# Patient Record
Sex: Female | Born: 1940
Health system: Southern US, Community
[De-identification: ages and names within clinical notes are randomized; demographics above are authoritative.]

## PROBLEM LIST (undated history)

## (undated) DIAGNOSIS — K219 Gastro-esophageal reflux disease without esophagitis: Secondary | ICD-10-CM

## (undated) DIAGNOSIS — N182 Chronic kidney disease, stage 2 (mild): Secondary | ICD-10-CM

## (undated) DIAGNOSIS — E119 Type 2 diabetes mellitus without complications: Secondary | ICD-10-CM

## (undated) DIAGNOSIS — R42 Dizziness and giddiness: Secondary | ICD-10-CM

## (undated) DIAGNOSIS — T8450XA Infection and inflammatory reaction due to unspecified internal joint prosthesis, initial encounter: Secondary | ICD-10-CM

## (undated) DIAGNOSIS — E669 Obesity, unspecified: Secondary | ICD-10-CM

## (undated) DIAGNOSIS — Z8639 Personal history of other endocrine, nutritional and metabolic disease: Secondary | ICD-10-CM

## (undated) DIAGNOSIS — M1990 Unspecified osteoarthritis, unspecified site: Secondary | ICD-10-CM

## (undated) DIAGNOSIS — I10 Essential (primary) hypertension: Secondary | ICD-10-CM

## (undated) DIAGNOSIS — K210 Gastro-esophageal reflux disease with esophagitis: Secondary | ICD-10-CM

## (undated) DIAGNOSIS — E785 Hyperlipidemia, unspecified: Secondary | ICD-10-CM

## (undated) DIAGNOSIS — M810 Age-related osteoporosis without current pathological fracture: Secondary | ICD-10-CM

## (undated) HISTORY — PX: EYE SURGERY: SHX253

## (undated) HISTORY — PX: COLONOSCOPY: SHX174

## (undated) HISTORY — DX: Obesity, unspecified: E66.9

## (undated) HISTORY — PX: JOINT REPLACEMENT: SHX530

## (undated) HISTORY — DX: Dizziness and giddiness: R42

## (undated) HISTORY — DX: Essential (primary) hypertension: I10

## (undated) HISTORY — DX: Gastro-esophageal reflux disease with esophagitis: K21.0

## (undated) HISTORY — DX: Hyperlipidemia, unspecified: E78.5

---

## 1998-12-31 ENCOUNTER — Encounter: Payer: Self-pay | Admitting: Gastroenterology

## 1998-12-31 ENCOUNTER — Encounter: Admission: RE | Admit: 1998-12-31 | Discharge: 1998-12-31 | Payer: Self-pay | Admitting: Gastroenterology

## 1999-03-18 ENCOUNTER — Encounter: Admission: RE | Admit: 1999-03-18 | Discharge: 1999-03-18 | Payer: Self-pay | Admitting: Obstetrics and Gynecology

## 1999-03-18 ENCOUNTER — Encounter: Payer: Self-pay | Admitting: Obstetrics and Gynecology

## 1999-03-19 ENCOUNTER — Other Ambulatory Visit: Admission: RE | Admit: 1999-03-19 | Discharge: 1999-03-19 | Payer: Self-pay | Admitting: Obstetrics and Gynecology

## 2000-03-19 ENCOUNTER — Encounter: Admission: RE | Admit: 2000-03-19 | Discharge: 2000-03-19 | Payer: Self-pay | Admitting: Obstetrics and Gynecology

## 2000-03-19 ENCOUNTER — Encounter: Payer: Self-pay | Admitting: Obstetrics and Gynecology

## 2000-04-08 ENCOUNTER — Other Ambulatory Visit: Admission: RE | Admit: 2000-04-08 | Discharge: 2000-04-08 | Payer: Self-pay | Admitting: Obstetrics and Gynecology

## 2000-12-07 ENCOUNTER — Ambulatory Visit (HOSPITAL_BASED_OUTPATIENT_CLINIC_OR_DEPARTMENT_OTHER): Admission: RE | Admit: 2000-12-07 | Discharge: 2000-12-07 | Payer: Self-pay | Admitting: General Surgery

## 2001-03-22 ENCOUNTER — Encounter: Admission: RE | Admit: 2001-03-22 | Discharge: 2001-03-22 | Payer: Self-pay | Admitting: Obstetrics and Gynecology

## 2001-03-22 ENCOUNTER — Encounter: Payer: Self-pay | Admitting: Obstetrics and Gynecology

## 2001-04-12 ENCOUNTER — Other Ambulatory Visit: Admission: RE | Admit: 2001-04-12 | Discharge: 2001-04-12 | Payer: Self-pay | Admitting: Obstetrics and Gynecology

## 2001-05-04 ENCOUNTER — Encounter: Admission: RE | Admit: 2001-05-04 | Discharge: 2001-05-04 | Payer: Self-pay | Admitting: Obstetrics and Gynecology

## 2001-05-04 ENCOUNTER — Encounter: Payer: Self-pay | Admitting: Obstetrics and Gynecology

## 2001-07-23 ENCOUNTER — Encounter: Admission: RE | Admit: 2001-07-23 | Discharge: 2001-07-23 | Payer: Self-pay | Admitting: Endocrinology

## 2001-07-23 ENCOUNTER — Encounter: Payer: Self-pay | Admitting: Endocrinology

## 2001-12-15 ENCOUNTER — Encounter: Admission: RE | Admit: 2001-12-15 | Discharge: 2001-12-15 | Payer: Self-pay | Admitting: Endocrinology

## 2001-12-15 ENCOUNTER — Encounter: Payer: Self-pay | Admitting: Endocrinology

## 2002-02-14 ENCOUNTER — Ambulatory Visit (HOSPITAL_COMMUNITY): Admission: RE | Admit: 2002-02-14 | Discharge: 2002-02-14 | Payer: Self-pay | Admitting: Gastroenterology

## 2002-04-13 ENCOUNTER — Encounter: Payer: Self-pay | Admitting: Obstetrics and Gynecology

## 2002-04-13 ENCOUNTER — Encounter: Admission: RE | Admit: 2002-04-13 | Discharge: 2002-04-13 | Payer: Self-pay | Admitting: Obstetrics and Gynecology

## 2002-04-15 ENCOUNTER — Other Ambulatory Visit: Admission: RE | Admit: 2002-04-15 | Discharge: 2002-04-15 | Payer: Self-pay | Admitting: Obstetrics and Gynecology

## 2003-04-28 ENCOUNTER — Encounter: Admission: RE | Admit: 2003-04-28 | Discharge: 2003-04-28 | Payer: Self-pay | Admitting: Obstetrics and Gynecology

## 2003-05-08 ENCOUNTER — Other Ambulatory Visit: Admission: RE | Admit: 2003-05-08 | Discharge: 2003-05-08 | Payer: Self-pay | Admitting: Obstetrics and Gynecology

## 2003-11-09 ENCOUNTER — Encounter: Admission: RE | Admit: 2003-11-09 | Discharge: 2003-11-09 | Payer: Self-pay | Admitting: Gastroenterology

## 2004-02-20 ENCOUNTER — Encounter: Admission: RE | Admit: 2004-02-20 | Discharge: 2004-02-20 | Payer: Self-pay | Admitting: Family Medicine

## 2004-05-28 ENCOUNTER — Encounter: Admission: RE | Admit: 2004-05-28 | Discharge: 2004-05-28 | Payer: Self-pay | Admitting: Obstetrics and Gynecology

## 2004-10-08 ENCOUNTER — Encounter: Admission: RE | Admit: 2004-10-08 | Discharge: 2004-10-08 | Payer: Self-pay | Admitting: Obstetrics and Gynecology

## 2005-05-30 ENCOUNTER — Encounter: Admission: RE | Admit: 2005-05-30 | Discharge: 2005-05-30 | Payer: Self-pay | Admitting: Obstetrics and Gynecology

## 2006-06-17 ENCOUNTER — Encounter: Admission: RE | Admit: 2006-06-17 | Discharge: 2006-06-17 | Payer: Self-pay | Admitting: Obstetrics and Gynecology

## 2007-01-18 ENCOUNTER — Encounter: Admission: RE | Admit: 2007-01-18 | Discharge: 2007-01-18 | Payer: Self-pay | Admitting: Sports Medicine

## 2007-02-16 ENCOUNTER — Encounter: Admission: RE | Admit: 2007-02-16 | Discharge: 2007-02-16 | Payer: Self-pay | Admitting: Sports Medicine

## 2007-06-08 ENCOUNTER — Encounter: Admission: RE | Admit: 2007-06-08 | Discharge: 2007-06-08 | Payer: Self-pay | Admitting: Otolaryngology

## 2007-06-18 ENCOUNTER — Encounter: Admission: RE | Admit: 2007-06-18 | Discharge: 2007-06-18 | Payer: Self-pay | Admitting: Obstetrics and Gynecology

## 2007-07-27 ENCOUNTER — Observation Stay (HOSPITAL_COMMUNITY): Admission: EM | Admit: 2007-07-27 | Discharge: 2007-07-30 | Payer: Self-pay | Admitting: Emergency Medicine

## 2007-08-10 ENCOUNTER — Encounter: Admission: RE | Admit: 2007-08-10 | Discharge: 2007-09-06 | Payer: Self-pay | Admitting: Internal Medicine

## 2007-12-08 ENCOUNTER — Encounter: Admission: RE | Admit: 2007-12-08 | Discharge: 2007-12-29 | Payer: Self-pay | Admitting: Family Medicine

## 2008-01-14 ENCOUNTER — Encounter: Admission: RE | Admit: 2008-01-14 | Discharge: 2008-03-07 | Payer: Self-pay | Admitting: Family Medicine

## 2008-06-28 ENCOUNTER — Encounter: Admission: RE | Admit: 2008-06-28 | Discharge: 2008-06-28 | Payer: Self-pay | Admitting: Obstetrics and Gynecology

## 2009-07-05 ENCOUNTER — Encounter: Admission: RE | Admit: 2009-07-05 | Discharge: 2009-07-05 | Payer: Self-pay | Admitting: Obstetrics and Gynecology

## 2009-12-30 ENCOUNTER — Encounter: Admission: RE | Admit: 2009-12-30 | Discharge: 2009-12-30 | Payer: Self-pay | Admitting: Sports Medicine

## 2010-01-09 ENCOUNTER — Encounter: Admission: RE | Admit: 2010-01-09 | Discharge: 2010-01-09 | Payer: Self-pay | Admitting: Sports Medicine

## 2010-03-30 ENCOUNTER — Encounter: Payer: Self-pay | Admitting: Sports Medicine

## 2010-03-31 ENCOUNTER — Encounter: Payer: Self-pay | Admitting: Obstetrics and Gynecology

## 2010-03-31 ENCOUNTER — Encounter: Payer: Self-pay | Admitting: Family Medicine

## 2010-06-13 ENCOUNTER — Other Ambulatory Visit: Payer: Self-pay | Admitting: Obstetrics and Gynecology

## 2010-06-13 DIAGNOSIS — Z1231 Encounter for screening mammogram for malignant neoplasm of breast: Secondary | ICD-10-CM

## 2010-07-11 ENCOUNTER — Ambulatory Visit
Admission: RE | Admit: 2010-07-11 | Discharge: 2010-07-11 | Disposition: A | Discharge status: Home / Self Care | Payer: Medicare Other | Source: Ambulatory Visit | Attending: Obstetrics and Gynecology | Admitting: Obstetrics and Gynecology

## 2010-07-11 DIAGNOSIS — Z1231 Encounter for screening mammogram for malignant neoplasm of breast: Secondary | ICD-10-CM

## 2010-07-23 NOTE — H&P (Signed)
Tina Frank, Tina Frank                 ACCOUNT NO.:  [id number]   MEDICAL RECORD NO.:  [id number]          PATIENT TYPE:  EMS   LOCATION:  ED                           FACILITY:  [facility]   PHYSICIAN:  Hollice Espy, M.D.DATE OF BIRTH:  November 20, 1940   DATE OF ADMISSION:  07/27/2007  DATE OF DISCHARGE:                              HISTORY & PHYSICAL   PRIMARY CARE PHYSICIAN:  Chales Salmon. Abigail Miyamoto, M.D.   CHIEF COMPLAINT:  Vertigo.   HISTORY OF PRESENT ILLNESS:  The patient is a 69 year old white female  with past medical history of diabetes and hypertension who woke up this  morning with severe vertigo. She felt like every time she opened her  eyes her room was spinning. She could barely stand let alone walk and  her symptoms did not resolve. She called paramedics and was brought into  the emergency room. In the emergency room, the patient had a BMET done  which was found to be unremarkable. She underwent an MRI of the head  which had scattered white matter hyperintensities bilaterally most  likely due to chronic ischemia but no evidence of any acute  abnormalities. She was treated with multiple doses of diazepam, Antivert  and Zofran, but while this did help a little bit with her symptoms, she  still noted that she still had some vertigo especially when she looked  to the left. With continuance of her symptoms, it was felt best that she  come in for further observation. When I saw the patient, she was  complaining of some vertigo especially when she looked to the left. She  denied any headaches or no dysphagia. No chest pain, palpitations,  shortness of breath, wheeze, cough, no abdominal pain. No hematuria,  dysuria, constipation, diarrhea. No focal extremity numbness, weakness  or pain. Review of systems otherwise negative.   PAST MEDICAL HISTORY:  1. Diabetes.  2. Hypertension.  3. Hyperlipidemia.  4. Gastroesophageal reflux disease.  5. She also has a recent urinary tract  infection.   MEDICATIONS:  1. Cipro 500 b.i.d.  2. Nexium 40.  3. Multivitamin daily.  4. Vytorin 10/40.  5. Benicar 40/25.  6. Metformin 500.   SHE HAS ALLERGY TO SULFA.   SOCIAL HISTORY:  No tobacco, alcohol or drug use.   FAMILY HISTORY:  Noncontributory.   PHYSICAL EXAMINATION:  The patient's vitals on admission:  HEENT: Is normocephalic, atraumatic. Her mucous membranes are slightly  dry. Cranial nerves II-XII are intact. She does have a bit of a left-  sided horizontal nystagmus.  HEART: Regular rate and rhythm, S1, S2.  LUNGS:  Clear to auscultation bilaterally.  ABDOMEN: Soft and nontender, nondistended. Positive bowel sounds.  EXTREMITIES: Show no clubbing, cyanosis or edema.  I attempted to do one round of Dix-Hallpike maneuvers in attempts to  stabilize her vertigo, but this was unsuccessful.   LABORATORY DATA:  Sodium 132, potassium 3.8, chloride 100, bicarb not  done. BUN 19, creatinine 0.7, glucose 185.  MR of the brain shows  scattered white matter hyperintensities bilaterally most likely due to  chronic ischemia. No evidence  of any acute abnormalities.   ASSESSMENT/PLAN:  1. Intractable vertigo. Given the fact of onset of symptoms, normal      MRI, stable labs and vitals, it is likely she has benign positional      vertigo. The plan will be to observe the patient overnight, treat.      Given the fact that she cannot walk or stand on her own without      severe dizziness, will create instructions for the Epley and Semont      positions for vertigo and will have nursing staff treat with IV      Antivert and positioning multiple times until her symptoms have      resolved.  2. Diabetes mellitus.  Continue metformin and sliding scale.  3. Hypertension. Continue Benicar.  4. Gastroesophageal reflux disease. Continue Nexium.      Hollice Espy, M.D.  Electronically Signed     SKK/MEDQ  D:  07/27/2007  T:  07/27/2007  Job:  [id number]   cc:   Chales Salmon.  Abigail Miyamoto, M.D.  Fax: [phone]

## 2010-07-26 NOTE — Op Note (Signed)
   Tina Frank, Tina Frank                           ACCOUNT NO.:  [id number]   MEDICAL RECORD NO.:  [id number]                   PATIENT TYPE:  AMB   LOCATION:  ENDO                                 FACILITY:  [facility]   PHYSICIAN:  Petra Kuba, M.D.                 DATE OF BIRTH:  [birth year]   DATE OF PROCEDURE:  DATE OF DISCHARGE:                                 OPERATIVE REPORT   PROCEDURE:  Esophagogastroduodenoscopy.   INDICATIONS FOR PROCEDURE:  Upper tract symptoms.  Consent was signed after  risks, benefits,  methods and options were thoroughly discussed in the  office.   MEDICATIONS:  No additional medicines were used.   PROCEDURE IN DETAIL:  The video endoscope was inserted by direct vision.  The proximal, mid and distal esophagus were normal.  The distal esophagus  had a tiny to small hiatal hernia.  Scope passed easily in the stomach and  advanced to the antrum pertinent for some linear antritis.  Advanced through  a normal pylorus into a normal duodenal bulb and on to the cecum to a normal  second portion of the duodenum.  The scope was withdrawn back to the bulb  and a good look there ruled out ulcer in that location. The scope was  withdrawn back to the stomach and retroflexed.  The angularis, cardia and  fundus were normal except for the hiatal hernia being confirmed in the  cardia.  The lesser and greater curve were evaluated on retroflexion and  straight visualization with evidence of minimal gastritis.  No other  abnormalities were seen.  Air was suctioned.  Scope was slowly withdrawn.  Again, a good look at the esophagus was normal.  Scope was removed.  The  patient tolerated the procedure well.  There was no obvious immediate  complication.   ENDOSCOPIC DIAGNOSES:  1. Small hiatal hernia.  2. Minimal antritis/gastritis.  3. Otherwise normal esophagogastroduodenoscopy.   PLAN:  Continue pump inhibitors.  I will see back p.r.n., otherwise, see  colonoscopic  dictation for further for further workup, plans and future  screening.                                               Petra Kuba, M.D.    MEM/MEDQ  D:  02/11/2002  T:  02/12/2002  Job:  [id number]   cc:   Gregary Signs A. Everardo All, M.D. [facility]

## 2010-07-26 NOTE — Op Note (Signed)
   NAMEIZZABELLA, Tina Frank                           ACCOUNT NO.:  [id number]   MEDICAL RECORD NO.:  [id number]                   PATIENT TYPE:  AMB   LOCATION:  ENDO                                 FACILITY:  [facility]   PHYSICIAN:  Petra Kuba, M.D.                 DATE OF BIRTH:  November 05, 1940   DATE OF PROCEDURE:  02/11/2002  DATE OF DISCHARGE:                                 OPERATIVE REPORT   PROCEDURE:  Colonoscopy.   INDICATIONS:  Screening. Consent was signed after risks, benefits, methods,  and options were thoroughly discussed in the office. Consent was signed  prior to any premedications given after risks, benefits, methods, and  options were thoroughly discussed in the office.   MEDICATIONS GIVEN:  Demerol 70 mg, Versed 7.5 mg.   DESCRIPTION OF PROCEDURE:  Rectal inspection is pertinent for external  hemorrhoids, small. Digital exam was negative. The video pediatric  adjustable colonoscope was inserted and advanced around the colon to the  cecum. That required some left lower quadrant pressure but no position  changes. Other than left sided diverticula, no abnormalities were seen on  insertion. The cecum was identified by the appendiceal orifice and ileocecal  valve, in fact, the scope was inserted a short ways into the terminal ileum  which was normal. The scope was slowly withdrawn. The prep was adequate.  There was some liquid still that required washing and suctioning. On slow  withdrawn through the colon other than the left sided diverticula, no other  abnormalities were seen. Once back in the rectum, the scope was retroflexed,  pertinent for some internal hemorrhoids. The scope was straightened and  readvanced a short ways up the left side of the colon, air was suctioned and  the scope removed. The patient tolerated the procedure well. There was no  evidence of any complication.   ENDOSCOPIC DIAGNOSES:  1. Internal and external hemorrhoids.  2. Left sided  diverticula.  3. Otherwise within normal limits to the cecum and terminal ileum.   PLAN:  Continue workup with an EGD. Repeat colon screening 5 to 10 years.  Otherwise return to care of Dr. Everardo All. Further customary health care  measures to include yearly rectals and guaiacs.                                               Petra Kuba, M.D.    MEM/MEDQ  D:  02/11/2002  T:  02/11/2002  Job:  [id number]   cc:   Gregary Signs A. Everardo All, M.D. [facility]

## 2010-12-04 LAB — DIFFERENTIAL
Basophils Relative: 0
Eosinophils Absolute: 0.1
Lymphs Abs: 1.5
Monocytes Absolute: 0.7
Monocytes Relative: 8
Neutrophils Relative %: 74

## 2010-12-04 LAB — BASIC METABOLIC PANEL
BUN: 13
BUN: 14
CO2: 29
Calcium: 10.3
Chloride: 96
Creatinine, Ser: 0.92
Glucose, Bld: 115 — ABNORMAL HIGH
Glucose, Bld: 116 — ABNORMAL HIGH
Potassium: 4.3
Potassium: 4.5
Sodium: 136

## 2010-12-04 LAB — CBC
Hemoglobin: 14.4
MCHC: 33.5
MCV: 85.4
RBC: 5.05
WBC: 8.4

## 2010-12-04 LAB — POCT I-STAT, CHEM 8
BUN: 19
Chloride: 100
Creatinine, Ser: 0.7
Potassium: 3.8
Sodium: 132 — ABNORMAL LOW
TCO2: 20

## 2011-06-20 ENCOUNTER — Other Ambulatory Visit: Payer: Self-pay | Admitting: Obstetrics and Gynecology

## 2011-06-20 DIAGNOSIS — Z1231 Encounter for screening mammogram for malignant neoplasm of breast: Secondary | ICD-10-CM

## 2011-07-15 ENCOUNTER — Ambulatory Visit
Admission: RE | Admit: 2011-07-15 | Discharge: 2011-07-15 | Disposition: A | Discharge status: Home / Self Care | Payer: Medicare Other | Source: Ambulatory Visit | Attending: Obstetrics and Gynecology | Admitting: Obstetrics and Gynecology

## 2011-07-15 DIAGNOSIS — Z1231 Encounter for screening mammogram for malignant neoplasm of breast: Secondary | ICD-10-CM

## 2011-08-06 ENCOUNTER — Encounter: Payer: Self-pay | Admitting: *Deleted

## 2011-08-06 DIAGNOSIS — B029 Zoster without complications: Secondary | ICD-10-CM | POA: Insufficient documentation

## 2011-08-06 DIAGNOSIS — E669 Obesity, unspecified: Secondary | ICD-10-CM | POA: Insufficient documentation

## 2011-10-28 ENCOUNTER — Encounter (HOSPITAL_COMMUNITY): Payer: Self-pay | Admitting: *Deleted

## 2011-10-28 ENCOUNTER — Observation Stay (HOSPITAL_COMMUNITY)
Admission: EM | Admit: 2011-10-28 | Discharge: 2011-10-30 | Disposition: A | Discharge status: Home / Self Care | Payer: Medicare Other | Attending: Family Medicine | Admitting: Family Medicine

## 2011-10-28 DIAGNOSIS — I959 Hypotension, unspecified: Principal | ICD-10-CM | POA: Insufficient documentation

## 2011-10-28 DIAGNOSIS — E119 Type 2 diabetes mellitus without complications: Secondary | ICD-10-CM | POA: Diagnosis present

## 2011-10-28 DIAGNOSIS — R42 Dizziness and giddiness: Secondary | ICD-10-CM | POA: Insufficient documentation

## 2011-10-28 DIAGNOSIS — E669 Obesity, unspecified: Secondary | ICD-10-CM | POA: Insufficient documentation

## 2011-10-28 DIAGNOSIS — Z7982 Long term (current) use of aspirin: Secondary | ICD-10-CM | POA: Insufficient documentation

## 2011-10-28 DIAGNOSIS — I10 Essential (primary) hypertension: Secondary | ICD-10-CM | POA: Insufficient documentation

## 2011-10-28 DIAGNOSIS — Z79899 Other long term (current) drug therapy: Secondary | ICD-10-CM | POA: Insufficient documentation

## 2011-10-28 DIAGNOSIS — I952 Hypotension due to drugs: Secondary | ICD-10-CM

## 2011-10-28 DIAGNOSIS — E785 Hyperlipidemia, unspecified: Secondary | ICD-10-CM | POA: Diagnosis present

## 2011-10-28 DIAGNOSIS — T50904A Poisoning by unspecified drugs, medicaments and biological substances, undetermined, initial encounter: Secondary | ICD-10-CM

## 2011-10-28 DIAGNOSIS — I9589 Other hypotension: Secondary | ICD-10-CM

## 2011-10-28 DIAGNOSIS — B029 Zoster without complications: Secondary | ICD-10-CM

## 2011-10-28 LAB — CBC WITH DIFFERENTIAL/PLATELET
Eosinophils Absolute: 0.2 10*3/uL (ref 0.0–0.7)
Lymphs Abs: 1.5 10*3/uL (ref 0.7–4.0)
MCHC: 35.2 g/dL (ref 30.0–36.0)
MCV: 82.2 fL (ref 78.0–100.0)
Monocytes Relative: 11 % (ref 3–12)
Neutrophils Relative %: 58 % (ref 43–77)
Platelets: 195 10*3/uL (ref 150–400)
RDW: 12.8 % (ref 11.5–15.5)
WBC: 5.4 10*3/uL (ref 4.0–10.5)

## 2011-10-28 LAB — COMPREHENSIVE METABOLIC PANEL
Alkaline Phosphatase: 84 U/L (ref 39–117)
BUN: 16 mg/dL (ref 6–23)
Chloride: 100 mEq/L (ref 96–112)
GFR calc Af Amer: >90 mL/min (ref >90)
Glucose, Bld: 127 mg/dL — ABNORMAL HIGH (ref 70–99)
Potassium: 3.3 mEq/L — ABNORMAL LOW (ref 3.5–5.1)
Total Bilirubin: 0.6 mg/dL (ref 0.3–1.2)
Total Protein: 6.6 g/dL (ref 6.0–8.3)

## 2011-10-28 MED ORDER — POTASSIUM CHLORIDE CRYS ER 20 MEQ PO TBCR
40.0000 meq | EXTENDED_RELEASE_TABLET | Freq: Once | ORAL | Status: AC
  Administered 2011-10-28: 40 meq via ORAL
  Filled 2011-10-28: qty 2

## 2011-10-28 MED ORDER — AZITHROMYCIN 250 MG PO TABS
250.0000 mg | ORAL_TABLET | Freq: Every day | ORAL | Status: DC
  Administered 2011-10-29 – 2011-10-30 (×2): 250 mg via ORAL
  Filled 2011-10-28 (×2): qty 1

## 2011-10-28 MED ORDER — INSULIN ASPART 100 UNIT/ML ~~LOC~~ SOLN
0.0000 [IU] | Freq: Three times a day (TID) | SUBCUTANEOUS | Status: DC
  Administered 2011-10-29 – 2011-10-30 (×2): 2 [IU] via SUBCUTANEOUS

## 2011-10-28 MED ORDER — METFORMIN HCL 500 MG PO TABS
500.0000 mg | ORAL_TABLET | Freq: Every day | ORAL | Status: DC
  Administered 2011-10-29 – 2011-10-30 (×2): 500 mg via ORAL
  Filled 2011-10-28 (×3): qty 1

## 2011-10-28 MED ORDER — ASPIRIN 325 MG PO TABS
325.0000 mg | ORAL_TABLET | Freq: Every day | ORAL | Status: DC
  Administered 2011-10-29 – 2011-10-30 (×2): 325 mg via ORAL
  Filled 2011-10-28 (×2): qty 1

## 2011-10-28 MED ORDER — ATORVASTATIN CALCIUM 10 MG PO TABS
10.0000 mg | ORAL_TABLET | Freq: Every day | ORAL | Status: DC
  Administered 2011-10-29: 10 mg via ORAL
  Filled 2011-10-28 (×2): qty 1

## 2011-10-28 MED ORDER — CARVEDILOL 12.5 MG PO TABS
12.5000 mg | ORAL_TABLET | Freq: Two times a day (BID) | ORAL | Status: DC
Start: 2011-10-29 — End: 2011-10-29
  Administered 2011-10-29: 12.5 mg via ORAL
  Filled 2011-10-28 (×4): qty 1

## 2011-10-28 MED ORDER — ENOXAPARIN SODIUM 40 MG/0.4ML ~~LOC~~ SOLN
40.0000 mg | SUBCUTANEOUS | Status: DC
  Administered 2011-10-29 – 2011-10-30 (×2): 40 mg via SUBCUTANEOUS
  Filled 2011-10-28 (×2): qty 0.4

## 2011-10-28 MED ORDER — SODIUM CHLORIDE 0.9 % IV SOLN
INTRAVENOUS | Status: DC
  Administered 2011-10-29 – 2011-10-30 (×4): via INTRAVENOUS

## 2011-10-28 MED ORDER — SODIUM CHLORIDE 0.9 % IV BOLUS (SEPSIS)
500.0000 mL | Freq: Once | INTRAVENOUS | Status: AC
  Administered 2011-10-28: 500 mL via INTRAVENOUS

## 2011-10-28 MED ORDER — SODIUM CHLORIDE 0.9 % IJ SOLN: 3.0000 mL | Freq: Two times a day (BID) | INTRAMUSCULAR | Status: DC

## 2011-10-28 NOTE — ED Notes (Signed)
Pt has been having episodes of htn today, MD suggested she come to the ED, at 1715 took bp meds, on arrival bp 112/72

## 2011-10-28 NOTE — ED Notes (Signed)
Attempted to call report, awaiting rn to return call 

## 2011-10-28 NOTE — Progress Notes (Signed)
Attempted to call RN in ED for report, RN unable to answer phone. Will await callback.  Itali Mckendry, Ok Edwards

## 2011-10-28 NOTE — ED Notes (Signed)
MD at bedside.  hospitalist 

## 2011-10-28 NOTE — ED Provider Notes (Addendum)
History     CSN: [id number]  Arrival date & time 10/28/11  1739   First MD Initiated Contact with Patient 10/28/11 1852      Chief Complaint  Patient presents with  . Hypertension    HPI Pt has history of hypertension.  This past weekend she had sore throat, ear drainage and congestion so she was prescribed z pack by an UC doctor.  This am her BP was 185/85.  She took her regular medications as well as a clonidine that she takes prn.  Pt called her doctor because it was elevated at 184 and was told to get her kidney function checked.  Pt has not had to take an extra clonidine for a while.  No chest pain , no fever.  She has had some chills.  No vomiting or diarrhea.  She now feels lightheaded when she stands.  Past Medical History  Diagnosis Date  . Obesity   . Hypertension   . Diabetes mellitus   . Hyperlipidemia   . Reflux esophagitis   . Herpes zoster   . Shingles     History reviewed. No pertinent past surgical history.  Family History  Problem Relation Age of Onset  . Heart failure Mother   . Cancer Mother   . Cancer Father     History  Substance Use Topics  . Smoking status: Former Games developer  . Smokeless tobacco: Not on file  . Alcohol Use: No    OB History    Grav Para Term Preterm Abortions TAB SAB Ect Mult Living                  Review of Systems  Cardiovascular: Negative for chest pain.  All other systems reviewed and are negative.    Allergies  Crestor; Norvasc; Simvastatin; Zetia; and Sulfa antibiotics  Home Medications   Current Outpatient Rx  Name Route Sig Dispense Refill  . ASPIRIN 325 MG PO TABS Oral Take 325 mg by mouth daily.    . ATORVASTATIN CALCIUM 10 MG PO TABS Oral Take 10 mg by mouth daily.    . AZITHROMYCIN 250 MG PO TABS Oral Take 250-500 mg by mouth daily. Started 10/26/11, 5 day course of therapy.    Marland Kitchen CARVEDILOL 12.5 MG PO TABS Oral Take 12.5 mg by mouth 2 (two) times daily with a meal.    . CLONIDINE HCL 0.2 MG PO TABS Oral  Take 0.2 mg by mouth 2 (two) times daily as needed. qhs and can take an extra dose daily if needed for high blood pressure.    . FUROSEMIDE 40 MG PO TABS Oral Take 40 mg by mouth 2 (two) times daily.    Marland Kitchen HYDRALAZINE HCL 50 MG PO TABS Oral Take 50 mg by mouth 2 (two) times daily.    Marland Kitchen METFORMIN HCL 500 MG PO TABS Oral Take 500 mg by mouth daily.    . ADULT MULTIVITAMIN W/MINERALS CH Oral Take 1 tablet by mouth daily.    . TELMISARTAN 40 MG PO TABS Oral Take 40 mg by mouth 2 (two) times daily.       BP 81/47  Pulse 69  Temp 98.7 F (37.1 C) (Oral)  Resp 15  SpO2 95%  Physical Exam  Nursing note and vitals reviewed. Constitutional: She appears well-developed and well-nourished. No distress.  HENT:  Head: Normocephalic and atraumatic.  Right Ear: External ear normal.  Left Ear: External ear normal.  Eyes: Conjunctivae are normal. Right eye exhibits no  discharge. Left eye exhibits no discharge. No scleral icterus.  Neck: Neck supple. No tracheal deviation present.  Cardiovascular: Normal rate, regular rhythm and intact distal pulses.   Pulmonary/Chest: Effort normal and breath sounds normal. No stridor. No respiratory distress. She has no wheezes. She has no rales.  Abdominal: Soft. Bowel sounds are normal. She exhibits no distension. There is no tenderness. There is no rebound and no guarding.  Musculoskeletal: She exhibits no edema and no tenderness.  Neurological: She is alert. She has normal strength. No sensory deficit. Cranial nerve deficit:  no gross defecits noted. She exhibits normal muscle tone. She displays no seizure activity. Coordination normal.  Skin: Skin is warm and dry. No rash noted.  Psychiatric: She has a normal mood and affect.    ED Course  Procedures (including critical care time)  Rate: 53  Rhythm: normal sinus rhythm  QRS Axis: normal  Intervals: normal  ST/T Wave abnormalities: normal  Conduction Disutrbances:none  Narrative Interpretation:   Old EKG  Reviewed: none available  Labs Reviewed  COMPREHENSIVE METABOLIC PANEL - Abnormal; Notable for the following:    Potassium 3.3 (*)     Glucose, Bld 127 (*)     GFR calc non Af Amer 83 (*)     All other components within normal limits  CBC WITH DIFFERENTIAL      1. Hypotension due to drugs       MDM  At bedside, BP is 97/54.  Pt is asymptomatic.  Will monitor.  The patient's laboratory tests are unremarkable. She is asymptomatic and  clearly is currently not hypertensive but has a borderline lower blood pressure with the additional blood pressure doses that she's taken today.  Pt has remained orthostatic.  Likely related to her additional bp meds.  No other symptoms of chest pain, signs of infection or anemia, renal failure.  Will consult medicine for admission for observation.        Celene Kras, MD 10/28/11 2117  Celene Kras, MD 10/28/11 2220

## 2011-10-28 NOTE — ED Notes (Signed)
Family at bedside. 

## 2011-10-29 DIAGNOSIS — I9589 Other hypotension: Secondary | ICD-10-CM

## 2011-10-29 LAB — BASIC METABOLIC PANEL
Calcium: 9.8 mg/dL (ref 8.4–10.5)
Chloride: 104 mEq/L (ref 96–112)
Creatinine, Ser: 0.78 mg/dL (ref 0.50–1.10)
GFR calc Af Amer: >90 mL/min (ref >90)
Sodium: 137 mEq/L (ref 135–145)

## 2011-10-29 LAB — CBC
Platelets: 172 10*3/uL (ref 150–400)
RDW: 12.9 % (ref 11.5–15.5)
WBC: 6.3 10*3/uL (ref 4.0–10.5)

## 2011-10-29 LAB — GLUCOSE, CAPILLARY: Glucose-Capillary: 97 mg/dL (ref 70–99)

## 2011-10-29 MED ORDER — HYDRALAZINE HCL 50 MG PO TABS
50.0000 mg | ORAL_TABLET | Freq: Two times a day (BID) | ORAL | Status: DC
  Administered 2011-10-29 – 2011-10-30 (×2): 50 mg via ORAL
  Filled 2011-10-29 (×3): qty 1

## 2011-10-29 MED ORDER — CARVEDILOL 6.25 MG PO TABS
6.2500 mg | ORAL_TABLET | Freq: Two times a day (BID) | ORAL | Status: DC
  Filled 2011-10-29 (×4): qty 1

## 2011-10-29 MED ORDER — CLONIDINE HCL 0.1 MG PO TABS
0.1000 mg | ORAL_TABLET | Freq: Every day | ORAL | Status: DC
  Administered 2011-10-29: 0.1 mg via ORAL
  Filled 2011-10-29 (×2): qty 1

## 2011-10-29 NOTE — Progress Notes (Signed)
Upon arrival to the floor, pt's HR was sustaining in the 50's. ED RN and pt reported pt's HR had been in the 50's while there, occasionally dropping lower. No mention of this in Hospitalist's H&P. Through the night while pt resting HR sustained in the 40/50's occasionally dropping into the 30's. VSS. Asymptomatic. When pt awakened HR still remains in the 50's. Paged on call physician with above info. No new orders received. Will continue to monitor pt closely.   Arvel Oquinn, Ok Edwards RN

## 2011-10-29 NOTE — Progress Notes (Signed)
3:44 PM I agree with HPI/GPe and A/P per Dr. Conley Rolls       Patient has very difficult to control blood pressure and actually follows with nephrologist Dr. Briant Cedar. She states that for the past 2-3 nights she has been taking by mouth clonidine twice at night and that is what potentially precipitated her lightheadedness and presyncopal episodes. Her home medication regimen includes Coreg 12.5 twice a day with a meal Clonidine 0.2 mg 2 times daily as needed each bedtime and can take an extra dose if needed 5 blood pressure, hydralazine 50 mg twice a day Lasix 40 mg twice daily (this is a lower strength is swelling caused by the hydralazine) and candesartan 40 mg 2 tabs by mouth daily.  HEENT-alert oriented pleasant CHEST-clinically clear no added sound CARDIAC-S1-S2 no murmur rub or gallop-telemetry = sinus bradycardia into the 40s 50s overnight. Orthostatics were not ordered on admission ABDOMEN-soft nontender nondistended no rebound or guarding NEURO- no focal deficits noted, motor grossly intact SKIN/MUSCULAR  Patient Active Problem List  Diagnosis  . Obesity  . Reflux esophagitis  . Herpes zoster  . Shingles  . Hypotension (arterial)  . HTN (hypertension) with goal to be determined  . DM (diabetes mellitus)  . Hyperlipidemia   Severe orthostasis with possible supine hypertension orthostatic hypotension secondary to either #1 inch intake cardiogenic issues versus #2 polypharmacy-I am more inclined to think this is secondary to multiple medications. I have ordered orthostatic vital signs every shift, we will cut back her Coreg to 12.5 twice a day to 6.25 mg twice a day, will reimplement hydralazine 50 mg twice a day (first dose now) and will ? Order HCTZ in addition to a lower dose of her micardis  40 mg which probably change to 20 mg on discharge. I've discussed in detail with the patient that these medications need to be checked fine-tuned and it is not possible to do this as an outpatient  and would prefer not to given the fact that she still feels orthostatic and woozy when standing. Her blood sugars well controlled we will continue her other regular medications I will reimplement clonidine at 0.1 mg each bedtime scheduled She will need close outpatient followup with Dr. Briant Cedar, who I will contact if this recurs over the course of today and tomorrow  Pleas Koch, MD [facility] (P) [phone]

## 2011-10-29 NOTE — Progress Notes (Signed)
   CARE MANAGEMENT NOTE 10/29/2011  Patient:  Tina Frank, Tina Frank   Account Number:  [id number]  Date Initiated:  10/29/2011  Documentation initiated by:  Jiles Crocker  Subjective/Objective Assessment:   ADMITTED WITH DIZZINESS     Action/Plan:   PCP:   Gaye Alken, MD  LIVES ALONE, INDEPENDENT PRIOR TO ADMISSION   Anticipated DC Date:  10/29/2011   Anticipated DC Plan:  HOME/SELF CARE      DC Planning Services  CM consult          Status of service:  In process, will continue to follow Medicare Important Message given?  NA - LOS <3 / Initial given by admissions (If response is "NO", the following Medicare IM given date fields will be blank)  Per UR Regulation:  Reviewed for med. necessity/level of care/duration of stay  Comments:  10/29/2011- B Mamadou Breon RN, BSN, MHA

## 2011-10-29 NOTE — H&P (Signed)
[facility] History and Physical  Tina Frank ZOX:[id number] DOB: 31-May-1940    PCP:   Gaye Alken, MD   Chief Complaint: Dizziness  HPI: Tina Frank is an 71 y.o. female  With hx of severe labile HTN, on Coreg, Micardis, Hydralazine, and Clonodine 0.2 mg, DM, Hyperlipidemia, found her SBP to be 220, so she took an extra Clonidine pill as instructed by her MD as PRN use.  Subsequently, she felt lightheadedness and presents to the ER as recommended by her physician.  In the ER, her labs were rather unremarkable, but she has severe orthostasis with SBP 80 when standing up.  She also has lightheadedness.  EKG was SR with no ischemic changes.  After receiving IVF in the ER, and watch for 4 hours, hospitalist was asked to admit her for obs.  Other than the lightheadedness, she is asymptomatic.  Rewiew of Systems:  Constitutional: Negative for malaise, fever and chills. No significant weight loss or weight gain Eyes: Negative for eye pain, redness and discharge, diplopia, visual changes, or flashes of light. ENMT: Negative for ear pain, hoarseness, nasal congestion, sinus pressure and sore throat. No headaches; tinnitus, drooling, or problem swallowing. Cardiovascular: Negative for chest pain, palpitations, diaphoresis, dyspnea and peripheral edema. ; No orthopnea, PND Respiratory: Negative for cough, hemoptysis, wheezing and stridor. No pleuritic chestpain. Gastrointestinal: Negative for nausea, vomiting, diarrhea, constipation, abdominal pain, melena, blood in stool, hematemesis, jaundice and rectal bleeding.    Genitourinary: Negative for frequency, dysuria, incontinence,flank pain and hematuria; Musculoskeletal: Negative for back pain and neck pain. Negative for swelling and trauma.;  Skin: . Negative for pruritus, rash, abrasions, bruising and skin lesion.; ulcerations Neuro: Negative for headache,  and neck stiffness. Negative for weakness, altered level of consciousness  , altered mental status, extremity weakness, burning feet, involuntary movement, seizure and syncope.  Psych: negative for anxiety, depression, insomnia, tearfulness, panic attacks, hallucinations, paranoia, suicidal or homicidal ideation    Past Medical History  Diagnosis Date  . Obesity   . Hypertension   . Diabetes mellitus   . Hyperlipidemia   . Reflux esophagitis   . Herpes zoster   . Shingles     pt states she didn't have this on 10/28/11    Past Surgical History  Procedure Date  . No past surgeries     Medications:  HOME MEDS: Prior to Admission medications   Medication Sig Start Date End Date Taking? Authorizing Provider  aspirin 325 MG tablet Take 325 mg by mouth daily.   Yes Historical Provider, MD  atorvastatin (LIPITOR) 10 MG tablet Take 10 mg by mouth daily.   Yes Historical Provider, MD  azithromycin (ZITHROMAX) 250 MG tablet Take 250-500 mg by mouth daily. Started 10/26/11, 5 day course of therapy.   Yes Historical Provider, MD  carvedilol (COREG) 12.5 MG tablet Take 12.5 mg by mouth 2 (two) times daily with a meal.   Yes Historical Provider, MD  cloNIDine (CATAPRES) 0.2 MG tablet Take 0.2 mg by mouth 2 (two) times daily as needed. qhs and can take an extra dose daily if needed for high blood pressure.   Yes Historical Provider, MD  furosemide (LASIX) 40 MG tablet Take 40 mg by mouth 2 (two) times daily.   Yes Historical Provider, MD  hydrALAZINE (APRESOLINE) 50 MG tablet Take 50 mg by mouth 2 (two) times daily.   Yes Historical Provider, MD  metFORMIN (GLUCOPHAGE) 500 MG tablet Take 500 mg by mouth daily.   Yes Historical Provider, MD  Multiple Vitamin (MULTIVITAMIN WITH MINERALS) TABS Take 1 tablet by mouth daily.   Yes Historical Provider, MD  telmisartan (MICARDIS) 40 MG tablet Take 40 mg by mouth 2 (two) times daily.    Yes Historical Provider, MD     Allergies:  Allergies  Allergen Reactions  . Crestor (Rosuvastatin)     Feels "achey".  . Norvasc  (Amlodipine Besylate) Swelling  . Simvastatin   . Zetia (Ezetimibe)   . Sulfa Antibiotics Rash    Social History:   reports that she quit smoking about 24 years ago. She has never used smokeless tobacco. She reports that she does not drink alcohol or use illicit drugs.  Family History: Family History  Problem Relation Age of Onset  . Heart failure Mother   . Cancer Mother   . Cancer Father      Physical Exam: Filed Vitals:   10/28/11 2012 10/28/11 2015 10/28/11 2206 10/28/11 2348  BP: 97/52 85/50 141/71 176/78  Pulse: 62 58 51 54  Temp:    97.9 F (36.6 C)  TempSrc:    Oral  Resp:   16 18  Height:    5\' 5"  (1.651 m)  Weight:    69.083 kg (152 lb 4.8 oz)  SpO2:   100% 97%   Blood pressure 176/78, pulse 54, temperature 97.9 F (36.6 C), temperature source Oral, resp. rate 18, height 5\' 5"  (1.651 m), weight 69.083 kg (152 lb 4.8 oz), SpO2 97.00%.  GEN:  Pleasant patient lying in the stretcher in no acute distress; cooperative with exam. PSYCH:  alert and oriented x4; does not appear anxious or depressed; affect is appropriate. HEENT: Mucous membranes pink and anicteric; PERRLA; EOM intact; no cervical lymphadenopathy nor thyromegaly or carotid bruit; no JVD; There were no stridor. Neck is very supple. Breasts:: Not examined CHEST WALL: No tenderness CHEST: Normal respiration, clear to auscultation bilaterally.  HEART: Regular rate and rhythm.  There are no murmur, rub, or gallops.   BACK: No kyphosis or scoliosis; no CVA tenderness ABDOMEN: soft and non-tender; no masses, no organomegaly, normal abdominal bowel sounds; no pannus; no intertriginous candida. There is no rebound and no distention. Rectal Exam: Not done EXTREMITIES: No bone or joint deformity; age-appropriate arthropathy of the hands and knees; no edema; no ulcerations.  There is no calf tenderness. Genitalia: not examined PULSES: 2+ and symmetric SKIN: Normal hydration no rash or ulceration CNS: Cranial  nerves 2-12 grossly intact no focal lateralizing neurologic deficit.  Speech is fluent; uvula elevated with phonation, facial symmetry and tongue midline. DTR are normal bilaterally, cerebella exam is intact, barbinski is negative and strengths are equaled bilaterally.  No sensory loss.   Labs on Admission:  Basic Metabolic Panel:  Lab 10/28/11 8295  NA 135  K 3.3*  CL 100  CO2 25  GLUCOSE 127*  BUN 16  CREATININE 0.74  CALCIUM 10.1  MG --  PHOS --   Liver Function Tests:  Lab 10/28/11 1845  AST 15  ALT 11  ALKPHOS 84  BILITOT 0.6  PROT 6.6  ALBUMIN 3.6   No results found for this basename: LIPASE:5,AMYLASE:5 in the last 168 hours No results found for this basename: AMMONIA:5 in the last 168 hours CBC:  Lab 10/28/11 1845  WBC 5.4  NEUTROABS 3.2  HGB 14.1  HCT 40.1  MCV 82.2  PLT 195   Cardiac Enzymes: No results found for this basename: CKTOTAL:5,CKMB:5,CKMBINDEX:5,TROPONINI:5 in the last 168 hours  CBG:  Lab 10/29/11 0004  GLUCAP  112*     Radiological Exams on Admission: No results found.  EKG: Independently reviewed. SR with no acute ST-T changes.   Assessment/Plan Present on Admission:  .Hypotension (arterial) .HTN (hypertension) with goal to be determined .DM (diabetes mellitus) .Hyperlipidemia   PLAN:  Will admit for obs.  I have held most of her HTN except for the Coreg.  Expect severe rebound HTN in the early morning.  If her SBP is greater than 180, we should resume her HTN in a progressive manner.  Her HTN is labile, and has been difficult to control.  She also has allery to Norvasc and has skin reaction to transdermal clonidine.  We will just have to see how her BP is doing.  For her DM, will continue her Metformin and use SSI as necessary.  She is stable, full code, and will be admitted OBS under [facility] service. Plan discussed with her and her son at her bedside.  They both agreed.  Other plans as per orders.  Code Status: FULL  Unk Lightning, MD. [facility] Pager [phone] 7pm to 7am.  10/29/2011, 12:17 AM

## 2011-10-29 NOTE — Progress Notes (Signed)
Patients heart rate is 58 (radial pulse). Coreg scheduled at 1700. MD notified and plans are to hold this dose. Will continue to monitor patient. Setzer, Don Broach

## 2011-10-30 DIAGNOSIS — E119 Type 2 diabetes mellitus without complications: Secondary | ICD-10-CM

## 2011-10-30 DIAGNOSIS — I959 Hypotension, unspecified: Secondary | ICD-10-CM

## 2011-10-30 LAB — GLUCOSE, CAPILLARY: Glucose-Capillary: 153 mg/dL — ABNORMAL HIGH (ref 70–99)

## 2011-10-30 MED ORDER — HYDROCHLOROTHIAZIDE 12.5 MG PO CAPS: 12.5000 mg | ORAL_CAPSULE | Freq: Every day | ORAL | Status: DC

## 2011-10-30 MED ORDER — HYDROCHLOROTHIAZIDE 12.5 MG PO CAPS
12.5000 mg | ORAL_CAPSULE | Freq: Every day | ORAL | Status: DC
  Administered 2011-10-30: 12.5 mg via ORAL
  Filled 2011-10-30: qty 1

## 2011-10-30 MED ORDER — CLONIDINE HCL 0.1 MG PO TABS: 0.1000 mg | ORAL_TABLET | Freq: Every day | ORAL | Status: AC

## 2011-10-30 MED ORDER — HYDRALAZINE HCL 50 MG PO TABS
50.0000 mg | ORAL_TABLET | Freq: Once | ORAL | Status: AC
  Administered 2011-10-30: 50 mg via ORAL
  Filled 2011-10-30: qty 1

## 2011-10-30 NOTE — Progress Notes (Signed)
Patient discharge, alert and oriented, no complaints of any pain or discomfort, son at bedside. PIV removed no s/s of infiltration or swelling. D/C instructions and follow up appointment  done and given to the patient , verbalized understanding.

## 2011-10-30 NOTE — Discharge Summary (Signed)
Physician Discharge Summary  Tina Frank JXB:[id number] DOB: [birth year] DOA: 10/28/2011  PCP: Gaye Alken, MD  Admit date: 10/28/2011 Discharge date: 10/30/2011  Recommendations for Outpatient Follow-up:  1. Needs follow-up c Dr. Briant Cedar for HTN issues-would prefer blood pressure to be slightly higher than 150/90 given her advanced age 71. Outpatient reassessment of medications as well as functional status  Discharge Diagnoses:  Principal Problem:  *Hypotension (arterial) Active Problems:  HTN (hypertension) with goal to be determined  DM (diabetes mellitus)  Hyperlipidemia   Discharge Condition: Good  Diet recommendation: Heart healthy low card diabetic  Filed Weights   10/28/11 2348 10/30/11 0532  Weight: 69.083 kg (152 lb 4.8 oz) 72.4 kg (159 lb 9.8 oz)    History of present illness:  This pleasant 71 year old female with history of severe labile hypertension on multiple medications presented to the emergency room 10/29/2011 with lightheadedness and severe orthostasis.  She had been on Coreg my card is hydralazine and clonidine 0.2 mg and had been instructed by her nephrologist take an extra clonidine at night if her blood pressure was high.  She was ruled out for acute coronary syndrome and was kept on observation status initially.  Her blood pressure after fine-tuning and adjustment of her blood pressure medications when slightly higher.  I did counsel her that given her increased age her actual JNC goal is not below 140/90 as would be for regular adults rather it would be slightly higher and I would even venture above 160/104 increased cerebral/cardiac perfusion  she did not experience any further orthostatic symptoms or findings.  She ambulated about 400 feet prior to discharge and did not have any oxygen requirements.  Patient will need close followup and outpatient setting to prevent polypharmacy and 4 further fine-tuning of her medications.   Discharge  Exam: Filed Vitals:   10/30/11 1357  BP: 162/77  Pulse: 60  Temp:   Resp:    Filed Vitals:   10/30/11 1215 10/30/11 1355 10/30/11 1356 10/30/11 1357  BP: 158/71 154/75 182/71 162/77  Pulse: 61 68 55 60  Temp: 97.9 F (36.6 C)     TempSrc: Oral     Resp: 16     Height:      Weight:      SpO2: 97%       HEENT-alert oriented pleasant  CHEST-clinically clear no added sound  CARDIAC-S1-S2 no murmur rub or gallop-telemetry = sinus bradycardia into the 40s 50s overnight.  ABDOMEN-soft nontender nondistended no rebound or guarding  NEURO- no focal deficits noted, motor grossly intact  SKIN/MUSCULAR   Discharge Instructions  Discharge Orders    Future Orders Please Complete By Expires   Diet - low sodium heart healthy      Increase activity slowly      Call MD for:      Call MD for:  persistant nausea and vomiting      Call MD for:  redness, tenderness, or signs of infection (pain, swelling, redness, odor or green/yellow discharge around incision site)      Call MD for:  hives      Call MD for:  extreme fatigue        Medication List  As of 10/30/2011  2:16 PM   STOP taking these medications         furosemide 40 MG tablet      telmisartan 40 MG tablet         TAKE these medications  aspirin 325 MG tablet   Take 325 mg by mouth daily.      atorvastatin 10 MG tablet   Commonly known as: LIPITOR   Take 10 mg by mouth daily.      azithromycin 250 MG tablet   Commonly known as: ZITHROMAX   Take 250-500 mg by mouth daily. Started 10/26/11, 5 day course of therapy.      carvedilol 12.5 MG tablet   Commonly known as: COREG   Take 12.5 mg by mouth 2 (two) times daily with a meal.      cloNIDine 0.1 MG tablet   Commonly known as: CATAPRES   Take 1 tablet (0.1 mg total) by mouth at bedtime.      hydrALAZINE 50 MG tablet   Commonly known as: APRESOLINE   Take 50 mg by mouth 2 (two) times daily.      hydrochlorothiazide 12.5 MG capsule   Commonly known as:  MICROZIDE   Take 1 capsule (12.5 mg total) by mouth daily.      metFORMIN 500 MG tablet   Commonly known as: GLUCOPHAGE   Take 500 mg by mouth daily.      multivitamin with minerals Tabs   Take 1 tablet by mouth daily.           Follow-up Information    Call Gaye Alken, MD.   Contact information:   [address] [place] [address] [phone]       Follow up with Dyke Maes, MD. Schedule an appointment as soon as possible for a visit in 2 weeks.   Contact information:   [address] [place] [address] [phone]           The results of significant diagnostics from this hospitalization (including imaging, microbiology, ancillary and laboratory) are listed below for reference.    Significant Diagnostic Studies: No results found.  Microbiology: No results found for this or any previous visit (from the past 240 hour(s)).   Labs: Basic Metabolic Panel:  Lab 10/29/11 5284 10/28/11 1845  NA 137 135  K 3.8 3.3*  CL 104 100  CO2 24 25  GLUCOSE 107* 127*  BUN 16 16  CREATININE 0.78 0.74  CALCIUM 9.8 10.1  MG -- --  PHOS -- --   Liver Function Tests:  Lab 10/28/11 1845  AST 15  ALT 11  ALKPHOS 84  BILITOT 0.6  PROT 6.6  ALBUMIN 3.6   No results found for this basename: LIPASE:5,AMYLASE:5 in the last 168 hours No results found for this basename: AMMONIA:5 in the last 168 hours CBC:  Lab 10/29/11 0453 10/28/11 1845  WBC 6.3 5.4  NEUTROABS -- 3.2  HGB 12.9 14.1  HCT 38.1 40.1  MCV 83.6 82.2  PLT 172 195   Cardiac Enzymes: No results found for this basename: CKTOTAL:5,CKMB:5,CKMBINDEX:5,TROPONINI:5 in the last 168 hours BNP: BNP (last 3 results) No results found for this basename: PROBNP:3 in the last 8760 hours CBG:  Lab 10/30/11 1147 10/30/11 0803 10/29/11 2141 10/29/11 1654 10/29/11 1156  GLUCAP 75 153* 97 102* 117*    Time coordinating discharge: 23 minutes  Signed:  Rhetta Mura  [facility] 10/30/2011, 2:16 PM

## 2012-05-21 ENCOUNTER — Other Ambulatory Visit: Payer: Self-pay

## 2012-05-21 DIAGNOSIS — Z1231 Encounter for screening mammogram for malignant neoplasm of breast: Secondary | ICD-10-CM

## 2012-06-07 ENCOUNTER — Other Ambulatory Visit: Payer: Self-pay | Admitting: Gastroenterology

## 2012-06-07 DIAGNOSIS — R109 Unspecified abdominal pain: Secondary | ICD-10-CM

## 2012-06-10 ENCOUNTER — Ambulatory Visit
Admission: RE | Admit: 2012-06-10 | Discharge: 2012-06-10 | Disposition: A | Discharge status: Home / Self Care | Payer: Medicare Other | Source: Ambulatory Visit | Attending: Gastroenterology | Admitting: Gastroenterology

## 2012-06-10 DIAGNOSIS — R109 Unspecified abdominal pain: Secondary | ICD-10-CM

## 2012-06-10 MED ORDER — IOHEXOL 300 MG/ML  SOLN
100.0000 mL | Freq: Once | INTRAMUSCULAR | Status: AC | PRN
  Administered 2012-06-10: 100 mL via INTRAVENOUS

## 2012-07-06 ENCOUNTER — Other Ambulatory Visit (HOSPITAL_COMMUNITY): Payer: Self-pay | Admitting: Urology

## 2012-07-06 DIAGNOSIS — N289 Disorder of kidney and ureter, unspecified: Secondary | ICD-10-CM

## 2012-07-14 ENCOUNTER — Ambulatory Visit (HOSPITAL_COMMUNITY)
Admission: RE | Admit: 2012-07-14 | Discharge: 2012-07-14 | Disposition: A | Discharge status: Home / Self Care | Payer: Medicare Other | Source: Ambulatory Visit | Attending: Urology | Admitting: Urology

## 2012-07-14 DIAGNOSIS — D3500 Benign neoplasm of unspecified adrenal gland: Secondary | ICD-10-CM | POA: Insufficient documentation

## 2012-07-14 DIAGNOSIS — N281 Cyst of kidney, acquired: Secondary | ICD-10-CM | POA: Insufficient documentation

## 2012-07-14 DIAGNOSIS — N289 Disorder of kidney and ureter, unspecified: Secondary | ICD-10-CM

## 2012-07-14 MED ORDER — GADOBENATE DIMEGLUMINE 529 MG/ML IV SOLN
15.0000 mL | Freq: Once | INTRAVENOUS | Status: AC | PRN
  Administered 2012-07-14: 14 mL via INTRAVENOUS

## 2012-07-15 ENCOUNTER — Ambulatory Visit: Payer: Medicare Other

## 2012-08-16 ENCOUNTER — Ambulatory Visit
Admission: RE | Admit: 2012-08-16 | Discharge: 2012-08-16 | Disposition: A | Discharge status: Home / Self Care | Payer: Medicare Other | Source: Ambulatory Visit

## 2012-08-16 DIAGNOSIS — Z1231 Encounter for screening mammogram for malignant neoplasm of breast: Secondary | ICD-10-CM

## 2012-10-06 ENCOUNTER — Other Ambulatory Visit: Payer: Self-pay | Admitting: Internal Medicine

## 2012-10-06 DIAGNOSIS — E210 Primary hyperparathyroidism: Secondary | ICD-10-CM

## 2012-10-12 ENCOUNTER — Encounter (HOSPITAL_COMMUNITY)
Admission: RE | Admit: 2012-10-12 | Discharge: 2012-10-12 | Disposition: A | Discharge status: Home / Self Care | Payer: Medicare Other | Source: Ambulatory Visit | Attending: Internal Medicine | Admitting: Internal Medicine

## 2012-10-12 DIAGNOSIS — E210 Primary hyperparathyroidism: Secondary | ICD-10-CM | POA: Insufficient documentation

## 2012-10-12 MED ORDER — TECHNETIUM TC 99M SESTAMIBI GENERIC - CARDIOLITE
25.6000 | Freq: Once | INTRAVENOUS | Status: AC | PRN
  Administered 2012-10-12: 26 via INTRAVENOUS

## 2012-10-29 ENCOUNTER — Encounter (INDEPENDENT_AMBULATORY_CARE_PROVIDER_SITE_OTHER): Payer: Self-pay | Admitting: Surgery

## 2012-10-29 ENCOUNTER — Ambulatory Visit (INDEPENDENT_AMBULATORY_CARE_PROVIDER_SITE_OTHER): Payer: Medicare Other | Admitting: Surgery

## 2012-10-29 VITALS — BP 164/84 | HR 78 | Temp 98.6°F | Resp 18 | Ht 64.0 in | Wt 153.0 lb

## 2012-10-29 DIAGNOSIS — E210 Primary hyperparathyroidism: Secondary | ICD-10-CM | POA: Insufficient documentation

## 2012-10-29 NOTE — Progress Notes (Signed)
General Surgery [facility]  Chief Complaint  Patient presents with  . New Evaluation    primary hyperparathyroidism - referral from Dr. Debara Pickett    HISTORY: The patient is a 72 year old female referred by her endocrinologist for management of primary hyperparathyroidism. Patient has apparently had borderline elevated serum calcium levels for a number of years. She notes chronic fatigue, hypertension which has sometimes been difficult to control, large joint discomfort, and a recent diagnosis of osteopenia by bone density scanning. She denies nephrolithiasis. Evaluation shows serum calcium levels of 10.5 and 10.6. Intact PTH level is 35. Nuclear medicine parathyroid scan was performed on 10/12/2012. This localizes a parathyroid adenoma to the right inferior position.  Patient has no prior history of endocrine disease. She has had no prior head or neck surgery. There is no family history of endocrine neoplasm. Patient did undergo workup for benign adrenal adenoma. Testing for pheochromocytoma was reportedly negative.  Past Medical History  Diagnosis Date  . Obesity   . Hypertension   . Diabetes mellitus   . Hyperlipidemia   . Reflux esophagitis   . Herpes zoster   . Shingles     pt states she didn't have this on 10/28/11    Current Outpatient Prescriptions  Medication Sig Dispense Refill  . aspirin 325 MG tablet Take 325 mg by mouth daily.      Marland Kitchen atorvastatin (LIPITOR) 10 MG tablet Take 10 mg by mouth daily.      . carvedilol (COREG) 12.5 MG tablet Take 12.5 mg by mouth 2 (two) times daily with a meal.      . cloNIDine (CATAPRES) 0.1 MG tablet Take 1 tablet (0.1 mg total) by mouth at bedtime.  60 tablet  0  . furosemide (LASIX) 20 MG tablet Take 20 mg by mouth 2 (two) times daily.      . hydrALAZINE (APRESOLINE) 50 MG tablet Take 50 mg by mouth 2 (two) times daily.      . metFORMIN (GLUCOPHAGE) 500 MG tablet Take 500 mg by mouth daily.      . Multiple Vitamin  (MULTIVITAMIN WITH MINERALS) TABS Take 1 tablet by mouth daily.      . hydrochlorothiazide (MICROZIDE) 12.5 MG capsule Take 1 capsule (12.5 mg total) by mouth daily.  30 capsule  0   No current facility-administered medications for this visit.    Allergies  Allergen Reactions  . Crestor [Rosuvastatin]     Feels "achey".  . Norvasc [Amlodipine Besylate] Swelling  . Simvastatin   . Zetia [Ezetimibe]   . Sulfa Antibiotics Rash    Family History  Problem Relation Age of Onset  . Heart failure Mother   . Cancer Mother   . Cancer Father     History   Social History  . Marital Status: Widowed    Spouse Name: N/A    Number of Children: N/A  . Years of Education: N/A   Social History Main Topics  . Smoking status: Former Smoker -- 20 years    Quit date: 10/28/1987  . Smokeless tobacco: Never Used  . Alcohol Use: No  . Drug Use: No  . Sexual Activity: None   Other Topics Concern  . None   Social History Narrative  . None    REVIEW OF SYSTEMS - PERTINENT POSITIVES ONLY: Denies tremor. Denies palpitations. Denies compressive symptoms. Denies nephrolithiasis. Positive for chronic fatigue, osteopenia, bone and joint discomfort, and fluctuating hypertension.  EXAMCeasar Mons Vitals:   10/29/12 1330  BP: 164/84  Pulse: 78  Temp: 98.6 F (37 C)  Resp: 18    HEENT: normocephalic; pupils equal and reactive; sclerae clear; dentition good; mucous membranes moist NECK:  No palpable nodules in the thyroid bed; symmetric on extension; no palpable anterior or posterior cervical lymphadenopathy; no supraclavicular masses; no tenderness CHEST: clear to auscultation bilaterally without rales, rhonchi, or wheezes CARDIAC: regular rate and rhythm without significant murmur; peripheral pulses are full EXT:  non-tender without edema; no deformity NEURO: no gross focal deficits; no sign of tremor   LABORATORY RESULTS: See [facility] ([facility]L-Epic) for most recent results  RADIOLOGY  RESULTS: See [facility] (CHL-Epic) for most recent results  IMPRESSION: Primary hyperparathyroidism, likely right inferior parathyroid adenoma  PLAN: I discussed the above findings at length with the patient and her son. While her intact PTH level is not elevated, it is inappropriately normal given the fact that her calcium levels are mildly elevated. The nuclear medicine scan is positive for right inferior adenoma. I believe she is a good candidate for minimally invasive parathyroidectomy.  We discussed the procedure. We discussed the hospital stay. We discussed her postoperative recovery and returned activity. We discussed the possibility of a second gland adenoma and potential for second surgery. They understand and wish to proceed. We will make arrangements for surgery in the near future at a time convenient for the patient and her family.  The risks and benefits of the procedure have been discussed at length with the patient.  The patient understands the proposed procedure, potential alternative treatments, and the course of recovery to be expected.  All of the patient's questions have been answered at this time.  The patient wishes to proceed with surgery.  Velora Heckler, MD, FACS General & Endocrine Surgery [facility]  Primary Care Physician: Neldon Labella, MD

## 2012-10-29 NOTE — Patient Instructions (Signed)

## 2012-11-15 ENCOUNTER — Encounter (INDEPENDENT_AMBULATORY_CARE_PROVIDER_SITE_OTHER): Payer: Self-pay

## 2012-11-22 ENCOUNTER — Encounter (HOSPITAL_COMMUNITY): Payer: Self-pay | Admitting: Pharmacy Technician

## 2012-11-25 ENCOUNTER — Encounter (HOSPITAL_COMMUNITY): Payer: Self-pay

## 2012-11-25 ENCOUNTER — Encounter (HOSPITAL_COMMUNITY)
Admission: RE | Admit: 2012-11-25 | Discharge: 2012-11-25 | Disposition: A | Discharge status: Home / Self Care | Payer: Medicare Other | Source: Ambulatory Visit | Attending: Surgery | Admitting: Surgery

## 2012-11-25 DIAGNOSIS — Z01818 Encounter for other preprocedural examination: Secondary | ICD-10-CM | POA: Insufficient documentation

## 2012-11-25 DIAGNOSIS — Z01812 Encounter for preprocedural laboratory examination: Secondary | ICD-10-CM | POA: Insufficient documentation

## 2012-11-25 LAB — BASIC METABOLIC PANEL
BUN: 12 mg/dL (ref 6–23)
Calcium: 10.7 mg/dL — ABNORMAL HIGH (ref 8.4–10.5)
GFR calc Af Amer: >90 mL/min (ref >90)
GFR calc non Af Amer: 84 mL/min — ABNORMAL LOW (ref >90)
Glucose, Bld: 120 mg/dL — ABNORMAL HIGH (ref 70–99)
Sodium: 138 mEq/L (ref 135–145)

## 2012-11-25 LAB — CBC
MCH: 28.5 pg (ref 26.0–34.0)
MCHC: 33.8 g/dL (ref 30.0–36.0)
Platelets: 223 10*3/uL (ref 150–400)
RBC: 5.05 MIL/uL (ref 3.87–5.11)

## 2012-11-25 NOTE — Patient Instructions (Addendum)
20 Tina Frank  11/25/2012   Your procedure is scheduled on: 9-25   -2014  Report to [facility] at    0530    AM   Call this number if you have problems the morning of surgery: [phone]  Or Presurgical Testing [phone](Mackinze Criado)      Do not eat food:After Midnight.    Take these medicines the morning of surgery with A SIP OF WATER: Tylenol. Carvedilol.   Do not wear jewelry, make-up or nail polish.  Do not wear lotions, powders, or perfumes. You may wear deodorant.  Do not shave 12 hours prior to first CHG shower(legs and under arms).(face and neck okay.)  Do not bring valuables to the hospital.  Contacts, dentures or bridgework,body piercing,  may not be worn into surgery.  Leave suitcase in the car. After surgery it may be brought to your room.  For patients admitted to the hospital, checkout time is 11:00 AM the day of discharge.   Patients discharged the day of surgery will not be allowed to drive home. Must have responsible person with you x 24 hours once discharged.  Name and phone number of your driver:Stephen Ikeda-son [phone] cell   Special Instructions: CHG(Chlorhedine 4%-"Hibiclens","Betasept","Aplicare") Shower Use Special Wash: see special instructions.(avoid face and genitals)       Failure to follow these instructions may result in Cancellation of your surgery.   Patient signature_______________________________________________________

## 2012-11-25 NOTE — Pre-Procedure Instructions (Signed)
11-25-12 EKG/ CXR 2'14 -Epic

## 2012-11-30 NOTE — Progress Notes (Signed)
Quick Note:  These results are acceptable for scheduled surgery.  Lavera Vandermeer M. Briea Mcenery, MD, FACS [facility] Office: [phone]   ______ 

## 2012-12-01 NOTE — Anesthesia Preprocedure Evaluation (Addendum)
Anesthesia Evaluation  Patient identified by MRN, date of birth, ID band Patient awake    Reviewed: Allergy & Precautions, H&P , NPO status , Patient's Chart, lab work & pertinent test results, reviewed documented beta blocker date and time   Airway Mallampati: II TM Distance: >3 FB Neck ROM: full    Dental  (+) Caps and Dental Advisory Given All upper front are capped (bridge):   Pulmonary neg pulmonary ROS,  breath sounds clear to auscultation  Pulmonary exam normal       Cardiovascular Exercise Tolerance: Good hypertension, Pt. on medications and Pt. on home beta blockers Rhythm:regular Rate:Normal     Neuro/Psych negative neurological ROS  negative psych ROS   GI/Hepatic negative GI ROS, Neg liver ROS,   Endo/Other  diabetes, Well Controlled, Type 2, Oral Hypoglycemic AgentsHyperparathyroidism (primary)  Renal/GU negative Renal ROS  negative genitourinary   Musculoskeletal   Abdominal   Peds  Hematology negative hematology ROS (+)   Anesthesia Other Findings   Reproductive/Obstetrics negative OB ROS                          Anesthesia Physical Anesthesia Plan  ASA: III  Anesthesia Plan: General   Post-op Pain Management:    Induction: Intravenous  Airway Management Planned: Oral ETT  Additional Equipment:   Intra-op Plan:   Post-operative Plan: Extubation in OR  Informed Consent: I have reviewed the patients History and Physical, chart, labs and discussed the procedure including the risks, benefits and alternatives for the proposed anesthesia with the patient or authorized representative who has indicated his/her understanding and acceptance.   Dental Advisory Given  Plan Discussed with: CRNA and Surgeon  Anesthesia Plan Comments:         Anesthesia Quick Evaluation

## 2012-12-02 ENCOUNTER — Encounter (HOSPITAL_COMMUNITY)
Admission: RE | Disposition: A | Discharge status: Home / Self Care | Payer: Self-pay | Source: Ambulatory Visit | Attending: Surgery

## 2012-12-02 ENCOUNTER — Encounter (HOSPITAL_COMMUNITY): Payer: Self-pay | Admitting: Anesthesiology

## 2012-12-02 ENCOUNTER — Ambulatory Visit (HOSPITAL_COMMUNITY)
Admission: RE | Admit: 2012-12-02 | Discharge: 2012-12-02 | Disposition: A | Discharge status: Home / Self Care | Payer: Medicare Other | Source: Ambulatory Visit | Attending: Surgery | Admitting: Surgery

## 2012-12-02 ENCOUNTER — Encounter (HOSPITAL_COMMUNITY): Payer: Self-pay | Admitting: *Deleted

## 2012-12-02 ENCOUNTER — Other Ambulatory Visit (INDEPENDENT_AMBULATORY_CARE_PROVIDER_SITE_OTHER): Payer: Self-pay

## 2012-12-02 ENCOUNTER — Ambulatory Visit (HOSPITAL_COMMUNITY): Payer: Medicare Other | Admitting: Anesthesiology

## 2012-12-02 DIAGNOSIS — E213 Hyperparathyroidism, unspecified: Secondary | ICD-10-CM

## 2012-12-02 DIAGNOSIS — D351 Benign neoplasm of parathyroid gland: Secondary | ICD-10-CM | POA: Insufficient documentation

## 2012-12-02 DIAGNOSIS — E210 Primary hyperparathyroidism: Secondary | ICD-10-CM | POA: Insufficient documentation

## 2012-12-02 DIAGNOSIS — E119 Type 2 diabetes mellitus without complications: Secondary | ICD-10-CM | POA: Insufficient documentation

## 2012-12-02 DIAGNOSIS — I10 Essential (primary) hypertension: Secondary | ICD-10-CM | POA: Insufficient documentation

## 2012-12-02 DIAGNOSIS — Z79899 Other long term (current) drug therapy: Secondary | ICD-10-CM | POA: Insufficient documentation

## 2012-12-02 DIAGNOSIS — E785 Hyperlipidemia, unspecified: Secondary | ICD-10-CM | POA: Insufficient documentation

## 2012-12-02 HISTORY — PX: PARATHYROIDECTOMY: SHX19

## 2012-12-02 LAB — GLUCOSE, CAPILLARY: Glucose-Capillary: 115 mg/dL — ABNORMAL HIGH (ref 70–99)

## 2012-12-02 SURGERY — PARATHYROIDECTOMY
Anesthesia: General | Site: Neck | Wound class: Clean

## 2012-12-02 MED ORDER — CEFAZOLIN SODIUM-DEXTROSE 2-3 GM-% IV SOLR
INTRAVENOUS | Status: AC
  Filled 2012-12-02: qty 50

## 2012-12-02 MED ORDER — LACTATED RINGERS IV SOLN
INTRAVENOUS | Status: DC | PRN
  Administered 2012-12-02: 07:00:00 via INTRAVENOUS

## 2012-12-02 MED ORDER — ROCURONIUM BROMIDE 100 MG/10ML IV SOLN
INTRAVENOUS | Status: DC | PRN
  Administered 2012-12-02: 30 mg via INTRAVENOUS

## 2012-12-02 MED ORDER — GLYCOPYRROLATE 0.2 MG/ML IJ SOLN
INTRAMUSCULAR | Status: DC | PRN
  Administered 2012-12-02: 0.6 mg via INTRAVENOUS

## 2012-12-02 MED ORDER — NEOSTIGMINE METHYLSULFATE 1 MG/ML IJ SOLN
INTRAMUSCULAR | Status: DC | PRN
  Administered 2012-12-02: 4 mg via INTRAVENOUS

## 2012-12-02 MED ORDER — HYDROMORPHONE HCL PF 1 MG/ML IJ SOLN
0.2500 mg | INTRAMUSCULAR | Status: DC | PRN
  Administered 2012-12-02: 0.25 mg via INTRAVENOUS

## 2012-12-02 MED ORDER — FENTANYL CITRATE 0.05 MG/ML IJ SOLN
INTRAMUSCULAR | Status: DC | PRN
  Administered 2012-12-02: 50 ug via INTRAVENOUS

## 2012-12-02 MED ORDER — BUPIVACAINE HCL (PF) 0.25 % IJ SOLN
INTRAMUSCULAR | Status: AC
  Filled 2012-12-02: qty 30

## 2012-12-02 MED ORDER — HYDROMORPHONE HCL PF 1 MG/ML IJ SOLN
INTRAMUSCULAR | Status: AC
  Filled 2012-12-02: qty 1

## 2012-12-02 MED ORDER — SUCCINYLCHOLINE CHLORIDE 20 MG/ML IJ SOLN
INTRAMUSCULAR | Status: DC | PRN
  Administered 2012-12-02: 100 mg via INTRAVENOUS

## 2012-12-02 MED ORDER — CEFAZOLIN SODIUM-DEXTROSE 2-3 GM-% IV SOLR
2.0000 g | INTRAVENOUS | Status: AC
  Administered 2012-12-02: 2 g via INTRAVENOUS

## 2012-12-02 MED ORDER — MIDAZOLAM HCL 5 MG/5ML IJ SOLN
INTRAMUSCULAR | Status: DC | PRN
  Administered 2012-12-02: 2 mg via INTRAVENOUS

## 2012-12-02 MED ORDER — EPHEDRINE SULFATE 50 MG/ML IJ SOLN
INTRAMUSCULAR | Status: DC | PRN
  Administered 2012-12-02: 5 mg via INTRAVENOUS
  Administered 2012-12-02: 10 mg via INTRAVENOUS

## 2012-12-02 MED ORDER — ONDANSETRON HCL 4 MG/2ML IJ SOLN
INTRAMUSCULAR | Status: DC | PRN
  Administered 2012-12-02: 4 mg via INTRAVENOUS

## 2012-12-02 MED ORDER — BUPIVACAINE HCL 0.25 % IJ SOLN
INTRAMUSCULAR | Status: DC | PRN
  Administered 2012-12-02: 10 mL

## 2012-12-02 MED ORDER — LACTATED RINGERS IV SOLN: INTRAVENOUS | Status: DC

## 2012-12-02 MED ORDER — DEXAMETHASONE SODIUM PHOSPHATE 10 MG/ML IJ SOLN
INTRAMUSCULAR | Status: DC | PRN
  Administered 2012-12-02: 10 mg via INTRAVENOUS

## 2012-12-02 MED ORDER — 0.9 % SODIUM CHLORIDE (POUR BTL) OPTIME
TOPICAL | Status: DC | PRN
  Administered 2012-12-02: 1000 mL

## 2012-12-02 MED ORDER — LIDOCAINE HCL (CARDIAC) 20 MG/ML IV SOLN
INTRAVENOUS | Status: DC | PRN
  Administered 2012-12-02: 100 mg via INTRAVENOUS

## 2012-12-02 MED ORDER — PROPOFOL 10 MG/ML IV BOLUS
INTRAVENOUS | Status: DC | PRN
  Administered 2012-12-02: 160 mg via INTRAVENOUS

## 2012-12-02 MED ORDER — HYDROCODONE-ACETAMINOPHEN 10-325 MG PO TABS: 1.0000 | ORAL_TABLET | ORAL | Status: DC | PRN

## 2012-12-02 SURGICAL SUPPLY — 41 items
APL SKNCLS STERI-STRIP NONHPOA (GAUZE/BANDAGES/DRESSINGS) ×1
ATTRACTOMAT 16X20 MAGNETIC DRP (DRAPES) ×2 IMPLANT
BENZOIN TINCTURE PRP APPL 2/3 (GAUZE/BANDAGES/DRESSINGS) ×2 IMPLANT
BLADE HEX COATED 2.75 (ELECTRODE) ×2 IMPLANT
BLADE SURG 15 STRL LF DISP TIS (BLADE) ×1 IMPLANT
BLADE SURG 15 STRL SS (BLADE) ×2
CANISTER SUCTION 2500CC (MISCELLANEOUS) ×2 IMPLANT
CHLORAPREP W/TINT 10.5 ML (MISCELLANEOUS) ×2 IMPLANT
CLIP TI MEDIUM 6 (CLIP) ×4 IMPLANT
CLIP TI WIDE RED SMALL 6 (CLIP) ×4 IMPLANT
CLOTH BEACON ORANGE TIMEOUT ST (SAFETY) ×2 IMPLANT
DISSECTOR ROUND CHERRY 3/8 STR (MISCELLANEOUS) ×1 IMPLANT
DRAPE PED LAPAROTOMY (DRAPES) ×2 IMPLANT
DRESSING SURGICEL FIBRLLR 1X2 (HEMOSTASIS) ×1 IMPLANT
DRSG SURGICEL FIBRILLAR 1X2 (HEMOSTASIS) ×2
ELECT REM PT RETURN 9FT ADLT (ELECTROSURGICAL) ×2
ELECTRODE REM PT RTRN 9FT ADLT (ELECTROSURGICAL) ×1 IMPLANT
GAUZE SPONGE 4X4 16PLY XRAY LF (GAUZE/BANDAGES/DRESSINGS) ×2 IMPLANT
GLOVE SURG ORTHO 8.0 STRL STRW (GLOVE) ×2 IMPLANT
GOWN STRL REIN XL XLG (GOWN DISPOSABLE) ×6 IMPLANT
KIT BASIN OR (CUSTOM PROCEDURE TRAY) ×2 IMPLANT
NDL HYPO 25X1 1.5 SAFETY (NEEDLE) ×1 IMPLANT
NEEDLE HYPO 25X1 1.5 SAFETY (NEEDLE) ×2 IMPLANT
NS IRRIG 1000ML POUR BTL (IV SOLUTION) ×2 IMPLANT
PACK BASIC VI WITH GOWN DISP (CUSTOM PROCEDURE TRAY) ×2 IMPLANT
PENCIL BUTTON HOLSTER BLD 10FT (ELECTRODE) ×2 IMPLANT
SPONGE GAUZE 4X4 12PLY (GAUZE/BANDAGES/DRESSINGS) ×1 IMPLANT
STAPLER VISISTAT 35W (STAPLE) ×2 IMPLANT
STRIP CLOSURE SKIN 1/2X4 (GAUZE/BANDAGES/DRESSINGS) ×2 IMPLANT
SUT MNCRL AB 4-0 PS2 18 (SUTURE) ×2 IMPLANT
SUT SILK 2 0 (SUTURE)
SUT SILK 2-0 18XBRD TIE 12 (SUTURE) IMPLANT
SUT SILK 3 0 (SUTURE)
SUT SILK 3-0 18XBRD TIE 12 (SUTURE) IMPLANT
SUT VIC AB 3-0 SH 18 (SUTURE) ×2 IMPLANT
SYR BULB IRRIGATION 50ML (SYRINGE) ×2 IMPLANT
SYR CONTROL 10ML LL (SYRINGE) ×2 IMPLANT
TAPE CLOTH SURG 4X10 WHT LF (GAUZE/BANDAGES/DRESSINGS) ×1 IMPLANT
TOWEL OR 17X26 10 PK STRL BLUE (TOWEL DISPOSABLE) ×2 IMPLANT
TOWEL OR NON WOVEN STRL DISP B (DISPOSABLE) ×2 IMPLANT
YANKAUER SUCT BULB TIP 10FT TU (MISCELLANEOUS) ×2 IMPLANT

## 2012-12-02 NOTE — Anesthesia Postprocedure Evaluation (Signed)
  Anesthesia Post-op Note  Patient: Tina Frank  Procedure(s) Performed: Procedure(s) (LRB): PARATHYROIDECTOMY with frozen section  (N/A)  Patient Location: PACU  Anesthesia Type: General  Level of Consciousness: awake and alert   Airway and Oxygen Therapy: Patient Spontanous Breathing  Post-op Pain: mild  Post-op Assessment: Post-op Vital signs reviewed, Patient's Cardiovascular Status Stable, Respiratory Function Stable, Patent Airway and No signs of Nausea or vomiting  Last Vitals:  Filed Vitals:   12/02/12 0900  BP: 118/50  Pulse: 53  Temp:   Resp: 15    Post-op Vital Signs: stable   Complications: No apparent anesthesia complications

## 2012-12-02 NOTE — Transfer of Care (Signed)
Immediate Anesthesia Transfer of Care Note  Patient: Tina Frank  Procedure(s) Performed: Procedure(s): PARATHYROIDECTOMY with frozen section  (N/A)  Patient Location: PACU  Anesthesia Type:General  Level of Consciousness: sedated  Airway & Oxygen Therapy: Patient Spontanous Breathing and Patient connected to face mask oxygen  Post-op Assessment: Report given to PACU RN and Post -op Vital signs reviewed and stable  Post vital signs: Reviewed and stable  Complications: No apparent anesthesia complications

## 2012-12-02 NOTE — H&P (Signed)
Tina Frank is an 72 y.o. female.    Chief Complaint: primary hyperparathyroidism, hypercalcemia  HPI: The patient is a 72 year old female referred by her endocrinologist for management of primary hyperparathyroidism. Patient has apparently had borderline elevated serum calcium levels for a number of years. She notes chronic fatigue, hypertension which has sometimes been difficult to control, large joint discomfort, and a recent diagnosis of osteopenia by bone density scanning. She denies nephrolithiasis. Evaluation shows serum calcium levels of 10.5 and 10.6. Intact PTH level is 35. Nuclear medicine parathyroid scan was performed on 10/12/2012. This localizes a parathyroid adenoma to the right inferior position.   Patient has no prior history of endocrine disease. She has had no prior head or neck surgery. There is no family history of endocrine neoplasm. Patient did undergo workup for benign adrenal adenoma. Testing for pheochromocytoma was reportedly negative.  Past Medical History  Diagnosis Date  . Obesity     is resolve- pt. highest weight was 180 10 yrs ago.  Marland Kitchen Hypertension   . Hyperlipidemia   . Reflux esophagitis   . Herpes zoster   . Shingles     pt states she didn't have this on 10/28/11, given"shingle vaccine" only  . Diabetes mellitus      5 yrs or more-oral meds control    Past Surgical History  Procedure Laterality Date  . No past surgeries    . Colonoscopy    . Childbirth      x2  . Eye surgery      laser surgery for retinal tear.    Family History  Problem Relation Age of Onset  . Heart failure Mother   . Cancer Mother   . Cancer Father    Social History:  reports that she quit smoking about 25 years ago. She has never used smokeless tobacco. She reports that she does not drink alcohol or use illicit drugs.  Allergies:  Allergies  Allergen Reactions  . Crestor [Rosuvastatin]     Feels "achey".  . Norvasc [Amlodipine Besylate] Swelling  . Simvastatin     Feels achey  . Zetia [Ezetimibe]     Feels achey  . Sulfa Antibiotics Rash    Medications Prior to Admission  Medication Sig Dispense Refill  . acetaminophen (TYLENOL) 500 MG tablet Take 500 mg by mouth every 6 (six) hours as needed for pain.      Marland Kitchen aspirin 325 MG tablet Take 325 mg by mouth daily.      Marland Kitchen atorvastatin (LIPITOR) 10 MG tablet Take 10 mg by mouth at bedtime.       . carvedilol (COREG) 12.5 MG tablet Take 12.5 mg by mouth 2 (two) times daily with a meal.      . cloNIDine (CATAPRES) 0.1 MG tablet Take 0.1 mg by mouth daily as needed (for blood over 160).      . furosemide (LASIX) 20 MG tablet Take 20 mg by mouth 2 (two) times daily.      . hydrALAZINE (APRESOLINE) 50 MG tablet Take 50 mg by mouth 2 (two) times daily.      . metFORMIN (GLUCOPHAGE-XR) 500 MG 24 hr tablet Take 500 mg by mouth daily with breakfast.      . Multiple Vitamin (MULTIVITAMIN WITH MINERALS) TABS Take 1 tablet by mouth daily.      Marland Kitchen telmisartan (MICARDIS) 40 MG tablet Take 40 mg by mouth 2 (two) times daily.        Results for orders placed during the hospital  encounter of 12/02/12 (from the past 48 hour(s))  GLUCOSE, CAPILLARY     Status: Abnormal   Collection Time    12/02/12  5:56 AM      Result Value Range   Glucose-Capillary 115 (*) 70 - 99 mg/dL   Comment 1 Documented in Chart     No results found.  Review of Systems  Constitutional: Positive for malaise/fatigue.  HENT: Negative.   Eyes: Negative.   Respiratory: Negative.   Cardiovascular: Negative.   Gastrointestinal: Negative.   Genitourinary: Negative.   Musculoskeletal: Positive for joint pain.  Skin: Negative.   Neurological: Negative.   Endo/Heme/Allergies: Negative.   Psychiatric/Behavioral: Negative.     Blood pressure 158/73, pulse 65, temperature 98 F (36.7 C), temperature source Oral, resp. rate 18, SpO2 97.00%. Physical Exam  Constitutional: She is oriented to person, place, and time. She appears well-developed and  well-nourished. No distress.  HENT:  Head: Normocephalic and atraumatic.  Right Ear: External ear normal.  Left Ear: External ear normal.  Mouth/Throat: Oropharynx is clear and moist.  Eyes: Conjunctivae are normal. Pupils are equal, round, and reactive to light. No scleral icterus.  Neck: Normal range of motion. Neck supple. No tracheal deviation present. No thyromegaly present.  Cardiovascular: Normal rate, regular rhythm and normal heart sounds.   No murmur heard. Respiratory: Effort normal and breath sounds normal. She exhibits no tenderness.  GI: Soft. Bowel sounds are normal. She exhibits no distension.  Musculoskeletal: Normal range of motion. She exhibits no edema.  Neurological: She is alert and oriented to person, place, and time.  Skin: Skin is warm and dry.  Psychiatric: She has a normal mood and affect. Her behavior is normal.     Assessment/Plan Primary hyperparathyroidism  Plan right inferior minimally invasive parathyroidectomy  The risks and benefits of the procedure have been discussed at length with the patient.  The patient understands the proposed procedure, potential alternative treatments, and the course of recovery to be expected.  All of the patient's questions have been answered at this time.  The patient wishes to proceed with surgery.  Velora Heckler, MD, FACS General & Endocrine Surgery [facility] Office: [phone]   Johnnye Sandford Judie Petit 12/02/2012, 7:12 AM

## 2012-12-02 NOTE — Op Note (Signed)
OPERATIVE REPORT - PARATHYROIDECTOMY  Preoperative diagnosis: Primary hyperparathyroidism  Postop diagnosis: Same  Procedure: Right inferior minimally invasive parathyroidectomy  Surgeon:  Velora Heckler, MD, FACS  Anesthesia: Gen. endotracheal  Estimated blood loss: Minimal  Preparation: ChloraPrep  Indications: The patient is a 72 year old female referred by her endocrinologist for management of primary hyperparathyroidism. Patient has apparently had borderline elevated serum calcium levels for a number of years. She notes chronic fatigue, hypertension which has sometimes been difficult to control, large joint discomfort, and a recent diagnosis of osteopenia by bone density scanning. She denies nephrolithiasis. Evaluation shows serum calcium levels of 10.5 and 10.6. Intact PTH level is 35. Nuclear medicine parathyroid scan was performed on 10/12/2012. This localizes a parathyroid adenoma to the right inferior position.  Procedure: Patient was prepared in the holding area. He was brought to operating room and placed in a supine position on the operating room table. Following administration of general anesthesia, the patient was positioned and then prepped and draped in the usual strict aseptic fashion. After ascertaining that an adequate level of anesthesia been achieved, a neck incision was made with a #15 blade. Dissection was carried through subcutaneous tissues and platysma. Hemostasis was obtained with the electrocautery. Skin flaps were developed circumferentially and a Weitlander retractor was placed for exposure.  Strap muscles were incised in the midline. Strap muscles were reflected exposing the thyroid lobe. With gentle blunt dissection the thyroid lobe was mobilized.  Dissection was carried through adipose tissue and an enlarged parathyroid gland was identified. It was gently mobilized. Vascular structures were divided between small and medium ligaclips. Care was taken to avoid the  recurrent laryngeal nerve and the esophagus. The parathyroid gland was completely excised. It was submitted to pathology where frozen section confirmed parathyroid tissue consistent with adenoma.  Neck was irrigated with warm saline and good hemostasis was noted. Fibrillar was placed in the operative field. Strap muscles were reapproximated in the midline with interrupted 3-0 Vicryl sutures. Platysma was closed with interrupted 3-0 Vicryl sutures. Skin was closed with a running 4-0 Monocryl subcuticular suture. Marcaine was infiltrated circumferentially. Wound was washed and dried and benzoin and Steri-Strips were applied. Sterile gauze dressings were applied. Patient was awakened from anesthesia and brought to the recovery room. The patient tolerated the procedure well.   Velora Heckler, MD, FACS General & Endocrine Surgery [facility]

## 2012-12-03 ENCOUNTER — Encounter (HOSPITAL_COMMUNITY): Payer: Self-pay | Admitting: Surgery

## 2012-12-17 ENCOUNTER — Telehealth (INDEPENDENT_AMBULATORY_CARE_PROVIDER_SITE_OTHER): Payer: Self-pay | Admitting: *Deleted

## 2012-12-17 NOTE — Telephone Encounter (Signed)
Patient called this morning to report she is having a sore throat and her voice is becoming more hoarse.  Patient also reports that she has been trying to get out and walk some everyday since surgery however yesterday she became "winded" after walking.  Patient had a parathyroidectomy on 12/02/12.  Explained that I would send a message to Dr. Gerrit Friends to update him on these symptoms and as soon as we have a plan I will give her a call back.  Patient states understanding at this time.

## 2012-12-17 NOTE — Telephone Encounter (Signed)
Reviewed below:  Consuelo Pandy, RN at 12/17/2012 8:41 AM   Status: Signed            Patient called this morning to report she is having a sore throat and her voice is becoming more hoarse. Patient also reports that she has been trying to get out and walk some everyday since surgery however yesterday she became "winded" after walking. Patient had a parathyroidectomy on 12/02/12. Explained that I would send a message to Dr. Gerrit Friends to update him on these symptoms and as soon as we have a plan I will give her a call back. Patient states understanding at this time.    Symptoms noted.  Will see patient in follow up at [facility] office on 10/15.  Velora Heckler, MD, [facility] Office: [phone]

## 2012-12-17 NOTE — Telephone Encounter (Signed)
Called patient to update her on Dr. Ardine Eng response.  Patient states understanding and agreeable at this time.

## 2012-12-22 ENCOUNTER — Ambulatory Visit (INDEPENDENT_AMBULATORY_CARE_PROVIDER_SITE_OTHER): Payer: Medicare Other | Admitting: Surgery

## 2012-12-22 ENCOUNTER — Encounter (INDEPENDENT_AMBULATORY_CARE_PROVIDER_SITE_OTHER): Payer: Self-pay | Admitting: Surgery

## 2012-12-22 VITALS — BP 138/68 | HR 76 | Temp 96.7°F | Resp 18

## 2012-12-22 DIAGNOSIS — E210 Primary hyperparathyroidism: Secondary | ICD-10-CM

## 2012-12-22 NOTE — Progress Notes (Signed)
General Surgery [facility]  Chief Complaint  Patient presents with  . Routine Post Op    parathyroidectomy 12/02/2012    HISTORY: Patient is a 72 year old female who underwent minimally invasive parathyroidectomy on 12/02/2012. Final pathology shows a parathyroid adenoma weighing 500 mg. Postop serum calcium level is normal at 8.9. Patient has experienced some mild hoarseness since surgery. This seems to be improving.  EXAM: Surgical incision is well-healed. Minimal soft tissue swelling. No sign of seroma. No sign of infection. Voice is slightly hoarse at conversational level.  IMPRESSION: Status post minimally invasive parathyroidectomy  PLAN: Patient will begin applying topical creams to her incision. She will return in 6 weeks for wound check and for voice quality assessment. We will repeat a calcium level and obtained an intact PTH level prior to that office visit.  Velora Heckler, MD, FACS General & Endocrine Surgery [facility]   Visit Diagnoses: 1. Hyperparathyroidism, primary

## 2012-12-22 NOTE — Patient Instructions (Signed)
  COCOA BUTTER & VITAMIN E CREAM  ([employer] or other brand)  Apply cocoa butter/vitamin E cream to your incision 2 - 3 times daily.  Massage cream into incision for one minute with each application.  Use sunscreen (50 SPF or higher) for first 6 months after surgery if area is exposed to sun.  You may substitute Mederma or other scar reducing creams as desired.   

## 2013-01-20 ENCOUNTER — Telehealth (INDEPENDENT_AMBULATORY_CARE_PROVIDER_SITE_OTHER): Payer: Self-pay | Admitting: General Surgery

## 2013-01-20 NOTE — Telephone Encounter (Signed)
Patient calling in status post parathyroidectomy on 11/30/2012. She wants to know if it is okay for her to see her dentist about her bridge in her mouth and if it would be okay for her to see an orthopaedic MD about her pain in her hip. I advised both would be okay. She will call with any additional questions.

## 2013-02-04 LAB — PTH, INTACT AND CALCIUM: Calcium: 8.7 mg/dL (ref 8.4–10.5)

## 2013-02-08 ENCOUNTER — Ambulatory Visit (INDEPENDENT_AMBULATORY_CARE_PROVIDER_SITE_OTHER): Payer: Medicare Other | Admitting: Surgery

## 2013-02-08 ENCOUNTER — Telehealth (INDEPENDENT_AMBULATORY_CARE_PROVIDER_SITE_OTHER): Payer: Self-pay

## 2013-02-08 ENCOUNTER — Encounter (INDEPENDENT_AMBULATORY_CARE_PROVIDER_SITE_OTHER): Payer: Self-pay | Admitting: Surgery

## 2013-02-08 ENCOUNTER — Encounter (INDEPENDENT_AMBULATORY_CARE_PROVIDER_SITE_OTHER): Payer: Self-pay

## 2013-02-08 VITALS — BP 132/69 | HR 66 | Temp 97.1°F | Resp 14 | Ht 65.0 in | Wt 156.4 lb

## 2013-02-08 DIAGNOSIS — E210 Primary hyperparathyroidism: Secondary | ICD-10-CM

## 2013-02-08 NOTE — Patient Instructions (Signed)
Begin taking Vitamin D supplement 1000 - 2000 IU daily.  Begin taking calcium supplement 600 - 1200 mg daily.  Velora Heckler, MD, [facility] Office: [phone]

## 2013-02-08 NOTE — Progress Notes (Signed)
General Surgery [facility]  Chief Complaint  Patient presents with  . Routine Post Op    parathyroidectomy 12/02/2012    HISTORY: Patient is a 72 year old female who underwent minimally invasive parathyroidectomy in September 2014. Postoperatively her calcium levels have fallen nicely with her most recent level being normal at 8.7. However her postoperative intact PTH level has risen from 35 to an inexplicable level of 116.9.  Patient feels well. Her energy level has improved. Her appetite is good. She feels that her voice quality has also improved.  EXAM: Voice quality is normal at conversation level. Surgical incision is well-healed with a good cosmetic result. No palpable abnormality.  IMPRESSION: Status post minimally invasive parathyroidectomy  PLAN: Calcium levels are normal. I cannot explain the elevation in her intact PTH level postoperatively. This may be a laboratory error. I have seen this occur in the face of vitamin D deficiency. Therefore we will repeat her laboratory studies in 3 months. I will ask for a calcium level, and intact PTH level, and a 25 hydroxy vitamin D level. Patient will return for evaluation after those laboratory studies are completed.  Velora Heckler, MD, FACS General & Endocrine Surgery [facility]   Visit Diagnoses: 1. Hyperparathyroidism, primary

## 2013-02-08 NOTE — Telephone Encounter (Signed)
Lab slip for march 2015 given to pt.

## 2013-03-17 ENCOUNTER — Other Ambulatory Visit: Payer: Self-pay | Admitting: Physician Assistant

## 2013-05-10 ENCOUNTER — Telehealth (INDEPENDENT_AMBULATORY_CARE_PROVIDER_SITE_OTHER): Payer: Self-pay | Admitting: General Surgery

## 2013-05-10 LAB — PTH, INTACT AND CALCIUM
Calcium: 9.3 mg/dL (ref 8.4–10.5)
PTH: 48.4 pg/mL (ref 14.0–72.0)

## 2013-05-10 LAB — VITAMIN D 25 HYDROXY (VIT D DEFICIENCY, FRACTURES): Vit D, 25-Hydroxy: 53 ng/mL (ref 30–89)

## 2013-05-10 NOTE — Telephone Encounter (Signed)
Called patient to let her know that her Calcium, intact-PTH level and Vit D level all normal at this time.

## 2013-05-10 NOTE — Progress Notes (Signed)
Quick Note:  Calcium, intact-PTH level, and Vit D level all normal at this time.  Earnstine Regal, MD, [facility] Office: [phone]   ______

## 2013-06-14 ENCOUNTER — Encounter (INDEPENDENT_AMBULATORY_CARE_PROVIDER_SITE_OTHER): Payer: Self-pay | Admitting: Surgery

## 2013-06-20 ENCOUNTER — Encounter (INDEPENDENT_AMBULATORY_CARE_PROVIDER_SITE_OTHER): Payer: Self-pay | Admitting: Surgery

## 2013-06-20 ENCOUNTER — Ambulatory Visit (INDEPENDENT_AMBULATORY_CARE_PROVIDER_SITE_OTHER): Payer: Medicare Other | Admitting: Surgery

## 2013-06-20 VITALS — BP 118/70 | HR 66 | Temp 97.0°F | Resp 16 | Ht 66.0 in | Wt 152.4 lb

## 2013-06-20 DIAGNOSIS — E210 Primary hyperparathyroidism: Secondary | ICD-10-CM

## 2013-06-20 NOTE — Patient Instructions (Signed)
Vitamin D Test This is a test used to investigate a problem related to bone metabolism or parathyroid function. It is often used if you have an abnormal calcium, phosphorus, and/or parathyroid hormone level or if you have evidence of bone disease or bone weakness These tests measure the concentrations of various forms of vitamin D in your blood. The term vitamin D actually refers to a number of different but related chemical compounds (termed sterols), some of which are inactive and some of which are active forms. The two most common vitamin D tests measure calcidiol (an inactive form) and calcitriol (the active form). The test for calcidiol (sometimes called 25-hydroxy-vitamin D) is used to assure that the body has adequate vitamin D supply. The test for calcitriol (sometimes called 1,25 dihydroxy-vitamin D) is used to assure that the kidney is converting an appropriate amount of calcidiol to the active hormone calcitriol.  The main role of the active hormone calcitriol is to help regulate the absorption of calcium, phosphorus, and (to a lesser extent) magnesium. Vitamin D is vital for the growth and health of bone; without it, bones will be soft, malformed, and unable to repair themselves normally, resulting in diseases called rickets in children and osteomalacia in adults.  Vitamin D comes from two sources. The body is able to form vitamin D by exposure to sunlight. This is the basis of referring to vitamin D as the sunshine vitamin -- it is formed from 7-dehydrocholesterol in the skin when the skin is exposed to light. Vitamin D also can be ingested -- either in foods or in vitamin supplements. The different compounds of vitamin D are distinguished by the use of subscript numbers. Vitamin D2 comes from diet and vitamin preparations. Vitamin D3 is endogenous (produced in the body). Vitamins D2 and D3 are slightly different chemical structures, but both lead to production of the active hormone calcitriol.   PREPARATION FOR TEST A blood sample is obtained by inserting a needle into a vein in the arm. NORMAL FINDINGS Ranges for normal findings may vary among different laboratories and hospitals. You should always check with your doctor after having lab work or other tests done to discuss the meaning of your test results and whether your values are considered within normal limits. MEANING OF TEST  Your caregiver will go over the test results with you and discuss the importance and meaning of your results, as well as treatment options and the need for additional tests, if necessary. Reference values are dependent on many factors, including patient age, gender, sample population, and testing method. Numeric test results have different meanings in different labs. Your lab report should include the specific reference range for your test.   Low blood levels of calcidiol may mean that you are not getting enough exposure to sunlight or enough dietary vitamin D to meet your body's demand; that there is a problem with its absorption from the intestines; or that enough is not being converted to calcidiol in the liver (which means that it is not making it into the bloodstream). Occasionally, drugs used to treat seizures, particularly phenytoin (Dilantin), can interfere with the liver's production of calcidiol.  High levels of calcidiol usually reflect excess supplementation from vitamin pills or other nutritional supplements.  Low levels of calcitriol are often seen in kidney disease and are one of the earliest changes to occur in persons with early kidney failure.  High levels of calcitriol may occur when there is excess parathryoid hormone or when there are diseases,  such as sarcoidosis or some lymphomas that can make calcitriol outside of the kidneys. OBTAINING THE TEST RESULTS It is your responsibility to obtain your test results. Ask the lab or department performing the test when and how you will get your  results. Document Released: 03/21/2004 Document Revised: 05/19/2011 Document Reviewed: 02/24/2005 [facility] Patient Information 2014 [place].

## 2013-06-20 NOTE — Progress Notes (Signed)
General Surgery [facility]  Chief Complaint  Tina Frank presents with  . Follow-up    parathyroidectomy in Sept 2014 - repeat lab studies for review    HISTORY: Tina Frank is a 73 year old female who underwent parathyroidectomy in September 2014. Postoperatively her calcium levels normalized. However she had a persistently elevated PTH level.  We repeated the Tina Frank's laboratories in March 2015. She now has a normal vitamin D level of 53 and continues to take a daily vitamin D supplement. Her calcium remains normal at 9.3. Her intact PTH level is now normal at 48.4.  Tina Frank is anticipating knee replacement surgery in May 2015.  PERTINENT REVIEW OF SYSTEMS: Tina Frank notes that her voice is stronger and essentially normal. She denies dysphagia.  EXAM: HEENT: normocephalic; pupils equal and reactive; sclerae clear; dentition good; mucous membranes moist NECK:  Well-healed surgical incision with good cosmetic result; symmetric on extension; no palpable anterior or posterior cervical lymphadenopathy; no supraclavicular masses; no tenderness CHEST: clear to auscultation bilaterally without rales, rhonchi, or wheezes CARDIAC: regular rate and rhythm without significant murmur; peripheral pulses are full EXT:  non-tender without edema; no deformity NEURO: no gross focal deficits; no sign of tremor   IMPRESSION: Personal history of primary hyperparathyroidism, now with normal laboratory studies following parathyroidectomy  PLAN: Tina Frank is released from further surgical follow-up at this point. She plans to proceed with knee replacement surgery next month.  Tina Frank will return to my practice for surgical care as needed.  Earnstine Regal, MD, [facility] Office: [phone]  Visit Diagnoses: 1. Hyperparathyroidism, primary

## 2013-07-11 ENCOUNTER — Other Ambulatory Visit: Payer: Self-pay | Admitting: Physician Assistant

## 2013-07-11 NOTE — H&P (Signed)
TOTAL HIP ADMISSION H&P  Patient is admitted for left total hip arthroplasty.  Subjective:  Chief Complaint: left hip pain  HPI: Tina Frank, 73 y.o. female, has a history of pain and functional disability in the left hip(s) due to arthritis and patient has failed non-surgical conservative treatments for greater than 12 weeks to include NSAID's and/or analgesics, corticosteriod injections and activity modification.  Onset of symptoms was gradual starting 1 years ago with gradually worsening course since that time.The patient noted no past surgery on the left hip(s).  Patient currently rates pain in the left hip at 7 out of 10 with activity. Patient has night pain, worsening of pain with activity and weight bearing, trendelenberg gait, pain that interfers with activities of daily living, pain with passive range of motion and joint swelling. Patient has evidence of subchondral sclerosis and joint space narrowing by imaging studies. This condition presents safety issues increasing the risk of falls.  There is no current active infection.  Patient Active Problem List   Diagnosis Date Noted  . Hypotension (arterial) 10/28/2011  . HTN (hypertension) with goal to be determined 10/28/2011  . DM (diabetes mellitus) 10/28/2011  . Hyperlipidemia 10/28/2011  . Obesity   . Reflux esophagitis   . Herpes zoster   . Shingles    Past Medical History  Diagnosis Date  . Obesity     is resolve- pt. highest weight was 180 10 yrs ago.  Marland Kitchen Hypertension   . Hyperlipidemia   . Reflux esophagitis   . Herpes zoster   . Shingles     pt states she didn't have this on 10/28/11, given"shingle vaccine" only  . Diabetes mellitus      5 yrs or more-oral meds control    Past Surgical History  Procedure Laterality Date  . No past surgeries    . Colonoscopy    . Childbirth      x2  . Eye surgery      laser surgery for retinal tear.  . Parathyroidectomy N/A 12/02/2012    Procedure: PARATHYROIDECTOMY with frozen  section ;  Surgeon: Earnstine Regal, MD;  Location: [facility];  Service: General;  Laterality: N/A;     (Not in a hospital admission) Allergies  Allergen Reactions  . Crestor [Rosuvastatin]     Feels "achey".  . Norvasc [Amlodipine Besylate] Swelling  . Simvastatin     Feels achey  . Zetia [Ezetimibe]     Feels achey  . Sulfa Antibiotics Rash    History  Substance Use Topics  . Smoking status: Former Smoker -- 20 years    Quit date: 10/28/1987  . Smokeless tobacco: Never Used  . Alcohol Use: No    Family History  Problem Relation Age of Onset  . Heart failure Mother   . Cancer Mother   . Cancer Father      Review of Systems  Constitutional: Positive for chills. Negative for fever, weight loss and malaise/fatigue.  HENT: Positive for tinnitus. Negative for ear discharge, ear pain and hearing loss.   Eyes: Negative.   Respiratory: Negative.   Cardiovascular: Negative.   Gastrointestinal: Negative.   Genitourinary: Negative.   Musculoskeletal: Positive for joint pain.  Skin: Negative.   Neurological: Negative.  Negative for headaches.  Endo/Heme/Allergies: Negative.   Psychiatric/Behavioral: Negative.     Objective:  Physical Exam  Constitutional: She is oriented to person, place, and time. She appears well-developed and well-nourished.  HENT:  Head: Normocephalic and atraumatic.  Eyes: EOM are  normal. Pupils are equal, round, and reactive to light.  Neck: Normal range of motion. Neck supple.  Cardiovascular: Normal rate and regular rhythm.  Exam reveals no gallop and no friction rub.   No murmur heard. Respiratory: Effort normal and breath sounds normal. No respiratory distress. She has no wheezes. She has no rales.  GI: Soft. Bowel sounds are normal. She exhibits no distension.  Musculoskeletal:  Antalgic gait with Trendelenburg component on the left. Some central obesity, not marked.  A little short on the left hip.  Markedly positive log roll with loss of rotation  significantly both internal and external rotation and is painful.  Not really much pain with straight leg raising.  A little soreness laterally, but not true trochanteric bursitis.  Neurological: She is alert and oriented to person, place, and time.  Skin: Skin is warm and dry.  Psychiatric: She has a normal mood and affect. Her behavior is normal. Judgment and thought content normal.    Vital signs in last 24 hours: @VSRANGES @  Labs:   Estimated body mass index is 24.61 kg/(m^2) as calculated from the following:   Height as of 06/20/13: 5\' 6"  (1.676 m).   Weight as of 06/20/13: 69.128 kg (152 lb 6.4 oz).   Imaging Review Plain radiographs demonstrate severe degenerative joint disease of the left hip(s). The bone quality appears to be fair for age and reported activity level.  Assessment/Plan:  End stage arthritis, left hip(s)  The patient history, physical examination, clinical judgement of the provider and imaging studies are consistent with end stage degenerative joint disease of the left hip(s) and total hip arthroplasty is deemed medically necessary. The treatment options including medical management, injection therapy, arthroscopy and arthroplasty were discussed at length. The risks and benefits of total hip arthroplasty were presented and reviewed. The risks due to aseptic loosening, infection, stiffness, dislocation/subluxation,  thromboembolic complications and other imponderables were discussed.  The patient acknowledged the explanation, agreed to proceed with the plan and consent was signed. Patient is being admitted for inpatient treatment for surgery, pain control, PT, OT, prophylactic antibiotics, VTE prophylaxis, progressive ambulation and ADL's and discharge planning.The patient is planning to be discharged home with home health services

## 2013-07-14 ENCOUNTER — Encounter (HOSPITAL_COMMUNITY): Payer: Self-pay | Admitting: Pharmacy Technician

## 2013-07-19 ENCOUNTER — Encounter (HOSPITAL_COMMUNITY): Payer: Self-pay

## 2013-07-19 ENCOUNTER — Encounter (HOSPITAL_COMMUNITY)
Admission: RE | Admit: 2013-07-19 | Discharge: 2013-07-19 | Disposition: A | Discharge status: Home / Self Care | Payer: Medicare Other | Source: Ambulatory Visit | Attending: Orthopedic Surgery | Admitting: Orthopedic Surgery

## 2013-07-19 DIAGNOSIS — Z01812 Encounter for preprocedural laboratory examination: Secondary | ICD-10-CM | POA: Insufficient documentation

## 2013-07-19 HISTORY — DX: Unspecified osteoarthritis, unspecified site: M19.90

## 2013-07-19 HISTORY — DX: Gastro-esophageal reflux disease without esophagitis: K21.9

## 2013-07-19 LAB — URINE MICROSCOPIC-ADD ON

## 2013-07-19 LAB — COMPREHENSIVE METABOLIC PANEL
ALBUMIN: 3.4 g/dL — AB (ref 3.5–5.2)
ALT: 12 U/L (ref 0–35)
AST: 17 U/L (ref 0–37)
Alkaline Phosphatase: 91 U/L (ref 39–117)
BUN: 18 mg/dL (ref 6–23)
CALCIUM: 9.8 mg/dL (ref 8.4–10.5)
CHLORIDE: 98 meq/L (ref 96–112)
CO2: 28 mEq/L (ref 19–32)
Creatinine, Ser: 0.72 mg/dL (ref 0.50–1.10)
GFR calc Af Amer: >90 mL/min (ref >90)
GFR calc non Af Amer: 83 mL/min — ABNORMAL LOW (ref >90)
Glucose, Bld: 109 mg/dL — ABNORMAL HIGH (ref 70–99)
Potassium: 3.4 mEq/L — ABNORMAL LOW (ref 3.7–5.3)
Sodium: 138 mEq/L (ref 137–147)
Total Bilirubin: 0.5 mg/dL (ref 0.3–1.2)
Total Protein: 7 g/dL (ref 6.0–8.3)

## 2013-07-19 LAB — CBC WITH DIFFERENTIAL/PLATELET
BASOS ABS: 0 10*3/uL (ref 0.0–0.1)
BASOS PCT: 0 % (ref 0–1)
Eosinophils Absolute: 0.2 10*3/uL (ref 0.0–0.7)
Eosinophils Relative: 3 % (ref 0–5)
HCT: 39.9 % (ref 36.0–46.0)
Hemoglobin: 13.6 g/dL (ref 12.0–15.0)
LYMPHS PCT: 19 % (ref 12–46)
Lymphs Abs: 1.3 10*3/uL (ref 0.7–4.0)
MCH: 28.7 pg (ref 26.0–34.0)
MCHC: 34.1 g/dL (ref 30.0–36.0)
MCV: 84.2 fL (ref 78.0–100.0)
Monocytes Absolute: 0.8 10*3/uL (ref 0.1–1.0)
Monocytes Relative: 11 % (ref 3–12)
Neutro Abs: 4.7 10*3/uL (ref 1.7–7.7)
Neutrophils Relative %: 67 % (ref 43–77)
PLATELETS: 239 10*3/uL (ref 150–400)
RBC: 4.74 MIL/uL (ref 3.87–5.11)
RDW: 12.8 % (ref 11.5–15.5)
WBC: 7 10*3/uL (ref 4.0–10.5)

## 2013-07-19 LAB — SURGICAL PCR SCREEN
MRSA, PCR: NEGATIVE
STAPHYLOCOCCUS AUREUS: NEGATIVE

## 2013-07-19 LAB — URINALYSIS, ROUTINE W REFLEX MICROSCOPIC
BILIRUBIN URINE: NEGATIVE
GLUCOSE, UA: NEGATIVE mg/dL
Hgb urine dipstick: NEGATIVE
KETONES UR: NEGATIVE mg/dL
Nitrite: NEGATIVE
Protein, ur: NEGATIVE mg/dL
Specific Gravity, Urine: 1.012 (ref 1.005–1.030)
Urobilinogen, UA: 0.2 mg/dL (ref 0.0–1.0)
pH: 7 (ref 5.0–8.0)

## 2013-07-19 LAB — TYPE AND SCREEN
ABO/RH(D): O POS
Antibody Screen: NEGATIVE

## 2013-07-19 LAB — ABO/RH: ABO/RH(D): O POS

## 2013-07-19 LAB — APTT: aPTT: 32 seconds (ref 24–37)

## 2013-07-19 LAB — PROTIME-INR
INR: 0.95 (ref 0.00–1.49)
Prothrombin Time: 12.5 seconds (ref 11.6–15.2)

## 2013-07-19 NOTE — Progress Notes (Signed)
BP is 89/56, rechecked 80/53.  Mr Sopher reported that she took Clonidine this am, because SBP was greater than 135.  Patient denies lightheadedness or dizziness.  Patient is going to call Dr Mercy Moore and tell him how much her BP is dropping with Clonidine.  Patient rechecked BP with her BP machine and it was 110/ 54.

## 2013-07-19 NOTE — Pre-Procedure Instructions (Signed)
Tina Frank  07/19/2013   Your procedure is scheduled on:  Wednesday, May 20.  Report to [facility], Main Entrance/Entrance "A" at 6:30AM.  Call this number if you have problems the morning of surgery: [phone]   Remember:   Do not eat food or drink liquids after midnight.   Take these medicines the morning of surgery with A SIP OF WATER: carvedilol (COREG), cloNIDine (CATAPRES), hydrALAZINE (APRESOLINE).              Take if needed:esomeprazole ([place]).              Stop taking Aspirin and Multi- Vitamin today.               Do not take medications for Diabetes the morning of surgery.   Do not wear jewelry, make-up or nail polish.  Do not wear lotions, powders, or perfumes.  Do not shave 48 hours prior to surgery.   Do not bring valuables to the hospital.             [facility] is not responsible for any belongings or valuables.               Contacts, dentures or bridgework may not be worn into surgery.  Leave suitcase in the car. After surgery it may be brought to your room.  For patients admitted to the hospital, discharge time is determined by yourtreatment team.                Special Instructions: Review  [facility] - Preparing For Surgery.   Please read over the following fact sheets that you were given: Pain Booklet, Coughing and Deep Breathing, Blood Transfusion Information and Surgical Site Infection Prevention and Incentive Spirometry.

## 2013-07-20 LAB — URINE CULTURE: Colony Count: >=100000

## 2013-07-26 MED ORDER — CEFAZOLIN SODIUM-DEXTROSE 2-3 GM-% IV SOLR
2.0000 g | INTRAVENOUS | Status: AC
  Administered 2013-07-27: 2 g via INTRAVENOUS
  Filled 2013-07-26: qty 50

## 2013-07-26 MED ORDER — CHLORHEXIDINE GLUCONATE 4 % EX LIQD
60.0000 mL | Freq: Once | CUTANEOUS | Status: DC
  Filled 2013-07-26: qty 60

## 2013-07-26 MED ORDER — LACTATED RINGERS IV SOLN: INTRAVENOUS | Status: DC

## 2013-07-26 NOTE — Interval H&P Note (Signed)
History and Physical Interval Note:  07/26/2013 12:28 PM  Tina Frank  has presented today for surgery, with the diagnosis of djd left hip  The various methods of treatment have been discussed with the patient and family. After consideration of risks, benefits and other options for treatment, the patient has consented to  Procedure(s): TOTAL HIP ARTHROPLASTY ANTERIOR APPROACH (Left) as a surgical intervention .  The patient's history has been reviewed, patient examined, no change in status, stable for surgery.  I have reviewed the patient's chart and labs.  Questions were answered to the patient's satisfaction.     [name]

## 2013-07-26 NOTE — H&P (View-Only) (Signed)
TOTAL HIP ADMISSION H&P  Patient is admitted for left total hip arthroplasty.  Subjective:  Chief Complaint: left hip pain  HPI: Tina Frank, 73 y.o. female, has a history of pain and functional disability in the left hip(s) due to arthritis and patient has failed non-surgical conservative treatments for greater than 12 weeks to include NSAID's and/or analgesics, corticosteriod injections and activity modification.  Onset of symptoms was gradual starting 1 years ago with gradually worsening course since that time.The patient noted no past surgery on the left hip(s).  Patient currently rates pain in the left hip at 7 out of 10 with activity. Patient has night pain, worsening of pain with activity and weight bearing, trendelenberg gait, pain that interfers with activities of daily living, pain with passive range of motion and joint swelling. Patient has evidence of subchondral sclerosis and joint space narrowing by imaging studies. This condition presents safety issues increasing the risk of falls.  There is no current active infection.  Patient Active Problem List   Diagnosis Date Noted  . Hypotension (arterial) 10/28/2011  . HTN (hypertension) with goal to be determined 10/28/2011  . DM (diabetes mellitus) 10/28/2011  . Hyperlipidemia 10/28/2011  . Obesity   . Reflux esophagitis   . Herpes zoster   . Shingles    Past Medical History  Diagnosis Date  . Obesity     is resolve- pt. highest weight was 180 10 yrs ago.  Marland Kitchen Hypertension   . Hyperlipidemia   . Reflux esophagitis   . Herpes zoster   . Shingles     pt states she didn't have this on 10/28/11, given"shingle vaccine" only  . Diabetes mellitus      5 yrs or more-oral meds control    Past Surgical History  Procedure Laterality Date  . No past surgeries    . Colonoscopy    . Childbirth      x2  . Eye surgery      laser surgery for retinal tear.  . Parathyroidectomy N/A 12/02/2012    Procedure: PARATHYROIDECTOMY with frozen  section ;  Surgeon: Earnstine Regal, MD;  Location: [facility];  Service: General;  Laterality: N/A;     (Not in a hospital admission) Allergies  Allergen Reactions  . Crestor [Rosuvastatin]     Feels "achey".  . Norvasc [Amlodipine Besylate] Swelling  . Simvastatin     Feels achey  . Zetia [Ezetimibe]     Feels achey  . Sulfa Antibiotics Rash    History  Substance Use Topics  . Smoking status: Former Smoker -- 20 years    Quit date: 10/28/1987  . Smokeless tobacco: Never Used  . Alcohol Use: No    Family History  Problem Relation Age of Onset  . Heart failure Mother   . Cancer Mother   . Cancer Father      Review of Systems  Constitutional: Positive for chills. Negative for fever, weight loss and malaise/fatigue.  HENT: Positive for tinnitus. Negative for ear discharge, ear pain and hearing loss.   Eyes: Negative.   Respiratory: Negative.   Cardiovascular: Negative.   Gastrointestinal: Negative.   Genitourinary: Negative.   Musculoskeletal: Positive for joint pain.  Skin: Negative.   Neurological: Negative.  Negative for headaches.  Endo/Heme/Allergies: Negative.   Psychiatric/Behavioral: Negative.     Objective:  Physical Exam  Constitutional: She is oriented to person, place, and time. She appears well-developed and well-nourished.  HENT:  Head: Normocephalic and atraumatic.  Eyes: EOM are  normal. Pupils are equal, round, and reactive to light.  Neck: Normal range of motion. Neck supple.  Cardiovascular: Normal rate and regular rhythm.  Exam reveals no gallop and no friction rub.   No murmur heard. Respiratory: Effort normal and breath sounds normal. No respiratory distress. She has no wheezes. She has no rales.  GI: Soft. Bowel sounds are normal. She exhibits no distension.  Musculoskeletal:  Antalgic gait with Trendelenburg component on the left. Some central obesity, not marked.  A little short on the left hip.  Markedly positive log roll with loss of rotation  significantly both internal and external rotation and is painful.  Not really much pain with straight leg raising.  A little soreness laterally, but not true trochanteric bursitis.  Neurological: She is alert and oriented to person, place, and time.  Skin: Skin is warm and dry.  Psychiatric: She has a normal mood and affect. Her behavior is normal. Judgment and thought content normal.    Vital signs in last 24 hours: @VSRANGES @  Labs:   Estimated body mass index is 24.61 kg/(m^2) as calculated from the following:   Height as of 06/20/13: 5\' 6"  (1.676 m).   Weight as of 06/20/13: 69.128 kg (152 lb 6.4 oz).   Imaging Review Plain radiographs demonstrate severe degenerative joint disease of the left hip(s). The bone quality appears to be fair for age and reported activity level.  Assessment/Plan:  End stage arthritis, left hip(s)  The patient history, physical examination, clinical judgement of the provider and imaging studies are consistent with end stage degenerative joint disease of the left hip(s) and total hip arthroplasty is deemed medically necessary. The treatment options including medical management, injection therapy, arthroscopy and arthroplasty were discussed at length. The risks and benefits of total hip arthroplasty were presented and reviewed. The risks due to aseptic loosening, infection, stiffness, dislocation/subluxation,  thromboembolic complications and other imponderables were discussed.  The patient acknowledged the explanation, agreed to proceed with the plan and consent was signed. Patient is being admitted for inpatient treatment for surgery, pain control, PT, OT, prophylactic antibiotics, VTE prophylaxis, progressive ambulation and ADL's and discharge planning.The patient is planning to be discharged home with home health services

## 2013-07-27 ENCOUNTER — Encounter (HOSPITAL_COMMUNITY): Payer: Self-pay | Admitting: *Deleted

## 2013-07-27 ENCOUNTER — Inpatient Hospital Stay (HOSPITAL_COMMUNITY)
Admission: RE | Admit: 2013-07-27 | Discharge: 2013-07-29 | DRG: 470 | Disposition: A | Discharge status: Home Health | Payer: Medicare Other | Source: Ambulatory Visit | Attending: Orthopedic Surgery | Admitting: Orthopedic Surgery

## 2013-07-27 ENCOUNTER — Inpatient Hospital Stay (HOSPITAL_COMMUNITY): Payer: Medicare Other

## 2013-07-27 ENCOUNTER — Encounter (HOSPITAL_COMMUNITY): Payer: Medicare Other | Admitting: Anesthesiology

## 2013-07-27 ENCOUNTER — Encounter (HOSPITAL_COMMUNITY)
Admission: RE | Disposition: A | Discharge status: Home Health | Payer: Self-pay | Source: Ambulatory Visit | Attending: Orthopedic Surgery

## 2013-07-27 ENCOUNTER — Inpatient Hospital Stay (HOSPITAL_COMMUNITY): Payer: Medicare Other | Admitting: Anesthesiology

## 2013-07-27 DIAGNOSIS — E669 Obesity, unspecified: Secondary | ICD-10-CM | POA: Diagnosis present

## 2013-07-27 DIAGNOSIS — K219 Gastro-esophageal reflux disease without esophagitis: Secondary | ICD-10-CM | POA: Diagnosis present

## 2013-07-27 DIAGNOSIS — M1610 Unilateral primary osteoarthritis, unspecified hip: Principal | ICD-10-CM | POA: Diagnosis present

## 2013-07-27 DIAGNOSIS — Z79899 Other long term (current) drug therapy: Secondary | ICD-10-CM

## 2013-07-27 DIAGNOSIS — Z888 Allergy status to other drugs, medicaments and biological substances status: Secondary | ICD-10-CM

## 2013-07-27 DIAGNOSIS — Z8249 Family history of ischemic heart disease and other diseases of the circulatory system: Secondary | ICD-10-CM

## 2013-07-27 DIAGNOSIS — IMO0002 Reserved for concepts with insufficient information to code with codable children: Secondary | ICD-10-CM

## 2013-07-27 DIAGNOSIS — M1990 Unspecified osteoarthritis, unspecified site: Secondary | ICD-10-CM | POA: Diagnosis present

## 2013-07-27 DIAGNOSIS — I10 Essential (primary) hypertension: Secondary | ICD-10-CM | POA: Diagnosis present

## 2013-07-27 DIAGNOSIS — M25819 Other specified joint disorders, unspecified shoulder: Secondary | ICD-10-CM | POA: Diagnosis present

## 2013-07-27 DIAGNOSIS — Z87891 Personal history of nicotine dependence: Secondary | ICD-10-CM

## 2013-07-27 DIAGNOSIS — M7580 Other shoulder lesions, unspecified shoulder: Secondary | ICD-10-CM

## 2013-07-27 DIAGNOSIS — E119 Type 2 diabetes mellitus without complications: Secondary | ICD-10-CM | POA: Diagnosis present

## 2013-07-27 DIAGNOSIS — E785 Hyperlipidemia, unspecified: Secondary | ICD-10-CM | POA: Diagnosis present

## 2013-07-27 DIAGNOSIS — Z882 Allergy status to sulfonamides status: Secondary | ICD-10-CM

## 2013-07-27 DIAGNOSIS — Z7982 Long term (current) use of aspirin: Secondary | ICD-10-CM

## 2013-07-27 HISTORY — PX: SHOULDER INJECTION: SHX5048

## 2013-07-27 HISTORY — PX: TOTAL HIP ARTHROPLASTY: SHX124

## 2013-07-27 LAB — GLUCOSE, CAPILLARY
Glucose-Capillary: 142 mg/dL — ABNORMAL HIGH (ref 70–99)
Glucose-Capillary: 148 mg/dL — ABNORMAL HIGH (ref 70–99)
Glucose-Capillary: 156 mg/dL — ABNORMAL HIGH (ref 70–99)
Glucose-Capillary: 128 mg/dL — ABNORMAL HIGH (ref 70–99)

## 2013-07-27 SURGERY — ARTHROPLASTY, HIP, TOTAL, ANTERIOR APPROACH
Anesthesia: General | Site: Shoulder | Laterality: Left

## 2013-07-27 MED ORDER — ATORVASTATIN CALCIUM 10 MG PO TABS
10.0000 mg | ORAL_TABLET | Freq: Every day | ORAL | Status: DC
  Administered 2013-07-27 – 2013-07-28 (×2): 10 mg via ORAL
  Filled 2013-07-27 (×3): qty 1

## 2013-07-27 MED ORDER — NEOSTIGMINE METHYLSULFATE 10 MG/10ML IV SOLN
INTRAVENOUS | Status: AC
  Filled 2013-07-27: qty 1

## 2013-07-27 MED ORDER — CLONIDINE HCL 0.1 MG PO TABS
0.1000 mg | ORAL_TABLET | Freq: Every day | ORAL | Status: DC | PRN
  Filled 2013-07-27: qty 1

## 2013-07-27 MED ORDER — ARTIFICIAL TEARS OP OINT
TOPICAL_OINTMENT | OPHTHALMIC | Status: DC | PRN
  Administered 2013-07-27: 1 via OPHTHALMIC

## 2013-07-27 MED ORDER — PANTOPRAZOLE SODIUM 40 MG PO TBEC
80.0000 mg | DELAYED_RELEASE_TABLET | Freq: Every day | ORAL | Status: DC
  Administered 2013-07-28 – 2013-07-29 (×2): 80 mg via ORAL
  Filled 2013-07-27: qty 2

## 2013-07-27 MED ORDER — GLYCOPYRROLATE 0.2 MG/ML IJ SOLN
INTRAMUSCULAR | Status: DC | PRN
  Administered 2013-07-27: .4 mg via INTRAVENOUS
  Administered 2013-07-27: 0.2 mg via INTRAVENOUS

## 2013-07-27 MED ORDER — SODIUM CHLORIDE 0.9 % IJ SOLN
INTRAMUSCULAR | Status: DC | PRN
  Administered 2013-07-27: 40 mL

## 2013-07-27 MED ORDER — METOCLOPRAMIDE HCL 5 MG/ML IJ SOLN: 5.0000 mg | Freq: Three times a day (TID) | INTRAMUSCULAR | Status: DC | PRN

## 2013-07-27 MED ORDER — METHOCARBAMOL 500 MG PO TABS: 500.0000 mg | ORAL_TABLET | Freq: Four times a day (QID) | ORAL | Status: DC

## 2013-07-27 MED ORDER — HYDRALAZINE HCL 50 MG PO TABS
50.0000 mg | ORAL_TABLET | Freq: Two times a day (BID) | ORAL | Status: DC
  Administered 2013-07-28: 50 mg via ORAL
  Filled 2013-07-27 (×5): qty 1

## 2013-07-27 MED ORDER — METHOCARBAMOL 500 MG PO TABS
500.0000 mg | ORAL_TABLET | Freq: Four times a day (QID) | ORAL | Status: DC | PRN
  Administered 2013-07-27 – 2013-07-29 (×3): 500 mg via ORAL
  Filled 2013-07-27 (×2): qty 1

## 2013-07-27 MED ORDER — SUCCINYLCHOLINE CHLORIDE 20 MG/ML IJ SOLN
INTRAMUSCULAR | Status: AC
  Filled 2013-07-27: qty 1

## 2013-07-27 MED ORDER — ONDANSETRON HCL 4 MG/2ML IJ SOLN
4.0000 mg | Freq: Four times a day (QID) | INTRAMUSCULAR | Status: DC | PRN
  Administered 2013-07-28: 4 mg via INTRAVENOUS
  Filled 2013-07-27: qty 2

## 2013-07-27 MED ORDER — OXYCODONE HCL 5 MG PO TABS
ORAL_TABLET | ORAL | Status: AC
  Filled 2013-07-27: qty 1

## 2013-07-27 MED ORDER — METHYLPREDNISOLONE ACETATE 80 MG/ML IJ SUSP
INTRAMUSCULAR | Status: AC
  Filled 2013-07-27: qty 1

## 2013-07-27 MED ORDER — LACTATED RINGERS IV SOLN
INTRAVENOUS | Status: DC | PRN
  Administered 2013-07-27 (×2): via INTRAVENOUS

## 2013-07-27 MED ORDER — 0.9 % SODIUM CHLORIDE (POUR BTL) OPTIME
TOPICAL | Status: DC | PRN
  Administered 2013-07-27: 1000 mL

## 2013-07-27 MED ORDER — ONDANSETRON HCL 4 MG PO TABS
4.0000 mg | ORAL_TABLET | Freq: Four times a day (QID) | ORAL | Status: DC | PRN
  Administered 2013-07-28 – 2013-07-29 (×2): 4 mg via ORAL
  Filled 2013-07-27 (×2): qty 1

## 2013-07-27 MED ORDER — BISACODYL 5 MG PO TBEC: 5.0000 mg | DELAYED_RELEASE_TABLET | Freq: Every day | ORAL | Status: DC | PRN

## 2013-07-27 MED ORDER — LIDOCAINE HCL (CARDIAC) 20 MG/ML IV SOLN
INTRAVENOUS | Status: DC | PRN
Start: 2013-07-27 — End: 2013-07-27
  Administered 2013-07-27: 60 mg via INTRAVENOUS

## 2013-07-27 MED ORDER — IRBESARTAN 150 MG PO TABS
150.0000 mg | ORAL_TABLET | Freq: Every day | ORAL | Status: DC
  Filled 2013-07-27 (×3): qty 1

## 2013-07-27 MED ORDER — EPHEDRINE SULFATE 50 MG/ML IJ SOLN
INTRAMUSCULAR | Status: DC | PRN
  Administered 2013-07-27 (×2): 10 mg via INTRAVENOUS
  Administered 2013-07-27: 5 mg via INTRAVENOUS
  Administered 2013-07-27: 10 mg via INTRAVENOUS

## 2013-07-27 MED ORDER — ARTIFICIAL TEARS OP OINT
TOPICAL_OINTMENT | OPHTHALMIC | Status: AC
  Filled 2013-07-27: qty 3.5

## 2013-07-27 MED ORDER — ROCURONIUM BROMIDE 100 MG/10ML IV SOLN
INTRAVENOUS | Status: DC | PRN
  Administered 2013-07-27: 40 mg via INTRAVENOUS

## 2013-07-27 MED ORDER — ONDANSETRON HCL 4 MG/2ML IJ SOLN: 4.0000 mg | Freq: Once | INTRAMUSCULAR | Status: DC | PRN

## 2013-07-27 MED ORDER — PROPOFOL 10 MG/ML IV BOLUS
INTRAVENOUS | Status: DC | PRN
  Administered 2013-07-27: 170 mg via INTRAVENOUS

## 2013-07-27 MED ORDER — PROPOFOL 10 MG/ML IV BOLUS
INTRAVENOUS | Status: AC
  Filled 2013-07-27: qty 20

## 2013-07-27 MED ORDER — HYDROMORPHONE HCL PF 1 MG/ML IJ SOLN: 0.5000 mg | INTRAMUSCULAR | Status: DC | PRN

## 2013-07-27 MED ORDER — ACETAMINOPHEN 325 MG PO TABS: 650.0000 mg | ORAL_TABLET | Freq: Four times a day (QID) | ORAL | Status: DC | PRN

## 2013-07-27 MED ORDER — ASPIRIN EC 325 MG PO TBEC: 325.0000 mg | DELAYED_RELEASE_TABLET | Freq: Every day | ORAL | Status: DC

## 2013-07-27 MED ORDER — HYDROMORPHONE HCL PF 1 MG/ML IJ SOLN
INTRAMUSCULAR | Status: AC
  Filled 2013-07-27: qty 1

## 2013-07-27 MED ORDER — BUPIVACAINE LIPOSOME 1.3 % IJ SUSP
20.0000 mL | INTRAMUSCULAR | Status: AC
  Administered 2013-07-27: 20 mL
  Filled 2013-07-27: qty 20

## 2013-07-27 MED ORDER — ROCURONIUM BROMIDE 50 MG/5ML IV SOLN
INTRAVENOUS | Status: AC
  Filled 2013-07-27: qty 1

## 2013-07-27 MED ORDER — DIPHENHYDRAMINE HCL 12.5 MG/5ML PO ELIX
12.5000 mg | ORAL_SOLUTION | ORAL | Status: DC | PRN
Start: 2013-07-27 — End: 2013-07-29

## 2013-07-27 MED ORDER — METFORMIN HCL ER 500 MG PO TB24
500.0000 mg | ORAL_TABLET | Freq: Every day | ORAL | Status: DC
  Administered 2013-07-28 – 2013-07-29 (×2): 500 mg via ORAL
  Filled 2013-07-27 (×3): qty 1

## 2013-07-27 MED ORDER — ACETAMINOPHEN 650 MG RE SUPP: 650.0000 mg | Freq: Four times a day (QID) | RECTAL | Status: DC | PRN

## 2013-07-27 MED ORDER — CARVEDILOL 12.5 MG PO TABS
12.5000 mg | ORAL_TABLET | Freq: Two times a day (BID) | ORAL | Status: DC
  Administered 2013-07-28: 12.5 mg via ORAL
  Filled 2013-07-27 (×7): qty 1

## 2013-07-27 MED ORDER — HYDROMORPHONE HCL PF 1 MG/ML IJ SOLN
0.2500 mg | INTRAMUSCULAR | Status: DC | PRN
  Administered 2013-07-27: 0.25 mg via INTRAVENOUS
  Administered 2013-07-27: 0.5 mg via INTRAVENOUS

## 2013-07-27 MED ORDER — ONDANSETRON HCL 4 MG/2ML IJ SOLN
INTRAMUSCULAR | Status: AC
  Filled 2013-07-27: qty 2

## 2013-07-27 MED ORDER — EPHEDRINE SULFATE 50 MG/ML IJ SOLN
INTRAMUSCULAR | Status: AC
  Filled 2013-07-27: qty 1

## 2013-07-27 MED ORDER — SODIUM CHLORIDE 0.9 % IR SOLN
Status: DC | PRN
  Administered 2013-07-27: 1000 mL

## 2013-07-27 MED ORDER — ONDANSETRON HCL 4 MG/2ML IJ SOLN
INTRAMUSCULAR | Status: DC | PRN
Start: 2013-07-27 — End: 2013-07-27
  Administered 2013-07-27: 4 mg via INTRAVENOUS

## 2013-07-27 MED ORDER — MENTHOL 3 MG MT LOZG
1.0000 | LOZENGE | OROMUCOSAL | Status: DC | PRN
  Administered 2013-07-29: 3 mg via ORAL
  Filled 2013-07-27: qty 9

## 2013-07-27 MED ORDER — ASPIRIN EC 325 MG PO TBEC
325.0000 mg | DELAYED_RELEASE_TABLET | Freq: Every day | ORAL | Status: DC
  Administered 2013-07-28 – 2013-07-29 (×2): 325 mg via ORAL
  Filled 2013-07-27 (×3): qty 1

## 2013-07-27 MED ORDER — OXYCODONE HCL 5 MG PO TABS
5.0000 mg | ORAL_TABLET | ORAL | Status: DC | PRN
  Administered 2013-07-27: 10 mg via ORAL
  Administered 2013-07-27 – 2013-07-28 (×2): 5 mg via ORAL
  Administered 2013-07-28 – 2013-07-29 (×3): 10 mg via ORAL
  Filled 2013-07-27: qty 2
  Filled 2013-07-27: qty 1
  Filled 2013-07-27 (×4): qty 2

## 2013-07-27 MED ORDER — FENTANYL CITRATE 0.05 MG/ML IJ SOLN
INTRAMUSCULAR | Status: AC
  Filled 2013-07-27: qty 5

## 2013-07-27 MED ORDER — METHYLPREDNISOLONE ACETATE 80 MG/ML IJ SUSP
INTRAMUSCULAR | Status: DC | PRN
  Administered 2013-07-27: 80 mg via INTRAMUSCULAR

## 2013-07-27 MED ORDER — METHOCARBAMOL 500 MG PO TABS
ORAL_TABLET | ORAL | Status: AC
  Filled 2013-07-27: qty 1

## 2013-07-27 MED ORDER — INSULIN ASPART 100 UNIT/ML ~~LOC~~ SOLN
0.0000 [IU] | Freq: Three times a day (TID) | SUBCUTANEOUS | Status: DC
  Administered 2013-07-27 – 2013-07-28 (×2): 2 [IU] via SUBCUTANEOUS
  Administered 2013-07-28: 1 [IU] via SUBCUTANEOUS

## 2013-07-27 MED ORDER — BUPIVACAINE HCL (PF) 0.5 % IJ SOLN
INTRAMUSCULAR | Status: AC
  Filled 2013-07-27: qty 10

## 2013-07-27 MED ORDER — ONDANSETRON HCL 4 MG PO TABS: 4.0000 mg | ORAL_TABLET | Freq: Three times a day (TID) | ORAL | Status: DC | PRN

## 2013-07-27 MED ORDER — CEFAZOLIN SODIUM 1-5 GM-% IV SOLN
1.0000 g | Freq: Four times a day (QID) | INTRAVENOUS | Status: AC
  Administered 2013-07-27 (×2): 1 g via INTRAVENOUS
  Filled 2013-07-27 (×2): qty 50

## 2013-07-27 MED ORDER — METHOCARBAMOL 1000 MG/10ML IJ SOLN
500.0000 mg | Freq: Four times a day (QID) | INTRAVENOUS | Status: DC | PRN
  Filled 2013-07-27: qty 5

## 2013-07-27 MED ORDER — FUROSEMIDE 20 MG PO TABS
20.0000 mg | ORAL_TABLET | Freq: Two times a day (BID) | ORAL | Status: DC
  Administered 2013-07-27 – 2013-07-29 (×3): 20 mg via ORAL
  Filled 2013-07-27 (×6): qty 1

## 2013-07-27 MED ORDER — FENTANYL CITRATE 0.05 MG/ML IJ SOLN
INTRAMUSCULAR | Status: DC | PRN
  Administered 2013-07-27: 150 ug via INTRAVENOUS
  Administered 2013-07-27 (×2): 50 ug via INTRAVENOUS

## 2013-07-27 MED ORDER — METOCLOPRAMIDE HCL 10 MG PO TABS: 5.0000 mg | ORAL_TABLET | Freq: Three times a day (TID) | ORAL | Status: DC | PRN

## 2013-07-27 MED ORDER — GLYCOPYRROLATE 0.2 MG/ML IJ SOLN
INTRAMUSCULAR | Status: AC
  Filled 2013-07-27: qty 2

## 2013-07-27 MED ORDER — OXYCODONE-ACETAMINOPHEN 5-325 MG PO TABS: 1.0000 | ORAL_TABLET | ORAL | Status: DC | PRN

## 2013-07-27 MED ORDER — NEOSTIGMINE METHYLSULFATE 10 MG/10ML IV SOLN
INTRAVENOUS | Status: DC | PRN
  Administered 2013-07-27: 3 mg via INTRAVENOUS

## 2013-07-27 MED ORDER — PHENOL 1.4 % MT LIQD: 1.0000 | OROMUCOSAL | Status: DC | PRN

## 2013-07-27 MED ORDER — PHENYLEPHRINE 40 MCG/ML (10ML) SYRINGE FOR IV PUSH (FOR BLOOD PRESSURE SUPPORT)
PREFILLED_SYRINGE | INTRAVENOUS | Status: AC
  Filled 2013-07-27: qty 10

## 2013-07-27 MED ORDER — DOCUSATE SODIUM 100 MG PO CAPS
100.0000 mg | ORAL_CAPSULE | Freq: Two times a day (BID) | ORAL | Status: DC
  Administered 2013-07-27 – 2013-07-29 (×4): 100 mg via ORAL
  Filled 2013-07-27 (×4): qty 1

## 2013-07-27 MED ORDER — BUPIVACAINE HCL (PF) 0.5 % IJ SOLN
INTRAMUSCULAR | Status: DC | PRN
  Administered 2013-07-27: 4 mL

## 2013-07-27 MED ORDER — POTASSIUM CHLORIDE IN NACL 20-0.9 MEQ/L-% IV SOLN
INTRAVENOUS | Status: DC
  Administered 2013-07-27: 16:00:00 via INTRAVENOUS
  Filled 2013-07-27 (×4): qty 1000

## 2013-07-27 SURGICAL SUPPLY — 55 items
APL SKNCLS STERI-STRIP NONHPOA (GAUZE/BANDAGES/DRESSINGS) ×2
BENZOIN TINCTURE PRP APPL 2/3 (GAUZE/BANDAGES/DRESSINGS) ×2 IMPLANT
BLADE SAW SGTL 18X1.27X75 (BLADE) ×3 IMPLANT
BLADE SAW SGTL 18X1.27X75MM (BLADE) ×1
BLADE SURG ROTATE 9660 (MISCELLANEOUS) IMPLANT
CLOSURE STERI-STRIP 1/2X4 (GAUZE/BANDAGES/DRESSINGS) ×1
CLSR STERI-STRIP ANTIMIC 1/2X4 (GAUZE/BANDAGES/DRESSINGS) ×1 IMPLANT
COVER SURGICAL LIGHT HANDLE (MISCELLANEOUS) ×4 IMPLANT
DRAPE IMP U-DRAPE 54X76 (DRAPES) ×8 IMPLANT
DRAPE INCISE IOBAN 66X45 STRL (DRAPES) ×4 IMPLANT
DRAPE ORTHO SPLIT 77X108 STRL (DRAPES) ×8
DRAPE PROXIMA HALF (DRAPES) ×4 IMPLANT
DRAPE SURG 17X23 STRL (DRAPES) ×4 IMPLANT
DRAPE SURG ORHT 6 SPLT 77X108 (DRAPES) ×4 IMPLANT
DRAPE U-SHAPE 47X51 STRL (DRAPES) ×4 IMPLANT
DRSG AQUACEL AG ADV 3.5X10 (GAUZE/BANDAGES/DRESSINGS) ×4 IMPLANT
DURAPREP 26ML APPLICATOR (WOUND CARE) ×4 IMPLANT
ELECT BLADE 4.0 EZ CLEAN MEGAD (MISCELLANEOUS)
ELECT CAUTERY BLADE 6.4 (BLADE) ×4 IMPLANT
ELECT REM PT RETURN 9FT ADLT (ELECTROSURGICAL) ×4
ELECTRODE BLDE 4.0 EZ CLN MEGD (MISCELLANEOUS) IMPLANT
ELECTRODE REM PT RTRN 9FT ADLT (ELECTROSURGICAL) ×2 IMPLANT
FACESHIELD WRAPAROUND (MASK) ×8 IMPLANT
FACESHIELD WRAPAROUND OR TEAM (MASK) ×4 IMPLANT
GLOVE BIOGEL PI IND STRL 7.0 (GLOVE) IMPLANT
GLOVE BIOGEL PI INDICATOR 7.0 (GLOVE)
GLOVE ECLIPSE 6.5 STRL STRAW (GLOVE) IMPLANT
GLOVE ORTHO TXT STRL SZ7.5 (GLOVE) ×4 IMPLANT
GOWN STRL REUS W/ TWL LRG LVL3 (GOWN DISPOSABLE) ×6 IMPLANT
GOWN STRL REUS W/ TWL XL LVL3 (GOWN DISPOSABLE) ×2 IMPLANT
GOWN STRL REUS W/TWL LRG LVL3 (GOWN DISPOSABLE) ×12
GOWN STRL REUS W/TWL XL LVL3 (GOWN DISPOSABLE) ×4
HANDPIECE INTERPULSE COAX TIP (DISPOSABLE)
HIP/CERM HD VIT E LINR LEV 1C ×2 IMPLANT
KIT BASIN OR (CUSTOM PROCEDURE TRAY) ×4 IMPLANT
KIT ROOM TURNOVER OR (KITS) ×4 IMPLANT
MANIFOLD NEPTUNE II (INSTRUMENTS) ×4 IMPLANT
NDL SAFETY ECLIPSE 18X1.5 (NEEDLE) ×2 IMPLANT
NEEDLE HYPO 18GX1.5 SHARP (NEEDLE) ×4
NS IRRIG 1000ML POUR BTL (IV SOLUTION) ×4 IMPLANT
PACK TOTAL JOINT (CUSTOM PROCEDURE TRAY) ×4 IMPLANT
PAD ARMBOARD 7.5X6 YLW CONV (MISCELLANEOUS) ×8 IMPLANT
SET HNDPC FAN SPRY TIP SCT (DISPOSABLE) IMPLANT
SUT ETHIBOND NAB CT1 #1 30IN (SUTURE) IMPLANT
SUT MNCRL AB 4-0 PS2 18 (SUTURE) ×4 IMPLANT
SUT MON AB 2-0 CT1 27 (SUTURE) ×4 IMPLANT
SUT VIC AB 0 CT1 27 (SUTURE) ×4
SUT VIC AB 0 CT1 27XBRD ANBCTR (SUTURE) ×2 IMPLANT
SUT VIC AB 1 CT1 27 (SUTURE) ×4
SUT VIC AB 1 CT1 27XBRD ANBCTR (SUTURE) ×2 IMPLANT
SYR 50ML LL SCALE MARK (SYRINGE) ×4 IMPLANT
TOWEL OR 17X24 6PK STRL BLUE (TOWEL DISPOSABLE) ×4 IMPLANT
TOWEL OR 17X26 10 PK STRL BLUE (TOWEL DISPOSABLE) ×4 IMPLANT
TRAY FOLEY CATH 14FR (SET/KITS/TRAYS/PACK) IMPLANT
WATER STERILE IRR 1000ML POUR (IV SOLUTION) ×8 IMPLANT

## 2013-07-27 NOTE — Discharge Summary (Addendum)
Patient ID: Tina Frank MRN: [id number] DOB/AGE: [birth year] 73 y.o.  Admit date: 07/27/2013 Discharge date: 07/28/2013  Admission Diagnoses:  Active Problems:   Primary localized osteoarthrosis, pelvic region and thigh   Discharge Diagnoses:  Same  Past Medical History  Diagnosis Date  . Obesity     is resolve- pt. highest weight was 180 10 yrs ago.  Marland Kitchen Hypertension   . Hyperlipidemia   . Reflux esophagitis   . Diabetes mellitus      5 yrs or more-oral meds control  . GERD (gastroesophageal reflux disease)   . Arthritis     Surgeries: Procedure(s): TOTAL HIP ARTHROPLASTY ANTERIOR APPROACH SHOULDER INJECTION on 07/27/2013   Consultants:    Discharged Condition: Improved  Hospital Course: Tina Frank is an 73 y.o. female who was admitted 07/27/2013 for operative treatment of<principal problem not specified>. Patient has severe unremitting pain that affects sleep, daily activities, and work/hobbies. After pre-op clearance the patient was taken to the operating room on 07/27/2013 and underwent  Procedure(s): TOTAL HIP ARTHROPLASTY ANTERIOR APPROACH SHOULDER INJECTION.    Patient was given perioperative antibiotics:     Anti-infectives   Start     Dose/Rate Route Frequency Ordered Stop   07/27/13 1600  ceFAZolin (ANCEF) IVPB 1 g/50 mL premix     1 g 100 mL/hr over 30 Minutes Intravenous Every 6 hours 07/27/13 1447 07/28/13 0027   07/27/13 0600  ceFAZolin (ANCEF) IVPB 2 g/50 mL premix     2 g 100 mL/hr over 30 Minutes Intravenous On call to O.R. 07/26/13 1414 07/27/13 0830       Patient was given sequential compression devices, early ambulation, and chemoprophylaxis to prevent DVT.  Patient benefited maximally from hospital stay and there were no complications.    Recent vital signs:  Patient Vitals for the past 24 hrs:  BP Temp Temp src Pulse Resp SpO2 Height Weight  07/28/13 0307 - - - - 16 96 % - -  07/28/13 0000 - - - - 14 98 % - -  07/27/13 2019 133/58 mmHg  97.2 F (36.2 C) Axillary 58 16 97 % - -  07/27/13 2000 - - - - 15 91 % - -  07/27/13 1500 - - - - - - 5\' 6"  (1.676 m) 69.4 kg (153 lb)  07/27/13 1449 143/55 mmHg 97.2 F (36.2 C) Oral 47 - 98 % - -  07/27/13 1431 107/41 mmHg - - 48 16 97 % - -  07/27/13 1400 112/50 mmHg - - 49 12 99 % - -  07/27/13 1330 113/49 mmHg - - 47 12 97 % - -  07/27/13 1300 107/49 mmHg - - 47 13 95 % - -  07/27/13 1230 107/41 mmHg - - 45 12 96 % - -  07/27/13 1215 121/46 mmHg - - 47 14 96 % - -  07/27/13 1200 106/50 mmHg - - 49 16 94 % - -  07/27/13 1145 112/40 mmHg - - 50 16 94 % - -  07/27/13 1130 109/46 mmHg - - 49 11 95 % - -  07/27/13 1100 104/73 mmHg - - 54 16 95 % - -  07/27/13 1045 106/92 mmHg - - 58 17 98 % - -  07/27/13 1043 - 97.8 F (36.6 C) - - - - - -  07/27/13 0644 133/47 mmHg 97.8 F (36.6 C) Oral 66 18 95 % - 69.4 kg (153 lb)     Recent laboratory studies: No results found for this  basename: WBC, HGB, HCT, PLT, NA, K, CL, CO2, BUN, CREATININE, GLUCOSE, PT, INR, CALCIUM, 2,  in the last 72 hours   Discharge Medications:     Medication List    STOP taking these medications       acetaminophen 500 MG tablet  Commonly known as:  TYLENOL      TAKE these medications       aspirin EC 325 MG tablet  Take 1 tablet (325 mg total) by mouth daily.     atorvastatin 10 MG tablet  Commonly known as:  LIPITOR  Take 10 mg by mouth at bedtime.     bisacodyl 5 MG EC tablet  Commonly known as:  DULCOLAX  Take 1 tablet (5 mg total) by mouth daily as needed for moderate constipation.     carvedilol 12.5 MG tablet  Commonly known as:  COREG  Take 12.5 mg by mouth 2 (two) times daily with a meal.     cholecalciferol 1000 UNITS tablet  Commonly known as:  VITAMIN D  Take 2,000 Units by mouth daily.     cloNIDine 0.1 MG tablet  Commonly known as:  CATAPRES  Take 0.1 mg by mouth daily as needed (for blood over 135).     esomeprazole 40 MG capsule  Commonly known as:  NEXIUM  Take 40 mg by  mouth daily as needed (acid reflux).     furosemide 20 MG tablet  Commonly known as:  LASIX  Take 20 mg by mouth 2 (two) times daily.     hydrALAZINE 50 MG tablet  Commonly known as:  APRESOLINE  Take 50 mg by mouth 2 (two) times daily.     ketoconazole 2 % cream  Commonly known as:  NIZORAL  Apply 1 application topically daily as needed (athlete's foot).     metFORMIN 500 MG 24 hr tablet  Commonly known as:  GLUCOPHAGE-XR  Take 500 mg by mouth daily with breakfast.     methocarbamol 500 MG tablet  Commonly known as:  ROBAXIN  Take 1 tablet (500 mg total) by mouth 4 (four) times daily.     multivitamin with minerals Tabs tablet  Take 1 tablet by mouth daily. Centrum Silver     ondansetron 4 MG tablet  Commonly known as:  ZOFRAN  Take 1 tablet (4 mg total) by mouth every 8 (eight) hours as needed for nausea or vomiting.     oxyCODONE-acetaminophen 5-325 MG per tablet  Commonly known as:  ROXICET  Take 1-2 tablets by mouth every 4 (four) hours as needed.     telmisartan 40 MG tablet  Commonly known as:  MICARDIS  Take 40 mg by mouth 2 (two) times daily.        Diagnostic Studies: Dg Hip Portable 1 View Left  07/27/2013   CLINICAL DATA:  Postop left hip replacement  EXAM: PORTABLE LEFT HIP - 1 VIEW  COMPARISON:  None.  FINDINGS: Single portable cross-table view of the left hip submitted. There is left hip prosthesis in anatomic alignment.  IMPRESSION: Left hip prosthesis in anatomic alignment.   Electronically Signed   By: Lahoma Crocker M.D.   On: 07/27/2013 11:30    Disposition: 01-Home or Self Care  Discharge Instructions   Call MD / Call 911    Complete by:  As directed   If you experience chest pain or shortness of breath, CALL 911 and be transported to the hospital emergency room.  If you develope a fever above 101  F, pus (white drainage) or increased drainage or redness at the wound, or calf pain, call your surgeon's office.     Change dressing    Complete by:  As  directed   Change the dressing daily with sterile 4 x 4 inch gauze dressing and paper tape.  You may clean the incision with alcohol prior to redressing     Constipation Prevention    Complete by:  As directed   Drink plenty of fluids.  Prune juice may be helpful.  You may use a stool softener, such as Colace (over the counter) 100 mg twice a day.  Use MiraLax (over the counter) for constipation as needed.     Diet - low sodium heart healthy    Complete by:  As directed      Discharge instructions    Complete by:  As directed   Weight bearing as tolerated.  May change dressing daily starting on Saturday.  May shower on Monday, but do not soak incision.  Take Aspirin 1 tab a day for the next 30 days to prevent blood clots.  May apply ice for up to 20 minutes at a time for pain and swelling.  Follow up appointment in our office in two weeks.     Follow the hip precautions as taught in Physical Therapy    Complete by:  As directed   Posterior total hip precautions.  Weight bearing as tolerated     Increase activity slowly as tolerated    Complete by:  As directed      TED hose    Complete by:  As directed   Use stockings (TED hose) for 2-3 weeks on both leg(s).  You may remove them at night for sleeping.           Follow-up Information   Follow up with [facility], MD. Schedule an appointment as soon as possible for a visit in 2 weeks.   Specialty:  Orthopedic Surgery   Contact information:   [place] [id number] [phone]        Signed: Jerilynn Mages. Doran Stabler 07/28/2013, 5:48 AM

## 2013-07-27 NOTE — Progress Notes (Signed)
Utilization review completed.  

## 2013-07-27 NOTE — Discharge Instructions (Signed)
Total Hip Replacement Care After Refer to this sheet in the next few weeks. These instructions provide you with information on caring for yourself after your procedure. Your caregiver also may give you specific instructions. Your treatment has been planned according to the most current medical practices, but problems sometimes occur. Call your caregiver if you have any problems or questions after your procedure. HOME CARE INSTRUCTIONS   Weight bearing as tolerated.  May change dressing daily starting on Saturday.  May shower on Monday, but do not soak incision.  Take Aspirin 1 tab a day for the next 30 days to prevent blood clots.  May apply ice for up to 20 minutes at a time for pain and swelling.  Follow up appointment in our office in two weeks.   Your caregiver will give you specific precautions for certain types of movement. Additional instructions include:  Take over-the-counter or prescription medicines for pain, discomfort, or fever only as directed by your caregiver.  Take quick showers (3 5 min) rather than bathe until your caregiver tells you that you can take baths again.  Avoid lifting until your caregiver instructs you otherwise.  Use a raised toilet seat and avoid sitting in low chairs as instructed by your caregiver.  Use crutches or a walker as instructed by your caregiver. SEEK MEDICAL CARE IF:  You have difficulty breathing.  Your wound is red, swollen, or has become increasingly painful.  You have pus draining from your wound.  You have a bad smell coming from your wound.  You have persistent bleeding from your wound.  Your wound breaks open after sutures (stitches) or staples have been removed. SEEK IMMEDIATE MEDICAL CARE IF:   You have a fever.  You have a rash.  You have pain or swelling in your calf or thigh.  You have shortness of breath or chest pain. MAKE SURE YOU:  Understand these instructions.  Will watch your condition.  Will get help right  away if you are not doing well or get worse. Document Released: 09/13/2004 Document Revised: 08/26/2011 Document Reviewed: 04/13/2011 [facility] Patient Information 2014 [place].

## 2013-07-27 NOTE — Anesthesia Postprocedure Evaluation (Signed)
  Anesthesia Post-op Note  Patient: Tina Frank  Procedure(s) Performed: Procedure(s): TOTAL HIP ARTHROPLASTY ANTERIOR APPROACH (Left) SHOULDER INJECTION (Left)  Patient Location: PACU  Anesthesia Type:General  Level of Consciousness: awake  Airway and Oxygen Therapy: Patient Spontanous Breathing  Post-op Pain: mild  Post-op Assessment: Post-op Vital signs reviewed  Post-op Vital Signs: Reviewed  Last Vitals:  Filed Vitals:   07/27/13 1045  BP: 106/92  Pulse: 58  Temp:   Resp: 17    Complications: No apparent anesthesia complications

## 2013-07-27 NOTE — Interval H&P Note (Signed)
History and Physical Interval Note:  07/27/2013 8:02 AM  Tina Frank  has presented today for surgery, with the diagnosis of djd left hip  The various methods of treatment have been discussed with the patient and family. After consideration of risks, benefits and other options for treatment, the patient has consented to  Procedure(s): TOTAL HIP ARTHROPLASTY ANTERIOR APPROACH (Left) as a surgical intervention .  The patient's history has been reviewed, patient examined, no change in status, stable for surgery.  I have reviewed the patient's chart and labs.  Questions were answered to the patient's satisfaction.     [name]

## 2013-07-27 NOTE — Transfer of Care (Signed)
Immediate Anesthesia Transfer of Care Note  Patient: Tina Frank  Procedure(s) Performed: Procedure(s): TOTAL HIP ARTHROPLASTY ANTERIOR APPROACH (Left) SHOULDER INJECTION (Left)  Patient Location: PACU  Anesthesia Type:General  Level of Consciousness: awake, alert  and oriented  Airway & Oxygen Therapy: Patient Spontanous Breathing and Patient connected to face mask oxygen  Post-op Assessment: Report given to PACU RN  Post vital signs: Reviewed and stable  Complications: No apparent anesthesia complications

## 2013-07-27 NOTE — Evaluation (Signed)
Physical Therapy Evaluation Patient Details Name: Tina Frank MRN: [id number] DOB: [birth year] Today's Date: 07/27/2013   History of Present Illness  s/p Lt anterior THA  Clinical Impression  Pt is s/p Lt THA POD#0  resulting in the deficits listed below (see PT Problem List). Pt will benefit from skilled PT to increase their independence and safety with mobility to allow discharge to the venue listed below. Evaluation limited to sitting EOB ~10 min due to dizziness and nausea. anticipate good progress once nausea subsides. Pt very independent PTA and motivated.      Follow Up Recommendations Home health PT;Supervision/Assistance - 24 hour    Equipment Recommendations  Rolling walker with 5" wheels;3in1 (PT)    Recommendations for Other Services OT consult     Precautions / Restrictions Precautions Precautions: Anterior Hip;Fall Restrictions Weight Bearing Restrictions: Yes LLE Weight Bearing: Weight bearing as tolerated      Mobility  Bed Mobility Overal bed mobility: Needs Assistance Bed Mobility: Supine to Sit;Sit to Supine     Supine to sit: Min assist;HOB elevated Sit to supine: Min assist   General bed mobility comments: (A) to advance Lt LE off/onto EOB; cues for sequencing; requried incr time due to nausea and lethargy  Transfers                 General transfer comment: unable to assess due to nausea and dizziness   Ambulation/Gait                Stairs            Wheelchair Mobility    Modified Rankin (Stroke Patients Only)       Balance Overall balance assessment: Needs assistance Sitting-balance support: Feet supported;No upper extremity supported Sitting balance-Leahy Scale: Good Sitting balance - Comments: tolerated sitting ~10 min; unable to address standing due to dizziness and nausea                                      Pertinent Vitals/Pain C/o 6/10 pain in Lt hip; patient repositioned for comfort; pt  premedicated     Home Living Family/patient expects to be discharged to:: Private residence Living Arrangements: Alone Available Help at Discharge: Family;Available 24 hours/day Type of Home: House Home Access: Stairs to enter Entrance Stairs-Rails: Left;Right;Can reach both Entrance Stairs-Number of Steps: 3-4 Home Layout: One level Home Equipment: Walker - 2 wheels Additional Comments: pt has tub shower     Prior Function Level of Independence: Independent         Comments: pt independent and drives      Hand Dominance   Dominant Hand: Right    Extremity/Trunk Assessment   Upper Extremity Assessment: Defer to OT evaluation           Lower Extremity Assessment: LLE deficits/detail   LLE Deficits / Details: quad 3+/5; hip limited by pain   Cervical / Trunk Assessment: Normal  Communication   Communication: No difficulties  Cognition Arousal/Alertness: Lethargic;Suspect due to medications Behavior During Therapy: [facility] for tasks assessed/performed Overall Cognitive Status: Within Functional Limits for tasks assessed                      General Comments      Exercises Total Joint Exercises Ankle Circles/Pumps: AROM;Both;10 reps;Supine;Strengthening Quad Sets: AROM;Left;5 reps;Supine Long Arc Quad: AROM;Strengthening;Both;5 reps;Seated      Assessment/Plan    PT  Assessment Patient needs continued PT services  PT Diagnosis Generalized weakness;Difficulty walking;Acute pain   PT Problem List Decreased strength;Decreased range of motion;Decreased activity tolerance;Decreased balance;Decreased mobility;Decreased knowledge of use of DME;Decreased knowledge of precautions;Pain  PT Treatment Interventions DME instruction;Gait training;Stair training;Functional mobility training;Therapeutic activities;Therapeutic exercise;Balance training;Neuromuscular re-education;Patient/family education   PT Goals (Current goals can be found in the Care Plan section)  Acute Rehab PT Goals Patient Stated Goal: to go home with sons PT Goal Formulation: With patient Time For Goal Achievement: 08/03/13 Potential to Achieve Goals: Good    Frequency 7X/week   Barriers to discharge        Co-evaluation               End of Session Equipment Utilized During Treatment: Oxygen (2L) Activity Tolerance: Patient limited by lethargy;Other (comment) (nausea ) Patient left: in bed;with call bell/phone within reach;with family/visitor present Nurse Communication: Mobility status;Other (comment);Weight bearing status;Precautions (nausea )         Time: 8333-8329 PT Time Calculation (min): 22 min   Charges:   PT Evaluation $Initial PT Evaluation Tier I: 1 Procedure PT Treatments $Therapeutic Activity: 8-22 mins   PT G CodesKennis Carina [place], [place]  [phone] 07/27/2013, 5:17 PM

## 2013-07-27 NOTE — Op Note (Signed)
NAMECHALLIS, CRILL NO.:  [id number]  MEDICAL RECORD NO.:  [id number]  LOCATION:  5N28C                        FACILITY:  [facility]  PHYSICIAN:  [name], M.D. DATE OF BIRTH:  [birth year]  DATE OF PROCEDURE:  07/27/2013 DATE OF DISCHARGE:                              OPERATIVE REPORT   PREOPERATIVE DIAGNOSIS:  Left hip end-stage degenerative arthritis.  POSTOPERATIVE DIAGNOSES:  Left hip end-stage degenerative arthritis. Also subacromial impingement, left shoulder.  PROCEDURE:  Left hip direct anterior total hip replacement utilizing Stryker prosthesis.  A 52-mm acetabular component screw fixation x2.  A 36-mm internal diameter liner.  A press-fit #6 Accolade stem.  A 36 -2.5 ceramic head.  Also subacromial injection, left shoulder with Depo- Medrol and Marcaine.  SURGEON:  [name], MD  ASSISTANT:  Doran Stabler PA, present throughout the entire case and necessary for timely completion of procedure.  ANESTHESIA:  General.  BLOOD LOSS:  Minimal.  SPECIMENS:  None.  CULTURES:  None.  COMPLICATIONS:  None.  DRESSING:  Soft compressive.  DESCRIPTION OF PROCEDURE:  The patient was brought to the operating room, placed on the operating table in supine position.  After adequate anesthesia had been obtained, first we injected the left shoulder subacromially under sterile technique with Depo-Medrol and Marcaine. Attention then turned to her hip.  Appropriate positioning for an anterior approach.  Prepped and draped in usual sterile fashion. Longitudinal incision from the ASIS distally.  Skin and subcutaneous tissue divided.  Fascia over the tensor identified.  This was incised. The tensor brought laterally.  Soft tissue resected.  Retractors put in place.  Anterior capsule completely excised.  Femoral head was then cut just below the head and then a napkin ring 1 fingerbreadth above the lesser trochanter.  Napkin ring and then the head  removed.  Acetabulum exposed.  This then brought up to good bleeding bone and positioning for a 52-mm cup was hammered in place at 40 degrees of abduction and 10 degrees of anteversion.  Good capturing and fixation augmented with 2 screws placed through this cup.  A 36-mm internal diameter liner inserted.  The femur was then freed up of soft tissue posteriorly to allow access.  Utilizing appropriate broaches brought up to good fitting for a #6 component.  After trials with #6 component was seated, I utilized the 36 -2.5 Bi-lock hip.  Hip was reduced.  This gave me great stability of the leg lengths.  Soft tissues injected with Exparel. Wound thoroughly irrigated.  The fascia over the tensor closed with Vicryl.  Subcutaneous and subcuticular closure of wound, margins were injected with Marcaine. Sterile compressive dressing applied.  Anesthesia reversed.  Brought to the recovery room.  Tolerated the surgery well.  No complications.     [name], M.D.     DFM/MEDQ  D:  07/27/2013  T:  07/27/2013  Job:  [id number]

## 2013-07-27 NOTE — Anesthesia Preprocedure Evaluation (Addendum)
Anesthesia Evaluation  Patient identified by MRN, date of birth, ID band Patient awake  General Assessment Comment:Reviewed chart and patient preop at 0747 this am.. CE  Reviewed: Allergy & Precautions, H&P , NPO status , Patient's Chart, lab work & pertinent test results  Airway Mallampati: II      Dental   Pulmonary former smoker,          Cardiovascular hypertension, Rhythm:Regular Rate:Normal     Neuro/Psych    GI/Hepatic GERD-  ,  Endo/Other  diabetes  Renal/GU      Musculoskeletal   Abdominal   Peds  Hematology   Anesthesia Other Findings   Reproductive/Obstetrics                      Anesthesia Physical Anesthesia Plan  ASA: III  Anesthesia Plan: General   Post-op Pain Management:    Induction: Intravenous  Airway Management Planned: Oral ETT  Additional Equipment:   Intra-op Plan:   Post-operative Plan: Extubation in OR  Informed Consent: I have reviewed the patients History and Physical, chart, labs and discussed the procedure including the risks, benefits and alternatives for the proposed anesthesia with the patient or authorized representative who has indicated his/her understanding and acceptance.   Dental advisory given  Plan Discussed with: CRNA and Anesthesiologist  Anesthesia Plan Comments:        Anesthesia Quick Evaluation

## 2013-07-28 ENCOUNTER — Encounter (HOSPITAL_COMMUNITY): Payer: Self-pay | Admitting: Orthopedic Surgery

## 2013-07-28 LAB — CBC
HEMATOCRIT: 34.1 % — AB (ref 36.0–46.0)
HEMOGLOBIN: 11.5 g/dL — AB (ref 12.0–15.0)
MCH: 29 pg (ref 26.0–34.0)
MCHC: 33.7 g/dL (ref 30.0–36.0)
MCV: 85.9 fL (ref 78.0–100.0)
Platelets: 182 10*3/uL (ref 150–400)
RBC: 3.97 MIL/uL (ref 3.87–5.11)
RDW: 13 % (ref 11.5–15.5)
WBC: 10.7 10*3/uL — AB (ref 4.0–10.5)

## 2013-07-28 LAB — BASIC METABOLIC PANEL
BUN: 11 mg/dL (ref 6–23)
CHLORIDE: 102 meq/L (ref 96–112)
CO2: 20 meq/L (ref 19–32)
Calcium: 8.5 mg/dL (ref 8.4–10.5)
Creatinine, Ser: 0.62 mg/dL (ref 0.50–1.10)
GFR calc non Af Amer: 87 mL/min — ABNORMAL LOW (ref >90)
GLUCOSE: 125 mg/dL — AB (ref 70–99)
Potassium: 3.4 mEq/L — ABNORMAL LOW (ref 3.7–5.3)
Sodium: 139 mEq/L (ref 137–147)

## 2013-07-28 LAB — GLUCOSE, CAPILLARY
GLUCOSE-CAPILLARY: 162 mg/dL — AB (ref 70–99)
GLUCOSE-CAPILLARY: 142 mg/dL — AB (ref 70–99)
Glucose-Capillary: 118 mg/dL — ABNORMAL HIGH (ref 70–99)
Glucose-Capillary: 122 mg/dL — ABNORMAL HIGH (ref 70–99)

## 2013-07-28 NOTE — Evaluation (Signed)
Occupational Therapy Evaluation Patient Details Name: Tina Frank MRN: [id number] DOB: 1940-06-07 Today's Date: 07/28/2013    History of Present Illness s/p Lt anterior THA   Clinical Impression   This 73 yo female admitted and underwent above presents to acute OT with decreased balance, decreased cognition (problem solving), decreased memory for hip precautions, increased pain all affecting pt's ability to care for herself. She will benefit from acute OT with follow up [place].   Follow Up Recommendations  Home health OT    Equipment Recommendations  Tub/shower bench       Precautions / Restrictions Precautions Precautions: Anterior Hip;Fall Precaution Comments: Patient continues to need cues for precautions.  Restrictions Weight Bearing Restrictions: No LLE Weight Bearing: Weight bearing as tolerated      Mobility Bed Mobility Overal bed mobility: Needs Assistance Bed Mobility: Supine to Sit;Sit to Supine     Supine to sit: Mod assist;HOB elevated (Pt could not problem solve how to get up without the rails) Sit to supine: Min assist   General bed mobility comments: A for BLE back into bed and cues for elbow/trunk positioning.   Transfers Overall transfer level: Needs assistance Equipment used: Rolling walker (2 wheeled)   Sit to Stand: Min guard         General transfer comment:  Cues for hand placement and technique    Balance Overall balance assessment: Needs assistance Sitting-balance support: No upper extremity supported;Feet supported Sitting balance-Leahy Scale: Good                                      ADL Overall ADL's : Needs assistance/impaired Eating/Feeding: Independent;Sitting   Grooming: Set up;Sitting   Upper Body Bathing: Set up;Sitting   Lower Body Bathing: Moderate assistance;Sit to/from stand   Upper Body Dressing : Set up;Sitting   Lower Body Dressing: Minimal assistance;Sit to/from stand;Adhering to hip  precautions;With adaptive equipment                                 Pertinent Vitals/Pain No c/o pain     Hand Dominance Right   Extremity/Trunk Assessment Upper Extremity Assessment Upper Extremity Assessment: Overall WFL for tasks assessed           Communication Communication Communication: No difficulties   Cognition Arousal/Alertness: Awake/alert Behavior During Therapy: Anxious;WFL for tasks assessed/performed Overall Cognitive Status: Within Functional Limits for tasks assessed       Memory: Decreased recall of precautions                        Home Living Family/patient expects to be discharged to:: Private residence Living Arrangements: Alone Available Help at Discharge: Family;Available 24 hours/day (her two sons) Type of Home: House Home Access: Stairs to enter [employer] of Steps: 3-4 Entrance Stairs-Rails: Left;Right;Can reach both Home Layout: One level     Bathroom Shower/Tub: Tub/shower unit;Curtain Shower/tub characteristics: Architectural technologist: Standard     Home Equipment: Environmental consultant - 2 wheels;Bedside commode   Additional Comments: pt has tub shower       Prior Functioning/Environment Level of Independence: Independent        Comments: pt independent and drives     OT Diagnosis: Generalized weakness;Cognitive deficits;Acute pain   OT Problem List: Decreased strength;Decreased range of motion;Impaired balance (sitting and/or standing);Decreased knowledge of use  of DME or AE;Decreased knowledge of precautions;Pain;Decreased cognition   OT Treatment/Interventions: Self-care/ADL training;DME and/or AE instruction;Cognitive remediation/compensation;Patient/family education;Balance training    OT Goals(Current goals can be found in the care plan section) Acute Rehab OT Goals Patient Stated Goal: to go home with sons OT Goal Formulation: With patient Time For Goal Achievement: 08/04/13 Potential to  Achieve Goals: Good  OT Frequency: Min 2X/week              End of Session    Activity Tolerance: Patient tolerated treatment well Patient left: in bed;with call bell/phone within reach;with family/visitor present   Time: 3785-8850 OT Time Calculation (min): 29 min Charges:  OT General Charges $OT Visit: 1 Procedure OT Evaluation $Initial OT Evaluation Tier I: 1 Procedure OT Treatments $Self Care/Home Management : 23-37 mins  Almon Register [phone] 07/28/2013, 4:01 PM

## 2013-07-28 NOTE — Progress Notes (Signed)
Physical Therapy Treatment Patient Details Name: CAMYLLE WHICKER MRN: [id number] DOB: 1940-03-22 Today's Date: 07/28/2013    History of Present Illness s/p Lt anterior THA    PT Comments    Patient continues to have complaints of nausea and dizziness. RN aware. Patient limited with ambulation and returned to bed. Will need to be able to complete hall ambulation and stairs prior to DC home. Patient tends to over analyze and be anxious with mobility.   Follow Up Recommendations  Home health PT;Supervision/Assistance - 24 hour     Equipment Recommendations  Rolling walker with 5" wheels;3in1 (PT)    Recommendations for Other Services       Precautions / Restrictions Precautions Precautions: Anterior Hip;Fall Precaution Comments: Patient continues to need cues for precautions.  Restrictions LLE Weight Bearing: Weight bearing as tolerated    Mobility  Bed Mobility Overal bed mobility: Needs Assistance       Supine to sit: Min assist;HOB elevated Sit to supine: Min assist   General bed mobility comments: A for BLE back into bed and cues for elbow/trunk positioning.   Transfers Overall transfer level: Needs assistance Equipment used: Rolling walker (2 wheeled) Transfers: Sit to/from Stand Sit to Stand: Min guard         General transfer comment:  Cues for hand placement and technique  Ambulation/Gait Ambulation/Gait assistance: Min assist Ambulation Distance (Feet): 10 Feet Assistive device: Rolling walker (2 wheeled) Gait Pattern/deviations: Step-to pattern;Decreased step length - right;Decreased step length - left Gait velocity: decreased   General Gait Details: Cues for gait sequence and RW positioning   Stairs            Wheelchair Mobility    Modified Rankin (Stroke Patients Only)       Balance                                    Cognition Arousal/Alertness: Awake/alert Behavior During Therapy: WFL for tasks  assessed/performed;Anxious Overall Cognitive Status: Within Functional Limits for tasks assessed                      Exercises Total Joint Exercises Quad Sets: AROM;Left;10 reps Heel Slides: AAROM;Left;10 reps Straight Leg Raises: AAROM;Left;10 reps Long Arc Quad: AROM;Left;10 reps    General Comments        Pertinent Vitals/Pain no apparent distress     Home Living                      Prior Function            PT Goals (current goals can now be found in the care plan section) Progress towards PT goals: Progressing toward goals    Frequency  7X/week    PT Plan Current plan remains appropriate    Co-evaluation             End of Session Equipment Utilized During Treatment: Gait belt Activity Tolerance: Patient tolerated treatment well Patient left: in bed;with call bell/phone within reach     Time: 1330-1356 PT Time Calculation (min): 26 min  Charges:  $Gait Training: 8-22 mins $Therapeutic Exercise: 8-22 mins                    G Codes:      Tonia Brooms Robinette 07/28/2013, 2:04 PM 07/28/2013 [place] PTA [phone] pager [phone] office

## 2013-07-28 NOTE — Progress Notes (Signed)
Rept to Eula Listen PA. Pt BP this AM 108/93 P 76. Pt is able to get up to [facility] with minimal dizziness. Pt schedule to get Lasix, Coreg, Apresoline, and Avapro. Per Lindsay's order- will hold these meds this AM. Will continue to monitor.

## 2013-07-28 NOTE — Progress Notes (Signed)
Physical Therapy Treatment Patient Details Name: Tina Frank MRN: [id number] DOB: 1940/05/15 Today's Date: 07/28/2013    History of Present Illness s/p Lt anterior THA    PT Comments    Patient continues to have some nausea and some dizziness. RN aware and patient is moving cautiously. Patient able to walk a little in the room and once nausea is under control, I believe that patient will progress very well. Will attempt to see later this afternoon as time allows.   Follow Up Recommendations        Equipment Recommendations  Rolling walker with 5" wheels;3in1 (PT)    Recommendations for Other Services       Precautions / Restrictions Precautions Precautions: Anterior Hip;Fall Precaution Comments: Reeducated patient on all precautions this session as she was unable to recall  Restrictions Weight Bearing Restrictions: Yes LLE Weight Bearing: Weight bearing as tolerated    Mobility  Bed Mobility Overal bed mobility: Needs Assistance       Supine to sit: Min assist;HOB elevated     General bed mobility comments: A for LE off the bed. Patient utilizing rails this session. Cues for positioning.   Transfers Overall transfer level: Needs assistance Equipment used: Rolling walker (2 wheeled) Transfers: Sit to/from Stand Sit to Stand: Min assist         General transfer comment: A for balance. Cues for hand placement and technique  Ambulation/Gait Ambulation/Gait assistance: Min assist Ambulation Distance (Feet): 25 Feet Assistive device: Rolling walker (2 wheeled) Gait Pattern/deviations: Step-to pattern;Decreased step length - left;Decreased step length - right Gait velocity: decreased   General Gait Details: Cues for gait sequence and RW positioning   Stairs            Wheelchair Mobility    Modified Rankin (Stroke Patients Only)       Balance                                    Cognition Arousal/Alertness: Awake/alert Behavior  During Therapy: WFL for tasks assessed/performed;Anxious Overall Cognitive Status: Within Functional Limits for tasks assessed                      Exercises Total Joint Exercises Quad Sets: AROM;Left;10 reps Heel Slides: AAROM;Left;10 reps Long Arc Quad: AROM;Left;10 reps    General Comments        Pertinent Vitals/Pain no apparent distress Just complaints of nausea and dizziness. HR 54-67 throughout. RN aware    Home Living                      Prior Function            PT Goals (current goals can now be found in the care plan section) Progress towards PT goals: Progressing toward goals    Frequency  7X/week    PT Plan Current plan remains appropriate    Co-evaluation             End of Session Equipment Utilized During Treatment: Gait belt Activity Tolerance: Patient tolerated treatment well Patient left: in chair;with call bell/phone within reach     Time: 0941-1008 PT Time Calculation (min): 27 min  Charges:  $Gait Training: 8-22 mins $Therapeutic Exercise: 8-22 mins                    G Codes:  [name] 07/28/2013, 10:19 AM  07/28/2013 [place] PTA [phone] pager [phone] office

## 2013-07-28 NOTE — Progress Notes (Signed)
Subjective: 1 Day Post-Op Procedure(s) (LRB): TOTAL HIP ARTHROPLASTY ANTERIOR APPROACH (Left) SHOULDER INJECTION (Left) Patient reports pain as mild.  Still reports pain in left shoulder, however.  Admits to some lightheadedness/dizziness last night, but none this am.  No nausea/vomiting.  No flatus and no bm as of yet.  Tolerating diet.  Objective: Vital signs in last 24 hours: Temp:  [97.2 F (36.2 C)-97.8 F (36.6 C)] 97.2 F (36.2 C) (05/20 2019) Pulse Rate:  [45-66] 58 (05/20 2019) Resp:  [11-18] 16 (05/21 0307) BP: (104-143)/(40-92) 133/58 mmHg (05/20 2019) SpO2:  [91 %-99 %] 96 % (05/21 0307) FiO2 (%):  [3 %] 3 % (05/20 2019) Weight:  [69.4 kg (153 lb)] 69.4 kg (153 lb) (05/20 1500)  Intake/Output from previous day: 05/20 0701 - 05/21 0700 In: 2190 [P.O.:340; I.V.:1850] Out: 500 [Urine:200; Blood:300] Intake/Output this shift: Total I/O In: -  Out: 200 [Urine:200]  No results found for this basename: HGB,  in the last 72 hours No results found for this basename: WBC, RBC, HCT, PLT,  in the last 72 hours No results found for this basename: NA, K, CL, CO2, BUN, CREATININE, GLUCOSE, CALCIUM,  in the last 72 hours No results found for this basename: LABPT, INR,  in the last 72 hours  Neurologically intact Neurovascular intact Sensation intact distally Intact pulses distally Dorsiflexion/Plantar flexion intact Compartment soft No calf tenderness bilaterally No drainage noted through dressing  Assessment/Plan: 1 Day Post-Op Procedure(s) (LRB): TOTAL HIP ARTHROPLASTY ANTERIOR APPROACH (Left) SHOULDER INJECTION (Left) Advance diet Up with therapy D/C IV fluids Plan for discharge tomorrow to home with home health  M. Doran Stabler 07/28/2013, 6:04 AM

## 2013-07-29 LAB — BASIC METABOLIC PANEL
BUN: 9 mg/dL (ref 6–23)
CHLORIDE: 99 meq/L (ref 96–112)
CO2: 24 mEq/L (ref 19–32)
Calcium: 8.9 mg/dL (ref 8.4–10.5)
Creatinine, Ser: 0.65 mg/dL (ref 0.50–1.10)
GFR calc non Af Amer: 85 mL/min — ABNORMAL LOW (ref >90)
Glucose, Bld: 125 mg/dL — ABNORMAL HIGH (ref 70–99)
POTASSIUM: 3.1 meq/L — AB (ref 3.7–5.3)
Sodium: 137 mEq/L (ref 137–147)

## 2013-07-29 LAB — CBC
HEMATOCRIT: 31.4 % — AB (ref 36.0–46.0)
Hemoglobin: 10.5 g/dL — ABNORMAL LOW (ref 12.0–15.0)
MCH: 28.8 pg (ref 26.0–34.0)
MCHC: 33.4 g/dL (ref 30.0–36.0)
MCV: 86.3 fL (ref 78.0–100.0)
Platelets: 180 10*3/uL (ref 150–400)
RBC: 3.64 MIL/uL — AB (ref 3.87–5.11)
RDW: 13.2 % (ref 11.5–15.5)
WBC: 9.9 10*3/uL (ref 4.0–10.5)

## 2013-07-29 LAB — GLUCOSE, CAPILLARY
Glucose-Capillary: 128 mg/dL — ABNORMAL HIGH (ref 70–99)
Glucose-Capillary: 133 mg/dL — ABNORMAL HIGH (ref 70–99)

## 2013-07-29 NOTE — Progress Notes (Signed)
Physical Therapy Treatment Patient Details Name: Tina Frank MRN: [id number] DOB: 24-May-1940 Today's Date: 07/29/2013    History of Present Illness s/p Lt anterior THA    PT Comments    Saw patient this morning in plans for DC home today. Able to complete long ambulation and stair training. Son present for education. Progressing much better than yesterday.   Follow Up Recommendations  Home health PT;Supervision/Assistance - 24 hour     Equipment Recommendations  Rolling walker with 5" wheels;3in1 (PT)    Recommendations for Other Services       Precautions / Restrictions Precautions Precautions: Anterior Hip;Fall Restrictions LLE Weight Bearing: Weight bearing as tolerated    Mobility  Bed Mobility                  Transfers Overall transfer level: Needs assistance Equipment used: Rolling walker (2 wheeled)   Sit to Stand: Supervision         General transfer comment:  Cues for hand placement and technique  Ambulation/Gait Ambulation/Gait assistance: Supervision Ambulation Distance (Feet): 200 Feet Assistive device: Rolling walker (2 wheeled) Gait Pattern/deviations: Step-to pattern     General Gait Details: Cues for upright posture and positioning inside of RW   Stairs Stairs: Yes Stairs assistance: Min guard Stair Management: Step to pattern;Forwards;Two rails Number of Stairs: 5 General stair comments: Cues for sequency and technique  Wheelchair Mobility    Modified Rankin (Stroke Patients Only)       Balance                                    Cognition Arousal/Alertness: Awake/alert Behavior During Therapy: WFL for tasks assessed/performed Overall Cognitive Status: Within Functional Limits for tasks assessed                      Exercises Total Joint Exercises Quad Sets: AROM;Left;15 reps Heel Slides: AAROM;Left;15 reps Straight Leg Raises: AAROM;Left;15 reps Long Arc Quad: AROM;Left;15 reps     General Comments        Pertinent Vitals/Pain no apparent distress     Home Living                      Prior Function            PT Goals (current goals can now be found in the care plan section) Progress towards PT goals: Progressing toward goals    Frequency  7X/week    PT Plan Current plan remains appropriate    Co-evaluation             End of Session Equipment Utilized During Treatment: Gait belt Activity Tolerance: Patient tolerated treatment well Patient left: with call bell/phone within reach;in chair;with family/visitor present     Time: 1034-1100 PT Time Calculation (min): 26 min  Charges:  $Gait Training: 8-22 mins $Therapeutic Exercise: 8-22 mins                    G Codes:      Tonia Brooms Shannie Kontos 07/29/2013, 11:06 AM  07/29/2013 [place] PTA [phone] pager [phone] office

## 2013-07-29 NOTE — Progress Notes (Signed)
Occupational Therapy Treatment Patient Details Name: ADISA VIGEANT MRN: [id number] DOB: [birth year] Today's Date: 07/29/2013    History of present illness s/p Lt anterior THA   OT comments  This 73 yo female admitted and underwent above has completed all acute OT education. She will benefit from continued OT at home.   Follow Up Recommendations  Home health OT    Equipment Recommendations  Tub/shower bench       Precautions / Restrictions Precautions Precautions: Anterior Hip;Fall Restrictions LLE Weight Bearing: Weight bearing as tolerated       Mobility Bed Mobility               General bed mobility comments: Pt up in recliner upon arrival to her room  Transfers Overall transfer level: Needs assistance Equipment used: Standard walker Transfers: Sit to/from Stand Sit to Stand: Supervision         General transfer comment:  Cues for hand placement and technique        ADL                       Lower Body Dressing: Set up;Supervision/safety;Adhering to hip precautions;Sit to/from stand;With adaptive equipment   Toilet Transfer: Supervision/safety (Recliner>tub bench>recliner)       Tub/ Banker: Min guard;Tub transfer;Ambulation;Tub bench   Functional mobility during ADLs: Min guard                  Cognition   Behavior During Therapy: WFL for tasks assessed/performed Overall Cognitive Status: Within Functional Limits for tasks assessed       Memory: Decreased recall of precautions                            Pertinent Vitals/ Pain       No c/o pain         Frequency Min 2X/week     Progress Toward Goals  OT Goals(current goals can now be found in the care plan section)  Progress towards OT goals: Progressing toward goals     Plan Discharge plan remains appropriate       End of Session Equipment Utilized During Treatment: Rolling walker   Activity Tolerance Patient tolerated treatment well    Patient Left in chair;with call bell/phone within reach;with family/visitor present           Time: 3646-8032 OT Time Calculation (min): 18 min  Charges: OT General Charges $OT Visit: 1 Procedure OT Treatments $Self Care/Home Management : 8-22 mins  Almon Register [phone] 07/29/2013, 5:05 PM

## 2013-07-29 NOTE — Care Management Note (Signed)
CARE MANAGEMENT NOTE 07/29/2013  Patient:  Tina Frank, Tina Frank   Account Number:  [id number]  Date Initiated:  07/29/2013  Documentation initiated by:  Ricki Miller  Subjective/Objective Assessment:   73 yr old female s/p left total hip arthroplasty.     Action/Plan:   patient preoperatively setup with [facility], no changes. patient has family support at discharge.Patient needs tub bench- will get from [facility] for $60.00.   Anticipated DC Date:  07/29/2013   Anticipated DC Plan:  [place] [facility]  CM consult      [place]   Choice offered to / List presented to:  C-1 Patient   DME arranged  3-N-1  [place]      DME agency  [employer] arranged  [place].   Status of service:  Completed, signed off Medicare Important Message given?   (If response is "NO", the following Medicare IM given date fields will be blank) Date Medicare IM given:   Date Additional Medicare IM given:    Discharge Disposition:  [place]

## 2013-09-02 NOTE — OR Nursing (Signed)
Addendum to scope page 

## 2013-09-22 ENCOUNTER — Other Ambulatory Visit: Payer: Self-pay

## 2013-09-22 DIAGNOSIS — Z1231 Encounter for screening mammogram for malignant neoplasm of breast: Secondary | ICD-10-CM

## 2013-09-30 ENCOUNTER — Ambulatory Visit: Payer: Medicare Other

## 2013-10-03 ENCOUNTER — Ambulatory Visit
Admission: RE | Admit: 2013-10-03 | Discharge: 2013-10-03 | Disposition: A | Discharge status: Home / Self Care | Payer: Medicare Other | Source: Ambulatory Visit

## 2013-10-03 DIAGNOSIS — Z1231 Encounter for screening mammogram for malignant neoplasm of breast: Secondary | ICD-10-CM

## 2013-10-05 ENCOUNTER — Emergency Department (HOSPITAL_COMMUNITY): Payer: Medicare Other

## 2013-10-05 ENCOUNTER — Encounter (HOSPITAL_COMMUNITY): Payer: Self-pay | Admitting: Emergency Medicine

## 2013-10-05 ENCOUNTER — Inpatient Hospital Stay (HOSPITAL_COMMUNITY)
Admission: EM | Admit: 2013-10-05 | Discharge: 2013-10-07 | DRG: 316 | Disposition: A | Discharge status: Home / Self Care | Payer: Medicare Other | Attending: Family Medicine | Admitting: Family Medicine

## 2013-10-05 DIAGNOSIS — I959 Hypotension, unspecified: Secondary | ICD-10-CM | POA: Diagnosis present

## 2013-10-05 DIAGNOSIS — E119 Type 2 diabetes mellitus without complications: Secondary | ICD-10-CM | POA: Diagnosis present

## 2013-10-05 DIAGNOSIS — I951 Orthostatic hypotension: Secondary | ICD-10-CM | POA: Diagnosis present

## 2013-10-05 DIAGNOSIS — I10 Essential (primary) hypertension: Secondary | ICD-10-CM | POA: Diagnosis present

## 2013-10-05 DIAGNOSIS — M6281 Muscle weakness (generalized): Secondary | ICD-10-CM | POA: Diagnosis present

## 2013-10-05 DIAGNOSIS — R55 Syncope and collapse: Secondary | ICD-10-CM | POA: Diagnosis present

## 2013-10-05 DIAGNOSIS — D72829 Elevated white blood cell count, unspecified: Secondary | ICD-10-CM

## 2013-10-05 DIAGNOSIS — Z96649 Presence of unspecified artificial hip joint: Secondary | ICD-10-CM

## 2013-10-05 DIAGNOSIS — E785 Hyperlipidemia, unspecified: Secondary | ICD-10-CM | POA: Diagnosis present

## 2013-10-05 DIAGNOSIS — Z7982 Long term (current) use of aspirin: Secondary | ICD-10-CM

## 2013-10-05 DIAGNOSIS — Z87891 Personal history of nicotine dependence: Secondary | ICD-10-CM

## 2013-10-05 DIAGNOSIS — K2100 Gastro-esophageal reflux disease with esophagitis, without bleeding: Secondary | ICD-10-CM | POA: Diagnosis present

## 2013-10-05 DIAGNOSIS — R61 Generalized hyperhidrosis: Secondary | ICD-10-CM | POA: Diagnosis present

## 2013-10-05 LAB — URINALYSIS, ROUTINE W REFLEX MICROSCOPIC
BILIRUBIN URINE: NEGATIVE
GLUCOSE, UA: NEGATIVE mg/dL
HGB URINE DIPSTICK: NEGATIVE
KETONES UR: NEGATIVE mg/dL
Leukocytes, UA: NEGATIVE
Nitrite: NEGATIVE
PH: 7 (ref 5.0–8.0)
PROTEIN: NEGATIVE mg/dL
Specific Gravity, Urine: 1.007 (ref 1.005–1.030)
Urobilinogen, UA: 0.2 mg/dL (ref 0.0–1.0)

## 2013-10-05 LAB — CBC
HEMATOCRIT: 38.7 % (ref 36.0–46.0)
HEMOGLOBIN: 13 g/dL (ref 12.0–15.0)
MCH: 28.5 pg (ref 26.0–34.0)
MCHC: 33.6 g/dL (ref 30.0–36.0)
MCV: 84.9 fL (ref 78.0–100.0)
Platelets: 158 10*3/uL (ref 150–400)
RBC: 4.56 MIL/uL (ref 3.87–5.11)
RDW: 14.5 % (ref 11.5–15.5)
WBC: 18.7 10*3/uL — ABNORMAL HIGH (ref 4.0–10.5)

## 2013-10-05 LAB — I-STAT TROPONIN, ED: Troponin i, poc: 0 ng/mL (ref 0.00–0.08)

## 2013-10-05 LAB — BASIC METABOLIC PANEL
Anion gap: 13 (ref 5–15)
BUN: 16 mg/dL (ref 6–23)
CO2: 24 mEq/L (ref 19–32)
CREATININE: 0.85 mg/dL (ref 0.50–1.10)
Calcium: 8.6 mg/dL (ref 8.4–10.5)
Chloride: 99 mEq/L (ref 96–112)
GFR, EST AFRICAN AMERICAN: 76 mL/min — AB (ref >90)
GFR, EST NON AFRICAN AMERICAN: 66 mL/min — AB (ref >90)
GLUCOSE: 125 mg/dL — AB (ref 70–99)
Potassium: 3.6 mEq/L — ABNORMAL LOW (ref 3.7–5.3)
Sodium: 136 mEq/L — ABNORMAL LOW (ref 137–147)

## 2013-10-05 MED ORDER — SODIUM CHLORIDE 0.9 % IV BOLUS (SEPSIS)
1000.0000 mL | Freq: Once | INTRAVENOUS | Status: AC
  Administered 2013-10-05: 1000 mL via INTRAVENOUS

## 2013-10-05 NOTE — ED Notes (Addendum)
Pt reports nausea, lightheaded and dizziness all day today that got worse at physical therapy. Pt with positive orthostatic VS. Last bp 100/60 hr 74 (pt is on beta blockers). Also pt o2 sats low and decreased more with exertion. ems placed pt on 2L [place] and administered 4mg  zofran IV and 500cc NS. Pt denies pain. cbg 140.

## 2013-10-05 NOTE — ED Notes (Signed)
Pt transported to radiology.

## 2013-10-06 ENCOUNTER — Encounter (HOSPITAL_COMMUNITY): Payer: Self-pay | Admitting: General Practice

## 2013-10-06 ENCOUNTER — Observation Stay (HOSPITAL_COMMUNITY): Payer: Medicare Other

## 2013-10-06 DIAGNOSIS — Z87891 Personal history of nicotine dependence: Secondary | ICD-10-CM | POA: Diagnosis not present

## 2013-10-06 DIAGNOSIS — I959 Hypotension, unspecified: Principal | ICD-10-CM

## 2013-10-06 DIAGNOSIS — M6281 Muscle weakness (generalized): Secondary | ICD-10-CM | POA: Diagnosis present

## 2013-10-06 DIAGNOSIS — R55 Syncope and collapse: Secondary | ICD-10-CM

## 2013-10-06 DIAGNOSIS — I369 Nonrheumatic tricuspid valve disorder, unspecified: Secondary | ICD-10-CM

## 2013-10-06 DIAGNOSIS — R61 Generalized hyperhidrosis: Secondary | ICD-10-CM | POA: Diagnosis present

## 2013-10-06 DIAGNOSIS — D72829 Elevated white blood cell count, unspecified: Secondary | ICD-10-CM

## 2013-10-06 DIAGNOSIS — I951 Orthostatic hypotension: Secondary | ICD-10-CM | POA: Diagnosis present

## 2013-10-06 DIAGNOSIS — Z7982 Long term (current) use of aspirin: Secondary | ICD-10-CM | POA: Diagnosis not present

## 2013-10-06 DIAGNOSIS — E785 Hyperlipidemia, unspecified: Secondary | ICD-10-CM

## 2013-10-06 DIAGNOSIS — E119 Type 2 diabetes mellitus without complications: Secondary | ICD-10-CM | POA: Diagnosis present

## 2013-10-06 DIAGNOSIS — I10 Essential (primary) hypertension: Secondary | ICD-10-CM | POA: Diagnosis present

## 2013-10-06 DIAGNOSIS — K2100 Gastro-esophageal reflux disease with esophagitis, without bleeding: Secondary | ICD-10-CM | POA: Diagnosis present

## 2013-10-06 DIAGNOSIS — Z96649 Presence of unspecified artificial hip joint: Secondary | ICD-10-CM | POA: Diagnosis not present

## 2013-10-06 LAB — CBC WITH DIFFERENTIAL/PLATELET
BASOS ABS: 0 10*3/uL (ref 0.0–0.1)
BASOS PCT: 0 % (ref 0–1)
EOS PCT: 0 % (ref 0–5)
Eosinophils Absolute: 0 10*3/uL (ref 0.0–0.7)
HCT: 36.3 % (ref 36.0–46.0)
Hemoglobin: 11.8 g/dL — ABNORMAL LOW (ref 12.0–15.0)
Lymphocytes Relative: 6 % — ABNORMAL LOW (ref 12–46)
Lymphs Abs: 0.7 10*3/uL (ref 0.7–4.0)
MCH: 28 pg (ref 26.0–34.0)
MCHC: 32.5 g/dL (ref 30.0–36.0)
MCV: 86.2 fL (ref 78.0–100.0)
MONO ABS: 0.8 10*3/uL (ref 0.1–1.0)
MONOS PCT: 6 % (ref 3–12)
NEUTROS ABS: 11.3 10*3/uL — AB (ref 1.7–7.7)
Neutrophils Relative %: 88 % — ABNORMAL HIGH (ref 43–77)
Platelets: 138 10*3/uL — ABNORMAL LOW (ref 150–400)
RBC: 4.21 MIL/uL (ref 3.87–5.11)
RDW: 14.7 % (ref 11.5–15.5)
WBC: 12.8 10*3/uL — ABNORMAL HIGH (ref 4.0–10.5)

## 2013-10-06 LAB — CREATININE, SERUM
CREATININE: 0.79 mg/dL (ref 0.50–1.10)
GFR, EST NON AFRICAN AMERICAN: 80 mL/min — AB (ref >90)

## 2013-10-06 LAB — COMPREHENSIVE METABOLIC PANEL
ALBUMIN: 2.6 g/dL — AB (ref 3.5–5.2)
ALK PHOS: 77 U/L (ref 39–117)
ALT: 12 U/L (ref 0–35)
ANION GAP: 14 (ref 5–15)
AST: 19 U/L (ref 0–37)
BILIRUBIN TOTAL: 0.6 mg/dL (ref 0.3–1.2)
BUN: 15 mg/dL (ref 6–23)
CHLORIDE: 100 meq/L (ref 96–112)
CO2: 21 mEq/L (ref 19–32)
Calcium: 8.1 mg/dL — ABNORMAL LOW (ref 8.4–10.5)
Creatinine, Ser: 0.87 mg/dL (ref 0.50–1.10)
GFR calc Af Amer: 74 mL/min — ABNORMAL LOW (ref >90)
GFR calc non Af Amer: 64 mL/min — ABNORMAL LOW (ref >90)
Glucose, Bld: 99 mg/dL (ref 70–99)
POTASSIUM: 3.6 meq/L — AB (ref 3.7–5.3)
SODIUM: 135 meq/L — AB (ref 137–147)
TOTAL PROTEIN: 5.4 g/dL — AB (ref 6.0–8.3)

## 2013-10-06 LAB — GLUCOSE, CAPILLARY
GLUCOSE-CAPILLARY: 115 mg/dL — AB (ref 70–99)
GLUCOSE-CAPILLARY: 111 mg/dL — AB (ref 70–99)
GLUCOSE-CAPILLARY: 114 mg/dL — AB (ref 70–99)
Glucose-Capillary: 106 mg/dL — ABNORMAL HIGH (ref 70–99)
Glucose-Capillary: 118 mg/dL — ABNORMAL HIGH (ref 70–99)
Glucose-Capillary: 177 mg/dL — ABNORMAL HIGH (ref 70–99)
Glucose-Capillary: 98 mg/dL (ref 70–99)

## 2013-10-06 LAB — CBC
HCT: 39.6 % (ref 36.0–46.0)
HEMOGLOBIN: 13.1 g/dL (ref 12.0–15.0)
MCH: 28.2 pg (ref 26.0–34.0)
MCHC: 33.1 g/dL (ref 30.0–36.0)
MCV: 85.3 fL (ref 78.0–100.0)
Platelets: 140 10*3/uL — ABNORMAL LOW (ref 150–400)
RBC: 4.64 MIL/uL (ref 3.87–5.11)
RDW: 14.5 % (ref 11.5–15.5)
WBC: 16.3 10*3/uL — ABNORMAL HIGH (ref 4.0–10.5)

## 2013-10-06 LAB — PRO B NATRIURETIC PEPTIDE: PRO B NATRI PEPTIDE: 658.4 pg/mL — AB (ref 0–125)

## 2013-10-06 LAB — D-DIMER, QUANTITATIVE (NOT AT ARMC): D DIMER QUANT: 4.94 ug{FEU}/mL — AB (ref 0.00–0.48)

## 2013-10-06 LAB — TROPONIN I
Troponin I: <0.3 ng/mL (ref <0.30)
Troponin I: <0.3 ng/mL (ref <0.30)

## 2013-10-06 LAB — MAGNESIUM: Magnesium: 1.6 mg/dL (ref 1.5–2.5)

## 2013-10-06 LAB — HEMOGLOBIN A1C
Hgb A1c MFr Bld: 6 % — ABNORMAL HIGH (ref <5.7)
Mean Plasma Glucose: 126 mg/dL — ABNORMAL HIGH (ref <117)

## 2013-10-06 LAB — TSH: TSH: 1.1 u[IU]/mL (ref 0.350–4.500)

## 2013-10-06 MED ORDER — ADULT MULTIVITAMIN W/MINERALS CH
1.0000 | ORAL_TABLET | Freq: Every day | ORAL | Status: DC
  Administered 2013-10-06 – 2013-10-07 (×2): 1 via ORAL
  Filled 2013-10-06 (×2): qty 1

## 2013-10-06 MED ORDER — POLYETHYLENE GLYCOL 3350 17 G PO PACK
17.0000 g | PACK | Freq: Every evening | ORAL | Status: DC | PRN
Start: 2013-10-06 — End: 2013-10-07
  Filled 2013-10-06: qty 1

## 2013-10-06 MED ORDER — ATORVASTATIN CALCIUM 10 MG PO TABS
10.0000 mg | ORAL_TABLET | Freq: Every day | ORAL | Status: DC
Start: 2013-10-06 — End: 2013-10-07
  Administered 2013-10-06 (×2): 10 mg via ORAL
  Filled 2013-10-06 (×3): qty 1

## 2013-10-06 MED ORDER — INSULIN ASPART 100 UNIT/ML ~~LOC~~ SOLN
0.0000 [IU] | SUBCUTANEOUS | Status: DC
  Administered 2013-10-06: 2 [IU] via SUBCUTANEOUS

## 2013-10-06 MED ORDER — ACETAMINOPHEN 325 MG PO TABS: 650.0000 mg | ORAL_TABLET | Freq: Three times a day (TID) | ORAL | Status: DC | PRN

## 2013-10-06 MED ORDER — POTASSIUM CHLORIDE IN NACL 20-0.9 MEQ/L-% IV SOLN
INTRAVENOUS | Status: DC
  Administered 2013-10-06: 02:00:00 via INTRAVENOUS
  Filled 2013-10-06 (×3): qty 1000

## 2013-10-06 MED ORDER — HEPARIN SODIUM (PORCINE) 5000 UNIT/ML IJ SOLN
5000.0000 [IU] | Freq: Three times a day (TID) | INTRAMUSCULAR | Status: DC
  Administered 2013-10-06 – 2013-10-07 (×4): 5000 [IU] via SUBCUTANEOUS
  Filled 2013-10-06 (×6): qty 1

## 2013-10-06 MED ORDER — IOHEXOL 350 MG/ML SOLN
100.0000 mL | Freq: Once | INTRAVENOUS | Status: AC | PRN
  Administered 2013-10-06: 89 mL via INTRAVENOUS

## 2013-10-06 MED ORDER — CARVEDILOL 12.5 MG PO TABS
12.5000 mg | ORAL_TABLET | Freq: Two times a day (BID) | ORAL | Status: DC
  Administered 2013-10-07: 12.5 mg via ORAL
  Filled 2013-10-06 (×5): qty 1

## 2013-10-06 MED ORDER — ASPIRIN EC 81 MG PO TBEC
81.0000 mg | DELAYED_RELEASE_TABLET | Freq: Every day | ORAL | Status: DC
  Administered 2013-10-06 – 2013-10-07 (×2): 81 mg via ORAL
  Filled 2013-10-06 (×2): qty 1

## 2013-10-06 MED ORDER — VITAMIN D3 25 MCG (1000 UNIT) PO TABS
2000.0000 [IU] | ORAL_TABLET | Freq: Every day | ORAL | Status: DC
  Administered 2013-10-06 – 2013-10-07 (×2): 2000 [IU] via ORAL
  Filled 2013-10-06 (×2): qty 2

## 2013-10-06 MED ORDER — SODIUM CHLORIDE 0.9 % IJ SOLN
3.0000 mL | Freq: Two times a day (BID) | INTRAMUSCULAR | Status: DC
  Administered 2013-10-06 – 2013-10-07 (×3): 3 mL via INTRAVENOUS

## 2013-10-06 NOTE — Progress Notes (Signed)
SLP Cancellation Note  Patient Details Name: Tina Frank MRN: [id number] DOB: 14-Mar-1940   Cancelled treatment:        Unable to complete BSE at this time, as pt is off unit for testing.  Will continue efforts.  Kaidan Harpster B. Quentin Ore [facility], CCC-SLP [phone]  [name] 10/06/2013, 10:02 AM

## 2013-10-06 NOTE — Progress Notes (Signed)
Utilization Review Completed.Donne Anon T7/30/2015

## 2013-10-06 NOTE — Progress Notes (Signed)
  Echocardiogram 2D Echocardiogram has been performed.  Landry Mellow, RDMS, RVT  10/06/2013, 2:30 PM

## 2013-10-06 NOTE — ED Provider Notes (Signed)
CSN: [id number]     Arrival date & time 10/05/13  2100 History   First MD Initiated Contact with Patient 10/05/13 2107     Chief Complaint  Patient presents with  . Dizziness     (Consider location/radiation/quality/duration/timing/severity/associated sxs/prior Treatment) Patient is a 73 y.o. female presenting with dizziness.  Dizziness Quality:  Lightheadedness Severity:  Moderate Onset quality:  Sudden Timing:  Constant Chronicity:  New Associated symptoms: no blood in stool, no chest pain, no diarrhea, no headaches, no nausea, no shortness of breath and no vomiting     Past Medical History  Diagnosis Date  . Obesity     is resolve- pt. highest weight was 180 10 yrs ago.  Marland Kitchen Hypertension   . Hyperlipidemia   . Reflux esophagitis   . Diabetes mellitus      5 yrs or more-oral meds control  . GERD (gastroesophageal reflux disease)   . Arthritis    Past Surgical History  Procedure Laterality Date  . Colonoscopy    . Childbirth      x2  . Eye surgery      laser surgery for retinal tear.  . Parathyroidectomy N/A 12/02/2012    Procedure: PARATHYROIDECTOMY with frozen section ;  Surgeon: Earnstine Regal, MD;  Location: [facility];  Service: General;  Laterality: N/A;  . Total hip arthroplasty Left 07/27/2013    Procedure: TOTAL HIP ARTHROPLASTY ANTERIOR APPROACH;  Surgeon: [name], MD;  Location: [facility];  Service: Orthopedics;  Laterality: Left;  . Shoulder injection Left 07/27/2013    Procedure: SHOULDER INJECTION;  Surgeon: [name], MD;  Location: [facility];  Service: Orthopedics;  Laterality: Left;   Family History  Problem Relation Age of Onset  . Heart failure Mother   . Cancer Mother   . Cancer Father    History  Substance Use Topics  . Smoking status: Former Smoker -- 20 years    Quit date: 10/28/1987  . Smokeless tobacco: Never Used  . Alcohol Use: No   OB History   Grav Para Term Preterm Abortions TAB SAB Ect Mult Living                 Review of  Systems  Constitutional: Negative for activity change.  HENT: Negative for congestion.   Respiratory: Negative for cough and shortness of breath.   Cardiovascular: Negative for chest pain and leg swelling.  Gastrointestinal: Negative for nausea, vomiting, abdominal pain, diarrhea, constipation, blood in stool and abdominal distention.  Genitourinary: Negative for dysuria, flank pain and vaginal discharge.  Musculoskeletal: Negative for back pain.  Skin: Negative for color change.  Neurological: Positive for dizziness. Negative for syncope and headaches.  Psychiatric/Behavioral: Negative for agitation.      Allergies  Crestor; Norvasc; Simvastatin; Zetia; and Sulfa antibiotics  Home Medications   Prior to Admission medications   Medication Sig Start Date End Date Taking? Authorizing Provider  acetaminophen (TYLENOL) 325 MG tablet Take 650 mg by mouth every 8 (eight) hours as needed.   Yes Historical Provider, MD  aspirin EC 81 MG tablet Take 81 mg by mouth daily.   Yes Historical Provider, MD  atorvastatin (LIPITOR) 10 MG tablet Take 10 mg by mouth at bedtime.    Yes Historical Provider, MD  carvedilol (COREG) 12.5 MG tablet Take 12.5 mg by mouth 2 (two) times daily with a meal.   Yes Historical Provider, MD  cholecalciferol (VITAMIN D) 1000 UNITS tablet Take 2,000 Units by mouth daily.  Yes Historical Provider, MD  cloNIDine (CATAPRES) 0.1 MG tablet Take 0.1 mg by mouth daily as needed (for blood over 135).    Yes Historical Provider, MD  furosemide (LASIX) 20 MG tablet Take 20 mg by mouth 2 (two) times daily.   Yes Historical Provider, MD  hydrALAZINE (APRESOLINE) 50 MG tablet Take 50 mg by mouth 2 (two) times daily.   Yes Historical Provider, MD  metFORMIN (GLUCOPHAGE-XR) 500 MG 24 hr tablet Take 500 mg by mouth daily with breakfast.   Yes Historical Provider, MD  Multiple Vitamin (MULTIVITAMIN WITH MINERALS) TABS Take 1 tablet by mouth daily. Centrum Silver   Yes Historical  Provider, MD  polyethylene glycol (MIRALAX / GLYCOLAX) packet Take 17 g by mouth at bedtime as needed.   Yes Historical Provider, MD  telmisartan (MICARDIS) 80 MG tablet Take 40 mg by mouth 2 (two) times daily.   Yes Historical Provider, MD   BP 97/37  Pulse 73  Temp(Src) 98.4 F (36.9 C) (Oral)  Resp 18  Ht 5\' 5"  (1.651 m)  Wt 151 lb 4.8 oz (68.629 kg)  BMI 25.18 kg/m2  SpO2 95% Physical Exam  Constitutional: She is oriented to person, place, and time. She appears well-developed.  HENT:  Head: Normocephalic.  Eyes: Pupils are equal, round, and reactive to light.  Neck: Neck supple.  Cardiovascular: Normal rate.  Exam reveals no gallop and no friction rub.   No murmur heard. Pulmonary/Chest: Effort normal and breath sounds normal. No respiratory distress.  Abdominal: Soft. She exhibits no distension. There is no tenderness. There is no rebound.  Musculoskeletal: She exhibits no edema.  Neurological: She is alert and oriented to person, place, and time.  Dixon hal pike negative  Normal gait   Skin: Skin is warm.  Psychiatric: She has a normal mood and affect.   Cranial nerves III-XII grossly intact Strength 5+/5+ to upper and lower extremities bilaterally with resistance applied, equal distribution noted Strength intact to MCP, PIP, DIP joints of  hand Negative arm drift Fine motor skills intact Heel to knee down shin normal bilaterally Gait proper, proper balance - negative sway, negative drift, negative step-offs    ED Course  Procedures (including critical care time) Labs Review Labs Reviewed  CBC - Abnormal; Notable for the following:    WBC 18.7 (*)    All other components within normal limits  BASIC METABOLIC PANEL - Abnormal; Notable for the following:    Sodium 136 (*)    Potassium 3.6 (*)    Glucose, Bld 125 (*)    GFR calc non Af Amer 66 (*)    GFR calc Af Amer 76 (*)    All other components within normal limits  CBC - Abnormal; Notable for the  following:    WBC 16.3 (*)    Platelets 140 (*)    All other components within normal limits  CREATININE, SERUM - Abnormal; Notable for the following:    GFR calc non Af Amer 80 (*)    All other components within normal limits  COMPREHENSIVE METABOLIC PANEL - Abnormal; Notable for the following:    Sodium 135 (*)    Potassium 3.6 (*)    Calcium 8.1 (*)    Total Protein 5.4 (*)    Albumin 2.6 (*)    GFR calc non Af Amer 64 (*)    GFR calc Af Amer 74 (*)    All other components within normal limits  CBC WITH DIFFERENTIAL - Abnormal; Notable for  the following:    WBC 12.8 (*)    Hemoglobin 11.8 (*)    Platelets 138 (*)    Neutrophils Relative % 88 (*)    Neutro Abs 11.3 (*)    Lymphocytes Relative 6 (*)    All other components within normal limits  PRO B NATRIURETIC PEPTIDE - Abnormal; Notable for the following:    Pro B Natriuretic peptide (BNP) 658.4 (*)    All other components within normal limits  D-DIMER, QUANTITATIVE - Abnormal; Notable for the following:    D-Dimer, Quant 4.94 (*)    All other components within normal limits  GLUCOSE, CAPILLARY - Abnormal; Notable for the following:    Glucose-Capillary 115 (*)    All other components within normal limits  GLUCOSE, CAPILLARY - Abnormal; Notable for the following:    Glucose-Capillary 106 (*)    All other components within normal limits  GLUCOSE, CAPILLARY - Abnormal; Notable for the following:    Glucose-Capillary 118 (*)    All other components within normal limits  URINE CULTURE  URINALYSIS, ROUTINE W REFLEX MICROSCOPIC  TSH  TROPONIN I  TROPONIN I  TROPONIN I  MAGNESIUM  GLUCOSE, CAPILLARY  HEMOGLOBIN A1C  I-STAT TROPOININ, ED    Imaging Review Dg Chest 2 View  10/05/2013   CLINICAL DATA:  Lightheadedness and dizziness. No chest pain or shortness of breath.  EXAM: CHEST  2 VIEW  COMPARISON:  06/28/2013  FINDINGS: The heart size and mediastinal contours are within normal limits. Both lungs are clear. The  visualized skeletal structures are unremarkable.  IMPRESSION: No active cardiopulmonary disease.   Electronically Signed   By: Lucienne Capers M.D.   On: 10/05/2013 22:27   Ct Angio Chest Pe W/cm &/or Wo Cm  10/06/2013   CLINICAL DATA:  Near syncope.  Lightheadedness.  EXAM: CT ANGIOGRAPHY CHEST WITH CONTRAST  TECHNIQUE: Multidetector CT imaging of the chest was performed using the standard protocol during bolus administration of intravenous contrast. Multiplanar CT image reconstructions and MIPs were obtained to evaluate the vascular anatomy.  CONTRAST:  14mL OMNIPAQUE IOHEXOL 350 MG/ML SOLN  COMPARISON:  No priors.  FINDINGS: Mediastinum: There are no filling defects within the pulmonary arterial tree to suggest underlying pulmonary embolism. Heart size is mildly enlarged. There is no significant pericardial fluid, thickening or pericardial calcification. There is atherosclerosis of the thoracic aorta, the great vessels of the mediastinum and the coronary arteries, including calcified atherosclerotic plaque in the left anterior descending, left circumflex and right coronary arteries. A few scattered calcified mediastinal lymph nodes are noted, largest of which is a subcarinal lymph node. No pathologically enlarged mediastinal or hilar lymph nodes. Esophagus is unremarkable in appearance.  Lungs/Pleura: Patchy ground-glass attenuation with some mild interlobular septal thickening, presumably mild interstitial pulmonary edema. No confluent consolidative airspace disease. No pleural effusions. No definite suspicious appearing pulmonary nodules or masses are noted.  Upper Abdomen: 2 low-attenuation lesions in the right kidney, previously characterized as simple cysts, largest of which measures 2.1 cm. Multiple low-attenuation left renal lesions are also noted, similar to the prior study, as well as a exophytic 1.6 cm high attenuation lesion (80 HU), which was previously characterized as a hemorrhagic cyst on prior  MRI 07/14/2012. Extensive atherosclerosis.  Musculoskeletal: Hemangioma in T9 vertebral body. There are no aggressive appearing lytic or blastic lesions noted in the visualized portions of the skeleton.  Review of the MIP images confirms the above findings.  IMPRESSION: 1. No evidence of pulmonary embolism. 2. Mild  cardiomegaly with evidence of mild interstitial pulmonary edema suggestive of mild congestive heart failure. 3. Atherosclerosis, including 3 vessel coronary artery disease. Assessment for potential risk factor modification, dietary therapy or pharmacologic therapy may be warranted, if clinically indicated. 4. Additional incidental findings, as above.   Electronically Signed   By: Vinnie Langton M.D.   On: 10/06/2013 08:30   Mr Brain Wo Contrast  10/06/2013   CLINICAL DATA:  Presyncope  EXAM: MRI HEAD WITHOUT CONTRAST  TECHNIQUE: Multiplanar, multiecho pulse sequences of the brain and surrounding structures were obtained without intravenous contrast.  COMPARISON:  MRI 07/27/2007  FINDINGS: Ventricle size is normal. Mild atrophy. Cerebral volume is normal for age.  Scattered white matter hyperintensities show mild progression since 2009 consistent with chronic microvascular ischemia. Brainstem intact.  Negative for acute infarct.  Negative for hemorrhage or mass.  Paranasal sinuses are clear. Major arteries at the base of the brain are patent.  IMPRESSION: Chronic microvascular ischemia with mild progression since 2009  No acute abnormality.   Electronically Signed   By: Franchot Gallo M.D.   On: 10/06/2013 11:17     EKG Interpretation   Date/Time:  Wednesday October 05 2013 21:02:47 EDT Ventricular Rate:  72 PR Interval:  174 QRS Duration: 84 QT Interval:  384 QTC Calculation: 420 R Axis:   27 Text Interpretation:  Normal sinus rhythm Nonspecific ST abnormality  Abnormal ECG Sinus rhythm ST-t wave abnormality Abnormal ekg Confirmed by  Carmin Muskrat  MD ([id number]) on 10/05/2013 9:09:34 PM       MDM   Final diagnoses:  Pre-syncope   73 year old female that presents with pre-syncope that is not resolved with fluids. No neuro deficit found. Patient is found to have some T wave depressions on EKG that were not present on past EKGs. Trop-neg and no chest pain. Patient admitted to hospitalist for ACS rule out and cardiology notified of the patient.     Claudean Severance, MD 10/06/13 1534

## 2013-10-06 NOTE — Progress Notes (Signed)
SLP Cancellation Note  Patient Details Name: Tina Frank MRN: [id number] DOB: 20-Jun-1940   Cancelled treatment:         SLP spoke with Dr. Coralyn Pear for clarification. Orders for BSE dc'd. No need for swallow eval. Please reconsult if needs arise.  [name] B. [name], [name]  [name] 10/06/2013, 10:27 AM

## 2013-10-06 NOTE — H&P (Addendum)
Hospitalist Admission History and Physical  Patient name: Tina Frank record number: [id number] Date of birth: 1941-01-23 Age: 73 y.o. Gender: female  Primary Care Provider: [name], MD  Chief Complaint: presyncope, leukocytosis History of Present Illness:This is a 73 y.o. year old female with significant past medical history of HTN, DM, HLD, recent L THR 07/2013   presenting with presyncope, leukocytosis. Pt states that she was at physical therapy earlier today when she developed recurrent episodes of generalized dizziness and weakness. Patient had the sensation of her passing out but never truly syncopized. Denies any chest pain or shortness of breath. Did have some mild questionable diaphoresis with this. Patient states that she went home and symptoms recurred. Did note that her blood pressure was lower than normal. Presented to the ER because of worsening symptoms. MAXIMUM TEMPERATURE 99.5, heart rate is 60 to 70s, blood pressure in the 100s to 110s. Satting in the upper 80s to mid 90s on room air. White blood cell count 18.7, hemoglobin 13, potassium 3.6, creatinine 0.5. Chest x-ray negative for any acute cardiopulmonary disease. Urinalysis negative for infection. Troponins within normal limits x1. EKG with mild T wave inversions in the lateral leads.  Assessment and Plan: Tina Frank is a 73 y.o. year old female presenting with presyncope Active Problems:   Pre-syncope   Leukocytosis   1-Presyncope -Broad differential though some concern for neurocardiogenic sources given age and risk factors. -Orthostatics -MRI, 2-D echo -T-wave inversions in lateral leads which are new. Symptoms fairly atypical. Cycle CEs. Cards c/s as indicated.  -Continue baby aspirin in the interim. No active chest pain. -Risk stratification labs -Noted recent L THR w/in past 2 months. D-dimer x1.-CTA, LE dopplers if indicative   2-Leukocytosis  -unclear etiology -no overt signs of  infection -panculture -hold abx in the interim  3-HTN -hold oral meds overnight given presentation  4-DM -SSI, A1C   FEN/GI: heart healthy carb modified diet  Prophylaxis: sub q heparin  Disposition: pending further evaluation  Code Status:Full Code    Patient Active Problem List   Diagnosis Date Noted  . Primary localized osteoarthrosis, pelvic region and thigh 07/27/2013  . Hypotension (arterial) 10/28/2011  . HTN (hypertension) with goal to be determined 10/28/2011  . DM (diabetes mellitus) 10/28/2011  . Hyperlipidemia 10/28/2011  . Obesity   . Reflux esophagitis   . Herpes zoster   . Shingles    Past Medical History: Past Medical History  Diagnosis Date  . Obesity     is resolve- pt. highest weight was 180 10 yrs ago.  Marland Kitchen Hypertension   . Hyperlipidemia   . Reflux esophagitis   . Diabetes mellitus      5 yrs or more-oral meds control  . GERD (gastroesophageal reflux disease)   . Arthritis     Past Surgical History: Past Surgical History  Procedure Laterality Date  . Colonoscopy    . Childbirth      x2  . Eye surgery      laser surgery for retinal tear.  . Parathyroidectomy N/A 12/02/2012    Procedure: PARATHYROIDECTOMY with frozen section ;  Surgeon: Earnstine Regal, MD;  Location: [facility];  Service: General;  Laterality: N/A;  . Total hip arthroplasty Left 07/27/2013    Procedure: TOTAL HIP ARTHROPLASTY ANTERIOR APPROACH;  Surgeon: [name], MD;  Location: [place] [facility];  Service: Orthopedics;  Laterality: Left;  . Shoulder injection Left 07/27/2013    Procedure: SHOULDER INJECTION;  Surgeon: [name],  MD;  Location: [place];  Service: Orthopedics;  Laterality: Left;    Social History: History   Social History  . Marital Status: Widowed    Spouse Name: N/A    Number of Children: N/A  . Years of Education: N/A   Social History Main Topics  . Smoking status: Former Smoker -- 20 years    Quit date: 10/28/1987  . Smokeless tobacco: Never Used  .  Alcohol Use: No  . Drug Use: No  . Sexual Activity: Not Currently   Other Topics Concern  . None   Social History Narrative  . None    Family History: Family History  Problem Relation Age of Onset  . Heart failure Mother   . Cancer Mother   . Cancer Father     Allergies: Allergies  Allergen Reactions  . Crestor [Rosuvastatin] Other (See Comments)    Muscle aches  . Norvasc [Amlodipine Besylate] Swelling    Ankle swelling  . Simvastatin Other (See Comments)    Muscle aches  . Zetia [Ezetimibe] Other (See Comments)    Muscle aches  . Sulfa Antibiotics Rash    Current Facility-Administered Medications  Medication Dose Route Frequency Provider Last Rate Last Dose  . 0.9 % NaCl with KCl 20 mEq/ L  infusion   Intravenous Continuous Shanda Howells, MD      . heparin injection 5,000 Units  5,000 Units Subcutaneous 3 times per day Shanda Howells, MD      . sodium chloride 0.9 % injection 3 mL  3 mL Intravenous Q12H Shanda Howells, MD       Current Outpatient Prescriptions  Medication Sig Dispense Refill  . acetaminophen (TYLENOL) 325 MG tablet Take 650 mg by mouth every 8 (eight) hours as needed.      Marland Kitchen aspirin EC 81 MG tablet Take 81 mg by mouth daily.      Marland Kitchen atorvastatin (LIPITOR) 10 MG tablet Take 10 mg by mouth at bedtime.       . carvedilol (COREG) 12.5 MG tablet Take 12.5 mg by mouth 2 (two) times daily with a meal.      . cholecalciferol (VITAMIN D) 1000 UNITS tablet Take 2,000 Units by mouth daily.      . cloNIDine (CATAPRES) 0.1 MG tablet Take 0.1 mg by mouth daily as needed (for blood over 135).       . furosemide (LASIX) 20 MG tablet Take 20 mg by mouth 2 (two) times daily.      . hydrALAZINE (APRESOLINE) 50 MG tablet Take 50 mg by mouth 2 (two) times daily.      . metFORMIN (GLUCOPHAGE-XR) 500 MG 24 hr tablet Take 500 mg by mouth daily with breakfast.      . Multiple Vitamin (MULTIVITAMIN WITH MINERALS) TABS Take 1 tablet by mouth daily. Centrum Silver      .  polyethylene glycol (MIRALAX / GLYCOLAX) packet Take 17 g by mouth at bedtime as needed.      Marland Kitchen telmisartan (MICARDIS) 80 MG tablet Take 40 mg by mouth 2 (two) times daily.       Review Of Systems: 12 point ROS negative except as noted above in HPI.  Physical Exam: Filed Vitals:   10/06/13 0015  BP: 114/31  Pulse: 68  Temp:   Resp: 17    General: alert and cooperative HEENT: PERRLA and extra ocular movement intact Heart: S1, S2 normal, no murmur, rub or gallop, regular rate and rhythm Lungs: clear to auscultation, no wheezes or  rales and unlabored breathing Abdomen: abdomen is soft without significant tenderness, masses, organomegaly or guarding Extremities: extremities normal, atraumatic, no cyanosis or edema Skin:no rashes, no ecchymoses Neurology: normal without focal findings  Labs and Imaging: Lab Results  Component Value Date/Time   NA 136* 10/05/2013  9:33 PM   K 3.6* 10/05/2013  9:33 PM   CL 99 10/05/2013  9:33 PM   CO2 24 10/05/2013  9:33 PM   BUN 16 10/05/2013  9:33 PM   CREATININE 0.85 10/05/2013  9:33 PM   GLUCOSE 125* 10/05/2013  9:33 PM   Lab Results  Component Value Date   WBC 18.7* 10/05/2013   HGB 13.0 10/05/2013   HCT 38.7 10/05/2013   MCV 84.9 10/05/2013   PLT 158 10/05/2013   Urinalysis    Component Value Date/Time   COLORURINE YELLOW 10/05/2013 2122   APPEARANCEUR CLEAR 10/05/2013 2122   LABSPEC 1.007 10/05/2013 2122   PHURINE 7.0 10/05/2013 2122   GLUCOSEU NEGATIVE 10/05/2013 2122   HGBUR NEGATIVE 10/05/2013 2122   BILIRUBINUR NEGATIVE 10/05/2013 2122   KETONESUR NEGATIVE 10/05/2013 2122   PROTEINUR NEGATIVE 10/05/2013 2122   UROBILINOGEN 0.2 10/05/2013 2122   NITRITE NEGATIVE 10/05/2013 2122   LEUKOCYTESUR NEGATIVE 10/05/2013 2122       Dg Chest 2 View  10/05/2013   CLINICAL DATA:  Lightheadedness and dizziness. No chest pain or shortness of breath.  EXAM: CHEST  2 VIEW  COMPARISON:  06/28/2013  FINDINGS: The heart size and mediastinal contours are within  normal limits. Both lungs are clear. The visualized skeletal structures are unremarkable.  IMPRESSION: No active cardiopulmonary disease.   Electronically Signed   By: Lucienne Capers M.D.   On: 10/05/2013 22:27           Shanda Howells MD  Pager: [phone]

## 2013-10-06 NOTE — Progress Notes (Signed)
*  PRELIMINARY RESULTS* Vascular Ultrasound Lower extremity venous duplex has been completed.  Preliminary findings: No evidence of DVT bilaterally. Baker's cyst noted on the left.  Landry Mellow, RDMS, RVT  10/06/2013, 1:54 PM

## 2013-10-06 NOTE — Progress Notes (Signed)
[facility] PROGRESS NOTE  Tina Frank FUX:[id number] DOB: [birth year] DOA: 10/05/2013 PCP: Tina Solo, MD  Assessment/Plan: 1. Presyncope -I suspect secondary to low blood pressures. Over the course of the issues had systolic blood pressures in the 90s. -MRI of the brain did not show evidence of acute infarct. Transthoracic echocardiogram showing ejection fraction of 65-70% without wall motion abnormalities. Troponin remaining negative x3 sets.  -Continue supportive care, monitor telemetry, anticipate discharge in the next 24 hours and she remained stable  2. Leukocytosis -Initial labs showing an elevated white count of 18,700 gradually trending down to 12,800 on a.m. lab work -Could be secondary to viral syndrome, lab workup did not reveal an obvious source of infection. CT scan of lungs do not show obvious infiltrate. Urinalysis was negative. -We'll continue to monitor off on antimicrobial therapy  3. Hypotension -As mentioned above patient have an obvious source of infection, she is nontoxic, having nonfocal exam, remained afebrile, with white count trended down while being off of antimicrobial therapy -Could be related to antihypertensive agents as she has been on Coreg twice a day. -Will monitor overnight as she remains hypotensive with systolic blood pressures in the 90's   Code Status: Full code Family Communication:  Disposition Plan: Anticipate discharge in the next 24 hours     HPI/Subjective: Patient is a pleasant 73 year old female with a past medical history of hypertension, type 2 diabetes mellitus, admitted to the medicine service on 10/06/2013 presenting with complaints of presyncope. She complained of associated generalized weakness and dizziness. She was admitted to telemetry where she underwent an extensive workup that included on MRI of the brain that did not show evidence of acute infarct. Transthoracic echocardiogram showed a normal LVEF, troponins  remaining negative. Patient was found to be hypotensive having systolic blood pressures in the 90s for which her Coreg was held.  Objective: Filed Vitals:   10/06/13 0831  BP: 97/37  Pulse: 73  Temp:   Resp:     Intake/Output Summary (Last 24 hours) at 10/06/13 1736 Last data filed at 10/06/13 1701  Gross per 24 hour  Intake    480 ml  Output   1450 ml  Net   -970 ml   Filed Weights   10/06/13 0122  Weight: 68.629 kg (151 lb 4.8 oz)    Exam:   General:  Patient is in no acute distress, nontoxic appearing, alert oriented x3  Cardiovascular: Regular rate rhythm  Respiratory: Clear to auscultation bilaterally  Abdomen: Soft nontender nondistended  Musculoskeletal: No edema  Data Reviewed: Basic Metabolic Panel:  Recent Labs Lab 10/05/13 2133 10/06/13 0041 10/06/13 0602  NA 136*  --  135*  K 3.6*  --  3.6*  CL 99  --  100  CO2 24  --  21  GLUCOSE 125*  --  99  BUN 16  --  15  CREATININE 0.85 0.79 0.87  CALCIUM 8.6  --  8.1*  MG  --  1.6  --    Liver Function Tests:  Recent Labs Lab 10/06/13 0602  AST 19  ALT 12  ALKPHOS 77  BILITOT 0.6  PROT 5.4*  ALBUMIN 2.6*   No results found for this basename: LIPASE, AMYLASE,  in the last 168 hours No results found for this basename: AMMONIA,  in the last 168 hours CBC:  Recent Labs Lab 10/05/13 2133 10/06/13 0041 10/06/13 0602  WBC 18.7* 16.3* 12.8*  NEUTROABS  --   --  11.3*  HGB 13.0 13.1  11.8*  HCT 38.7 39.6 36.3  MCV 84.9 85.3 86.2  PLT 158 140* 138*   Cardiac Enzymes:  Recent Labs Lab 10/06/13 0041 10/06/13 0602 10/06/13 1148  TROPONINI <0.30 <0.30 <0.30   BNP (last 3 results)  Recent Labs  10/06/13 0041  PROBNP 658.4*   CBG:  Recent Labs Lab 10/06/13 0222 10/06/13 0552 10/06/13 0828 10/06/13 1240 10/06/13 1657  GLUCAP 98 115* 106* 118* 111*    No results found for this or any previous visit (from the past 240 hour(s)).   Studies: Dg Chest 2 View  10/05/2013    CLINICAL DATA:  Lightheadedness and dizziness. No chest pain or shortness of breath.  EXAM: CHEST  2 VIEW  COMPARISON:  06/28/2013  FINDINGS: The heart size and mediastinal contours are within normal limits. Both lungs are clear. The visualized skeletal structures are unremarkable.  IMPRESSION: No active cardiopulmonary disease.   Electronically Signed   By: Lucienne Capers M.D.   On: 10/05/2013 22:27   Ct Angio Chest Pe W/cm &/or Wo Cm  10/06/2013   CLINICAL DATA:  Near syncope.  Lightheadedness.  EXAM: CT ANGIOGRAPHY CHEST WITH CONTRAST  TECHNIQUE: Multidetector CT imaging of the chest was performed using the standard protocol during bolus administration of intravenous contrast. Multiplanar CT image reconstructions and MIPs were obtained to evaluate the vascular anatomy.  CONTRAST:  20mL OMNIPAQUE IOHEXOL 350 MG/ML SOLN  COMPARISON:  No priors.  FINDINGS: Mediastinum: There are no filling defects within the pulmonary arterial tree to suggest underlying pulmonary embolism. Heart size is mildly enlarged. There is no significant pericardial fluid, thickening or pericardial calcification. There is atherosclerosis of the thoracic aorta, the great vessels of the mediastinum and the coronary arteries, including calcified atherosclerotic plaque in the left anterior descending, left circumflex and right coronary arteries. A few scattered calcified mediastinal lymph nodes are noted, largest of which is a subcarinal lymph node. No pathologically enlarged mediastinal or hilar lymph nodes. Esophagus is unremarkable in appearance.  Lungs/Pleura: Patchy ground-glass attenuation with some mild interlobular septal thickening, presumably mild interstitial pulmonary edema. No confluent consolidative airspace disease. No pleural effusions. No definite suspicious appearing pulmonary nodules or masses are noted.  Upper Abdomen: 2 low-attenuation lesions in the right kidney, previously characterized as simple cysts, largest of which  measures 2.1 cm. Multiple low-attenuation left renal lesions are also noted, similar to the prior study, as well as a exophytic 1.6 cm high attenuation lesion (80 HU), which was previously characterized as a hemorrhagic cyst on prior MRI 07/14/2012. Extensive atherosclerosis.  Musculoskeletal: Hemangioma in T9 vertebral body. There are no aggressive appearing lytic or blastic lesions noted in the visualized portions of the skeleton.  Review of the MIP images confirms the above findings.  IMPRESSION: 1. No evidence of pulmonary embolism. 2. Mild cardiomegaly with evidence of mild interstitial pulmonary edema suggestive of mild congestive heart failure. 3. Atherosclerosis, including 3 vessel coronary artery disease. Assessment for potential risk factor modification, dietary therapy or pharmacologic therapy may be warranted, if clinically indicated. 4. Additional incidental findings, as above.   Electronically Signed   By: Vinnie Langton M.D.   On: 10/06/2013 08:30   Mr Brain Wo Contrast  10/06/2013   CLINICAL DATA:  Presyncope  EXAM: MRI HEAD WITHOUT CONTRAST  TECHNIQUE: Multiplanar, multiecho pulse sequences of the brain and surrounding structures were obtained without intravenous contrast.  COMPARISON:  MRI 07/27/2007  FINDINGS: Ventricle size is normal. Mild atrophy. Cerebral volume is normal for age.  Scattered white matter  hyperintensities show mild progression since 2009 consistent with chronic microvascular ischemia. Brainstem intact.  Negative for acute infarct.  Negative for hemorrhage or mass.  Paranasal sinuses are clear. Major arteries at the base of the brain are patent.  IMPRESSION: Chronic microvascular ischemia with mild progression since 2009  No acute abnormality.   Electronically Signed   By: Franchot Gallo M.D.   On: 10/06/2013 11:17    Scheduled Meds: . aspirin EC  81 mg Oral Daily  . atorvastatin  10 mg Oral QHS  . carvedilol  12.5 mg Oral BID WC  . cholecalciferol  2,000 Units Oral  Daily  . heparin  5,000 Units Subcutaneous 3 times per day  . insulin aspart  0-9 Units Subcutaneous 6 times per day  . multivitamin with minerals  1 tablet Oral Daily  . sodium chloride  3 mL Intravenous Q12H   Continuous Infusions:   Active Problems:   Pre-syncope   Leukocytosis   Hypotension    Time spent:     Kelvin Cellar  [facility] Pager [phone] 7PM-7AM, please contact night-coverage at [email or web address], password [facility] 10/06/2013, 5:36 PM  LOS: 1 day

## 2013-10-07 DIAGNOSIS — E785 Hyperlipidemia, unspecified: Secondary | ICD-10-CM

## 2013-10-07 LAB — COMPREHENSIVE METABOLIC PANEL
ALT: 16 U/L (ref 0–35)
ANION GAP: 13 (ref 5–15)
AST: 25 U/L (ref 0–37)
Albumin: 2.3 g/dL — ABNORMAL LOW (ref 3.5–5.2)
Alkaline Phosphatase: 72 U/L (ref 39–117)
BILIRUBIN TOTAL: 0.3 mg/dL (ref 0.3–1.2)
BUN: 10 mg/dL (ref 6–23)
CO2: 21 mEq/L (ref 19–32)
CREATININE: 0.68 mg/dL (ref 0.50–1.10)
Calcium: 8.2 mg/dL — ABNORMAL LOW (ref 8.4–10.5)
Chloride: 100 mEq/L (ref 96–112)
GFR calc non Af Amer: 84 mL/min — ABNORMAL LOW (ref >90)
GLUCOSE: 107 mg/dL — AB (ref 70–99)
POTASSIUM: 3.6 meq/L — AB (ref 3.7–5.3)
Sodium: 134 mEq/L — ABNORMAL LOW (ref 137–147)
TOTAL PROTEIN: 5.3 g/dL — AB (ref 6.0–8.3)

## 2013-10-07 LAB — CBC WITH DIFFERENTIAL/PLATELET
BASOS ABS: 0 10*3/uL (ref 0.0–0.1)
BASOS PCT: 0 % (ref 0–1)
Eosinophils Absolute: 0.1 10*3/uL (ref 0.0–0.7)
Eosinophils Relative: 2 % (ref 0–5)
HCT: 34.3 % — ABNORMAL LOW (ref 36.0–46.0)
Hemoglobin: 11.3 g/dL — ABNORMAL LOW (ref 12.0–15.0)
Lymphocytes Relative: 10 % — ABNORMAL LOW (ref 12–46)
Lymphs Abs: 0.7 10*3/uL (ref 0.7–4.0)
MCH: 28.2 pg (ref 26.0–34.0)
MCHC: 32.9 g/dL (ref 30.0–36.0)
MCV: 85.5 fL (ref 78.0–100.0)
Monocytes Absolute: 0.8 10*3/uL (ref 0.1–1.0)
Monocytes Relative: 11 % (ref 3–12)
NEUTROS ABS: 5.7 10*3/uL (ref 1.7–7.7)
NEUTROS PCT: 77 % (ref 43–77)
PLATELETS: 120 10*3/uL — AB (ref 150–400)
RBC: 4.01 MIL/uL (ref 3.87–5.11)
RDW: 14.5 % (ref 11.5–15.5)
WBC: 7.3 10*3/uL (ref 4.0–10.5)

## 2013-10-07 LAB — URINE CULTURE
Colony Count: NO GROWTH
Culture: NO GROWTH

## 2013-10-07 LAB — GLUCOSE, CAPILLARY
GLUCOSE-CAPILLARY: 99 mg/dL (ref 70–99)
Glucose-Capillary: 110 mg/dL — ABNORMAL HIGH (ref 70–99)
Glucose-Capillary: 97 mg/dL (ref 70–99)

## 2013-10-07 MED ORDER — FUROSEMIDE 20 MG PO TABS: 20.0000 mg | ORAL_TABLET | Freq: Every day | ORAL | Status: DC

## 2013-10-07 MED ORDER — TELMISARTAN 80 MG PO TABS: 40.0000 mg | ORAL_TABLET | Freq: Every day | ORAL | Status: DC

## 2013-10-07 NOTE — Discharge Summary (Signed)
Physician Discharge Summary  Tina Frank XLK:[id number] DOB: 10/31/1940 DOA: 10/05/2013  PCP: [name], MD  Admit date: 10/05/2013 Discharge date: 10/07/2013  Time spent: 35 minutes  Recommendations for Outpatient Follow-up:  1. Please follow up on patient's blood pressures, she was on multiple anti-hypertensive agents, presented with presyncope, found to by hypotensive with SBP's in the 80's -90's. Her Hydralazine and Coreg were discontinued on discharge and Lasix decreased to 20 mg daily and Micardis decreased to 40 mg PO daily.    Discharge Diagnoses:  Active Problems:   Pre-syncope   Leukocytosis   Hypotension   Discharge Condition: Stable  Diet recommendation: Heart Healthy  Filed Weights   10/06/13 0122 10/07/13 0532  Weight: 68.629 kg (151 lb 4.8 oz) 69.718 kg (153 lb 11.2 oz)    History of present illness:  This is a 73 y.o. year old female with significant past medical history of HTN, DM, HLD, recent L THR 07/2013 presenting with presyncope, leukocytosis. Pt states that she was at physical therapy earlier today when she developed recurrent episodes of generalized dizziness and weakness. Patient had the sensation of her passing out but never truly syncopized. Denies any chest pain or shortness of breath. Did have some mild questionable diaphoresis with this. Patient states that she went home and symptoms recurred. Did note that her blood pressure was lower than normal.  Presented to the ER because of worsening symptoms. MAXIMUM TEMPERATURE 99.5, heart rate is 60 to 70s, blood pressure in the 100s to 110s. Satting in the upper 80s to mid 90s on room air. White blood cell count 18.7, hemoglobin 13, potassium 3.6, creatinine 0.5. Chest x-ray negative for any acute cardiopulmonary disease. Urinalysis negative for infection. Troponins within normal limits x1. EKG with mild T wave inversions in the lateral leads.   Hospital Course:  Patient is a pleasant 73 year old female  with a past medical history of hypertension, type 2 diabetes mellitus, admitted to the medicine service on 10/06/2013 presenting with complaints of presyncope. She complained of associated generalized weakness and dizziness. She was admitted to telemetry where she underwent an extensive workup that included on MRI of the brain that did not show evidence of acute infarct. Transthoracic echocardiogram showed a normal LVEF, troponins remaining negative. Patient was found to be hypotensive having systolic blood pressures in the 90s. She had been on multiple antihypertensive agents which were held during this hospitalization.  1. Presyncope -I suspect secondary to low blood pressures. Over the course of the issues had systolic blood pressures in the 90s.  -MRI of the brain did not show evidence of acute infarct. Transthoracic echocardiogram showing ejection fraction of 65-70% without wall motion abnormalities. Troponin remaining negative x3 sets.  -Blood pressures improving with holding anti-hypertensive agents.  -Coreg and Hydralazine discontinued on discharge, Micardis decreased to 40 mg PO q daily and Lasix decreased to 20 mg PO q daily, will need close outpatient follow up of blood pressures.   2. Leukocytosis  -Initial labs showing an elevated white count of 18,700 gradually trending down to 12,800 on a.m. lab work  -Could be secondary to viral syndrome, lab workup did not reveal an obvious source of infection. CT scan of lungs do not show obvious infiltrate. Urinalysis was negative.  -White count normalizing  3. Hypotension  -As mentioned above patient have an obvious source of infection, she is nontoxic, having nonfocal exam, remained afebrile, with white count trended down while being off of antimicrobial therapy    Procedures:  2D-Echo  Impression:  Left ventricle: The cavity size was normal. Systolic function was vigorous. The estimated ejection fraction was in the range of 65% to 70%. Wall  motion was normal; there were no regional wall motion abnormalities. Left ventricular diastolic function parameters were normal.   Discharge Exam: Filed Vitals:   10/07/13 0827  BP: 116/68  Pulse: 60  Temp: 98 F (36.7 C)  Resp: 18    General: Patient is in no acute distress, nontoxic appearing, alert oriented x3  Cardiovascular: Regular rate rhythm  Respiratory: Clear to auscultation bilaterally  Abdomen: Soft nontender nondistended  Musculoskeletal: No edema   Discharge Instructions You were cared for by a hospitalist during your hospital stay. If you have any questions about your discharge medications or the care you received while you were in the hospital after you are discharged, you can call the unit and asked to speak with the hospitalist on call if the hospitalist that took care of you is not available. Once you are discharged, your primary care physician will handle any further medical issues. Please note that NO REFILLS for any discharge medications will be authorized once you are discharged, as it is imperative that you return to your primary care physician (or establish a relationship with a primary care physician if you do not have one) for your aftercare needs so that they can reassess your need for medications and monitor your lab values.  Discharge Instructions   Call MD for:  difficulty breathing, headache or visual disturbances    Complete by:  As directed      Call MD for:  extreme fatigue    Complete by:  As directed      Call MD for:  persistant dizziness or light-headedness    Complete by:  As directed      Call MD for:  persistant nausea and vomiting    Complete by:  As directed      Call MD for:  temperature >100.4    Complete by:  As directed      Diet - low sodium heart healthy    Complete by:  As directed      Discharge instructions    Complete by:  As directed   Please follow up with your Family Physician in 1 week to have you blood pressures checked      Increase activity slowly    Complete by:  As directed             Medication List    STOP taking these medications       carvedilol 12.5 MG tablet  Commonly known as:  COREG     cloNIDine 0.1 MG tablet  Commonly known as:  CATAPRES     hydrALAZINE 50 MG tablet  Commonly known as:  APRESOLINE      TAKE these medications       acetaminophen 325 MG tablet  Commonly known as:  TYLENOL  Take 650 mg by mouth every 8 (eight) hours as needed.     aspirin EC 81 MG tablet  Take 81 mg by mouth daily.     atorvastatin 10 MG tablet  Commonly known as:  LIPITOR  Take 10 mg by mouth at bedtime.     cholecalciferol 1000 UNITS tablet  Commonly known as:  VITAMIN D  Take 2,000 Units by mouth daily.     furosemide 20 MG tablet  Commonly known as:  LASIX  Take 1 tablet (20 mg total) by mouth daily.  metFORMIN 500 MG 24 hr tablet  Commonly known as:  GLUCOPHAGE-XR  Take 500 mg by mouth daily with breakfast.     multivitamin with minerals Tabs tablet  Take 1 tablet by mouth daily. Centrum Silver     polyethylene glycol packet  Commonly known as:  MIRALAX / GLYCOLAX  Take 17 g by mouth at bedtime as needed.     telmisartan 80 MG tablet  Commonly known as:  MICARDIS  Take 0.5 tablets (40 mg total) by mouth daily.       Allergies  Allergen Reactions  . Crestor [Rosuvastatin] Other (See Comments)    Muscle aches  . Norvasc [Amlodipine Besylate] Swelling    Ankle swelling  . Simvastatin Other (See Comments)    Muscle aches  . Zetia [Ezetimibe] Other (See Comments)    Muscle aches  . Sulfa Antibiotics Rash       Follow-up Information   Follow up with [name], MD In 2 weeks.   Specialty:  Family Medicine   Contact information:   [place] [address] [phone]        The results of significant diagnostics from this hospitalization (including imaging, microbiology, ancillary and laboratory) are listed below for reference.     Significant Diagnostic Studies: Dg Chest 2 View  10/05/2013   CLINICAL DATA:  Lightheadedness and dizziness. No chest pain or shortness of breath.  EXAM: CHEST  2 VIEW  COMPARISON:  06/28/2013  FINDINGS: The heart size and mediastinal contours are within normal limits. Both lungs are clear. The visualized skeletal structures are unremarkable.  IMPRESSION: No active cardiopulmonary disease.   Electronically Signed   By: Lucienne Capers M.D.   On: 10/05/2013 22:27   Ct Angio Chest Pe W/cm &/or Wo Cm  10/06/2013   CLINICAL DATA:  Near syncope.  Lightheadedness.  EXAM: CT ANGIOGRAPHY CHEST WITH CONTRAST  TECHNIQUE: Multidetector CT imaging of the chest was performed using the standard protocol during bolus administration of intravenous contrast. Multiplanar CT image reconstructions and MIPs were obtained to evaluate the vascular anatomy.  CONTRAST:  69mL OMNIPAQUE IOHEXOL 350 MG/ML SOLN  COMPARISON:  No priors.  FINDINGS: Mediastinum: There are no filling defects within the pulmonary arterial tree to suggest underlying pulmonary embolism. Heart size is mildly enlarged. There is no significant pericardial fluid, thickening or pericardial calcification. There is atherosclerosis of the thoracic aorta, the great vessels of the mediastinum and the coronary arteries, including calcified atherosclerotic plaque in the left anterior descending, left circumflex and right coronary arteries. A few scattered calcified mediastinal lymph nodes are noted, largest of which is a subcarinal lymph node. No pathologically enlarged mediastinal or hilar lymph nodes. Esophagus is unremarkable in appearance.  Lungs/Pleura: Patchy ground-glass attenuation with some mild interlobular septal thickening, presumably mild interstitial pulmonary edema. No confluent consolidative airspace disease. No pleural effusions. No definite suspicious appearing pulmonary nodules or masses are noted.  Upper Abdomen: 2 low-attenuation lesions in the  right kidney, previously characterized as simple cysts, largest of which measures 2.1 cm. Multiple low-attenuation left renal lesions are also noted, similar to the prior study, as well as a exophytic 1.6 cm high attenuation lesion (80 HU), which was previously characterized as a hemorrhagic cyst on prior MRI 07/14/2012. Extensive atherosclerosis.  Musculoskeletal: Hemangioma in T9 vertebral body. There are no aggressive appearing lytic or blastic lesions noted in the visualized portions of the skeleton.  Review of the MIP images confirms the above findings.  IMPRESSION: 1. No evidence  of pulmonary embolism. 2. Mild cardiomegaly with evidence of mild interstitial pulmonary edema suggestive of mild congestive heart failure. 3. Atherosclerosis, including 3 vessel coronary artery disease. Assessment for potential risk factor modification, dietary therapy or pharmacologic therapy may be warranted, if clinically indicated. 4. Additional incidental findings, as above.   Electronically Signed   By: Vinnie Langton M.D.   On: 10/06/2013 08:30   Mr Brain Wo Contrast  10/06/2013   CLINICAL DATA:  Presyncope  EXAM: MRI HEAD WITHOUT CONTRAST  TECHNIQUE: Multiplanar, multiecho pulse sequences of the brain and surrounding structures were obtained without intravenous contrast.  COMPARISON:  MRI 07/27/2007  FINDINGS: Ventricle size is normal. Mild atrophy. Cerebral volume is normal for age.  Scattered white matter hyperintensities show mild progression since 2009 consistent with chronic microvascular ischemia. Brainstem intact.  Negative for acute infarct.  Negative for hemorrhage or mass.  Paranasal sinuses are clear. Major arteries at the base of the brain are patent.  IMPRESSION: Chronic microvascular ischemia with mild progression since 2009  No acute abnormality.   Electronically Signed   By: Franchot Gallo M.D.   On: 10/06/2013 11:17   Mm Screening Breast Tomo Bilateral  10/04/2013   CLINICAL DATA:  Screening.  EXAM:  DIGITAL SCREENING BILATERAL MAMMOGRAM WITH 3D TOMO WITH CAD  COMPARISON:  Previous exam(s).  ACR Breast Density Category b: There are scattered areas of fibroglandular density.  FINDINGS: There are no findings suspicious for malignancy. Images were processed with CAD.  IMPRESSION: No mammographic evidence of malignancy. A result letter of this screening mammogram will be mailed directly to the patient.  RECOMMENDATION: Screening mammogram in one year. (Code:SM-B-01Y)  BI-RADS CATEGORY  1: Negative.   Electronically Signed   By: Shon Hale M.D.   On: 10/04/2013 15:05    Microbiology: Recent Results (from the past 240 hour(s))  URINE CULTURE     Status: None   Collection Time    10/05/13  9:22 PM      Result Value Ref Range Status   Specimen Description URINE, RANDOM   Final   Special Requests NONE   Final   Culture  Setup Time     Final   Value: 10/06/2013 01:25     Performed at [facility]     Final   Value: NO GROWTH     Performed at [facility]   Culture     Final   Value: NO GROWTH     Performed at [facility]   Report Status 10/07/2013 FINAL   Final     Labs: Basic Metabolic Panel:  Recent Labs Lab 10/05/13 2133 10/06/13 0041 10/06/13 0602 10/07/13 0349  NA 136*  --  135* 134*  K 3.6*  --  3.6* 3.6*  CL 99  --  100 100  CO2 24  --  21 21  GLUCOSE 125*  --  99 107*  BUN 16  --  15 10  CREATININE 0.85 0.79 0.87 0.68  CALCIUM 8.6  --  8.1* 8.2*  MG  --  1.6  --   --    Liver Function Tests:  Recent Labs Lab 10/06/13 0602 10/07/13 0349  AST 19 25  ALT 12 16  ALKPHOS 77 72  BILITOT 0.6 0.3  PROT 5.4* 5.3*  ALBUMIN 2.6* 2.3*   No results found for this basename: LIPASE, AMYLASE,  in the last 168 hours No results found for this basename: AMMONIA,  in the last  168 hours CBC:  Recent Labs Lab 10/05/13 2133 10/06/13 0041 10/06/13 0602 10/07/13 0349  WBC 18.7* 16.3* 12.8* 7.3  NEUTROABS  --   --  11.3* 5.7  HGB  13.0 13.1 11.8* 11.3*  HCT 38.7 39.6 36.3 34.3*  MCV 84.9 85.3 86.2 85.5  PLT 158 140* 138* 120*   Cardiac Enzymes:  Recent Labs Lab 10/06/13 0041 10/06/13 0602 10/06/13 1148  TROPONINI <0.30 <0.30 <0.30   BNP: BNP (last 3 results)  Recent Labs  10/06/13 0041  PROBNP 658.4*   CBG:  Recent Labs Lab 10/06/13 2148 10/06/13 2355 10/07/13 0131 10/07/13 0523 10/07/13 0757  GLUCAP 114* 177* 99 110* 97       Signed:  Graham Doukas  [facility] 10/07/2013, 10:46 AM

## 2013-10-07 NOTE — Progress Notes (Signed)
Discussed discharge instructions with pt including how and when to call the dr, follow up appt, medications to take at home, activity, and diet. Pt verbalized understanding and denied any questions.  Eulis Canner, RN

## 2013-10-08 NOTE — ED Provider Notes (Signed)
This patient was seen in conjunction with the resident physician. The documentation accurately reflects the patient's encounter in the emergency department. On my exam, this patient was in no distress, though she continued to complain of light headedness with exertion, which is new for her. You missed an exertional lightheadedness, ST segment changes on EKG, patient required admission for further evaluation and management.  EKG showed sinus rhythm, rate 72 with changes in the ST segments that was new since 2013, abnormal   Carmin Muskrat, MD 10/08/13 [phone]

## 2014-05-16 ENCOUNTER — Other Ambulatory Visit: Payer: Self-pay | Admitting: Orthopedic Surgery

## 2014-05-16 DIAGNOSIS — M25512 Pain in left shoulder: Secondary | ICD-10-CM

## 2014-06-03 ENCOUNTER — Ambulatory Visit
Admission: RE | Admit: 2014-06-03 | Discharge: 2014-06-03 | Disposition: A | Discharge status: Home / Self Care | Payer: Medicare Other | Source: Ambulatory Visit | Attending: Orthopedic Surgery | Admitting: Orthopedic Surgery

## 2014-06-03 DIAGNOSIS — M25512 Pain in left shoulder: Secondary | ICD-10-CM

## 2014-10-05 ENCOUNTER — Other Ambulatory Visit: Payer: Self-pay

## 2014-10-05 DIAGNOSIS — Z1231 Encounter for screening mammogram for malignant neoplasm of breast: Secondary | ICD-10-CM

## 2014-10-19 ENCOUNTER — Ambulatory Visit (INDEPENDENT_AMBULATORY_CARE_PROVIDER_SITE_OTHER): Payer: Medicare Other | Admitting: Podiatry

## 2014-10-19 ENCOUNTER — Encounter: Payer: Self-pay | Admitting: Podiatry

## 2014-10-19 VITALS — BP 165/85 | HR 67 | Temp 98.5°F | Resp 12

## 2014-10-19 DIAGNOSIS — B351 Tinea unguium: Secondary | ICD-10-CM

## 2014-10-19 DIAGNOSIS — M79676 Pain in unspecified toe(s): Secondary | ICD-10-CM

## 2014-10-19 NOTE — Progress Notes (Signed)
   Subjective:    Patient ID: Tina Frank, female    DOB: 1940-04-07, 74 y.o.   MRN: [id number]  HPI  Patient presents here today for B/L toenail trim. Patient has thick disfigured discolored nails both feet.  Nails are painful walking and wearing her shoes.  She presents for preventive foot care services. Review of Systems  All other systems reviewed and are negative.      Objective:   Physical Exam GENERAL APPEARANCE: Alert, conversant. Appropriately groomed. No acute distress.  VASCULAR: Pedal pulses palpable at  [facility] and PT bilateral.  Capillary refill time is immediate to all digits,  Normal temperature gradient.  Digital hair growth is present bilateral  NEUROLOGIC: sensation is normal to 5.07 monofilament at 5/5 sites bilateral.  Light touch is intact bilateral, Muscle strength normal.  MUSCULOSKELETAL: acceptable muscle strength, tone and stability bilateral.  Intrinsic muscluature intact bilateral.  Rectus appearance of foot and digits noted bilateral.   DERMATOLOGIC: skin color, texture, and turgor are within normal limits.  No preulcerative lesions or ulcers  are seen, no interdigital maceration noted.  No open lesions present.  . No drainage noted.  NAILS  Thick disfigured discolored nails both feet.       Assessment & Plan:  Onychomycosis  Debridement of onychomycosis  RTC 9 weeks

## 2014-11-10 ENCOUNTER — Ambulatory Visit
Admission: RE | Admit: 2014-11-10 | Discharge: 2014-11-10 | Disposition: A | Discharge status: Home / Self Care | Payer: Medicare Other | Source: Ambulatory Visit

## 2014-11-10 DIAGNOSIS — Z1231 Encounter for screening mammogram for malignant neoplasm of breast: Secondary | ICD-10-CM

## 2014-11-15 ENCOUNTER — Other Ambulatory Visit: Payer: Self-pay | Admitting: Obstetrics and Gynecology

## 2014-11-15 DIAGNOSIS — R928 Other abnormal and inconclusive findings on diagnostic imaging of breast: Secondary | ICD-10-CM

## 2014-11-21 ENCOUNTER — Ambulatory Visit
Admission: RE | Admit: 2014-11-21 | Discharge: 2014-11-21 | Disposition: A | Discharge status: Home / Self Care | Payer: Medicare Other | Source: Ambulatory Visit | Attending: Obstetrics and Gynecology | Admitting: Obstetrics and Gynecology

## 2014-11-21 ENCOUNTER — Other Ambulatory Visit: Payer: Self-pay | Admitting: Obstetrics and Gynecology

## 2014-11-21 DIAGNOSIS — R928 Other abnormal and inconclusive findings on diagnostic imaging of breast: Secondary | ICD-10-CM

## 2014-12-22 ENCOUNTER — Ambulatory Visit (INDEPENDENT_AMBULATORY_CARE_PROVIDER_SITE_OTHER): Payer: Medicare Other | Admitting: Podiatry

## 2014-12-22 DIAGNOSIS — B351 Tinea unguium: Secondary | ICD-10-CM

## 2014-12-22 DIAGNOSIS — M79676 Pain in unspecified toe(s): Secondary | ICD-10-CM | POA: Diagnosis not present

## 2014-12-22 NOTE — Progress Notes (Signed)
   Subjective:    Patient ID: Tina Frank, female    DOB: 04/29/1940, 74 y.o.   MRN: [id number]  HPI  Patient presents here today for B/L toenail trim. Patient has thick disfigured discolored nails both feet.  Nails are painful walking and wearing her shoes.  She presents for preventive foot care services. Review of Systems  All other systems reviewed and are negative.      Objective:   Physical Exam GENERAL APPEARANCE: Alert, conversant. Appropriately groomed. No acute distress.  VASCULAR: Pedal pulses palpable at  [facility] and PT bilateral.  Capillary refill time is immediate to all digits,  Normal temperature gradient.  Digital hair growth is present bilateral  NEUROLOGIC: sensation is normal to 5.07 monofilament at 5/5 sites bilateral.  Light touch is intact bilateral, Muscle strength normal.  MUSCULOSKELETAL: acceptable muscle strength, tone and stability bilateral.  Intrinsic muscluature intact bilateral.  Rectus appearance of foot and digits noted bilateral.   DERMATOLOGIC: skin color, texture, and turgor are within normal limits.  No preulcerative lesions or ulcers  are seen, no interdigital maceration noted.  No open lesions present.  . No drainage noted.  NAILS  Thick disfigured discolored nails both feet.       Assessment & Plan:  Onychomycosis  Debridement of onychomycosis  RTC 9 weeks

## 2015-01-29 ENCOUNTER — Other Ambulatory Visit: Payer: Self-pay | Admitting: Physician Assistant

## 2015-01-29 NOTE — H&P (Signed)
Tina Frank is a 74 year old female who presents today for ongoing bilateral shoulder pain left greater than right.  She had an arthroscopic exam done on 10/23/2014 of her left and right shoulders showing significant primary localized, generalized osteoarthritis of the glenohumeral joint left and right but left greater than right.  She had some adhesions removed of the capsule of the left and has done physical therapy for this with minimal relief. She has tried previous glenohumeral injections which have decreased in effectiveness as well as physical therapy and medications.    Past Medical, Family and, exam findings noted and signed in chart.  Social history: Retired, nonsmoker.  Minimal alcohol use.  Review of Systems: 12 point as per HPI.    ALLERGIES: SULFA.  Medications: Voltaren p.r.n. otherwise in chart.    EXAMINATION: Well-developed, well-nourished  Caucasian female in no apparent distress.  Alert and oriented x 3.  Examination of the left shoulder reveals decreased active range of motion, 90 degrees abduction, forward flexion along with about 45 degrees of external and internal rotation.  Passive range of motion full in all directions.  She has a positive empty can on the left, 4/5 strength,  positive Neer's and Hawkins testing.    X-RAYS: Axillary and AP show good glenoid arrangement as well as cup space.    IMPRESSION: 1. Primary localized glenohumeral arthritis, end-stage on the left as well as the right.    DISPOSITION: At this point, she has failed conservative management and she is ready for a total shoulder replacement.  We will proceed with this and schedule for a total shoulder replacement as she does have good overall functioning of the left shoulder.  Risks and benefits were discussed as well as prognosis and expectant course.  We will go ahead and schedule this and she will call with any questions.

## 2015-02-05 ENCOUNTER — Encounter (HOSPITAL_COMMUNITY)
Admission: RE | Admit: 2015-02-05 | Discharge: 2015-02-05 | Disposition: A | Discharge status: Home / Self Care | Payer: Medicare Other | Source: Ambulatory Visit | Attending: Orthopedic Surgery | Admitting: Orthopedic Surgery

## 2015-02-05 ENCOUNTER — Encounter (HOSPITAL_COMMUNITY): Payer: Self-pay

## 2015-02-05 DIAGNOSIS — Z7982 Long term (current) use of aspirin: Secondary | ICD-10-CM | POA: Diagnosis not present

## 2015-02-05 DIAGNOSIS — Z0183 Encounter for blood typing: Secondary | ICD-10-CM | POA: Diagnosis not present

## 2015-02-05 DIAGNOSIS — E119 Type 2 diabetes mellitus without complications: Secondary | ICD-10-CM | POA: Insufficient documentation

## 2015-02-05 DIAGNOSIS — Z79899 Other long term (current) drug therapy: Secondary | ICD-10-CM | POA: Diagnosis not present

## 2015-02-05 DIAGNOSIS — E785 Hyperlipidemia, unspecified: Secondary | ICD-10-CM | POA: Diagnosis not present

## 2015-02-05 DIAGNOSIS — M19012 Primary osteoarthritis, left shoulder: Secondary | ICD-10-CM | POA: Insufficient documentation

## 2015-02-05 DIAGNOSIS — Z01812 Encounter for preprocedural laboratory examination: Secondary | ICD-10-CM | POA: Insufficient documentation

## 2015-02-05 DIAGNOSIS — Z01818 Encounter for other preprocedural examination: Secondary | ICD-10-CM | POA: Insufficient documentation

## 2015-02-05 DIAGNOSIS — Z7984 Long term (current) use of oral hypoglycemic drugs: Secondary | ICD-10-CM | POA: Diagnosis not present

## 2015-02-05 DIAGNOSIS — I10 Essential (primary) hypertension: Secondary | ICD-10-CM | POA: Diagnosis not present

## 2015-02-05 DIAGNOSIS — Z87891 Personal history of nicotine dependence: Secondary | ICD-10-CM | POA: Insufficient documentation

## 2015-02-05 DIAGNOSIS — K219 Gastro-esophageal reflux disease without esophagitis: Secondary | ICD-10-CM | POA: Insufficient documentation

## 2015-02-05 LAB — CBC WITH DIFFERENTIAL/PLATELET
BASOS ABS: 0 10*3/uL (ref 0.0–0.1)
BASOS PCT: 0 %
EOS ABS: 0.2 10*3/uL (ref 0.0–0.7)
EOS PCT: 2 %
HCT: 42.9 % (ref 36.0–46.0)
Hemoglobin: 14.7 g/dL (ref 12.0–15.0)
Lymphocytes Relative: 24 %
Lymphs Abs: 2 10*3/uL (ref 0.7–4.0)
MCH: 30.1 pg (ref 26.0–34.0)
MCHC: 34.3 g/dL (ref 30.0–36.0)
MCV: 87.7 fL (ref 78.0–100.0)
MONO ABS: 0.7 10*3/uL (ref 0.1–1.0)
Monocytes Relative: 8 %
NEUTROS ABS: 5.4 10*3/uL (ref 1.7–7.7)
Neutrophils Relative %: 66 %
PLATELETS: 233 10*3/uL (ref 150–400)
RBC: 4.89 MIL/uL (ref 3.87–5.11)
RDW: 12.8 % (ref 11.5–15.5)
WBC: 8.4 10*3/uL (ref 4.0–10.5)

## 2015-02-05 LAB — COMPREHENSIVE METABOLIC PANEL
ALBUMIN: 3.4 g/dL — AB (ref 3.5–5.0)
ALT: 14 U/L (ref 14–54)
ANION GAP: 10 (ref 5–15)
AST: 18 U/L (ref 15–41)
Alkaline Phosphatase: 91 U/L (ref 38–126)
BUN: 16 mg/dL (ref 6–20)
CHLORIDE: 103 mmol/L (ref 101–111)
CO2: 25 mmol/L (ref 22–32)
Calcium: 9.8 mg/dL (ref 8.9–10.3)
Creatinine, Ser: 0.84 mg/dL (ref 0.44–1.00)
GFR calc Af Amer: >60 mL/min (ref >60)
GFR calc non Af Amer: >60 mL/min (ref >60)
GLUCOSE: 132 mg/dL — AB (ref 65–99)
POTASSIUM: 3.4 mmol/L — AB (ref 3.5–5.1)
SODIUM: 138 mmol/L (ref 135–145)
Total Bilirubin: 0.8 mg/dL (ref 0.3–1.2)
Total Protein: 6.7 g/dL (ref 6.5–8.1)

## 2015-02-05 LAB — APTT: APTT: 33 s (ref 24–37)

## 2015-02-05 LAB — TYPE AND SCREEN
ABO/RH(D): O POS
ANTIBODY SCREEN: NEGATIVE

## 2015-02-05 LAB — SURGICAL PCR SCREEN
MRSA, PCR: NEGATIVE
STAPHYLOCOCCUS AUREUS: NEGATIVE

## 2015-02-05 LAB — PROTIME-INR
INR: 1.04 (ref 0.00–1.49)
Prothrombin Time: 13.8 seconds (ref 11.6–15.2)

## 2015-02-05 MED ORDER — LACTATED RINGERS IV SOLN: INTRAVENOUS | Status: DC

## 2015-02-05 NOTE — Pre-Procedure Instructions (Signed)
    DON RUSCHAK  02/05/2015      [facility] - [place], [place] [address] Phone: [phone] Fax: [phone]    Your procedure is scheduled on 02/14/15.  Report to [facility] Admitting at 630 A.M.  Call this number if you have problems the morning of surgery:  [phone]   Remember:  Do not eat food or drink liquids after midnight.  Take these medicines the morning of surgery with A SIP OF WATER----tylenol,carvedilol,hydralazine   Do not wear jewelry, make-up or nail polish.  Do not wear lotions, powders, or perfumes.  You may wear deodorant.  Do not shave 48 hours prior to surgery.  Men may shave face and neck.  Do not bring valuables to the hospital.  [facility] is not responsible for any belongings or valuables.  Contacts, dentures or bridgework may not be worn into surgery.  Leave your suitcase in the car.  After surgery it may be brought to your room.  For patients admitted to the hospital, discharge time will be determined by your treatment team.  Patients discharged the day of surgery will not be allowed to drive home.   Name and phone number of your driver:    Special instructions:   Please read over the following fact sheets that you were given. Pain Booklet, Coughing and Deep Breathing, Blood Transfusion Information, MRSA Information and Surgical Site Infection Prevention

## 2015-02-06 LAB — URINE CULTURE

## 2015-02-06 LAB — HEMOGLOBIN A1C
HEMOGLOBIN A1C: 6.5 % — AB (ref 4.8–5.6)
MEAN PLASMA GLUCOSE: 140 mg/dL

## 2015-02-07 NOTE — Progress Notes (Signed)
Anesthesia Chart Review:  Pt is 74 year old female scheduled for L total shoulder arthroplasty, R shoulder cortisone injection on 02/14/2015 with Dr. Maryla Morrow.   PMH includes:  HTN, DM, hyperlipidemia, GERD. Former smoker. BMI 27. S/p L THA 07/27/13. S/p parathyroidectomy 12/02/12.   Medications include: ASA, lipitor, carvedilol, clonidine, lasix, hydralazine, metformin, telmisartan.   Preoperative labs reviewed.  HgbA1c 6.5, glucose 132.   EKG 02/05/15: NSR.   Echo 10/06/13:  - Left ventricle: The cavity size was normal. Systolic function was vigorous. The estimated ejection fraction was in the range of 65% to 70%. Wall motion was normal; there were no regional wall motion abnormalities. Left ventricular diastolic function parameters were normal. - Tricuspid valve: There was mild regurgitation. Jet is eccentric and may be underestimated.  If no changes, I anticipate pt can proceed with surgery as scheduled.   Willeen Cass, FNP-BC [facility]/Anesthesiology Phone: [phone] 02/07/2015 1:36 PM

## 2015-02-07 NOTE — Progress Notes (Signed)
error 

## 2015-02-12 ENCOUNTER — Other Ambulatory Visit: Payer: Self-pay | Admitting: Obstetrics and Gynecology

## 2015-02-12 DIAGNOSIS — N6310 Unspecified lump in the right breast, unspecified quadrant: Secondary | ICD-10-CM

## 2015-02-13 MED ORDER — CHLORHEXIDINE GLUCONATE 4 % EX LIQD: 60.0000 mL | Freq: Once | CUTANEOUS | Status: DC

## 2015-02-13 MED ORDER — CEFAZOLIN SODIUM-DEXTROSE 2-3 GM-% IV SOLR
2.0000 g | INTRAVENOUS | Status: AC
  Administered 2015-02-14: 2 g via INTRAVENOUS
  Filled 2015-02-13: qty 50

## 2015-02-14 ENCOUNTER — Inpatient Hospital Stay (HOSPITAL_COMMUNITY): Payer: Medicare Other | Admitting: Anesthesiology

## 2015-02-14 ENCOUNTER — Inpatient Hospital Stay (HOSPITAL_BASED_OUTPATIENT_CLINIC_OR_DEPARTMENT_OTHER)
Admission: RE | Admit: 2015-02-14 | Discharge: 2015-02-15 | DRG: 483 | Disposition: A | Discharge status: Home / Self Care | Payer: Medicare Other | Source: Ambulatory Visit | Attending: Orthopedic Surgery | Admitting: Orthopedic Surgery

## 2015-02-14 ENCOUNTER — Inpatient Hospital Stay (HOSPITAL_COMMUNITY): Payer: Medicare Other

## 2015-02-14 ENCOUNTER — Encounter (HOSPITAL_COMMUNITY)
Admission: RE | Disposition: A | Discharge status: Home / Self Care | Payer: Self-pay | Source: Ambulatory Visit | Attending: Orthopedic Surgery

## 2015-02-14 ENCOUNTER — Encounter (HOSPITAL_COMMUNITY): Payer: Self-pay | Admitting: Certified Registered Nurse Anesthetist

## 2015-02-14 ENCOUNTER — Inpatient Hospital Stay (HOSPITAL_COMMUNITY): Payer: Medicare Other | Admitting: Emergency Medicine

## 2015-02-14 DIAGNOSIS — E119 Type 2 diabetes mellitus without complications: Secondary | ICD-10-CM | POA: Diagnosis present

## 2015-02-14 DIAGNOSIS — Z7984 Long term (current) use of oral hypoglycemic drugs: Secondary | ICD-10-CM

## 2015-02-14 DIAGNOSIS — M19012 Primary osteoarthritis, left shoulder: Secondary | ICD-10-CM | POA: Diagnosis present

## 2015-02-14 DIAGNOSIS — Z7982 Long term (current) use of aspirin: Secondary | ICD-10-CM

## 2015-02-14 DIAGNOSIS — Z87891 Personal history of nicotine dependence: Secondary | ICD-10-CM

## 2015-02-14 DIAGNOSIS — K219 Gastro-esophageal reflux disease without esophagitis: Secondary | ICD-10-CM | POA: Diagnosis present

## 2015-02-14 DIAGNOSIS — I10 Essential (primary) hypertension: Secondary | ICD-10-CM | POA: Diagnosis present

## 2015-02-14 DIAGNOSIS — M25512 Pain in left shoulder: Secondary | ICD-10-CM | POA: Diagnosis present

## 2015-02-14 DIAGNOSIS — Z79899 Other long term (current) drug therapy: Secondary | ICD-10-CM

## 2015-02-14 DIAGNOSIS — E785 Hyperlipidemia, unspecified: Secondary | ICD-10-CM | POA: Diagnosis present

## 2015-02-14 DIAGNOSIS — Z96612 Presence of left artificial shoulder joint: Secondary | ICD-10-CM

## 2015-02-14 HISTORY — DX: Type 2 diabetes mellitus without complications: E11.9

## 2015-02-14 HISTORY — PX: TOTAL SHOULDER ARTHROPLASTY: SHX126

## 2015-02-14 HISTORY — PX: STERIOD INJECTION: SHX5046

## 2015-02-14 LAB — GLUCOSE, CAPILLARY
GLUCOSE-CAPILLARY: 146 mg/dL — AB (ref 65–99)
GLUCOSE-CAPILLARY: 145 mg/dL — AB (ref 65–99)
Glucose-Capillary: 136 mg/dL — ABNORMAL HIGH (ref 65–99)
Glucose-Capillary: 171 mg/dL — ABNORMAL HIGH (ref 65–99)
Glucose-Capillary: 178 mg/dL — ABNORMAL HIGH (ref 65–99)

## 2015-02-14 SURGERY — ARTHROPLASTY, SHOULDER, TOTAL
Anesthesia: General | Site: Shoulder | Laterality: Right

## 2015-02-14 MED ORDER — PROPOFOL 10 MG/ML IV BOLUS
INTRAVENOUS | Status: AC
  Filled 2015-02-14: qty 20

## 2015-02-14 MED ORDER — BUPIVACAINE HCL (PF) 0.5 % IJ SOLN
INTRAMUSCULAR | Status: DC | PRN
  Administered 2015-02-14: 20 mL via PERINEURAL

## 2015-02-14 MED ORDER — MAGNESIUM CITRATE PO SOLN: 1.0000 | Freq: Once | ORAL | Status: DC | PRN

## 2015-02-14 MED ORDER — ROCURONIUM BROMIDE 50 MG/5ML IV SOLN
INTRAVENOUS | Status: AC
  Filled 2015-02-14: qty 1

## 2015-02-14 MED ORDER — GLYCOPYRROLATE 0.2 MG/ML IJ SOLN
INTRAMUSCULAR | Status: DC | PRN
  Administered 2015-02-14: 0.2 mg via INTRAVENOUS
  Administered 2015-02-14: .6 mg via INTRAVENOUS
  Administered 2015-02-14: 0.2 mg via INTRAVENOUS

## 2015-02-14 MED ORDER — ONDANSETRON HCL 4 MG/2ML IJ SOLN
INTRAMUSCULAR | Status: AC
  Filled 2015-02-14: qty 2

## 2015-02-14 MED ORDER — SUCCINYLCHOLINE CHLORIDE 20 MG/ML IJ SOLN
INTRAMUSCULAR | Status: AC
  Filled 2015-02-14: qty 1

## 2015-02-14 MED ORDER — SODIUM CHLORIDE 0.9 % IR SOLN: Status: DC | PRN

## 2015-02-14 MED ORDER — GLYCOPYRROLATE 0.2 MG/ML IJ SOLN
INTRAMUSCULAR | Status: AC
  Filled 2015-02-14: qty 1

## 2015-02-14 MED ORDER — HYDRALAZINE HCL 50 MG PO TABS
50.0000 mg | ORAL_TABLET | Freq: Two times a day (BID) | ORAL | Status: DC
  Filled 2015-02-14 (×2): qty 1

## 2015-02-14 MED ORDER — ROCURONIUM BROMIDE 100 MG/10ML IV SOLN
INTRAVENOUS | Status: DC | PRN
  Administered 2015-02-14: 20 mg via INTRAVENOUS

## 2015-02-14 MED ORDER — SUCCINYLCHOLINE CHLORIDE 20 MG/ML IJ SOLN
INTRAMUSCULAR | Status: DC | PRN
  Administered 2015-02-14: 100 mg via INTRAVENOUS

## 2015-02-14 MED ORDER — KETOROLAC TROMETHAMINE 30 MG/ML IJ SOLN
INTRAMUSCULAR | Status: AC
  Filled 2015-02-14: qty 1

## 2015-02-14 MED ORDER — BISACODYL 5 MG PO TBEC: 5.0000 mg | DELAYED_RELEASE_TABLET | Freq: Every day | ORAL | Status: DC | PRN

## 2015-02-14 MED ORDER — HYDROMORPHONE HCL 1 MG/ML IJ SOLN: 0.5000 mg | INTRAMUSCULAR | Status: DC | PRN

## 2015-02-14 MED ORDER — FUROSEMIDE 20 MG PO TABS
20.0000 mg | ORAL_TABLET | Freq: Two times a day (BID) | ORAL | Status: DC
Start: 2015-02-14 — End: 2015-02-15
  Administered 2015-02-14 – 2015-02-15 (×2): 20 mg via ORAL
  Filled 2015-02-14 (×2): qty 1

## 2015-02-14 MED ORDER — ONDANSETRON HCL 4 MG/2ML IJ SOLN
INTRAMUSCULAR | Status: DC | PRN
  Administered 2015-02-14: 4 mg via INTRAVENOUS

## 2015-02-14 MED ORDER — BUPIVACAINE HCL 0.5 % IJ SOLN
INTRAMUSCULAR | Status: DC | PRN
  Administered 2015-02-14: 4 mL via INTRA_ARTICULAR

## 2015-02-14 MED ORDER — ATORVASTATIN CALCIUM 10 MG PO TABS
10.0000 mg | ORAL_TABLET | Freq: Every day | ORAL | Status: DC
  Administered 2015-02-14: 10 mg via ORAL
  Filled 2015-02-14: qty 1

## 2015-02-14 MED ORDER — PROPOFOL 10 MG/ML IV BOLUS
INTRAVENOUS | Status: DC | PRN
  Administered 2015-02-14: 70 mg via INTRAVENOUS

## 2015-02-14 MED ORDER — POTASSIUM CHLORIDE IN NACL 20-0.9 MEQ/L-% IV SOLN
INTRAVENOUS | Status: DC
  Administered 2015-02-14 – 2015-02-15 (×2): via INTRAVENOUS
  Filled 2015-02-14 (×3): qty 1000

## 2015-02-14 MED ORDER — NEOSTIGMINE METHYLSULFATE 10 MG/10ML IV SOLN
INTRAVENOUS | Status: DC | PRN
  Administered 2015-02-14: 3 mg via INTRAVENOUS

## 2015-02-14 MED ORDER — FENTANYL CITRATE (PF) 250 MCG/5ML IJ SOLN
INTRAMUSCULAR | Status: AC
  Filled 2015-02-14: qty 5

## 2015-02-14 MED ORDER — ZOLPIDEM TARTRATE 5 MG PO TABS: 5.0000 mg | ORAL_TABLET | Freq: Every evening | ORAL | Status: DC | PRN

## 2015-02-14 MED ORDER — CARVEDILOL 12.5 MG PO TABS
12.5000 mg | ORAL_TABLET | Freq: Two times a day (BID) | ORAL | Status: DC
  Administered 2015-02-14 – 2015-02-15 (×2): 12.5 mg via ORAL
  Filled 2015-02-14 (×2): qty 1

## 2015-02-14 MED ORDER — HYDROCODONE-ACETAMINOPHEN 5-325 MG PO TABS: 1.0000 | ORAL_TABLET | ORAL | Status: DC | PRN

## 2015-02-14 MED ORDER — LACTATED RINGERS IV SOLN
INTRAVENOUS | Status: DC | PRN
  Administered 2015-02-14 (×2): via INTRAVENOUS

## 2015-02-14 MED ORDER — LIDOCAINE-EPINEPHRINE (PF) 1.5 %-1:200000 IJ SOLN
INTRAMUSCULAR | Status: DC | PRN
  Administered 2015-02-14: 10 mL via PERINEURAL

## 2015-02-14 MED ORDER — ONDANSETRON HCL 4 MG PO TABS: 4.0000 mg | ORAL_TABLET | Freq: Three times a day (TID) | ORAL | Status: DC | PRN

## 2015-02-14 MED ORDER — 0.9 % SODIUM CHLORIDE (POUR BTL) OPTIME
TOPICAL | Status: DC | PRN
  Administered 2015-02-14: 1000 mL

## 2015-02-14 MED ORDER — DOCUSATE SODIUM 100 MG PO CAPS
100.0000 mg | ORAL_CAPSULE | Freq: Two times a day (BID) | ORAL | Status: DC
  Administered 2015-02-14 – 2015-02-15 (×3): 100 mg via ORAL
  Filled 2015-02-14 (×3): qty 1

## 2015-02-14 MED ORDER — MIDODRINE HCL 5 MG PO TABS
10.0000 mg | ORAL_TABLET | Freq: Once | ORAL | Status: AC
  Administered 2015-02-14: 10 mg via ORAL
  Filled 2015-02-14 (×2): qty 2

## 2015-02-14 MED ORDER — EPHEDRINE SULFATE 50 MG/ML IJ SOLN
INTRAMUSCULAR | Status: AC
  Filled 2015-02-14: qty 1

## 2015-02-14 MED ORDER — ACETAMINOPHEN 325 MG PO TABS: 650.0000 mg | ORAL_TABLET | Freq: Four times a day (QID) | ORAL | Status: DC | PRN

## 2015-02-14 MED ORDER — BUPIVACAINE HCL (PF) 0.5 % IJ SOLN
INTRAMUSCULAR | Status: AC
  Filled 2015-02-14: qty 10

## 2015-02-14 MED ORDER — PROMETHAZINE HCL 25 MG/ML IJ SOLN: 6.2500 mg | INTRAMUSCULAR | Status: DC | PRN

## 2015-02-14 MED ORDER — INSULIN ASPART 100 UNIT/ML ~~LOC~~ SOLN
0.0000 [IU] | Freq: Three times a day (TID) | SUBCUTANEOUS | Status: DC
  Administered 2015-02-14 (×2): 1 [IU] via SUBCUTANEOUS

## 2015-02-14 MED ORDER — BISACODYL 10 MG RE SUPP: 10.0000 mg | Freq: Every day | RECTAL | Status: DC | PRN

## 2015-02-14 MED ORDER — MENTHOL 3 MG MT LOZG: 1.0000 | LOZENGE | OROMUCOSAL | Status: DC | PRN

## 2015-02-14 MED ORDER — METOCLOPRAMIDE HCL 5 MG/ML IJ SOLN: 5.0000 mg | Freq: Three times a day (TID) | INTRAMUSCULAR | Status: DC | PRN

## 2015-02-14 MED ORDER — DEXAMETHASONE SODIUM PHOSPHATE 10 MG/ML IJ SOLN
INTRAMUSCULAR | Status: DC | PRN
  Administered 2015-02-14: 10 mg via INTRAVENOUS

## 2015-02-14 MED ORDER — ACETAMINOPHEN 650 MG RE SUPP: 650.0000 mg | Freq: Four times a day (QID) | RECTAL | Status: DC | PRN

## 2015-02-14 MED ORDER — HYDROMORPHONE HCL 1 MG/ML IJ SOLN: 0.2500 mg | INTRAMUSCULAR | Status: DC | PRN

## 2015-02-14 MED ORDER — METHYLPREDNISOLONE ACETATE 80 MG/ML IJ SUSP
INTRAMUSCULAR | Status: DC | PRN
  Administered 2015-02-14: 80 mg via INTRA_ARTICULAR

## 2015-02-14 MED ORDER — MIDAZOLAM HCL 2 MG/2ML IJ SOLN
INTRAMUSCULAR | Status: AC
  Filled 2015-02-14: qty 2

## 2015-02-14 MED ORDER — KETOROLAC TROMETHAMINE 30 MG/ML IJ SOLN
30.0000 mg | Freq: Once | INTRAMUSCULAR | Status: AC
  Administered 2015-02-14: 30 mg via INTRAVENOUS

## 2015-02-14 MED ORDER — FENTANYL CITRATE (PF) 100 MCG/2ML IJ SOLN
INTRAMUSCULAR | Status: DC | PRN
  Administered 2015-02-14 (×2): 50 ug via INTRAVENOUS

## 2015-02-14 MED ORDER — ONDANSETRON HCL 4 MG PO TABS: 4.0000 mg | ORAL_TABLET | Freq: Four times a day (QID) | ORAL | Status: DC | PRN

## 2015-02-14 MED ORDER — IRBESARTAN 150 MG PO TABS
150.0000 mg | ORAL_TABLET | Freq: Every day | ORAL | Status: DC
  Administered 2015-02-14: 150 mg via ORAL
  Filled 2015-02-14 (×2): qty 1

## 2015-02-14 MED ORDER — METFORMIN HCL ER 500 MG PO TB24
500.0000 mg | ORAL_TABLET | Freq: Every day | ORAL | Status: DC
  Administered 2015-02-15: 500 mg via ORAL
  Filled 2015-02-14: qty 1

## 2015-02-14 MED ORDER — EPHEDRINE SULFATE 50 MG/ML IJ SOLN
INTRAMUSCULAR | Status: DC | PRN
  Administered 2015-02-14 (×7): 10 mg via INTRAVENOUS

## 2015-02-14 MED ORDER — METHYLPREDNISOLONE ACETATE 80 MG/ML IJ SUSP
INTRAMUSCULAR | Status: AC
  Filled 2015-02-14: qty 1

## 2015-02-14 MED ORDER — ARTIFICIAL TEARS OP OINT
TOPICAL_OINTMENT | OPHTHALMIC | Status: AC
  Filled 2015-02-14: qty 3.5

## 2015-02-14 MED ORDER — LIDOCAINE HCL (CARDIAC) 20 MG/ML IV SOLN
INTRAVENOUS | Status: DC | PRN
  Administered 2015-02-14: 60 mg via INTRAVENOUS

## 2015-02-14 MED ORDER — GLYCOPYRROLATE 0.2 MG/ML IJ SOLN
INTRAMUSCULAR | Status: AC
  Filled 2015-02-14: qty 2

## 2015-02-14 MED ORDER — LIDOCAINE HCL (CARDIAC) 20 MG/ML IV SOLN
INTRAVENOUS | Status: AC
  Filled 2015-02-14: qty 5

## 2015-02-14 MED ORDER — CLONIDINE HCL 0.1 MG PO TABS
0.1000 mg | ORAL_TABLET | Freq: Every day | ORAL | Status: DC | PRN
Start: 2015-02-14 — End: 2015-02-15

## 2015-02-14 MED ORDER — PHENYLEPHRINE HCL 10 MG/ML IJ SOLN
10.0000 mg | INTRAVENOUS | Status: DC | PRN
  Administered 2015-02-14: 20 ug/min via INTRAVENOUS

## 2015-02-14 MED ORDER — ONDANSETRON HCL 4 MG/2ML IJ SOLN: 4.0000 mg | Freq: Four times a day (QID) | INTRAMUSCULAR | Status: DC | PRN

## 2015-02-14 MED ORDER — DEXAMETHASONE SODIUM PHOSPHATE 10 MG/ML IJ SOLN
INTRAMUSCULAR | Status: AC
  Filled 2015-02-14: qty 1

## 2015-02-14 MED ORDER — METOCLOPRAMIDE HCL 5 MG PO TABS
5.0000 mg | ORAL_TABLET | Freq: Three times a day (TID) | ORAL | Status: DC | PRN
Start: 2015-02-14 — End: 2015-02-15

## 2015-02-14 MED ORDER — PHENYLEPHRINE 40 MCG/ML (10ML) SYRINGE FOR IV PUSH (FOR BLOOD PRESSURE SUPPORT)
PREFILLED_SYRINGE | INTRAVENOUS | Status: AC
  Filled 2015-02-14: qty 10

## 2015-02-14 MED ORDER — MIDAZOLAM HCL 5 MG/5ML IJ SOLN
INTRAMUSCULAR | Status: DC | PRN
  Administered 2015-02-14: 1 mg via INTRAVENOUS

## 2015-02-14 MED ORDER — NEOSTIGMINE METHYLSULFATE 10 MG/10ML IV SOLN
INTRAVENOUS | Status: AC
  Filled 2015-02-14: qty 1

## 2015-02-14 MED ORDER — OXYCODONE HCL 5 MG PO TABS
5.0000 mg | ORAL_TABLET | ORAL | Status: DC | PRN
  Administered 2015-02-15: 10 mg via ORAL
  Filled 2015-02-14: qty 2

## 2015-02-14 MED ORDER — PHENOL 1.4 % MT LIQD: 1.0000 | OROMUCOSAL | Status: DC | PRN

## 2015-02-14 MED ORDER — POLYETHYLENE GLYCOL 3350 17 G PO PACK
17.0000 g | PACK | Freq: Every day | ORAL | Status: DC | PRN
Start: 2015-02-14 — End: 2015-02-15

## 2015-02-14 MED ORDER — CEFAZOLIN SODIUM 1-5 GM-% IV SOLN
1.0000 g | Freq: Four times a day (QID) | INTRAVENOUS | Status: AC
  Administered 2015-02-14 – 2015-02-15 (×3): 1 g via INTRAVENOUS
  Filled 2015-02-14 (×3): qty 50

## 2015-02-14 SURGICAL SUPPLY — 61 items
AID PSTN UNV HD RSTRNT DISP (MISCELLANEOUS) ×2
APL SKNCLS STERI-STRIP NONHPOA (GAUZE/BANDAGES/DRESSINGS) ×2
BENZOIN TINCTURE PRP APPL 2/3 (GAUZE/BANDAGES/DRESSINGS) ×4 IMPLANT
BLADE SAW SGTL 83.5X18.5 (BLADE) ×4 IMPLANT
BOWL SMART MIX CTS (DISPOSABLE) ×2 IMPLANT
BRUSH FEMORAL CANAL (MISCELLANEOUS) IMPLANT
BUR SURG 4X8 MED (BURR) IMPLANT
BURR SURG 4MMX8MM MEDIUM (BURR) ×1
BURR SURG 4X8 MED (BURR) ×3
CAPT SHLDR TOTAL 2 ×2 IMPLANT
CEMENT BONE SIMPLEX SPEEDSET (Cement) ×4 IMPLANT
CLOSURE WOUND 1/2 X4 (GAUZE/BANDAGES/DRESSINGS) ×1
COVER SURGICAL LIGHT HANDLE (MISCELLANEOUS) ×4 IMPLANT
DRAPE IMP U-DRAPE 54X76 (DRAPES) ×4 IMPLANT
DRAPE ORTHO SPLIT 77X108 STRL (DRAPES) ×8
DRAPE SURG ORHT 6 SPLT 77X108 (DRAPES) ×4 IMPLANT
DRAPE U-SHAPE 47X51 STRL (DRAPES) ×4 IMPLANT
DRSG AQUACEL AG ADV 3.5X10 (GAUZE/BANDAGES/DRESSINGS) ×4 IMPLANT
DURAPREP 26ML APPLICATOR (WOUND CARE) ×4 IMPLANT
ELECT CAUTERY BLADE 6.4 (BLADE) ×4 IMPLANT
ELECT REM PT RETURN 9FT ADLT (ELECTROSURGICAL) ×4
ELECTRODE REM PT RTRN 9FT ADLT (ELECTROSURGICAL) ×2 IMPLANT
EVACUATOR 1/8 PVC DRAIN (DRAIN) IMPLANT
FACESHIELD WRAPAROUND (MASK) ×8 IMPLANT
FACESHIELD WRAPAROUND OR TEAM (MASK) ×4 IMPLANT
GLOVE BIOGEL PI IND STRL 7.0 (GLOVE) ×2 IMPLANT
GLOVE BIOGEL PI INDICATOR 7.0 (GLOVE) ×2
GLOVE ECLIPSE 7.0 STRL STRAW (GLOVE) ×4 IMPLANT
GLOVE ORTHO TXT STRL SZ7.5 (GLOVE) ×4 IMPLANT
GLOVE SURG ORTHO 8.0 STRL STRW (GLOVE) ×2 IMPLANT
GOWN STRL REUS W/ TWL LRG LVL3 (GOWN DISPOSABLE) ×4 IMPLANT
GOWN STRL REUS W/TWL LRG LVL3 (GOWN DISPOSABLE) ×8
HANDPIECE INTERPULSE COAX TIP (DISPOSABLE) ×4
KIT BASIN OR (CUSTOM PROCEDURE TRAY) ×4 IMPLANT
KIT ROOM TURNOVER OR (KITS) ×4 IMPLANT
MANIFOLD NEPTUNE II (INSTRUMENTS) ×4 IMPLANT
NDL HYPO 25GX1X1/2 BEV (NEEDLE) ×2 IMPLANT
NEEDLE HYPO 25GX1X1/2 BEV (NEEDLE) ×4 IMPLANT
NS IRRIG 1000ML POUR BTL (IV SOLUTION) ×4 IMPLANT
PACK SHOULDER (CUSTOM PROCEDURE TRAY) ×4 IMPLANT
PAD ARMBOARD 7.5X6 YLW CONV (MISCELLANEOUS) ×8 IMPLANT
RESTRAINT HEAD UNIVERSAL NS (MISCELLANEOUS) ×2 IMPLANT
SET HNDPC FAN SPRY TIP SCT (DISPOSABLE) IMPLANT
SLING ARM IMMOBILIZER LRG (SOFTGOODS) IMPLANT
SLING ARM IMMOBILIZER MED (SOFTGOODS) ×2 IMPLANT
SLING ARM IMMOBILIZER XL (CAST SUPPLIES) IMPLANT
SPONGE LAP 18X18 X RAY DECT (DISPOSABLE) ×4 IMPLANT
STRIP CLOSURE SKIN 1/2X4 (GAUZE/BANDAGES/DRESSINGS) ×3 IMPLANT
SUCTION FRAZIER TIP 10 FR DISP (SUCTIONS) ×4 IMPLANT
SUT FIBERWIRE #2 38 REV NDL BL (SUTURE) ×8
SUT MNCRL AB 4-0 PS2 18 (SUTURE) ×4 IMPLANT
SUT MON AB 2-0 CT1 36 (SUTURE) ×4 IMPLANT
SUT VIC AB 0 CT1 27 (SUTURE) ×4
SUT VIC AB 0 CT1 27XBRD ANBCTR (SUTURE) ×2 IMPLANT
SUT VIC AB 2-0 CT1 27 (SUTURE) ×4
SUT VIC AB 2-0 CT1 TAPERPNT 27 (SUTURE) ×2 IMPLANT
SUTURE FIBERWR#2 38 REV NDL BL (SUTURE) ×4 IMPLANT
SYR CONTROL 10ML LL (SYRINGE) ×4 IMPLANT
TOWEL OR 17X24 6PK STRL BLUE (TOWEL DISPOSABLE) ×4 IMPLANT
TOWEL OR 17X26 10 PK STRL BLUE (TOWEL DISPOSABLE) ×4 IMPLANT
WATER STERILE IRR 1000ML POUR (IV SOLUTION) ×4 IMPLANT

## 2015-02-14 NOTE — Transfer of Care (Signed)
Immediate Anesthesia Transfer of Care Note  Patient: Tina Frank  Procedure(s) Performed: Procedure(s): LEFT TOTAL SHOULDER ARTHROPLASTY (Left) RIGHT SHOULDER CORTISONE INJECTION (Right)  Patient Location: PACU  Anesthesia Type:General  Level of Consciousness: awake  Airway & Oxygen Therapy: Patient Spontanous Breathing and Patient connected to nasal cannula oxygen  Post-op Assessment: Report given to RN and Post -op Vital signs reviewed and stable  Post vital signs: stable  Last Vitals:  Filed Vitals:   02/14/15 1045 02/14/15 1049  BP: 83/53 92/55  Pulse: 63 65  Temp:    Resp: 18 17    Complications: No apparent anesthesia complications

## 2015-02-14 NOTE — Discharge Instructions (Signed)
Total shoulder replacement Care After Instructions Refer to this sheet in the next few weeks. These discharge instructions provide you with general information on caring for yourself after you leave the hospital. Your caregiver may also give you specific instructions. Your treatment has been planned according to the most current medical practices available, but unavoidable complications sometimes occur. If you have any problems or questions after discharge, please call your caregiver. HOME INSTRUCTIONS  May shower in 5 days but do not get incision wet Change bandages (dressings) in 3 days Only take over-the-counter or prescription medicines for pain, discomfort, or fever as directed by your caregiver.  Wear your sling for the next 6 weeks unless otherwise instructed. Eat a well-balanced diet.  Avoid lifting or driving until you are instructed otherwise.  Make an appointment to see your caregiver for stitches (suture) or staple removal one week after surgery.   SEEK MEDICAL CARE IF: You have swelling of your calf or leg.  You develop shortness of breath or chest pain.  You have redness, swelling, or increasing pain in the wound.  There is pus or any unusual drainage coming from the surgical site.  You notice a bad smell coming from the surgical site or dressing.  The surgical site breaks open after sutures or staples have been removed.  There is persistent bleeding from the suture or staple line.  You are getting worse or are not improving.  You have any other questions or concerns.  SEEK IMMEDIATE MEDICAL CARE IF:  You have a fever greater than 101 You develop a rash.  You have difficulty breathing.  You develop any reaction or side effects to medicines given.  Your knee motion is decreasing rather than improving.  MAKE SURE YOU:  Understand these instructions.  Will watch your condition.  Will get help right away if you are not doing well or get worse.

## 2015-02-14 NOTE — Progress Notes (Signed)
Utilization review completed.  

## 2015-02-14 NOTE — Anesthesia Preprocedure Evaluation (Signed)
Anesthesia Evaluation  Patient identified by MRN, date of birth, ID band Patient awake    Reviewed: Allergy & Precautions, NPO status , Patient's Chart, lab work & pertinent test results  Airway Mallampati: II  TM Distance: >3 FB Neck ROM: Full    Dental no notable dental hx.    Pulmonary neg pulmonary ROS, former smoker,    Pulmonary exam normal breath sounds clear to auscultation       Cardiovascular hypertension, Pt. on medications Normal cardiovascular exam Rhythm:Regular Rate:Normal     Neuro/Psych negative neurological ROS  negative psych ROS   GI/Hepatic Neg liver ROS, GERD  ,  Endo/Other  diabetes  Renal/GU negative Renal ROS  negative genitourinary   Musculoskeletal negative musculoskeletal ROS (+)   Abdominal   Peds negative pediatric ROS (+)  Hematology negative hematology ROS (+)   Anesthesia Other Findings   Reproductive/Obstetrics negative OB ROS                             Anesthesia Physical Anesthesia Plan  ASA: II  Anesthesia Plan: General   Post-op Pain Management: GA combined w/ Regional for post-op pain   Induction: Intravenous  Airway Management Planned: Oral ETT  Additional Equipment:   Intra-op Plan:   Post-operative Plan: Extubation in OR  Informed Consent: I have reviewed the patients History and Physical, chart, labs and discussed the procedure including the risks, benefits and alternatives for the proposed anesthesia with the patient or authorized representative who has indicated his/her understanding and acceptance.   Dental advisory given  Plan Discussed with: CRNA and Surgeon  Anesthesia Plan Comments:         Anesthesia Quick Evaluation

## 2015-02-14 NOTE — Progress Notes (Signed)
Dr Kalman Shan made aware of low BP despite administration of pressors by CRNA at bedside upon arrival to PACU. Pt asymptomatic. Per dr Kalman Shan orders will give 10mg  PO midodrine once. Will continue to monitor.

## 2015-02-14 NOTE — H&P (View-Only) (Signed)
Tina Frank is a 74 year old female who presents today for ongoing bilateral shoulder pain left greater than right.  She had an arthroscopic exam done on 10/23/2014 of her left and right shoulders showing significant primary localized, generalized osteoarthritis of the glenohumeral joint left and right but left greater than right.  She had some adhesions removed of the capsule of the left and has done physical therapy for this with minimal relief. She has tried previous glenohumeral injections which have decreased in effectiveness as well as physical therapy and medications.    Past Medical, Family and, exam findings noted and signed in chart.  Social history: Retired, nonsmoker.  Minimal alcohol use.  Review of Systems: 12 point as per HPI.    ALLERGIES: SULFA.  Medications: Voltaren p.r.n. otherwise in chart.    EXAMINATION: Well-developed, well-nourished  Caucasian female in no apparent distress.  Alert and oriented x 3.  Examination of the left shoulder reveals decreased active range of motion, 90 degrees abduction, forward flexion along with about 45 degrees of external and internal rotation.  Passive range of motion full in all directions.  She has a positive empty can on the left, 4/5 strength,  positive Neer's and Hawkins testing.    X-RAYS: Axillary and AP show good glenoid arrangement as well as cup space.    IMPRESSION: 1. Primary localized glenohumeral arthritis, end-stage on the left as well as the right.    DISPOSITION: At this point, she has failed conservative management and she is ready for a total shoulder replacement.  We will proceed with this and schedule for a total shoulder replacement as she does have good overall functioning of the left shoulder.  Risks and benefits were discussed as well as prognosis and expectant course.  We will go ahead and schedule this and she will call with any questions.

## 2015-02-14 NOTE — Interval H&P Note (Signed)
History and Physical Interval Note:  02/14/2015 8:26 AM  Tina Frank  has presented today for surgery, with the diagnosis of PRIMARY OSTEOARTHRITIS,LEFT SHOULDER  The various methods of treatment have been discussed with the patient and family. After consideration of risks, benefits and other options for treatment, the patient has consented to  Procedure(s): LEFT TOTAL SHOULDER ARTHROPLASTY (Left) RIGHT SHOULDER CORTISONE INJECTION (Right) as a surgical intervention .  The patient's history has been reviewed, patient examined, no change in status, stable for surgery.  I have reviewed the patient's chart and labs.  Questions were answered to the patient's satisfaction.     [name]

## 2015-02-14 NOTE — Discharge Summary (Addendum)
Patient ID: Tina Frank MRN: [id number] DOB/AGE: 15-Aug-1940 74 y.o.  Admit date: 02/14/2015 Discharge date: 02/15/2015  Admission Diagnoses:  Active Problems:   Degenerative joint disease of left shoulder   Discharge Diagnoses:  Same  Past Medical History  Diagnosis Date  . Obesity     is resolve- pt. highest weight was 180 10 yrs ago.  Marland Kitchen Hypertension   . Hyperlipidemia   . Reflux esophagitis   . Type II diabetes mellitus ([place])   . GERD (gastroesophageal reflux disease)     hx  . Arthritis     "probably in my shoulders and left hip" (02/14/2015)    Surgeries: Procedure(s): LEFT TOTAL SHOULDER ARTHROPLASTY RIGHT SHOULDER CORTISONE INJECTION on 02/14/2015   Consultants:    Discharged Condition: Improved  Hospital Course: Tina Frank is an 74 y.o. female who was admitted 02/14/2015 for operative treatment of primary localized osteoarthritis left shoulder. Patient has severe unremitting pain that affects sleep, daily activities, and work/hobbies. After pre-op clearance the patient was taken to the operating room on 02/14/2015 and underwent  Procedure(s): LEFT TOTAL SHOULDER ARTHROPLASTY RIGHT SHOULDER CORTISONE INJECTION.    Patient was given perioperative antibiotics:      Anti-infectives    Start     Dose/Rate Route Frequency Ordered Stop   02/14/15 1430  ceFAZolin (ANCEF) IVPB 1 g/50 mL premix     1 g 100 mL/hr over 30 Minutes Intravenous Every 6 hours 02/14/15 1310 02/15/15 0253   02/14/15 0600  ceFAZolin (ANCEF) IVPB 2 g/50 mL premix     2 g 100 mL/hr over 30 Minutes Intravenous To [facility]Stay [facility] 02/13/15 0854 02/14/15 0844       Patient was given sequential compression devices, early ambulation, and chemoprophylaxis to prevent DVT.  Patient benefited maximally from hospital stay and there were no complications.    Recent vital signs:  Patient Vitals for the past 24 hrs:  BP Temp Temp src Pulse Resp SpO2 Height Weight  02/15/15 0420 (!) 123/56 mmHg 98.1  F (36.7 C) Oral 84 18 94 % - -  02/14/15 2105 (!) 107/52 mmHg 97.9 F (36.6 C) Oral 71 16 95 % - -  02/14/15 1758 (!) 171/93 mmHg 98.9 F (37.2 C) Oral 77 16 93 % - -  02/14/15 1517 - - - - - 96 % - -  02/14/15 1310 (!) 153/73 mmHg 97.4 F (36.3 C) Oral (!) 55 16 95 % 5\' 4"  (1.626 m) 72 kg (158 lb 11.7 oz)  02/14/15 1245 126/68 mmHg 97.3 F (36.3 C) - (!) 55 14 95 % - -  02/14/15 1230 118/67 mmHg - - (!) 54 14 95 % - -  02/14/15 1215 121/68 mmHg - - (!) 58 14 95 % - -  02/14/15 1200 (!) 84/50 mmHg - - (!) 55 14 95 % - -  02/14/15 1145 (!) 91/52 mmHg - - (!) 56 14 96 % - -  02/14/15 1128 (!) 88/59 mmHg - - 63 15 96 % - -  02/14/15 1115 (!) 90/38 mmHg - - 64 15 96 % - -  02/14/15 1100 (!) 89/53 mmHg - - 66 17 97 % - -  02/14/15 1049 (!) 92/55 mmHg - - 65 17 97 % - -  02/14/15 1045 (!) 83/53 mmHg - - 63 18 97 % - -  02/14/15 1040 (!) 76/48 mmHg 97.4 F (36.3 C) - 63 17 96 % - -     Recent laboratory studies:   Recent  Labs  02/15/15 0430  WBC 12.2*  HGB 11.4*  HCT 34.8*  PLT 229  NA 135  K 3.6  CL 104  CO2 22  BUN 11  CREATININE 0.77  GLUCOSE 132*  CALCIUM 8.8*     Discharge Medications:     Medication List    STOP taking these medications        acetaminophen 325 MG tablet  Commonly known as:  TYLENOL     aspirin EC 81 MG tablet      TAKE these medications        atorvastatin 10 MG tablet  Commonly known as:  LIPITOR  Take 10 mg by mouth at bedtime.     bisacodyl 5 MG EC tablet  Commonly known as:  DULCOLAX  Take 1 tablet (5 mg total) by mouth daily as needed for moderate constipation.     carvedilol 12.5 MG tablet  Commonly known as:  COREG  Take 12.5 mg by mouth 2 (two) times daily with a meal.     cholecalciferol 1000 UNITS tablet  Commonly known as:  VITAMIN D  Take 2,000 Units by mouth daily.     cloNIDine 0.1 MG tablet  Commonly known as:  CATAPRES  Take 0.1 mg by mouth daily as needed (HIGH Blood Pressure).     furosemide 20 MG tablet   Commonly known as:  LASIX  Take 20 mg by mouth 2 (two) times daily.     hydrALAZINE 50 MG tablet  Commonly known as:  APRESOLINE  Take 50 mg by mouth 2 (two) times daily.     HYDROcodone-acetaminophen 5-325 MG tablet  Commonly known as:  NORCO  Take 1-2 tablets by mouth every 4 (four) hours as needed for moderate pain.     metFORMIN 500 MG 24 hr tablet  Commonly known as:  GLUCOPHAGE-XR  Take 500 mg by mouth daily with breakfast.     multivitamin with minerals Tabs tablet  Take 1 tablet by mouth daily. Centrum Silver     ondansetron 4 MG tablet  Commonly known as:  ZOFRAN  Take 1 tablet (4 mg total) by mouth every 8 (eight) hours as needed for nausea or vomiting.     telmisartan 80 MG tablet  Commonly known as:  MICARDIS  Take 0.5 tablets (40 mg total) by mouth daily.        Diagnostic Studies: Dg Shoulder Left Port  02/14/2015  CLINICAL DATA:  Left shoulder replacement EXAM: LEFT SHOULDER - 1 VIEW COMPARISON:  MRI left shoulder dated 06/03/2014 FINDINGS: Left shoulder arthroplasty in satisfactory position. Distal left clavicle resection. No fracture or dislocation is seen. Visualized soft tissues are within normal limits. Increased interstitial markings in the visualized left lung. IMPRESSION: Left shoulder arthroplasty in satisfactory position. Electronically Signed   By: Julian Hy M.D.   On: 02/14/2015 12:23    Disposition: 01-Home or Self Care    Follow-up Information    Follow up with [name], MD. Schedule an appointment as soon as possible for a visit in 1 week.   Specialty:  Orthopedic Surgery   Contact information:   [place] [id number] [phone]        Signed: Fannie Knee 02/15/2015, 7:22 AM

## 2015-02-14 NOTE — Anesthesia Procedure Notes (Addendum)
Anesthesia Regional Block:  Interscalene brachial plexus block  Pre-Anesthetic Checklist: ,, timeout performed, Correct Patient, Correct Site, Correct Laterality, Correct Procedure, Correct Position, site marked, Risks and benefits discussed,  Surgical consent,  Pre-op evaluation,  At surgeon's request and post-op pain management  Laterality: Left  Prep: chloraprep       Needles:  Injection technique: Single-shot  Needle Type: Echogenic Needle     Needle Length: 9cm 9 cm Needle Gauge: 21 and 21 G    Additional Needles:  Procedures: ultrasound guided (picture in chart) Interscalene brachial plexus block Narrative:  Injection made incrementally with aspirations every 5 mL.  Performed by: Personally  Anesthesiologist: ROSE, Iona Beard  Additional Notes: Patient tolerated the procedure well without complications   Procedure Name: Intubation Date/Time: 02/14/2015 8:42 AM Performed by: Rejeana Brock L Pre-anesthesia Checklist: Patient identified, Emergency Drugs available, Suction available, Patient being monitored and Timeout performed Patient Re-evaluated:Patient Re-evaluated prior to inductionOxygen Delivery Method: Circle system utilized Preoxygenation: Pre-oxygenation with 100% oxygen Intubation Type: IV induction Ventilation: Mask ventilation without difficulty Laryngoscope Size: Mac and 3 Grade View: Grade II Tube type: Oral Tube size: 7.0 mm Number of attempts: 1 Airway Equipment and Method: Stylet Placement Confirmation: ETT inserted through vocal cords under direct vision,  positive ETCO2 and breath sounds checked- equal and bilateral Secured at: 21 cm Tube secured with: Tape Dental Injury: Teeth and Oropharynx as per pre-operative assessment

## 2015-02-15 ENCOUNTER — Encounter (HOSPITAL_COMMUNITY): Payer: Self-pay | Admitting: Orthopedic Surgery

## 2015-02-15 LAB — BASIC METABOLIC PANEL
ANION GAP: 9 (ref 5–15)
BUN: 11 mg/dL (ref 6–20)
CHLORIDE: 104 mmol/L (ref 101–111)
CO2: 22 mmol/L (ref 22–32)
Calcium: 8.8 mg/dL — ABNORMAL LOW (ref 8.9–10.3)
Creatinine, Ser: 0.77 mg/dL (ref 0.44–1.00)
GFR calc non Af Amer: >60 mL/min (ref >60)
Glucose, Bld: 132 mg/dL — ABNORMAL HIGH (ref 65–99)
POTASSIUM: 3.6 mmol/L (ref 3.5–5.1)
Sodium: 135 mmol/L (ref 135–145)

## 2015-02-15 LAB — CBC
HEMATOCRIT: 34.8 % — AB (ref 36.0–46.0)
HEMOGLOBIN: 11.4 g/dL — AB (ref 12.0–15.0)
MCH: 29.2 pg (ref 26.0–34.0)
MCHC: 32.8 g/dL (ref 30.0–36.0)
MCV: 89 fL (ref 78.0–100.0)
Platelets: 229 10*3/uL (ref 150–400)
RBC: 3.91 MIL/uL (ref 3.87–5.11)
RDW: 12.8 % (ref 11.5–15.5)
WBC: 12.2 10*3/uL — ABNORMAL HIGH (ref 4.0–10.5)

## 2015-02-15 LAB — GLUCOSE, CAPILLARY
GLUCOSE-CAPILLARY: 133 mg/dL — AB (ref 65–99)
Glucose-Capillary: 118 mg/dL — ABNORMAL HIGH (ref 65–99)

## 2015-02-15 NOTE — Progress Notes (Signed)
Discussed discharge summary with patient. Reviewed all medications with patient. Patient received Rx. Patient ready for discharge. 

## 2015-02-15 NOTE — Progress Notes (Signed)
Subjective: 1 Day Post-Op Procedure(s) (LRB): LEFT TOTAL SHOULDER ARTHROPLASTY (Left) RIGHT SHOULDER CORTISONE INJECTION (Right) Patient reports pain as mild.  No nausea/vomiting, lightheadedness/dizziness, chest pain/sob.  Tolerating diet.  Objective: Vital signs in last 24 hours: Temp:  [97.3 F (36.3 C)-98.9 F (37.2 C)] 98.1 F (36.7 C) (12/08 0420) Pulse Rate:  [54-84] 84 (12/08 0420) Resp:  [14-18] 18 (12/08 0420) BP: (76-171)/(38-93) 123/56 mmHg (12/08 0420) SpO2:  [93 %-97 %] 94 % (12/08 0420) Weight:  [72 kg (158 lb 11.7 oz)] 72 kg (158 lb 11.7 oz) (12/07 1310)  Intake/Output from previous day: 12/07 0701 - 12/08 0700 In: 2575 [P.O.:500; I.V.:2075] Out: 700 [Urine:600; Blood:100] Intake/Output this shift:     Recent Labs  02/15/15 0430  HGB 11.4*    Recent Labs  02/15/15 0430  WBC 12.2*  RBC 3.91  HCT 34.8*  PLT 229    Recent Labs  02/15/15 0430  NA 135  K 3.6  CL 104  CO2 22  BUN 11  CREATININE 0.77  GLUCOSE 132*  CALCIUM 8.8*   No results for input(s): LABPT, INR in the last 72 hours.  Neurologically intact Neurovascular intact Sensation intact distally Intact pulses distally Dorsiflexion/Plantar flexion intact Compartment soft  Assessment/Plan: 1 Day Post-Op Procedure(s) (LRB): LEFT TOTAL SHOULDER ARTHROPLASTY (Left) RIGHT SHOULDER CORTISONE INJECTION (Right) Advance diet  D/C home today Dry dressing change prn Sling at all times LUE  Fannie Knee 02/15/2015, 7:21 AM

## 2015-02-15 NOTE — Op Note (Signed)
NAMEAURELIANA, Tina Frank NO.:  [id number]  MEDICAL RECORD NO.:  [id number]  LOCATION:  [id number]                        FACILITY:  [facility]  PHYSICIAN:  [name], M.D. DATE OF BIRTH:  1940-07-10  DATE OF PROCEDURE:  02/14/2015 DATE OF DISCHARGE:                              OPERATIVE REPORT   PREOPERATIVE DIAGNOSES:  Left shoulder end-stage arthritis, primary generalized.  Also approaching end-stage arthritis, primary generalized, right shoulder.  PREOPERATIVE DIAGNOSES:  Left shoulder end-stage arthritis, primary generalized.  Also approaching end-stage arthritis, primary generalized, right shoulder.  PROCEDURE:  Left shoulder total shoulder replacement utilizing Stryker prosthesis.  A 44-mm cemented pegged glenoid component.  Press-fit 12-mm stem with a 48 x 18 metallic head, eccentric.  Injection intra-articularly, right shoulder with Depo-Medrol and Marcaine.  SURGEON:  [name], M.D.  ASSISTANT:  Elmyra Ricks, PA, present throughout the entire case and necessary for timely completion of procedure.  ANESTHESIA:  General.  BLOOD LOSS:  Minimal.  SPECIMENS:  None.  CULTURES:  None.  COMPLICATIONS:  None.  DRESSINGS:  Soft compressive shoulder immobilizer on the left.  DESCRIPTION OF PROCEDURE:  The patient was brought to the operating room and after adequate anesthesia had been obtained, right shoulder was injected intra-articularly with Depo-Medrol and Marcaine under sterile technique.  Attention turned to the left and placed in the beach-chair position.  Shoulder positioner, prepped and draped in usual sterile fashion.  An incision along the deltopectoral interval.  Skin and subcutaneous tissue divided.  Hemostasis with cautery.  Deltopectoral interval opened and retractor put in place.  Rotator cuff intact.  The subscap taken down, retracted medially along the conjoined tendon. Humeral head cut at the surgical neck, 30 degrees  of retroversion.  All spurs removed.  Glenoid exposed.  Brought up to good bleeding bone. Drilled, sized, and fitted for a 44-mm component, which was firmly cemented in place.  The humerus was then reamed.  After appropriate trials, a #12 stem was placed at 30 degrees of retroversion.  I utilized the 48 x 18 head, eccentric, so I covered the humerus appropriately. Once that was in place, full motion with stable shoulder.  Wound irrigated.  Subscap repaired with FiberWire, incorporating into the biceps tendon in the bicipital groove to help the repair.  Wound irrigated. Retractors removed.  Subcutaneous and subcuticular closure.  Margins were injected with Marcaine.  Sterile compressive dressing applied. Shoulder immobilizer applied.  Anesthesia reversed.  Brought to the recovery room.  Tolerated the surgery well.  No complications.     [name], M.D.     DFM/MEDQ  D:  02/14/2015  T:  02/15/2015  Job:  [id number]

## 2015-02-15 NOTE — Anesthesia Postprocedure Evaluation (Signed)
Anesthesia Post Note  Patient: Tina Frank  Procedure(s) Performed: Procedure(s) (LRB): LEFT TOTAL SHOULDER ARTHROPLASTY (Left) RIGHT SHOULDER CORTISONE INJECTION (Right)  Patient location during evaluation: PACU Anesthesia Type: General and Regional Level of consciousness: awake and alert Pain management: pain level controlled Vital Signs Assessment: post-procedure vital signs reviewed and stable Respiratory status: spontaneous breathing and respiratory function stable Cardiovascular status: blood pressure returned to baseline and stable Postop Assessment: no signs of nausea or vomiting Anesthetic complications: no    Last Vitals:  Filed Vitals:   02/15/15 0420 02/15/15 0955  BP: 123/56 102/51  Pulse: 84   Temp: 36.7 C   Resp: 18     Last Pain:  Filed Vitals:   02/15/15 1033  PainSc: 5                  Easton Sivertson S

## 2015-02-15 NOTE — Evaluation (Signed)
Occupational Therapy Evaluation Patient Details Name: Tina Frank MRN: [id number] DOB: [birth year] Today's Date: 02/15/2015    History of Present Illness Pt is a 74 y.o. female s/p LEFT TOTAL SHOULDER ARTHROPLASTY (Left), RIGHT SHOULDER CORTISONE INJECTION (Right).      Clinical Impression   Pt reports she was independent with ADLs and mobility PTA. Currently pt is overall supervision for ADLs and functional mobility with the exception of min assist for UB ADLs. Began ADL, shoulder, and safety education with pt and son; they verbalize understanding. Pt would benefit from continued skilled OT in order to maximize independence and safety with UB/LB ADLs.     Follow Up Recommendations  Other (comment) (follow up per MD)    Equipment Recommendations  None recommended by OT    Recommendations for Other Services       Precautions / Restrictions Precautions Precautions: Shoulder;Fall Type of Shoulder Precautions: Conservative protocol: NO shoulder ROM. NO AROM elbow, wrist, hand.  Shoulder Interventions: Shoulder sling/immobilizer;At all times Precaution Booklet Issued: Yes (comment) Precaution Comments: Reviewed precautions Required Braces or Orthoses: Sling Restrictions Weight Bearing Restrictions: Yes LUE Weight Bearing: Non weight bearing      Mobility Bed Mobility Overal bed mobility: Needs Assistance Bed Mobility: Supine to Sit     Supine to sit: Supervision;HOB elevated     General bed mobility comments: Supervision for safety. Good technique. Pt able to maintain NWB on LUE throughout.  Transfers Overall transfer level: Needs assistance Equipment used: None Transfers: Sit to/from Stand Sit to Stand: Supervision         General transfer comment: Supervision for safety. Good hand placement and technique. Sit to stand from EOB x 1, BSC x 1    Balance Overall balance assessment: No apparent balance deficits (not formally assessed)                                           ADL Overall ADL's : Needs assistance/impaired Eating/Feeding: Set up;Sitting   Grooming: Supervision/safety;Standing;Wash/dry hands   Upper Body Bathing: Minimal assitance;Sitting;Adhering to UE precautions Upper Body Bathing Details (indicate cue type and reason): Educated on UB bathing technique and use of sling in shower to support LE; pt verbalized understanding. Lower Body Bathing: Supervison/ safety;Sit to/from stand   Upper Body Dressing : Minimal assistance;Sitting;Adhering to UE precautions Upper Body Dressing Details (indicate cue type and reason): Educated on UB dressing technique. Pt reports that she may just wear a shirt on the outside of her sling for awhile so she does not have to don/doff over LUE. Lower Body Dressing: Supervision/safety;Sit to/from stand   Toilet Transfer: Supervision/safety;Ambulation;BSC Toilet Transfer Details (indicate cue type and reason): Pt reports she has BSC set up over commode at home already. Toileting- Clothing Manipulation and Hygiene: Supervision/safety;Sit to/from stand (for toilet hygiene)   Tub/ Shower Transfer: Supervision/safety;Ambulation;Tub bench Tub/Shower Transfer Details (indicate cue type and reason): Educated on need for supervision during shower transfers initially upon return home. Pt reports she has a tub bench that is already set up that she plans to use when she is cleared to shower. Functional mobility during ADLs: Supervision/safety General ADL Comments: Pts son present for OT eval. Educated pt and son on sling management and wear schedule, positioning of LUE in bed, precautions, and edema management techniques; pt and son verbalize understanding. Pt reports she is planning on sleeping in a recliner chair at home  until she feels comfortable getting in and out of bed. Pt with minor dizziness with ambulation to bathroom; pt reports that she has been on a liquid diet and has not eaten much. RN  notified of dizziness.     Vision     Perception     Praxis      Pertinent Vitals/Pain Pain Assessment: 0-10 Pain Score: 2  Pain Location: L shoulder  Pain Descriptors / Indicators: Sore Pain Intervention(s): Limited activity within patient's tolerance;Monitored during session;Repositioned;Ice applied     Hand Dominance Right   Extremity/Trunk Assessment Upper Extremity Assessment Upper Extremity Assessment: LUE deficits/detail LUE Deficits / Details: Finger ROM WFL. Unable to assess LUE further due to immobilization. LUE: Unable to fully assess due to immobilization   Lower Extremity Assessment Lower Extremity Assessment: Overall WFL for tasks assessed   Cervical / Trunk Assessment Cervical / Trunk Assessment: Normal   Communication Communication Communication: No difficulties   Cognition Arousal/Alertness: Awake/alert Behavior During Therapy: WFL for tasks assessed/performed Overall Cognitive Status: Within Functional Limits for tasks assessed                     General Comments       Exercises Exercises: Shoulder     Shoulder Instructions Shoulder Instructions Donning/doffing shirt without moving shoulder: Minimal assistance;Caregiver independent with task;Patient able to independently direct caregiver (education completed) Method for sponge bathing under operated UE: Minimal assistance;Caregiver independent with task;Patient able to independently direct caregiver (education completed) Donning/doffing sling/immobilizer: Minimal assistance;Caregiver independent with task;Patient able to independently direct caregiver (education completed) Correct positioning of sling/immobilizer: Supervision/safety;Caregiver independent with task;Patient able to independently direct caregiver (education completed) Sling wearing schedule (on at all times/off for ADL's): Modified independent (education completed) Proper positioning of operated UE when showering: Minimal  assistance;Caregiver independent with task;Patient able to independently direct caregiver (education completed) Positioning of UE while sleeping: Minimal assistance;Caregiver independent with task;Patient able to independently direct caregiver (education completed)    Home Living Family/patient expects to be discharged to:: Private residence Living Arrangements: Alone Available Help at Discharge: Family (Available 24/7 for first 2 days then intermittent) Type of Home: House Home Access: Stairs to enter [employer] of Steps: 3 Entrance Stairs-Rails: Right;Left Home Layout: One level     Bathroom Shower/Tub: Teacher, early years/pre: Standard Bathroom Accessibility: Yes   Home Equipment: Environmental consultant - 2 wheels;Cane - single point;Tub bench;Grab bars - tub/shower;Hand held shower head;Bedside commode          Prior Functioning/Environment Level of Independence: Independent             OT Diagnosis: Acute pain   OT Problem List: Decreased knowledge of precautions;Impaired UE functional use;Pain   OT Treatment/Interventions:      OT Goals(Current goals can be found in the care plan section) Acute Rehab OT Goals Patient Stated Goal: to go home later today  OT Goal Formulation: With patient Time For Goal Achievement: 03/01/15 Potential to Achieve Goals: Good ADL Goals Pt Will Perform Upper Body Bathing: with modified independence;sitting Pt Will Perform Lower Body Bathing: with modified independence;sit to/from stand Pt Will Perform Upper Body Dressing: with modified independence;sitting Pt Will Perform Lower Body Dressing: with modified independence;sit to/from stand  OT Frequency:     Barriers to D/C:            Co-evaluation              End of Session Equipment Utilized During Treatment: Gait belt;Other (comment) (sling) Nurse  Communication: Mobility status;Other (comment) (pt with dizziness during functional mobility )  Activity  Tolerance: Patient tolerated treatment well Patient left: in chair;with call bell/phone within reach;with nursing/sitter in room;with family/visitor present   Time: 0822-0846 OT Time Calculation (min): 24 min Charges:  OT General Charges $OT Visit: 1 Procedure OT Evaluation $Initial OT Evaluation Tier I: 1 Procedure OT Treatments $Self Care/Home Management : 8-22 mins G-Codes:     Binnie Kand M.S., OTR/L Pager: [phone]  02/15/2015, 9:06 AM

## 2015-03-01 ENCOUNTER — Ambulatory Visit: Payer: Medicare Other | Admitting: Podiatry

## 2015-03-26 ENCOUNTER — Other Ambulatory Visit: Payer: Medicare Other

## 2015-03-29 ENCOUNTER — Ambulatory Visit
Admission: RE | Admit: 2015-03-29 | Discharge: 2015-03-29 | Disposition: A | Discharge status: Home / Self Care | Payer: Medicare Other | Source: Ambulatory Visit | Attending: Obstetrics and Gynecology | Admitting: Obstetrics and Gynecology

## 2015-03-29 DIAGNOSIS — N6310 Unspecified lump in the right breast, unspecified quadrant: Secondary | ICD-10-CM

## 2015-04-11 ENCOUNTER — Ambulatory Visit (INDEPENDENT_AMBULATORY_CARE_PROVIDER_SITE_OTHER): Payer: Medicare Other | Admitting: Podiatry

## 2015-04-11 ENCOUNTER — Encounter: Payer: Self-pay | Admitting: Podiatry

## 2015-04-11 DIAGNOSIS — M79676 Pain in unspecified toe(s): Secondary | ICD-10-CM

## 2015-04-11 DIAGNOSIS — B351 Tinea unguium: Secondary | ICD-10-CM | POA: Diagnosis not present

## 2015-04-11 NOTE — Progress Notes (Signed)
   Subjective:    Patient ID: Tina Frank, female    DOB: 23-Feb-1941, 75 y.o.   MRN: [id number]  HPI  Patient presents here today for B/L toenail trim. Patient has thick disfigured discolored nails both feet.  Nails are painful walking and wearing her shoes.  She presents for preventive foot care services. Review of Systems  All other systems reviewed and are negative.      Objective:   Physical Exam GENERAL APPEARANCE: Alert, conversant. Appropriately groomed. No acute distress.  VASCULAR: Pedal pulses palpable at  [facility] and PT bilateral.  Capillary refill time is immediate to all digits,  Normal temperature gradient.  Digital hair growth is present bilateral  NEUROLOGIC: sensation is normal to 5.07 monofilament at 5/5 sites bilateral.  Light touch is intact bilateral, Muscle strength normal.  MUSCULOSKELETAL: acceptable muscle strength, tone and stability bilateral.  Intrinsic muscluature intact bilateral.  Rectus appearance of foot and digits noted bilateral.   DERMATOLOGIC: skin color, texture, and turgor are within normal limits.  No preulcerative lesions or ulcers  are seen, no interdigital maceration noted.  No open lesions present.  . No drainage noted.  NAILS  Thick disfigured discolored nails both feet.       Assessment & Plan:  Onychomycosis  Debridement of onychomycosis  RTC 9 weeks   Gardiner Barefoot DPM

## 2015-06-21 ENCOUNTER — Ambulatory Visit (INDEPENDENT_AMBULATORY_CARE_PROVIDER_SITE_OTHER): Payer: Medicare Other | Admitting: Podiatry

## 2015-06-21 ENCOUNTER — Encounter: Payer: Self-pay | Admitting: Podiatry

## 2015-06-21 DIAGNOSIS — B351 Tinea unguium: Secondary | ICD-10-CM

## 2015-06-21 DIAGNOSIS — M79676 Pain in unspecified toe(s): Secondary | ICD-10-CM | POA: Diagnosis not present

## 2015-06-21 NOTE — Progress Notes (Signed)
Patient ID: NINI ROUDEBUSH, female   DOB: 07-07-1940, 75 y.o.   MRN: [id number] Complaint:  Visit Type: Patient returns to my office for continued preventative foot care services. Complaint: Patient states" my nails have grown long and thick and become painful to walk and wear shoes" Patient has been diagnosed with DM with no foot complications. The patient presents for preventative foot care services. No changes to ROS  Podiatric Exam: Vascular: dorsalis pedis and posterior tibial pulses are palpable bilateral. Capillary return is immediate. Temperature gradient is WNL. Skin turgor WNL  Sensorium: Normal Semmes Weinstein monofilament test. Normal tactile sensation bilaterally. Nail Exam: Pt has thick disfigured discolored nails with subungual debris noted bilateral entire nail hallux through fifth toenails Ulcer Exam: There is no evidence of ulcer or pre-ulcerative changes or infection. Orthopedic Exam: Muscle tone and strength are WNL. No limitations in general ROM. No crepitus or effusions noted. Foot type and digits show no abnormalities. Bony prominences are unremarkable. Skin: No Porokeratosis. No infection or ulcers  Diagnosis:  Onychomycosis, , Pain in right toe, pain in left toes  Treatment & Plan Procedures and Treatment: Consent by patient was obtained for treatment procedures. The patient understood the discussion of treatment and procedures well. All questions were answered thoroughly reviewed. Debridement of mycotic and hypertrophic toenails, 1 through 5 bilateral and clearing of subungual debris. No ulceration, no infection noted.  Return Visit-Office Procedure: Patient instructed to return to the office for a follow up visit 9 weeks  for continued evaluation and treatment.    Gardiner Barefoot DPM

## 2015-08-23 ENCOUNTER — Encounter: Payer: Self-pay | Admitting: Podiatry

## 2015-08-23 ENCOUNTER — Ambulatory Visit (INDEPENDENT_AMBULATORY_CARE_PROVIDER_SITE_OTHER): Payer: Medicare Other | Admitting: Podiatry

## 2015-08-23 ENCOUNTER — Ambulatory Visit: Payer: Medicare Other | Admitting: Podiatry

## 2015-08-23 DIAGNOSIS — B351 Tinea unguium: Secondary | ICD-10-CM | POA: Diagnosis not present

## 2015-08-23 DIAGNOSIS — M79676 Pain in unspecified toe(s): Secondary | ICD-10-CM | POA: Diagnosis not present

## 2015-08-23 NOTE — Progress Notes (Signed)
Patient ID: Tina Frank, female   DOB: 07-07-1940, 75 y.o.   MRN: [id number] Complaint:  Visit Type: Patient returns to my office for continued preventative foot care services. Complaint: Patient states" my nails have grown long and thick and become painful to walk and wear shoes" Patient has been diagnosed with DM with no foot complications. The patient presents for preventative foot care services. No changes to ROS  Podiatric Exam: Vascular: dorsalis pedis and posterior tibial pulses are palpable bilateral. Capillary return is immediate. Temperature gradient is WNL. Skin turgor WNL  Sensorium: Normal Semmes Weinstein monofilament test. Normal tactile sensation bilaterally. Nail Exam: Pt has thick disfigured discolored nails with subungual debris noted bilateral entire nail hallux through fifth toenails Ulcer Exam: There is no evidence of ulcer or pre-ulcerative changes or infection. Orthopedic Exam: Muscle tone and strength are WNL. No limitations in general ROM. No crepitus or effusions noted. Foot type and digits show no abnormalities. Bony prominences are unremarkable. Skin: No Porokeratosis. No infection or ulcers  Diagnosis:  Onychomycosis, , Pain in right toe, pain in left toes  Treatment & Plan Procedures and Treatment: Consent by patient was obtained for treatment procedures. The patient understood the discussion of treatment and procedures well. All questions were answered thoroughly reviewed. Debridement of mycotic and hypertrophic toenails, 1 through 5 bilateral and clearing of subungual debris. No ulceration, no infection noted.  Return Visit-Office Procedure: Patient instructed to return to the office for a follow up visit 9 weeks  for continued evaluation and treatment.    Gardiner Barefoot DPM

## 2015-10-23 ENCOUNTER — Other Ambulatory Visit: Payer: Self-pay | Admitting: Obstetrics and Gynecology

## 2015-10-23 DIAGNOSIS — Z1231 Encounter for screening mammogram for malignant neoplasm of breast: Secondary | ICD-10-CM

## 2015-10-25 ENCOUNTER — Encounter: Payer: Self-pay | Admitting: Podiatry

## 2015-10-25 ENCOUNTER — Ambulatory Visit (INDEPENDENT_AMBULATORY_CARE_PROVIDER_SITE_OTHER): Payer: Medicare Other | Admitting: Podiatry

## 2015-10-25 DIAGNOSIS — M79676 Pain in unspecified toe(s): Secondary | ICD-10-CM | POA: Diagnosis not present

## 2015-10-25 DIAGNOSIS — B351 Tinea unguium: Secondary | ICD-10-CM

## 2015-10-25 NOTE — Progress Notes (Signed)
Patient ID: [name], female   DOB: 1940-05-10, 75 y.o.   MRN: [id number] Complaint:  Visit Type: Patient returns to my office for continued preventative foot care services. Complaint: Patient states" my nails have grown long and thick and become painful to walk and wear shoes" Patient has been diagnosed with DM with no foot complications. The patient presents for preventative foot care services. No changes to ROS  Podiatric Exam: Vascular: dorsalis pedis and posterior tibial pulses are palpable bilateral. Capillary return is immediate. Temperature gradient is WNL. Skin turgor WNL  Sensorium: Normal Semmes Weinstein monofilament test. Normal tactile sensation bilaterally. Nail Exam: Pt has thick disfigured discolored nails with subungual debris noted bilateral entire nail hallux through fifth toenails Ulcer Exam: There is no evidence of ulcer or pre-ulcerative changes or infection. Orthopedic Exam: Muscle tone and strength are WNL. No limitations in general ROM. No crepitus or effusions noted. Foot type and digits show no abnormalities. Bony prominences are unremarkable. Skin: No Porokeratosis. No infection or ulcers  Diagnosis:  Onychomycosis, , Pain in right toe, pain in left toes  Treatment & Plan Procedures and Treatment: Consent by patient was obtained for treatment procedures. The patient understood the discussion of treatment and procedures well. All questions were answered thoroughly reviewed. Debridement of mycotic and hypertrophic toenails, 1 through 5 bilateral and clearing of subungual debris. No ulceration, no infection noted.  Return Visit-Office Procedure: Patient instructed to return to the office for a follow up visit 9 weeks  for continued evaluation and treatment.    Gardiner Barefoot DPM

## 2015-11-14 ENCOUNTER — Ambulatory Visit
Admission: RE | Admit: 2015-11-14 | Discharge: 2015-11-14 | Disposition: A | Discharge status: Home / Self Care | Payer: Medicare Other | Source: Ambulatory Visit | Attending: Obstetrics and Gynecology | Admitting: Obstetrics and Gynecology

## 2015-11-14 DIAGNOSIS — Z1231 Encounter for screening mammogram for malignant neoplasm of breast: Secondary | ICD-10-CM

## 2015-12-27 ENCOUNTER — Encounter: Payer: Self-pay | Admitting: Podiatry

## 2015-12-27 ENCOUNTER — Ambulatory Visit (INDEPENDENT_AMBULATORY_CARE_PROVIDER_SITE_OTHER): Payer: Medicare Other | Admitting: Podiatry

## 2015-12-27 VITALS — BP 120/67 | HR 69 | Resp 14

## 2015-12-27 DIAGNOSIS — B351 Tinea unguium: Secondary | ICD-10-CM | POA: Diagnosis not present

## 2015-12-27 DIAGNOSIS — M79676 Pain in unspecified toe(s): Secondary | ICD-10-CM

## 2015-12-27 NOTE — Progress Notes (Signed)
Patient ID: Tina Frank, female   DOB: 07/16/1940, 75 y.o.   MRN: [id number] Complaint:  Visit Type: Patient returns to my office for continued preventative foot care services. Complaint: Patient states" my nails have grown long and thick and become painful to walk and wear shoes" Patient has been diagnosed with DM with no foot complications. The patient presents for preventative foot care services. No changes to ROS  Podiatric Exam: Vascular: dorsalis pedis and posterior tibial pulses are palpable bilateral. Capillary return is immediate. Temperature gradient is WNL. Skin turgor WNL  Sensorium: Normal Semmes Weinstein monofilament test. Normal tactile sensation bilaterally. Nail Exam: Pt has thick disfigured discolored nails with subungual debris noted bilateral entire nail hallux through fifth toenails Ulcer Exam: There is no evidence of ulcer or pre-ulcerative changes or infection. Orthopedic Exam: Muscle tone and strength are WNL. No limitations in general ROM. No crepitus or effusions noted. Foot type and digits show no abnormalities. Bony prominences are unremarkable. Skin: No Porokeratosis. No infection or ulcers  Diagnosis:  Onychomycosis, , Pain in right toe, pain in left toes  Treatment & Plan Procedures and Treatment: Consent by patient was obtained for treatment procedures. The patient understood the discussion of treatment and procedures well. All questions were answered thoroughly reviewed. Debridement of mycotic and hypertrophic toenails, 1 through 5 bilateral and clearing of subungual debris. No ulceration, no infection noted.  Return Visit-Office Procedure: Patient instructed to return to the office for a follow up visit 9 weeks  for continued evaluation and treatment.    Aliza Moret DPM 

## 2016-03-05 ENCOUNTER — Encounter: Payer: Self-pay | Admitting: Podiatry

## 2016-03-05 ENCOUNTER — Ambulatory Visit (INDEPENDENT_AMBULATORY_CARE_PROVIDER_SITE_OTHER): Payer: Medicare Other | Admitting: Podiatry

## 2016-03-05 DIAGNOSIS — B351 Tinea unguium: Secondary | ICD-10-CM

## 2016-03-05 DIAGNOSIS — M79676 Pain in unspecified toe(s): Secondary | ICD-10-CM | POA: Diagnosis not present

## 2016-03-05 DIAGNOSIS — E119 Type 2 diabetes mellitus without complications: Secondary | ICD-10-CM

## 2016-03-05 NOTE — Progress Notes (Signed)
Patient ID: Tina Frank, female   DOB: 07/07/1940, 75 y.o.   MRN: [id number] Complaint:  Visit Type: Patient returns to my office for continued preventative foot care services. Complaint: Patient states" my nails have grown long and thick and become painful to walk and wear shoes" Patient has been diagnosed with DM with no foot complications. The patient presents for preventative foot care services. No changes to ROS  Podiatric Exam: Vascular: dorsalis pedis and posterior tibial pulses are palpable bilateral. Capillary return is immediate. Temperature gradient is WNL. Skin turgor WNL  Sensorium: Normal Semmes Weinstein monofilament test. Normal tactile sensation bilaterally. Nail Exam: Pt has thick disfigured discolored nails with subungual debris noted bilateral entire nail hallux through fifth toenails Ulcer Exam: There is no evidence of ulcer or pre-ulcerative changes or infection. Orthopedic Exam: Muscle tone and strength are WNL. No limitations in general ROM. No crepitus or effusions noted. Foot type and digits show no abnormalities. Bony prominences are unremarkable. Skin: No Porokeratosis. No infection or ulcers  Diagnosis:  Onychomycosis, , Pain in right toe, pain in left toes  Treatment & Plan Procedures and Treatment: Consent by patient was obtained for treatment procedures. The patient understood the discussion of treatment and procedures well. All questions were answered thoroughly reviewed. Debridement of mycotic and hypertrophic toenails, 1 through 5 bilateral and clearing of subungual debris. No ulceration, no infection noted.  Return Visit-Office Procedure: Patient instructed to return to the office for a follow up visit 9 weeks  for continued evaluation and treatment.    Elanda Garmany DPM 

## 2016-05-07 ENCOUNTER — Ambulatory Visit: Payer: Medicare Other | Admitting: Podiatry

## 2016-05-14 ENCOUNTER — Ambulatory Visit (INDEPENDENT_AMBULATORY_CARE_PROVIDER_SITE_OTHER): Payer: Medicare Other | Admitting: Podiatry

## 2016-05-14 ENCOUNTER — Encounter: Payer: Self-pay | Admitting: Podiatry

## 2016-05-14 DIAGNOSIS — E119 Type 2 diabetes mellitus without complications: Secondary | ICD-10-CM

## 2016-05-14 DIAGNOSIS — B351 Tinea unguium: Secondary | ICD-10-CM | POA: Diagnosis not present

## 2016-05-14 DIAGNOSIS — M79676 Pain in unspecified toe(s): Secondary | ICD-10-CM | POA: Diagnosis not present

## 2016-05-14 NOTE — Progress Notes (Signed)
Patient ID: Tina Frank, female   DOB: [birth year], 76 y.o.   MRN: [id number] Complaint:  Visit Type: Patient returns to my office for continued preventative foot care services. Complaint: Patient states" my nails have grown long and thick and become painful to walk and wear shoes" Patient has been diagnosed with DM with no foot complications. The patient presents for preventative foot care services. No changes to ROS  Podiatric Exam: Vascular: dorsalis pedis and posterior tibial pulses are palpable bilateral. Capillary return is immediate. Temperature gradient is WNL. Skin turgor WNL  Sensorium: Normal Semmes Weinstein monofilament test. Normal tactile sensation bilaterally. Nail Exam: Pt has thick disfigured discolored nails with subungual debris noted bilateral entire nail hallux through fifth toenails Ulcer Exam: There is no evidence of ulcer or pre-ulcerative changes or infection. Orthopedic Exam: Muscle tone and strength are WNL. No limitations in general ROM. No crepitus or effusions noted. Foot type and digits show no abnormalities. Bony prominences are unremarkable. Skin: No Porokeratosis. No infection or ulcers  Diagnosis:  Onychomycosis, , Pain in right toe, pain in left toes  Treatment & Plan Procedures and Treatment: Consent by patient was obtained for treatment procedures. The patient understood the discussion of treatment and procedures well. All questions were answered thoroughly reviewed. Debridement of mycotic and hypertrophic toenails, 1 through 5 bilateral and clearing of subungual debris. No ulceration, no infection noted.  Return Visit-Office Procedure: Patient instructed to return to the office for a follow up visit 9 weeks  for continued evaluation and treatment.    Gardiner Barefoot DPM

## 2016-07-02 ENCOUNTER — Encounter: Payer: Self-pay | Admitting: Physical Therapy

## 2016-07-02 ENCOUNTER — Ambulatory Visit: Payer: Medicare Other | Attending: Internal Medicine | Admitting: Physical Therapy

## 2016-07-02 DIAGNOSIS — R293 Abnormal posture: Secondary | ICD-10-CM | POA: Insufficient documentation

## 2016-07-02 DIAGNOSIS — M6281 Muscle weakness (generalized): Secondary | ICD-10-CM | POA: Insufficient documentation

## 2016-07-03 ENCOUNTER — Encounter: Payer: Self-pay | Admitting: Physical Therapy

## 2016-07-03 NOTE — Therapy (Signed)
[place], [place], [address] Phone: [phone]   Fax:  [phone]  Physical Therapy Evaluation  Patient Details  Name: Tina Frank MRN: [id number] Date of Birth: April 30, 1940 Referring Provider: Dr Delrae Rend  Encounter Date: 07/02/2016      PT End of Session - 07/03/16 1540    Visit Number 1   Number of Visits 16   Date for PT Re-Evaluation 08/28/16   Authorization Type UHC MCR    PT Start Time 5956   PT Stop Time 1228   PT Time Calculation (min) 43 min   Activity Tolerance Patient tolerated treatment well   Behavior During Therapy [facility] for tasks assessed/performed      Past Medical History:  Diagnosis Date  . Arthritis    "probably in my shoulders and left hip" (02/14/2015)  . GERD (gastroesophageal reflux disease)    hx  . Hyperlipidemia   . Hypertension   . Obesity    is resolve- pt. highest weight was 180 10 yrs ago.  Marland Kitchen Reflux esophagitis   . Type II diabetes mellitus ([place])     Past Surgical History:  Procedure Laterality Date  . COLONOSCOPY    . EYE SURGERY     laser surgery for retinal tear.  Marland Kitchen JOINT REPLACEMENT    . PARATHYROIDECTOMY N/A 12/02/2012   Procedure: PARATHYROIDECTOMY with frozen section ;  Surgeon: Earnstine Regal, MD;  Location: [facility];  Service: General;  Laterality: N/A;  . SHOULDER INJECTION Left 07/27/2013   Procedure: SHOULDER INJECTION;  Surgeon: [name], MD;  Location: [place];  Service: Orthopedics;  Laterality: Left;  . STERIOD INJECTION Right 02/14/2015   Procedure: RIGHT SHOULDER CORTISONE INJECTION;  Surgeon: [name], MD;  Location: [place];  Service: Orthopedics;  Laterality: Right;  . TOTAL HIP ARTHROPLASTY Left 07/27/2013   Procedure: TOTAL HIP ARTHROPLASTY ANTERIOR APPROACH;  Surgeon: [name], MD;  Location: [place];  Service: Orthopedics;  Laterality: Left;  . TOTAL SHOULDER ARTHROPLASTY Left 02/14/2015  . TOTAL SHOULDER ARTHROPLASTY  Left 02/14/2015   Procedure: LEFT TOTAL SHOULDER ARTHROPLASTY;  Surgeon: [name], MD;  Location: [place];  Service: Orthopedics;  Laterality: Left;    There were no vitals filed for this visit.       Subjective Assessment - 07/03/16 1535    Subjective Patient comes to therapy for a program for osteoperosis. She was recently diagnosed with osteoperosis after several years of having osteopenia. She has severe arthritis in her right shoulder after having a left shoulder replacement. She has also had a left hip replacement. She is acitve and likes to walk for exercise.    Pertinent History osteoperosis, left shoulder replacement , right shoulder arthritis    Patient Stated Goals to have an exercise program to kep her strong    Currently in Pain? Yes   Pain Score 3    Pain Location Shoulder   Pain Orientation Right   Pain Descriptors / Indicators Aching   Pain Type Chronic pain   Pain Onset More than a month ago   Pain Frequency Constant            [facility] PT Assessment - 07/03/16 0001      Assessment   Medical Diagnosis Osteoperosis    Referring Provider Dr Delrae Rend   Onset Date/Surgical Date --  went from osteopenia to osteoperosis    Hand Dominance Right   Next MD Visit 1 year  Prior Therapy for her left shoulder      Precautions   Precaution Comments osteoperosis      Restrictions   Weight Bearing Restrictions No     Balance Screen   Has the patient fallen in the past 6 months No   Has the patient had a decrease in activity level because of a fear of falling?  No   Is the patient reluctant to leave their home because of a fear of falling?  No     Home Ecologist residence   Additional Comments 3 steps getting into the house      Prior Function   Level of Independence Independent   Vocation Full time employment   Leisure walking outside      Cognition   Overall Cognitive Status Within Functional Limits for tasks assessed    Attention Focused   Focused Attention Appears intact   Memory Appears intact   Awareness Appears intact   Problem Solving Appears intact     Observation/Other Assessments   Observations rounded shoulder < on the right      Sensation   Additional Comments Denies parathesias      Coordination   Gross Motor Movements are Fluid and Coordinated Yes   Fine Motor Movements are Fluid and Coordinated Yes     AROM   Overall AROM Comments normal hip movement; Active right shoulder flexion to 85 degrees; right flexion120 degrees      PROM   Overall PROM Comments PROM to 130 on the right with pain at end range. Sever arthritis noted in the right shoulder      Strength   Strength Assessment Site Shoulder;Hip;Knee   Right/Left Shoulder Right;Left   Right Shoulder Extension 4/5   Right Shoulder External Rotation 4+/5   Right Shoulder Horizontal ABduction 4+/5   Left Shoulder Flexion 3/5   Left Shoulder Internal Rotation 3+/5   Left Shoulder External Rotation 3+/5   Right/Left Hip Right;Left   Right Hip Flexion 4+/5   Right Hip ABduction 4+/5   Left Hip Flexion 5/5   Left Hip ABduction 5/5     Palpation   Palpation comment tenderness to palpation in the right shoulder      Ambulation/Gait   Gait Comments decreased hip flexion bilateral                    [facility] Adult PT Treatment/Exercise - 07/03/16 0001      Lumbar Exercises: Supine   AB Set Limitations reviewed abdominal breathing    Clam Limitations 2x10    Heel Slides Limitations x10 bilateral    Bent Knee Raise Limitations x10 bilateral    Other Supine Lumbar Exercises ball squeeze 2x10     Knee/Hip Exercises: Seated   Long Arc Quad Limitations 2x10                PT Education - 07/03/16 1540    Education provided Yes   Education Details HEP, improtance of not increaseing her shoulder pain    Person(s) Educated Patient   Methods Explanation   Comprehension Verbalized understanding;Returned  demonstration          PT Short Term Goals - 07/03/16 1550      PT SHORT TERM GOAL #1   Title Patient will increase bilateral LE strength to 5/5    Time 4   Period Weeks   Status New     PT SHORT TERM GOAL #2  Title Patient will increase gorss bilateral upper extremity strength to 4/5   Time 4   Period Weeks   Status New     PT SHORT TERM GOAL #3   Title Patient will demsotrate a good core contraction    Time 4   Period Weeks   Status New           PT Long Term Goals - 07/03/16 1551      PT LONG TERM GOAL #1   Title Patient will verabilize an understanding of proper posture to protect her back with ADL's    Time 8   Period Weeks   Status New     PT LONG TERM GOAL #2   Title Patient will be independent with a complete workout program to protect her back, prevent falls in the fututre, and help improve bone density.    Time 8   Period Weeks   Status New               Plan - 07/03/16 1541    Clinical Impression Statement Patient is a 76 year old female with osteoperosis. She has arthritis of the right shoulder and a replacement of the left. she would benefit from a program that promotes strength and core stability to prevent falls and compression fractures. The patient is active. She will be given a toltal body exercise program with consideration given to her arthritic shoulder. She was seen for a low complexity evaluation.    Rehab Potential Good   PT Frequency 2x / week   PT Duration 8 weeks   PT Treatment/Interventions ADLs/Self Care Home Management;Cryotherapy;Electrical Stimulation;Iontophoresis 4mg /ml Dexamethasone;Therapeutic activities;Therapeutic exercise;Patient/family education;Passive range of motion;Manual techniques;Taping;Splinting   PT Next Visit Plan review core strengthening exercises; consider UE exercises but be carefull of the patients shoulders; add PPT, add ball press, consdier scap retraction; at some point add light standing exercises;  add SLR and bridging if able.    PT Home Exercise Plan supine march; supine ball squeeze; supine hip abduction, LAQ,    Consulted and Agree with Plan of Care Patient      Patient will benefit from skilled therapeutic intervention in order to improve the following deficits and impairments:  Pain, Decreased range of motion, Decreased activity tolerance, Decreased mobility, Decreased strength  Visit Diagnosis: Muscle weakness (generalized) - Plan: PT plan of care cert/re-cert  Abnormal posture - Plan: PT plan of care cert/re-cert      G-Codes - 73/41/93 1553    Functional Assessment Tool Used (Outpatient Only) clinical decision making    Functional Limitation Changing and maintaining body position   Changing and Maintaining Body Position Current Status (X9024) At least 1 percent but less than 20 percent impaired, limited or restricted   Changing and Maintaining Body Position Goal Status (O9735) At least 1 percent but less than 20 percent impaired, limited or restricted       Problem List Patient Active Problem List   Diagnosis Date Noted  . Degenerative joint disease of left shoulder 02/14/2015  . Pre-syncope 10/06/2013  . Leukocytosis 10/06/2013  . Hypotension 10/06/2013  . Primary localized osteoarthrosis, pelvic region and thigh 07/27/2013  . Hypotension (arterial) 10/28/2011  . HTN (hypertension) with goal to be determined 10/28/2011  . DM (diabetes mellitus) ([place]) 10/28/2011  . Hyperlipidemia 10/28/2011  . Obesity   . Reflux esophagitis   . Herpes zoster   . Shingles     Carney Living PT DPT  07/03/2016, 5:34 PM  [facility] [address] [place], [place], [address] Phone: [phone]   Fax:  [phone]  Name: MIRIA CAPPELLI MRN: [id number] Date of Birth: 07-Jun-1940

## 2016-07-10 ENCOUNTER — Encounter: Payer: Self-pay | Admitting: Physical Therapy

## 2016-07-10 ENCOUNTER — Ambulatory Visit: Payer: Medicare Other | Attending: Internal Medicine | Admitting: Physical Therapy

## 2016-07-10 DIAGNOSIS — M6281 Muscle weakness (generalized): Secondary | ICD-10-CM | POA: Insufficient documentation

## 2016-07-10 DIAGNOSIS — R293 Abnormal posture: Secondary | ICD-10-CM | POA: Diagnosis present

## 2016-07-10 NOTE — Therapy (Signed)
[place], [place], [address] Phone: [phone]   Fax:  [phone]  Physical Therapy Treatment  Patient Details  Name: Tina Frank MRN: [id number] Date of Birth: Jul 09, 1940 Referring Provider: Dr Delrae Rend  Encounter Date: 07/10/2016      PT End of Session - 07/10/16 1612    Visit Number 2   Number of Visits 16   Date for PT Re-Evaluation 08/28/16   Authorization Type UHC MCR    PT Start Time 1100   PT Stop Time 1143   PT Time Calculation (min) 43 min   Activity Tolerance Patient tolerated treatment well   Behavior During Therapy [facility] for tasks assessed/performed      Past Medical History:  Diagnosis Date  . Arthritis    "probably in my shoulders and left hip" (02/14/2015)  . GERD (gastroesophageal reflux disease)    hx  . Hyperlipidemia   . Hypertension   . Obesity    is resolve- pt. highest weight was 180 10 yrs ago.  Marland Kitchen Reflux esophagitis   . Type II diabetes mellitus ([place])     Past Surgical History:  Procedure Laterality Date  . COLONOSCOPY    . EYE SURGERY     laser surgery for retinal tear.  Marland Kitchen JOINT REPLACEMENT    . PARATHYROIDECTOMY N/A 12/02/2012   Procedure: PARATHYROIDECTOMY with frozen section ;  Surgeon: Earnstine Regal, MD;  [facility];  Service: General;  Laterality: N/A;  . SHOULDER INJECTION Left 07/27/2013   Procedure: SHOULDER INJECTION;  Surgeon: [name], MD;  Location: [place];  Service: Orthopedics;  Laterality: Left;  . STERIOD INJECTION Right 02/14/2015   Procedure: RIGHT SHOULDER CORTISONE INJECTION;  Surgeon: [name], MD;  Location: [place];  Service: Orthopedics;  Laterality: Right;  . TOTAL HIP ARTHROPLASTY Left 07/27/2013   Procedure: TOTAL HIP ARTHROPLASTY ANTERIOR APPROACH;  Surgeon: [name], MD;  Location: [place];  Service: Orthopedics;  Laterality: Left;  . TOTAL SHOULDER ARTHROPLASTY Left 02/14/2015  . TOTAL SHOULDER ARTHROPLASTY Left  02/14/2015   Procedure: LEFT TOTAL SHOULDER ARTHROPLASTY;  Surgeon: [name], MD;  Location: [place];  Service: Orthopedics;  Laterality: Left;    There were no vitals filed for this visit.                       [place] Adult PT Treatment/Exercise - 07/10/16 0001      Lumbar Exercises: Supine   Clam Limitations 2x10   Heel Slides Limitations x10 with abdominal breathing    Bent Knee Raise Limitations 2x10 with abdominal breathing    Other Supine Lumbar Exercises ball squeeze 2x10     Knee/Hip Exercises: Supine   Other Supine Knee/Hip Exercises supine lateral trunk rotation 2x10      Shoulder Exercises: Seated   Other Seated Exercises seated shoulder abduction in low range 2x10 yellow; seated ER yellow 2x10      Shoulder Exercises: Standing   Other Standing Exercises shoulder extnesion yellow 2x10; scpa retraction yellow 2x10                PT Education - 07/10/16 1603    Education provided Yes   Education Details updated HEP, extensive education on the improtance of symptom mangement in her shoulders    Person(s) Educated Patient   Methods Explanation   Comprehension Verbalized understanding;Returned demonstration          PT Short Term  Goals - 07/03/16 1550      PT SHORT TERM GOAL #1   Title Patient will increase bilateral LE strength to 5/5    Time 4   Period Weeks   Status New     PT SHORT TERM GOAL #2   Title Patient will increase gorss bilateral upper extremity strength to 4/5   Time 4   Period Weeks   Status New     PT SHORT TERM GOAL #3   Title Patient will demsotrate a good core contraction    Time 4   Period Weeks   Status New           PT Long Term Goals - 07/03/16 1551      PT LONG TERM GOAL #1   Title Patient will verabilize an understanding of proper posture to protect her back with ADL's    Time 8   Period Weeks   Status New     PT LONG TERM GOAL #2   Title Patient will be independent with a complete  workout program to protect her back, prevent falls in the fututre, and help improve bone density.    Time 8   Period Weeks   Status New               Plan - 07/10/16 1612    Clinical Impression Statement Patient tolerated treatment well. she had no increase in shoulder pain. She was advised if she doses later to hault exercises. Therapy iwll add standing exercises in next visit. Standing exercises given to help reduce chances of increase kyphosis.    Rehab Potential Good   PT Frequency 2x / week   PT Duration 8 weeks   PT Treatment/Interventions ADLs/Self Care Home Management;Cryotherapy;Electrical Stimulation;Iontophoresis 4mg /ml Dexamethasone;Therapeutic activities;Therapeutic exercise;Patient/family education;Passive range of motion;Manual techniques;Taping;Splinting   PT Next Visit Plan add standing exercises.    PT Home Exercise Plan supine march; supine ball squeeze; supine hip abduction, LAQ,    Consulted and Agree with Plan of Care Patient      Patient will benefit from skilled therapeutic intervention in order to improve the following deficits and impairments:  Pain, Decreased range of motion, Decreased activity tolerance, Decreased mobility, Decreased strength  Visit Diagnosis: Muscle weakness (generalized)  Abnormal posture     Problem List Patient Active Problem List   Diagnosis Date Noted  . Degenerative joint disease of left shoulder 02/14/2015  . Pre-syncope 10/06/2013  . Leukocytosis 10/06/2013  . Hypotension 10/06/2013  . Primary localized osteoarthrosis, pelvic region and thigh 07/27/2013  . Hypotension (arterial) 10/28/2011  . HTN (hypertension) with goal to be determined 10/28/2011  . DM (diabetes mellitus) ([place]) 10/28/2011  . Hyperlipidemia 10/28/2011  . Obesity   . Reflux esophagitis   . Herpes zoster   . Shingles     Carney Living PT DPT  07/10/2016, 4:16 PM  [facility] [address] [place], [place], [address] Phone: [phone]   Fax:  [phone]  Name: Tina Frank MRN: [id number] Date of Birth: 1940-05-14

## 2016-07-17 ENCOUNTER — Ambulatory Visit: Payer: Medicare Other | Admitting: Physical Therapy

## 2016-07-17 DIAGNOSIS — M6281 Muscle weakness (generalized): Secondary | ICD-10-CM | POA: Diagnosis not present

## 2016-07-17 DIAGNOSIS — R293 Abnormal posture: Secondary | ICD-10-CM

## 2016-07-17 NOTE — Therapy (Signed)
[place], [place], [address] Phone: [phone]   Fax:  [phone]  Physical Therapy Treatment  Patient Details  Name: Tina Frank MRN: [id number] Date of Birth: November 09, 1940 Referring Provider: Dr Delrae Rend  Encounter Date: 07/17/2016      PT End of Session - 07/17/16 1055    Visit Number 3   Number of Visits 16   Date for PT Re-Evaluation 08/28/16   Authorization Type UHC MCR    PT Start Time 1016   PT Stop Time 1054   PT Time Calculation (min) 38 min   Activity Tolerance Patient tolerated treatment well   Behavior During Therapy [facility] for tasks assessed/performed      Past Medical History:  Diagnosis Date  . Arthritis    "probably in my shoulders and left hip" (02/14/2015)  . GERD (gastroesophageal reflux disease)    hx  . Hyperlipidemia   . Hypertension   . Obesity    is resolve- pt. highest weight was 180 10 yrs ago.  Marland Kitchen Reflux esophagitis   . Type II diabetes mellitus ([place])     Past Surgical History:  Procedure Laterality Date  . COLONOSCOPY    . EYE SURGERY     laser surgery for retinal tear.  Marland Kitchen JOINT REPLACEMENT    . PARATHYROIDECTOMY N/A 12/02/2012   Procedure: PARATHYROIDECTOMY with frozen section ;  Surgeon: Earnstine Regal, MD;  [facility];  Service: General;  Laterality: N/A;  . SHOULDER INJECTION Left 07/27/2013   Procedure: SHOULDER INJECTION;  Surgeon: [name], MD;  Location: [place];  Service: Orthopedics;  Laterality: Left;  . STERIOD INJECTION Right 02/14/2015   Procedure: RIGHT SHOULDER CORTISONE INJECTION;  Surgeon: [name], MD;  Location: [place];  Service: Orthopedics;  Laterality: Right;  . TOTAL HIP ARTHROPLASTY Left 07/27/2013   Procedure: TOTAL HIP ARTHROPLASTY ANTERIOR APPROACH;  Surgeon: [name], MD;  Location: [place];  Service: Orthopedics;  Laterality: Left;  . TOTAL SHOULDER ARTHROPLASTY Left 02/14/2015  . TOTAL SHOULDER ARTHROPLASTY  Left 02/14/2015   Procedure: LEFT TOTAL SHOULDER ARTHROPLASTY;  Surgeon: [name], MD;  Location: [place];  Service: Orthopedics;  Laterality: Left;    There were no vitals filed for this visit.      Subjective Assessment - 07/17/16 1052    Subjective Patient reports her shoulder was a little sore after the ast visit. She has been trying her exrecises. She has no other complaints. She continues to walk outside when she can.    Pertinent History osteoperosis, left shoulder replacement , right shoulder arthritis    Patient Stated Goals to have an exercise program to kep her strong    Currently in Pain? Yes   Pain Score 3    Pain Location Shoulder   Pain Orientation Right   Pain Descriptors / Indicators Aching   Pain Type Chronic pain   Pain Onset More than a month ago   Pain Frequency Constant   Aggravating Factors  use og the shoulders    Pain Relieving Factors rest    Effect of Pain on Daily Activities difficult yperfroming ADL's                          [facility] Adult PT Treatment/Exercise - 07/17/16 0001      Lumbar Exercises: Supine   Clam Limitations 2x10   Heel Slides Limitations x10 with abdominal breathing  Bent Knee Raise Limitations 2x10 with abdominal breathing    Other Supine Lumbar Exercises ball squeeze 2x10     Knee/Hip Exercises: Standing   Heel Raises Limitations 2x10    Knee Flexion Limitations standing marching 2x10    Hip Flexion Limitations 2x10    Abduction Limitations 2x10    Extension Limitations 2x10      Knee/Hip Exercises: Seated   Long Arc Quad Limitations 2x10     Knee/Hip Exercises: Supine   Other Supine Knee/Hip Exercises supine lateral trunk rotation 2x10      Shoulder Exercises: Standing   Other Standing Exercises shoulder extnesion yellow 2x10; scpa retraction yellow 2x10                PT Education - 07/17/16 1054    Education provided Yes   Education Details continue with HEP, reviewed symptom  mangement; technique with shoulder exercises    Person(s) Educated Patient   Methods Explanation;Demonstration   Comprehension Returned demonstration;Verbalized understanding;Verbal cues required          PT Short Term Goals - 07/17/16 1543      PT SHORT TERM GOAL #1   Title Patient will increase bilateral LE strength to 5/5    Time 4   Period Weeks   Status On-going     PT SHORT TERM GOAL #2   Title Patient will increase gorss bilateral upper extremity strength to 4/5   Time 4   Period Weeks   Status On-going     PT SHORT TERM GOAL #3   Title Patient will demsotrate a good core contraction    Time 4   Period Weeks   Status On-going           PT Long Term Goals - 07/03/16 1551      PT LONG TERM GOAL #1   Title Patient will verabilize an understanding of proper posture to protect her back with ADL's    Time 8   Period Weeks   Status New     PT LONG TERM GOAL #2   Title Patient will be independent with a complete workout program to protect her back, prevent falls in the fututre, and help improve bone density.    Time 8   Period Weeks   Status New               Plan - 07/17/16 1055    Clinical Impression Statement Therapy corrected the patients technique with shoulder exrecises. She was pulling her shoulders too far back and likely putting stress on her shoulders. Bilateral ER was also removed so she is just working on postural exercises. She tolerated standing exercises well.    PT Frequency 2x / week   PT Duration 8 weeks   PT Treatment/Interventions ADLs/Self Care Home Management;Cryotherapy;Electrical Stimulation;Iontophoresis 4mg /ml Dexamethasone;Therapeutic activities;Therapeutic exercise;Patient/family education;Passive range of motion;Manual techniques;Taping;Splinting   PT Next Visit Plan add standing exercises.    PT Home Exercise Plan supine march; supine ball squeeze; supine hip abduction, LAQ,    Consulted and Agree with Plan of Care Patient       Patient will benefit from skilled therapeutic intervention in order to improve the following deficits and impairments:  Pain, Decreased range of motion, Decreased activity tolerance, Decreased mobility, Decreased strength  Visit Diagnosis: Muscle weakness (generalized)  Abnormal posture     Problem List Patient Active Problem List   Diagnosis Date Noted  . Degenerative joint disease of left shoulder 02/14/2015  . Pre-syncope 10/06/2013  .  Leukocytosis 10/06/2013  . Hypotension 10/06/2013  . Primary localized osteoarthrosis, pelvic region and thigh 07/27/2013  . Hypotension (arterial) 10/28/2011  . HTN (hypertension) with goal to be determined 10/28/2011  . DM (diabetes mellitus) ([place]) 10/28/2011  . Hyperlipidemia 10/28/2011  . Obesity   . Reflux esophagitis   . Herpes zoster   . Shingles     Carney Living PT DPT  07/17/2016, 3:44 PM  [facility] [address] [place], [place], [address] Phone: [phone]   Fax:  [phone]  Name: Tina Frank MRN: [id number] Date of Birth: [birth year]

## 2016-07-18 ENCOUNTER — Encounter: Payer: Self-pay | Admitting: Podiatry

## 2016-07-18 ENCOUNTER — Ambulatory Visit (INDEPENDENT_AMBULATORY_CARE_PROVIDER_SITE_OTHER): Payer: Medicare Other | Admitting: Podiatry

## 2016-07-18 DIAGNOSIS — M79676 Pain in unspecified toe(s): Secondary | ICD-10-CM

## 2016-07-18 DIAGNOSIS — B351 Tinea unguium: Secondary | ICD-10-CM

## 2016-07-18 DIAGNOSIS — E119 Type 2 diabetes mellitus without complications: Secondary | ICD-10-CM

## 2016-07-18 NOTE — Progress Notes (Signed)
Patient ID: Tina Frank, female   DOB: 09-23-1940, 76 y.o.   MRN: [id number] Complaint:  Visit Type: Patient returns to my office for continued preventative foot care services. Complaint: Patient states" my nails have grown long and thick and become painful to walk and wear shoes" Patient has been diagnosed with DM with no foot complications. The patient presents for preventative foot care services. No changes to ROS  Podiatric Exam: Vascular: dorsalis pedis and posterior tibial pulses are palpable bilateral. Capillary return is immediate. Temperature gradient is WNL. Skin turgor WNL  Sensorium: Normal Semmes Weinstein monofilament test. Normal tactile sensation bilaterally. Nail Exam: Pt has thick disfigured discolored nails with subungual debris noted bilateral entire nail hallux through fifth toenails Ulcer Exam: There is no evidence of ulcer or pre-ulcerative changes or infection. Orthopedic Exam: Muscle tone and strength are WNL. No limitations in general ROM. No crepitus or effusions noted. Foot type and digits show no abnormalities. Bony prominences are unremarkable. Skin: No Porokeratosis. No infection or ulcers  Diagnosis:  Onychomycosis, , Pain in right toe, pain in left toes  Treatment & Plan Procedures and Treatment: Consent by patient was obtained for treatment procedures. The patient understood the discussion of treatment and procedures well. All questions were answered thoroughly reviewed. Debridement of mycotic and hypertrophic toenails, 1 through 5 bilateral and clearing of subungual debris. No ulceration, no infection noted.  Return Visit-Office Procedure: Patient instructed to return to the office for a follow up visit 9 weeks  for continued evaluation and treatment.    Gardiner Barefoot DPM

## 2016-07-24 ENCOUNTER — Encounter: Payer: Self-pay | Admitting: Physical Therapy

## 2016-07-24 ENCOUNTER — Ambulatory Visit: Payer: Medicare Other | Admitting: Physical Therapy

## 2016-07-24 DIAGNOSIS — M6281 Muscle weakness (generalized): Secondary | ICD-10-CM

## 2016-07-24 DIAGNOSIS — R293 Abnormal posture: Secondary | ICD-10-CM

## 2016-07-24 NOTE — Therapy (Signed)
[place], [place], [address] Phone: [phone]   Fax:  [phone]  Physical Therapy Treatment/ Discharge   Patient Details  Name: Tina Frank MRN: [id number] Date of Birth: September 24, 1940 Referring Provider: Dr Delrae Rend  Encounter Date: 07/24/2016      PT End of Session - 07/24/16 1116    Visit Number 4   Number of Visits 16   PT Start Time 1104   PT Stop Time 1144   PT Time Calculation (min) 40 min   Activity Tolerance Patient tolerated treatment well   Behavior During Therapy [facility] for tasks assessed/performed      Past Medical History:  Diagnosis Date  . Arthritis    "probably in my shoulders and left hip" (02/14/2015)  . GERD (gastroesophageal reflux disease)    hx  . Hyperlipidemia   . Hypertension   . Obesity    is resolve- pt. highest weight was 180 10 yrs ago.  Marland Kitchen Reflux esophagitis   . Type II diabetes mellitus ([place])     Past Surgical History:  Procedure Laterality Date  . COLONOSCOPY    . EYE SURGERY     laser surgery for retinal tear.  Marland Kitchen JOINT REPLACEMENT    . PARATHYROIDECTOMY N/A 12/02/2012   Procedure: PARATHYROIDECTOMY with frozen section ;  Surgeon: Earnstine Regal, MD;  Location: [facility];  Service: General;  Laterality: N/A;  . SHOULDER INJECTION Left 07/27/2013   Procedure: SHOULDER INJECTION;  Surgeon: [name], MD;  Location: [place];  Service: Orthopedics;  Laterality: Left;  . STERIOD INJECTION Right 02/14/2015   Procedure: RIGHT SHOULDER CORTISONE INJECTION;  Surgeon: [name], MD;  Location: [place];  Service: Orthopedics;  Laterality: Right;  . TOTAL HIP ARTHROPLASTY Left 07/27/2013   Procedure: TOTAL HIP ARTHROPLASTY ANTERIOR APPROACH;  Surgeon: [name], MD;  Location: [place];  Service: Orthopedics;  Laterality: Left;  . TOTAL SHOULDER ARTHROPLASTY Left 02/14/2015  . TOTAL SHOULDER ARTHROPLASTY Left 02/14/2015   Procedure: LEFT TOTAL SHOULDER  ARTHROPLASTY;  Surgeon: [name], MD;  Location: [place];  Service: Orthopedics;  Laterality: Left;    There were no vitals filed for this visit.      Subjective Assessment - 07/24/16 1109    Subjective Patient reports that the modifications to her shoulder proeam have helped and she has been able to do some of the exercises without causing shoulder pain. She has had shoulder pain the last few days because of the weather. Her left knee is sore as well because of the rain.    Pertinent History osteoperosis, left shoulder replacement , right shoulder arthritis    Limitations Standing;Walking   Patient Stated Goals to have an exercise program to kep her strong    Currently in Pain? Yes   Pain Score 3    Pain Location Shoulder   Pain Orientation Right   Pain Descriptors / Indicators Aching   Pain Type Chronic pain   Pain Onset More than a month ago   Pain Frequency Constant   Aggravating Factors  use of the shoulders   Pain Relieving Factors rest    Effect of Pain on Daily Activities difficulty perfroming ADL's             [facility] PT Assessment - 07/24/16 0001      Strength   Right Hip Flexion 5/5   Right Hip ABduction 5/5   Left Hip Flexion 5/5   Left Hip  ABduction 5/5                     [facility] Adult PT Treatment/Exercise - 07/24/16 0001      Lumbar Exercises: Stretches   Passive Hamstring Stretch Limitations seated 90/90 stretch    Single Knee to Chest Stretch Limitations 2x15sec hold with towel    Lower Trunk Rotation Limitations x10   Piriformis Stretch Limitations 2x15 sec hold      Lumbar Exercises: Supine   Clam Limitations 2x10   Heel Slides Limitations x10 with abdominal breathing    Bent Knee Raise Limitations 2x10 with abdominal breathing    Other Supine Lumbar Exercises ball squeeze 2x10     Knee/Hip Exercises: Standing   Heel Raises Limitations 2x10    Knee Flexion Limitations standing marching 2x10    Hip Flexion Limitations 2x10     Abduction Limitations 2x10    Extension Limitations 2x10      Knee/Hip Exercises: Seated   Long Arc Quad Limitations 2x10     Knee/Hip Exercises: Supine   Other Supine Knee/Hip Exercises supine lateral trunk rotation 2x10      Shoulder Exercises: Standing   Other Standing Exercises shoulder extnesion yellow 2x10; scpa retraction yellow 2x10                PT Education - 07/24/16 1112    Education provided Yes   Education Details reviewed total HEP; reviewed symptom mangement    Person(s) Educated Patient   Methods Explanation;Demonstration   Comprehension Verbalized understanding;Returned demonstration;Verbal cues required          PT Short Term Goals - 07/24/16 1414      PT SHORT TERM GOAL #1   Title Patient will increase bilateral LE strength to 5/5    Baseline 5/5   Time 4   Period Weeks   Status Achieved     PT SHORT TERM GOAL #2   Title Patient will increase gorss bilateral upper extremity strength to 4/5   Time 4   Period Weeks   Status Achieved     PT SHORT TERM GOAL #3   Title Patient will demsotrate a good core contraction    Time 4   Period Weeks   Status Achieved           PT Long Term Goals - 07/24/16 1417      PT LONG TERM GOAL #1   Title Patient will verabilize an understanding of proper posture to protect her back with ADL's    Time 8   Period Weeks   Status Achieved     PT LONG TERM GOAL #2   Title Patient will be independent with a complete workout program to protect her back, prevent falls in the fututre, and help improve bone density.    Baseline given total program today    Time 8   Period Weeks   Status Achieved               Plan - 07/24/16 1123    Clinical Impression Statement Patient has reached all goals for therapy. She has a comprehensive strengthening and stretching program. Therapy reviewed symptom mangement nd activity progression. D/C to HEP.    Rehab Potential Good   PT Frequency 2x / week   PT  Duration 8 weeks      Patient will benefit from skilled therapeutic intervention in order to improve the following deficits and impairments:  Pain, Decreased range of motion, Decreased activity tolerance,  Decreased mobility, Decreased strength  Visit Diagnosis: Muscle weakness (generalized)  Abnormal posture       G-Codes - 07/24/16 1420    Functional Assessment Tool Used (Outpatient Only) clinical decision making    Functional Limitation Changing and maintaining body position   Changing and Maintaining Body Position Current Status (G8981) At least 1 percent but less than 20 percent impaired, limited or restricted   Changing and Maintaining Body Position Goal Status (G8982) At least 1 percent but less than 20 percent impaired, limited or restricted   Changing and Maintaining Body Position Discharge Status (G8983) At least 1 percent but less than 20 percent impaired, limited or restricted      Problem List Patient Active Problem List   Diagnosis Date Noted  . Degenerative joint disease of left shoulder 02/14/2015  . Pre-syncope 10/06/2013  . Leukocytosis 10/06/2013  . Hypotension 10/06/2013  . Primary localized osteoarthrosis, pelvic region and thigh 07/27/2013  . Hypotension (arterial) 10/28/2011  . HTN (hypertension) with goal to be determined 10/28/2011  . DM (diabetes mellitus) ([facility]) 10/28/2011  . Hyperlipidemia 10/28/2011  . Obesity   . Reflux esophagitis   . Herpes zoster   . Shingles    PHYSICAL THERAPY DISCHARGE SUMMARY  Visits from Start of Care:4  Current functional level related to goals / functional outcomes: Good tolerance to there-ex.    Remaining deficits: Pain in shoulders but this is baseline   Education / Equipment Has complete HEP    Plan: Patient agrees to discharge.  Patient goals were not met. Patient is being discharged due to meeting the stated rehab goals.  ?????       J  PT DPT  07/24/2016, 2:28 PM  [facility] [address] [place], [place], [address] Phone: [phone]   Fax:  [phone]  Name: Natoshia L Commisso MRN: [id number] Date of Birth: 11/20/1940   

## 2016-09-24 ENCOUNTER — Ambulatory Visit: Payer: Medicare Other | Admitting: Podiatry

## 2016-10-07 ENCOUNTER — Ambulatory Visit (INDEPENDENT_AMBULATORY_CARE_PROVIDER_SITE_OTHER): Payer: Medicare Other | Admitting: Podiatry

## 2016-10-07 ENCOUNTER — Encounter: Payer: Self-pay | Admitting: Podiatry

## 2016-10-07 DIAGNOSIS — E119 Type 2 diabetes mellitus without complications: Secondary | ICD-10-CM | POA: Diagnosis not present

## 2016-10-07 DIAGNOSIS — B351 Tinea unguium: Secondary | ICD-10-CM | POA: Diagnosis not present

## 2016-10-07 DIAGNOSIS — M79676 Pain in unspecified toe(s): Secondary | ICD-10-CM

## 2016-10-07 NOTE — Progress Notes (Signed)
Patient ID: Tina Frank, female   DOB: 07-24-1940, 76 y.o.   MRN: [id number] Complaint:  Visit Type: Patient returns to my office for continued preventative foot care services. Complaint: Patient states" my nails have grown long and thick and become painful to walk and wear shoes" Patient has been diagnosed with DM with no foot complications. The patient presents for preventative foot care services. No changes to ROS  Podiatric Exam: Vascular: dorsalis pedis and posterior tibial pulses are palpable bilateral. Capillary return is immediate. Temperature gradient is WNL. Skin turgor WNL  Sensorium: Normal Semmes Weinstein monofilament test. Normal tactile sensation bilaterally. Nail Exam: Pt has thick disfigured discolored nails with subungual debris noted bilateral entire nail hallux through fifth toenails Ulcer Exam: There is no evidence of ulcer or pre-ulcerative changes or infection. Orthopedic Exam: Muscle tone and strength are WNL. No limitations in general ROM. No crepitus or effusions noted. Foot type and digits show no abnormalities. Bony prominences are unremarkable. Skin: No Porokeratosis. No infection or ulcers  Diagnosis:  Onychomycosis, , Pain in right toe, pain in left toes  Treatment & Plan Procedures and Treatment: Consent by patient was obtained for treatment procedures. The patient understood the discussion of treatment and procedures well. All questions were answered thoroughly reviewed. Debridement of mycotic and hypertrophic toenails, 1 through 5 bilateral and clearing of subungual debris. No ulceration, no infection noted.  Return Visit-Office Procedure: Patient instructed to return to the office for a follow up visit 9 weeks  for continued evaluation and treatment.    Gardiner Barefoot DPM

## 2016-10-22 ENCOUNTER — Other Ambulatory Visit: Payer: Self-pay | Admitting: Obstetrics and Gynecology

## 2016-10-22 DIAGNOSIS — Z1231 Encounter for screening mammogram for malignant neoplasm of breast: Secondary | ICD-10-CM

## 2016-11-17 ENCOUNTER — Ambulatory Visit
Admission: RE | Admit: 2016-11-17 | Discharge: 2016-11-17 | Disposition: A | Discharge status: Home / Self Care | Payer: Medicare Other | Source: Ambulatory Visit | Attending: Obstetrics and Gynecology | Admitting: Obstetrics and Gynecology

## 2016-11-17 DIAGNOSIS — Z1231 Encounter for screening mammogram for malignant neoplasm of breast: Secondary | ICD-10-CM

## 2016-12-10 ENCOUNTER — Ambulatory Visit: Payer: Medicare Other | Admitting: Podiatry

## 2016-12-10 ENCOUNTER — Encounter: Payer: Self-pay | Admitting: Podiatry

## 2016-12-10 ENCOUNTER — Ambulatory Visit (INDEPENDENT_AMBULATORY_CARE_PROVIDER_SITE_OTHER): Payer: Medicare Other | Admitting: Podiatry

## 2016-12-10 DIAGNOSIS — E119 Type 2 diabetes mellitus without complications: Secondary | ICD-10-CM | POA: Diagnosis not present

## 2016-12-10 DIAGNOSIS — M79676 Pain in unspecified toe(s): Secondary | ICD-10-CM

## 2016-12-10 DIAGNOSIS — B351 Tinea unguium: Secondary | ICD-10-CM | POA: Diagnosis not present

## 2016-12-10 NOTE — Progress Notes (Signed)
Patient ID: Tina Frank, female   DOB: 07/06/1940, 76 y.o.   MRN: [id number] Complaint:  Visit Type: Patient returns to my office for continued preventative foot care services. Complaint: Patient states" my nails have grown long and thick and become painful to walk and wear shoes" Patient has been diagnosed with DM with no foot complications. The patient presents for preventative foot care services. No changes to ROS  Podiatric Exam: Vascular: dorsalis pedis and posterior tibial pulses are palpable bilateral. Capillary return is immediate. Temperature gradient is WNL. Skin turgor WNL  Sensorium: Normal Semmes Weinstein monofilament test. Normal tactile sensation bilaterally. Nail Exam: Pt has thick disfigured discolored nails with subungual debris noted bilateral entire nail hallux through fifth toenails Ulcer Exam: There is no evidence of ulcer or pre-ulcerative changes or infection. Orthopedic Exam: Muscle tone and strength are WNL. No limitations in general ROM. No crepitus or effusions noted. Foot type and digits show no abnormalities. Bony prominences are unremarkable. Skin: No Porokeratosis. No infection or ulcers  Diagnosis:  Onychomycosis, , Pain in right toe, pain in left toes  Treatment & Plan Procedures and Treatment: Consent by patient was obtained for treatment procedures. The patient understood the discussion of treatment and procedures well. All questions were answered thoroughly reviewed. Debridement of mycotic and hypertrophic toenails, 1 through 5 bilateral and clearing of subungual debris. No ulceration, no infection noted.  Return Visit-Office Procedure: Patient instructed to return to the office for a follow up visit 9 weeks  for continued evaluation and treatment.    Alf Doyle DPM 

## 2017-02-13 ENCOUNTER — Ambulatory Visit: Payer: Medicare Other | Admitting: Podiatry

## 2017-02-16 ENCOUNTER — Ambulatory Visit: Payer: Medicare Other | Admitting: Podiatry

## 2017-02-20 ENCOUNTER — Encounter: Payer: Self-pay | Admitting: Podiatry

## 2017-02-20 ENCOUNTER — Ambulatory Visit: Payer: Medicare Other | Admitting: Podiatry

## 2017-02-20 DIAGNOSIS — B351 Tinea unguium: Secondary | ICD-10-CM | POA: Diagnosis not present

## 2017-02-20 DIAGNOSIS — M79676 Pain in unspecified toe(s): Secondary | ICD-10-CM

## 2017-02-20 DIAGNOSIS — E119 Type 2 diabetes mellitus without complications: Secondary | ICD-10-CM

## 2017-02-20 NOTE — Progress Notes (Signed)
Patient ID: Tina Frank, female   DOB: 09/09/1940, 76 y.o.   MRN: [id number] Complaint:  Visit Type: Patient returns to my office for continued preventative foot care services. Complaint: Patient states" my nails have grown long and thick and become painful to walk and wear shoes" Patient has been diagnosed with DM with no foot complications. The patient presents for preventative foot care services. No changes to ROS  Podiatric Exam: Vascular: dorsalis pedis and posterior tibial pulses are palpable bilateral. Capillary return is immediate. Temperature gradient is WNL. Skin turgor WNL  Sensorium: Normal Semmes Weinstein monofilament test. Normal tactile sensation bilaterally. Nail Exam: Pt has thick disfigured discolored nails with subungual debris noted bilateral entire nail hallux through fifth toenails Ulcer Exam: There is no evidence of ulcer or pre-ulcerative changes or infection. Orthopedic Exam: Muscle tone and strength are WNL. No limitations in general ROM. No crepitus or effusions noted. Foot type and digits show no abnormalities. Bony prominences are unremarkable. Skin: No Porokeratosis. No infection or ulcers  Diagnosis:  Onychomycosis, , Pain in right toe, pain in left toes  Treatment & Plan Procedures and Treatment: Consent by patient was obtained for treatment procedures. The patient understood the discussion of treatment and procedures well. All questions were answered thoroughly reviewed. Debridement of mycotic and hypertrophic toenails, 1 through 5 bilateral and clearing of subungual debris. No ulceration, no infection noted.  Return Visit-Office Procedure: Patient instructed to return to the office for a follow up visit 9 weeks  for continued evaluation and treatment.    Anmol Fleck DPM 

## 2017-04-24 ENCOUNTER — Encounter: Payer: Self-pay | Admitting: Podiatry

## 2017-04-24 ENCOUNTER — Ambulatory Visit: Payer: Medicare Other | Admitting: Podiatry

## 2017-04-24 DIAGNOSIS — B351 Tinea unguium: Secondary | ICD-10-CM

## 2017-04-24 DIAGNOSIS — M79676 Pain in unspecified toe(s): Secondary | ICD-10-CM | POA: Diagnosis not present

## 2017-04-24 DIAGNOSIS — E119 Type 2 diabetes mellitus without complications: Secondary | ICD-10-CM

## 2017-04-24 NOTE — Progress Notes (Signed)
Patient ID: Tina Frank, female   DOB: 11/30/1940, 76 y.o.   MRN: [id number] Complaint:  Visit Type: Patient returns to my office for continued preventative foot care services. Complaint: Patient states" my nails have grown long and thick and become painful to walk and wear shoes" Patient has been diagnosed with DM with no foot complications. The patient presents for preventative foot care services. No changes to ROS  Podiatric Exam: Vascular: dorsalis pedis and posterior tibial pulses are palpable bilateral. Capillary return is immediate. Temperature gradient is WNL. Skin turgor WNL  Sensorium: Normal Semmes Weinstein monofilament test. Normal tactile sensation bilaterally. Nail Exam: Pt has thick disfigured discolored nails with subungual debris noted bilateral entire nail hallux through fifth toenails Ulcer Exam: There is no evidence of ulcer or pre-ulcerative changes or infection. Orthopedic Exam: Muscle tone and strength are WNL. No limitations in general ROM. No crepitus or effusions noted. Foot type and digits show no abnormalities. Bony prominences are unremarkable. Skin: No Porokeratosis. No infection or ulcers  Diagnosis:  Onychomycosis, , Pain in right toe, pain in left toes  Treatment & Plan Procedures and Treatment: Consent by patient was obtained for treatment procedures. The patient understood the discussion of treatment and procedures well. All questions were answered thoroughly reviewed. Debridement of mycotic and hypertrophic toenails, 1 through 5 bilateral and clearing of subungual debris. No ulceration, no infection noted.  Return Visit-Office Procedure: Patient instructed to return to the office for a follow up visit 9 weeks  for continued evaluation and treatment.    Britni Driscoll DPM 

## 2017-07-01 ENCOUNTER — Encounter: Payer: Self-pay | Admitting: Podiatry

## 2017-07-01 ENCOUNTER — Ambulatory Visit: Payer: Medicare Other | Admitting: Podiatry

## 2017-07-01 DIAGNOSIS — M79676 Pain in unspecified toe(s): Secondary | ICD-10-CM | POA: Diagnosis not present

## 2017-07-01 DIAGNOSIS — E119 Type 2 diabetes mellitus without complications: Secondary | ICD-10-CM | POA: Diagnosis not present

## 2017-07-01 DIAGNOSIS — B351 Tinea unguium: Secondary | ICD-10-CM | POA: Diagnosis not present

## 2017-07-01 NOTE — Progress Notes (Signed)
Patient ID: Tina Frank, female   DOB: 12/11/1940, 76 y.o.   MRN: [id number] Complaint:  Visit Type: Patient returns to my office for continued preventative foot care services. Complaint: Patient states" my nails have grown long and thick and become painful to walk and wear shoes" Patient has been diagnosed with DM with no foot complications. The patient presents for preventative foot care services. No changes to ROS  Podiatric Exam: Vascular: dorsalis pedis and posterior tibial pulses are palpable bilateral. Capillary return is immediate. Temperature gradient is WNL. Skin turgor WNL  Sensorium: Normal Semmes Weinstein monofilament test. Normal tactile sensation bilaterally. Nail Exam: Pt has thick disfigured discolored nails with subungual debris noted bilateral entire nail hallux through fifth toenails Ulcer Exam: There is no evidence of ulcer or pre-ulcerative changes or infection. Orthopedic Exam: Muscle tone and strength are WNL. No limitations in general ROM. No crepitus or effusions noted. Foot type and digits show no abnormalities. Bony prominences are unremarkable. Skin: No Porokeratosis. No infection or ulcers  Diagnosis:  Onychomycosis, , Pain in right toe, pain in left toes  Treatment & Plan Procedures and Treatment: Consent by patient was obtained for treatment procedures. The patient understood the discussion of treatment and procedures well. All questions were answered thoroughly reviewed. Debridement of mycotic and hypertrophic toenails, 1 through 5 bilateral and clearing of subungual debris. No ulceration, no infection noted.  Return Visit-Office Procedure: Patient instructed to return to the office for a follow up visit 9 weeks  for continued evaluation and treatment.    Jerilynn Feldmeier DPM 

## 2017-09-02 ENCOUNTER — Encounter: Payer: Self-pay | Admitting: Podiatry

## 2017-09-02 ENCOUNTER — Ambulatory Visit: Payer: Medicare Other | Admitting: Podiatry

## 2017-09-02 DIAGNOSIS — E119 Type 2 diabetes mellitus without complications: Secondary | ICD-10-CM | POA: Diagnosis not present

## 2017-09-02 DIAGNOSIS — B351 Tinea unguium: Secondary | ICD-10-CM

## 2017-09-02 DIAGNOSIS — M79676 Pain in unspecified toe(s): Secondary | ICD-10-CM | POA: Diagnosis not present

## 2017-09-02 NOTE — Progress Notes (Signed)
Patient ID: TANAIA HAWKEY, female   DOB: [birth year], 77 y.o.   MRN: [id number] Complaint:  Visit Type: Patient returns to my office for continued preventative foot care services. Complaint: Patient states" my nails have grown long and thick and become painful to walk and wear shoes" Patient has been diagnosed with DM with no foot complications. The patient presents for preventative foot care services. No changes to ROS  Podiatric Exam: Vascular: dorsalis pedis and posterior tibial pulses are palpable bilateral. Capillary return is immediate. Temperature gradient is WNL. Skin turgor WNL  Sensorium: Normal Semmes Weinstein monofilament test. Normal tactile sensation bilaterally. Nail Exam: Pt has thick disfigured discolored nails with subungual debris noted bilateral entire nail hallux through fifth toenails Ulcer Exam: There is no evidence of ulcer or pre-ulcerative changes or infection. Orthopedic Exam: Muscle tone and strength are WNL. No limitations in general ROM. No crepitus or effusions noted. Foot type and digits show no abnormalities. Bony prominences are unremarkable. Skin: No Porokeratosis. No infection or ulcers  Diagnosis:  Onychomycosis, , Pain in right toe, pain in left toes  Treatment & Plan Procedures and Treatment: Consent by patient was obtained for treatment procedures. The patient understood the discussion of treatment and procedures well. All questions were answered thoroughly reviewed. Debridement of mycotic and hypertrophic toenails, 1 through 5 bilateral and clearing of subungual debris. No ulceration, no infection noted.  Return Visit-Office Procedure: Patient instructed to return to the office for a follow up visit 9 weeks  for continued evaluation and treatment.    Gardiner Barefoot DPM

## 2017-09-21 ENCOUNTER — Other Ambulatory Visit: Payer: Self-pay | Admitting: Physician Assistant

## 2017-09-21 DIAGNOSIS — K219 Gastro-esophageal reflux disease without esophagitis: Secondary | ICD-10-CM

## 2017-09-23 ENCOUNTER — Ambulatory Visit
Admission: RE | Admit: 2017-09-23 | Discharge: 2017-09-23 | Disposition: A | Discharge status: Home / Self Care | Payer: Medicare Other | Source: Ambulatory Visit | Attending: Physician Assistant | Admitting: Physician Assistant

## 2017-09-23 DIAGNOSIS — K219 Gastro-esophageal reflux disease without esophagitis: Secondary | ICD-10-CM

## 2017-10-14 ENCOUNTER — Other Ambulatory Visit: Payer: Self-pay | Admitting: Obstetrics and Gynecology

## 2017-10-14 DIAGNOSIS — Z1231 Encounter for screening mammogram for malignant neoplasm of breast: Secondary | ICD-10-CM

## 2017-11-11 ENCOUNTER — Encounter: Payer: Self-pay | Admitting: Podiatry

## 2017-11-11 ENCOUNTER — Ambulatory Visit: Payer: Medicare Other | Admitting: Podiatry

## 2017-11-11 DIAGNOSIS — B351 Tinea unguium: Secondary | ICD-10-CM

## 2017-11-11 DIAGNOSIS — M79676 Pain in unspecified toe(s): Secondary | ICD-10-CM

## 2017-11-11 DIAGNOSIS — E119 Type 2 diabetes mellitus without complications: Secondary | ICD-10-CM | POA: Diagnosis not present

## 2017-11-11 NOTE — Progress Notes (Signed)
Patient ID: MALKIE WILLE, female   DOB: Aug 10, 1940, 77 y.o.   MRN: [id number] Complaint:  Visit Type: Patient returns to my office for continued preventative foot care services. Complaint: Patient states" my nails have grown long and thick and become painful to walk and wear shoes" Patient has been diagnosed with DM with no foot complications. The patient presents for preventative foot care services. No changes to ROS  Podiatric Exam: Vascular: dorsalis pedis and posterior tibial pulses are palpable bilateral. Capillary return is immediate. Temperature gradient is WNL. Skin turgor WNL  Sensorium: Normal Semmes Weinstein monofilament test. Normal tactile sensation bilaterally. Nail Exam: Pt has thick disfigured discolored nails with subungual debris noted bilateral entire nail hallux through fifth toenails Ulcer Exam: There is no evidence of ulcer or pre-ulcerative changes or infection. Orthopedic Exam: Muscle tone and strength are WNL. No limitations in general ROM. No crepitus or effusions noted. Foot type and digits show no abnormalities. Bony prominences are unremarkable. Skin: No Porokeratosis. No infection or ulcers  Diagnosis:  Onychomycosis, , Pain in right toe, pain in left toes  Treatment & Plan Procedures and Treatment: Consent by patient was obtained for treatment procedures. The patient understood the discussion of treatment and procedures well. All questions were answered thoroughly reviewed. Debridement of mycotic and hypertrophic toenails, 1 through 5 bilateral and clearing of subungual debris. No ulceration, no infection noted.  Return Visit-Office Procedure: Patient instructed to return to the office for a follow up visit 9 weeks  for continued evaluation and treatment.    Gardiner Barefoot DPM

## 2017-11-19 ENCOUNTER — Ambulatory Visit
Admission: RE | Admit: 2017-11-19 | Discharge: 2017-11-19 | Disposition: A | Discharge status: Home / Self Care | Payer: Medicare Other | Source: Ambulatory Visit | Attending: Obstetrics and Gynecology | Admitting: Obstetrics and Gynecology

## 2017-11-19 DIAGNOSIS — Z1231 Encounter for screening mammogram for malignant neoplasm of breast: Secondary | ICD-10-CM

## 2017-12-11 ENCOUNTER — Other Ambulatory Visit: Payer: Self-pay | Admitting: Orthopaedic Surgery

## 2017-12-11 DIAGNOSIS — M25512 Pain in left shoulder: Secondary | ICD-10-CM

## 2017-12-25 ENCOUNTER — Ambulatory Visit
Admission: RE | Admit: 2017-12-25 | Discharge: 2017-12-25 | Disposition: A | Discharge status: Home / Self Care | Payer: Medicare Other | Source: Ambulatory Visit | Attending: Orthopaedic Surgery | Admitting: Orthopaedic Surgery

## 2017-12-25 DIAGNOSIS — M25512 Pain in left shoulder: Secondary | ICD-10-CM

## 2017-12-25 MED ORDER — IOPAMIDOL (ISOVUE-M 200) INJECTION 41%
15.0000 mL | Freq: Once | INTRAMUSCULAR | Status: AC
  Administered 2017-12-25: 15 mL via INTRA_ARTICULAR

## 2017-12-29 ENCOUNTER — Other Ambulatory Visit (HOSPITAL_COMMUNITY): Payer: Self-pay | Admitting: Orthopaedic Surgery

## 2017-12-29 DIAGNOSIS — M25512 Pain in left shoulder: Principal | ICD-10-CM

## 2017-12-29 DIAGNOSIS — G8929 Other chronic pain: Secondary | ICD-10-CM

## 2018-01-01 ENCOUNTER — Encounter (HOSPITAL_COMMUNITY): Payer: Self-pay

## 2018-01-01 ENCOUNTER — Ambulatory Visit (HOSPITAL_COMMUNITY)
Admission: RE | Admit: 2018-01-01 | Discharge: 2018-01-01 | Disposition: A | Discharge status: Home / Self Care | Payer: Medicare Other | Source: Ambulatory Visit | Attending: Orthopaedic Surgery | Admitting: Orthopaedic Surgery

## 2018-01-07 ENCOUNTER — Ambulatory Visit (HOSPITAL_COMMUNITY)
Admission: RE | Admit: 2018-01-07 | Discharge: 2018-01-07 | Disposition: A | Discharge status: Home / Self Care | Payer: Medicare Other | Source: Ambulatory Visit | Attending: Orthopaedic Surgery | Admitting: Orthopaedic Surgery

## 2018-01-07 DIAGNOSIS — M25512 Pain in left shoulder: Secondary | ICD-10-CM | POA: Insufficient documentation

## 2018-01-07 DIAGNOSIS — G8929 Other chronic pain: Secondary | ICD-10-CM | POA: Diagnosis present

## 2018-01-07 MED ORDER — LIDOCAINE HCL (PF) 1 % IJ SOLN
5.0000 mL | Freq: Once | INTRAMUSCULAR | Status: AC
  Administered 2018-01-07: 5 mL via INTRADERMAL

## 2018-01-07 MED ORDER — SODIUM CHLORIDE (PF) 0.9 % IJ SOLN
10.0000 mL | Freq: Once | INTRAMUSCULAR | Status: AC
  Administered 2018-01-07: 5 mL via INTRAVENOUS

## 2018-01-07 MED ORDER — IOPAMIDOL (ISOVUE-M 200) INJECTION 41%
10.0000 mL | Freq: Once | INTRAMUSCULAR | Status: AC
  Administered 2018-01-07: 5 mL via INTRA_ARTICULAR

## 2018-01-07 MED ORDER — LIDOCAINE HCL (PF) 1 % IJ SOLN
INTRAMUSCULAR | Status: AC
  Administered 2018-01-07: 5 mL via INTRADERMAL
  Filled 2018-01-07: qty 5

## 2018-01-07 MED ORDER — IOPAMIDOL (ISOVUE-M 200) INJECTION 41%
INTRAMUSCULAR | Status: AC
  Filled 2018-01-07: qty 10

## 2018-01-08 MED FILL — Sodium Chloride Inj 0.9%: INTRAMUSCULAR | Qty: 10 | Status: AC

## 2018-01-13 ENCOUNTER — Encounter: Payer: Self-pay | Admitting: Podiatry

## 2018-01-13 ENCOUNTER — Ambulatory Visit: Payer: Medicare Other | Admitting: Podiatry

## 2018-01-13 DIAGNOSIS — E119 Type 2 diabetes mellitus without complications: Secondary | ICD-10-CM

## 2018-01-13 DIAGNOSIS — B351 Tinea unguium: Secondary | ICD-10-CM | POA: Diagnosis not present

## 2018-01-13 DIAGNOSIS — M79676 Pain in unspecified toe(s): Secondary | ICD-10-CM

## 2018-01-13 LAB — ANAEROBIC CULTURE

## 2018-01-13 NOTE — Progress Notes (Signed)
Patient ID: Tina Frank, female   DOB: 04/08/1940, 77 y.o.   MRN: [id number] Complaint:  Visit Type: Patient returns to my office for continued preventative foot care services. Complaint: Patient states" my nails have grown long and thick and become painful to walk and wear shoes" Patient has been diagnosed with DM with no foot complications. The patient presents for preventative foot care services. No changes to ROS  Podiatric Exam: Vascular: dorsalis pedis and posterior tibial pulses are palpable bilateral. Capillary return is immediate. Temperature gradient is WNL. Skin turgor WNL  Sensorium: Normal Semmes Weinstein monofilament test. Normal tactile sensation bilaterally. Nail Exam: Pt has thick disfigured discolored nails with subungual debris noted bilateral entire nail hallux through fifth toenails Ulcer Exam: There is no evidence of ulcer or pre-ulcerative changes or infection. Orthopedic Exam: Muscle tone and strength are WNL. No limitations in general ROM. No crepitus or effusions noted. Foot type and digits show no abnormalities. Bony prominences are unremarkable. Skin: No Porokeratosis. No infection or ulcers  Diagnosis:  Onychomycosis, , Pain in right toe, pain in left toes  Treatment & Plan Procedures and Treatment: Consent by patient was obtained for treatment procedures. The patient understood the discussion of treatment and procedures well. All questions were answered thoroughly reviewed. Debridement of mycotic and hypertrophic toenails, 1 through 5 bilateral and clearing of subungual debris. No ulceration, no infection noted.  Return Visit-Office Procedure: Patient instructed to return to the office for a follow up visit 9 weeks  for continued evaluation and treatment.    Shonica Weier DPM 

## 2018-01-21 LAB — BODY FLUID CULTURE: Culture: NO GROWTH

## 2018-01-29 ENCOUNTER — Ambulatory Visit (INDEPENDENT_AMBULATORY_CARE_PROVIDER_SITE_OTHER): Payer: Medicare Other | Admitting: Infectious Diseases

## 2018-01-29 ENCOUNTER — Encounter: Payer: Self-pay | Admitting: Infectious Diseases

## 2018-01-29 VITALS — BP 119/73 | HR 64 | Temp 97.9°F | Wt 135.0 lb

## 2018-01-29 DIAGNOSIS — K2100 Gastro-esophageal reflux disease with esophagitis, without bleeding: Secondary | ICD-10-CM

## 2018-01-29 DIAGNOSIS — M19212 Secondary osteoarthritis, left shoulder: Secondary | ICD-10-CM | POA: Diagnosis not present

## 2018-01-29 MED ORDER — CEPHALEXIN 500 MG PO CAPS: 500.0000 mg | ORAL_CAPSULE | Freq: Four times a day (QID) | ORAL | 3 refills | Status: DC

## 2018-01-29 NOTE — Progress Notes (Signed)
   Subjective:    Patient ID: Tina Frank, female    DOB: [birth year], 77 y.o.   MRN: [id number]  HPI 77 yo F with hx of DM2 (~ 10 yrs), GERD, L shoulder replacement 2016. 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  Her shoulder has been aspirated (01-07-18) and Cx (-). She states that the pain in her L shoulder is no worse that the pain in her R shoulder. She is worried about having further surgery (2 step) to replace her joint.   Takes 6 tylenol/day. NSAIDS raise her BP.  Please see HPI. All other systems reviewed and negative.  Review of Systems  Constitutional: Positive for chills and unexpected weight change. Negative for appetite change and fever.  Respiratory: Positive for cough.   Gastrointestinal: Positive for abdominal pain. Negative for constipation, diarrhea and nausea.  Genitourinary: Negative for difficulty urinating.  Musculoskeletal: Positive for arthralgias.  Psychiatric/Behavioral: Negative for sleep disturbance.  Please see HPI. All other systems reviewed and negative. Walks 35-40 min daily     Objective:   Physical Exam  Constitutional: She appears well-developed and well-nourished.  HENT:  Mouth/Throat: No oropharyngeal exudate.  Eyes: Pupils are equal, round, and reactive to light. EOM are normal.  Neck: Normal range of motion. Neck supple.  Cardiovascular: Normal rate, regular rhythm and normal heart sounds.  Pulmonary/Chest: Effort normal and breath sounds normal.  Abdominal: Soft. Bowel sounds are normal. She exhibits no distension. There is no tenderness.  Musculoskeletal: She exhibits no edema.       Arms: Lymphadenopathy:    She has no cervical adenopathy.  Skin: Skin is warm and dry.  Psychiatric: She has a  normal mood and affect.          Assessment & Plan:

## 2018-01-29 NOTE — Assessment & Plan Note (Signed)
She has good control with PPI.  She is aware of diet triggers and is mindful of this.

## 2018-01-29 NOTE — Assessment & Plan Note (Signed)
She would like to save surgery as her last option.  I explained several options to her- po anbx, IV anbx (Ceftriaxone) via pic, watchful waiting.  She would like to try po anbx for now. Will give her keflex (per up to date) and see her back in 6 weeks.  Will check her ESR and CRP today as well as CBC, CMP She asks about outcomes of no treatment- sepsis, worsening pain, frozen shoulder.

## 2018-01-29 NOTE — Assessment & Plan Note (Signed)
Her A1C has usually been good, has f/u with her family doctor next month.

## 2018-01-30 LAB — COMPREHENSIVE METABOLIC PANEL
AG Ratio: 1.1 (calc) (ref 1.0–2.5)
ALBUMIN MSPROF: 3.6 g/dL (ref 3.6–5.1)
ALKALINE PHOSPHATASE (APISO): 69 U/L (ref 33–130)
ALT: 6 U/L (ref 6–29)
AST: 11 U/L (ref 10–35)
BUN: 19 mg/dL (ref 7–25)
CO2: 26 mmol/L (ref 20–32)
CREATININE: 0.71 mg/dL (ref 0.60–0.93)
Calcium: 9.1 mg/dL (ref 8.6–10.4)
Chloride: 101 mmol/L (ref 98–110)
Globulin: 3.4 g/dL (calc) (ref 1.9–3.7)
Glucose, Bld: 119 mg/dL — ABNORMAL HIGH (ref 65–99)
Potassium: 3.8 mmol/L (ref 3.5–5.3)
Sodium: 137 mmol/L (ref 135–146)
Total Bilirubin: 0.4 mg/dL (ref 0.2–1.2)
Total Protein: 7 g/dL (ref 6.1–8.1)

## 2018-01-30 LAB — CBC
HEMATOCRIT: 36.4 % (ref 35.0–45.0)
Hemoglobin: 11.7 g/dL (ref 11.7–15.5)
MCH: 25.2 pg — ABNORMAL LOW (ref 27.0–33.0)
MCHC: 32.1 g/dL (ref 32.0–36.0)
MCV: 78.4 fL — ABNORMAL LOW (ref 80.0–100.0)
MPV: 10.3 fL (ref 7.5–12.5)
Platelets: 285 10*3/uL (ref 140–400)
RBC: 4.64 10*6/uL (ref 3.80–5.10)
RDW: 14.2 % (ref 11.0–15.0)
WBC: 7.7 10*3/uL (ref 3.8–10.8)

## 2018-01-30 LAB — SEDIMENTATION RATE: Sed Rate: 60 mm/h — ABNORMAL HIGH (ref 0–30)

## 2018-01-30 LAB — C-REACTIVE PROTEIN: CRP: 37 mg/L — AB (ref <8.0)

## 2018-03-16 ENCOUNTER — Ambulatory Visit: Payer: Medicare Other | Admitting: Infectious Diseases

## 2018-03-16 ENCOUNTER — Encounter: Payer: Self-pay | Admitting: Infectious Diseases

## 2018-03-16 ENCOUNTER — Other Ambulatory Visit: Payer: Self-pay | Admitting: Physician Assistant

## 2018-03-16 VITALS — Wt 135.0 lb

## 2018-03-16 DIAGNOSIS — I10 Essential (primary) hypertension: Secondary | ICD-10-CM

## 2018-03-16 DIAGNOSIS — M19212 Secondary osteoarthritis, left shoulder: Secondary | ICD-10-CM | POA: Diagnosis not present

## 2018-03-16 DIAGNOSIS — E6609 Other obesity due to excess calories: Secondary | ICD-10-CM | POA: Diagnosis not present

## 2018-03-16 MED ORDER — DENOSUMAB 60 MG/ML ~~LOC~~ SOSY: 60.0000 mg | PREFILLED_SYRINGE | SUBCUTANEOUS | 0 refills | Status: DC

## 2018-03-16 NOTE — Assessment & Plan Note (Addendum)
She appears to be doing well.  Has completed anbx Will recheck her ESR and CRP.  I suspect that she may have had degenerative changes from her prosthesis.  She does not want further surgery Will have her f/u prn.

## 2018-03-16 NOTE — Assessment & Plan Note (Addendum)
asx but elevated today.  Has taken prn Will f/u with PCP and renal.

## 2018-03-16 NOTE — Assessment & Plan Note (Signed)
Her BMIs have been normal since 2013.  Will remove/resolve

## 2018-03-16 NOTE — Progress Notes (Signed)
   Subjective:    Patient ID: Tina Frank, female    DOB: [birth year], 78 y.o.   MRN: [id number]  HPI 78 yo F with hx of DM2 (~ 10 yrs), GERD, L shoulder replacement 2016. 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  Her shoulder has been aspirated (01-07-18) and Cx (-). She was seen in ID 11-22 and was given several options. She chose to be start po keflex.  She noted no problems with this.  CRP 37 and ESR 60.  She continues to have pain in her shoulder. No swelling or heat.  Avoids heavy lifting.  Walks daily (weather permitting).   States he BP is up today as she had 2 lesions removed from her back this AM. 167/79.  Has prn that she takes when BP is up.  Had A1C done by PCP 02-19-18.    Has had flu shot.   Review of Systems  Respiratory: Negative for shortness of breath.   Cardiovascular: Negative for chest pain.  Musculoskeletal: Positive for arthralgias.  Neurological: Negative for headaches.  Please see HPI. All other systems reviewed and negative.      Objective:   Physical Exam Constitutional:      Appearance: Normal appearance. She is not ill-appearing.  Cardiovascular:     Rate and Rhythm: Normal rate and regular rhythm.  Pulmonary:     Effort: Pulmonary effort is normal.     Breath sounds: Normal breath sounds.  Abdominal:     General: Bowel sounds are normal. There is no distension.     Palpations: Abdomen is soft.     Tenderness: There is no abdominal tenderness.  Musculoskeletal:       Arms:     Right lower leg: No edema.     Left lower leg: No edema.  Neurological:     Mental Status: She is alert.       Assessment & Plan:

## 2018-03-17 LAB — C-REACTIVE PROTEIN: CRP: 20.9 mg/L — AB (ref <8.0)

## 2018-03-17 LAB — SEDIMENTATION RATE: Sed Rate: 53 mm/h — ABNORMAL HIGH (ref 0–30)

## 2018-03-19 ENCOUNTER — Telehealth: Payer: Self-pay | Admitting: Infectious Diseases

## 2018-03-19 NOTE — Telephone Encounter (Signed)
Called pt and asked her to continue her po anbx for another 6 weeks, due to increased CRP ESR.  Will have her come back in 6 weeks.

## 2018-03-24 ENCOUNTER — Encounter

## 2018-03-24 ENCOUNTER — Ambulatory Visit: Payer: Medicare Other | Admitting: Podiatry

## 2018-03-24 ENCOUNTER — Encounter: Payer: Self-pay | Admitting: Podiatry

## 2018-03-24 DIAGNOSIS — E119 Type 2 diabetes mellitus without complications: Secondary | ICD-10-CM | POA: Diagnosis not present

## 2018-03-24 DIAGNOSIS — B351 Tinea unguium: Secondary | ICD-10-CM | POA: Diagnosis not present

## 2018-03-24 DIAGNOSIS — M79676 Pain in unspecified toe(s): Secondary | ICD-10-CM | POA: Diagnosis not present

## 2018-03-24 NOTE — Progress Notes (Signed)
Patient ID: Tina Frank, female   DOB: 01/22/1941, 77 y.o.   MRN: [id number] Complaint:  Visit Type: Patient returns to my office for continued preventative foot care services. Complaint: Patient states" my nails have grown long and thick and become painful to walk and wear shoes" Patient has been diagnosed with DM with no foot complications. The patient presents for preventative foot care services. No changes to ROS  Podiatric Exam: Vascular: dorsalis pedis and posterior tibial pulses are palpable bilateral. Capillary return is immediate. Temperature gradient is WNL. Skin turgor WNL  Sensorium: Normal Semmes Weinstein monofilament test. Normal tactile sensation bilaterally. Nail Exam: Pt has thick disfigured discolored nails with subungual debris noted bilateral entire nail hallux through fifth toenails Ulcer Exam: There is no evidence of ulcer or pre-ulcerative changes or infection. Orthopedic Exam: Muscle tone and strength are WNL. No limitations in general ROM. No crepitus or effusions noted. Foot type and digits show no abnormalities. Bony prominences are unremarkable. Skin: No Porokeratosis. No infection or ulcers  Diagnosis:  Onychomycosis, , Pain in right toe, pain in left toes  Treatment & Plan Procedures and Treatment: Consent by patient was obtained for treatment procedures. The patient understood the discussion of treatment and procedures well. All questions were answered thoroughly reviewed. Debridement of mycotic and hypertrophic toenails, 1 through 5 bilateral and clearing of subungual debris. No ulceration, no infection noted.  Return Visit-Office Procedure: Patient instructed to return to the office for a follow up visit 9 weeks  for continued evaluation and treatment.    [place] DPM 

## 2018-05-07 ENCOUNTER — Encounter: Payer: Self-pay | Admitting: Infectious Diseases

## 2018-05-07 ENCOUNTER — Ambulatory Visit: Payer: Medicare Other | Admitting: Infectious Diseases

## 2018-05-07 DIAGNOSIS — N182 Chronic kidney disease, stage 2 (mild): Secondary | ICD-10-CM

## 2018-05-07 DIAGNOSIS — E1122 Type 2 diabetes mellitus with diabetic chronic kidney disease: Secondary | ICD-10-CM | POA: Diagnosis not present

## 2018-05-07 DIAGNOSIS — M19212 Secondary osteoarthritis, left shoulder: Secondary | ICD-10-CM | POA: Diagnosis not present

## 2018-05-07 MED ORDER — CEPHALEXIN 500 MG PO CAPS: 500.0000 mg | ORAL_CAPSULE | Freq: Four times a day (QID) | ORAL | 3 refills | Status: DC

## 2018-05-07 NOTE — Assessment & Plan Note (Addendum)
Appears to be well controled Appreciate renal and PCP f/u.

## 2018-05-07 NOTE — Assessment & Plan Note (Signed)
She is doing well We discussed that her inflammatory markers are better but still elevated.  We agreed to 6 total months of anbx, then repeat labs off anbx Check her ESR and CRp today rtc in 4 months.

## 2018-05-07 NOTE — Progress Notes (Signed)
   Subjective:    Patient ID: Tina Frank, female    DOB: 1940/03/18, 78 y.o.   MRN: [id number]  HPI 78 yo F with hx of DM2 (~ 10 yrs), GERD, L shoulder replacement 2016. 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  Her shoulder has been aspirated (01-07-18) and Cx (-). She was seen in ID 01-29-18 and was given several options. She chose to be start po keflex.   CRP 20.9 (03-16-2018) 37 (01-29-18) ESR 53 (03-16-2018) 60 (01-29-18)  Her last A1C was 6.1% (02-19-18), she continues on metformin alone.  She continues on keflex.  She had mild dysphagia recently was had dilation by GI. Her swallowing has improved.  She is currently at 12 weeks.   Review of Systems  Constitutional: Positive for chills. Negative for appetite change, fever and unexpected weight change.  HENT: Positive for congestion.   Gastrointestinal: Negative for constipation and diarrhea.  Genitourinary: Negative for difficulty urinating.  Musculoskeletal: Positive for arthralgias.  Please see HPI. All other systems reviewed and negative.      Objective:   Physical Exam Constitutional:      Appearance: Normal appearance.  HENT:     Mouth/Throat:     Mouth: Mucous membranes are moist.     Pharynx: No oropharyngeal exudate.  Eyes:     Extraocular Movements: Extraocular movements intact.     Pupils: Pupils are equal, round, and reactive to light.  Cardiovascular:     Rate and Rhythm: Normal rate and regular rhythm.  Pulmonary:     Effort: Pulmonary effort is normal.     Breath sounds: Normal breath sounds.  Abdominal:     General: Bowel sounds are normal.     Palpations: Abdomen is soft.  Musculoskeletal:        General: No swelling or deformity.       Arms:     Right lower  leg: No edema.     Left lower leg: No edema.  Neurological:     Mental Status: She is alert.           Assessment & Plan:

## 2018-05-08 LAB — C-REACTIVE PROTEIN: CRP: 25.7 mg/L — ABNORMAL HIGH (ref <8.0)

## 2018-05-08 LAB — SEDIMENTATION RATE: SED RATE: 55 mm/h — AB (ref 0–30)

## 2018-05-26 ENCOUNTER — Encounter: Payer: Self-pay | Admitting: Podiatry

## 2018-05-26 ENCOUNTER — Other Ambulatory Visit: Payer: Self-pay

## 2018-05-26 ENCOUNTER — Ambulatory Visit: Payer: Medicare Other | Admitting: Podiatry

## 2018-05-26 DIAGNOSIS — M79676 Pain in unspecified toe(s): Secondary | ICD-10-CM

## 2018-05-26 DIAGNOSIS — B351 Tinea unguium: Secondary | ICD-10-CM | POA: Diagnosis not present

## 2018-05-26 DIAGNOSIS — E119 Type 2 diabetes mellitus without complications: Secondary | ICD-10-CM

## 2018-05-26 NOTE — Progress Notes (Signed)
Patient ID: Tina Frank, female   DOB: 12/14/1940, 77 y.o.   MRN: [id number] Complaint:  Visit Type: Patient returns to my office for continued preventative foot care services. Complaint: Patient states" my nails have grown long and thick and become painful to walk and wear shoes" Patient has been diagnosed with DM with no foot complications. The patient presents for preventative foot care services. No changes to ROS  Podiatric Exam: Vascular: dorsalis pedis and posterior tibial pulses are palpable bilateral. Capillary return is immediate. Temperature gradient is WNL. Skin turgor WNL  Sensorium: Normal Semmes Weinstein monofilament test. Normal tactile sensation bilaterally. Nail Exam: Pt has thick disfigured discolored nails with subungual debris noted bilateral entire nail hallux through fifth toenails Ulcer Exam: There is no evidence of ulcer or pre-ulcerative changes or infection. Orthopedic Exam: Muscle tone and strength are WNL. No limitations in general ROM. No crepitus or effusions noted. Foot type and digits show no abnormalities. Bony prominences are unremarkable. Skin: No Porokeratosis. No infection or ulcers  Diagnosis:  Onychomycosis, , Pain in right toe, pain in left toes  Treatment & Plan Procedures and Treatment: Consent by patient was obtained for treatment procedures. The patient understood the discussion of treatment and procedures well. All questions were answered thoroughly reviewed. Debridement of mycotic and hypertrophic toenails, 1 through 5 bilateral and clearing of subungual debris. No ulceration, no infection noted.  Return Visit-Office Procedure: Patient instructed to return to the office for a follow up visit 9 weeks  for continued evaluation and treatment.    Bertran Zeimet DPM 

## 2018-06-05 ENCOUNTER — Other Ambulatory Visit: Payer: Self-pay | Admitting: Infectious Diseases

## 2018-06-05 DIAGNOSIS — M19212 Secondary osteoarthritis, left shoulder: Secondary | ICD-10-CM

## 2018-08-06 ENCOUNTER — Other Ambulatory Visit: Payer: Self-pay

## 2018-08-06 ENCOUNTER — Ambulatory Visit: Payer: Medicare Other | Admitting: Podiatry

## 2018-08-06 ENCOUNTER — Encounter: Payer: Self-pay | Admitting: Podiatry

## 2018-08-06 VITALS — Temp 98.1°F

## 2018-08-06 DIAGNOSIS — M79676 Pain in unspecified toe(s): Secondary | ICD-10-CM | POA: Diagnosis not present

## 2018-08-06 DIAGNOSIS — B351 Tinea unguium: Secondary | ICD-10-CM | POA: Diagnosis not present

## 2018-08-06 DIAGNOSIS — E119 Type 2 diabetes mellitus without complications: Secondary | ICD-10-CM

## 2018-08-06 NOTE — Progress Notes (Signed)
Patient ID: Tina Frank, female   DOB: [birth year], 78 y.o.   MRN: [id number] Complaint:  Visit Type: Patient returns to my office for continued preventative foot care services. Complaint: Patient states" my nails have grown long and thick and become painful to walk and wear shoes" Patient has been diagnosed with DM with no foot complications. The patient presents for preventative foot care services. No changes to ROS  Podiatric Exam: Vascular: dorsalis pedis and posterior tibial pulses are palpable bilateral. Capillary return is immediate. Temperature gradient is WNL. Skin turgor WNL  Sensorium: Normal Semmes Weinstein monofilament test. Normal tactile sensation bilaterally. Nail Exam: Pt has thick disfigured discolored nails with subungual debris noted bilateral entire nail hallux through fifth toenails Ulcer Exam: There is no evidence of ulcer or pre-ulcerative changes or infection. Orthopedic Exam: Muscle tone and strength are WNL. No limitations in general ROM. No crepitus or effusions noted. Foot type and digits show no abnormalities. Bony prominences are unremarkable. Skin: No Porokeratosis. No infection or ulcers  Diagnosis:  Onychomycosis, , Pain in right toe, pain in left toes  Treatment & Plan Procedures and Treatment: Consent by patient was obtained for treatment procedures. The patient understood the discussion of treatment and procedures well. All questions were answered thoroughly reviewed. Debridement of mycotic and hypertrophic toenails, 1 through 5 bilateral and clearing of subungual debris. No ulceration, no infection noted.  Return Visit-Office Procedure: Patient instructed to return to the office for a follow up visit 9 weeks  for continued evaluation and treatment.    Gardiner Barefoot DPM

## 2018-09-06 ENCOUNTER — Telehealth: Payer: Self-pay | Admitting: Infectious Diseases

## 2018-09-06 NOTE — Telephone Encounter (Signed)
COVID-19 Pre-Screening Questions: ° °Do you currently have a fever (>100 °F), chills or unexplained body aches? No  ° °Are you currently experiencing new cough, shortness of breath, sore throat, runny nose? No  °•  °Have you recently travelled outside the state of [place] in the last 14 days?no  °•  °Have you been in contact with someone that is currently pending confirmation of Covid19 testing or has been confirmed to have the Covid19 virus?  No  °

## 2018-09-07 ENCOUNTER — Encounter: Payer: Self-pay | Admitting: Infectious Diseases

## 2018-09-07 ENCOUNTER — Ambulatory Visit (INDEPENDENT_AMBULATORY_CARE_PROVIDER_SITE_OTHER): Payer: Medicare Other | Admitting: Infectious Diseases

## 2018-09-07 ENCOUNTER — Other Ambulatory Visit: Payer: Self-pay

## 2018-09-07 DIAGNOSIS — I10 Essential (primary) hypertension: Secondary | ICD-10-CM

## 2018-09-07 DIAGNOSIS — M19212 Secondary osteoarthritis, left shoulder: Secondary | ICD-10-CM | POA: Diagnosis not present

## 2018-09-07 DIAGNOSIS — N182 Chronic kidney disease, stage 2 (mild): Secondary | ICD-10-CM

## 2018-09-07 DIAGNOSIS — E1122 Type 2 diabetes mellitus with diabetic chronic kidney disease: Secondary | ICD-10-CM | POA: Diagnosis not present

## 2018-09-07 DIAGNOSIS — K2100 Gastro-esophageal reflux disease with esophagitis, without bleeding: Secondary | ICD-10-CM

## 2018-09-07 NOTE — Assessment & Plan Note (Signed)
She has persistent complaints about this. I will allow her PCP to adjust her rx (PPI).

## 2018-09-07 NOTE — Assessment & Plan Note (Signed)
Will repeat her labs off anbx Suspect she will need to go back on keflex.  Will call her with results.  rtc in 3-4 months

## 2018-09-07 NOTE — Assessment & Plan Note (Signed)
Up today.  Will defer to her PCP for med adjustment.

## 2018-09-07 NOTE — Progress Notes (Signed)
   Subjective:    Patient ID: Tina Frank, female    DOB: February 23, 1941, 78 y.o.   MRN: [id number]  HPI 78 yo F with hx of DM2 (~ 10 yrs), GERD/Hiatal Hernia, L THR,  L shoulder replacement 2016. 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  Her shoulder has been aspirated (01-07-18) and Cx (-). She was seen in ID 01-29-18 and was given several options. She chose to be start po keflex.   CRP 25.7 (05-07-18)   20.9 (03-16-2018)   37 (01-29-18) ESR    55 (05-07-18)  53 (03-16-2018)   60 (01-29-18)  Her last A1C was 6.1% (02-19-18)   Today she is worried that her acid reflux was worse since she has been off her keflex.  Also, her sinus drainage was better while on anbx.   She has completed 6 months of po anbx. Has been off 1 month as of today. He L shoulder pain has moved down her arm, but otherwise unchanged.     Review of Systems  Constitutional: Positive for chills. Negative for fever and unexpected weight change.  Respiratory: Negative for cough and shortness of breath.   Gastrointestinal: Positive for abdominal pain. Negative for constipation and diarrhea.  Genitourinary: Negative for difficulty urinating.  temps 97 at home.  Wt steady Please see HPI. All other systems reviewed and negative.     Objective:   Physical Exam Constitutional:      Appearance: Normal appearance.  HENT:     Mouth/Throat:     Mouth: Mucous membranes are moist.     Pharynx: No oropharyngeal exudate.  Eyes:     Extraocular Movements: Extraocular movements intact.     Pupils: Pupils are equal, round, and reactive to light.  Cardiovascular:     Rate and Rhythm: Normal rate and regular rhythm.  Pulmonary:     Effort: Pulmonary effort is normal.     Breath sounds:  Normal breath sounds.  Abdominal:     General: Bowel sounds are normal. There is no distension.     Palpations: Abdomen is soft.     Tenderness: There is no abdominal tenderness.  Musculoskeletal:       Arms:     Right lower leg: No edema.     Left lower leg: No edema.  Neurological:     Mental Status: She is alert.           Assessment & Plan:

## 2018-09-07 NOTE — Assessment & Plan Note (Signed)
Had repeat HgB A1C last week. Does not have result yet. Prev was 6.1%

## 2018-09-08 ENCOUNTER — Telehealth: Payer: Self-pay | Admitting: Infectious Diseases

## 2018-09-08 DIAGNOSIS — M19212 Secondary osteoarthritis, left shoulder: Secondary | ICD-10-CM

## 2018-09-08 LAB — HEMOGLOBIN A1C
Hgb A1c MFr Bld: 6 % of total Hgb — ABNORMAL HIGH (ref <5.7)
Mean Plasma Glucose: 126 (calc)
eAG (mmol/L): 7 (calc)

## 2018-09-08 LAB — C-REACTIVE PROTEIN: CRP: 11.9 mg/L — ABNORMAL HIGH (ref <8.0)

## 2018-09-08 LAB — SEDIMENTATION RATE: Sed Rate: 31 mm/h — ABNORMAL HIGH (ref 0–30)

## 2018-09-08 MED ORDER — CEPHALEXIN 500 MG PO CAPS: 500.0000 mg | ORAL_CAPSULE | Freq: Four times a day (QID) | ORAL | 3 refills | Status: DC

## 2018-09-08 NOTE — Telephone Encounter (Signed)
Called pt to let her know lab results Asked her to resume keflex

## 2018-10-08 ENCOUNTER — Encounter: Payer: Self-pay | Admitting: Podiatry

## 2018-10-08 ENCOUNTER — Ambulatory Visit (INDEPENDENT_AMBULATORY_CARE_PROVIDER_SITE_OTHER): Payer: Medicare Other | Admitting: Podiatry

## 2018-10-08 ENCOUNTER — Other Ambulatory Visit: Payer: Self-pay

## 2018-10-08 VITALS — Temp 97.2°F

## 2018-10-08 DIAGNOSIS — M79676 Pain in unspecified toe(s): Secondary | ICD-10-CM | POA: Diagnosis not present

## 2018-10-08 DIAGNOSIS — B351 Tinea unguium: Secondary | ICD-10-CM

## 2018-10-08 DIAGNOSIS — E119 Type 2 diabetes mellitus without complications: Secondary | ICD-10-CM | POA: Diagnosis not present

## 2018-10-08 NOTE — Progress Notes (Signed)
Patient ID: MELYNA HURON, female   DOB: [birth year], 78 y.o.   MRN: [id number] Complaint:  Visit Type: Patient returns to my office for continued preventative foot care services. Complaint: Patient states" my nails have grown long and thick and become painful to walk and wear shoes" Patient has been diagnosed with DM with no foot complications. The patient presents for preventative foot care services. No changes to ROS  Podiatric Exam: Vascular: dorsalis pedis and posterior tibial pulses are palpable bilateral. Capillary return is immediate. Temperature gradient is WNL. Skin turgor WNL  Sensorium: Normal Semmes Weinstein monofilament test. Normal tactile sensation bilaterally. Nail Exam: Pt has thick disfigured discolored nails with subungual debris noted bilateral entire nail hallux through fifth toenails Ulcer Exam: There is no evidence of ulcer or pre-ulcerative changes or infection. Orthopedic Exam: Muscle tone and strength are WNL. No limitations in general ROM. No crepitus or effusions noted. Foot type and digits show no abnormalities. Bony prominences are unremarkable. Skin: No Porokeratosis. No infection or ulcers  Diagnosis:  Onychomycosis, , Pain in right toe, pain in left toes  Treatment & Plan Procedures and Treatment: Consent by patient was obtained for treatment procedures. The patient understood the discussion of treatment and procedures well. All questions were answered thoroughly reviewed. Debridement of mycotic and hypertrophic toenails, 1 through 5 bilateral and clearing of subungual debris. No ulceration, no infection noted.  Return Visit-Office Procedure: Patient instructed to return to the office for a follow up visit 9 weeks  for continued evaluation and treatment.    Gardiner Barefoot DPM

## 2018-10-18 ENCOUNTER — Other Ambulatory Visit: Payer: Self-pay | Admitting: Obstetrics and Gynecology

## 2018-10-18 DIAGNOSIS — Z1231 Encounter for screening mammogram for malignant neoplasm of breast: Secondary | ICD-10-CM

## 2018-12-03 ENCOUNTER — Ambulatory Visit: Payer: Medicare Other

## 2018-12-07 ENCOUNTER — Other Ambulatory Visit: Payer: Self-pay | Admitting: Orthopaedic Surgery

## 2018-12-07 DIAGNOSIS — G8929 Other chronic pain: Secondary | ICD-10-CM

## 2018-12-17 ENCOUNTER — Other Ambulatory Visit: Payer: Self-pay

## 2018-12-17 ENCOUNTER — Encounter: Payer: Self-pay | Admitting: Infectious Diseases

## 2018-12-17 ENCOUNTER — Ambulatory Visit (INDEPENDENT_AMBULATORY_CARE_PROVIDER_SITE_OTHER): Payer: Medicare Other | Admitting: Infectious Diseases

## 2018-12-17 ENCOUNTER — Ambulatory Visit: Payer: Medicare Other | Admitting: Infectious Diseases

## 2018-12-17 DIAGNOSIS — E1122 Type 2 diabetes mellitus with diabetic chronic kidney disease: Secondary | ICD-10-CM

## 2018-12-17 DIAGNOSIS — N182 Chronic kidney disease, stage 2 (mild): Secondary | ICD-10-CM | POA: Diagnosis not present

## 2018-12-17 DIAGNOSIS — K2100 Gastro-esophageal reflux disease with esophagitis, without bleeding: Secondary | ICD-10-CM

## 2018-12-17 DIAGNOSIS — M19212 Secondary osteoarthritis, left shoulder: Secondary | ICD-10-CM | POA: Diagnosis not present

## 2018-12-17 NOTE — Assessment & Plan Note (Signed)
Improved off metformin.

## 2018-12-17 NOTE — Progress Notes (Signed)
   Subjective:    Patient ID: Raul Del, female    DOB: 10-19-1940, 78 y.o.   MRN: [id number]  HPI 78 yo F with hx of DM2 (~ 10 yrs), GERD/Hiatal Hernia, L THR,  L shoulder replacement 2016. 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  Her shoulder has been aspirated (01-07-18) and Cx (-). She was seen in ID 11-22-19and was given several options. She chose to be start po keflex.   CRP  11.9 (09-07-18)  25.7 (05-07-18)              20.9 (03-16-2018)              37 (01-29-18) ESR    31 (08-2018)  55 (05-07-18)             53 (03-16-2018)              60 (01-29-18)  Her last A1C was 6.0% (08-2018)  At her f/u 08-2018, she was off her anbx. We repeated her las and her ESR and CRP remained elevated. I called her and we resumed these.  Has noted swelling in her L shoulder recently. She has worsened pain as well. She saw her surgeon and is now scheduled for MRI on 12-29-18. She will have f/u visit with him on the 23rd.  Has been doing well on keflex qid.  Has had f/c, "I take tylenol constantly". She attributes this to her allergies.  Off metformin now, GERD now better.   Review of Systems  Constitutional: Negative for chills and unexpected weight change.  Respiratory: Negative for cough and shortness of breath.   Gastrointestinal: Negative for constipation and diarrhea.  Genitourinary: Negative for difficulty urinating.  Musculoskeletal: Positive for arthralgias and joint swelling.  Please see HPI. All other systems reviewed and negative.     Objective:   Physical Exam Constitutional:      Appearance: Normal appearance.  HENT:     Mouth/Throat:     Mouth: Mucous membranes are moist.     Pharynx: No oropharyngeal exudate.  Eyes:     Extraocular  Movements: Extraocular movements intact.  Cardiovascular:     Rate and Rhythm: Normal rate and regular rhythm.     Heart sounds: Murmur present.  Pulmonary:     Effort: Pulmonary effort is normal.     Breath sounds: Normal breath sounds.  Abdominal:     General: Bowel sounds are normal. There is no distension.     Palpations: Abdomen is soft.     Tenderness: There is no abdominal tenderness.  Musculoskeletal:       Arms:     Right lower leg: No edema.     Left lower leg: No edema.  Neurological:     Mental Status: She is alert.  Psychiatric:        Mood and Affect: Mood normal.       Assessment & Plan:

## 2018-12-17 NOTE — Assessment & Plan Note (Addendum)
She has significant swelling of her arm She has multiple questions about possible IV anbx, PIC line, restrictions ect.   I attempted to answer these.   Will await her MRI (10-21) and possible aspiration. Please send for routine, fungal, AFB.   Will ask her to stop keflex prior to aspiration if she has aspiration.  Will ask her to call after she has her MRI.  Will see her back in 1 month  She defers flu shot today.

## 2018-12-17 NOTE — Assessment & Plan Note (Signed)
She appears to be well controlled off metformin.  She has f/u with her FPTS beginning of next month.

## 2018-12-21 ENCOUNTER — Ambulatory Visit: Payer: Medicare Other | Admitting: Infectious Diseases

## 2018-12-28 ENCOUNTER — Other Ambulatory Visit: Payer: Self-pay | Admitting: Infectious Diseases

## 2018-12-28 DIAGNOSIS — M19212 Secondary osteoarthritis, left shoulder: Secondary | ICD-10-CM

## 2018-12-29 ENCOUNTER — Other Ambulatory Visit: Payer: Self-pay

## 2018-12-29 ENCOUNTER — Ambulatory Visit
Admission: RE | Admit: 2018-12-29 | Discharge: 2018-12-29 | Disposition: A | Discharge status: Home / Self Care | Payer: Medicare Other | Source: Ambulatory Visit | Attending: Orthopaedic Surgery | Admitting: Orthopaedic Surgery

## 2018-12-29 DIAGNOSIS — G8929 Other chronic pain: Secondary | ICD-10-CM

## 2018-12-29 DIAGNOSIS — M25512 Pain in left shoulder: Secondary | ICD-10-CM

## 2019-01-03 ENCOUNTER — Ambulatory Visit (INDEPENDENT_AMBULATORY_CARE_PROVIDER_SITE_OTHER): Payer: Medicare Other | Admitting: Sports Medicine

## 2019-01-03 ENCOUNTER — Encounter: Payer: Self-pay | Admitting: Sports Medicine

## 2019-01-03 ENCOUNTER — Other Ambulatory Visit: Payer: Self-pay

## 2019-01-03 DIAGNOSIS — M19212 Secondary osteoarthritis, left shoulder: Secondary | ICD-10-CM

## 2019-01-03 NOTE — Progress Notes (Addendum)
Subjective:    CC: Swelling in left arm and shoulder  HPI:  This is a very pleasant 78 year old female, she is post left total shoulder arthroplasty by Dr. Amada Jupiter back in 2016.  She has developed significant swelling, pain.  She was seen by Dr. Griffin Basil, infection is suspected.  She has been on oral Keflex, MRI was obtained and she is referred to me for ultrasound-guided arthrocentesis.  Pain is moderate, persistent, localized in the shoulder without radiation, no constitutional symptoms.  She has also been followed by infectious diseases.  I reviewed the past medical history, family history, social history, surgical history, and allergies today and no changes were needed.  Please see the problem list section below in epic for further details.  Past Medical History: Past Medical History:  Diagnosis Date  . Arthritis    "probably in my shoulders and left hip" (02/14/2015)  . GERD (gastroesophageal reflux disease)    hx  . Hyperlipidemia   . Hypertension   . Obesity    is resolve- pt. highest weight was 180 10 yrs ago.  Marland Kitchen Reflux esophagitis   . Type II diabetes mellitus ([place])    Past Surgical History: Past Surgical History:  Procedure Laterality Date  . COLONOSCOPY    . EYE SURGERY     laser surgery for retinal tear.  Marland Kitchen JOINT REPLACEMENT    . PARATHYROIDECTOMY N/A 12/02/2012   Procedure: PARATHYROIDECTOMY with frozen section ;  Surgeon: Earnstine Regal, MD;  Location: [facility];  Service: General;  Laterality: N/A;  . SHOULDER INJECTION Left 07/27/2013   Procedure: SHOULDER INJECTION;  Surgeon: [name], MD;  Location: [place];  Service: Orthopedics;  Laterality: Left;  . STERIOD INJECTION Right 02/14/2015   Procedure: RIGHT SHOULDER CORTISONE INJECTION;  Surgeon: [name], MD;  Location: [place];  Service: Orthopedics;  Laterality: Right;  . TOTAL HIP ARTHROPLASTY Left 07/27/2013   Procedure: TOTAL HIP ARTHROPLASTY ANTERIOR APPROACH;  Surgeon: [name], MD;  Location:  [place];  Service: Orthopedics;  Laterality: Left;  . TOTAL SHOULDER ARTHROPLASTY Left 02/14/2015  . TOTAL SHOULDER ARTHROPLASTY Left 02/14/2015   Procedure: LEFT TOTAL SHOULDER ARTHROPLASTY;  Surgeon: [name], MD;  Location: [place];  Service: Orthopedics;  Laterality: Left;   Social History: Social History   Socioeconomic History  . Marital status: Widowed    Spouse name: Not on file  . Number of children: Not on file  . Years of education: Not on file  . Highest education level: Not on file  Occupational History  . Not on file  Social Needs  . Financial resource strain: Not on file  . Food insecurity    Worry: Not on file    Inability: Not on file  . Transportation needs    Medical: Not on file    Non-medical: Not on file  Tobacco Use  . Smoking status: Former Smoker    Packs/day: 1.00    Years: 20.00    Pack years: 20.00    Types: Cigarettes    Quit date: 10/28/1987    Years since quitting: 31.2  . Smokeless tobacco: Never Used  . Tobacco comment: quit smoking > 25 years ago.  Substance and Sexual Activity  . Alcohol use: No  . Drug use: No  . Sexual activity: Yes  Lifestyle  . Physical activity    Days per week: Not on file    Minutes per session: Not on file  . Stress: Not on file  Relationships  . Social Herbalist on phone: Not on file    Gets together: Not on file    Attends religious service: Not on file    Active member of club or organization: Not on file    Attends meetings of clubs or organizations: Not on file    Relationship status: Not on file  Other Topics Concern  . Not on file  Social History Narrative  . Not on file   Family History: Family History  Problem Relation Age of Onset  . Heart failure Mother   . Cancer Mother   . Cancer Father   . Breast cancer Neg Hx    Allergies: Allergies  Allergen Reactions  . Crestor [Rosuvastatin] Other (See Comments)    Muscle aches  . Norvasc [Amlodipine Besylate] Swelling     Ankle swelling  . Simvastatin Other (See Comments)    Muscle aches  . Sulfa Antibiotics Rash  . Zetia [Ezetimibe] Other (See Comments)    Muscle aches   Medications: See med rec.  Review of Systems: No headache, visual changes, nausea, vomiting, diarrhea, constipation, dizziness, abdominal pain, skin rash, fevers, chills, night sweats, swollen lymph nodes, weight loss, chest pain, body aches, joint swelling, muscle aches, shortness of breath, mood changes, visual or auditory hallucinations.  Objective:    General: Well Developed, well nourished, and in no acute distress.  Neuro: Alert and oriented x3, extra-ocular muscles intact, sensation grossly intact.  HEENT: Normocephalic, atraumatic, pupils equal round reactive to light, neck supple, no masses, no lymphadenopathy, thyroid nonpalpable.  Skin: Warm and dry, no rashes noted.  Cardiac: Regular rate and rhythm, no murmurs rubs or gallops.  Respiratory: Clear to auscultation bilaterally. Not using accessory muscles, speaking in full sentences.  Abdominal: Soft, nontender, nondistended, positive bowel sounds, no masses, no organomegaly.  Musculoskeletal: Significant swelling behind the left upper arm.  Minimally warm, tender.  She has limited range of motion of her left shoulder with significant pain.  MRI personally reviewed and shows a large cystic structure posterior to the left humerus, there is some debris on the anterior that appears to be hematoma.  Arthroplasty is obscured by metal artifact.  Procedure: Real-time Ultrasound Guided  cystic lesion in the left posterior upper arm Device: GE Logiq E  Verbal informed consent obtained.  Time-out conducted.  Noted no overlying erythema, induration, or other signs of local infection.  Skin prepped in a sterile fashion with chlorhexidine.  Local anesthesia: Topical Ethyl chloride.  With sterile technique and under real time ultrasound guidance:  18-gauge spinal needle advanced into the  lesion, I aspirated approximately 480 mL of slightly cloudy straw-colored fluid. Completed without difficulty  Advised to call if fevers/chills, erythema, induration, drainage, or persistent bleeding.  Images permanently stored and available for review in the ultrasound unit.  Impression: Technically successful ultrasound guided aspiration.  The upper arm was strapped tightly with a compressive dressing.  Impression and Recommendations:    The patient was counselled, risk factors were discussed, anticipatory guidance given.  Degenerative joint disease of left shoulder Aspiration of 480 mL of straw-colored cloudy fluid. We will be sending these for routine culture, cell counts, fungal cultures, AFB. I am also going to send some for [facility] testing. Keep follow-up with Dr. Johnnye Sima and Dr. Griffin Basil. I will forward Synovasure results to Dr. Johnnye Sima and Dr. Griffin Basil.  Synovasure testing is come back positive/abnormal.  Positive alpha defensin, positive neutrophil elastase.  Results will be scanned into lab tab.  ___________________________________________ Gwen Her. Dianah Field, M.D., ABFM., CAQSM. Primary Care and Sports Medicine [facility]  Adjunct Professor of [place] [employer]

## 2019-01-03 NOTE — Assessment & Plan Note (Addendum)
Aspiration of 480 mL of straw-colored cloudy fluid. We will be sending these for routine culture, cell counts, fungal cultures, AFB. I am also going to send some for [facility] testing. Keep follow-up with Dr. Johnnye Sima and Dr. Griffin Basil. I will forward Synovasure results to Dr. Johnnye Sima and Dr. Griffin Basil.  Synovasure testing is come back positive/abnormal.  Positive alpha defensin, positive neutrophil elastase.  Results will be scanned into lab tab.

## 2019-01-05 ENCOUNTER — Ambulatory Visit: Payer: Medicare Other | Admitting: Podiatry

## 2019-01-05 ENCOUNTER — Telehealth: Payer: Self-pay | Admitting: *Deleted

## 2019-01-05 ENCOUNTER — Other Ambulatory Visit: Payer: Self-pay

## 2019-01-05 ENCOUNTER — Encounter: Payer: Self-pay | Admitting: Podiatry

## 2019-01-05 DIAGNOSIS — B351 Tinea unguium: Secondary | ICD-10-CM | POA: Diagnosis not present

## 2019-01-05 DIAGNOSIS — M79676 Pain in unspecified toe(s): Secondary | ICD-10-CM | POA: Diagnosis not present

## 2019-01-05 DIAGNOSIS — E119 Type 2 diabetes mellitus without complications: Secondary | ICD-10-CM

## 2019-01-05 NOTE — Progress Notes (Signed)
Patient ID: Tina Frank, female   DOB: 01/01/1941, 78 y.o.   MRN: [id number] Complaint:  Visit Type: Patient returns to my office for continued preventative foot care services. Complaint: Patient states" my nails have grown long and thick and become painful to walk and wear shoes" Patient has been diagnosed with DM with no foot complications. The patient presents for preventative foot care services. No changes to ROS  Podiatric Exam: Vascular: dorsalis pedis and posterior tibial pulses are palpable bilateral. Capillary return is immediate. Temperature gradient is WNL. Skin turgor WNL  Sensorium: Normal Semmes Weinstein monofilament test. Normal tactile sensation bilaterally. Nail Exam: Pt has thick disfigured discolored nails with subungual debris noted bilateral entire nail hallux through fifth toenails Ulcer Exam: There is no evidence of ulcer or pre-ulcerative changes or infection. Orthopedic Exam: Muscle tone and strength are WNL. No limitations in general ROM. No crepitus or effusions noted. Foot type and digits show no abnormalities. Bony prominences are unremarkable. Skin: No Porokeratosis. No infection or ulcers  Diagnosis:  Onychomycosis, , Pain in right toe, pain in left toes  Treatment & Plan Procedures and Treatment: Consent by patient was obtained for treatment procedures. The patient understood the discussion of treatment and procedures well. All questions were answered thoroughly reviewed. Debridement of mycotic and hypertrophic toenails, 1 through 5 bilateral and clearing of subungual debris. No ulceration, no infection noted.  Return Visit-Office Procedure: Patient instructed to return to the office for a follow up visit 9 weeks  for continued evaluation and treatment.    Gardiner Barefoot DPM

## 2019-01-05 NOTE — Telephone Encounter (Signed)
Her MRI showed 12 x 8.5 x 7.3 cm largely cystic lesion in the posterior aspect of the upper arm.  The fluid showed many WBC which could be consistent with infection.  Her Cx is still pending. She should continue her anbx.

## 2019-01-05 NOTE — Telephone Encounter (Signed)
Patient called to report that she has had her MRI done 12/28/18. She had fluid removed 01/03/19 and culture was done. It is time for her refill of antibiotic and she wants to know if she should continue to take the antibiotic or should she wait until the culture is done. Advised her will ask provider and give her a call back and that he is out of the office so may be tomorrow before I call her back.  If she is to continue the oral antibiotic she will need refills.

## 2019-01-06 ENCOUNTER — Telehealth: Payer: Self-pay | Admitting: Infectious Diseases

## 2019-01-06 NOTE — Telephone Encounter (Signed)
Per Dr Johnnye Sima called patient to advise her she does need to continue her oral antibiotic at lease until she has her culture results.

## 2019-01-06 NOTE — Telephone Encounter (Signed)
Called pt to discuss her mri, aspiration.  She will resume her po keflex until we have her Cx back. She has f/u appt next month.

## 2019-01-14 ENCOUNTER — Ambulatory Visit
Admission: RE | Admit: 2019-01-14 | Discharge: 2019-01-14 | Disposition: A | Discharge status: Home / Self Care | Payer: Medicare Other | Source: Ambulatory Visit | Attending: Obstetrics and Gynecology | Admitting: Obstetrics and Gynecology

## 2019-01-14 ENCOUNTER — Other Ambulatory Visit: Payer: Self-pay

## 2019-01-14 DIAGNOSIS — Z1231 Encounter for screening mammogram for malignant neoplasm of breast: Secondary | ICD-10-CM

## 2019-01-18 ENCOUNTER — Ambulatory Visit: Payer: Medicare Other | Admitting: Infectious Diseases

## 2019-01-18 ENCOUNTER — Other Ambulatory Visit: Payer: Self-pay

## 2019-01-18 ENCOUNTER — Encounter: Payer: Self-pay | Admitting: Infectious Diseases

## 2019-01-18 DIAGNOSIS — M19212 Secondary osteoarthritis, left shoulder: Secondary | ICD-10-CM | POA: Diagnosis not present

## 2019-01-18 DIAGNOSIS — N182 Chronic kidney disease, stage 2 (mild): Secondary | ICD-10-CM

## 2019-01-18 DIAGNOSIS — E1122 Type 2 diabetes mellitus with diabetic chronic kidney disease: Secondary | ICD-10-CM | POA: Diagnosis not present

## 2019-01-18 NOTE — Progress Notes (Signed)
Subjective:    Patient ID: Tina Frank, female    DOB: 1940-12-03, 78 y.o.   MRN: [id number]  HPI 78 yo F with hx of DM2 (~ 10 yrs), GERD/Hiatal Hernia,L THR,L shoulder replacement 2016. 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  Her shoulder has been aspirated (01-07-18) and Cx (-). She was seen in ID 11-22-19and was given several options. She chose to be start po keflex.   CRP     11.9 (09-07-18)             25.7 (05-07-18) 20.9 (03-16-2018)  37 (01-29-18) ESR31 (08-2018)             55 (05-07-18) 53 (03-16-2018) 60 (01-29-18)  Her last A1C is pending (12-2018)  At her f/u 08-2018, she was off her anbx. We repeated her las and her ESR and CRP remained elevated. I called her and we resumed these.  Has noted swelling in her L shoulder recently. She has worsened pain as well. She saw her surgeon and is now scheduled for MRI on 12-29-18. She will have f/u visit with him on the 23rd.  Has been doing well on keflex qid. This was not held prior to her aspirate.  She was seen in ID in October had worsening shoulder swelling. She had MRI showing: 1. 12 x 8.5 x 7.3 cm largely cystic lesion in the posterior aspect of the upper arm. This contains heterogeneous material which could be hematoma with hemosiderin or synovial debris from the joint. There is evidence of communication with the joint as demonstrated on the prior CT arthrogram. 2. Significant artifact from a shoulder prosthesis. Prior CT such showed significant particle disease. She underwent aspiration which showed 4460 WBC (80% N). Her Cx was (-), AFB smear (-), fungal smear (-).   She feels like her shoulder is swelling again. She is  having more pain.  No fever, has chills sometimes which she attributes to her allergies.   Review of Systems  Constitutional: Positive for chills. Negative for appetite change, fever and unexpected weight change.  Respiratory: Negative for cough and shortness of breath.   Gastrointestinal: Negative for constipation and diarrhea.  Genitourinary: Negative for difficulty urinating.  Musculoskeletal: Positive for arthralgias and joint swelling.       Objective:   Physical Exam Constitutional:      Appearance: Normal appearance. She is normal weight.  HENT:     Mouth/Throat:     Mouth: Mucous membranes are moist.     Pharynx: No oropharyngeal exudate.  Eyes:     Extraocular Movements: Extraocular movements intact.     Pupils: Pupils are equal, round, and reactive to light.  Neck:     Musculoskeletal: Normal range of motion and neck supple.  Cardiovascular:     Rate and Rhythm: Normal rate and regular rhythm.  Pulmonary:     Effort: Pulmonary effort is normal.     Breath sounds: Normal breath sounds.  Abdominal:     General: Bowel sounds are normal. There is no distension.     Palpations: Abdomen is soft.     Tenderness: There is no abdominal tenderness.  Musculoskeletal:        General: Swelling and tenderness present.  Arms:  Neurological:     Mental Status: She is alert.           Assessment & Plan:   

## 2019-01-18 NOTE — Assessment & Plan Note (Addendum)
Will stop her po anbx in anticipation of surgery and resection of her L shoulder prosthesis.  Anticipate repeat Cx.  Will likely need IV anbx via pic.  Please have ID see in hospital during surgery adm. I've asked her to call if she has worsening while off anbx. We will see her urgently.

## 2019-01-18 NOTE — Assessment & Plan Note (Signed)
Her FSG have been up, she has been off metformin recently due to GERD.  She has A1C pending.

## 2019-01-31 ENCOUNTER — Other Ambulatory Visit (HOSPITAL_COMMUNITY): Payer: Self-pay | Admitting: Orthopaedic Surgery

## 2019-01-31 DIAGNOSIS — Z789 Other specified health status: Secondary | ICD-10-CM

## 2019-02-01 LAB — CELL COUNT AND DIFF, FLUID, OTHER
Basophils, %: 0 %
Eosinophils, %: 0 %
Lymphocytes, %: 14 %
Mesothelial, %: 0 %
Monocyte/Macrophage %: 6 %
Neutrophils, %: 80 %
Total Nucleated Cell Ct: 4460 cells/uL

## 2019-02-01 LAB — FUNGUS CULTURE W SMEAR
MICRO NUMBER:: 1030585
SMEAR:: NONE SEEN
SPECIMEN QUALITY:: ADEQUATE

## 2019-02-01 LAB — AFB STAIN
MICRO NUMBER:: 1030582
SMEAR RESULT:: NONE SEEN
SPECIMEN QUALITY:: ADEQUATE

## 2019-02-01 LAB — ANAEROBIC AND AEROBIC CULTURE
AER RESULT:: NO GROWTH
MICRO NUMBER:: 1030583
MICRO NUMBER:: 1030584
SPECIMEN QUALITY:: ADEQUATE
SPECIMEN QUALITY:: ADEQUATE

## 2019-02-01 LAB — SYNOVIAL FLUID, CRYSTAL

## 2019-02-01 NOTE — Patient Instructions (Addendum)
DUE TO COVID-19 ONLY ONE VISITOR IS ALLOWED TO COME WITH YOU AND STAY IN THE WAITING ROOM ONLY DURING PRE OP AND PROCEDURE. THE ONE VISITOR MAY VISIT WITH YOU IN YOUR PRIVATE ROOM DURING VISITING HOURS ONLY!!   COVID SWAB TESTING MUST BE COMPLETED ON:   02/05/2019 @ 9:50  [place], [place] [facility] enter pre surgical testing line (Must self quarantine after testing. Follow instructions on handout.)         Your procedure is scheduled on: 02/09/2019   Report to [facility] Main  Entrance    Report to admitting at 6:30 AM   Call this number if you have problems the morning of surgery [phone]  NO SOLID FOOD AFTER MIDNIGHT THE NIGHT PRIOR TO SURGERY. NOTHING BY MOUTH EXCEPT CLEAR LIQUIDS UNTIL 5:30 AM . PLEASE FINISH G2 DRINK PER SURGEON ORDER  WHICH NEEDS TO BE COMPLETED AT 5:30 AM .  CLEAR LIQUID DIET  Foods Allowed                                                                     Foods Excluded  Water, Black Coffee and tea, regular and decaf                             liquids that you cannot  Plain Jell-O in any flavor  (No red and purple)                                           see through such as: Fruit ices (not with fruit pulp)                                     milk, soups, orange juice  Iced Popsicles (No red and purple)                                    All solid food Carbonated beverages, regular and diet                                    Apple juices Sports drinks like Gatorade (No red and purple) Lightly seasoned clear broth or consume(fat free) Sugar, honey syrup       Brush your teeth the morning of surgery. After that no water, candy, gum or mints   Do NOT smoke after Midnight   Take these medicines the morning of surgery with A SIP OF WATER: Carvedilol, Clonidine, prn, Hydralazine, Protonix  DO NOT TAKE ANY DIABETIC MEDICATIONS DAY OF YOUR SURGERY                               You may not have any metal on  your body including hair pins, jewelry, and body piercings  Do not wear make-up, lotions, powders, perfumes/cologne, or deodorant              Do not wear nail polish.  Do not shave  48 hours prior to surgery.              Do not bring valuables to the hospital. [place].   Contacts, dentures or bridgework may not be worn into surgery.   Bring small overnight bag day of surgery.    Special Instructions: Bring a copy of your healthcare power of attorney and living will documents the day of surgery if you haven't scanned them in before.             Please read over the following fact sheets you were given:   [facility] - Preparing for Surgery Before surgery, you can play an important role.  Because skin is not sterile, your skin needs to be as free of germs as possible.  You can reduce the number of germs on your skin by washing with CHG (chlorahexidine gluconate) soap before surgery.  CHG is an antiseptic cleaner which kills germs and bonds with the skin to continue killing germs even after washing. Please DO NOT use if you have an allergy to CHG or antibacterial soaps.  If your skin becomes reddened/irritated stop using the CHG and inform your nurse when you arrive at Short Stay. Do not shave (including legs and underarms) for at least 48 hours prior to the first CHG shower.  You may shave your face/neck. Please follow these instructions carefully:  1.  Shower with CHG Soap the night before surgery and the  morning of Surgery.  2.  If you choose to wash your hair, wash your hair first as usual with your  normal  shampoo.  3.  After you shampoo, rinse your hair and body thoroughly to remove the  shampoo.                           4.  Use CHG as you would any other liquid soap.  You can apply chg directly  to the skin and wash                       Gently with a scrungie or clean washcloth.  5.  Apply the CHG Soap to your body  ONLY FROM THE NECK DOWN.   Do not use on face/ open                           Wound or open sores. Avoid contact with eyes, ears mouth and genitals (private parts).                       Wash face,  Genitals (private parts) with your normal soap.             6.  Wash thoroughly, paying special attention to the area where your surgery  will be performed.  7.  Thoroughly rinse your body with warm water from the neck down.  8.  DO NOT shower/wash with your normal soap after using and rinsing off  the CHG Soap.                9.  Pat yourself dry with  a clean towel.            10.  Wear clean pajamas.            11.  Place clean sheets on your bed the night of your first shower and do not  sleep with pets. Day of Surgery : Do not apply any lotions/deodorants the morning of surgery.  Please wear clean clothes to the hospital/surgery center.  FAILURE TO FOLLOW THESE INSTRUCTIONS MAY RESULT IN THE CANCELLATION OF YOUR SURGERY PATIENT SIGNATURE_________________________________  NURSE SIGNATURE__________________________________  ________________________________________________________________________

## 2019-02-02 ENCOUNTER — Encounter (HOSPITAL_COMMUNITY)
Admission: RE | Admit: 2019-02-02 | Discharge: 2019-02-02 | Disposition: A | Discharge status: Home / Self Care | Payer: Medicare Other | Source: Ambulatory Visit | Attending: Orthopaedic Surgery | Admitting: Orthopaedic Surgery

## 2019-02-02 ENCOUNTER — Other Ambulatory Visit: Payer: Self-pay

## 2019-02-02 ENCOUNTER — Encounter (HOSPITAL_COMMUNITY): Payer: Self-pay

## 2019-02-02 DIAGNOSIS — I10 Essential (primary) hypertension: Secondary | ICD-10-CM | POA: Insufficient documentation

## 2019-02-02 DIAGNOSIS — Z01818 Encounter for other preprocedural examination: Secondary | ICD-10-CM | POA: Diagnosis not present

## 2019-02-02 DIAGNOSIS — E119 Type 2 diabetes mellitus without complications: Secondary | ICD-10-CM | POA: Insufficient documentation

## 2019-02-02 DIAGNOSIS — M25512 Pain in left shoulder: Secondary | ICD-10-CM | POA: Diagnosis not present

## 2019-02-02 DIAGNOSIS — R001 Bradycardia, unspecified: Secondary | ICD-10-CM | POA: Diagnosis not present

## 2019-02-02 LAB — BASIC METABOLIC PANEL
Anion gap: 8 (ref 5–15)
BUN: 29 mg/dL — ABNORMAL HIGH (ref 8–23)
CO2: 25 mmol/L (ref 22–32)
Calcium: 9.2 mg/dL (ref 8.9–10.3)
Chloride: 105 mmol/L (ref 98–111)
Creatinine, Ser: 0.8 mg/dL (ref 0.44–1.00)
GFR calc Af Amer: >60 mL/min (ref >60)
GFR calc non Af Amer: >60 mL/min (ref >60)
Glucose, Bld: 130 mg/dL — ABNORMAL HIGH (ref 70–99)
Potassium: 4.2 mmol/L (ref 3.5–5.1)
Sodium: 138 mmol/L (ref 135–145)

## 2019-02-02 LAB — HEMOGLOBIN A1C
Hgb A1c MFr Bld: 6.2 % — ABNORMAL HIGH (ref 4.8–5.6)
Mean Plasma Glucose: 131.24 mg/dL

## 2019-02-02 LAB — CBC
HCT: 40.7 % (ref 36.0–46.0)
Hemoglobin: 12.6 g/dL (ref 12.0–15.0)
MCH: 26.8 pg (ref 26.0–34.0)
MCHC: 31 g/dL (ref 30.0–36.0)
MCV: 86.6 fL (ref 80.0–100.0)
Platelets: 231 10*3/uL (ref 150–400)
RBC: 4.7 MIL/uL (ref 3.87–5.11)
RDW: 14.6 % (ref 11.5–15.5)
WBC: 8.2 10*3/uL (ref 4.0–10.5)
nRBC: 0 % (ref 0.0–0.2)

## 2019-02-02 LAB — GLUCOSE, CAPILLARY: Glucose-Capillary: 116 mg/dL — ABNORMAL HIGH (ref 70–99)

## 2019-02-02 LAB — SURGICAL PCR SCREEN
MRSA, PCR: NEGATIVE
Staphylococcus aureus: NEGATIVE

## 2019-02-05 ENCOUNTER — Other Ambulatory Visit (HOSPITAL_COMMUNITY)
Admission: RE | Admit: 2019-02-05 | Discharge: 2019-02-05 | Disposition: A | Discharge status: Home / Self Care | Payer: Medicare Other | Source: Ambulatory Visit | Attending: Orthopaedic Surgery | Admitting: Orthopaedic Surgery

## 2019-02-05 DIAGNOSIS — Z01812 Encounter for preprocedural laboratory examination: Secondary | ICD-10-CM | POA: Insufficient documentation

## 2019-02-05 DIAGNOSIS — Z20828 Contact with and (suspected) exposure to other viral communicable diseases: Secondary | ICD-10-CM | POA: Diagnosis not present

## 2019-02-06 LAB — NOVEL CORONAVIRUS, NAA (HOSP ORDER, SEND-OUT TO REF LAB; TAT 18-24 HRS): SARS-CoV-2, NAA: NOT DETECTED

## 2019-02-07 NOTE — H&P (Signed)
PREOPERATIVE H&P  Chief Complaint: LEFT SHOULDER INFECTION  HPI: Tina Frank is a 78 y.o. female who presents for preoperative history and physical prior to scheduled surgery,  TOTAL SHOULDER REVISION.   Patient has a past medical history significant for Type 2 DM, HTN, hyperlipidemia, and GERD.   Patient is an 78 year-old who had a [employer] total shoulder arthroplasty done by Dr. Amada Jupiter in 2016 who we have been treating for chronic infection for one year with suppressing antibiotics.  She had recently started developing posterior shoulder swelling that had been drained by Dr. Aundria Mems on 10/26 and had 500 cc of fluid that was removed.  Her Synovasure testing was positive, but there was no specific growth in her cultures, though she is on suppressing antibiotics.  She has now seen Dr. Johnnye Sima from infectious disease and is continuing to have pain and worsening swelling and is unhappy with her shoulder function.  Patient reports that pain is worsening and she is unhappy.    Her symptoms are rated as moderate to severe, and have been worsening.  This is significantly impairing activities of daily living.    Please see clinic note for further details on this patient's care.    She has elected for surgical management.   Past Medical History:  Diagnosis Date  . Arthritis    "probably in my shoulders and left hip" (02/14/2015)  . GERD (gastroesophageal reflux disease)    hx  . Hyperlipidemia   . Hypertension   . Obesity    is resolve- pt. highest weight was 180 10 yrs ago.  Marland Kitchen Reflux esophagitis   . Type II diabetes mellitus ([place])    Past Surgical History:  Procedure Laterality Date  . COLONOSCOPY    . EYE SURGERY     laser surgery for retinal tear.  Marland Kitchen JOINT REPLACEMENT    . PARATHYROIDECTOMY N/A 12/02/2012   Procedure: PARATHYROIDECTOMY with frozen section ;  Surgeon: Earnstine Regal, MD;  Location: [facility];  Service: General;  Laterality: N/A;  . SHOULDER INJECTION Left  07/27/2013   Procedure: SHOULDER INJECTION;  Surgeon: [name], MD;  Location: [place];  Service: Orthopedics;  Laterality: Left;  . STERIOD INJECTION Right 02/14/2015   Procedure: RIGHT SHOULDER CORTISONE INJECTION;  Surgeon: [name], MD;  Location: [place];  Service: Orthopedics;  Laterality: Right;  . TOTAL HIP ARTHROPLASTY Left 07/27/2013   Procedure: TOTAL HIP ARTHROPLASTY ANTERIOR APPROACH;  Surgeon: [name], MD;  Location: [place];  Service: Orthopedics;  Laterality: Left;  . TOTAL SHOULDER ARTHROPLASTY Left 02/14/2015  . TOTAL SHOULDER ARTHROPLASTY Left 02/14/2015   Procedure: LEFT TOTAL SHOULDER ARTHROPLASTY;  Surgeon: [name], MD;  Location: [place];  Service: Orthopedics;  Laterality: Left;   Social History   Socioeconomic History  . Marital status: Widowed    Spouse name: Not on file  . Number of children: Not on file  . Years of education: Not on file  . Highest education level: Not on file  Occupational History  . Not on file  Social Needs  . Financial resource strain: Not on file  . Food insecurity    Worry: Not on file    Inability: Not on file  . Transportation needs    Medical: Not on file    Non-medical: Not on file  Tobacco Use  . Smoking status: Former Smoker    Packs/day: 1.00    Years: 20.00    Pack years: 20.00  Types: Cigarettes    Quit date: 10/28/1987    Years since quitting: 31.3  . Smokeless tobacco: Never Used  . Tobacco comment: quit smoking > 25 years ago.  Substance and Sexual Activity  . Alcohol use: No  . Drug use: No  . Sexual activity: Yes  Lifestyle  . Physical activity    Days per week: Not on file    Minutes per session: Not on file  . Stress: Not on file  Relationships  . Social Herbalist on phone: Not on file    Gets together: Not on file    Attends religious service: Not on file    Active member of club or organization: Not on file    Attends meetings of clubs or organizations: Not on file     Relationship status: Not on file  Other Topics Concern  . Not on file  Social History Narrative  . Not on file   Family History  Problem Relation Age of Onset  . Heart failure Mother   . Cancer Mother   . Cancer Father   . Breast cancer Neg Hx    Allergies  Allergen Reactions  . Crestor [Rosuvastatin] Other (See Comments)    Muscle aches  . Norvasc [Amlodipine Besylate] Swelling    Ankle swelling  . Simvastatin Other (See Comments)    Muscle aches  . Sulfa Antibiotics Rash  . Zetia [Ezetimibe] Other (See Comments)    Muscle aches   Prior to Admission medications   Medication Sig Start Date End Date Taking? Authorizing Provider  acetaminophen (TYLENOL) 500 MG tablet Take 1,000 mg by mouth every 8 (eight) hours as needed for moderate pain or headache.   Yes [provider]  atorvastatin (LIPITOR) 10 MG tablet Take 10 mg by mouth at bedtime.    Yes [provider]  calcium carbonate (TUMS - DOSED IN MG ELEMENTAL CALCIUM) 500 MG chewable tablet Chew 1 tablet by mouth 3 (three) times daily as needed for indigestion or heartburn.   Yes [provider]  carvedilol (COREG) 12.5 MG tablet Take 12.5 mg by mouth 2 (two) times daily with a meal.  12/04/14  Yes [provider]  cloNIDine (CATAPRES) 0.1 MG tablet Take 0.1 mg by mouth daily as needed (if systolic bp is over 0000000).    Yes [provider]  denosumab (PROLIA) 60 MG/ML SOSY injection Inject 60 mg into the skin every 6 (six) months. 03/16/18  Yes Campbell Riches, MD  diclofenac Sodium (VOLTAREN) 1 % GEL Apply 1 application topically 4 (four) times daily as needed (pain).   Yes [provider]  Ensure (ENSURE) Take 237 mLs by mouth daily.   Yes [provider]  furosemide (LASIX) 40 MG tablet Take 40 mg by mouth 2 (two) times daily.  08/15/17  Yes [provider]  hydrALAZINE (APRESOLINE) 50 MG tablet Take 50 mg by mouth 2 (two) times daily. 12/04/14  Yes [provider]  pantoprazole (PROTONIX) 40 MG tablet Take 40 mg by mouth See admin instructions. Take 40 mg once daily, may take a second 40 mg dose as needed for acid reflux 08/23/18  Yes [provider]  telmisartan (MICARDIS) 80 MG tablet Take 0.5 tablets (40 mg total) by mouth daily. Patient taking differently: Take 40 mg by mouth 2 (two) times daily.  10/07/13  Yes Kelvin Cellar, MD  aspirin EC 81 MG tablet Take 81 mg by mouth daily.    [provider]    ROS: All other systems have been reviewed and were otherwise negative with the exception of those mentioned in the HPI and as above.  Physical Exam: General: Alert, no acute distress Cardiovascular: No pedal edema Respiratory: No cyanosis, no use of accessory musculature GI: No organomegaly, abdomen is soft and non-tender Skin: No lesions in the area of chief complaint Neurologic: Sensation intact distally Psychiatric: Patient is competent for consent with normal mood and affect Lymphatic: No axillary or cervical lymphadenopathy  MUSCULOSKELETAL:  Left shoulder: Obvious swelling in the posterior aspect of the humerus.  Incision is benign.  Able to actively forward elevate to about 80, passive to 100 with pain.  Cuff strength is 4/5 throughout.    Imaging: MRI left shoulder: (prior to aspiration): reviewed with metal subtraction.  There is still significant artifact; however, there is a large fluid collection in the posterior aspect of the humerus with communication to the joint and hemosiderin type deposits, which could be a pseudotumor type lesion.    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  We did report that she has a high likelihood of the fluid collection  coming back, as the fluid has clearly dissected a path.  We will try and excise some of the cyst, however, our goal of surgery is not to necessarily address the fluid pocket itself, however, it is to treat the infection.  We talked about the risks, benefits and alternatives and the fact that she will need ID consultation and a PICC line at the time of her surgery.    The risks benefits and alternatives were discussed with the patient including but not limited to the risks of nonoperative treatment, versus surgical intervention including infection, bleeding, nerve injury,  blood clots, cardiopulmonary complications, morbidity, mortality, among others, and they were willing to proceed.   We additionally specifically discussed risks of axillary nerve injury, infection, periprosthetic fracture, continued pain and longevity of implants prior to beginning procedure.    Patient will be admitted for inpatient treatment for surgery, pain control, OT, prophylactic antibiotics, VTE prophylaxis, ID consultation, PICC line care, and discharge planning. The patient is planning to be discharged home with outpatient PT.   The patient acknowledged the explanation, agreed to proceed with the plan and consent was signed.   Operative Plan: Explantation of her implants and antibiotic spacer placemen Discharge Medications: Tylenol, Mobic, Oxycodone, Zofran DVT Prophylaxis: None Physical Therapy: Outpatient (start date TBD) Special Discharge needs: [place], PA-C  02/07/2019 2:50 PM

## 2019-02-08 ENCOUNTER — Other Ambulatory Visit: Payer: Self-pay

## 2019-02-08 ENCOUNTER — Ambulatory Visit (HOSPITAL_COMMUNITY)
Admission: RE | Admit: 2019-02-08 | Discharge: 2019-02-08 | Disposition: A | Discharge status: Home / Self Care | Payer: Medicare Other | Source: Ambulatory Visit | Attending: Orthopaedic Surgery | Admitting: Orthopaedic Surgery

## 2019-02-08 DIAGNOSIS — Z789 Other specified health status: Secondary | ICD-10-CM

## 2019-02-08 DIAGNOSIS — I998 Other disorder of circulatory system: Secondary | ICD-10-CM | POA: Insufficient documentation

## 2019-02-08 MED ORDER — HEPARIN SOD (PORK) LOCK FLUSH 100 UNIT/ML IV SOLN
INTRAVENOUS | Status: DC | PRN
  Administered 2019-02-08: 500 [IU]

## 2019-02-08 MED ORDER — HEPARIN SOD (PORK) LOCK FLUSH 100 UNIT/ML IV SOLN
INTRAVENOUS | Status: AC
  Filled 2019-02-08: qty 5

## 2019-02-08 MED ORDER — LIDOCAINE HCL 1 % IJ SOLN
INTRAMUSCULAR | Status: DC | PRN
  Administered 2019-02-08: 5 mL

## 2019-02-08 MED ORDER — LIDOCAINE HCL 1 % IJ SOLN
INTRAMUSCULAR | Status: AC
  Filled 2019-02-08: qty 20

## 2019-02-08 NOTE — Procedures (Signed)
Right double lumen basilic vein PICC placed.  Length 40 cm.  Tip SVC /right atrial junction.  Medication used-1% lidocaine  to skin and subcutaneous tissue.  No immediate complications EBL none.  Okay to use.

## 2019-02-08 NOTE — Anesthesia Preprocedure Evaluation (Addendum)
Anesthesia Evaluation  Patient identified by MRN, date of birth, ID band Patient awake    Reviewed: Allergy & Precautions, NPO status , Patient's Chart, lab work & pertinent test results  Airway Mallampati: II  TM Distance: >3 FB Neck ROM: Full    Dental  (+) Dental Advisory Given   Pulmonary former smoker,    breath sounds clear to auscultation       Cardiovascular hypertension, Pt. on medications and Pt. on home beta blockers  Rhythm:Regular Rate:Normal     Neuro/Psych negative neurological ROS     GI/Hepatic Neg liver ROS, GERD  ,  Endo/Other  diabetes, Type 2  Renal/GU negative Renal ROS     Musculoskeletal  (+) Arthritis ,   Abdominal   Peds  Hematology negative hematology ROS (+)   Anesthesia Other Findings   Reproductive/Obstetrics                            Lab Results  Component Value Date   WBC 8.2 02/02/2019   HGB 12.6 02/02/2019   HCT 40.7 02/02/2019   MCV 86.6 02/02/2019   PLT 231 02/02/2019   Lab Results  Component Value Date   CREATININE 0.80 02/02/2019   BUN 29 (H) 02/02/2019   NA 138 02/02/2019   K 4.2 02/02/2019   CL 105 02/02/2019   CO2 25 02/02/2019    Anesthesia Physical Anesthesia Plan  ASA: II  Anesthesia Plan: General   Post-op Pain Management:  Regional for Post-op pain   Induction: Intravenous  PONV Risk Score and Plan: 3 and Treatment may vary due to age or medical condition, Dexamethasone and Ondansetron  Airway Management Planned: Oral ETT  Additional Equipment:   Intra-op Plan:   Post-operative Plan: Extubation in OR  Informed Consent: I have reviewed the patients History and Physical, chart, labs and discussed the procedure including the risks, benefits and alternatives for the proposed anesthesia with the patient or authorized representative who has indicated his/her understanding and acceptance.     Dental advisory  given  Plan Discussed with: CRNA  Anesthesia Plan Comments:        Anesthesia Quick Evaluation

## 2019-02-09 ENCOUNTER — Inpatient Hospital Stay (HOSPITAL_COMMUNITY)
Admission: RE | Admit: 2019-02-09 | Discharge: 2019-02-11 | DRG: 483 | Disposition: A | Discharge status: Home Health | Payer: Medicare Other | Attending: Orthopaedic Surgery | Admitting: Orthopaedic Surgery

## 2019-02-09 ENCOUNTER — Inpatient Hospital Stay (HOSPITAL_COMMUNITY): Payer: Medicare Other | Admitting: Certified Registered"

## 2019-02-09 ENCOUNTER — Encounter (HOSPITAL_COMMUNITY)
Admission: RE | Disposition: A | Discharge status: Home / Self Care | Payer: Self-pay | Source: Home / Self Care | Attending: Orthopaedic Surgery

## 2019-02-09 ENCOUNTER — Inpatient Hospital Stay (HOSPITAL_COMMUNITY): Payer: Medicare Other | Admitting: Physician Assistant

## 2019-02-09 ENCOUNTER — Encounter (HOSPITAL_COMMUNITY): Payer: Self-pay | Admitting: *Deleted

## 2019-02-09 ENCOUNTER — Inpatient Hospital Stay (HOSPITAL_COMMUNITY): Payer: Medicare Other

## 2019-02-09 DIAGNOSIS — T84038A Mechanical loosening of other internal prosthetic joint, initial encounter: Secondary | ICD-10-CM | POA: Diagnosis not present

## 2019-02-09 DIAGNOSIS — Z8249 Family history of ischemic heart disease and other diseases of the circulatory system: Secondary | ICD-10-CM | POA: Diagnosis not present

## 2019-02-09 DIAGNOSIS — K2100 Gastro-esophageal reflux disease with esophagitis, without bleeding: Secondary | ICD-10-CM | POA: Diagnosis present

## 2019-02-09 DIAGNOSIS — Z7982 Long term (current) use of aspirin: Secondary | ICD-10-CM | POA: Diagnosis not present

## 2019-02-09 DIAGNOSIS — K219 Gastro-esophageal reflux disease without esophagitis: Secondary | ICD-10-CM

## 2019-02-09 DIAGNOSIS — E785 Hyperlipidemia, unspecified: Secondary | ICD-10-CM | POA: Diagnosis present

## 2019-02-09 DIAGNOSIS — E119 Type 2 diabetes mellitus without complications: Secondary | ICD-10-CM | POA: Diagnosis present

## 2019-02-09 DIAGNOSIS — T8459XA Infection and inflammatory reaction due to other internal joint prosthesis, initial encounter: Principal | ICD-10-CM | POA: Diagnosis present

## 2019-02-09 DIAGNOSIS — Z09 Encounter for follow-up examination after completed treatment for conditions other than malignant neoplasm: Secondary | ICD-10-CM

## 2019-02-09 DIAGNOSIS — Z20828 Contact with and (suspected) exposure to other viral communicable diseases: Secondary | ICD-10-CM | POA: Diagnosis present

## 2019-02-09 DIAGNOSIS — Z809 Family history of malignant neoplasm, unspecified: Secondary | ICD-10-CM

## 2019-02-09 DIAGNOSIS — Z978 Presence of other specified devices: Secondary | ICD-10-CM

## 2019-02-09 DIAGNOSIS — Z96612 Presence of left artificial shoulder joint: Secondary | ICD-10-CM | POA: Diagnosis present

## 2019-02-09 DIAGNOSIS — Z79899 Other long term (current) drug therapy: Secondary | ICD-10-CM

## 2019-02-09 DIAGNOSIS — M659 Synovitis and tenosynovitis, unspecified: Secondary | ICD-10-CM | POA: Diagnosis present

## 2019-02-09 DIAGNOSIS — Y831 Surgical operation with implant of artificial internal device as the cause of abnormal reaction of the patient, or of later complication, without mention of misadventure at the time of the procedure: Secondary | ICD-10-CM | POA: Diagnosis present

## 2019-02-09 DIAGNOSIS — Z95828 Presence of other vascular implants and grafts: Secondary | ICD-10-CM | POA: Diagnosis not present

## 2019-02-09 DIAGNOSIS — M1990 Unspecified osteoarthritis, unspecified site: Secondary | ICD-10-CM

## 2019-02-09 DIAGNOSIS — Z87891 Personal history of nicotine dependence: Secondary | ICD-10-CM | POA: Diagnosis not present

## 2019-02-09 DIAGNOSIS — Z89232 Acquired absence of left shoulder: Secondary | ICD-10-CM | POA: Diagnosis not present

## 2019-02-09 DIAGNOSIS — Z888 Allergy status to other drugs, medicaments and biological substances status: Secondary | ICD-10-CM | POA: Diagnosis not present

## 2019-02-09 DIAGNOSIS — I10 Essential (primary) hypertension: Secondary | ICD-10-CM | POA: Diagnosis present

## 2019-02-09 DIAGNOSIS — Z881 Allergy status to other antibiotic agents status: Secondary | ICD-10-CM

## 2019-02-09 DIAGNOSIS — T8450XA Infection and inflammatory reaction due to unspecified internal joint prosthesis, initial encounter: Secondary | ICD-10-CM | POA: Diagnosis present

## 2019-02-09 HISTORY — PX: TOTAL SHOULDER REVISION: SHX6130

## 2019-02-09 LAB — SEDIMENTATION RATE: Sed Rate: 53 mm/hr — ABNORMAL HIGH (ref 0–22)

## 2019-02-09 LAB — GLUCOSE, CAPILLARY
Glucose-Capillary: 119 mg/dL — ABNORMAL HIGH (ref 70–99)
Glucose-Capillary: 169 mg/dL — ABNORMAL HIGH (ref 70–99)

## 2019-02-09 LAB — C-REACTIVE PROTEIN: CRP: 2.3 mg/dL — ABNORMAL HIGH (ref <1.0)

## 2019-02-09 SURGERY — REVISION, TOTAL ARTHROPLASTY, SHOULDER
Anesthesia: General | Site: Shoulder | Laterality: Left

## 2019-02-09 MED ORDER — FENTANYL CITRATE (PF) 100 MCG/2ML IJ SOLN
INTRAMUSCULAR | Status: AC
  Filled 2019-02-09: qty 2

## 2019-02-09 MED ORDER — SODIUM CHLORIDE 0.9 % IR SOLN
Status: DC | PRN
  Administered 2019-02-09 (×2): 1000 mL

## 2019-02-09 MED ORDER — BUPIVACAINE-EPINEPHRINE (PF) 0.5% -1:200000 IJ SOLN
INTRAMUSCULAR | Status: DC | PRN
  Administered 2019-02-09: 15 mL via PERINEURAL

## 2019-02-09 MED ORDER — EPHEDRINE 5 MG/ML INJ
INTRAVENOUS | Status: AC
  Filled 2019-02-09: qty 10

## 2019-02-09 MED ORDER — MIDAZOLAM HCL 5 MG/5ML IJ SOLN
INTRAMUSCULAR | Status: DC | PRN
  Administered 2019-02-09: 1 mg via INTRAVENOUS

## 2019-02-09 MED ORDER — DOCUSATE SODIUM 100 MG PO CAPS
100.0000 mg | ORAL_CAPSULE | Freq: Two times a day (BID) | ORAL | Status: DC
  Administered 2019-02-09 – 2019-02-11 (×4): 100 mg via ORAL
  Filled 2019-02-09 (×4): qty 1

## 2019-02-09 MED ORDER — CARVEDILOL 12.5 MG PO TABS
12.5000 mg | ORAL_TABLET | Freq: Two times a day (BID) | ORAL | Status: DC
  Administered 2019-02-09 – 2019-02-11 (×4): 12.5 mg via ORAL
  Filled 2019-02-09 (×4): qty 1

## 2019-02-09 MED ORDER — SODIUM CHLORIDE 0.9% FLUSH
10.0000 mL | Freq: Two times a day (BID) | INTRAVENOUS | Status: DC
  Administered 2019-02-09: 10 mL

## 2019-02-09 MED ORDER — PHENYLEPHRINE 40 MCG/ML (10ML) SYRINGE FOR IV PUSH (FOR BLOOD PRESSURE SUPPORT)
PREFILLED_SYRINGE | INTRAVENOUS | Status: AC
  Filled 2019-02-09: qty 10

## 2019-02-09 MED ORDER — IRBESARTAN 150 MG PO TABS
150.0000 mg | ORAL_TABLET | Freq: Every day | ORAL | Status: DC
  Administered 2019-02-09 – 2019-02-10 (×2): 150 mg via ORAL
  Filled 2019-02-09 (×3): qty 1

## 2019-02-09 MED ORDER — PHENYLEPHRINE 40 MCG/ML (10ML) SYRINGE FOR IV PUSH (FOR BLOOD PRESSURE SUPPORT)
PREFILLED_SYRINGE | INTRAVENOUS | Status: DC | PRN
  Administered 2019-02-09: 80 ug via INTRAVENOUS
  Administered 2019-02-09 (×4): 160 ug via INTRAVENOUS

## 2019-02-09 MED ORDER — PROMETHAZINE HCL 25 MG/ML IJ SOLN: 6.2500 mg | INTRAMUSCULAR | Status: DC | PRN

## 2019-02-09 MED ORDER — ONDANSETRON HCL 4 MG/2ML IJ SOLN
INTRAMUSCULAR | Status: DC | PRN
  Administered 2019-02-09: 4 mg via INTRAVENOUS

## 2019-02-09 MED ORDER — TRANEXAMIC ACID-NACL 1000-0.7 MG/100ML-% IV SOLN
1000.0000 mg | INTRAVENOUS | Status: AC
  Administered 2019-02-09: 1000 mg via INTRAVENOUS
  Filled 2019-02-09: qty 100

## 2019-02-09 MED ORDER — LACTATED RINGERS IV SOLN
INTRAVENOUS | Status: DC | PRN
  Administered 2019-02-09 (×2): via INTRAVENOUS

## 2019-02-09 MED ORDER — DIPHENHYDRAMINE HCL 12.5 MG/5ML PO ELIX: 12.5000 mg | ORAL_SOLUTION | ORAL | Status: DC | PRN

## 2019-02-09 MED ORDER — LIDOCAINE 2% (20 MG/ML) 5 ML SYRINGE
INTRAMUSCULAR | Status: DC | PRN
  Administered 2019-02-09: 20 mg via INTRAVENOUS

## 2019-02-09 MED ORDER — SUCCINYLCHOLINE CHLORIDE 200 MG/10ML IV SOSY
PREFILLED_SYRINGE | INTRAVENOUS | Status: AC
  Filled 2019-02-09: qty 10

## 2019-02-09 MED ORDER — VANCOMYCIN HCL 1 G IV SOLR
INTRAVENOUS | Status: DC | PRN
  Administered 2019-02-09: 1000 mg via TOPICAL

## 2019-02-09 MED ORDER — MIDAZOLAM HCL 2 MG/2ML IJ SOLN
INTRAMUSCULAR | Status: AC
  Filled 2019-02-09: qty 2

## 2019-02-09 MED ORDER — SUGAMMADEX SODIUM 200 MG/2ML IV SOLN
INTRAVENOUS | Status: DC | PRN
  Administered 2019-02-09: 150 mg via INTRAVENOUS

## 2019-02-09 MED ORDER — HYDROMORPHONE HCL 1 MG/ML IJ SOLN: 0.5000 mg | INTRAMUSCULAR | Status: DC | PRN

## 2019-02-09 MED ORDER — METOCLOPRAMIDE HCL 5 MG PO TABS: 5.0000 mg | ORAL_TABLET | Freq: Three times a day (TID) | ORAL | Status: DC | PRN

## 2019-02-09 MED ORDER — PROPOFOL 10 MG/ML IV BOLUS
INTRAVENOUS | Status: AC
  Filled 2019-02-09: qty 40

## 2019-02-09 MED ORDER — MINERAL OIL LIGHT OIL
TOPICAL_OIL | Status: AC
  Filled 2019-02-09: qty 10

## 2019-02-09 MED ORDER — 0.9 % SODIUM CHLORIDE (POUR BTL) OPTIME
TOPICAL | Status: DC | PRN
  Administered 2019-02-09: 1000 mL

## 2019-02-09 MED ORDER — BISACODYL 10 MG RE SUPP: 10.0000 mg | Freq: Every day | RECTAL | Status: DC | PRN

## 2019-02-09 MED ORDER — ONDANSETRON HCL 4 MG/2ML IJ SOLN
INTRAMUSCULAR | Status: AC
  Filled 2019-02-09: qty 2

## 2019-02-09 MED ORDER — POLYETHYLENE GLYCOL 3350 17 G PO PACK: 17.0000 g | PACK | Freq: Every day | ORAL | Status: DC | PRN

## 2019-02-09 MED ORDER — MENTHOL 3 MG MT LOZG: 1.0000 | LOZENGE | OROMUCOSAL | Status: DC | PRN

## 2019-02-09 MED ORDER — EPHEDRINE SULFATE-NACL 50-0.9 MG/10ML-% IV SOSY
PREFILLED_SYRINGE | INTRAVENOUS | Status: DC | PRN
  Administered 2019-02-09: 10 mg via INTRAVENOUS
  Administered 2019-02-09: 5 mg via INTRAVENOUS
  Administered 2019-02-09 (×2): 10 mg via INTRAVENOUS

## 2019-02-09 MED ORDER — ATORVASTATIN CALCIUM 10 MG PO TABS
10.0000 mg | ORAL_TABLET | Freq: Every day | ORAL | Status: DC
  Administered 2019-02-09 – 2019-02-10 (×2): 10 mg via ORAL
  Filled 2019-02-09 (×2): qty 1

## 2019-02-09 MED ORDER — ESMOLOL HCL 100 MG/10ML IV SOLN
INTRAVENOUS | Status: AC
  Filled 2019-02-09: qty 20

## 2019-02-09 MED ORDER — OXYCODONE HCL 5 MG PO TABS: 5.0000 mg | ORAL_TABLET | ORAL | Status: DC | PRN

## 2019-02-09 MED ORDER — MINERAL OIL LIGHT OIL
TOPICAL_OIL | Status: DC | PRN
  Administered 2019-02-09: 1 via TOPICAL

## 2019-02-09 MED ORDER — STERILE WATER FOR IRRIGATION IR SOLN
Status: DC | PRN
  Administered 2019-02-09: 2000 mL
  Administered 2019-02-09: 3000 mL

## 2019-02-09 MED ORDER — ESMOLOL HCL 100 MG/10ML IV SOLN
INTRAVENOUS | Status: DC | PRN
  Administered 2019-02-09: 15 mg via INTRAVENOUS

## 2019-02-09 MED ORDER — CHLORHEXIDINE GLUCONATE 4 % EX LIQD: 60.0000 mL | Freq: Once | CUTANEOUS | Status: DC

## 2019-02-09 MED ORDER — TOBRAMYCIN SULFATE 1.2 G IJ SOLR
INTRAMUSCULAR | Status: DC | PRN
  Administered 2019-02-09: 1.2 g via TOPICAL

## 2019-02-09 MED ORDER — PIPERACILLIN-TAZOBACTAM 3.375 G IVPB
3.3750 g | Freq: Three times a day (TID) | INTRAVENOUS | Status: DC
  Administered 2019-02-09: 3.375 g via INTRAVENOUS
  Filled 2019-02-09 (×2): qty 50

## 2019-02-09 MED ORDER — ROCURONIUM BROMIDE 10 MG/ML (PF) SYRINGE
PREFILLED_SYRINGE | INTRAVENOUS | Status: DC | PRN
  Administered 2019-02-09 (×3): 20 mg via INTRAVENOUS
  Administered 2019-02-09: 30 mg via INTRAVENOUS

## 2019-02-09 MED ORDER — TOBRAMYCIN SULFATE 1.2 G IJ SOLR
INTRAMUSCULAR | Status: AC
  Filled 2019-02-09: qty 1.2

## 2019-02-09 MED ORDER — PANTOPRAZOLE SODIUM 40 MG PO TBEC
40.0000 mg | DELAYED_RELEASE_TABLET | Freq: Every day | ORAL | Status: DC
  Administered 2019-02-10 – 2019-02-11 (×2): 40 mg via ORAL
  Filled 2019-02-09 (×2): qty 1

## 2019-02-09 MED ORDER — PHENYLEPHRINE HCL (PRESSORS) 10 MG/ML IV SOLN
INTRAVENOUS | Status: AC
  Filled 2019-02-09: qty 1

## 2019-02-09 MED ORDER — MAGNESIUM CITRATE PO SOLN: 1.0000 | Freq: Once | ORAL | Status: DC | PRN

## 2019-02-09 MED ORDER — CHLORHEXIDINE GLUCONATE CLOTH 2 % EX PADS
6.0000 | MEDICATED_PAD | Freq: Every day | CUTANEOUS | Status: DC
  Administered 2019-02-10 – 2019-02-11 (×3): 6 via TOPICAL

## 2019-02-09 MED ORDER — CEFAZOLIN SODIUM-DEXTROSE 2-4 GM/100ML-% IV SOLN
2.0000 g | INTRAVENOUS | Status: AC
  Administered 2019-02-09: 2 g via INTRAVENOUS
  Filled 2019-02-09: qty 100

## 2019-02-09 MED ORDER — ROCURONIUM BROMIDE 10 MG/ML (PF) SYRINGE
PREFILLED_SYRINGE | INTRAVENOUS | Status: AC
  Filled 2019-02-09: qty 10

## 2019-02-09 MED ORDER — ZOLPIDEM TARTRATE 5 MG PO TABS: 5.0000 mg | ORAL_TABLET | Freq: Every evening | ORAL | Status: DC | PRN

## 2019-02-09 MED ORDER — LACTATED RINGERS IV SOLN
INTRAVENOUS | Status: DC
  Administered 2019-02-09 (×2): via INTRAVENOUS

## 2019-02-09 MED ORDER — PROPOFOL 10 MG/ML IV BOLUS
INTRAVENOUS | Status: DC | PRN
  Administered 2019-02-09: 100 mg via INTRAVENOUS

## 2019-02-09 MED ORDER — FUROSEMIDE 40 MG PO TABS
40.0000 mg | ORAL_TABLET | Freq: Two times a day (BID) | ORAL | Status: DC
  Administered 2019-02-09 – 2019-02-11 (×4): 40 mg via ORAL
  Filled 2019-02-09 (×4): qty 1

## 2019-02-09 MED ORDER — METOCLOPRAMIDE HCL 5 MG/ML IJ SOLN: 5.0000 mg | Freq: Three times a day (TID) | INTRAMUSCULAR | Status: DC | PRN

## 2019-02-09 MED ORDER — DEXAMETHASONE SODIUM PHOSPHATE 10 MG/ML IJ SOLN
INTRAMUSCULAR | Status: AC
  Filled 2019-02-09: qty 1

## 2019-02-09 MED ORDER — HYDRALAZINE HCL 50 MG PO TABS
50.0000 mg | ORAL_TABLET | Freq: Two times a day (BID) | ORAL | Status: DC
  Administered 2019-02-09 – 2019-02-11 (×4): 50 mg via ORAL
  Filled 2019-02-09 (×4): qty 1

## 2019-02-09 MED ORDER — FENTANYL CITRATE (PF) 100 MCG/2ML IJ SOLN
INTRAMUSCULAR | Status: DC | PRN
  Administered 2019-02-09 (×2): 50 ug via INTRAVENOUS

## 2019-02-09 MED ORDER — PHENYLEPHRINE HCL-NACL 10-0.9 MG/250ML-% IV SOLN
INTRAVENOUS | Status: DC | PRN
  Administered 2019-02-09: 40 ug/min via INTRAVENOUS

## 2019-02-09 MED ORDER — FENTANYL CITRATE (PF) 100 MCG/2ML IJ SOLN: 25.0000 ug | INTRAMUSCULAR | Status: DC | PRN

## 2019-02-09 MED ORDER — PHENOL 1.4 % MT LIQD: 1.0000 | OROMUCOSAL | Status: DC | PRN

## 2019-02-09 MED ORDER — ONDANSETRON HCL 4 MG PO TABS
4.0000 mg | ORAL_TABLET | Freq: Four times a day (QID) | ORAL | Status: DC | PRN
  Administered 2019-02-09: 4 mg via ORAL
  Filled 2019-02-09: qty 1

## 2019-02-09 MED ORDER — ONDANSETRON HCL 4 MG/2ML IJ SOLN: 4.0000 mg | Freq: Four times a day (QID) | INTRAMUSCULAR | Status: DC | PRN

## 2019-02-09 MED ORDER — SODIUM CHLORIDE 0.9% FLUSH
10.0000 mL | INTRAVENOUS | Status: DC | PRN
  Administered 2019-02-11: 10 mL
  Filled 2019-02-09: qty 40

## 2019-02-09 MED ORDER — BUPIVACAINE LIPOSOME 1.3 % IJ SUSP
INTRAMUSCULAR | Status: DC | PRN
  Administered 2019-02-09: 10 mL via PERINEURAL

## 2019-02-09 MED ORDER — VANCOMYCIN HCL 1000 MG IV SOLR
INTRAVENOUS | Status: AC
  Filled 2019-02-09: qty 1000

## 2019-02-09 MED ORDER — ACETAMINOPHEN 500 MG PO TABS
1000.0000 mg | ORAL_TABLET | Freq: Three times a day (TID) | ORAL | Status: AC
  Administered 2019-02-09 – 2019-02-10 (×4): 1000 mg via ORAL
  Filled 2019-02-09 (×4): qty 2

## 2019-02-09 MED ORDER — LIDOCAINE 2% (20 MG/ML) 5 ML SYRINGE
INTRAMUSCULAR | Status: AC
  Filled 2019-02-09: qty 5

## 2019-02-09 MED ORDER — DEXAMETHASONE SODIUM PHOSPHATE 10 MG/ML IJ SOLN: INTRAMUSCULAR | Status: DC | PRN

## 2019-02-09 MED ORDER — VANCOMYCIN HCL IN DEXTROSE 1-5 GM/200ML-% IV SOLN
1000.0000 mg | Freq: Two times a day (BID) | INTRAVENOUS | Status: AC
  Administered 2019-02-09: 1000 mg via INTRAVENOUS
  Filled 2019-02-09: qty 200

## 2019-02-09 SURGICAL SUPPLY — 71 items
AID PSTN UNV HD RSTRNT DISP (MISCELLANEOUS) ×1
APL PRP STRL LF DISP 70% ISPRP (MISCELLANEOUS) ×2
BLADE EXTENDED COATED 6.5IN (ELECTRODE) IMPLANT
BLADE SAW SAG 73X25 THK (BLADE) ×2
BLADE SAW SGTL 73X25 THK (BLADE) ×1 IMPLANT
BONE CEMENT GENTAMICIN (Cement) ×6 IMPLANT
CEMENT BONE GENTAMICIN 40 (Cement) IMPLANT
CHLORAPREP W/TINT 26 (MISCELLANEOUS) ×6 IMPLANT
CLOSURE STERI-STRIP 1/2X4 (GAUZE/BANDAGES/DRESSINGS) ×1
CLOSURE WOUND 1/2 X4 (GAUZE/BANDAGES/DRESSINGS) ×1
CLSR STERI-STRIP ANTIMIC 1/2X4 (GAUZE/BANDAGES/DRESSINGS) ×2 IMPLANT
COOLER ICEMAN CLASSIC (MISCELLANEOUS) IMPLANT
COVER BACK TABLE 60X90IN (DRAPES) IMPLANT
COVER SURGICAL LIGHT HANDLE (MISCELLANEOUS) ×3 IMPLANT
COVER WAND RF STERILE (DRAPES) ×3 IMPLANT
DRAPE INCISE IOBAN 66X45 STRL (DRAPES) ×3 IMPLANT
DRAPE ORTHO SPLIT 77X108 STRL (DRAPES) ×6
DRAPE SHEET LG 3/4 BI-LAMINATE (DRAPES) ×3 IMPLANT
DRAPE SURG ORHT 6 SPLT 77X108 (DRAPES) ×2 IMPLANT
DRESSING AQUACEL AG SP 3.5X10 (GAUZE/BANDAGES/DRESSINGS) IMPLANT
DRSG AQUACEL AG ADV 3.5X 6 (GAUZE/BANDAGES/DRESSINGS) ×1 IMPLANT
DRSG AQUACEL AG SP 3.5X10 (GAUZE/BANDAGES/DRESSINGS) ×3
DRSG TEGADERM 4X4.75 (GAUZE/BANDAGES/DRESSINGS) ×2 IMPLANT
ELECT BLADE TIP CTD 4 INCH (ELECTRODE) ×3 IMPLANT
ELECT REM PT RETURN 15FT ADLT (MISCELLANEOUS) ×3 IMPLANT
EVACUATOR 1/8 PVC DRAIN (DRAIN) ×2 IMPLANT
GAUZE SPONGE 4X4 12PLY STRL (GAUZE/BANDAGES/DRESSINGS) ×2 IMPLANT
GLOVE BIO SURGEON STRL SZ 6.5 (GLOVE) ×4 IMPLANT
GLOVE BIO SURGEONS STRL SZ 6.5 (GLOVE) ×2
GLOVE BIOGEL PI IND STRL 6.5 (GLOVE) ×1 IMPLANT
GLOVE BIOGEL PI IND STRL 8 (GLOVE) ×2 IMPLANT
GLOVE BIOGEL PI INDICATOR 6.5 (GLOVE) ×2
GLOVE BIOGEL PI INDICATOR 8 (GLOVE) ×4
GLOVE ECLIPSE 8.0 STRL XLNG CF (GLOVE) ×6 IMPLANT
GOWN STRL REUS W/ TWL XL LVL3 (GOWN DISPOSABLE) ×1 IMPLANT
GOWN STRL REUS W/TWL LRG LVL3 (GOWN DISPOSABLE) ×3 IMPLANT
GOWN STRL REUS W/TWL XL LVL3 (GOWN DISPOSABLE) ×3
HANDPIECE INTERPULSE COAX TIP (DISPOSABLE) ×3
HEMOSTAT SURGICEL 2X14 (HEMOSTASIS) IMPLANT
JET LAVAGE IRRISEPT WOUND (IRRIGATION / IRRIGATOR) ×3
KIT BASIN OR (CUSTOM PROCEDURE TRAY) ×3 IMPLANT
KIT STABILIZATION SHOULDER (MISCELLANEOUS) ×3 IMPLANT
KIT TURNOVER KIT A (KITS) IMPLANT
LAVAGE JET IRRISEPT WOUND (IRRIGATION / IRRIGATOR) IMPLANT
MANIFOLD NEPTUNE II (INSTRUMENTS) ×3 IMPLANT
NDL HYPO 25X1 1.5 SAFETY (NEEDLE) IMPLANT
NDL MAYO CATGUT SZ4 TPR NDL (NEEDLE) IMPLANT
NEEDLE HYPO 25X1 1.5 SAFETY (NEEDLE) IMPLANT
NEEDLE MAYO CATGUT SZ4 (NEEDLE) IMPLANT
NS IRRIG 1000ML POUR BTL (IV SOLUTION) ×3 IMPLANT
PACK SHOULDER (CUSTOM PROCEDURE TRAY) ×3 IMPLANT
PAD COLD SHLDR WRAP-ON (PAD) IMPLANT
PENCIL SMOKE EVACUATOR (MISCELLANEOUS) IMPLANT
RESTRAINT HEAD UNIVERSAL NS (MISCELLANEOUS) ×3 IMPLANT
SET HNDPC FAN SPRY TIP SCT (DISPOSABLE) ×1 IMPLANT
SLING ULTRA III MED (ORTHOPEDIC SUPPLIES) ×3 IMPLANT
SPONGE LAP 18X18 X RAY DECT (DISPOSABLE) IMPLANT
STRIP CLOSURE SKIN 1/2X4 (GAUZE/BANDAGES/DRESSINGS) ×1 IMPLANT
SUCTION FRAZIER HANDLE 12FR (TUBING) ×2
SUCTION TUBE FRAZIER 12FR DISP (TUBING) ×1 IMPLANT
SUT ETHIBOND 2 V 37 (SUTURE) ×3 IMPLANT
SUT ETHIBOND NAB CT1 #1 30IN (SUTURE) ×3 IMPLANT
SUT ETHILON 2 0 PS N (SUTURE) ×8 IMPLANT
SUT FIBERWIRE #5 38 CONV NDL (SUTURE) ×9
SUT MNCRL AB 4-0 PS2 18 (SUTURE) ×1 IMPLANT
SUT PDS AB 2-0 CT2 27 (SUTURE) ×2 IMPLANT
SUT VIC AB 0 CT1 36 (SUTURE) ×1 IMPLANT
SUTURE FIBERWR #5 38 CONV NDL (SUTURE) ×4 IMPLANT
TOWEL OR 17X26 10 PK STRL BLUE (TOWEL DISPOSABLE) ×3 IMPLANT
WATER STERILE IRR 1000ML POUR (IV SOLUTION) ×6 IMPLANT
YANKAUER SUCT BULB TIP NO VENT (SUCTIONS) ×2 IMPLANT

## 2019-02-09 NOTE — Interval H&P Note (Signed)
All questions were answered. Patient is scheduled for an explantation and anabiotic spacer placement. We will have the taxes you see the patient afterwards.

## 2019-02-09 NOTE — Anesthesia Procedure Notes (Signed)
Anesthesia Regional Block: Interscalene brachial plexus block   Pre-Anesthetic Checklist: ,, timeout performed, Correct Patient, Correct Site, Correct Laterality, Correct Procedure, Correct Position, site marked, Risks and benefits discussed,  Surgical consent,  Pre-op evaluation,  At surgeon's request and post-op pain management  Laterality: Left  Prep: chloraprep       Needles:  Injection technique: Single-shot  Needle Type: Echogenic Stimulator Needle     Needle Length: 9cm  Needle Gauge: 21     Additional Needles:   Procedures:, nerve stimulator,,, ultrasound used (permanent image in chart),,,,   Nerve Stimulator or Paresthesia:  Response: deltoid, 0.5 mA,   Additional Responses:   Narrative:  Start time: 02/09/2019 7:57 AM End time: 02/09/2019 8:04 AM Injection made incrementally with aspirations every 5 mL.  Performed by: Personally  Anesthesiologist: Suzette Battiest, MD

## 2019-02-09 NOTE — Transfer of Care (Signed)
Immediate Anesthesia Transfer of Care Note  Patient: Tina Frank  Procedure(s) Performed: TOTAL SHOULDER REVISION (Left Shoulder)  Patient Location: PACU  Anesthesia Type:GA combined with regional for post-op pain  Level of Consciousness: drowsy, patient cooperative and responds to stimulation  Airway & Oxygen Therapy: Patient Spontanous Breathing and Patient connected to face mask oxygen  Post-op Assessment: Report given to RN and Post -op Vital signs reviewed and stable  Post vital signs: Reviewed and stable  Last Vitals:  Vitals Value Taken Time  BP 108/90 02/09/19 1110  Temp    Pulse 69 02/09/19 1111  Resp 28 02/09/19 1111  SpO2 100 % 02/09/19 1111  Vitals shown include unvalidated device data.  Last Pain:  Vitals:   02/09/19 0722  TempSrc:   PainSc: 0-No pain      Patients Stated Pain Goal: 3 ([phone] [id number])  Complications: No apparent anesthesia complications

## 2019-02-09 NOTE — Op Note (Signed)
Orthopaedic Surgery Operative Note (CSN: [id number])  Tina Frank  April 22, 1940 Date of Surgery: 02/09/2019   Diagnoses:  LEFT SHOULDER INFECTION  Procedure: Left shoulder explant of infected total shoulder Neurolysis of axillary nerve Decompression of deep abscess I&D Left hemiarthroplasty with antibiotic spacer   Operative Finding Successful completion of planned procedure.  Patient had gross pus that appeared to be chronic both around the implants with a completely loose glenoid component that was moving around the remainder of the joint.  The proximal aspect of the cancellous bone in the humerus was all eroded away and the patient had just a shallow tuberosity left.  There is a pseudocapsule around this area.  There is significant purulence and synovitis throughout the remainder of the shoulder joint.  Unfortunately there was a deep track past the axillary nerve to a separate deep abscess and we isolated the axillary nerve and were able to open this abscess and developed a plane which had significant purulence.  About 500 to 700 cc of fluid with purulent material came out of this area.  This was clearly there for an extended period of time.  The overall appearance of the shoulder was that of a chronic infection and the purulent material was rather old appearing and a bit congealed.  We performed a extensive synovectomy and removal of all hardware with incision debridement of this abscess.  Hemiarthroplasty with a custom-made spacer of antibiotic cement was performed and there was repair of the subscapularis and the remainder of the cuff appeared relatively intact.  The patient has further infection she may need further washout of the left the deep drain will stay for 24 to 48 hours.  Infectious disease was consulted.  The glenoid was essentially completely eroded and there was only a small amount of scapular body remaining was really not involved that was amenable to reimplantation of a reverse  total shoulder.  If she wanted a reimplantation of a hemiarthroplasty that would be sufficient however I do not think there is a sufficient bone for a glenoid component.  Post-operative plan: The patient will be NWB in sling for 4 weeks with gentle therapy afterwards but really very little therapy necessary.  The patient will be admitted overnight with infectious disease consult.  DVT prophylaxis not indicated in isolated upper extremity surgery patient with no specific risks factors.  Pain control with PRN pain medication preferring oral medicines.  Follow up plan will be scheduled in approximately 7 days for incision check and XR.  Physical therapy will likely start at 4 weeks and be minimal for this patient  Implants: Explanted Stryker total shoulder implant stem, head and polyethylene.  Implanted a Tornier custom antibiotic spacer hemiarthroplasty component  Post-Op Diagnosis: Same Surgeons:Primary: Hiram Gash, MD Assistants:Caroline McBane PA-C [facility] ROOM 06 Anesthesia: General with Exparel Interscalene Antibiotics: Ancef 2g preop, Vancomycin 1000mg , tobramycin 1.2 g locally, antibiotics and spacer include gentamicin Tourniquet time: None Estimated Blood Loss: [id number] Complications: None Specimens: 3 for culture all being held for 3 weeks to rule out C.acnes Implants: Implant Name Type Inv. Item Serial No. Manufacturer Lot No. LRB No. Used Action  BONE CEMENT GENTAMICIN - [id number] Cement BONE CEMENT GENTAMICIN  DEPUY SYNTHES [id number] Left 2 Implanted    Indications for Surgery:   Tina Frank is a 78 y.o. female with infected left total shoulder arthroplasty performed years ago by one of my partners who is since retired.  Due to the patient's preferences given with a high suspicion of  infection she wanted to try antibiotic suppression for about a year.  She had worsening symptoms and felt that she no longer go without surgery as her pain was increasing.  She had a large fluid  collection that is been drained in the posterior aspect of the shoulder which confirmed that there was an infection via Synovasure testing but no organism was obtained.  Benefits and risks of operative and nonoperative management were discussed prior to surgery with patient/guardian(s) and informed consent form was completed.  Infection and need for further surgery were discussed as was prosthetic stability and cuff issues.  We additionally specifically discussed risks of axillary nerve injury, infection, periprosthetic fracture, continued pain and longevity of implants prior to beginning procedure.      Procedure:   The patient was identified in the preoperative holding area where the surgical site was marked. Block placed by anesthesia with exparel.  The patient was taken to the OR where a procedural timeout was called and the above noted anesthesia was induced.  The patient was positioned beachchair on allen table with spider arm positioner.  Preoperative antibiotics were dosed.  The patient's left shoulder was prepped and draped in the usual sterile fashion.  A second preoperative timeout was called.       We began with the incision using lysing her old deltopectoral incision.  We went through skin sharply achieving hemostasis we progressed.  We identified the deltopectoral interval which was significantly scarred down to the cephalic vein to been damaged likely at her first surgery.  We open this interval and noted that the conjoined tendon the subscapularis remnant were scarred together.  We carefully open this interval with blunt dissection using a Metzenbaum scissor.  At this point we are able to place our Covell retractor.  We noted that her rotator cuff appeared to be mostly intact and the subscapularis was taken down in a peel.  The leading edge of the supraspinatus was relatively thin however it was intact.  We performed a subscapularis peel and use type sutures to hold it in place to manipulate  it.  Actually nerve was palpated and found and visualized and noted to be intact.  We actually neurolysed the axillary nerve to allow for exposure throughout the case in the subdeltoid region.    This point we noted immediate purulent congealed material consistent with a chronic infection which was caked underneath the implants humeral head.  A tuning fork device was used to remove the humeral head and more purulent material in this area and there was essentially a shell of cortical bone that was remaining that the rotator cuff was attached to and all cancellous bone had been lost.  We carefully used an osteotome to free the proximal ingrowth portion of the stem and were able to use a Stryker broach handle to extract the stem without any significant bone loss.  This point we used a curette, rondure to excisionally debride purulent material, unhealthy appearing tissue, bone and biofilm type material from the humeral canal.  Were happy with our surgical debridement and irrigated this area with 3 L of normal saline.  This point we used broaches from the trial set to broach to a size 4 using a Tornier long flex stem and placed a cut protector to protect our proximal bone and avoid fracture as we continued our approach to the glenoid.  Subscapularis was mobilized again we performed a release of the inferior border while taking care to protect the axillary nerve  inferiorly.  We then were able to retract this and placed an anterior glenoid retractor.  There was significant synovitis and purulent material all around the glenoid.  The glenoid component was completely loose and was 90 degrees to the face of the remnant of the actual glenoid.  There was significant bony loss and there was very little glenoid involvement remaining.  We used a rondure as well as a pituitary and a curette to excisionally debride unhealthy appearing tissue and purulent material as well as bone from the glenoid side of the replacement.  Obviously  the total shoulder polyethylene was removed.  Significant synovitis was noted and we took care to protect the axillary nerve and remove all of this material back to a healthy appearing wound bed.  At that point we took care to note that the glenoid was really not amenable to reimplantation as there is very little bone left and we would need coracoid fixation likely to successfully place a reverse social arthroplasty.  We irrigated the glenoid side with 3 L of normal saline and then we identified that the patient had had this large pocket of fluid that was posterior around the triceps and we took care to identify this.  Initially with squeezing this area we are not able to get any fluid to express however we then went back towards the axillary nerve and between the interval between the axillary nerve and the radial nerve were able to carefully dissect bluntly and used blunt retractors to protect her nerve and vascular structures.  We then found a pseudocapsule and were able to open this by excisionally opening a large area of the capsule itself.  Immediately we were encountered by lots of unhealthy appearing fluid with purulent material interspersed.  We are able to express at least 500 to 700 mL of fluid which was all suctioned out.  We used a combination of a rondure as well as a curette to try and clear the pseudocapsule as best we could.  Deltopectoral approach and remove any unhealthy tissue.  This was a deep abscess separate from our normal surgery.  This was extremely difficult to do through a deltopectoral approach we felt that opening posteriorly would have caused significant morbidity to the patient.  We went back and removed our humeral implant trial afterwards selected a size 43 head.  This point we irrigated copiously in both the abscess as well as the remainder of the shoulder.  Irrisept solution was placed for 60 seconds before being suctioned and irrigated again with NS throughout.  We then  carefully placed a Hemovac drain with 20 holes in the tube into the abscess and followed this up into the area of the shoulder taking care to note that it was beneath the axillary nerve and would not cause any issues with this.  We felt that appropriate subscapularis closure would be reasonable for this patient as she has expressed a desire not to have another surgery and that we cannot recommend this given her reasonable function of the ideal.  We prepped the canal by placing three #5 FiberWire sutures for subscapularis closure within the case.  We then used 2 batches of gentamicin cement to create our hemiarthroplasty component using a Tornier molds.  We used a size 4 stem and a 43 head and placed the cement within the molds and monitor carefully before prepping this implant with a rasp as well as rondure to ensure a smooth surface for articulation with the glenoid remnant.  This  point the implant was placed without issue and we were able to repair the subscapularis with our FiberWire's.  We used PDS suture to reapproximate the deep layers of skin just to prevent wound dehiscence using only 3 of the sutures.  We did place 3-4 1 Ethibond sutures in the deltopectoral interval for landmarks in case we need to come back into this case.  Skin was closed with nylon sutures and a bulky dressing was placed.  Patient was awoken taken back in stable condition.  Noemi Chapel, PA-C, present and scrubbed throughout the case, critical for completion in a timely fashion, and for retraction, instrumentation, closure.

## 2019-02-09 NOTE — Progress Notes (Signed)
Pharmacy Antibiotic Note  Tina Frank is a 78 y.o. 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  Pharmacy is consulted to start zosyn post-op for infection.   Plan: - zosyn 3.375 gm IV q8h (infuse over 4 hrs)  _____________________________________  Height: 5\' 4"  (162.6 cm) Weight: 132 lb (59.9 kg) IBW/kg (Calculated) : 54.7  Temp (24hrs), Avg:98 F (36.7 C), Min:97.3 F (36.3 C), Max:98.7 F (37.1 C)  No results for input(s): WBC, CREATININE, LATICACIDVEN, VANCOTROUGH, VANCOPEAK, VANCORANDOM, GENTTROUGH, GENTPEAK, GENTRANDOM, TOBRATROUGH, TOBRAPEAK, TOBRARND, AMIKACINPEAK, AMIKACINTROU, AMIKACIN in the last 168 hours.  Estimated Creatinine Clearance: 48.4 mL/min (by C-G formula based on SCr of 0.8 mg/dL).    Allergies  Allergen Reactions  . Crestor [Rosuvastatin] Other (See Comments)    Muscle aches  . Norvasc [Amlodipine Besylate] Swelling    Ankle swelling  . Simvastatin Other (See Comments)    Muscle aches  . Sulfa Antibiotics Rash  . Zetia [Ezetimibe] Other (See Comments)    Muscle aches    Thank you for allowing pharmacy to be a part of this patient's care.  Lynelle Doctor 02/09/2019 1:36 PM

## 2019-02-09 NOTE — Anesthesia Procedure Notes (Signed)
Procedure Name: Intubation Date/Time: 02/09/2019 8:37 AM Performed by: Silas Sacramento, CRNA Pre-anesthesia Checklist: Patient identified, Emergency Drugs available, Suction available and Patient being monitored Patient Re-evaluated:Patient Re-evaluated prior to induction Oxygen Delivery Method: Circle system utilized Preoxygenation: Pre-oxygenation with 100% oxygen Induction Type: IV induction Ventilation: Mask ventilation without difficulty Laryngoscope Size: Mac and 4 Grade View: Grade I Tube type: Oral Tube size: 7.0 mm Number of attempts: 1 Airway Equipment and Method: Stylet Placement Confirmation: ETT inserted through vocal cords under direct vision,  positive ETCO2 and breath sounds checked- equal and bilateral Secured at: 22 cm Tube secured with: Tape Dental Injury: Teeth and Oropharynx as per pre-operative assessment

## 2019-02-09 NOTE — Consult Note (Signed)
[facility]  Total days of antibiotics 1       Reason for Consult:prosthetic shoulder infection   Referring Physician: varkey  Active Problems:   Prosthetic joint infection ([place])    HPI: Tina Frank is a 78 y.o. female with hx of HTN, GERD, HLD, OA, hx of left shoulder replacement in 2016. She has had pain with since 2019 in setting of elevated inflammatory markers, and CT imaging suggesting loosening of hardware. She had culture negative aspirate cx in Oct 2019. She chose to do chronic oral suppression with cephalexin instead of surgery. MRI this past fall shows 12 x 8.5 x 7.3cm of largely cystic lesion communicating to joint. She underwent repeat aspiration was again NGTD- she did decide to undergo implant resection. Dr Griffin Basil resected the prosthesis, debrided unhealth tissue and placed abtx spacer. She has cultures pending.   Past Medical History:  Diagnosis Date   Arthritis    "probably in my shoulders and left hip" (02/14/2015)   GERD (gastroesophageal reflux disease)    hx   Hyperlipidemia    Hypertension    Obesity    is resolve- pt. highest weight was 180 10 yrs ago.   Reflux esophagitis    Type II diabetes mellitus (HCC)     Allergies:  Allergies  Allergen Reactions   Crestor [Rosuvastatin] Other (See Comments)    Muscle aches   Norvasc [Amlodipine Besylate] Swelling    Ankle swelling   Simvastatin Other (See Comments)    Muscle aches   Sulfa Antibiotics Rash   Zetia [Ezetimibe] Other (See Comments)    Muscle aches     MEDICATIONS:  acetaminophen  1,000 mg Oral Q8H   atorvastatin  10 mg Oral QHS   carvedilol  12.5 mg Oral BID WC   docusate sodium  100 mg Oral BID   furosemide  40 mg Oral BID   hydrALAZINE  50 mg Oral BID   irbesartan  150 mg Oral Daily   [START ON 02/10/2019] pantoprazole  40 mg Oral Daily    Social History   Tobacco Use   Smoking status: Former Smoker    Packs/day: 1.00    Years:  20.00    Pack years: 20.00    Types: Cigarettes    Quit date: 10/28/1987    Years since quitting: 31.3   Smokeless tobacco: Never Used   Tobacco comment: quit smoking > 25 years ago.  Substance Use Topics   Alcohol use: No   Drug use: No    Family History  Problem Relation Age of Onset   Heart failure Mother    Cancer Mother    Cancer Father    Breast cancer Neg Hx      Review of Systems  Constitutional: Negative for fever, chills, diaphoresis, activity change, appetite change, fatigue and unexpected weight change.  HENT: Negative for congestion, sore throat, rhinorrhea, sneezing, trouble swallowing and sinus pressure.  Eyes: Negative for photophobia and visual disturbance.  Respiratory: Negative for cough, chest tightness, shortness of breath, wheezing and stridor.  Cardiovascular: Negative for chest pain, palpitations and leg swelling.  Gastrointestinal: Negative for nausea, vomiting, abdominal pain, diarrhea, constipation, blood in stool, abdominal distention and anal bleeding.  Genitourinary: no dysuria Msk: left shoulder pain Skin: Negative for color change, pallor, rash and wound.  Neurological: Negative for dizziness, tremors, weakness and light-headedness.  Hematological: Negative for adenopathy. Does not bruise/bleed easily.  Psychiatric/Behavioral: Negative for behavioral problems, confusion, sleep disturbance, dysphoric mood,  decreased concentration and agitation.    OBJECTIVE: Temp:  [97.3 F (36.3 C)-98.7 F (37.1 C)] 97.3 F (36.3 C) (12/02 1110) Pulse Rate:  [56-69] 56 (12/02 1548) Resp:  [12-18] 16 (12/02 1548) BP: (96-140)/(47-90) 104/57 (12/02 1548) SpO2:  [90 %-100 %] 99 % (12/02 1548) Weight:  [59.9 kg] 59.9 kg (12/02 0722) Physical Exam  Constitutional:  oriented to person, place, and time. appears well-developed and well-nourished. No distress.  HENT: [place]/AT, PERRLA, no scleral icterus, wearing supplemental oxygen Mouth/Throat: Oropharynx  is clear and moist. No oropharyngeal exudate.  Cardiovascular: Normal rate, regular rhythm and normal heart sounds. Exam reveals no gallop and no friction rub.  No murmur heard.  Pulmonary/Chest: Effort normal and breath sounds normal. No respiratory distress.  has no wheezes.  MPN:TIRW shoulder with cooling contraption in place/wrapped/sling in place with accordion drain Abdominal: Soft. Bowel sounds are normal.  exhibits no distension. There is no tenderness.  Lymphadenopathy: no cervical adenopathy. No axillary adenopathy Neurological: alert and oriented to person, place, and time.  Skin: Skin is warm and dry. No rash noted. No erythema.  Psychiatric: a normal mood and affect.  behavior is normal.    LABS: Results for orders placed or performed during the hospital encounter of 02/09/19 (from the past 48 hour(s))  Glucose, capillary     Status: Abnormal   Collection Time: 02/09/19  7:31 AM  Result Value Ref Range   Glucose-Capillary 119 (H) 70 - 99 mg/dL  Aerobic/Anaerobic Culture (surgical/deep wound)     Status: None (Preliminary result)   Collection Time: 02/09/19  9:11 AM   Specimen: Soft Tissue, Other  Result Value Ref Range   Specimen Description      TISSUE LT SHOULDER A Performed at [facility] [address], [place], [place] [address]    Special Requests      PATIENT ON FOLLOWING ANCEF Performed at [facility], [place] [address], [place], [place] [address]    Gram Stain      FEW WBC PRESENT, PREDOMINANTLY PMN NO ORGANISMS SEEN Performed at [facility], [place] [address] [place], [place] [address]    Culture PENDING    Report Status PENDING   Aerobic/Anaerobic Culture (surgical/deep wound)     Status: None (Preliminary result)   Collection Time: 02/09/19  9:15 AM   Specimen: Soft Tissue, Other  Result Value Ref Range   Specimen Description      TISSUE LT SHOULDER B Performed at [facility]  [address], [place], [place] [address]    Special Requests      PATIENT ON FOLLOWING ANCEF Performed at [facility], [place] [address], [place], [place] [address]    Gram Stain      RARE WBC PRESENT, PREDOMINANTLY MONONUCLEAR NO ORGANISMS SEEN Performed at [facility], [place] [address], [place], [place] [address]    Culture PENDING    Report Status PENDING   Aerobic/Anaerobic Culture (surgical/deep wound)     Status: None (Preliminary result)   Collection Time: 02/09/19  9:17 AM   Specimen: Soft Tissue, Other  Result Value Ref Range   Specimen Description      TISSUE LT SHOULDER C Performed at [facility] [address] [place], [place] [address]    Special Requests      NONE Performed at [facility], [place] [address] [place],  [address]    Gram Stain  FEW WBC PRESENT, PREDOMINANTLY PMN NO ORGANISMS SEEN Performed at [facility] [address] [place], [place] [address]    Culture PENDING    Report Status PENDING   Aerobic/Anaerobic Culture (surgical/deep wound)     Status: None (Preliminary result)   Collection Time: 02/09/19  9:33 AM   Specimen: Soft Tissue, Other  Result Value Ref Range   Specimen Description      TISSUE LT SHOULDER D Performed at [facility] [address], [place], [place] [address]    Special Requests      PATIENT ON FOLLOWING ANCEF Performed at [facility], [place] [address], [place], [place] [address]    Gram Stain      FEW WBC PRESENT, PREDOMINANTLY PMN NO ORGANISMS SEEN Performed at [facility], [place] [address], [place],  [address]    Culture PENDING    Report Status PENDING   Glucose, capillary     Status: Abnormal   Collection Time: 02/09/19 11:11 AM  Result Value Ref Range   Glucose-Capillary 169 (H) 70 - 99 mg/dL   Lab Results  Component Value Date   ESRSEDRATE 31 (H) 09/07/2018     MICRO: reviewed IMAGING: Dg Shoulder Left Port  Result Date: 02/09/2019 CLINICAL DATA:  Left shoulder revision arthroplasty. EXAM: LEFT SHOULDER COMPARISON:  02/14/2015 FINDINGS: Postoperative change from left shoulder revision arthroplasty identified. The hardware components are in anatomic alignment. There is surrounding soft tissue swelling, subcutaneous emphysema and surgical drain identified IMPRESSION: Status post left shoulder revision arthroplasty. Electronically Signed   By: Kerby Moors M.D.   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 EXAM: ULTRASOUND AND FLUOROSCOPIC GUIDED PICC LINE INSERTION MEDICATIONS: 1% lidocaine to skin and subcutaneous tissue ANESTHESIA/SEDATION: None FLUOROSCOPY TIME:  Fluoroscopy Time:  48 seconds (6 mGy). COMPLICATIONS: None immediate. TECHNIQUE: The procedure, risks, benefits, and alternatives were explained to the patient and informed written consent was obtained. A timeout was performed prior to the initiation of the procedure. The right upper extremity was prepped with chlorhexidine in a sterile fashion, and a sterile drape was applied covering the operative field. Maximum barrier sterile technique with sterile gowns and gloves were used for the procedure. A timeout was performed prior to the initiation of the procedure. Local anesthesia was provided with 1% lidocaine. Under direct ultrasound guidance, the right basilic vein was accessed with a micropuncture kit after the overlying soft tissues were anesthetized with 1% lidocaine. An ultrasound image was saved for documentation purposes. A guidewire was advanced to the level of the superior caval-atrial junction for measurement purposes and the PICC line  was cut to length. A peel-away sheath was placed and a 40 cm, 5 [place], dual lumen was inserted to level of the superior caval-atrial junction. A post procedure spot fluoroscopic was obtained. The catheter easily aspirated and flushed and was sutured in place. A dressing was placed. The patient tolerated the procedure well without immediate post procedural complication. FINDINGS: After catheter placement, the tip lies within the superior cavoatrial junction. the catheter aspirates and flushes normally and is ready for immediate use. IMPRESSION: Successful ultrasound and fluoroscopic guided placement of a right basilic vein approach, 40 cm, 5 French, dual lumen PICC with tip at the superior  caval-atrial junction. The PICC line is ready for immediate use. Read by: Rowe Robert, PA-C Electronically Signed   By: Sandi Mariscal M.D.   On: 02/08/2019 16:36     Assessment/Plan:  78yo F with chronic left shoulder pji s/p explantation with abtx spacer placement. - will check sed rate and crp - will also change abtx to vancomycin plus ceftriaxone and await culture results to narrow for her final abtx course to do when she is discharged - anticipate  6 wk treatment course to include coverage for c.acnes which takes longer to isolate from culture  [facility] [phone]

## 2019-02-10 ENCOUNTER — Encounter (HOSPITAL_COMMUNITY): Payer: Self-pay | Admitting: Orthopaedic Surgery

## 2019-02-10 MED ORDER — SODIUM CHLORIDE 0.9 % IV SOLN
2.0000 g | INTRAVENOUS | Status: DC
  Administered 2019-02-10 – 2019-02-11 (×2): 2 g via INTRAVENOUS
  Filled 2019-02-10 (×2): qty 2

## 2019-02-10 MED ORDER — CALCIUM CARBONATE ANTACID 500 MG PO CHEW
1.0000 | CHEWABLE_TABLET | Freq: Two times a day (BID) | ORAL | Status: DC
  Administered 2019-02-10 (×2): 400 mg via ORAL
  Filled 2019-02-10: qty 1
  Filled 2019-02-10: qty 2
  Filled 2019-02-10: qty 1

## 2019-02-10 MED ORDER — ACETAMINOPHEN 500 MG PO TABS
1000.0000 mg | ORAL_TABLET | Freq: Three times a day (TID) | ORAL | Status: DC
  Administered 2019-02-10 – 2019-02-11 (×2): 1000 mg via ORAL
  Filled 2019-02-10 (×2): qty 2

## 2019-02-10 MED ORDER — VANCOMYCIN HCL IN DEXTROSE 1-5 GM/200ML-% IV SOLN
1000.0000 mg | INTRAVENOUS | Status: DC
  Administered 2019-02-10 – 2019-02-11 (×2): 1000 mg via INTRAVENOUS
  Filled 2019-02-10 (×2): qty 200

## 2019-02-10 NOTE — Progress Notes (Signed)
ID PROGRESS NOTE   Cultures remain negative. Patient is afebrile.   Await another day to see if anything isolated on culture  Would recommend 6 wk of vancomycin plus ceftriaxone 2gm IV daily per protocol if no further cultures are positive To treat shoulder joint infection  Will see in the morning on Friday  Tina [facility] for Infectious Diseases [phone]

## 2019-02-10 NOTE — Progress Notes (Signed)
ORTHOPAEDIC PROGRESS NOTE  s/p Procedure(s): TOTAL SHOULDER REVISION  SUBJECTIVE: Patient is sitting up comfortably in her hospital bed watching tv. She reports minimal pain about operative site. No chest pain. No SOB. No nausea/vomiting. No other complaints.  OBJECTIVE: PE: Dressing CDI and sling well fitting.  hemovac drain in place. full and painless ROM throughout hand with [facility] of 1. + Motor in  AIN, PIN, Ulnar distributions. Axillary nerve sensation preserved.  Sensation intact in medial, radial, and ulnar distributions. Well perfused digits.    Vitals:   02/10/19 0042 02/10/19 0537  BP: 104/76 (!) 134/58  Pulse: (!) 53 61  Resp: 16 16  Temp: 97.9 F (36.6 C) 97.7 F (36.5 C)  SpO2: 100% 100%     ASSESSMENT: Tina Frank is a 78 y.o. female doing well postoperatively.  PLAN: Weightbearing: NWB LUE Insicional and dressing care: Reinforce dressings as needed Orthopedic device(s): Sling. Hemovac drain Showering: Post op day #2 with assistance VTE prophylaxis: SCDs.  Pain control: PRN pain medications. Preferring oral medications. Minimize narcotics as able.  Follow - up plan: 1 week in office for incision check and x-ray Dispo: Plan for Friday 12/4 discharge home. Drain to be removed tomorrow.   Noemi Chapel, PA-C 02/10/19

## 2019-02-10 NOTE — Plan of Care (Signed)
  Problem: Education: Goal: Knowledge of the prescribed therapeutic regimen will improve Outcome: Progressing   Problem: Activity: Goal: Ability to tolerate increased activity will improve Outcome: Progressing   Problem: Pain Management: Goal: Pain level will decrease with appropriate interventions Outcome: Progressing   Problem: Clinical Measurements: Goal: Respiratory complications will improve Outcome: Progressing   Problem: Elimination: Goal: Will not experience complications related to urinary retention Outcome: Progressing   Problem: Pain Managment: Goal: General experience of comfort will improve Outcome: Progressing   Problem: Safety: Goal: Ability to remain free from injury will improve Outcome: Progressing

## 2019-02-10 NOTE — TOC Initial Note (Signed)
Transition of Care [facility]) - Initial/Assessment Note    Patient Details  Name: Tina Frank MRN: [id number] Date of Birth: 11-14-1940  [facility] CM/SW Contact:    Lia Hopping, [place] Phone Number: 02/10/2019, 4:19 PM  Clinical Narrative:                 CSW met with the patient at bedside to discuss options for home health RN and aide for continued IV infusion and care. CSW explain the Home Health process. Patient reports understanding. Patient prefers to use a Home Health agency in network with the patient insurance. Patient agreeable to [facility] for RN and aide services.  Patient express she may need additional support at home since sons will not be able to help get dress in the morning or assist with other house chores. Patient tearful about having to ask her sons for support. Patient reports she would like for someone to come in for an hour to assist with her care. CSW informed the patient about personal care services in the community. She will  discuss personal care offers with her sons.   CSW notified Pam with [facility], IV infusion. She will continue to coordinate the patient care.  TOC staff will continue to follow for discharge needs.   Expected Discharge Plan: [place] Barriers to Discharge: No Barriers Identified   Patient Goals and CMS Choice Patient states their goals for this hospitalization and ongoing recovery are:: For my arm to heal CMS Medicare.gov Compare Post Acute Care list provided to:: Patient Choice offered to / list presented to : Patient  Expected Discharge Plan and Services Expected Discharge Plan: [place] In-house Referral: Clinical Social Work   Post Acute Care Choice: [place] arrangements for the past 2 months: [place]: Tour manager, Other - See comment([facility]) Date [place]: 02/10/19      Prior Living  Arrangements/Services Living arrangements for the past 2 months: [place] Lives with:: Self Patient language and need for interpreter reviewed:: Yes Do you feel safe going back to the place where you live?: Yes      Need for Family Participation in Patient Care: Yes (Comment) Care giver support system in place?: Yes (comment) Current home services: Homehealth aide, Home RN Criminal Activity/Legal Involvement Pertinent to Current Situation/Hospitalization: No - Comment as needed  Activities of Daily Living Home Assistive Devices/Equipment: [place] (specify type), Cane (specify quad or straight), Bedside commode/3-in-1, Eyeglasses, Grab bars in shower, Blood pressure cuff, Hand-held shower hose ADL Screening (condition at time of admission) Patient's cognitive ability adequate to safely complete daily activities?: Yes Is the patient deaf or have difficulty hearing?: No Does the patient have difficulty seeing, even when wearing glasses/contacts?: No Does the patient have difficulty concentrating, remembering, or making decisions?: No Patient able to express need for assistance with ADLs?: Yes Does the patient have difficulty dressing or bathing?: No Independently performs ADLs?: Yes (appropriate for developmental age) Does the patient have difficulty walking or climbing stairs?: No Weakness of Legs: None Weakness of Arms/Hands: None  Permission Sought/Granted Permission sought to share information with : Facility Art therapist granted to share information with : Yes, Verbal Permission Granted     Permission granted to share info w AGENCY:  Home Health Agency        Emotional Assessment Appearance:: Appears stated age   Affect (typically observed): Accepting, Pleasant Orientation: : Oriented to Self, Oriented to Place, Oriented to  Time, Oriented to Situation Alcohol / Substance Use: Not Applicable Psych Involvement: No (comment)  Admission diagnosis:   LEFT SHOULDER INFECTION Patient Active Problem List   Diagnosis Date Noted  . Prosthetic joint infection ([place]) 02/09/2019  . Degenerative joint disease of left shoulder 02/14/2015  . Pre-syncope 10/06/2013  . Leukocytosis 10/06/2013  . Hypotension 10/06/2013  . Primary localized osteoarthrosis, pelvic region and thigh 07/27/2013  . HTN (hypertension) with goal to be determined 10/28/2011  . DM (diabetes mellitus) ([place]) 10/28/2011  . Hyperlipidemia 10/28/2011  . Reflux esophagitis   . Herpes zoster   . Shingles    PCP:  Kathyrn Lass, MD Pharmacy:   [facility], [place] [address] [place] [address] Phone: [phone] Fax: [phone]     Social Determinants of Health (SDOH) Interventions    Readmission Risk Interventions No flowsheet data found.

## 2019-02-10 NOTE — Progress Notes (Signed)
Pharmacy Antibiotic Note  Tina Frank is a 78 y.o. female admitted on 02/09/2019 with PJI.  Pharmacy has been consulted for vancomycin and ceftriaxone dosing.  Plan: Ceftriaxone 2g IV Q24h Vancomycin 1g IV Q 24 hrs.  Monitor clinical picture, renal function, vanc levels prn F/U C&S, abx deescalation / LOT  Height: 5\' 4"  (162.6 cm) Weight: 132 lb (59.9 kg) IBW/kg (Calculated) : 54.7  Temp (24hrs), Avg:97.6 F (36.4 C), Min:97.3 F (36.3 C), Max:97.9 F (36.6 C)  No results for input(s): WBC, CREATININE, LATICACIDVEN, VANCOTROUGH, VANCOPEAK, VANCORANDOM, GENTTROUGH, GENTPEAK, GENTRANDOM, TOBRATROUGH, TOBRAPEAK, TOBRARND, AMIKACINPEAK, AMIKACINTROU, AMIKACIN in the last 168 hours.  Estimated Creatinine Clearance: 48.4 mL/min (by C-G formula based on SCr of 0.8 mg/dL).    Allergies  Allergen Reactions  . Crestor [Rosuvastatin] Other (See Comments)    Muscle aches  . Norvasc [Amlodipine Besylate] Swelling    Ankle swelling  . Simvastatin Other (See Comments)    Muscle aches  . Sulfa Antibiotics Rash  . Zetia [Ezetimibe] Other (See Comments)    Muscle aches    Thank you for allowing pharmacy to be a part of this patient's care.  Tina Frank 02/10/2019 8:38 AM

## 2019-02-10 NOTE — Evaluation (Signed)
Physical Therapy Evaluation Patient Details Name: Tina Frank MRN: [id number] DOB: [birth year] Today's Date: 02/10/2019   History of Present Illness  78 year old female s/p L total shoulder revision:  explant, neurolysis of axilla nerve; decompression of deep abscess I&D  and hemiarthoplasty with antibiotic spacer    Clinical Impression  Tina PAMPLONA is a 78 y.o. female POD 0 s/p Lt total shoulder revision. Patient reports independence with mobility at baseline and states she has used a RW and SPC previously. Patient is now limited by functional impairments (see PT problem list below) and requires min guard/assist for transfers and gait with SPC. Patient was able to ambulate ~100 feet with RW and min guard assist with cues for sequencing SPC. Patient performed stair mobility with Rt hand rail to ascend/descend as she is NWB on Lt UE, min guard for safety provided. Patient will benefit from continued skilled PT interventions to address impairments and progress towards PLOF. Acute PT will follow to progress mobility with [facility] and stair training in preparation for safe discharge home.     Follow Up Recommendations No PT follow up    Equipment Recommendations  Cane(pt requesting SPC (check if her son has purchased on for her ))    Recommendations for Other Services       Precautions / Restrictions Precautions Precautions: Shoulder Type of Shoulder Precautions: sling on except for bathing, dressing and exercise.  May move elbow wrist and fingers Shoulder Interventions: Don joy ultra sling Restrictions Weight Bearing Restrictions: Yes LUE Weight Bearing: Non weight bearing      Mobility  Bed Mobility Overal bed mobility: Needs Assistance Bed Mobility: Sit to Supine       Sit to supine: Min assist;HOB elevated   General bed mobility comments: min assist to raise LE and control trunk lowering to return to supine  Transfers Overall transfer level: Needs assistance Equipment used:  None Transfers: Sit to/from Stand Sit to Stand: Min guard         General transfer comment: cues for hand placement to initiate power up, no assist required min guard for safety  Ambulation/Gait Ambulation/Gait assistance: Min assist;Min guard Gait Distance (Feet): 100 Feet Assistive device: Straight cane Gait Pattern/deviations: Step-through pattern;Decreased stride length;Drifts right/left Gait velocity: decreased   General Gait Details: cues for sequencing SPC with Lt LE during gait and light min assist to steady throughout, pt with slight sway during gait but able to correct and steady self with cane  Stairs Stairs: Yes Stairs assistance: Min guard Stair Management: One rail Right;Forwards;Step to pattern Number of Stairs: 3 General stair comments: cues for hand placement and to perform step to pattern for safety, min guard for safety, pt slightly unstedy to descend stairs  Wheelchair Mobility    Modified Rankin (Stroke Patients Only)       Balance Overall balance assessment: Needs assistance Sitting-balance support: Feet supported Sitting balance-Leahy Scale: Good     Standing balance support: Single extremity supported;During functional activity Standing balance-Leahy Scale: Good            Pertinent Vitals/Pain Pain Assessment: No/denies pain    Home Living Family/patient expects to be discharged to:: Private residence Living Arrangements: Alone Available Help at Discharge: Family Type of Home: House Home Access: Stairs to enter Entrance Stairs-Rails: Psychiatric nurse of Steps: 3 Home Layout: One level Home Equipment: Bedside commode;Walker - 2 wheels;Cane - single point(over toilet; also has [place] and RW which she doesn't use) Additional Comments: pt's son is  staying x 3 nights then intermittent assist, she is planning to get a personal aid to assist her with ADL's (bathing and dressing). pt reports her cane is not very sturdy and she  would like to get one more stable. Pt has a recliner, lift chair and grab bar under mattress.    Prior Function Level of Independence: Independent               Hand Dominance   Dominant Hand: Right    Extremity/Trunk Assessment   Upper Extremity Assessment Upper Extremity Assessment: Defer to OT evaluation    Lower Extremity Assessment Lower Extremity Assessment: Generalized weakness    Cervical / Trunk Assessment Cervical / Trunk Assessment: Normal  Communication   Communication: No difficulties  Cognition Arousal/Alertness: Awake/alert Behavior During Therapy: WFL for tasks assessed/performed Overall Cognitive Status: Within Functional Limits for tasks assessed         General Comments      Exercises     Assessment/Plan    PT Assessment Patient needs continued PT services  PT Problem List Decreased strength;Decreased mobility;Decreased balance;Decreased knowledge of use of DME       PT Treatment Interventions DME instruction;Functional mobility training;Balance training;Patient/family education;Gait training;Therapeutic activities;Stair training;Therapeutic exercise    PT Goals (Current goals can be found in the Care Plan section)  Acute Rehab PT Goals Patient Stated Goal: return to independence PT Goal Formulation: With patient Time For Goal Achievement: 02/17/19 Potential to Achieve Goals: Good    Frequency Min 3X/week    AM-PAC PT "6 Clicks" Mobility  Outcome Measure Help needed turning from your back to your side while in a flat bed without using bedrails?: A Little Help needed moving from lying on your back to sitting on the side of a flat bed without using bedrails?: A Little Help needed moving to and from a bed to a chair (including a wheelchair)?: A Little Help needed standing up from a chair using your arms (e.g., wheelchair or bedside chair)?: A Little Help needed to walk in hospital room?: A Little Help needed climbing 3-5 steps with a  railing? : A Little 6 Click Score: 18    End of Session Equipment Utilized During Treatment: Gait belt Activity Tolerance: Patient tolerated treatment well Patient left: in bed;with call bell/phone within reach;with bed alarm set Nurse Communication: Mobility status PT Visit Diagnosis: Difficulty in walking, not elsewhere classified (R26.2);Unsteadiness on feet (R26.81)    Time: [id number] PT Time Calculation (min) (ACUTE ONLY): 25 min   Charges:   PT Evaluation $PT Eval Low Complexity: 1 Low PT Treatments $Gait Training: 8-22 mins        Kipp Brood, PT, DPT Physical Therapist with [facility]  02/10/2019 7:23 PM

## 2019-02-10 NOTE — Plan of Care (Signed)
  Problem: Education: Goal: Knowledge of the prescribed therapeutic regimen will improve Outcome: Progressing Goal: Understanding of activity limitations/precautions following surgery will improve Outcome: Progressing Goal: Individualized Educational Video(s) Outcome: Progressing   Problem: Activity: Goal: Ability to tolerate increased activity will improve Outcome: Progressing

## 2019-02-10 NOTE — Progress Notes (Signed)
Occupational Therapy Evaluation Patient Details Name: Tina Frank MRN: [id number] DOB: 29-May-1940 Today's Date: 02/10/2019    History of Present Illness 78 year old female s/p L total shoulder revision:  explant, neurolysis of axilla nerve; decompression of deep abscess i &D  and hemiarthoplasty with antibiotic spacer   Clinical Impression   Pt seen for initial evaluation:  Performed UB adl, hand and wrist exercises, and educated on sling and ice machine.  Pt unsteady walking in hall--recommend PT also.  Pt will have 3 days of 24/7 then intermittent assistance. She will need assistance for UB adls and sling as RUE also has limited ROM. Will follow in acute setting with the goals listed below    Follow Up Recommendations  Supervision/Assistance - 24 hour(HH aide)    Equipment Recommendations  None recommended by OT    Recommendations for Other Services       Precautions / Restrictions Precautions Precautions: Shoulder Type of Shoulder Precautions: sling on except for bathing, dressing and exercise.  May move elbow wrist and fingers Shoulder Interventions: Don joy ultra sling Precaution Booklet Issued: Yes (comment) Restrictions LUE Weight Bearing: Non weight bearing      Mobility Bed Mobility               General bed mobility comments: min A for OOB with HOB raised. Pt has a recliner, lift chair and grab bar under mattress  Transfers Overall transfer level: Needs assistance Equipment used: 1 person hand held assist Transfers: Sit to/from Stand Sit to Stand: Min guard         General transfer comment: for safety    Balance                                           ADL either performed or assessed with clinical judgement   ADL Overall ADL's : Needs assistance/impaired Eating/Feeding: Set up   Grooming: Set up   Upper Body Bathing: Maximal assistance   Lower Body Bathing: Moderate assistance   Upper Body Dressing : Maximal  assistance   Lower Body Dressing: Maximal assistance   Toilet Transfer: Minimal assistance;Ambulation   Toileting- Clothing Manipulation and Hygiene: Minimal assistance         General ADL Comments: pt unsteady walking:  may benefit from cane.  Educated on shoulder protocol, elbow to finger ROM, ice machine, and precautions. Walked in hall; recommend PT work with pt also--spoke to [employer]     Praxis      Pertinent Vitals/Pain Pain Assessment: No/denies pain     Hand Dominance Right   Extremity/Trunk Assessment Upper Extremity Assessment Upper Extremity Assessment: RUE deficits/detail;LUE deficits/detail RUE Deficits / Details: dominant hand; limited shoulder ROM in all directions LUE Deficits / Details: immobilized and block in effect: able to move fingers and wrist (with decreased control)           Communication Communication Communication: No difficulties   Cognition Arousal/Alertness: Awake/alert Behavior During Therapy: WFL for tasks assessed/performed Overall Cognitive Status: Within Functional Limits for tasks assessed                                     General Comments       Exercises  Shoulder Instructions      Home Living Family/patient expects to be discharged to:: Private residence Living Arrangements: Alone Available Help at Discharge: Family               Bathroom Shower/Tub: Teacher, early years/pre: Standard     Home Equipment: Bedside commode(over toilet; also has [place] and RW which she doesn't use)   Additional Comments: sons staying x 3 nights then intermittent assist      Prior Functioning/Environment Level of Independence: Independent                 OT Problem List: Impaired balance (sitting and/or standing);Decreased knowledge of precautions;Impaired UE functional use;Decreased range of motion      OT Treatment/Interventions: Self-care/ADL training;DME  and/or AE instruction;Patient/family education;Balance training;Therapeutic activities    OT Goals(Current goals can be found in the care plan section) Acute Rehab OT Goals Patient Stated Goal: return to independence OT Goal Formulation: With patient Time For Goal Achievement: 02/24/19 Potential to Achieve Goals: Good ADL Goals Additional ADL Goal #1: pt will direct caregiver in assisting with UB adls, sling donning/doffing and ice machine Additional ADL Goal #2: pt will be independent following shoulder precautions and performing elbow, wrist and hand exercises Additional ADL Goal #3: pt will perform toileting at mod I level  OT Frequency: Min 2X/week   Barriers to D/C:            Co-evaluation              AM-PAC OT "6 Clicks" Daily Activity     Outcome Measure Help from another person eating meals?: A Little Help from another person taking care of personal grooming?: A Little Help from another person toileting, which includes using toliet, bedpan, or urinal?: A Little Help from another person bathing (including washing, rinsing, drying)?: A Lot Help from another person to put on and taking off regular upper body clothing?: A Lot Help from another person to put on and taking off regular lower body clothing?: A Lot 6 Click Score: 15   End of Session    Activity Tolerance: Patient tolerated treatment well Patient left: in chair;with call bell/phone within reach;with chair alarm set  OT Visit Diagnosis: Unsteadiness on feet (R26.81)                Time: [id number] OT Time Calculation (min): 55 min Charges:  OT General Charges $OT Visit: 1 Visit OT Evaluation $OT Eval Low Complexity: 1 Low OT Treatments $Self Care/Home Management : 23-37 mins $Therapeutic Activity: 8-22 mins  Lesle Chris, OTR/L Acute Rehabilitation Services [phone] [facility] pager [phone] office 02/10/2019  [place] 02/10/2019, 10:54 AM

## 2019-02-11 DIAGNOSIS — Z95828 Presence of other vascular implants and grafts: Secondary | ICD-10-CM

## 2019-02-11 DIAGNOSIS — Z89232 Acquired absence of left shoulder: Secondary | ICD-10-CM

## 2019-02-11 LAB — BASIC METABOLIC PANEL
Anion gap: 11 (ref 5–15)
BUN: 22 mg/dL (ref 8–23)
CO2: 26 mmol/L (ref 22–32)
Calcium: 8.7 mg/dL — ABNORMAL LOW (ref 8.9–10.3)
Chloride: 101 mmol/L (ref 98–111)
Creatinine, Ser: 0.67 mg/dL (ref 0.44–1.00)
GFR calc Af Amer: >60 mL/min (ref >60)
GFR calc non Af Amer: >60 mL/min (ref >60)
Glucose, Bld: 104 mg/dL — ABNORMAL HIGH (ref 70–99)
Potassium: 3.5 mmol/L (ref 3.5–5.1)
Sodium: 138 mmol/L (ref 135–145)

## 2019-02-11 LAB — CBC
HCT: 29 % — ABNORMAL LOW (ref 36.0–46.0)
Hemoglobin: 8.8 g/dL — ABNORMAL LOW (ref 12.0–15.0)
MCH: 26.5 pg (ref 26.0–34.0)
MCHC: 30.3 g/dL (ref 30.0–36.0)
MCV: 87.3 fL (ref 80.0–100.0)
Platelets: 187 10*3/uL (ref 150–400)
RBC: 3.32 MIL/uL — ABNORMAL LOW (ref 3.87–5.11)
RDW: 14.7 % (ref 11.5–15.5)
WBC: 8.8 10*3/uL (ref 4.0–10.5)
nRBC: 0 % (ref 0.0–0.2)

## 2019-02-11 MED ORDER — HEPARIN SOD (PORK) LOCK FLUSH 100 UNIT/ML IV SOLN
250.0000 [IU] | INTRAVENOUS | Status: AC | PRN
  Administered 2019-02-11: 250 [IU]
  Filled 2019-02-11: qty 2.5

## 2019-02-11 MED ORDER — OXYCODONE HCL 5 MG PO TABS: ORAL_TABLET | ORAL | 0 refills | Status: AC

## 2019-02-11 MED ORDER — CEFTRIAXONE IV (FOR PTA / DISCHARGE USE ONLY): 2.0000 g | INTRAVENOUS | 0 refills | Status: AC

## 2019-02-11 MED ORDER — ACETAMINOPHEN 500 MG PO TABS: 1000.0000 mg | ORAL_TABLET | Freq: Three times a day (TID) | ORAL | 0 refills | Status: AC

## 2019-02-11 MED ORDER — MELOXICAM 7.5 MG PO TABS: 7.5000 mg | ORAL_TABLET | Freq: Every day | ORAL | 2 refills | Status: DC

## 2019-02-11 MED ORDER — ONDANSETRON HCL 4 MG PO TABS: 4.0000 mg | ORAL_TABLET | Freq: Three times a day (TID) | ORAL | 1 refills | Status: AC | PRN

## 2019-02-11 MED ORDER — VANCOMYCIN IV (FOR PTA / DISCHARGE USE ONLY): 1000.0000 mg | INTRAVENOUS | 0 refills | Status: AC

## 2019-02-11 NOTE — Progress Notes (Signed)
Occupational Therapy Treatment Patient Details Name: Tina Frank MRN: [id number] DOB: April 25, 1940 Today's Date: 02/11/2019    History of present illness 78 year old female s/p L total shoulder revision:  explant, neurolysis of axilla nerve; decompression of deep abscess I&D  and hemiarthoplasty with antibiotic spacer   OT comments  Family education completed  Follow Up Recommendations  Supervision/Assistance - 24 hour    Equipment Recommendations  None recommended by OT    Recommendations for Other Services      Precautions / Restrictions Precautions Precautions: Shoulder Type of Shoulder Precautions: sling on except for bathing, dressing and exercise.  May move elbow wrist and fingers Shoulder Interventions: Don joy ultra sling Precaution Booklet Issued: Yes (comment) Required Braces or Orthoses: Sling Restrictions Weight Bearing Restrictions: Yes LUE Weight Bearing: Non weight bearing       Mobility Bed Mobility Overal bed mobility: Modified Independent Bed Mobility: Supine to Sit     Supine to sit: Supervision;HOB elevated;Min guard     General bed mobility comments: oob  Transfers Overall transfer level: Needs assistance Equipment used: None Transfers: Sit to/from Stand Sit to Stand: Min guard         General transfer comment: for safety    Balance                                           ADL either performed or assessed with clinical judgement   ADL                   Upper Body Dressing : Minimal assistance   Lower Body Dressing: Maximal assistance   Toilet Transfer: Min guard;Stand-pivot;BSC   Toileting- Clothing Manipulation and Hygiene: Minimal assistance         General ADL Comments: assisted pt with dressing. She needs min A to don shirt (button down mostly because of picc line catching. She was able to get RUE in with extra effort. Recommended that son tie a button down shirt backwards to give her coverage  or drape a sheet over her for increased privacy.  He practiced donning sling.  Pt did have some difficulty pulling pants and underwerar up completely on L side.       Vision       Perception     Praxis      Cognition Arousal/Alertness: Awake/alert Behavior During Therapy: WFL for tasks assessed/performed Overall Cognitive Status: Within Functional Limits for tasks assessed                                          Exercises     Shoulder Instructions       General Comments      Pertinent Vitals/ Pain       Pain Assessment: No/denies pain  Home Living Family/patient expects to be discharged to:: Private residence Living Arrangements: Alone Available Help at Discharge: Family               Bathroom Shower/Tub: Teacher, early years/pre: Standard     Home Equipment: Bedside commode;Walker - 2 wheels;Cane - single point   Additional Comments: pt's son is staying x 3 nights then intermittent assist, she is planning to get a personal aid to assist her with ADL's (bathing and dressing). pt  reports her cane is not very sturdy and she would like to get one more stable. Pt has a recliner, lift chair and grab bar under mattress..  Pt will sponge bathe      Prior Functioning/Environment Level of Independence: Independent            Frequency  Min 2X/week        Progress Toward Goals  OT Goals(current goals can now be found in the care plan section)  Progress towards OT goals: Progressing toward goals     Plan      Co-evaluation                 AM-PAC OT "6 Clicks" Daily Activity     Outcome Measure   Help from another person eating meals?: A Little Help from another person taking care of personal grooming?: A Little Help from another person toileting, which includes using toliet, bedpan, or urinal?: A Little Help from another person bathing (including washing, rinsing, drying)?: A Lot Help from another person to put on  and taking off regular upper body clothing?: A Little Help from another person to put on and taking off regular lower body clothing?: A Lot 6 Click Score: 16    End of Session    OT Visit Diagnosis: Unsteadiness on feet (R26.81)   Activity Tolerance Patient tolerated treatment well   Patient Left in chair;with call bell/phone within reach;with chair alarm set   Nurse Communication          Time: [id number] OT Time Calculation (min): 31 min  Charges: OT General Charges $OT Visit: 1 Visit OT Treatments $Self Care/Home Management : 23-37 mins  Lesle Chris, OTR/L Acute Rehabilitation Services [phone] [facility] pager [phone] office 02/11/2019   Tina Frank 02/11/2019, 2:59 PM

## 2019-02-11 NOTE — Care Management Important Message (Signed)
Important Message  Patient Details IM Letter given to Kathrin Greathouse SW to present to the Patient Name: Tina Frank MRN: [id number] Date of Birth: 08/30/1940   Medicare Important Message Given:  Yes     Kerin Salen 02/11/2019, 1:10 PM

## 2019-02-11 NOTE — TOC Transition Note (Signed)
Transition of Care [facility]) - CM/SW Discharge Note   Patient Details  Name: Tina Frank MRN: [id number] Date of Birth: 05-Aug-1940  [facility] CM/SW Contact:  Lia Hopping, [place] Phone Number: 02/11/2019, 10:51 AM   Clinical Narrative:   Kasandra Knudsen ordered through [place] and will be delivered to the patient room before discharge.    Final next level of care: [place] Barriers to Discharge: No Barriers Identified   Patient Goals and CMS Choice Patient states their goals for this hospitalization and ongoing recovery are:: For my arm to heal CMS Medicare.gov Compare Post Acute Care list provided to:: Patient Choice offered to / list presented to : Patient  Discharge Placement                       Discharge Plan and Services In-house Referral: Clinical Social Work   Post Acute Care Choice: Home Health          DME Arranged: Kasandra Knudsen DME Agency: [employer] Date DME Agency Contacted: 02/11/19 Time DME Agency Contacted: 40 Representative spoke with at DME Agency: [place]: Roel Cluck, Other - See comment([place]) Date [place]: 02/10/19      Social Determinants of Health ([place]) Interventions     Readmission Risk Interventions No flowsheet data found.

## 2019-02-11 NOTE — Discharge Summary (Signed)
Patient ID: Tina Frank MRN: [id number] DOB/AGE: [birth year] 78 y.o.  Admit date: 02/09/2019 Discharge date: 02/11/2019  Admission Diagnoses: Left shoulder infection  Discharge Diagnoses:  Active Problems:   Prosthetic joint infection [facility])   Past Medical History:  Diagnosis Date  . Arthritis    "probably in my shoulders and left hip" (02/14/2015)  . GERD (gastroesophageal reflux disease)    hx  . Hyperlipidemia   . Hypertension   . Obesity    is resolve- pt. highest weight was 180 10 yrs ago.  Marland Kitchen Reflux esophagitis   . Type II diabetes mellitus ([place])      Procedures Performed: Left shoulder explant of infected total shoulder Neurolysis of axillary nerve Decompression of deep abscess I&D Left hemiarthroplasty with antibiotic spacer  Discharged Condition: good/stable  Hospital Course: Patient was brought in to [facility] on 02/09/2019 for scheduled surgical procedure of explantation of infected total shoulder implants, debridement, and placement of antibiotic spacer. She had PICC line placed on 02/08/2019. She tolerated procedure well. She was kept in the hospital for pain management, medical monitoring, OT consultation, and Infectious Disease consult. She was found to be stable for discharge with appropriate plans in place for continuing care.   Consults:  - Infectious Disease: IV antibiotics: vancomycin plus ceftriaxone. Will follow cultures. Anticipate 6 wk treatment course to include coverage for c.acnes which takes longer to isolate from culture. - Occupational Therapy  Significant Diagnostic Studies: Aerobic/anaerobic cultures - pending, Sed Rate: 53, CRP - 2.3  Treatments: PICC line placement - 12/1 - IV antibiotics Left shoulder explant of infected total shoulder Neurolysis of axillary nerve Decompression of deep abscess I&D Left hemiarthroplasty with antibiotic spacer  Discharge Exam: Left shoulder: Dressing CDI and sling well fitting,  full and  painless ROM throughout hand with DPC of 0. + Motor in  AIN, PIN, Ulnar distributions. Axillary nerve sensation preserved and symmetric.  Sensation intact in medial, radial, and ulnar distributions. Well perfused digits.   Dispo: Home with home health Medications: Tylenol, Mobic, Oxycodone, Zofran - sent electronically to patient's local pharmacy Follow-up: 1 week in office with Dr. Griffin Basil for incision check and x-ray. Restrictions: NWB LUE. Sling at all times except for hygiene.    Disposition: Discharge disposition: 01-Home or Self Care       Discharge Instructions    Call MD for:  redness, tenderness, or signs of infection (pain, swelling, redness, odor or green/yellow discharge around incision site)   Complete by: As directed    Call MD for:  severe uncontrolled pain   Complete by: As directed    Call MD for:  temperature >100.4   Complete by: As directed    Diet - low sodium heart healthy   Complete by: As directed    Discharge instructions   Complete by: As directed    Ophelia Charter MD, MPH Noemi Chapel, PA-C [place]. [address], [address] [phone] (tel)   [phone] (fax)   [place] may leave the operative dressing in place until your follow-up appointment. KEEP THE INCISIONS CLEAN AND DRY.  There may be a small amount of fluid/bleeding leaking at the surgical site. This is normal after surgery.  If it fills with liquid or blood please call us immediately to change it for you.  Use the provided ice machine or Ice packs as often as possible for the first 3-4 days, then as needed for  pain relief.  Keep a layer of cloth or a shirt between your skin and the cooling unit to prevent frost bite as it can get very cold.  SHOWERING: - You may shower on Post-Op Day #2.  - The dressing is water resistant but do not scrub it as it may start to peel up.   - You may remove the  sling for showering, but keep a water resistant pillow under the arm to keep both the  elbow and shoulder away from the body (mimicking the abduction sling).  - Gently pat the area dry.  - Do not soak the shoulder in water. Do not go swimming in the pool or ocean until your sutures are removed. - KEEP THE INCISIONS CLEAN AND DRY.  EXERCISES Wear the sling at all times except when doing your exercises. You may remove the sling for showering, but keep the arm across the chest or in a secondary sling.     Accidental/Purposeful External Rotation and shoulder flexion (reaching behind you) is to be avoided at all costs for the first month. It is ok to come out of your sling if your are sitting and have assistance for eating.  Do not lift anything heavier than 1 pound until we discuss it further in clinic.  Please perform the exercises:   Elbow / Hand / Wrist  Range of Motion Exercises Grip strengthening   REGIONAL ANESTHESIA (NERVE BLOCKS) The anesthesia team may have performed a nerve block for you if safe in the setting of your care.  This is a great tool used to minimize pain.  Typically the block may start wearing off overnight but the long acting medicine may last for 3-4 days.  The nerve block wearing off can be a challenging period but please utilize your as needed pain medications to try and manage this period.    POST-OP MEDICATIONS- Multimodal approach to pain control In general your pain will be controlled with a combination of substances.  Prescriptions unless otherwise discussed are electronically sent to your pharmacy.  This is a carefully made plan we use to minimize narcotic use.    Meloxicam OR Celebrex - Anti-inflammatory medication taken on a scheduled basis Acetaminophen - Non-narcotic pain medicine taken on a scheduled basis  Oxycodone - This is a strong narcotic, to be used only on an "as needed" basis for pain. Zofran - take as needed for nausea  Meloxicam/Celebrex - these  are anti-inflammatory and pain relievers.  Do not take additional ibuprofen, naproxen or other NSAID while taking this medicine.   FOLLOW-UP If you develop a Fever (>101.5), Redness or Drainage from the surgical incision site, please call our office to arrange for an evaluation. Please call the office to schedule a follow-up appointment for a wound check, 7-10 days post-operatively.  IF YOU HAVE ANY QUESTIONS, PLEASE FEEL FREE TO CALL OUR OFFICE.   HELPFUL INFORMATION  If you had a block, it will wear off between 8-24 hrs postop typically.  This is period when your pain may go from nearly zero to the pain you would have had post-op without the block.  This is an abrupt transition but nothing dangerous is happening.  You may take an extra dose of narcotic when this happens.  Your arm will be in a sling following surgery. You will be in this sling for the next 3-4 weeks.  I will let you know the exact duration at your follow-up visit.  You may be more comfortable sleeping in  a semi-seated position the first few nights following surgery.  Keep a pillow propped under the elbow and forearm for comfort.  If you have a recliner type of chair it might be beneficial.  If not that is fine too, but it would be helpful to sleep propped up with pillows behind your operated shoulder as well under your elbow and forearm.  This will reduce pulling on the suture lines.  When dressing, put your operative arm in the sleeve first.  When getting undressed, take your operative arm out last.  Loose fitting, button-down shirts are recommended.  In most states it is against the law to drive while your arm is in a sling. And certainly against the law to drive while taking narcotics.  You may return to work/school in the next couple of days when you feel up to it. Desk work and typing in the sling is fine.  We suggest you use the pain medication the first night prior to going to bed, in order to ease any pain when the  anesthesia wears off. You should avoid taking pain medications on an empty stomach as it will make you nauseous.  Do not drink alcoholic beverages or take illicit drugs when taking pain medications.  Pain medication may make you constipated.  Below are a few solutions to try in this order: Decrease the amount of pain medication if you aren't having pain. Drink lots of decaffeinated fluids. Drink prune juice and/or each dried prunes  If the first 3 don't work start with additional solutions Take Colace - an over-the-counter stool softener Take Senokot - an over-the-counter laxative Take Miralax - a stronger over-the-counter laxative     Allergies as of 02/11/2019      Reactions   Crestor [rosuvastatin] Other (See Comments)   Muscle aches   Norvasc [amlodipine Besylate] Swelling   Ankle swelling   Simvastatin Other (See Comments)   Muscle aches   Sulfa Antibiotics Rash   Zetia [ezetimibe] Other (See Comments)   Muscle aches      Medication List    TAKE these medications   acetaminophen 500 MG tablet Commonly known as: TYLENOL Take 2 tablets (1,000 mg total) by mouth every 8 (eight) hours for 14 days. What changed:   when to take this  reasons to take this   aspirin EC 81 MG tablet Take 81 mg by mouth daily.   atorvastatin 10 MG tablet Commonly known as: LIPITOR Take 10 mg by mouth at bedtime.   calcium carbonate 500 MG chewable tablet Commonly known as: TUMS - dosed in mg elemental calcium Chew 1 tablet by mouth 3 (three) times daily as needed for indigestion or heartburn.   carvedilol 12.5 MG tablet Commonly known as: COREG Take 12.5 mg by mouth 2 (two) times daily with a meal.   cloNIDine 0.1 MG tablet Commonly known as: CATAPRES Take 0.1 mg by mouth daily as needed (if systolic bp is over 0000000).   denosumab 60 MG/ML Sosy injection Commonly known as: PROLIA Inject 60 mg into the skin every 6 (six) months.   diclofenac Sodium 1 % Gel Commonly known as:  VOLTAREN Apply 1 application topically 4 (four) times daily as needed (pain).   Ensure Take 237 mLs by mouth daily.   furosemide 40 MG tablet Commonly known as: LASIX Take 40 mg by mouth 2 (two) times daily.   hydrALAZINE 50 MG tablet Commonly known as: APRESOLINE Take 50 mg by mouth 2 (two) times daily.   meloxicam  7.5 MG tablet Commonly known as: Mobic Take 1 tablet (7.5 mg total) by mouth daily.   ondansetron 4 MG tablet Commonly known as: Zofran Take 1 tablet (4 mg total) by mouth every 8 (eight) hours as needed for up to 7 days for nausea or vomiting.   oxyCODONE 5 MG immediate release tablet Commonly known as: Oxy IR/ROXICODONE Take 1-2 pills every 6 hrs as needed for pain, no more than 6 per day   pantoprazole 40 MG tablet Commonly known as: PROTONIX Take 40 mg by mouth See admin instructions. Take 40 mg once daily, may take a second 40 mg dose as needed for acid reflux   telmisartan 80 MG tablet Commonly known as: MICARDIS Take 0.5 tablets (40 mg total) by mouth daily. What changed: when to take this

## 2019-02-11 NOTE — Progress Notes (Signed)
PHARMACY CONSULT NOTE FOR:  OUTPATIENT  PARENTERAL ANTIBIOTIC THERAPY (OPAT)  Indication: Shoulder PJI Regimen: vancomycin 1gm IV q24h and ceftriaxone 2gm IV q24h End date: 03/24/2019  IV antibiotic discharge orders are pended. To discharging provider:  please sign these orders via discharge navigator,  Select New Orders & click on the button choice - Manage This Unsigned Work.     Thank you for allowing pharmacy to be a part of this patient's care.  Doreene Eland, PharmD, BCPS.   Work Cell: [phone] 02/11/2019 11:42 AM

## 2019-02-11 NOTE — Progress Notes (Signed)
Physical Therapy Treatment Patient Details Name: Tina Frank MRN: [id number] DOB: 06/21/1940 Today's Date: 02/11/2019    History of Present Illness 78 year old female s/p L total shoulder revision:  explant, neurolysis of axilla nerve; decompression of deep abscess I&D  and hemiarthoplasty with antibiotic spacer    PT Comments    POD #2 am session. Pt demonstrated great motivation today to walk and practice the stairs. General bed mobility comments: Pt required CGA for safety while moving out of the bed. General transfer comment: Pt required VC's to push up from teh bed with her R UE, and CGA for steadying and safety once standing General Gait Details: VCs required for sequencing of gait with SPC. Pt required CGA for steadying while ambulating 150 ft. Pt would seem to get off balance but was able to self correct and steady with the [facility] General stair comments: Pt required CGA for safety when ambulating stairs, slightly unsteady. Pt stated that it was easier going up then coming down the stairs. Pt was able to transfer from the recliner to the [facility] and back with CGA for safety. Pt shoulder precautions were reviewed and maintained during treatment.    Follow Up Recommendations  No PT follow up     Equipment Recommendations  Cane    Recommendations for Other Services       Precautions / Restrictions Precautions Precautions: Shoulder Type of Shoulder Precautions: sling on except for bathing, dressing and exercise.  May move elbow wrist and fingers Shoulder Interventions: Don joy ultra sling Required Braces or Orthoses: Sling Restrictions Weight Bearing Restrictions: Yes LUE Weight Bearing: Non weight bearing    Mobility  Bed Mobility Overal bed mobility: Modified Independent Bed Mobility: Supine to Sit     Supine to sit: Supervision;HOB elevated;Min guard     General bed mobility comments: Pt required CGA for safety while moving out of the bed.  Transfers Overall transfer  level: Needs assistance Equipment used: None Transfers: Sit to/from Stand Sit to Stand: Min guard         General transfer comment: Pt required VC's to push up from teh bed with her R UE, and CGA for steadying and safety once standing  Ambulation/Gait Ambulation/Gait assistance: Min guard Gait Distance (Feet): 150 Feet Assistive device: Straight cane Gait Pattern/deviations: Step-through pattern;Decreased stride length;Drifts right/left     General Gait Details: VCs required for sequencing of gait with SPC. Pt required CGA for steadying while ambulating 150 ft. Pt would seem to get off balance but was able to self correct and steady with the [facility]   Stairs Stairs: Yes Stairs assistance: Min guard Stair Management: One rail Right;Forwards;Step to pattern Number of Stairs: 2 General stair comments: Pt required CGA for safety when ambulating stairs, slightly unsteady. Pt stated that it was easier going up then coming down the stairs.   Wheelchair Mobility    Modified Rankin (Stroke Patients Only)       Balance                                            Cognition Arousal/Alertness: Awake/alert Behavior During Therapy: WFL for tasks assessed/performed Overall Cognitive Status: Within Functional Limits for tasks assessed  Exercises      General Comments        Pertinent Vitals/Pain Pain Assessment: No/denies pain    Home Living                      Prior Function            PT Goals (current goals can now be found in the care plan section) Progress towards PT goals: Progressing toward goals    Frequency    Min 3X/week      PT Plan Current plan remains appropriate    Co-evaluation              AM-PAC PT "6 Clicks" Mobility   Outcome Measure  Help needed turning from your back to your side while in a flat bed without using bedrails?: A Little Help needed moving  from lying on your back to sitting on the side of a flat bed without using bedrails?: A Little Help needed moving to and from a bed to a chair (including a wheelchair)?: A Little Help needed standing up from a chair using your arms (e.g., wheelchair or bedside chair)?: A Little Help needed to walk in hospital room?: A Little Help needed climbing 3-5 steps with a railing? : A Little 6 Click Score: 18    End of Session Equipment Utilized During Treatment: Gait belt Activity Tolerance: Patient tolerated treatment well Patient left: in chair;with chair alarm set;with call bell/phone within reach Nurse Communication: Mobility status PT Visit Diagnosis: Difficulty in walking, not elsewhere classified (R26.2);Unsteadiness on feet (R26.81)     Time: [id number] PT Time Calculation (min) (ACUTE ONLY): 26 min  Charges:  $Gait Training: 8-22 mins $Therapeutic Activity: 8-22 mins                     Excell Seltzer, [facility]

## 2019-02-11 NOTE — Anesthesia Postprocedure Evaluation (Signed)
Anesthesia Post Note  Patient: Raul Del  Procedure(s) Performed: TOTAL SHOULDER REVISION (Left Shoulder)     Patient location during evaluation: PACU Anesthesia Type: General Level of consciousness: awake and alert Pain management: pain level controlled Vital Signs Assessment: post-procedure vital signs reviewed and stable Respiratory status: spontaneous breathing, nonlabored ventilation, respiratory function stable and patient connected to nasal cannula oxygen Cardiovascular status: blood pressure returned to baseline and stable Postop Assessment: no apparent nausea or vomiting Anesthetic complications: no    Last Vitals:  Vitals:   02/10/19 2131 02/11/19 0118  BP: (!) 125/55 (!) 109/51  Pulse: 66 63  Resp: 18 17  Temp: 36.6 C 36.7 C  SpO2: 99% 94%    Last Pain:  Vitals:   02/11/19 0118  TempSrc: Oral  PainSc:    Pain Goal: Patients Stated Pain Goal: 2 (02/10/19 2258)                 Tiajuana Amass

## 2019-02-11 NOTE — Progress Notes (Signed)
[facility] for Infectious Disease    Date of Admission:  02/09/2019   Total days of antibiotics 3  ID: Tina Frank is a 78 y.o. female with  Chronic left shoulder pji s/p HW removal and abtx spacer placement Active Problems:   Prosthetic joint infection ([place])    Subjective: Afebrile, looking forward to discharge home. picc line placed and tolerating abtx  Medications:  . acetaminophen  1,000 mg Oral Q8H  . atorvastatin  10 mg Oral QHS  . calcium carbonate  1-2 tablet Oral BID WC  . carvedilol  12.5 mg Oral BID WC  . Chlorhexidine Gluconate Cloth  6 each Topical Daily  . docusate sodium  100 mg Oral BID  . furosemide  40 mg Oral BID  . hydrALAZINE  50 mg Oral BID  . irbesartan  150 mg Oral Daily  . pantoprazole  40 mg Oral Daily  . sodium chloride flush  10-40 mL Intracatheter Q12H    Objective: Vital signs in last 24 hours: Temp:  [97.5 F (36.4 C)-98.9 F (37.2 C)] 98.9 F (37.2 C) (12/04 0739) Pulse Rate:  [63-74] 64 (12/04 0739) Resp:  [16-18] 16 (12/04 0739) BP: (93-135)/(51-83) 120/72 (12/04 0953) SpO2:  [91 %-99 %] 93 % (12/04 0739) Physical Exam  Constitutional:  oriented to person, place, and time. appears well-developed and well-nourished. No distress.  HENT: [place]/AT, PERRLA, no scleral icterus Mouth/Throat: Oropharynx is clear and moist. No oropharyngeal exudate.  Cardiovascular: Normal rate, regular rhythm and normal heart sounds. Exam reveals no gallop and no friction rub.  No murmur heard.  Pulmonary/Chest: Effort normal and breath sounds normal. No respiratory distress.  has no wheezes.  Ext: left arm in sling/wrapped Abdominal: Soft. Bowel sounds are normal.  exhibits no distension. There is no tenderness.  Skin: Skin is warm and dry. No rash noted. No erythema.  Psychiatric: a normal mood and affect.  behavior is normal.    Lab Results Recent Labs    02/11/19 0343  WBC 8.8  HGB 8.8*  HCT 29.0*  NA 138  K 3.5  CL 101  CO2 26  BUN 22   CREATININE 0.67   Liver Panel No results for input(s): PROT, ALBUMIN, AST, ALT, ALKPHOS, BILITOT, BILIDIR, IBILI in the last 72 hours. Sedimentation Rate Recent Labs    02/09/19 2006  ESRSEDRATE 53*   C-Reactive Protein Recent Labs    02/09/19 2006  CRP 2.3*    Microbiology: reviewed Studies/Results: No results found.   Assessment/Plan: Chronic left shoulder PJI = continue with plan of 6 wk of ceftriaxone 2gm iv plus vancomycin renally dosed  Diagnosis: Shoulder pji  Culture Result: pending  Allergies  Allergen Reactions  . Crestor [Rosuvastatin] Other (See Comments)    Muscle aches  . Norvasc [Amlodipine Besylate] Swelling    Ankle swelling  . Simvastatin Other (See Comments)    Muscle aches  . Sulfa Antibiotics Rash  . Zetia [Ezetimibe] Other (See Comments)    Muscle aches    OPAT Orders Discharge antibiotics: Per pharmacy protocol ceftriaxone 2gm iv daily plus vancomycin Aim for Vancomycin trough 15-20 or AUC 400-550 (unless otherwise indicated) Duration: 6 weeks End Date: 03/24/19  [facility] Care Per Protocol:  Labs weekly while on IV antibiotics: _x_ CBC with differential x__ BMP _x_ CRP _x_ ESR _x_ Vancomycin trough   _x_ Please pull PIC at completion of IV antibiotics   Fax weekly labs to [phone]  Clinic Follow Up Appt: In 4-6 wk  @  Dr Baxter Flattery or [facility] for Infectious Diseases Cell: [phone] Pager: [phone]  02/11/2019, 2:08 PM

## 2019-02-14 ENCOUNTER — Other Ambulatory Visit (HOSPITAL_COMMUNITY)
Admission: RE | Admit: 2019-02-14 | Discharge: 2019-02-14 | Disposition: A | Discharge status: Home / Self Care | Payer: Medicare Other | Source: Other Acute Inpatient Hospital | Attending: Physician Assistant | Admitting: Physician Assistant

## 2019-02-14 ENCOUNTER — Telehealth: Payer: Self-pay

## 2019-02-14 DIAGNOSIS — M01X12 Direct infection of left shoulder in infectious and parasitic diseases classified elsewhere: Secondary | ICD-10-CM | POA: Diagnosis present

## 2019-02-14 LAB — BASIC METABOLIC PANEL
Anion gap: 13 (ref 5–15)
BUN: 18 mg/dL (ref 8–23)
CO2: 28 mmol/L (ref 22–32)
Calcium: 8.8 mg/dL — ABNORMAL LOW (ref 8.9–10.3)
Chloride: 97 mmol/L — ABNORMAL LOW (ref 98–111)
Creatinine, Ser: 0.66 mg/dL (ref 0.44–1.00)
GFR calc Af Amer: >60 mL/min (ref >60)
GFR calc non Af Amer: >60 mL/min (ref >60)
Glucose, Bld: 120 mg/dL — ABNORMAL HIGH (ref 70–99)
Potassium: 3.2 mmol/L — ABNORMAL LOW (ref 3.5–5.1)
Sodium: 138 mmol/L (ref 135–145)

## 2019-02-14 LAB — CBC WITH DIFFERENTIAL/PLATELET
Abs Immature Granulocytes: 0.05 10*3/uL (ref 0.00–0.07)
Basophils Absolute: 0 10*3/uL (ref 0.0–0.1)
Basophils Relative: 0 %
Eosinophils Absolute: 0.5 10*3/uL (ref 0.0–0.5)
Eosinophils Relative: 5 %
HCT: 31.8 % — ABNORMAL LOW (ref 36.0–46.0)
Hemoglobin: 9.9 g/dL — ABNORMAL LOW (ref 12.0–15.0)
Immature Granulocytes: 1 %
Lymphocytes Relative: 16 %
Lymphs Abs: 1.5 10*3/uL (ref 0.7–4.0)
MCH: 27.1 pg (ref 26.0–34.0)
MCHC: 31.1 g/dL (ref 30.0–36.0)
MCV: 87.1 fL (ref 80.0–100.0)
Monocytes Absolute: 1 10*3/uL (ref 0.1–1.0)
Monocytes Relative: 11 %
Neutro Abs: 6.2 10*3/uL (ref 1.7–7.7)
Neutrophils Relative %: 67 %
Platelets: 254 10*3/uL (ref 150–400)
RBC: 3.65 MIL/uL — ABNORMAL LOW (ref 3.87–5.11)
RDW: 14.6 % (ref 11.5–15.5)
WBC: 9.2 10*3/uL (ref 4.0–10.5)
nRBC: 0 % (ref 0.0–0.2)

## 2019-02-14 LAB — VANCOMYCIN, TROUGH: Vancomycin Tr: 11 ug/mL — ABNORMAL LOW (ref 15–20)

## 2019-02-14 NOTE — Telephone Encounter (Signed)
Voicemail: patient calling to reschedule appointment with Dr Johnnye Sima tomorrow since surgery was performed last week and she just started IV antibiotics for 6 weeks.  I checked hospital discharge orders and she was told to follow up in 4-6 weeks.  Appointment rescheduled for 03-15-2019 with Dr Johnnye Sima.   Laverle Patter, RN

## 2019-02-15 ENCOUNTER — Ambulatory Visit: Payer: Medicare Other | Admitting: Infectious Diseases

## 2019-02-17 ENCOUNTER — Other Ambulatory Visit (HOSPITAL_COMMUNITY)
Admission: RE | Admit: 2019-02-17 | Discharge: 2019-02-17 | Disposition: A | Discharge status: Home / Self Care | Payer: Medicare Other | Source: Other Acute Inpatient Hospital | Attending: Physician Assistant | Admitting: Physician Assistant

## 2019-02-17 DIAGNOSIS — M01X12 Direct infection of left shoulder in infectious and parasitic diseases classified elsewhere: Secondary | ICD-10-CM | POA: Diagnosis present

## 2019-02-17 LAB — CBC WITH DIFFERENTIAL/PLATELET
Abs Immature Granulocytes: 0.03 10*3/uL (ref 0.00–0.07)
Basophils Absolute: 0.1 10*3/uL (ref 0.0–0.1)
Basophils Relative: 1 %
Eosinophils Absolute: 0.5 10*3/uL (ref 0.0–0.5)
Eosinophils Relative: 7 %
HCT: 30.2 % — ABNORMAL LOW (ref 36.0–46.0)
Hemoglobin: 9.4 g/dL — ABNORMAL LOW (ref 12.0–15.0)
Immature Granulocytes: 0 %
Lymphocytes Relative: 21 %
Lymphs Abs: 1.6 10*3/uL (ref 0.7–4.0)
MCH: 26.8 pg (ref 26.0–34.0)
MCHC: 31.1 g/dL (ref 30.0–36.0)
MCV: 86 fL (ref 80.0–100.0)
Monocytes Absolute: 1 10*3/uL (ref 0.1–1.0)
Monocytes Relative: 13 %
Neutro Abs: 4.2 10*3/uL (ref 1.7–7.7)
Neutrophils Relative %: 58 %
Platelets: 230 10*3/uL (ref 150–400)
RBC: 3.51 MIL/uL — ABNORMAL LOW (ref 3.87–5.11)
RDW: 14.4 % (ref 11.5–15.5)
WBC: 7.3 10*3/uL (ref 4.0–10.5)
nRBC: 0 % (ref 0.0–0.2)

## 2019-02-17 LAB — BASIC METABOLIC PANEL
Anion gap: 11 (ref 5–15)
BUN: 19 mg/dL (ref 8–23)
CO2: 29 mmol/L (ref 22–32)
Calcium: 8.5 mg/dL — ABNORMAL LOW (ref 8.9–10.3)
Chloride: 98 mmol/L (ref 98–111)
Creatinine, Ser: 0.72 mg/dL (ref 0.44–1.00)
GFR calc Af Amer: >60 mL/min (ref >60)
GFR calc non Af Amer: >60 mL/min (ref >60)
Glucose, Bld: 117 mg/dL — ABNORMAL HIGH (ref 70–99)
Potassium: 3.1 mmol/L — ABNORMAL LOW (ref 3.5–5.1)
Sodium: 138 mmol/L (ref 135–145)

## 2019-02-17 LAB — VANCOMYCIN, TROUGH: Vancomycin Tr: 11 ug/mL — ABNORMAL LOW (ref 15–20)

## 2019-02-18 ENCOUNTER — Telehealth: Payer: Self-pay | Admitting: Infectious Diseases

## 2019-02-18 MED ORDER — POTASSIUM CHLORIDE ER 10 MEQ PO TBCR: 20.0000 meq | EXTENDED_RELEASE_TABLET | Freq: Two times a day (BID) | ORAL | 0 refills | Status: DC

## 2019-02-18 NOTE — Telephone Encounter (Signed)
Note and labs faxed as requested.  F: [place], [place]

## 2019-02-18 NOTE — Telephone Encounter (Signed)
Ms. Thibeault lab work now has revealed hypokalemia for the last 2 draws 3.2 and 3.1 respectively. She is taking Lasix 40 mg twice a day without potassium replacement.  I will send in instructions for her to take 20 mEq twice a day for 3 days.  Her hypertension and Lasix are managed by Dr. Moshe Cipro with [facility].  I asked her to please call the office on Monday for further instruction regarding ongoing supplementation need.  Will forward lab results on behalf of the patient.  Reviewed upcoming appointment information with her and she is aware of her appointment with Dr. Johnnye Sima in January 5.  Her PICC line is doing well and is without complaints otherwise.  Pharmacy information verified and prescription sent accordingly.

## 2019-02-21 ENCOUNTER — Other Ambulatory Visit (HOSPITAL_COMMUNITY)
Admission: RE | Admit: 2019-02-21 | Discharge: 2019-02-21 | Disposition: A | Discharge status: Home / Self Care | Payer: Medicare Other | Source: Other Acute Inpatient Hospital | Attending: Physician Assistant | Admitting: Physician Assistant

## 2019-02-21 DIAGNOSIS — X58XXXA Exposure to other specified factors, initial encounter: Secondary | ICD-10-CM | POA: Diagnosis not present

## 2019-02-21 DIAGNOSIS — M01X12 Direct infection of left shoulder in infectious and parasitic diseases classified elsewhere: Secondary | ICD-10-CM | POA: Diagnosis present

## 2019-02-21 DIAGNOSIS — Z96619 Presence of unspecified artificial shoulder joint: Secondary | ICD-10-CM | POA: Diagnosis not present

## 2019-02-21 DIAGNOSIS — T8459XA Infection and inflammatory reaction due to other internal joint prosthesis, initial encounter: Secondary | ICD-10-CM | POA: Insufficient documentation

## 2019-02-21 LAB — CBC WITH DIFFERENTIAL/PLATELET
Abs Immature Granulocytes: 0.04 10*3/uL (ref 0.00–0.07)
Basophils Absolute: 0 10*3/uL (ref 0.0–0.1)
Basophils Relative: 0 %
Eosinophils Absolute: 0.5 10*3/uL (ref 0.0–0.5)
Eosinophils Relative: 7 %
HCT: 27.8 % — ABNORMAL LOW (ref 36.0–46.0)
Hemoglobin: 8.4 g/dL — ABNORMAL LOW (ref 12.0–15.0)
Immature Granulocytes: 1 %
Lymphocytes Relative: 20 %
Lymphs Abs: 1.5 10*3/uL (ref 0.7–4.0)
MCH: 26.3 pg (ref 26.0–34.0)
MCHC: 30.2 g/dL (ref 30.0–36.0)
MCV: 86.9 fL (ref 80.0–100.0)
Monocytes Absolute: 0.8 10*3/uL (ref 0.1–1.0)
Monocytes Relative: 11 %
Neutro Abs: 4.5 10*3/uL (ref 1.7–7.7)
Neutrophils Relative %: 61 %
Platelets: 212 10*3/uL (ref 150–400)
RBC: 3.2 MIL/uL — ABNORMAL LOW (ref 3.87–5.11)
RDW: 14.5 % (ref 11.5–15.5)
WBC: 7.4 10*3/uL (ref 4.0–10.5)
nRBC: 0 % (ref 0.0–0.2)

## 2019-02-21 LAB — BASIC METABOLIC PANEL
Anion gap: 12 (ref 5–15)
BUN: 21 mg/dL (ref 8–23)
CO2: 25 mmol/L (ref 22–32)
Calcium: 8.1 mg/dL — ABNORMAL LOW (ref 8.9–10.3)
Chloride: 101 mmol/L (ref 98–111)
Creatinine, Ser: 0.78 mg/dL (ref 0.44–1.00)
GFR calc Af Amer: >60 mL/min (ref >60)
GFR calc non Af Amer: >60 mL/min (ref >60)
Glucose, Bld: 112 mg/dL — ABNORMAL HIGH (ref 70–99)
Potassium: 3.2 mmol/L — ABNORMAL LOW (ref 3.5–5.1)
Sodium: 138 mmol/L (ref 135–145)

## 2019-02-21 LAB — SEDIMENTATION RATE: Sed Rate: 67 mm/hr — ABNORMAL HIGH (ref 0–22)

## 2019-02-21 LAB — VANCOMYCIN, TROUGH: Vancomycin Tr: 17 ug/mL (ref 15–20)

## 2019-02-21 LAB — C-REACTIVE PROTEIN: CRP: 1.1 mg/dL — ABNORMAL HIGH (ref <1.0)

## 2019-02-22 ENCOUNTER — Ambulatory Visit: Payer: Medicare Other | Admitting: Infectious Diseases

## 2019-02-24 ENCOUNTER — Telehealth: Payer: Self-pay | Admitting: Infectious Diseases

## 2019-02-24 ENCOUNTER — Other Ambulatory Visit (HOSPITAL_COMMUNITY)
Admit: 2019-02-24 | Discharge: 2019-02-24 | Disposition: A | Discharge status: Home / Self Care | Payer: Medicare Other | Source: Ambulatory Visit | Attending: Physician Assistant | Admitting: Physician Assistant

## 2019-02-24 DIAGNOSIS — L03113 Cellulitis of right upper limb: Secondary | ICD-10-CM | POA: Insufficient documentation

## 2019-02-24 LAB — BASIC METABOLIC PANEL
Anion gap: 11 (ref 5–15)
BUN: 19 mg/dL (ref 8–23)
CO2: 26 mmol/L (ref 22–32)
Calcium: 8.5 mg/dL — ABNORMAL LOW (ref 8.9–10.3)
Chloride: 102 mmol/L (ref 98–111)
Creatinine, Ser: 0.8 mg/dL (ref 0.44–1.00)
GFR calc Af Amer: >60 mL/min (ref >60)
GFR calc non Af Amer: >60 mL/min (ref >60)
Glucose, Bld: 115 mg/dL — ABNORMAL HIGH (ref 70–99)
Potassium: 3.8 mmol/L (ref 3.5–5.1)
Sodium: 139 mmol/L (ref 135–145)

## 2019-02-24 LAB — CBC WITH DIFFERENTIAL/PLATELET
Abs Immature Granulocytes: 0.02 10*3/uL (ref 0.00–0.07)
Basophils Absolute: 0 10*3/uL (ref 0.0–0.1)
Basophils Relative: 1 %
Eosinophils Absolute: 0.4 10*3/uL (ref 0.0–0.5)
Eosinophils Relative: 7 %
HCT: 26 % — ABNORMAL LOW (ref 36.0–46.0)
Hemoglobin: 8 g/dL — ABNORMAL LOW (ref 12.0–15.0)
Immature Granulocytes: 0 %
Lymphocytes Relative: 19 %
Lymphs Abs: 1.1 10*3/uL (ref 0.7–4.0)
MCH: 26.7 pg (ref 26.0–34.0)
MCHC: 30.8 g/dL (ref 30.0–36.0)
MCV: 86.7 fL (ref 80.0–100.0)
Monocytes Absolute: 0.8 10*3/uL (ref 0.1–1.0)
Monocytes Relative: 14 %
Neutro Abs: 3.5 10*3/uL (ref 1.7–7.7)
Neutrophils Relative %: 59 %
Platelets: 199 10*3/uL (ref 150–400)
RBC: 3 MIL/uL — ABNORMAL LOW (ref 3.87–5.11)
RDW: 14.7 % (ref 11.5–15.5)
WBC: 5.8 10*3/uL (ref 4.0–10.5)
nRBC: 0 % (ref 0.0–0.2)

## 2019-02-24 LAB — AEROBIC/ANAEROBIC CULTURE W GRAM STAIN (SURGICAL/DEEP WOUND)

## 2019-02-24 LAB — VANCOMYCIN, TROUGH: Vancomycin Tr: 19 ug/mL (ref 15–20)

## 2019-02-24 NOTE — Telephone Encounter (Signed)
Thank you :)

## 2019-02-24 NOTE — Telephone Encounter (Signed)
Per Dr Hatcher, RN relayed verbal orders to Lynn at [facility] pharmacy to stop ceftriaxone and vancomycin and start penicillin 2 million units q 4 hours (continuous option ok) through 03/27/19 (labs per protocol).  This will be delivered and [facility] will send nurse out with anaphylaxis kit for first dose at the home 12/18. ,  M, RN   

## 2019-02-24 NOTE — Telephone Encounter (Signed)
Called by lab.  Pt's Cx has grown P acnes.  She had resection of her shoulder prosthesis earlier this month.  Will change her anbx therapy to Pen 2 million u q4h IV.

## 2019-02-28 ENCOUNTER — Other Ambulatory Visit (HOSPITAL_COMMUNITY)
Admission: RE | Admit: 2019-02-28 | Discharge: 2019-02-28 | Disposition: A | Discharge status: Home / Self Care | Payer: Medicare Other | Source: Other Acute Inpatient Hospital | Attending: Physician Assistant | Admitting: Physician Assistant

## 2019-02-28 DIAGNOSIS — M01X12 Direct infection of left shoulder in infectious and parasitic diseases classified elsewhere: Secondary | ICD-10-CM | POA: Diagnosis present

## 2019-02-28 LAB — BASIC METABOLIC PANEL
Anion gap: 9 (ref 5–15)
BUN: 21 mg/dL (ref 8–23)
CO2: 25 mmol/L (ref 22–32)
Calcium: 8.7 mg/dL — ABNORMAL LOW (ref 8.9–10.3)
Chloride: 106 mmol/L (ref 98–111)
Creatinine, Ser: 0.68 mg/dL (ref 0.44–1.00)
GFR calc Af Amer: >60 mL/min (ref >60)
GFR calc non Af Amer: >60 mL/min (ref >60)
Glucose, Bld: 113 mg/dL — ABNORMAL HIGH (ref 70–99)
Potassium: 4 mmol/L (ref 3.5–5.1)
Sodium: 140 mmol/L (ref 135–145)

## 2019-02-28 LAB — CBC WITH DIFFERENTIAL/PLATELET
Abs Immature Granulocytes: 0.01 10*3/uL (ref 0.00–0.07)
Basophils Absolute: 0 10*3/uL (ref 0.0–0.1)
Basophils Relative: 1 %
Eosinophils Absolute: 0.3 10*3/uL (ref 0.0–0.5)
Eosinophils Relative: 5 %
HCT: 28.4 % — ABNORMAL LOW (ref 36.0–46.0)
Hemoglobin: 8.5 g/dL — ABNORMAL LOW (ref 12.0–15.0)
Immature Granulocytes: 0 %
Lymphocytes Relative: 25 %
Lymphs Abs: 1.4 10*3/uL (ref 0.7–4.0)
MCH: 25.8 pg — ABNORMAL LOW (ref 26.0–34.0)
MCHC: 29.9 g/dL — ABNORMAL LOW (ref 30.0–36.0)
MCV: 86.3 fL (ref 80.0–100.0)
Monocytes Absolute: 0.7 10*3/uL (ref 0.1–1.0)
Monocytes Relative: 12 %
Neutro Abs: 3.2 10*3/uL (ref 1.7–7.7)
Neutrophils Relative %: 57 %
Platelets: 215 10*3/uL (ref 150–400)
RBC: 3.29 MIL/uL — ABNORMAL LOW (ref 3.87–5.11)
RDW: 14.6 % (ref 11.5–15.5)
WBC: 5.7 10*3/uL (ref 4.0–10.5)
nRBC: 0 % (ref 0.0–0.2)

## 2019-03-02 LAB — AEROBIC/ANAEROBIC CULTURE W GRAM STAIN (SURGICAL/DEEP WOUND)
Culture: NO GROWTH
Culture: NO GROWTH
Culture: NO GROWTH

## 2019-03-07 ENCOUNTER — Other Ambulatory Visit (HOSPITAL_COMMUNITY)
Admission: RE | Admit: 2019-03-07 | Discharge: 2019-03-07 | Disposition: A | Discharge status: Home / Self Care | Payer: Medicare Other | Source: Other Acute Inpatient Hospital | Attending: Physician Assistant | Admitting: Physician Assistant

## 2019-03-07 DIAGNOSIS — M01X12 Direct infection of left shoulder in infectious and parasitic diseases classified elsewhere: Secondary | ICD-10-CM | POA: Insufficient documentation

## 2019-03-07 LAB — C-REACTIVE PROTEIN: CRP: 0.6 mg/dL (ref <1.0)

## 2019-03-07 LAB — CBC WITH DIFFERENTIAL/PLATELET
Abs Immature Granulocytes: 0.01 10*3/uL (ref 0.00–0.07)
Basophils Absolute: 0.1 10*3/uL (ref 0.0–0.1)
Basophils Relative: 1 %
Eosinophils Absolute: 0.3 10*3/uL (ref 0.0–0.5)
Eosinophils Relative: 5 %
HCT: 33.9 % — ABNORMAL LOW (ref 36.0–46.0)
Hemoglobin: 10.3 g/dL — ABNORMAL LOW (ref 12.0–15.0)
Immature Granulocytes: 0 %
Lymphocytes Relative: 27 %
Lymphs Abs: 1.6 10*3/uL (ref 0.7–4.0)
MCH: 25.9 pg — ABNORMAL LOW (ref 26.0–34.0)
MCHC: 30.4 g/dL (ref 30.0–36.0)
MCV: 85.4 fL (ref 80.0–100.0)
Monocytes Absolute: 0.7 10*3/uL (ref 0.1–1.0)
Monocytes Relative: 12 %
Neutro Abs: 3.3 10*3/uL (ref 1.7–7.7)
Neutrophils Relative %: 55 %
Platelets: 196 10*3/uL (ref 150–400)
RBC: 3.97 MIL/uL (ref 3.87–5.11)
RDW: 14.3 % (ref 11.5–15.5)
WBC: 6 10*3/uL (ref 4.0–10.5)
nRBC: 0 % (ref 0.0–0.2)

## 2019-03-07 LAB — SEDIMENTATION RATE: Sed Rate: 58 mm/hr — ABNORMAL HIGH (ref 0–22)

## 2019-03-07 LAB — COMPREHENSIVE METABOLIC PANEL
ALT: 12 U/L (ref 0–44)
AST: 15 U/L (ref 15–41)
Albumin: 2.7 g/dL — ABNORMAL LOW (ref 3.5–5.0)
Alkaline Phosphatase: 66 U/L (ref 38–126)
Anion gap: 11 (ref 5–15)
BUN: 29 mg/dL — ABNORMAL HIGH (ref 8–23)
CO2: 24 mmol/L (ref 22–32)
Calcium: 8.8 mg/dL — ABNORMAL LOW (ref 8.9–10.3)
Chloride: 102 mmol/L (ref 98–111)
Creatinine, Ser: 1.05 mg/dL — ABNORMAL HIGH (ref 0.44–1.00)
GFR calc Af Amer: 58 mL/min — ABNORMAL LOW (ref >60)
GFR calc non Af Amer: 50 mL/min — ABNORMAL LOW (ref >60)
Glucose, Bld: 114 mg/dL — ABNORMAL HIGH (ref 70–99)
Potassium: 4.1 mmol/L (ref 3.5–5.1)
Sodium: 137 mmol/L (ref 135–145)
Total Bilirubin: 0.6 mg/dL (ref 0.3–1.2)
Total Protein: 5.9 g/dL — ABNORMAL LOW (ref 6.5–8.1)

## 2019-03-14 ENCOUNTER — Other Ambulatory Visit (HOSPITAL_COMMUNITY)
Admission: RE | Admit: 2019-03-14 | Discharge: 2019-03-14 | Disposition: A | Discharge status: Home / Self Care | Payer: Medicare PPO | Source: Other Acute Inpatient Hospital | Attending: Physician Assistant | Admitting: Physician Assistant

## 2019-03-14 DIAGNOSIS — M01X19 Direct infection of unspecified shoulder in infectious and parasitic diseases classified elsewhere: Secondary | ICD-10-CM | POA: Diagnosis not present

## 2019-03-14 DIAGNOSIS — M01X12 Direct infection of left shoulder in infectious and parasitic diseases classified elsewhere: Secondary | ICD-10-CM | POA: Diagnosis present

## 2019-03-14 LAB — CBC WITH DIFFERENTIAL/PLATELET
Abs Immature Granulocytes: 0.01 10*3/uL (ref 0.00–0.07)
Basophils Absolute: 0 10*3/uL (ref 0.0–0.1)
Basophils Relative: 1 %
Eosinophils Absolute: 0.4 10*3/uL (ref 0.0–0.5)
Eosinophils Relative: 6 %
HCT: 29.6 % — ABNORMAL LOW (ref 36.0–46.0)
Hemoglobin: 8.9 g/dL — ABNORMAL LOW (ref 12.0–15.0)
Immature Granulocytes: 0 %
Lymphocytes Relative: 19 %
Lymphs Abs: 1.3 10*3/uL (ref 0.7–4.0)
MCH: 25.5 pg — ABNORMAL LOW (ref 26.0–34.0)
MCHC: 30.1 g/dL (ref 30.0–36.0)
MCV: 84.8 fL (ref 80.0–100.0)
Monocytes Absolute: 0.9 10*3/uL (ref 0.1–1.0)
Monocytes Relative: 14 %
Neutro Abs: 4 10*3/uL (ref 1.7–7.7)
Neutrophils Relative %: 60 %
Platelets: 151 10*3/uL (ref 150–400)
RBC: 3.49 MIL/uL — ABNORMAL LOW (ref 3.87–5.11)
RDW: 14.1 % (ref 11.5–15.5)
WBC: 6.7 10*3/uL (ref 4.0–10.5)
nRBC: 0 % (ref 0.0–0.2)

## 2019-03-14 LAB — COMPREHENSIVE METABOLIC PANEL
ALT: 10 U/L (ref 0–44)
AST: 13 U/L — ABNORMAL LOW (ref 15–41)
Albumin: 2.6 g/dL — ABNORMAL LOW (ref 3.5–5.0)
Alkaline Phosphatase: 66 U/L (ref 38–126)
Anion gap: 9 (ref 5–15)
BUN: 27 mg/dL — ABNORMAL HIGH (ref 8–23)
CO2: 22 mmol/L (ref 22–32)
Calcium: 8.4 mg/dL — ABNORMAL LOW (ref 8.9–10.3)
Chloride: 105 mmol/L (ref 98–111)
Creatinine, Ser: 1.06 mg/dL — ABNORMAL HIGH (ref 0.44–1.00)
GFR calc Af Amer: 57 mL/min — ABNORMAL LOW (ref >60)
GFR calc non Af Amer: 50 mL/min — ABNORMAL LOW (ref >60)
Glucose, Bld: 106 mg/dL — ABNORMAL HIGH (ref 70–99)
Potassium: 4.4 mmol/L (ref 3.5–5.1)
Sodium: 136 mmol/L (ref 135–145)
Total Bilirubin: 0.3 mg/dL (ref 0.3–1.2)
Total Protein: 5.8 g/dL — ABNORMAL LOW (ref 6.5–8.1)

## 2019-03-15 ENCOUNTER — Other Ambulatory Visit: Payer: Self-pay

## 2019-03-15 ENCOUNTER — Ambulatory Visit: Payer: Medicare PPO | Admitting: Infectious Diseases

## 2019-03-15 ENCOUNTER — Encounter: Payer: Self-pay | Admitting: Infectious Diseases

## 2019-03-15 DIAGNOSIS — T8450XD Infection and inflammatory reaction due to unspecified internal joint prosthesis, subsequent encounter: Secondary | ICD-10-CM | POA: Diagnosis not present

## 2019-03-15 DIAGNOSIS — E876 Hypokalemia: Secondary | ICD-10-CM | POA: Diagnosis not present

## 2019-03-15 DIAGNOSIS — I10 Essential (primary) hypertension: Secondary | ICD-10-CM | POA: Diagnosis not present

## 2019-03-15 NOTE — Progress Notes (Signed)
   Subjective:    Patient ID: Tina Frank, female    DOB: 09-19-1940, 79 y.o.   MRN: [id number]  HPI 79 y.o. female with hx of HTN, GERD, HLD, OA, hx of left shoulder replacement in 2016. She has had pain with since 2019 in setting of elevated inflammatory markers, and CT imaging suggesting loosening of hardware. She had culture negative aspirate cx in Oct 2019. She chose to do chronic oral suppression with cephalexin instead of surgery. MRI this past fall shows 12 x 8.5 x 7.3cm of largely cystic lesion communicating to joint. She underwent repeat aspiration was again NGTD. She underwent resection 02-09-19 and was d/c home with vanco/ceftriaxone. Her Cx ultimately grew P acnes. She was changed to continuous infusion Penicillin on 02-24-19.  She has been doing ok (took stitches out this am, sling off). Would rather have single infusion daily.   12-28 12-2  CRP  0.6 2.3 ESR  58 53  minimal pain, better with sling gone.  No problems with PIC- no erythema or d/c.   Review of Systems  Constitutional: Negative for chills and fever.  Gastrointestinal: Negative for constipation and diarrhea.  Genitourinary: Negative for difficulty urinating.  Please see HPI. All other systems reviewed and negative.      Objective:   Physical Exam Constitutional:      Appearance: Normal appearance.  HENT:     Mouth/Throat:     Mouth: Mucous membranes are moist.     Pharynx: No oropharyngeal exudate.  Eyes:     Extraocular Movements: Extraocular movements intact.     Pupils: Pupils are equal, round, and reactive to light.  Cardiovascular:     Rate and Rhythm: Normal rate and regular rhythm.  Pulmonary:     Effort: Pulmonary effort is normal.     Breath sounds: Normal breath sounds.  Abdominal:     General: Abdomen is flat. Bowel sounds are normal. There is no distension.     Palpations: Abdomen is soft.     Tenderness: There is no abdominal tenderness.  Musculoskeletal:       Arms:     Cervical  back: Normal range of motion.     Right lower leg: No edema.     Left lower leg: No edema.  Skin:    General: Skin is warm and dry.  Neurological:     General: No focal deficit present.     Mental Status: She is alert.  Psychiatric:        Mood and Affect: Mood normal.           Assessment & Plan:

## 2019-03-15 NOTE — Assessment & Plan Note (Signed)
Her last 3 K+ have been normal Advised her she can stop supplement, let her PCP know.

## 2019-03-15 NOTE — Assessment & Plan Note (Signed)
Will f/u with PCP.

## 2019-03-15 NOTE — Assessment & Plan Note (Signed)
She appears to be doing well Her ESR is unchanged.  Will give her 2 months of anbx (no clear guideline in uptodate, duration of prev sx).  Explained her labs to her.  rtc on or about 04-12-19.  Will pull her PIC them .

## 2019-03-16 ENCOUNTER — Other Ambulatory Visit: Payer: Self-pay

## 2019-03-16 ENCOUNTER — Emergency Department (HOSPITAL_COMMUNITY)
Admission: EM | Admit: 2019-03-16 | Discharge: 2019-03-16 | Disposition: A | Discharge status: Home / Self Care | Payer: Medicare PPO | Attending: Emergency Medicine | Admitting: Emergency Medicine

## 2019-03-16 ENCOUNTER — Encounter (HOSPITAL_COMMUNITY): Payer: Self-pay

## 2019-03-16 DIAGNOSIS — Z7982 Long term (current) use of aspirin: Secondary | ICD-10-CM | POA: Insufficient documentation

## 2019-03-16 DIAGNOSIS — I10 Essential (primary) hypertension: Secondary | ICD-10-CM | POA: Insufficient documentation

## 2019-03-16 DIAGNOSIS — Z87891 Personal history of nicotine dependence: Secondary | ICD-10-CM | POA: Insufficient documentation

## 2019-03-16 DIAGNOSIS — Z96642 Presence of left artificial hip joint: Secondary | ICD-10-CM | POA: Diagnosis not present

## 2019-03-16 DIAGNOSIS — Z79899 Other long term (current) drug therapy: Secondary | ICD-10-CM | POA: Diagnosis not present

## 2019-03-16 DIAGNOSIS — E119 Type 2 diabetes mellitus without complications: Secondary | ICD-10-CM | POA: Insufficient documentation

## 2019-03-16 DIAGNOSIS — Z96612 Presence of left artificial shoulder joint: Secondary | ICD-10-CM | POA: Diagnosis not present

## 2019-03-16 DIAGNOSIS — T82838A Hemorrhage of vascular prosthetic devices, implants and grafts, initial encounter: Secondary | ICD-10-CM | POA: Insufficient documentation

## 2019-03-16 DIAGNOSIS — Y712 Prosthetic and other implants, materials and accessory cardiovascular devices associated with adverse incidents: Secondary | ICD-10-CM | POA: Diagnosis not present

## 2019-03-16 NOTE — ED Provider Notes (Signed)
[facility] DEPT Provider Note   CSN: [id number] Arrival date & time: 03/16/19  7948     History Chief Complaint  Patient presents with  . PICC line issues    Tina KUEHNEL is a 79 y.o. female.  HPI   78yF with bleeding from PICC line. Pt noticed bleeding this morning. She noticed the cap had come off and was having some bleeding from it. No other complaints. No pain. Line has otherwise seemed to be functioning fine.   Past Medical History:  Diagnosis Date  . Arthritis    "probably in my shoulders and left hip" (02/14/2015)  . GERD (gastroesophageal reflux disease)    hx  . Hyperlipidemia   . Hypertension   . Obesity    is resolve- pt. highest weight was 180 10 yrs ago.  Marland Kitchen Reflux esophagitis   . Type II diabetes mellitus [facility])     Patient Active Problem List   Diagnosis Date Noted  . Hypokalemia 03/15/2019  . Prosthetic joint infection ([place]) 02/09/2019  . Degenerative joint disease of left shoulder 02/14/2015  . Pre-syncope 10/06/2013  . Leukocytosis 10/06/2013  . Hypotension 10/06/2013  . Primary localized osteoarthrosis, pelvic region and thigh 07/27/2013  . HTN (hypertension) with goal to be determined 10/28/2011  . DM (diabetes mellitus) ([place]) 10/28/2011  . Hyperlipidemia 10/28/2011  . Reflux esophagitis   . Herpes zoster   . Shingles     Past Surgical History:  Procedure Laterality Date  . COLONOSCOPY    . EYE SURGERY     laser surgery for retinal tear.  Marland Kitchen JOINT REPLACEMENT    . PARATHYROIDECTOMY N/A 12/02/2012   Procedure: PARATHYROIDECTOMY with frozen section ;  Surgeon: Earnstine Regal, MD;  Location: [facility];  Service: General;  Laterality: N/A;  . SHOULDER INJECTION Left 07/27/2013   Procedure: SHOULDER INJECTION;  Surgeon: [name], MD;  Location: [place];  Service: Orthopedics;  Laterality: Left;  . STERIOD INJECTION Right 02/14/2015   Procedure: RIGHT SHOULDER CORTISONE INJECTION;  Surgeon: [name], MD;   Location: [place];  Service: Orthopedics;  Laterality: Right;  . TOTAL HIP ARTHROPLASTY Left 07/27/2013   Procedure: TOTAL HIP ARTHROPLASTY ANTERIOR APPROACH;  Surgeon: [name], MD;  Location: [place];  Service: Orthopedics;  Laterality: Left;  . TOTAL SHOULDER ARTHROPLASTY Left 02/14/2015  . TOTAL SHOULDER ARTHROPLASTY Left 02/14/2015   Procedure: LEFT TOTAL SHOULDER ARTHROPLASTY;  Surgeon: [name], MD;  Location: [place];  Service: Orthopedics;  Laterality: Left;  . TOTAL SHOULDER REVISION Left 02/09/2019   Procedure: TOTAL SHOULDER REVISION;  Surgeon: Hiram Gash, MD;  Location: [facility];  Service: Orthopedics;  Laterality: Left;     OB History   No obstetric history on file.     Family History  Problem Relation Age of Onset  . Heart failure Mother   . Cancer Mother   . Cancer Father   . Breast cancer Neg Hx     Social History   Tobacco Use  . Smoking status: Former Smoker    Packs/day: 1.00    Years: 20.00    Pack years: 20.00    Types: Cigarettes    Quit date: 10/28/1987    Years since quitting: 31.4  . Smokeless tobacco: Never Used  . Tobacco comment: quit smoking > 25 years ago.  Substance Use Topics  . Alcohol use: No  . Drug use: No    Home Medications Prior to Admission medications  Medication Sig Start Date End Date Taking? Authorizing Provider  aspirin EC 81 MG tablet Take 81 mg by mouth daily.    [provider]  atorvastatin (LIPITOR) 10 MG tablet Take 10 mg by mouth at bedtime.     [provider]  calcium carbonate (TUMS - DOSED IN MG ELEMENTAL CALCIUM) 500 MG chewable tablet Chew 1 tablet by mouth 3 (three) times daily as needed for indigestion or heartburn.    [provider]  carvedilol (COREG) 12.5 MG tablet Take 12.5 mg by mouth 2 (two) times daily with a meal.  12/04/14   [provider]  cefTRIAXone (ROCEPHIN) IVPB Inject 2 g into the vein daily. Indication:  Shoulder PJI (culture negative) Last Day of  Therapy: 03/24/2019 Labs - Sunday/Monday:  CBC/D, BMP, and vancomycin trough. Labs - Thursday:  BMP and vancomycin trough Labs - Every other week:  ESR and CRP Patient not taking: Reported on 03/15/2019 02/12/19 03/24/19  Ethelda Chick, PA-C  cloNIDine (CATAPRES) 0.1 MG tablet Take 0.1 mg by mouth daily as needed (if systolic bp is over 989).     [provider]  denosumab (PROLIA) 60 MG/ML SOSY injection Inject 60 mg into the skin every 6 (six) months. 03/16/18   Campbell Riches, MD  diclofenac Sodium (VOLTAREN) 1 % GEL Apply 1 application topically 4 (four) times daily as needed (pain).    [provider]  Ensure (ENSURE) Take 237 mLs by mouth daily.    [provider]  furosemide (LASIX) 40 MG tablet Take 40 mg by mouth 2 (two) times daily.  08/15/17   [provider]  hydrALAZINE (APRESOLINE) 50 MG tablet Take 50 mg by mouth 2 (two) times daily. 12/04/14   [provider]  meloxicam (MOBIC) 7.5 MG tablet Take 1 tablet (7.5 mg total) by mouth daily. Patient not taking: Reported on 03/15/2019 02/11/19 02/11/20  Ethelda Chick, PA-C  pantoprazole (PROTONIX) 40 MG tablet Take 40 mg by mouth See admin instructions. Take 40 mg once daily, may take a second 40 mg dose as needed for acid reflux 08/23/18   [provider]  PFIZERPEN 21194174 units injection  03/03/19   [provider]  potassium chloride (KLOR-CON) 10 MEQ tablet Take 2 tablets (20 mEq total) by mouth 2 (two) times daily for 3 days. 02/18/19 02/21/19  Metamora Callas, NP  telmisartan (MICARDIS) 80 MG tablet Take 0.5 tablets (40 mg total) by mouth daily. Patient taking differently: Take 40 mg by mouth 2 (two) times daily.  10/07/13   Kelvin Cellar, MD  vancomycin IVPB Inject 1,000 mg into the vein daily. Indication: shoulder PJI (culture negative) Last Day of Therapy:03/24/2019 Labs - Sunday/Monday:  CBC/D, BMP, and vancomycin trough. Labs - Thursday:  BMP and vancomycin  trough Labs - Every other week:  ESR and CRP Patient not taking: Reported on 03/15/2019 02/12/19 03/24/19  Ethelda Chick, PA-C    Allergies    Crestor [rosuvastatin], Norvasc [amlodipine besylate], Simvastatin, Sulfa antibiotics, and Zetia [ezetimibe]  Review of Systems   Review of Systems All systems reviewed and negative, other than as noted in HPI.  Physical Exam Updated Vital Signs BP (!) 183/118 (BP Location: Right Leg)   Pulse 65   Temp 98 F (36.7 C) (Oral)   Resp 16   Ht '5\' 4"'  (1.626 m)   Wt 61.2 kg   SpO2 100%   BMI 23.17 kg/m   Physical Exam Vitals and nursing note reviewed.  Constitutional:  General: She is not in acute distress.    Appearance: She is well-developed.  HENT:     Head: Normocephalic and atraumatic.  Eyes:     General:        Right eye: No discharge.        Left eye: No discharge.     Conjunctiva/sclera: Conjunctivae normal.  Cardiovascular:     Rate and Rhythm: Normal rate and regular rhythm.     Heart sounds: Normal heart sounds. No murmur. No friction rub. No gallop.      Comments: PICC line RUE. Insertion site looks fine.  Pulmonary:     Effort: Pulmonary effort is normal. No respiratory distress.     Breath sounds: Normal breath sounds.  Abdominal:     General: There is no distension.     Palpations: Abdomen is soft.     Tenderness: There is no abdominal tenderness.  Musculoskeletal:        General: No tenderness.     Cervical back: Neck supple.  Skin:    General: Skin is warm and dry.  Neurological:     Mental Status: She is alert.  Psychiatric:        Behavior: Behavior normal.        Thought Content: Thought content normal.     ED Results / Procedures / Treatments   Labs (all labs ordered are listed, but only abnormal results are displayed) Labs Reviewed - No data to display  EKG None  Radiology No results found.  Procedures Procedures (including critical care time)  Medications Ordered in ED    Medications - No data to display  ED Course  I have reviewed the triage vital signs and the nursing notes.  Pertinent labs & imaging results that were available during my care of the patient were reviewed by me and considered in my medical decision making (see chart for details).    MDM Rules/Calculators/A&P  78yF with bleeding from PICC when cap dislodged. New caps placed. Line working.   Final Clinical Impression(s) / ED Diagnoses Final diagnoses:  Bleeding from peripherally inserted central catheter (PICC), initial encounter [facility])    Rx / [place] Orders ED Discharge Orders    None       Virgel Manifold, MD 03/18/19 1404

## 2019-03-16 NOTE — Discharge Instructions (Addendum)
Continue antibiotics as prescribed. Continue to monitor your PICC line for pain, redness, drainage, bleeding, etc.

## 2019-03-16 NOTE — ED Triage Notes (Signed)
Patient states the cap came off of her left upper arm PICC line. Patient states she had a lot of blood. Patient said the cap fell to the floor and she put the cap back on. Patient is currently receiving Penicillin in the other line. The insertion site is not red or edematous.

## 2019-03-16 NOTE — ED Notes (Addendum)
Cleaned PICC site and line . No bleeding noted at this time. Medication noted to be infusing, no irritation/redness/edema at site.

## 2019-03-21 ENCOUNTER — Telehealth: Payer: Self-pay

## 2019-03-21 ENCOUNTER — Other Ambulatory Visit (HOSPITAL_COMMUNITY)
Admission: RE | Admit: 2019-03-21 | Discharge: 2019-03-21 | Disposition: A | Discharge status: Home / Self Care | Payer: Medicare PPO | Source: Other Acute Inpatient Hospital | Attending: Physician Assistant | Admitting: Physician Assistant

## 2019-03-21 DIAGNOSIS — M01X12 Direct infection of left shoulder in infectious and parasitic diseases classified elsewhere: Secondary | ICD-10-CM | POA: Diagnosis present

## 2019-03-21 DIAGNOSIS — M009 Pyogenic arthritis, unspecified: Secondary | ICD-10-CM | POA: Insufficient documentation

## 2019-03-21 LAB — CBC WITH DIFFERENTIAL/PLATELET
Abs Immature Granulocytes: 0.03 10*3/uL (ref 0.00–0.07)
Basophils Absolute: 0.1 10*3/uL (ref 0.0–0.1)
Basophils Relative: 1 %
Eosinophils Absolute: 0.9 10*3/uL — ABNORMAL HIGH (ref 0.0–0.5)
Eosinophils Relative: 11 %
HCT: 33 % — ABNORMAL LOW (ref 36.0–46.0)
Hemoglobin: 10 g/dL — ABNORMAL LOW (ref 12.0–15.0)
Immature Granulocytes: 0 %
Lymphocytes Relative: 21 %
Lymphs Abs: 1.8 10*3/uL (ref 0.7–4.0)
MCH: 25.6 pg — ABNORMAL LOW (ref 26.0–34.0)
MCHC: 30.3 g/dL (ref 30.0–36.0)
MCV: 84.4 fL (ref 80.0–100.0)
Monocytes Absolute: 0.9 10*3/uL (ref 0.1–1.0)
Monocytes Relative: 11 %
Neutro Abs: 4.7 10*3/uL (ref 1.7–7.7)
Neutrophils Relative %: 56 %
Platelets: 249 10*3/uL (ref 150–400)
RBC: 3.91 MIL/uL (ref 3.87–5.11)
RDW: 14.6 % (ref 11.5–15.5)
WBC: 8.4 10*3/uL (ref 4.0–10.5)
nRBC: 0 % (ref 0.0–0.2)

## 2019-03-21 LAB — BASIC METABOLIC PANEL
Anion gap: 10 (ref 5–15)
BUN: 26 mg/dL — ABNORMAL HIGH (ref 8–23)
CO2: 23 mmol/L (ref 22–32)
Calcium: 9.1 mg/dL (ref 8.9–10.3)
Chloride: 102 mmol/L (ref 98–111)
Creatinine, Ser: 0.92 mg/dL (ref 0.44–1.00)
GFR calc Af Amer: >60 mL/min (ref >60)
GFR calc non Af Amer: 59 mL/min — ABNORMAL LOW (ref >60)
Glucose, Bld: 120 mg/dL — ABNORMAL HIGH (ref 70–99)
Potassium: 4.4 mmol/L (ref 3.5–5.1)
Sodium: 135 mmol/L (ref 135–145)

## 2019-03-21 LAB — C-REACTIVE PROTEIN: CRP: 0.7 mg/dL (ref <1.0)

## 2019-03-21 LAB — SEDIMENTATION RATE: Sed Rate: 44 mm/hr — ABNORMAL HIGH (ref 0–22)

## 2019-03-21 NOTE — Telephone Encounter (Signed)
[facility] calling regarding stop date of IV antibiotics.  They have stop date as Jan 17 and patient states it was extended tthrough Feburary 2nd.    Per last office note with Dr Johnnye Sima on 03-15-2019 , IV antibiotics extended to 04-12-2019.   Pharmacy informed.   Laverle Patter, RN

## 2019-03-23 ENCOUNTER — Ambulatory Visit: Payer: Medicare Other | Admitting: Podiatry

## 2019-03-28 ENCOUNTER — Other Ambulatory Visit (HOSPITAL_COMMUNITY)
Admission: RE | Admit: 2019-03-28 | Discharge: 2019-03-28 | Disposition: A | Discharge status: Home / Self Care | Payer: Medicare PPO | Source: Other Acute Inpatient Hospital | Attending: Physician Assistant | Admitting: Physician Assistant

## 2019-03-28 DIAGNOSIS — M01X12 Direct infection of left shoulder in infectious and parasitic diseases classified elsewhere: Secondary | ICD-10-CM | POA: Insufficient documentation

## 2019-03-28 LAB — COMPREHENSIVE METABOLIC PANEL
ALT: 10 U/L (ref 0–44)
AST: 13 U/L — ABNORMAL LOW (ref 15–41)
Albumin: 2.3 g/dL — ABNORMAL LOW (ref 3.5–5.0)
Alkaline Phosphatase: 65 U/L (ref 38–126)
Anion gap: 9 (ref 5–15)
BUN: 25 mg/dL — ABNORMAL HIGH (ref 8–23)
CO2: 23 mmol/L (ref 22–32)
Calcium: 8.5 mg/dL — ABNORMAL LOW (ref 8.9–10.3)
Chloride: 105 mmol/L (ref 98–111)
Creatinine, Ser: 0.9 mg/dL (ref 0.44–1.00)
GFR calc Af Amer: >60 mL/min (ref >60)
GFR calc non Af Amer: >60 mL/min (ref >60)
Glucose, Bld: 97 mg/dL (ref 70–99)
Potassium: 4 mmol/L (ref 3.5–5.1)
Sodium: 137 mmol/L (ref 135–145)
Total Bilirubin: 0.4 mg/dL (ref 0.3–1.2)
Total Protein: 5.7 g/dL — ABNORMAL LOW (ref 6.5–8.1)

## 2019-03-28 LAB — CBC WITH DIFFERENTIAL/PLATELET
Abs Immature Granulocytes: 0.02 10*3/uL (ref 0.00–0.07)
Basophils Absolute: 0.1 10*3/uL (ref 0.0–0.1)
Basophils Relative: 1 %
Eosinophils Absolute: 0.6 10*3/uL — ABNORMAL HIGH (ref 0.0–0.5)
Eosinophils Relative: 10 %
HCT: 29 % — ABNORMAL LOW (ref 36.0–46.0)
Hemoglobin: 8.9 g/dL — ABNORMAL LOW (ref 12.0–15.0)
Immature Granulocytes: 0 %
Lymphocytes Relative: 23 %
Lymphs Abs: 1.5 10*3/uL (ref 0.7–4.0)
MCH: 25.9 pg — ABNORMAL LOW (ref 26.0–34.0)
MCHC: 30.7 g/dL (ref 30.0–36.0)
MCV: 84.5 fL (ref 80.0–100.0)
Monocytes Absolute: 0.9 10*3/uL (ref 0.1–1.0)
Monocytes Relative: 14 %
Neutro Abs: 3.3 10*3/uL (ref 1.7–7.7)
Neutrophils Relative %: 52 %
Platelets: 182 10*3/uL (ref 150–400)
RBC: 3.43 MIL/uL — ABNORMAL LOW (ref 3.87–5.11)
RDW: 14.6 % (ref 11.5–15.5)
WBC: 6.3 10*3/uL (ref 4.0–10.5)
nRBC: 0 % (ref 0.0–0.2)

## 2019-04-04 ENCOUNTER — Other Ambulatory Visit (HOSPITAL_COMMUNITY)
Admission: RE | Admit: 2019-04-04 | Discharge: 2019-04-04 | Disposition: A | Discharge status: Home / Self Care | Payer: Medicare PPO | Source: Other Acute Inpatient Hospital | Attending: Physician Assistant | Admitting: Physician Assistant

## 2019-04-04 DIAGNOSIS — M01X12 Direct infection of left shoulder in infectious and parasitic diseases classified elsewhere: Secondary | ICD-10-CM | POA: Insufficient documentation

## 2019-04-04 DIAGNOSIS — M009 Pyogenic arthritis, unspecified: Secondary | ICD-10-CM | POA: Diagnosis present

## 2019-04-04 LAB — CBC WITH DIFFERENTIAL/PLATELET
Abs Immature Granulocytes: 0.01 10*3/uL (ref 0.00–0.07)
Basophils Absolute: 0 10*3/uL (ref 0.0–0.1)
Basophils Relative: 1 %
Eosinophils Absolute: 0.6 10*3/uL — ABNORMAL HIGH (ref 0.0–0.5)
Eosinophils Relative: 10 %
HCT: 31.2 % — ABNORMAL LOW (ref 36.0–46.0)
Hemoglobin: 9.6 g/dL — ABNORMAL LOW (ref 12.0–15.0)
Immature Granulocytes: 0 %
Lymphocytes Relative: 25 %
Lymphs Abs: 1.7 10*3/uL (ref 0.7–4.0)
MCH: 25.3 pg — ABNORMAL LOW (ref 26.0–34.0)
MCHC: 30.8 g/dL (ref 30.0–36.0)
MCV: 82.3 fL (ref 80.0–100.0)
Monocytes Absolute: 0.9 10*3/uL (ref 0.1–1.0)
Monocytes Relative: 13 %
Neutro Abs: 3.3 10*3/uL (ref 1.7–7.7)
Neutrophils Relative %: 51 %
Platelets: 195 10*3/uL (ref 150–400)
RBC: 3.79 MIL/uL — ABNORMAL LOW (ref 3.87–5.11)
RDW: 14.5 % (ref 11.5–15.5)
WBC: 6.5 10*3/uL (ref 4.0–10.5)
nRBC: 0 % (ref 0.0–0.2)

## 2019-04-04 LAB — COMPREHENSIVE METABOLIC PANEL
ALT: 12 U/L (ref 0–44)
AST: 15 U/L (ref 15–41)
Albumin: 2.6 g/dL — ABNORMAL LOW (ref 3.5–5.0)
Alkaline Phosphatase: 72 U/L (ref 38–126)
Anion gap: 10 (ref 5–15)
BUN: 34 mg/dL — ABNORMAL HIGH (ref 8–23)
CO2: 25 mmol/L (ref 22–32)
Calcium: 9.6 mg/dL (ref 8.9–10.3)
Chloride: 100 mmol/L (ref 98–111)
Creatinine, Ser: 0.99 mg/dL (ref 0.44–1.00)
GFR calc Af Amer: >60 mL/min (ref >60)
GFR calc non Af Amer: 54 mL/min — ABNORMAL LOW (ref >60)
Glucose, Bld: 104 mg/dL — ABNORMAL HIGH (ref 70–99)
Potassium: 3.9 mmol/L (ref 3.5–5.1)
Sodium: 135 mmol/L (ref 135–145)
Total Bilirubin: 0.5 mg/dL (ref 0.3–1.2)
Total Protein: 6.1 g/dL — ABNORMAL LOW (ref 6.5–8.1)

## 2019-04-04 LAB — SEDIMENTATION RATE: Sed Rate: 48 mm/hr — ABNORMAL HIGH (ref 0–22)

## 2019-04-04 LAB — C-REACTIVE PROTEIN: CRP: 0.7 mg/dL (ref <1.0)

## 2019-04-11 ENCOUNTER — Telehealth: Payer: Self-pay

## 2019-04-11 ENCOUNTER — Other Ambulatory Visit (HOSPITAL_COMMUNITY)
Admit: 2019-04-11 | Discharge: 2019-04-11 | Disposition: A | Discharge status: Home / Self Care | Payer: Medicare PPO | Source: Ambulatory Visit | Attending: Physician Assistant | Admitting: Physician Assistant

## 2019-04-11 DIAGNOSIS — M01X12 Direct infection of left shoulder in infectious and parasitic diseases classified elsewhere: Secondary | ICD-10-CM | POA: Diagnosis present

## 2019-04-11 LAB — CBC WITH DIFFERENTIAL/PLATELET
Abs Immature Granulocytes: 0.02 10*3/uL (ref 0.00–0.07)
Basophils Absolute: 0 10*3/uL (ref 0.0–0.1)
Basophils Relative: 1 %
Eosinophils Absolute: 0.6 10*3/uL — ABNORMAL HIGH (ref 0.0–0.5)
Eosinophils Relative: 9 %
HCT: 30.5 % — ABNORMAL LOW (ref 36.0–46.0)
Hemoglobin: 9.3 g/dL — ABNORMAL LOW (ref 12.0–15.0)
Immature Granulocytes: 0 %
Lymphocytes Relative: 22 %
Lymphs Abs: 1.4 10*3/uL (ref 0.7–4.0)
MCH: 25.3 pg — ABNORMAL LOW (ref 26.0–34.0)
MCHC: 30.5 g/dL (ref 30.0–36.0)
MCV: 82.9 fL (ref 80.0–100.0)
Monocytes Absolute: 0.9 10*3/uL (ref 0.1–1.0)
Monocytes Relative: 13 %
Neutro Abs: 3.6 10*3/uL (ref 1.7–7.7)
Neutrophils Relative %: 55 %
Platelets: 183 10*3/uL (ref 150–400)
RBC: 3.68 MIL/uL — ABNORMAL LOW (ref 3.87–5.11)
RDW: 14.4 % (ref 11.5–15.5)
WBC: 6.5 10*3/uL (ref 4.0–10.5)
nRBC: 0 % (ref 0.0–0.2)

## 2019-04-11 LAB — COMPREHENSIVE METABOLIC PANEL
ALT: 12 U/L (ref 0–44)
AST: 13 U/L — ABNORMAL LOW (ref 15–41)
Albumin: 2.4 g/dL — ABNORMAL LOW (ref 3.5–5.0)
Alkaline Phosphatase: 64 U/L (ref 38–126)
Anion gap: 10 (ref 5–15)
BUN: 32 mg/dL — ABNORMAL HIGH (ref 8–23)
CO2: 23 mmol/L (ref 22–32)
Calcium: 8.6 mg/dL — ABNORMAL LOW (ref 8.9–10.3)
Chloride: 103 mmol/L (ref 98–111)
Creatinine, Ser: 1.02 mg/dL — ABNORMAL HIGH (ref 0.44–1.00)
GFR calc Af Amer: >60 mL/min (ref >60)
GFR calc non Af Amer: 52 mL/min — ABNORMAL LOW (ref >60)
Glucose, Bld: 102 mg/dL — ABNORMAL HIGH (ref 70–99)
Potassium: 3.8 mmol/L (ref 3.5–5.1)
Sodium: 136 mmol/L (ref 135–145)
Total Bilirubin: 0.7 mg/dL (ref 0.3–1.2)
Total Protein: 6 g/dL — ABNORMAL LOW (ref 6.5–8.1)

## 2019-04-11 NOTE — Telephone Encounter (Signed)
COVID-19 Pre-Screening Questions:04/11/19  Do you currently have a fever (>100 F), chills or unexplained body aches? NO   Are you currently experiencing new cough, shortness of breath, sore throat, runny nose? NO .  Have you recently travelled outside the state of [place] in the last 14 days? NO .  Have you been in contact with someone that is currently pending confirmation of Covid19 testing or has been confirmed to have the [place] virus?  No  **If the patient answers NO to ALL questions -  advise the patient to please call the clinic before coming to the office should any symptoms develop.

## 2019-04-12 ENCOUNTER — Other Ambulatory Visit: Payer: Self-pay

## 2019-04-12 ENCOUNTER — Encounter: Payer: Self-pay | Admitting: Infectious Diseases

## 2019-04-12 ENCOUNTER — Telehealth: Payer: Self-pay

## 2019-04-12 ENCOUNTER — Ambulatory Visit: Payer: Medicare PPO | Admitting: Infectious Diseases

## 2019-04-12 ENCOUNTER — Telehealth: Payer: Self-pay | Admitting: *Deleted

## 2019-04-12 DIAGNOSIS — T8450XD Infection and inflammatory reaction due to unspecified internal joint prosthesis, subsequent encounter: Secondary | ICD-10-CM | POA: Diagnosis not present

## 2019-04-12 DIAGNOSIS — L8992 Pressure ulcer of unspecified site, stage 2: Secondary | ICD-10-CM | POA: Insufficient documentation

## 2019-04-12 DIAGNOSIS — L89152 Pressure ulcer of sacral region, stage 2: Secondary | ICD-10-CM | POA: Diagnosis not present

## 2019-04-12 DIAGNOSIS — L8990 Pressure ulcer of unspecified site, unspecified stage: Secondary | ICD-10-CM | POA: Insufficient documentation

## 2019-04-12 MED ORDER — CEPHALEXIN 750 MG PO CAPS: 750.0000 mg | ORAL_CAPSULE | Freq: Two times a day (BID) | ORAL | 3 refills | Status: DC

## 2019-04-12 NOTE — Assessment & Plan Note (Signed)
Have asked her to apply A & D or desitin to wounds.  Have asked her to use doughnut to sit on to relieve pressure If not better, will send her to [facility] clinic.

## 2019-04-12 NOTE — Progress Notes (Addendum)
   Subjective:    Patient ID: Tina Frank, female    DOB: 09/06/1940, 79 y.o.   MRN: [id number]  HPI 79 y.o.femalewith hx of HTN, GERD, HLD, OA, hx of left shoulder replacement in 2016. She has had pain with since 2019 in setting of elevated inflammatory markers, and CT imaging suggesting loosening of hardware. She had culture negative aspirate cx in Oct 2019. She chose to do chronic oral suppression with cephalexin instead of surgery. MRI this past fall shows 12 x 8.5 x 7.3cm of largely cystic lesion communicating to joint. She underwent repeat aspiration was again NGTD. She underwent resection 02-09-19 and was d/c home with vanco/ceftriaxone. Her Cx ultimately grew P acnes. She was changed to continuous infusion Penicillin on 02-24-19.  She has been doing ok (took stitches out this am, sling off). Would rather have single infusion daily.            04-04-19   12-28   12-2      CRP  0.7  0.6         2.3 ESR   48  58           53  She would like her PIC out today.  She was prev on po Keflex prior to her most recent IV course.  No f/c. Minimal pain.   Review of Systems  Constitutional: Negative for appetite change, chills, fever and unexpected weight change.  Gastrointestinal: Negative for constipation and diarrhea.  Genitourinary: Negative for dysuria.  Skin: Positive for wound.       Objective:   Physical Exam Vitals reviewed.  Constitutional:      General: She is not in acute distress.    Appearance: Normal appearance. She is not toxic-appearing.  HENT:     Mouth/Throat:     Mouth: Mucous membranes are moist.     Pharynx: No oropharyngeal exudate.  Eyes:     Extraocular Movements: Extraocular movements intact.     Pupils: Pupils are equal, round, and reactive to light.  Cardiovascular:     Rate and Rhythm: Normal rate and regular rhythm.  Pulmonary:     Effort: Pulmonary effort is normal.     Breath sounds: Normal breath sounds.  Abdominal:     General: Bowel sounds are  normal. There is no distension.     Palpations: Abdomen is soft.     Tenderness: There is no abdominal tenderness.  Musculoskeletal:       Arms:     Cervical back: Normal range of motion and neck supple.  Skin:      Neurological:     General: No focal deficit present.  Psychiatric:        Mood and Affect: Mood normal.           Assessment & Plan:   

## 2019-04-12 NOTE — Telephone Encounter (Signed)
Per verbal order from Dr Hatcher, RN relayed order to Melissa at [facility] to have nursing draw ESR, CRP and then pull PICC.  Patient requested IV pump be disconnected while in office, ok per Dr Hatcher.  Done. ,  M, RN  

## 2019-04-12 NOTE — Telephone Encounter (Signed)
[facility] called office today for orders to pull picc. Last dose of IV penicillin was today. Patient is scheduled with MD today will call with new orders pending on appt. Tina Frank

## 2019-04-12 NOTE — Assessment & Plan Note (Signed)
She appears to be doing well and wants PIC out.  uptodate rec 1st gen cephalosporin as f/u.  Will start her (back) on keflex Plan for 3 months Will see her back then  F/u CRP and ESR

## 2019-04-27 ENCOUNTER — Ambulatory Visit: Payer: Medicare PPO | Admitting: Podiatry

## 2019-04-27 ENCOUNTER — Encounter: Payer: Self-pay | Admitting: Podiatry

## 2019-04-27 ENCOUNTER — Other Ambulatory Visit: Payer: Self-pay

## 2019-04-27 DIAGNOSIS — E119 Type 2 diabetes mellitus without complications: Secondary | ICD-10-CM | POA: Diagnosis not present

## 2019-04-27 DIAGNOSIS — B351 Tinea unguium: Secondary | ICD-10-CM | POA: Diagnosis not present

## 2019-04-27 DIAGNOSIS — M79676 Pain in unspecified toe(s): Secondary | ICD-10-CM

## 2019-04-27 NOTE — Progress Notes (Signed)
Patient ID: MIGUEL MARCOS, female   DOB: 01/01/1941, 79 y.o.   MRN: [id number] Complaint:  Visit Type: Patient returns to my office for continued preventative foot care services. Complaint: Patient states" my nails have grown long and thick and become painful to walk and wear shoes" Patient has been diagnosed with DM with no foot complications. The patient presents for preventative foot care services. No changes to ROS  Podiatric Exam: Vascular: dorsalis pedis and posterior tibial pulses are palpable bilateral. Capillary return is immediate. Temperature gradient is WNL. Skin turgor WNL  Sensorium: Normal Semmes Weinstein monofilament test. Normal tactile sensation bilaterally. Nail Exam: Pt has thick disfigured discolored nails with subungual debris noted bilateral entire nail hallux through fifth toenails Ulcer Exam: There is no evidence of ulcer or pre-ulcerative changes or infection. Orthopedic Exam: Muscle tone and strength are WNL. No limitations in general ROM. No crepitus or effusions noted. Foot type and digits show no abnormalities. Bony prominences are unremarkable. Skin: No Porokeratosis. No infection or ulcers  Diagnosis:  Onychomycosis, , Pain in right toe, pain in left toes  Treatment & Plan Procedures and Treatment: Consent by patient was obtained for treatment procedures. The patient understood the discussion of treatment and procedures well. All questions were answered thoroughly reviewed. Debridement of mycotic and hypertrophic toenails, 1 through 5 bilateral and clearing of subungual debris. No ulceration, no infection noted.  Return Visit-Office Procedure: Patient instructed to return to the office for a follow up visit 9 weeks  for continued evaluation and treatment.    Gardiner Barefoot DPM

## 2019-05-16 ENCOUNTER — Other Ambulatory Visit: Payer: Self-pay | Admitting: Infectious Diseases

## 2019-05-16 DIAGNOSIS — M19212 Secondary osteoarthritis, left shoulder: Secondary | ICD-10-CM

## 2019-05-16 NOTE — Telephone Encounter (Signed)
Patient called stating that pharmacy notified her that her insurance would not cover 750mg  antibiotic dosage. Patient requested that medication dosage be changed back to 500mg  in order to have insurance coverage. Informed patient that RN would forward request to provider and follow up with pharmacy to find out more information.   Tina Frank Lorita Officer, RN

## 2019-05-17 ENCOUNTER — Other Ambulatory Visit: Payer: Self-pay

## 2019-05-17 DIAGNOSIS — T8450XD Infection and inflammatory reaction due to unspecified internal joint prosthesis, subsequent encounter: Secondary | ICD-10-CM

## 2019-05-17 MED ORDER — CEPHALEXIN 750 MG PO CAPS: 750.0000 mg | ORAL_CAPSULE | Freq: Two times a day (BID) | ORAL | 2 refills | Status: DC

## 2019-05-17 MED FILL — CEPHALEXIN 750 MG CAPS: 750 | 90 days supply | Qty: 180 | Fill #0

## 2019-05-17 NOTE — Telephone Encounter (Signed)
Prior authorization started for 750mg  dose. Patient notified. Will await results.   [place] Lorita Officer, RN

## 2019-05-18 NOTE — Telephone Encounter (Signed)
Patient will pick up 90-day supply today at [facility]. $24.00 copay. Medication is currently on allocation for that strength and patient is aware.

## 2019-05-19 MED FILL — AMOXICILLIN 500 MG CAPSULE: 500 | 1 days supply | Qty: 4 | Fill #0

## 2019-07-01 ENCOUNTER — Encounter: Payer: Self-pay | Admitting: Podiatry

## 2019-07-01 ENCOUNTER — Other Ambulatory Visit: Payer: Self-pay

## 2019-07-01 ENCOUNTER — Ambulatory Visit: Payer: Medicare PPO | Admitting: Podiatry

## 2019-07-01 DIAGNOSIS — E119 Type 2 diabetes mellitus without complications: Secondary | ICD-10-CM | POA: Diagnosis not present

## 2019-07-01 DIAGNOSIS — B351 Tinea unguium: Secondary | ICD-10-CM | POA: Diagnosis not present

## 2019-07-01 DIAGNOSIS — M79676 Pain in unspecified toe(s): Secondary | ICD-10-CM | POA: Diagnosis not present

## 2019-07-01 NOTE — Progress Notes (Signed)
This patient returns to my office for at risk foot care.  This patient requires this care by a professional since this patient will be at risk due to having diabetes type 2.  This patient is unable to cut nails herself since the patient cannot reach her nails.These nails are painful walking and wearing shoes.  This patient presents for at risk foot care today.  General Appearance  Alert, conversant and in no acute stress.  Vascular  Dorsalis pedis and posterior tibial  pulses are palpable  bilaterally.  Capillary return is within normal limits  bilaterally. Temperature is within normal limits  bilaterally.  Neurologic  Senn-Weinstein monofilament wire test within normal limits  bilaterally. Muscle power within normal limits bilaterally.  Nails Thick disfigured discolored nails with subungual debris  from hallux to fifth toes bilaterally. No evidence of bacterial infection or drainage bilaterally.  Orthopedic  No limitations of motion  feet .  No crepitus or effusions noted.  No bony pathology or digital deformities noted.  Skin  normotropic skin with no porokeratosis noted bilaterally.  No signs of infections or ulcers noted.     Onychomycosis  Pain in right toes  Pain in left toes  Consent was obtained for treatment procedures.   Mechanical debridement of nails 1-5  bilaterally performed with a nail nipper.  Filed with dremel without incident.    Return office visit      9 weeks               Told patient to return for periodic foot care and evaluation due to potential at risk complications.   Jaymarion Trombly DPM  

## 2019-07-15 ENCOUNTER — Ambulatory Visit: Payer: Medicare PPO | Admitting: Infectious Diseases

## 2019-07-19 ENCOUNTER — Other Ambulatory Visit: Payer: Self-pay

## 2019-07-19 ENCOUNTER — Ambulatory Visit: Payer: Medicare PPO | Admitting: Infectious Diseases

## 2019-07-19 DIAGNOSIS — M19019 Primary osteoarthritis, unspecified shoulder: Secondary | ICD-10-CM

## 2019-07-19 DIAGNOSIS — E785 Hyperlipidemia, unspecified: Secondary | ICD-10-CM | POA: Diagnosis not present

## 2019-07-19 DIAGNOSIS — E1122 Type 2 diabetes mellitus with diabetic chronic kidney disease: Secondary | ICD-10-CM

## 2019-07-19 DIAGNOSIS — N182 Chronic kidney disease, stage 2 (mild): Secondary | ICD-10-CM

## 2019-07-19 DIAGNOSIS — T8450XD Infection and inflammatory reaction due to unspecified internal joint prosthesis, subsequent encounter: Secondary | ICD-10-CM

## 2019-07-19 NOTE — Assessment & Plan Note (Signed)
Has PCP f/u tomorrow.  Is off metformin.  Last A1C 6.1.

## 2019-07-19 NOTE — Assessment & Plan Note (Signed)
No results found for: CHOL, HDL, LDLCALC, LDLDIRECT, TRIG, CHOLHDL  She will f/u with pcp soon.

## 2019-07-19 NOTE — Assessment & Plan Note (Signed)
Has completed 6 months of rx Has f/u with ortho 08-09-19 Will check her ESR and CRP today- if unchanged or better, will stop keflex (esp given her hardware is out).  Will call her with result.

## 2019-07-19 NOTE — Progress Notes (Signed)
   Subjective:    Patient ID: Tina Frank, female    DOB: 05/14/1940, 79 y.o.   MRN: [id number]  HPI 79 y.o.femalewith hx of HTN, GERD, HLD, OA, hx of left shoulder replacement in 2016. She has had pain with since 2019 in setting of elevated inflammatory markers, and CT imaging suggesting loosening of hardware. She had culture negative aspirate cx in Oct 2019. She chose to do chronic oral suppression with cephalexin instead of surgery. MRI this past fall shows 12 x 8.5 x 7.3cm of largely cystic lesion communicating to joint. She underwent repeat aspiration was again NGTD. She underwent resection 02-09-19 and was d/c home with vanco/ceftriaxone. Her Cx ultimately grew P acnes. She was changed to continuous infusion Penicillin on 02-24-19.  She has been doing ok (took stitches out this am, sling off). Would rather have single infusion daily.  04-04-19            [id number] CRP 0.7                      0.62.3 ESR    48                    5853  PIC removed 04-2019 and was changed to po keflex.  Is going to outpt rehab, still has some limitation of range of motion.  No problems with keflex.   Being worked up for uterine thickening.  Has hx of "spot on kidney", has f/u with uro.   Has gotten COVID vax x2.   Review of Systems  Constitutional: Negative for chills and fever.  Gastrointestinal: Negative for constipation and diarrhea.  Genitourinary: Negative for difficulty urinating.  Musculoskeletal: Positive for arthralgias. Negative for joint swelling.  Please see HPI. All other systems reviewed and negative.      Objective:   Physical Exam Constitutional:      General: She is not in acute distress.    Appearance: Normal appearance. She is not toxic-appearing.  HENT:     Mouth/Throat:     Mouth: Mucous membranes are moist.     Pharynx: No oropharyngeal exudate.  Eyes:     Extraocular Movements: Extraocular movements intact.     Pupils:  Pupils are equal, round, and reactive to light.  Cardiovascular:     Rate and Rhythm: Normal rate and regular rhythm.  Pulmonary:     Effort: Pulmonary effort is normal.     Breath sounds: Normal breath sounds.  Abdominal:     General: Bowel sounds are normal. There is no distension.     Palpations: Abdomen is soft.     Tenderness: There is no abdominal tenderness.  Musculoskeletal:        General: No swelling or tenderness.     Cervical back: Normal range of motion and neck supple.     Comments: L shoulder is non-swollen, non-erythematous, no fluid, non-tender.   Neurological:     Mental Status: She is alert.  Psychiatric:        Mood and Affect: Mood normal.           Assessment & Plan:

## 2019-07-19 NOTE — Assessment & Plan Note (Signed)
prolia injection soon, I agree that this is low risk for worsening her shoulder.  Will have f/u with pcp.

## 2019-07-20 ENCOUNTER — Telehealth: Payer: Self-pay | Admitting: Infectious Diseases

## 2019-07-20 LAB — HEMOGLOBIN A1C
Hgb A1c MFr Bld: 5.9 % of total Hgb — ABNORMAL HIGH (ref <5.7)
Mean Plasma Glucose: 123 (calc)
eAG (mmol/L): 6.8 (calc)

## 2019-07-20 LAB — SEDIMENTATION RATE: Sed Rate: 29 mm/h (ref 0–30)

## 2019-07-20 LAB — C-REACTIVE PROTEIN: CRP: 2 mg/L (ref <8.0)

## 2019-07-20 NOTE — Telephone Encounter (Signed)
Called pt and left VM with her lab results.  Re-iterated that she can remain off antibiotics.

## 2019-07-26 ENCOUNTER — Other Ambulatory Visit: Payer: Self-pay | Admitting: Obstetrics and Gynecology

## 2019-07-26 ENCOUNTER — Encounter: Payer: Self-pay | Admitting: Physician Assistant

## 2019-07-26 ENCOUNTER — Ambulatory Visit: Payer: Medicare PPO | Admitting: Physician Assistant

## 2019-07-26 ENCOUNTER — Other Ambulatory Visit: Payer: Self-pay

## 2019-07-26 ENCOUNTER — Telehealth: Payer: Self-pay | Admitting: Physician Assistant

## 2019-07-26 ENCOUNTER — Other Ambulatory Visit: Payer: Self-pay | Admitting: Family Medicine

## 2019-07-26 DIAGNOSIS — L821 Other seborrheic keratosis: Secondary | ICD-10-CM | POA: Diagnosis not present

## 2019-07-26 DIAGNOSIS — Z1231 Encounter for screening mammogram for malignant neoplasm of breast: Secondary | ICD-10-CM

## 2019-07-26 DIAGNOSIS — M810 Age-related osteoporosis without current pathological fracture: Secondary | ICD-10-CM

## 2019-07-26 DIAGNOSIS — Z1283 Encounter for screening for malignant neoplasm of skin: Secondary | ICD-10-CM

## 2019-07-26 DIAGNOSIS — L910 Hypertrophic scar: Secondary | ICD-10-CM

## 2019-07-26 NOTE — Telephone Encounter (Signed)
Patient returning phone call from [facility]; was seen in office today, 07/26/19 by Robyne Askew. Patient says the message was muffled and could not understand what was being said. Patient thinks she could have left her cell phone in our office and is asking if this is why someone from [facility] called her or if she was called by mistake. Patient says to leave a detailed message if does not answer. Chart [phone]

## 2019-08-02 ENCOUNTER — Encounter: Payer: Self-pay | Admitting: Physician Assistant

## 2019-08-02 NOTE — Progress Notes (Signed)
   Follow-Up Visit   Subjective  Tina Frank is a 79 y.o. female who presents for the following: Annual Exam (skin check yearly).   The following portions of the chart were reviewed this encounter and updated as appropriate: Tobacco  Allergies  Meds  Problems  Med Hx  Surg Hx  Fam Hx      Objective  Well appearing patient in no apparent distress; mood and affect are within normal limits.  A full examination was performed including scalp, head, eyes, ears, nose, lips, neck, chest, axillae, abdomen, back, buttocks, bilateral upper extremities, bilateral lower extremities, hands, feet, fingers, toes, fingernails, and toenails. All findings within normal limits unless otherwise noted below.  Objective  Mid Back: Thick  pink growth-- thinner with injections  Objective  Waist up: No DN  Objective  Left Breast, Mid Back, Mid Parietal Scalp, Right Abdomen (side) - Upper: Stuck-on, waxy, tan-brown papules and plaques. --Discussed benign etiology and prognosis.    Assessment & Plan  Keloid Mid Back  observe  Screening exam for skin cancer Waist up  No atypical nevi  Seborrheic keratosis (4) Left Breast; Right Abdomen (side) - Upper; Mid Back; Mid Parietal Scalp  Skin check

## 2019-09-06 ENCOUNTER — Encounter: Payer: Self-pay | Admitting: Podiatry

## 2019-09-06 ENCOUNTER — Ambulatory Visit: Payer: Medicare PPO | Admitting: Podiatry

## 2019-09-06 ENCOUNTER — Other Ambulatory Visit: Payer: Self-pay

## 2019-09-06 DIAGNOSIS — E119 Type 2 diabetes mellitus without complications: Secondary | ICD-10-CM

## 2019-09-06 DIAGNOSIS — M79676 Pain in unspecified toe(s): Secondary | ICD-10-CM | POA: Diagnosis not present

## 2019-09-06 DIAGNOSIS — B351 Tinea unguium: Secondary | ICD-10-CM | POA: Diagnosis not present

## 2019-09-06 NOTE — Progress Notes (Signed)
This patient returns to my office for at risk foot care.  This patient requires this care by a professional since this patient will be at risk due to having diabetes type 2.  This patient is unable to cut nails herself since the patient cannot reach her nails.These nails are painful walking and wearing shoes.  This patient presents for at risk foot care today.  General Appearance  Alert, conversant and in no acute stress.  Vascular  Dorsalis pedis and posterior tibial  pulses are palpable  bilaterally.  Capillary return is within normal limits  bilaterally. Temperature is within normal limits  bilaterally.  Neurologic  Senn-Weinstein monofilament wire test within normal limits  bilaterally. Muscle power within normal limits bilaterally.  Nails Thick disfigured discolored nails with subungual debris  from hallux to fifth toes bilaterally. No evidence of bacterial infection or drainage bilaterally.  Orthopedic  No limitations of motion  feet .  No crepitus or effusions noted.  No bony pathology or digital deformities noted.  Skin  normotropic skin with no porokeratosis noted bilaterally.  No signs of infections or ulcers noted.     Onychomycosis  Pain in right toes  Pain in left toes  Consent was obtained for treatment procedures.   Mechanical debridement of nails 1-5  bilaterally performed with a nail nipper.  Filed with dremel without incident.    Return office visit      9 weeks               Told patient to return for periodic foot care and evaluation due to potential at risk complications.   Zarie Kosiba DPM  

## 2019-09-13 ENCOUNTER — Ambulatory Visit
Admission: RE | Admit: 2019-09-13 | Discharge: 2019-09-13 | Disposition: A | Discharge status: Home / Self Care | Payer: Medicare PPO | Source: Ambulatory Visit | Attending: Family Medicine | Admitting: Family Medicine

## 2019-09-13 ENCOUNTER — Other Ambulatory Visit: Payer: Self-pay

## 2019-09-13 DIAGNOSIS — M810 Age-related osteoporosis without current pathological fracture: Secondary | ICD-10-CM

## 2019-09-20 ENCOUNTER — Encounter (HOSPITAL_BASED_OUTPATIENT_CLINIC_OR_DEPARTMENT_OTHER): Payer: Self-pay | Admitting: Obstetrics and Gynecology

## 2019-09-20 ENCOUNTER — Other Ambulatory Visit: Payer: Self-pay

## 2019-09-20 ENCOUNTER — Other Ambulatory Visit: Payer: Self-pay | Admitting: Obstetrics and Gynecology

## 2019-09-20 NOTE — Progress Notes (Signed)
NEW Covid Policy July 0413  Surgery Day:   09-28-2019  Facility:  [facility]  Type of Surgery: D & C Hysteroscopy  Fully Covid Vaccinated:   1) 04-23-2019                                          2) 05-21-2019                                          Where? [facility]                                           Type? Moderna Not Covid Vaccinated:    Do you have symptoms? No  In the past 14 days:        Have you had any symptoms? NO       Have you been tested covid positive? NO       Have you been in contact with someone covid positive? NO        Is pt Immuno-compromised? NO

## 2019-09-20 NOTE — Progress Notes (Addendum)
ADDENDUM:  Received nephrologist, dr Tina Frank lov , placed in chart.   Spoke w/ via phone for pre-op interview--- PT Lab needs dos---- Istat (anes)/  Pre-op orders pending               Lab results------ current ekg in chart/ epic COVID test ------ fully vaccinated, no test needed.  Pt aware to bring vaccine card dos to be verified. Arrive at ------- 0745 NPO after ------  MN w/ exception clear liquids until 0645 then nothing by mouth Medications to take morning of surgery ----- Protonix, Coreg w/ sips of water Diabetic medication ----- n/a Patient Special Instructions ----- advised pt to call Dr Tina Frank office and ask abouth ASA 81mg  if needed to stop or not and follow those instructions Pre-Op special Istructions ----- requested pre-op orders in epic via inbox message to dr Tina Frank Patient verbalized understanding of instructions that were given at this phone interview. Patient denies shortness of breath, chest pain, fever, cough a this phone interview.   PCP: Dr Tina Frank Nephrologist:   Dr Tina Frank for CKD/ HTN (per pt lov 02/ 2021, requested note via fax) ID:  Dr Tina Frank  (lov 07-19-2019 epic) Cardiologist : no Chest x-ray : 10-05-2013 epic EKG :  02-02-2019  Epic Echo :  10-06-2013 epic Cardiac Cath :  no Activity level:   Able to perform normal activity with no sob Sleep Study/ CPAP :  NO Fasting Blood Sugar :      / Checks Blood Sugar -- times a day:   Per pt does not check blood sugar's Blood Thinner/ Instructions /Last Dose:  NO ASA / Instructions/ Last Dose :  ASA 81mg /  Pt was not given any instructions /  Per pt she will call dr Tina Frank and get instructions

## 2019-09-24 ENCOUNTER — Other Ambulatory Visit (HOSPITAL_COMMUNITY)
Admission: RE | Admit: 2019-09-24 | Discharge: 2019-09-24 | Disposition: A | Discharge status: Home / Self Care | Payer: Medicare PPO | Source: Ambulatory Visit | Attending: Obstetrics and Gynecology | Admitting: Obstetrics and Gynecology

## 2019-09-24 DIAGNOSIS — Z20822 Contact with and (suspected) exposure to covid-19: Secondary | ICD-10-CM | POA: Insufficient documentation

## 2019-09-24 DIAGNOSIS — Z01812 Encounter for preprocedural laboratory examination: Secondary | ICD-10-CM | POA: Insufficient documentation

## 2019-09-24 LAB — SARS CORONAVIRUS 2 (TAT 6-24 HRS): SARS Coronavirus 2: NEGATIVE

## 2019-09-28 ENCOUNTER — Encounter (HOSPITAL_BASED_OUTPATIENT_CLINIC_OR_DEPARTMENT_OTHER): Payer: Self-pay | Admitting: Obstetrics and Gynecology

## 2019-09-28 ENCOUNTER — Other Ambulatory Visit: Payer: Self-pay

## 2019-09-28 ENCOUNTER — Ambulatory Visit (HOSPITAL_BASED_OUTPATIENT_CLINIC_OR_DEPARTMENT_OTHER): Payer: Medicare PPO | Admitting: Anesthesiology

## 2019-09-28 ENCOUNTER — Encounter (HOSPITAL_BASED_OUTPATIENT_CLINIC_OR_DEPARTMENT_OTHER)
Admission: RE | Disposition: A | Discharge status: Home / Self Care | Payer: Self-pay | Source: Home / Self Care | Attending: Obstetrics and Gynecology

## 2019-09-28 ENCOUNTER — Ambulatory Visit (HOSPITAL_BASED_OUTPATIENT_CLINIC_OR_DEPARTMENT_OTHER)
Admission: RE | Admit: 2019-09-28 | Discharge: 2019-09-28 | Disposition: A | Discharge status: Home / Self Care | Payer: Medicare PPO | Attending: Obstetrics and Gynecology | Admitting: Obstetrics and Gynecology

## 2019-09-28 DIAGNOSIS — Z87891 Personal history of nicotine dependence: Secondary | ICD-10-CM | POA: Insufficient documentation

## 2019-09-28 DIAGNOSIS — Z96612 Presence of left artificial shoulder joint: Secondary | ICD-10-CM | POA: Insufficient documentation

## 2019-09-28 DIAGNOSIS — Z79899 Other long term (current) drug therapy: Secondary | ICD-10-CM | POA: Diagnosis not present

## 2019-09-28 DIAGNOSIS — K219 Gastro-esophageal reflux disease without esophagitis: Secondary | ICD-10-CM | POA: Diagnosis not present

## 2019-09-28 DIAGNOSIS — Z7982 Long term (current) use of aspirin: Secondary | ICD-10-CM | POA: Diagnosis not present

## 2019-09-28 DIAGNOSIS — M1990 Unspecified osteoarthritis, unspecified site: Secondary | ICD-10-CM | POA: Insufficient documentation

## 2019-09-28 DIAGNOSIS — E785 Hyperlipidemia, unspecified: Secondary | ICD-10-CM | POA: Insufficient documentation

## 2019-09-28 DIAGNOSIS — N840 Polyp of corpus uteri: Secondary | ICD-10-CM | POA: Insufficient documentation

## 2019-09-28 DIAGNOSIS — E1122 Type 2 diabetes mellitus with diabetic chronic kidney disease: Secondary | ICD-10-CM | POA: Diagnosis not present

## 2019-09-28 DIAGNOSIS — I129 Hypertensive chronic kidney disease with stage 1 through stage 4 chronic kidney disease, or unspecified chronic kidney disease: Secondary | ICD-10-CM | POA: Diagnosis not present

## 2019-09-28 DIAGNOSIS — N182 Chronic kidney disease, stage 2 (mild): Secondary | ICD-10-CM | POA: Diagnosis not present

## 2019-09-28 DIAGNOSIS — Z8249 Family history of ischemic heart disease and other diseases of the circulatory system: Secondary | ICD-10-CM | POA: Diagnosis not present

## 2019-09-28 HISTORY — DX: Type 2 diabetes mellitus without complications: E11.9

## 2019-09-28 HISTORY — PX: DILATATION & CURETTAGE/HYSTEROSCOPY WITH MYOSURE: SHX6511

## 2019-09-28 HISTORY — DX: Personal history of other endocrine, nutritional and metabolic disease: Z86.39

## 2019-09-28 HISTORY — DX: Unspecified osteoarthritis, unspecified site: M19.90

## 2019-09-28 HISTORY — DX: Age-related osteoporosis without current pathological fracture: M81.0

## 2019-09-28 HISTORY — DX: Chronic kidney disease, stage 2 (mild): N18.2

## 2019-09-28 HISTORY — DX: Infection and inflammatory reaction due to unspecified internal joint prosthesis, initial encounter: T84.50XA

## 2019-09-28 LAB — POCT I-STAT, CHEM 8
BUN: 38 mg/dL — ABNORMAL HIGH (ref 8–23)
Calcium, Ion: 1.26 mmol/L (ref 1.15–1.40)
Chloride: 103 mmol/L (ref 98–111)
Creatinine, Ser: 1 mg/dL (ref 0.44–1.00)
Glucose, Bld: 125 mg/dL — ABNORMAL HIGH (ref 70–99)
HCT: 42 % (ref 36.0–46.0)
Hemoglobin: 14.3 g/dL (ref 12.0–15.0)
Potassium: 4.6 mmol/L (ref 3.5–5.1)
Sodium: 140 mmol/L (ref 135–145)
TCO2: 24 mmol/L (ref 22–32)

## 2019-09-28 LAB — CBC
HCT: 42.4 % (ref 36.0–46.0)
Hemoglobin: 13.5 g/dL (ref 12.0–15.0)
MCH: 27 pg (ref 26.0–34.0)
MCHC: 31.8 g/dL (ref 30.0–36.0)
MCV: 84.8 fL (ref 80.0–100.0)
Platelets: 177 10*3/uL (ref 150–400)
RBC: 5 MIL/uL (ref 3.87–5.11)
RDW: 15.1 % (ref 11.5–15.5)
WBC: 6.5 10*3/uL (ref 4.0–10.5)
nRBC: 0 % (ref 0.0–0.2)

## 2019-09-28 LAB — TYPE AND SCREEN
ABO/RH(D): O POS
Antibody Screen: NEGATIVE

## 2019-09-28 LAB — GLUCOSE, CAPILLARY: Glucose-Capillary: 156 mg/dL — ABNORMAL HIGH (ref 70–99)

## 2019-09-28 SURGERY — DILATATION & CURETTAGE/HYSTEROSCOPY WITH MYOSURE
Anesthesia: General | Site: Uterus

## 2019-09-28 MED ORDER — OXYCODONE HCL 5 MG/5ML PO SOLN: 5.0000 mg | Freq: Once | ORAL | Status: DC | PRN

## 2019-09-28 MED ORDER — BUPIVACAINE HCL (PF) 0.25 % IJ SOLN
INTRAMUSCULAR | Status: DC | PRN
  Administered 2019-09-28: 20 mL

## 2019-09-28 MED ORDER — FENTANYL CITRATE (PF) 100 MCG/2ML IJ SOLN
INTRAMUSCULAR | Status: DC | PRN
  Administered 2019-09-28: 50 ug via INTRAVENOUS
  Administered 2019-09-28 (×2): 25 ug via INTRAVENOUS

## 2019-09-28 MED ORDER — FENTANYL CITRATE (PF) 100 MCG/2ML IJ SOLN
INTRAMUSCULAR | Status: AC
  Filled 2019-09-28: qty 2

## 2019-09-28 MED ORDER — BUPIVACAINE HCL (PF) 0.25 % IJ SOLN
INTRAMUSCULAR | Status: AC
  Filled 2019-09-28: qty 30

## 2019-09-28 MED ORDER — LACTATED RINGERS IV SOLN
INTRAVENOUS | Status: DC
  Administered 2019-09-28: 10 mL via INTRAVENOUS

## 2019-09-28 MED ORDER — ONDANSETRON HCL 4 MG/2ML IJ SOLN
INTRAMUSCULAR | Status: AC
  Filled 2019-09-28: qty 2

## 2019-09-28 MED ORDER — SODIUM CHLORIDE 0.9 % IR SOLN
Status: DC | PRN
  Administered 2019-09-28: 620 mL

## 2019-09-28 MED ORDER — VASOPRESSIN 20 UNIT/ML IV SOLN
INTRAVENOUS | Status: AC
  Filled 2019-09-28: qty 1

## 2019-09-28 MED ORDER — SODIUM CHLORIDE (PF) 0.9 % IJ SOLN
INTRAMUSCULAR | Status: AC
  Filled 2019-09-28: qty 50

## 2019-09-28 MED ORDER — KETOROLAC TROMETHAMINE 30 MG/ML IJ SOLN
INTRAMUSCULAR | Status: AC
  Filled 2019-09-28: qty 1

## 2019-09-28 MED ORDER — CEFAZOLIN SODIUM-DEXTROSE 2-4 GM/100ML-% IV SOLN
INTRAVENOUS | Status: AC
  Filled 2019-09-28: qty 100

## 2019-09-28 MED ORDER — VASOPRESSIN 20 UNIT/ML IV SOLN
INTRAVENOUS | Status: DC | PRN
  Administered 2019-09-28: 18 mL via INTRAMUSCULAR

## 2019-09-28 MED ORDER — PROPOFOL 10 MG/ML IV BOLUS
INTRAVENOUS | Status: AC
  Filled 2019-09-28: qty 20

## 2019-09-28 MED ORDER — PROPOFOL 10 MG/ML IV BOLUS
INTRAVENOUS | Status: DC | PRN
  Administered 2019-09-28: 100 mg via INTRAVENOUS

## 2019-09-28 MED ORDER — TRAMADOL HCL 50 MG PO TABS
50.0000 mg | ORAL_TABLET | Freq: Four times a day (QID) | ORAL | 0 refills | Status: DC | PRN
Start: 2019-09-28 — End: 2022-04-25

## 2019-09-28 MED ORDER — LIDOCAINE HCL (CARDIAC) PF 100 MG/5ML IV SOSY
PREFILLED_SYRINGE | INTRAVENOUS | Status: DC | PRN
  Administered 2019-09-28: 100 mg via INTRAVENOUS

## 2019-09-28 MED ORDER — OXYCODONE HCL 5 MG PO TABS: 5.0000 mg | ORAL_TABLET | Freq: Once | ORAL | Status: DC | PRN

## 2019-09-28 MED ORDER — ONDANSETRON HCL 4 MG/2ML IJ SOLN: 4.0000 mg | Freq: Once | INTRAMUSCULAR | Status: DC | PRN

## 2019-09-28 MED ORDER — CEFAZOLIN SODIUM-DEXTROSE 2-4 GM/100ML-% IV SOLN
2.0000 g | INTRAVENOUS | Status: AC
  Administered 2019-09-28: 2 g via INTRAVENOUS

## 2019-09-28 MED ORDER — LIDOCAINE 2% (20 MG/ML) 5 ML SYRINGE
INTRAMUSCULAR | Status: AC
  Filled 2019-09-28: qty 5

## 2019-09-28 MED ORDER — KETOROLAC TROMETHAMINE 30 MG/ML IJ SOLN
INTRAMUSCULAR | Status: DC | PRN
  Administered 2019-09-28: 15 mg via INTRAVENOUS

## 2019-09-28 MED ORDER — FENTANYL CITRATE (PF) 100 MCG/2ML IJ SOLN: 25.0000 ug | INTRAMUSCULAR | Status: DC | PRN

## 2019-09-28 MED ORDER — POVIDONE-IODINE 10 % EX SWAB: 2.0000 "application " | Freq: Once | CUTANEOUS | Status: DC

## 2019-09-28 MED ORDER — ONDANSETRON HCL 4 MG/2ML IJ SOLN
INTRAMUSCULAR | Status: DC | PRN
  Administered 2019-09-28: 4 mg via INTRAVENOUS

## 2019-09-28 SURGICAL SUPPLY — 20 items
CATH ROBINSON RED A/P 16FR (CATHETERS) ×3 IMPLANT
CNTNR URN SCR LID CUP LEK RST (MISCELLANEOUS) IMPLANT
CONT SPEC 4OZ STRL OR WHT (MISCELLANEOUS) ×3
DECANTER SPIKE VIAL GLASS SM (MISCELLANEOUS) ×4 IMPLANT
DEVICE MYOSURE LITE (MISCELLANEOUS) IMPLANT
DEVICE MYOSURE REACH (MISCELLANEOUS) ×2 IMPLANT
GLOVE BIO SURGEON STRL SZ 6.5 (GLOVE) ×1 IMPLANT
GLOVE BIO SURGEON STRL SZ7.5 (GLOVE) ×3 IMPLANT
GLOVE BIO SURGEONS STRL SZ 6.5 (GLOVE) ×1
GLOVE BIOGEL PI IND STRL 7.0 (GLOVE) ×1 IMPLANT
GLOVE BIOGEL PI INDICATOR 7.0 (GLOVE) ×6
GOWN STRL REUS W/TWL LRG LVL3 (GOWN DISPOSABLE) ×6 IMPLANT
IV NS IRRIG 3000ML ARTHROMATIC (IV SOLUTION) ×3 IMPLANT
KIT PROCEDURE FLUENT (KITS) ×3 IMPLANT
NDL SPNL 22GX3.5 QUINCKE BK (NEEDLE) ×1 IMPLANT
NEEDLE SPNL 22GX3.5 QUINCKE BK (NEEDLE) ×3 IMPLANT
PACK VAGINAL MINOR WOMEN LF (CUSTOM PROCEDURE TRAY) ×3 IMPLANT
PAD OB MATERNITY 4.3X12.25 (PERSONAL CARE ITEMS) ×3 IMPLANT
SEAL CERVICAL OMNI LOK (ABLATOR) IMPLANT
SEAL ROD LENS SCOPE MYOSURE (ABLATOR) ×3 IMPLANT

## 2019-09-28 NOTE — H&P (Signed)
Tina Frank is an 79 y.o. female. PMB with endometrial mass.  Pertinent Gynecological History: Menses: post-menopausal Bleeding: post menopausal bleeding Contraception: none DES exposure: denies Blood transfusions: none Sexually transmitted diseases: no past history Previous GYN Procedures: DNC  Last mammogram: normal Date: 2020 Last pap: normal Date: 2020 OB History: G2, P2   Menstrual History: Menarche age: 1 No LMP recorded. Patient is postmenopausal.    Past Medical History:  Diagnosis Date  . CKD (chronic kidney disease), stage II    nephrologist-- dr Moshe Cipro  [facility])  . Diabetes mellitus type 2, diet-controlled ([place])    followed by pcp---   (09-20-2019  per pt does not check blood sugar at home)  . GERD (gastroesophageal reflux disease)   . History of primary hyperparathyroidism    s/p  parathyroidectomy right inferior on 12-02-2012  . Hyperlipidemia   . Hypertension    followd by nephrologist----  (09-20-2019  per pt had stress test done some time ago , unsure when/ where, thinks told ok)  . OA (osteoarthritis)   . Osteoporosis   . Prosthetic joint infection ([place])    followed by dr j. hatcher ([place])   left total shoulder arthroplasty 12/ 2016  ; 12/ 2020  revision with explant joint replacement and hemiarthroplasty;  completed 6 month antibiotic 05/ 2021    Past Surgical History:  Procedure Laterality Date  . COLONOSCOPY    . EYE SURGERY  yrs ago   laser surgery for retinal tear.  (unilateral , pt unsure which side)  . PARATHYROIDECTOMY N/A 12/02/2012   Procedure: PARATHYROIDECTOMY with frozen section ;  Surgeon: Earnstine Regal, MD;  Location: [facility];  Service: General;  Laterality: N/A;  . SHOULDER INJECTION Left 07/27/2013   Procedure: SHOULDER INJECTION;  Surgeon: [name], MD;  Location: [place];  Service: Orthopedics;  Laterality: Left;  . STERIOD INJECTION Right 02/14/2015   Procedure: RIGHT SHOULDER CORTISONE INJECTION;  Surgeon: [name], MD;  Location: [place];  Service: Orthopedics;  Laterality: Right;  . TOTAL HIP ARTHROPLASTY Left 07/27/2013   Procedure: TOTAL HIP ARTHROPLASTY ANTERIOR APPROACH;  Surgeon: [name], MD;  Location: [place];  Service: Orthopedics;  Laterality: Left;  . TOTAL SHOULDER ARTHROPLASTY Left 02/14/2015   Procedure: LEFT TOTAL SHOULDER ARTHROPLASTY;  Surgeon: [name], MD;  Location: [place];  Service: Orthopedics;  Laterality: Left;  . TOTAL SHOULDER REVISION Left 02/09/2019   Procedure: TOTAL SHOULDER REVISION;  Surgeon: Hiram Gash, MD;  Location: [facility];  Service: Orthopedics;  Laterality: Left;    Family History  Problem Relation Age of Onset  . Heart failure Mother   . Cancer Mother   . Cancer Father   . Breast cancer Neg Hx     Social History:  reports that she quit smoking about 31 years ago. Her smoking use included cigarettes. She has a 20.00 pack-year smoking history. She has never used smokeless tobacco. She reports that she does not drink alcohol and does not use drugs.  Allergies:  Allergies  Allergen Reactions  . Crestor [Rosuvastatin] Other (See Comments)    Muscle aches  . Norvasc [Amlodipine Besylate] Swelling    Ankle swelling  . Simvastatin Other (See Comments)    Muscle aches  . Sulfa Antibiotics Rash  . Zetia [Ezetimibe] Other (See Comments)    Muscle aches    Medications Prior to Admission  Medication Sig Dispense Refill Last Dose  . aspirin EC 81 MG tablet Take 81  mg by mouth daily.   09/27/2019 at Unknown time  . atorvastatin (LIPITOR) 10 MG tablet Take 10 mg by mouth at bedtime.    09/27/2019 at Unknown time  . calcium carbonate (TUMS - DOSED IN MG ELEMENTAL CALCIUM) 500 MG chewable tablet Chew 1 tablet by mouth 3 (three) times daily as needed for indigestion or heartburn.   09/27/2019 at Unknown time  . carvedilol (COREG) 12.5 MG tablet Take 12.5 mg by mouth 2 (two) times daily with a meal.   0 09/28/2019 at 0630  . cloNIDine (CATAPRES) 0.1 MG  tablet Take 0.1 mg by mouth daily as needed (if systolic bp is over 440).    Past Month at Unknown time  . denosumab (PROLIA) 60 MG/ML SOSY injection Inject 60 mg into the skin every 6 (six) months. 1 mL 0   . furosemide (LASIX) 40 MG tablet Take 40 mg by mouth 2 (two) times daily.   2 09/27/2019 at Unknown time  . pantoprazole (PROTONIX) 40 MG tablet Take 40 mg by mouth See admin instructions. Take 40 mg once daily, may take a second 40 mg dose as needed for acid reflux   09/28/2019 at 0630  . telmisartan (MICARDIS) 80 MG tablet Take 0.5 tablets (40 mg total) by mouth daily. (Patient taking differently: Take 40 mg by mouth 2 (two) times daily. ) 30 tablet 1 09/27/2019 at Unknown time  . hydrALAZINE (APRESOLINE) 50 MG tablet Take 50 mg by mouth 2 (two) times daily. (Patient not taking: Reported on 09/20/2019)  0 09/27/2019 at 1700    Review of Systems  Constitutional: Negative.   All other systems reviewed and are negative.   Height 5\' 5"  (1.651 m), weight 67.1 kg. Physical Exam Vitals and nursing note reviewed.  Constitutional:      Appearance: Normal appearance.  HENT:     Head: Normocephalic and atraumatic.  Cardiovascular:     Rate and Rhythm: Normal rate and regular rhythm.  Pulmonary:     Effort: Pulmonary effort is normal.     Breath sounds: Normal breath sounds.  Abdominal:     General: Abdomen is flat.     Palpations: Abdomen is soft.  Genitourinary:    General: Normal vulva.  Musculoskeletal:        General: Normal range of motion.     Cervical back: Normal range of motion and neck supple.  Skin:    General: Skin is warm and dry.  Neurological:     General: No focal deficit present.     Mental Status: She is alert and oriented to person, place, and time.  Psychiatric:        Mood and Affect: Mood normal.        Behavior: Behavior normal.     No results found for this or any previous visit (from the past 24 hour(s)).  No results found.  Assessment/Plan: PMB with  endometrial mass Diag HS, D&C with myosure Consent done.  Victory Dresden J 09/28/2019, 8:34 AM

## 2019-09-28 NOTE — Anesthesia Postprocedure Evaluation (Signed)
Anesthesia Post Note  Patient: [place]  Procedure(s) Performed: DILATATION & CURETTAGE/HYSTEROSCOPY WITH MYOSURE (N/A Uterus)     Patient location during evaluation: PACU Anesthesia Type: General Level of consciousness: awake and alert Pain management: pain level controlled Vital Signs Assessment: post-procedure vital signs reviewed and stable Respiratory status: spontaneous breathing, nonlabored ventilation and respiratory function stable Cardiovascular status: blood pressure returned to baseline and stable Postop Assessment: no apparent nausea or vomiting Anesthetic complications: no   No complications documented.  Last Vitals:  Vitals:   09/28/19 1045 09/28/19 1100  BP: (!) 147/63 (!) 142/72  Pulse: (!) 50 (!) 48  Resp: 13 14  Temp:    SpO2: 95% 94%    Last Pain:  Vitals:   09/28/19 0832  TempSrc: Oral  PainSc: Christian

## 2019-09-28 NOTE — Transfer of Care (Signed)
Immediate Anesthesia Transfer of Care Note  Patient: Tina Frank  Procedure(s) Performed: DILATATION & CURETTAGE/HYSTEROSCOPY WITH MYOSURE (N/A Uterus)  Patient Location: PACU  Anesthesia Type:General  Level of Consciousness: awake, alert , oriented and patient cooperative  Airway & Oxygen Therapy: Patient Spontanous Breathing and Patient connected to nasal cannula oxygen  Post-op Assessment: Report given to RN, Post -op Vital signs reviewed and stable and Patient moving all extremities X 4  Post vital signs: Reviewed and stable  Last Vitals:  Vitals Value Taken Time  BP    Temp    Pulse 53 09/28/19 1016  Resp 13 09/28/19 1016  SpO2 99 % 09/28/19 1016  Vitals shown include unvalidated device data.  Last Pain:  Vitals:   09/28/19 0832  TempSrc: Oral  PainSc: 2       Patients Stated Pain Goal: 5 (18/84/16 6063)  Complications: No complications documented.

## 2019-09-28 NOTE — Anesthesia Preprocedure Evaluation (Signed)
Anesthesia Evaluation  Patient identified by MRN, date of birth, ID band Patient awake    Reviewed: Allergy & Precautions, NPO status , Patient's Chart, lab work & pertinent test results  History of Anesthesia Complications Negative for: history of anesthetic complications  Airway Mallampati: II  TM Distance: >3 FB Neck ROM: Full    Dental  (+) Teeth Intact   Pulmonary neg pulmonary ROS, former smoker,    Pulmonary exam normal        Cardiovascular hypertension, Normal cardiovascular exam     Neuro/Psych negative neurological ROS  negative psych ROS   GI/Hepatic Neg liver ROS, GERD  ,  Endo/Other  diabetes, Type 2  Renal/GU Renal InsufficiencyRenal disease  negative genitourinary   Musculoskeletal  (+) Arthritis ,   Abdominal   Peds  Hematology negative hematology ROS (+)   Anesthesia Other Findings  Echo 2015: EF 65-70%, mild TR  Reproductive/Obstetrics                            Anesthesia Physical Anesthesia Plan  ASA: III  Anesthesia Plan: General   Post-op Pain Management:    Induction: Intravenous  PONV Risk Score and Plan: 3 and Ondansetron, Dexamethasone and Treatment may vary due to age or medical condition  Airway Management Planned: LMA  Additional Equipment: None  Intra-op Plan:   Post-operative Plan: Extubation in OR  Informed Consent: I have reviewed the patients History and Physical, chart, labs and discussed the procedure including the risks, benefits and alternatives for the proposed anesthesia with the patient or authorized representative who has indicated his/her understanding and acceptance.     Dental advisory given  Plan Discussed with:   Anesthesia Plan Comments:        Anesthesia Quick Evaluation

## 2019-09-28 NOTE — Op Note (Signed)
09/28/2019  10:10 AM  PATIENT:  Bonnell Public  79 y.o. female  PRE-OPERATIVE DIAGNOSIS:  Endometrial Polyp  POST-OPERATIVE DIAGNOSIS:  Endometrial Polyp  PROCEDURE:  Procedure(s): DILATATION & CURETTAGE HYSTEROSCOPY WITH MYOSURE  RESECTION OF ENDOMETRIAL POLYP  SURGEON:  Surgeon(s): Brien Few, MD  ASSISTANTS: none   ANESTHESIA:   local and general  ESTIMATED BLOOD LOSS: minimal  DRAINS: none   LOCAL MEDICATIONS USED:  MARCAINE    and Amount: 20 ml  SPECIMEN:  Source of Specimen:  ENDOMETRIAL POLYP AND EMC  DISPOSITION OF SPECIMEN:  PATHOLOGY  COUNTS:  YES  DICTATION #: [id number]  PLAN OF CARE: DC HOME  PATIENT DISPOSITION:  PACU - hemodynamically stable.

## 2019-09-28 NOTE — Op Note (Signed)
NAME: YING, [place] MEDICAL RECORD [id number] ACCOUNT [id number] DATE OF BIRTH:[birth year] FACILITY: [facility] LOCATION: [facility] PHYSICIAN:Khilynn Borntreger J. Jamyria Ozanich, MD  OPERATIVE REPORT  DATE OF PROCEDURE:  09/28/2019  PREOPERATIVE DIAGNOSIS:  Postmenopausal bleeding with large endometrial mass.  POSTOPERATIVE DIAGNOSIS:  Endometrial polyp.  PROCEDURE:  Diagnostic hysteroscopy, dilatation and curettage, MyoSure resection of endometrial polyp.  SURGEON:  Brien Few, MD  ASSISTANT:  None  ANESTHESIA:  Local and general.  ESTIMATED BLOOD LOSS:  Less than 50 mL.  COMPLICATIONS:  None.  DRAINS:  None.  COUNTS:  Correct.  DISPOSITION:  The patient was taken to recovery in good condition.  SPECIMENS:  Endometrial polyp and endometrial curettings to pathology.  BRIEF OPERATIVE NOTE:  After being apprised of the risks of anesthesia, infection, bleeding, injury to surrounding organs, possible need for repair, delayed versus immediate complications to include bowel and bladder injury with possible need for repair,  uterine perforation with possible need for open abdominal exploration, the patient was brought to the operating room where she was administered a general anesthetic without complications.  Prepped and draped in usual sterile fashion, placed in dorsal  lithotomy position in Yellofin stirrups.  At this time exam under anesthesia reveals a cystocele and a small anteflexed uterus, no adnexal masses are appreciated.  Dilute Pitressin solution was placed, 20 mL total, at 3 and 9 o'clock.  Dilute Marcaine  solution for paracervical block.  Cervix easily dilated up to a 21 Pratt dilator.  Hysteroscope placed.  Visualization reveals a large posterior wall endometrial polyp, which was resected using multiple passes with the MyoSure Reach device.  Endometrial  curettings were collected using sharp curettage.  Minimal bleeding noted.  Normal tubal ostia noted.  Otherwise, normal endometrial  cavity after complete resection of the polyp.  FLUID DEFICIT:  50.    DISPOSITION:  The patient tolerated the procedure well and was transferred to recovery in good condition.  CN/NUANCE  D:09/28/2019 T:09/28/2019 JOB:[id number]

## 2019-09-28 NOTE — Anesthesia Procedure Notes (Signed)
Procedure Name: LMA Insertion Date/Time: 09/28/2019 9:50 AM Performed by: Jonna Munro, CRNA Pre-anesthesia Checklist: Patient identified, Emergency Drugs available, Suction available, Patient being monitored and Timeout performed Patient Re-evaluated:Patient Re-evaluated prior to induction Oxygen Delivery Method: Circle system utilized Preoxygenation: Pre-oxygenation with 100% oxygen Induction Type: IV induction LMA: LMA inserted LMA Size: 3.0 Number of attempts: 1 Placement Confirmation: positive ETCO2 and breath sounds checked- equal and bilateral Dental Injury: Teeth and Oropharynx as per pre-operative assessment

## 2019-09-28 NOTE — Discharge Instructions (Signed)
DISCHARGE INSTRUCTIONS: D&C / D&E The following instructions have been prepared to help you care for yourself upon your return home.   Personal hygiene: Marland Kitchen Use sanitary pads for vaginal drainage, not tampons. . Shower the day after your procedure. . NO tub baths, pools or Jacuzzis for 2-3 weeks. . Wipe front to back after using the bathroom.  Activity and limitations: . Do NOT drive or operate any equipment for 24 hours. The effects of anesthesia are still present and drowsiness may result. . Do NOT rest in bed all day. . Walking is encouraged. . Walk up and down stairs slowly. . You may resume your normal activity in one to two days or as indicated by your physician.  Sexual activity: NO intercourse for at least 2 weeks after the procedure, or as indicated by your physician.  Diet: Eat a light meal as desired this evening. You may resume your usual diet tomorrow.  Return to work: You may resume your work activities in one to two days or as indicated by your doctor.  What to expect after your surgery: Expect to have vaginal bleeding/discharge for 2-3 days and spotting for up to 10 days. It is not unusual to have soreness for up to 1-2 weeks. You may have a slight burning sensation when you urinate for the first day. Mild cramps may continue for a couple of days. You may have a regular period in 2-6 weeks.  Call your doctor for any of the following: . Excessive vaginal bleeding, saturating and changing one pad every hour. . Inability to urinate 6 hours after discharge from hospital. . Pain not relieved by pain medication. . Fever of 100.4 F or greater. . Unusual vaginal discharge or odor.   Post Anesthesia Home Care Instructions  Activity: Get plenty of rest for the remainder of the day. A responsible individual must stay with you for 24 hours following the procedure.  For the next 24 hours, DO NOT: -Drive a car -Paediatric nurse -Drink alcoholic beverages -Take any medication  unless instructed by your physician -Make any legal decisions or sign important papers.  Meals: Start with liquid foods such as gelatin or soup. Progress to regular foods as tolerated. Avoid greasy, spicy, heavy foods. If nausea and/or vomiting occur, drink only clear liquids until the nausea and/or vomiting subsides. Call your physician if vomiting continues.  Special Instructions/Symptoms: Your throat may feel dry or sore from the anesthesia or the breathing tube placed in your throat during surgery. If this causes discomfort, gargle with warm salt water. The discomfort should disappear within 24 hours.  No ibuprofen, Advil, Aleve, Motrin, or naproxen until after 6:00 pm today if needed.

## 2019-09-29 ENCOUNTER — Encounter (HOSPITAL_BASED_OUTPATIENT_CLINIC_OR_DEPARTMENT_OTHER): Payer: Self-pay | Admitting: Obstetrics and Gynecology

## 2019-09-29 LAB — SURGICAL PATHOLOGY

## 2019-10-11 ENCOUNTER — Telehealth: Payer: Self-pay

## 2019-10-11 ENCOUNTER — Other Ambulatory Visit: Payer: Self-pay | Admitting: Infectious Diseases

## 2019-10-11 DIAGNOSIS — T8450XD Infection and inflammatory reaction due to unspecified internal joint prosthesis, subsequent encounter: Secondary | ICD-10-CM

## 2019-10-11 NOTE — Telephone Encounter (Signed)
Orders entered thanks

## 2019-10-11 NOTE — Telephone Encounter (Signed)
Patient called stating she feels similarly to when she had infection previously. Doesn't report fever but that she "breaks out in a cold sweat" often and is concerned that the infection has returned. Follow up appointment at the end of August (earliest available) but was hoping to see if lab work could be done prior to appointment.   Alece Koppel Lorita Officer, RN

## 2019-10-13 ENCOUNTER — Other Ambulatory Visit: Payer: Self-pay

## 2019-10-13 ENCOUNTER — Other Ambulatory Visit: Payer: Medicare PPO

## 2019-10-13 DIAGNOSIS — T8450XD Infection and inflammatory reaction due to unspecified internal joint prosthesis, subsequent encounter: Secondary | ICD-10-CM

## 2019-10-14 LAB — CBC
HCT: 41.7 % (ref 35.0–45.0)
Hemoglobin: 13.6 g/dL (ref 11.7–15.5)
MCH: 27.7 pg (ref 27.0–33.0)
MCHC: 32.6 g/dL (ref 32.0–36.0)
MCV: 84.9 fL (ref 80.0–100.0)
MPV: 11.1 fL (ref 7.5–12.5)
Platelets: 208 10*3/uL (ref 140–400)
RBC: 4.91 10*6/uL (ref 3.80–5.10)
RDW: 14.8 % (ref 11.0–15.0)
WBC: 7.6 10*3/uL (ref 3.8–10.8)

## 2019-10-14 LAB — COMPREHENSIVE METABOLIC PANEL
AG Ratio: 1.4 (calc) (ref 1.0–2.5)
ALT: 6 U/L (ref 6–29)
AST: 13 U/L (ref 10–35)
Albumin: 4 g/dL (ref 3.6–5.1)
Alkaline phosphatase (APISO): 65 U/L (ref 37–153)
BUN/Creatinine Ratio: 29 (calc) — ABNORMAL HIGH (ref 6–22)
BUN: 34 mg/dL — ABNORMAL HIGH (ref 7–25)
CO2: 31 mmol/L (ref 20–32)
Calcium: 10.8 mg/dL — ABNORMAL HIGH (ref 8.6–10.4)
Chloride: 99 mmol/L (ref 98–110)
Creat: 1.16 mg/dL — ABNORMAL HIGH (ref 0.60–0.88)
Globulin: 2.9 g/dL (calc) (ref 1.9–3.7)
Glucose, Bld: 129 mg/dL — ABNORMAL HIGH (ref 65–99)
Potassium: 3.8 mmol/L (ref 3.5–5.3)
Sodium: 137 mmol/L (ref 135–146)
Total Bilirubin: 0.6 mg/dL (ref 0.2–1.2)
Total Protein: 6.9 g/dL (ref 6.1–8.1)

## 2019-10-14 LAB — C-REACTIVE PROTEIN: CRP: 0.9 mg/L (ref <8.0)

## 2019-10-14 LAB — SEDIMENTATION RATE: Sed Rate: 19 mm/h (ref 0–30)

## 2019-10-18 DIAGNOSIS — H43813 Vitreous degeneration, bilateral: Secondary | ICD-10-CM | POA: Diagnosis not present

## 2019-10-25 DIAGNOSIS — D3502 Benign neoplasm of left adrenal gland: Secondary | ICD-10-CM | POA: Diagnosis not present

## 2019-10-25 DIAGNOSIS — Z6827 Body mass index (BMI) 27.0-27.9, adult: Secondary | ICD-10-CM | POA: Diagnosis not present

## 2019-10-25 DIAGNOSIS — I10 Essential (primary) hypertension: Secondary | ICD-10-CM | POA: Diagnosis not present

## 2019-10-25 DIAGNOSIS — E119 Type 2 diabetes mellitus without complications: Secondary | ICD-10-CM | POA: Diagnosis not present

## 2019-10-26 DIAGNOSIS — N950 Postmenopausal bleeding: Secondary | ICD-10-CM | POA: Diagnosis not present

## 2019-11-03 DIAGNOSIS — R768 Other specified abnormal immunological findings in serum: Secondary | ICD-10-CM | POA: Diagnosis not present

## 2019-11-03 DIAGNOSIS — M150 Primary generalized (osteo)arthritis: Secondary | ICD-10-CM | POA: Diagnosis not present

## 2019-11-03 DIAGNOSIS — Z6825 Body mass index (BMI) 25.0-25.9, adult: Secondary | ICD-10-CM | POA: Diagnosis not present

## 2019-11-03 DIAGNOSIS — M2550 Pain in unspecified joint: Secondary | ICD-10-CM | POA: Diagnosis not present

## 2019-11-03 DIAGNOSIS — E663 Overweight: Secondary | ICD-10-CM | POA: Diagnosis not present

## 2019-11-03 DIAGNOSIS — E790 Hyperuricemia without signs of inflammatory arthritis and tophaceous disease: Secondary | ICD-10-CM | POA: Diagnosis not present

## 2019-11-08 ENCOUNTER — Ambulatory Visit (INDEPENDENT_AMBULATORY_CARE_PROVIDER_SITE_OTHER): Payer: Medicare PPO | Admitting: Infectious Diseases

## 2019-11-08 ENCOUNTER — Other Ambulatory Visit: Payer: Self-pay

## 2019-11-08 ENCOUNTER — Encounter: Payer: Self-pay | Admitting: Infectious Diseases

## 2019-11-08 DIAGNOSIS — Z20822 Contact with and (suspected) exposure to covid-19: Secondary | ICD-10-CM | POA: Diagnosis not present

## 2019-11-08 DIAGNOSIS — T8450XD Infection and inflammatory reaction due to unspecified internal joint prosthesis, subsequent encounter: Secondary | ICD-10-CM

## 2019-11-08 NOTE — Progress Notes (Signed)
   Subjective:    Patient ID: Tina Frank, female    DOB: 04/17/1940, 79 y.o.   MRN: [id number]  HPI 79 y.o.femalewith hx of HTN, GERD, HLD, OA, hx of left shoulder replacement in 2016. She has had pain with since 2019 in setting of elevated inflammatory markers, and CT imaging suggesting loosening of hardware. She had culture negative aspirate cx in Oct 2019. She chose to do chronic oral suppression with cephalexin instead of surgery. MRI this past fall shows 12 x 8.5 x 7.3cm of largely cystic lesion communicating to joint. She underwent repeat aspiration was again NGTD. She underwent resection 02-09-19 and was d/c home with vanco/ceftriaxone. Her Cx ultimately grew P acnes. She was changed to continuous infusion Penicillin on 02-24-19. She was changed to keflex to follow this.  Marland Kitchen 04-03-2110-2812-28-5-21 [id number] 0.9 ESR48 [id number] 19  She was on keflex $RemoveB'750mg'ovgagvCZ$  bid until 07-2019. Since she has had nasal congestion.   Has been worked up by rheum for rheumatoid/osteoarthritis. Has been on meloxicam since.  Has had L THR.   Shoulder is doing ok- No erythema or swelling. Occas pain.  Now has pain in her R shoulder as well.   Has had chills/sweats which prompted her to come to clinic for f/u visit.   She was exposed to 43 yo grand-daughter 10-29-19 . Her granddaughter has since turned positive (results on 8-30).  She has had vaccination x 2.   Review of Systems  Constitutional: Negative for appetite change, chills, fever and unexpected weight change.  Gastrointestinal: Negative for diarrhea and nausea.  Genitourinary: Negative for difficulty urinating.  Musculoskeletal: Positive for arthralgias.  had recent d&c Please see HPI. All other systems reviewed and negative.      Objective:   Physical Exam Vitals reviewed.  Constitutional:      Appearance: Normal appearance. She is not ill-appearing or  diaphoretic.  HENT:     Mouth/Throat:     Mouth: Mucous membranes are moist.     Pharynx: No oropharyngeal exudate.  Eyes:     Extraocular Movements: Extraocular movements intact.     Pupils: Pupils are equal, round, and reactive to light.  Cardiovascular:     Rate and Rhythm: Normal rate and regular rhythm.  Pulmonary:     Effort: Pulmonary effort is normal.     Breath sounds: Normal breath sounds.  Abdominal:     General: Bowel sounds are normal. There is no distension.     Palpations: Abdomen is soft.     Tenderness: There is no abdominal tenderness.  Musculoskeletal:       Arms:     Cervical back: Normal range of motion and neck supple.     Right lower leg: No edema.     Left lower leg: No edema.  Neurological:     General: No focal deficit present.     Mental Status: She is alert and oriented to person, place, and time.  Psychiatric:        Mood and Affect: Mood normal.           Assessment & Plan:

## 2019-11-08 NOTE — Assessment & Plan Note (Signed)
She appears to be doing well- no sx, pain and swelling free. No heat.  Her inflammatory markers are actually better than when she was on anbx.

## 2019-11-08 NOTE — Assessment & Plan Note (Signed)
Will refer her for testing.  We discussed that her exposure was 1 week prior to her contact turning positive. Current variant usually have sx 3-5 days after becoming infected.  Given that she was asx and very early in course, exposure seems less likely.  Neverthless, will send her for testing.  Defer booster at this point. She may be a mAb candidate.

## 2019-11-11 DIAGNOSIS — Z20822 Contact with and (suspected) exposure to covid-19: Secondary | ICD-10-CM | POA: Diagnosis not present

## 2019-11-15 ENCOUNTER — Other Ambulatory Visit: Payer: Self-pay

## 2019-11-15 ENCOUNTER — Ambulatory Visit
Admission: RE | Admit: 2019-11-15 | Discharge: 2019-11-15 | Disposition: A | Discharge status: Home / Self Care | Payer: Medicare PPO | Source: Ambulatory Visit | Attending: Obstetrics and Gynecology | Admitting: Obstetrics and Gynecology

## 2019-11-15 DIAGNOSIS — Z1231 Encounter for screening mammogram for malignant neoplasm of breast: Secondary | ICD-10-CM

## 2019-11-18 DIAGNOSIS — J029 Acute pharyngitis, unspecified: Secondary | ICD-10-CM | POA: Diagnosis not present

## 2019-11-18 DIAGNOSIS — R6889 Other general symptoms and signs: Secondary | ICD-10-CM | POA: Diagnosis not present

## 2019-11-19 DIAGNOSIS — J029 Acute pharyngitis, unspecified: Secondary | ICD-10-CM | POA: Diagnosis not present

## 2019-11-30 ENCOUNTER — Ambulatory Visit: Payer: Medicare PPO | Admitting: Podiatry

## 2019-11-30 ENCOUNTER — Other Ambulatory Visit: Payer: Self-pay

## 2019-11-30 ENCOUNTER — Encounter: Payer: Self-pay | Admitting: Podiatry

## 2019-11-30 DIAGNOSIS — M79676 Pain in unspecified toe(s): Secondary | ICD-10-CM

## 2019-11-30 DIAGNOSIS — B351 Tinea unguium: Secondary | ICD-10-CM

## 2019-11-30 DIAGNOSIS — E119 Type 2 diabetes mellitus without complications: Secondary | ICD-10-CM | POA: Diagnosis not present

## 2019-11-30 NOTE — Progress Notes (Signed)
This patient returns to my office for at risk foot care.  This patient requires this care by a professional since this patient will be at risk due to having diabetes type 2.  This patient is unable to cut nails herself since the patient cannot reach her nails.These nails are painful walking and wearing shoes.  This patient presents for at risk foot care today.  General Appearance  Alert, conversant and in no acute stress.  Vascular  Dorsalis pedis and posterior tibial  pulses are palpable  bilaterally.  Capillary return is within normal limits  bilaterally. Temperature is within normal limits  bilaterally.  Neurologic  Senn-Weinstein monofilament wire test within normal limits  bilaterally. Muscle power within normal limits bilaterally.  Nails Thick disfigured discolored nails with subungual debris  from hallux to fifth toes bilaterally. No evidence of bacterial infection or drainage bilaterally.  Orthopedic  No limitations of motion  feet .  No crepitus or effusions noted.  No bony pathology or digital deformities noted.  Skin  normotropic skin with no porokeratosis noted bilaterally.  No signs of infections or ulcers noted.     Onychomycosis  Pain in right toes  Pain in left toes  Consent was obtained for treatment procedures.   Mechanical debridement of nails 1-5  bilaterally performed with a nail nipper.  Filed with dremel without incident.    Return office visit      9 weeks               Told patient to return for periodic foot care and evaluation due to potential at risk complications.   Shambhavi Salley DPM  

## 2019-12-19 DIAGNOSIS — M2550 Pain in unspecified joint: Secondary | ICD-10-CM | POA: Diagnosis not present

## 2019-12-19 DIAGNOSIS — E790 Hyperuricemia without signs of inflammatory arthritis and tophaceous disease: Secondary | ICD-10-CM | POA: Diagnosis not present

## 2019-12-19 DIAGNOSIS — R768 Other specified abnormal immunological findings in serum: Secondary | ICD-10-CM | POA: Diagnosis not present

## 2019-12-19 DIAGNOSIS — E663 Overweight: Secondary | ICD-10-CM | POA: Diagnosis not present

## 2019-12-19 DIAGNOSIS — Z6826 Body mass index (BMI) 26.0-26.9, adult: Secondary | ICD-10-CM | POA: Diagnosis not present

## 2019-12-19 DIAGNOSIS — M150 Primary generalized (osteo)arthritis: Secondary | ICD-10-CM | POA: Diagnosis not present

## 2019-12-23 DIAGNOSIS — Z23 Encounter for immunization: Secondary | ICD-10-CM | POA: Diagnosis not present

## 2020-01-16 ENCOUNTER — Other Ambulatory Visit: Payer: Self-pay

## 2020-01-16 ENCOUNTER — Ambulatory Visit
Admission: RE | Admit: 2020-01-16 | Discharge: 2020-01-16 | Disposition: A | Discharge status: Home / Self Care | Payer: Medicare PPO | Source: Ambulatory Visit | Attending: Obstetrics and Gynecology | Admitting: Obstetrics and Gynecology

## 2020-01-16 DIAGNOSIS — Z1231 Encounter for screening mammogram for malignant neoplasm of breast: Secondary | ICD-10-CM | POA: Diagnosis not present

## 2020-01-23 DIAGNOSIS — Z6824 Body mass index (BMI) 24.0-24.9, adult: Secondary | ICD-10-CM | POA: Diagnosis not present

## 2020-01-23 DIAGNOSIS — H6122 Impacted cerumen, left ear: Secondary | ICD-10-CM | POA: Diagnosis not present

## 2020-01-23 DIAGNOSIS — M25511 Pain in right shoulder: Secondary | ICD-10-CM | POA: Diagnosis not present

## 2020-01-23 DIAGNOSIS — M25512 Pain in left shoulder: Secondary | ICD-10-CM | POA: Diagnosis not present

## 2020-01-23 DIAGNOSIS — E559 Vitamin D deficiency, unspecified: Secondary | ICD-10-CM | POA: Diagnosis not present

## 2020-01-23 DIAGNOSIS — R7303 Prediabetes: Secondary | ICD-10-CM | POA: Diagnosis not present

## 2020-01-23 DIAGNOSIS — E7800 Pure hypercholesterolemia, unspecified: Secondary | ICD-10-CM | POA: Diagnosis not present

## 2020-02-10 DIAGNOSIS — M25511 Pain in right shoulder: Secondary | ICD-10-CM | POA: Diagnosis not present

## 2020-02-10 DIAGNOSIS — M25512 Pain in left shoulder: Secondary | ICD-10-CM | POA: Diagnosis not present

## 2020-02-15 DIAGNOSIS — Z0000 Encounter for general adult medical examination without abnormal findings: Secondary | ICD-10-CM | POA: Diagnosis not present

## 2020-02-15 DIAGNOSIS — M810 Age-related osteoporosis without current pathological fracture: Secondary | ICD-10-CM | POA: Diagnosis not present

## 2020-02-15 DIAGNOSIS — Z1389 Encounter for screening for other disorder: Secondary | ICD-10-CM | POA: Diagnosis not present

## 2020-02-17 ENCOUNTER — Other Ambulatory Visit: Payer: Self-pay | Admitting: Orthopaedic Surgery

## 2020-02-17 DIAGNOSIS — M25511 Pain in right shoulder: Secondary | ICD-10-CM

## 2020-02-22 ENCOUNTER — Ambulatory Visit: Payer: Medicare PPO | Admitting: Podiatry

## 2020-02-22 ENCOUNTER — Encounter: Payer: Self-pay | Admitting: Podiatry

## 2020-02-22 ENCOUNTER — Other Ambulatory Visit: Payer: Self-pay

## 2020-02-22 DIAGNOSIS — B351 Tinea unguium: Secondary | ICD-10-CM

## 2020-02-22 DIAGNOSIS — M79676 Pain in unspecified toe(s): Secondary | ICD-10-CM

## 2020-02-22 DIAGNOSIS — E119 Type 2 diabetes mellitus without complications: Secondary | ICD-10-CM | POA: Diagnosis not present

## 2020-02-22 NOTE — Progress Notes (Signed)
This patient returns to my office for at risk foot care.  This patient requires this care by a professional since this patient will be at risk due to having diabetes type 2.  This patient is unable to cut nails herself since the patient cannot reach her nails.These nails are painful walking and wearing shoes.  This patient presents for at risk foot care today.  General Appearance  Alert, conversant and in no acute stress.  Vascular  Dorsalis pedis and posterior tibial  pulses are palpable  bilaterally.  Capillary return is within normal limits  bilaterally. Temperature is within normal limits  bilaterally.  Neurologic  Senn-Weinstein monofilament wire test within normal limits  bilaterally. Muscle power within normal limits bilaterally.  Nails Thick disfigured discolored nails with subungual debris  from hallux to fifth toes bilaterally. No evidence of bacterial infection or drainage bilaterally.  Orthopedic  No limitations of motion  feet .  No crepitus or effusions noted.  No bony pathology or digital deformities noted.  Skin  normotropic skin with no porokeratosis noted bilaterally.  No signs of infections or ulcers noted.     Onychomycosis  Pain in right toes  Pain in left toes  Consent was obtained for treatment procedures.   Mechanical debridement of nails 1-5  bilaterally performed with a nail nipper.  Filed with dremel without incident.    Return office visit      9 weeks               Told patient to return for periodic foot care and evaluation due to potential at risk complications.   Gardiner Barefoot DPM

## 2020-02-23 DIAGNOSIS — K2100 Gastro-esophageal reflux disease with esophagitis, without bleeding: Secondary | ICD-10-CM | POA: Diagnosis not present

## 2020-02-24 DIAGNOSIS — M25511 Pain in right shoulder: Secondary | ICD-10-CM | POA: Diagnosis not present

## 2020-03-14 ENCOUNTER — Ambulatory Visit
Admission: RE | Admit: 2020-03-14 | Discharge: 2020-03-14 | Disposition: A | Discharge status: Home / Self Care | Payer: Medicare PPO | Source: Ambulatory Visit | Attending: Orthopaedic Surgery | Admitting: Orthopaedic Surgery

## 2020-03-14 ENCOUNTER — Other Ambulatory Visit: Payer: Self-pay

## 2020-03-14 DIAGNOSIS — M25711 Osteophyte, right shoulder: Secondary | ICD-10-CM | POA: Diagnosis not present

## 2020-03-14 DIAGNOSIS — M19011 Primary osteoarthritis, right shoulder: Secondary | ICD-10-CM | POA: Diagnosis not present

## 2020-03-14 DIAGNOSIS — M25511 Pain in right shoulder: Secondary | ICD-10-CM

## 2020-03-14 DIAGNOSIS — I700 Atherosclerosis of aorta: Secondary | ICD-10-CM | POA: Diagnosis not present

## 2020-03-15 DIAGNOSIS — H2513 Age-related nuclear cataract, bilateral: Secondary | ICD-10-CM | POA: Diagnosis not present

## 2020-03-15 DIAGNOSIS — H5202 Hypermetropia, left eye: Secondary | ICD-10-CM | POA: Diagnosis not present

## 2020-03-16 DIAGNOSIS — M25511 Pain in right shoulder: Secondary | ICD-10-CM | POA: Diagnosis not present

## 2020-03-20 DIAGNOSIS — Z6824 Body mass index (BMI) 24.0-24.9, adult: Secondary | ICD-10-CM | POA: Diagnosis not present

## 2020-03-20 DIAGNOSIS — I10 Essential (primary) hypertension: Secondary | ICD-10-CM | POA: Diagnosis not present

## 2020-03-22 DIAGNOSIS — M6281 Muscle weakness (generalized): Secondary | ICD-10-CM | POA: Diagnosis not present

## 2020-03-22 DIAGNOSIS — M19011 Primary osteoarthritis, right shoulder: Secondary | ICD-10-CM | POA: Diagnosis not present

## 2020-03-22 DIAGNOSIS — M25511 Pain in right shoulder: Secondary | ICD-10-CM | POA: Diagnosis not present

## 2020-03-22 DIAGNOSIS — M25611 Stiffness of right shoulder, not elsewhere classified: Secondary | ICD-10-CM | POA: Diagnosis not present

## 2020-04-03 ENCOUNTER — Other Ambulatory Visit: Payer: Self-pay

## 2020-04-03 ENCOUNTER — Ambulatory Visit: Payer: Medicare PPO | Admitting: Physician Assistant

## 2020-04-03 ENCOUNTER — Encounter: Payer: Self-pay | Admitting: Physician Assistant

## 2020-04-03 DIAGNOSIS — D1800 Hemangioma unspecified site: Secondary | ICD-10-CM

## 2020-04-03 DIAGNOSIS — B353 Tinea pedis: Secondary | ICD-10-CM

## 2020-04-03 DIAGNOSIS — L821 Other seborrheic keratosis: Secondary | ICD-10-CM | POA: Diagnosis not present

## 2020-04-03 MED ORDER — KETOCONAZOLE 2 % EX CREA: 1.0000 "application " | TOPICAL_CREAM | Freq: Every day | CUTANEOUS | 8 refills | Status: AC

## 2020-04-03 NOTE — Progress Notes (Signed)
   Follow-Up Visit   Subjective  Tina Frank is a 80 y.o. female who presents for the following: Rash (Both feet back of heal, around ankle making its way to my toes-  no itch- more like a burning sensation tx- Cetaphil lotion).   The following portions of the chart were reviewed this encounter and updated as appropriate:  Tobacco  Allergies  Meds  Problems  Med Hx  Surg Hx  Fam Hx      Objective  Well appearing patient in no apparent distress; mood and affect are within normal limits.  All skin waist up examined.  Objective  Mid Back, Right Lower Back: Stuck-on, waxy, tan-brown papules and plaques. --Discussed benign etiology and prognosis.   Objective  scattered total body: Multiple red papules  Objective  both feet: Scaling and maceration between toes and over distal and lateral soles. Moccasin type presentation. All toenails thick and yellow most consistent with onymycosis.  Assessment & Plan  Seborrheic keratosis (2) Right Lower Back; Mid Back  Okay to leave if stable  Hemangioma, unspecified site scattered total body  Yearly skin check  Tinea pedis of both feet both feet  Also get over the counter antifungal spray for all shoes. Apply topical cream to skin and toenails. Continue to treat toenails even when feet clear to decrease fungal growth spread.  ketoconazole (NIZORAL) 2 % cream - both feet   I, Lashai Grosch, PA-C, have reviewed all documentation's for this visit.  The documentation on 04/03/20 for the exam, diagnosis, procedures and orders are all accurate and complete.

## 2020-04-11 DIAGNOSIS — M6281 Muscle weakness (generalized): Secondary | ICD-10-CM | POA: Diagnosis not present

## 2020-04-11 DIAGNOSIS — M19011 Primary osteoarthritis, right shoulder: Secondary | ICD-10-CM | POA: Diagnosis not present

## 2020-04-11 DIAGNOSIS — M25611 Stiffness of right shoulder, not elsewhere classified: Secondary | ICD-10-CM | POA: Diagnosis not present

## 2020-04-11 DIAGNOSIS — M25511 Pain in right shoulder: Secondary | ICD-10-CM | POA: Diagnosis not present

## 2020-04-13 DIAGNOSIS — M19011 Primary osteoarthritis, right shoulder: Secondary | ICD-10-CM | POA: Diagnosis not present

## 2020-04-20 DIAGNOSIS — M2550 Pain in unspecified joint: Secondary | ICD-10-CM | POA: Diagnosis not present

## 2020-04-20 DIAGNOSIS — M19011 Primary osteoarthritis, right shoulder: Secondary | ICD-10-CM | POA: Diagnosis not present

## 2020-04-20 DIAGNOSIS — E790 Hyperuricemia without signs of inflammatory arthritis and tophaceous disease: Secondary | ICD-10-CM | POA: Diagnosis not present

## 2020-04-20 DIAGNOSIS — M150 Primary generalized (osteo)arthritis: Secondary | ICD-10-CM | POA: Diagnosis not present

## 2020-04-20 DIAGNOSIS — Z6824 Body mass index (BMI) 24.0-24.9, adult: Secondary | ICD-10-CM | POA: Diagnosis not present

## 2020-04-20 DIAGNOSIS — R768 Other specified abnormal immunological findings in serum: Secondary | ICD-10-CM | POA: Diagnosis not present

## 2020-04-23 DIAGNOSIS — Z6827 Body mass index (BMI) 27.0-27.9, adult: Secondary | ICD-10-CM | POA: Diagnosis not present

## 2020-04-23 DIAGNOSIS — D3502 Benign neoplasm of left adrenal gland: Secondary | ICD-10-CM | POA: Diagnosis not present

## 2020-04-23 DIAGNOSIS — E119 Type 2 diabetes mellitus without complications: Secondary | ICD-10-CM | POA: Diagnosis not present

## 2020-04-23 DIAGNOSIS — I10 Essential (primary) hypertension: Secondary | ICD-10-CM | POA: Diagnosis not present

## 2020-04-24 DIAGNOSIS — M6281 Muscle weakness (generalized): Secondary | ICD-10-CM | POA: Diagnosis not present

## 2020-04-24 DIAGNOSIS — M25611 Stiffness of right shoulder, not elsewhere classified: Secondary | ICD-10-CM | POA: Diagnosis not present

## 2020-04-24 DIAGNOSIS — M25511 Pain in right shoulder: Secondary | ICD-10-CM | POA: Diagnosis not present

## 2020-04-24 DIAGNOSIS — M19011 Primary osteoarthritis, right shoulder: Secondary | ICD-10-CM | POA: Diagnosis not present

## 2020-04-25 ENCOUNTER — Encounter: Payer: Self-pay | Admitting: Podiatry

## 2020-04-25 ENCOUNTER — Ambulatory Visit: Payer: Medicare PPO | Admitting: Podiatry

## 2020-04-25 ENCOUNTER — Other Ambulatory Visit: Payer: Self-pay

## 2020-04-25 DIAGNOSIS — B351 Tinea unguium: Secondary | ICD-10-CM

## 2020-04-25 DIAGNOSIS — M79676 Pain in unspecified toe(s): Secondary | ICD-10-CM | POA: Diagnosis not present

## 2020-04-25 DIAGNOSIS — M129 Arthropathy, unspecified: Secondary | ICD-10-CM | POA: Diagnosis not present

## 2020-04-25 DIAGNOSIS — E119 Type 2 diabetes mellitus without complications: Secondary | ICD-10-CM

## 2020-04-25 NOTE — Progress Notes (Signed)
This patient returns to my office for at risk foot care.  This patient requires this care by a professional since this patient will be at risk due to having diabetes type 2.  This patient is unable to cut nails herself since the patient cannot reach her nails.These nails are painful walking and wearing shoes.  This patient presents for at risk foot care today.  Patient says she is having pain and numbness on the top of both feet.  She says she is a walker.  General Appearance  Alert, conversant and in no acute stress.  Vascular  Dorsalis pedis and posterior tibial  pulses are palpable  bilaterally.  Capillary return is within normal limits  bilaterally. Temperature is within normal limits  bilaterally.  Neurologic  Senn-Weinstein monofilament wire test within normal limits  bilaterally. Muscle power within normal limits bilaterally.  Nails Thick disfigured discolored nails with subungual debris  from hallux to fifth toes bilaterally. No evidence of bacterial infection or drainage bilaterally.  Orthopedic  No limitations of motion  feet .  No crepitus or effusions noted.  No bony pathology or digital deformities noted.  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  Skin  normotropic skin with no porokeratosis noted bilaterally.  No signs of infections or ulcers noted.     Onychomycosis  Pain in right toes  Pain in left toes  Chronic arthrits  Midfoot  B/L.  Consent was obtained for treatment procedures.   Mechanical debridement of nails 1-5  bilaterally performed with a nail nipper.  Filed with dremel without incident.  Recommend powersteps.  Discussed nerve pressure at site of midfoot arthritis and relaced her shoe for her and she felt immediate relief.   Return office visit      9 weeks               Told patient to return for periodic foot care and evaluation due to potential at risk complications.   Gardiner Barefoot DPM

## 2020-04-27 DIAGNOSIS — M19011 Primary osteoarthritis, right shoulder: Secondary | ICD-10-CM | POA: Diagnosis not present

## 2020-05-02 DIAGNOSIS — M6281 Muscle weakness (generalized): Secondary | ICD-10-CM | POA: Diagnosis not present

## 2020-05-02 DIAGNOSIS — M19011 Primary osteoarthritis, right shoulder: Secondary | ICD-10-CM | POA: Diagnosis not present

## 2020-05-02 DIAGNOSIS — M25611 Stiffness of right shoulder, not elsewhere classified: Secondary | ICD-10-CM | POA: Diagnosis not present

## 2020-05-02 DIAGNOSIS — M25511 Pain in right shoulder: Secondary | ICD-10-CM | POA: Diagnosis not present

## 2020-05-09 DIAGNOSIS — H2511 Age-related nuclear cataract, right eye: Secondary | ICD-10-CM | POA: Diagnosis not present

## 2020-05-09 DIAGNOSIS — H25811 Combined forms of age-related cataract, right eye: Secondary | ICD-10-CM | POA: Diagnosis not present

## 2020-06-15 DIAGNOSIS — M19012 Primary osteoarthritis, left shoulder: Secondary | ICD-10-CM | POA: Diagnosis not present

## 2020-06-21 ENCOUNTER — Emergency Department (HOSPITAL_COMMUNITY): Payer: Medicare PPO

## 2020-06-21 ENCOUNTER — Other Ambulatory Visit: Payer: Self-pay

## 2020-06-21 ENCOUNTER — Emergency Department (HOSPITAL_COMMUNITY)
Admission: EM | Admit: 2020-06-21 | Discharge: 2020-06-21 | Disposition: A | Discharge status: Home / Self Care | Payer: Medicare PPO | Attending: Emergency Medicine | Admitting: Emergency Medicine

## 2020-06-21 ENCOUNTER — Encounter (HOSPITAL_COMMUNITY): Payer: Self-pay

## 2020-06-21 DIAGNOSIS — I129 Hypertensive chronic kidney disease with stage 1 through stage 4 chronic kidney disease, or unspecified chronic kidney disease: Secondary | ICD-10-CM | POA: Insufficient documentation

## 2020-06-21 DIAGNOSIS — Z79899 Other long term (current) drug therapy: Secondary | ICD-10-CM | POA: Diagnosis not present

## 2020-06-21 DIAGNOSIS — N182 Chronic kidney disease, stage 2 (mild): Secondary | ICD-10-CM | POA: Insufficient documentation

## 2020-06-21 DIAGNOSIS — J690 Pneumonitis due to inhalation of food and vomit: Secondary | ICD-10-CM | POA: Insufficient documentation

## 2020-06-21 DIAGNOSIS — R069 Unspecified abnormalities of breathing: Secondary | ICD-10-CM | POA: Diagnosis not present

## 2020-06-21 DIAGNOSIS — Z96642 Presence of left artificial hip joint: Secondary | ICD-10-CM | POA: Diagnosis not present

## 2020-06-21 DIAGNOSIS — N281 Cyst of kidney, acquired: Secondary | ICD-10-CM | POA: Diagnosis not present

## 2020-06-21 DIAGNOSIS — E119 Type 2 diabetes mellitus without complications: Secondary | ICD-10-CM | POA: Diagnosis not present

## 2020-06-21 DIAGNOSIS — Z96612 Presence of left artificial shoulder joint: Secondary | ICD-10-CM | POA: Insufficient documentation

## 2020-06-21 DIAGNOSIS — K828 Other specified diseases of gallbladder: Secondary | ICD-10-CM | POA: Diagnosis not present

## 2020-06-21 DIAGNOSIS — R112 Nausea with vomiting, unspecified: Secondary | ICD-10-CM | POA: Diagnosis not present

## 2020-06-21 DIAGNOSIS — R0602 Shortness of breath: Secondary | ICD-10-CM | POA: Diagnosis not present

## 2020-06-21 DIAGNOSIS — R059 Cough, unspecified: Secondary | ICD-10-CM | POA: Diagnosis not present

## 2020-06-21 DIAGNOSIS — R457 State of emotional shock and stress, unspecified: Secondary | ICD-10-CM | POA: Diagnosis not present

## 2020-06-21 DIAGNOSIS — Z87891 Personal history of nicotine dependence: Secondary | ICD-10-CM | POA: Diagnosis not present

## 2020-06-21 DIAGNOSIS — R1111 Vomiting without nausea: Secondary | ICD-10-CM | POA: Diagnosis not present

## 2020-06-21 DIAGNOSIS — Z7982 Long term (current) use of aspirin: Secondary | ICD-10-CM | POA: Insufficient documentation

## 2020-06-21 LAB — COMPREHENSIVE METABOLIC PANEL
ALT: 11 U/L (ref 0–44)
AST: 17 U/L (ref 15–41)
Albumin: 3.7 g/dL (ref 3.5–5.0)
Alkaline Phosphatase: 49 U/L (ref 38–126)
Anion gap: 11 (ref 5–15)
BUN: 26 mg/dL — ABNORMAL HIGH (ref 8–23)
CO2: 22 mmol/L (ref 22–32)
Calcium: 9.8 mg/dL (ref 8.9–10.3)
Chloride: 110 mmol/L (ref 98–111)
Creatinine, Ser: 1.06 mg/dL — ABNORMAL HIGH (ref 0.44–1.00)
GFR, Estimated: 53 mL/min — ABNORMAL LOW (ref >60)
Glucose, Bld: 113 mg/dL — ABNORMAL HIGH (ref 70–99)
Potassium: 3.3 mmol/L — ABNORMAL LOW (ref 3.5–5.1)
Sodium: 143 mmol/L (ref 135–145)
Total Bilirubin: 0.8 mg/dL (ref 0.3–1.2)
Total Protein: 6.9 g/dL (ref 6.5–8.1)

## 2020-06-21 LAB — CBC WITH DIFFERENTIAL/PLATELET
Abs Immature Granulocytes: 0.03 10*3/uL (ref 0.00–0.07)
Basophils Absolute: 0 10*3/uL (ref 0.0–0.1)
Basophils Relative: 0 %
Eosinophils Absolute: 0.2 10*3/uL (ref 0.0–0.5)
Eosinophils Relative: 2 %
HCT: 39.8 % (ref 36.0–46.0)
Hemoglobin: 13.4 g/dL (ref 12.0–15.0)
Immature Granulocytes: 0 %
Lymphocytes Relative: 12 %
Lymphs Abs: 1.1 10*3/uL (ref 0.7–4.0)
MCH: 29.9 pg (ref 26.0–34.0)
MCHC: 33.7 g/dL (ref 30.0–36.0)
MCV: 88.8 fL (ref 80.0–100.0)
Monocytes Absolute: 0.9 10*3/uL (ref 0.1–1.0)
Monocytes Relative: 9 %
Neutro Abs: 7.1 10*3/uL (ref 1.7–7.7)
Neutrophils Relative %: 77 %
Platelets: 185 10*3/uL (ref 150–400)
RBC: 4.48 MIL/uL (ref 3.87–5.11)
RDW: 13.7 % (ref 11.5–15.5)
WBC: 9.2 10*3/uL (ref 4.0–10.5)
nRBC: 0 % (ref 0.0–0.2)

## 2020-06-21 LAB — TROPONIN I (HIGH SENSITIVITY)
Troponin I (High Sensitivity): 11 ng/L (ref <18)
Troponin I (High Sensitivity): 14 ng/L (ref <18)

## 2020-06-21 LAB — LIPASE, BLOOD: Lipase: 39 U/L (ref 11–51)

## 2020-06-21 MED ORDER — ONDANSETRON HCL 4 MG/2ML IJ SOLN
4.0000 mg | Freq: Once | INTRAMUSCULAR | Status: AC
  Administered 2020-06-21: 4 mg via INTRAVENOUS
  Filled 2020-06-21: qty 2

## 2020-06-21 MED ORDER — ONDANSETRON 4 MG PO TBDP: ORAL_TABLET | ORAL | 0 refills | Status: DC

## 2020-06-21 MED ORDER — IOHEXOL 350 MG/ML SOLN
75.0000 mL | Freq: Once | INTRAVENOUS | Status: AC | PRN
  Administered 2020-06-21: 75 mL via INTRAVENOUS

## 2020-06-21 MED ORDER — FAMOTIDINE IN NACL 20-0.9 MG/50ML-% IV SOLN
20.0000 mg | Freq: Once | INTRAVENOUS | Status: AC
  Administered 2020-06-21: 20 mg via INTRAVENOUS
  Filled 2020-06-21: qty 50

## 2020-06-21 MED ORDER — SODIUM CHLORIDE 0.9 % IV BOLUS
1000.0000 mL | Freq: Once | INTRAVENOUS | Status: AC
  Administered 2020-06-21: 1000 mL via INTRAVENOUS

## 2020-06-21 MED ORDER — ALUM & MAG HYDROXIDE-SIMETH 200-200-20 MG/5ML PO SUSP
30.0000 mL | Freq: Once | ORAL | Status: AC
  Administered 2020-06-21: 30 mL via ORAL
  Filled 2020-06-21: qty 30

## 2020-06-21 MED ORDER — LIDOCAINE VISCOUS HCL 2 % MT SOLN
15.0000 mL | Freq: Once | OROMUCOSAL | Status: AC
  Administered 2020-06-21: 15 mL via ORAL
  Filled 2020-06-21: qty 15

## 2020-06-21 MED ORDER — PANTOPRAZOLE SODIUM 40 MG IV SOLR
40.0000 mg | Freq: Once | INTRAVENOUS | Status: AC
  Administered 2020-06-21: 40 mg via INTRAVENOUS
  Filled 2020-06-21: qty 40

## 2020-06-21 MED ORDER — SUCRALFATE 1 G PO TABS: 1.0000 g | ORAL_TABLET | Freq: Three times a day (TID) | ORAL | 0 refills | Status: DC

## 2020-06-21 NOTE — ED Provider Notes (Signed)
[facility] DEPT Provider Note   CSN: [id number] Arrival date & time: 06/21/20  1702     History Chief Complaint  Patient presents with  . Cough  . Shortness of Breath    Tina Frank is a 80 y.o. female history of CKD, diabetes, hypertension, hyperlipidemia, who presented with cough.  Patient states that she was coughing up some whitish phlegm today.  She states that she she usually has reflux she was not sure if it was just her reflux.  She is on medicines for reflux.  She states that she still feels some phlegm in her chest.  She has no fevers.  She denies any Covid exposure.  She states that she is fully vaccinated against COVID and had her booster as well.   The history is provided by the patient.       Past Medical History:  Diagnosis Date  . CKD (chronic kidney disease), stage II    nephrologist-- dr Moshe Cipro  [facility])  . Diabetes mellitus type 2, diet-controlled ([place])    followed by pcp---   (09-20-2019  per pt does not check blood sugar at home)  . GERD (gastroesophageal reflux disease)   . History of primary hyperparathyroidism    s/p  parathyroidectomy right inferior on 12-02-2012  . Hyperlipidemia   . Hypertension    followd by nephrologist----  (09-20-2019  per pt had stress test done some time ago , unsure when/ where, thinks told ok)  . OA (osteoarthritis)   . Osteoporosis   . Prosthetic joint infection ([place])    followed by dr j. hatcher ([place])   left total shoulder arthroplasty 12/ 2016  ; 12/ 2020  revision with explant joint replacement and hemiarthroplasty;  completed 6 month antibiotic 05/ 2021    Patient Active Problem List   Diagnosis Date Noted  . Chronic arthropathy 04/25/2020  . Close exposure to COVID-19 virus 11/08/2019  . Decubitus skin ulcer 04/12/2019  . Hypokalemia 03/15/2019  . Prosthetic joint infection ([place]) 02/09/2019  . Degenerative joint disease of left shoulder 02/14/2015  . Pre-syncope  10/06/2013  . Leukocytosis 10/06/2013  . Hypotension 10/06/2013  . Osteoarthritis 07/27/2013  . HTN (hypertension) with goal to be determined 10/28/2011  . DM (diabetes mellitus) ([place]) 10/28/2011  . Hyperlipidemia 10/28/2011  . Reflux esophagitis   . Herpes zoster   . Shingles     Past Surgical History:  Procedure Laterality Date  . COLONOSCOPY    . DILATATION & CURETTAGE/HYSTEROSCOPY WITH MYOSURE N/A 09/28/2019   Procedure: DILATATION & CURETTAGE/HYSTEROSCOPY WITH MYOSURE;  Surgeon: Brien Few, MD;  Location: [facility];  Service: Gynecology;  Laterality: N/A;  . EYE SURGERY  yrs ago   laser surgery for retinal tear.  (unilateral , pt unsure which side)  . PARATHYROIDECTOMY N/A 12/02/2012   Procedure: PARATHYROIDECTOMY with frozen section ;  Surgeon: Earnstine Regal, MD;  Location: [facility];  Service: General;  Laterality: N/A;  . SHOULDER INJECTION Left 07/27/2013   Procedure: SHOULDER INJECTION;  Surgeon: [name], MD;  Location: [facility];  Service: Orthopedics;  Laterality: Left;  . STERIOD INJECTION Right 02/14/2015   Procedure: RIGHT SHOULDER CORTISONE INJECTION;  Surgeon: [name], MD;  Location: [facility];  Service: Orthopedics;  Laterality: Right;  . TOTAL HIP ARTHROPLASTY Left 07/27/2013   Procedure: TOTAL HIP ARTHROPLASTY ANTERIOR APPROACH;  Surgeon: [name], MD;  Location: [facility];  Service: Orthopedics;  Laterality: Left;  . TOTAL SHOULDER ARTHROPLASTY  Left 02/14/2015   Procedure: LEFT TOTAL SHOULDER ARTHROPLASTY;  Surgeon: [name], MD;  Location: [facility];  Service: Orthopedics;  Laterality: Left;  . TOTAL SHOULDER REVISION Left 02/09/2019   Procedure: TOTAL SHOULDER REVISION;  Surgeon: Hiram Gash, MD;  Location: [facility];  Service: Orthopedics;  Laterality: Left;     OB History   No obstetric history on file.     Family History  Problem Relation Age of Onset  . Heart failure Mother   . Cancer Mother   . Breast cancer Mother         in her 37s  . Cancer Father     Social History   Tobacco Use  . Smoking status: Former Smoker    Packs/day: 1.00    Years: 20.00    Pack years: 20.00    Types: Cigarettes    Quit date: 10/28/1987    Years since quitting: 32.6  . Smokeless tobacco: Never Used  Vaping Use  . Vaping Use: Never used  Substance Use Topics  . Alcohol use: No  . Drug use: Never    Home Medications Prior to Admission medications   Medication Sig Start Date End Date Taking? Authorizing Provider  aspirin EC 81 MG tablet Take 81 mg by mouth daily.    [provider]  atorvastatin (LIPITOR) 10 MG tablet Take 10 mg by mouth at bedtime.     [provider]  calcium carbonate (TUMS - DOSED IN MG ELEMENTAL CALCIUM) 500 MG chewable tablet Chew 1 tablet by mouth 3 (three) times daily as needed for indigestion or heartburn.    [provider]  carvedilol (COREG) 12.5 MG tablet Take 12.5 mg by mouth 2 (two) times daily with a meal.  12/04/14   [provider]  cloNIDine (CATAPRES) 0.1 MG tablet Take 0.1 mg by mouth daily as needed (if systolic bp is over 856).     [provider]  denosumab (PROLIA) 60 MG/ML SOSY injection Inject 60 mg into the skin every 6 (six) months. 03/16/18   Campbell Riches, MD  furosemide (LASIX) 40 MG tablet Take 40 mg by mouth 2 (two) times daily.  08/15/17   [provider]  hydrALAZINE (APRESOLINE) 50 MG tablet Take 50 mg by mouth 2 (two) times daily.  12/04/14   [provider]  meloxicam (MOBIC) 15 MG tablet  11/03/19   [provider]  pantoprazole (PROTONIX) 40 MG tablet Take 40 mg by mouth See admin instructions. Take 40 mg once daily, may take a second 40 mg dose as needed for acid reflux 08/23/18   [provider]  telmisartan (MICARDIS) 80 MG tablet Take 0.5 tablets (40 mg total) by mouth daily. Patient taking differently: Take 40 mg by mouth 2 (two) times daily. 10/07/13   Kelvin Cellar, MD  traMADol  (ULTRAM) 50 MG tablet Take 1 tablet (50 mg total) by mouth every 6 (six) hours as needed. 09/28/19   Brien Few, MD    Allergies    Crestor [rosuvastatin], Norvasc [amlodipine besylate], Simvastatin, Sulfa antibiotics, and Zetia [ezetimibe]  Review of Systems   Review of Systems  Respiratory: Positive for cough and shortness of breath.   All other systems reviewed and are negative.   Physical Exam Updated Vital Signs BP 140/66   Pulse 69   Temp 98.2 F (36.8 C) (Oral)   Resp 18   Ht 5\' 5"  (1.651 m)   Wt 67.1 kg   SpO2 93%  BMI 24.63 kg/m   Physical Exam Vitals and nursing note reviewed.  Constitutional:      Comments: Coughing   HENT:     Head: Normocephalic.     Mouth/Throat:     Pharynx: Oropharynx is clear.  Eyes:     Extraocular Movements: Extraocular movements intact.     Pupils: Pupils are equal, round, and reactive to light.  Cardiovascular:     Rate and Rhythm: Normal rate and regular rhythm.  Pulmonary:     Comments: Diminished bilateral bases  Abdominal:     General: Bowel sounds are normal.     Palpations: Abdomen is soft.  Musculoskeletal:        General: Normal range of motion.     Cervical back: Normal range of motion and neck supple.  Skin:    General: Skin is warm.     Capillary Refill: Capillary refill takes less than 2 seconds.  Neurological:     General: No focal deficit present.     Mental Status: She is oriented to person, place, and time.  Psychiatric:        Mood and Affect: Mood normal.        Behavior: Behavior normal.     ED Results / Procedures / Treatments   Labs (all labs ordered are listed, but only abnormal results are displayed) Labs Reviewed  COMPREHENSIVE METABOLIC PANEL - Abnormal; Notable for the following components:      Result Value   Potassium 3.3 (*)    Glucose, Bld 113 (*)    BUN 26 (*)    Creatinine, Ser 1.06 (*)    GFR, Estimated 53 (*)    All other components within normal limits  CBC WITH  DIFFERENTIAL/PLATELET  LIPASE, BLOOD  TROPONIN I (HIGH SENSITIVITY)  TROPONIN I (HIGH SENSITIVITY)    EKG EKG Interpretation  Date/Time:  Thursday June 21 2020 18:58:29 EDT Ventricular Rate:  55 PR Interval:  182 QRS Duration: 92 QT Interval:  433 QTC Calculation: 415 R Axis:   8 Text Interpretation: Sinus rhythm No significant change since last tracing Confirmed by Wandra Arthurs [phone]) on 06/21/2020 8:34:12 PM   Radiology CT Angio Chest PE W and/or Wo Contrast  Result Date: 06/21/2020 CLINICAL DATA:  Vomiting, reflux, cough, short of breath EXAM: CT ANGIOGRAPHY CHEST WITH CONTRAST TECHNIQUE: Multidetector CT imaging of the chest was performed using the standard protocol during bolus administration of intravenous contrast. Multiplanar CT image reconstructions and MIPs were obtained to evaluate the vascular anatomy. CONTRAST:  37mL OMNIPAQUE IOHEXOL 350 MG/ML SOLN COMPARISON:  10/06/2013, 06/21/2020 FINDINGS: Cardiovascular: This is a technically adequate evaluation of the pulmonary vasculature. No filling defects or pulmonary emboli. The heart is unremarkable without pericardial effusion. Extensive atherosclerosis of the coronary vasculature. No evidence of thoracic aortic aneurysm. Diffuse atherosclerosis of the aortic arch and descending thoracic aorta. Mediastinum/Nodes: No enlarged mediastinal, hilar, or axillary lymph nodes. Thyroid gland, trachea, and esophagus demonstrate no significant findings. Lungs/Pleura: No acute airspace disease, effusion, or pneumothorax. Hypoventilatory changes are seen within the lower lobes. Central airways are patent. Upper Abdomen: Bilateral renal cysts. Minimal gallbladder sludge without cholecystitis. Musculoskeletal: There are no acute displaced fractures. Severe right shoulder osteoarthritis. Stable left shoulder arthroplasty. Reconstructed images demonstrate no additional findings. Review of the MIP images confirms the above findings. IMPRESSION: 1. No  evidence of pulmonary embolus. 2. No acute intrathoracic process. 3. Gallbladder sludge without cholecystitis. 4. Aortic Atherosclerosis (ICD10-I70.0). Coronary artery atherosclerosis. Electronically Signed   By: Legrand Como  Owens Shark M.D.   On: 06/21/2020 22:22   DG Chest Port 1 View  Result Date: 06/21/2020 CLINICAL DATA:  Acute onset of cough and shortness of breath. Former smoker. EXAM: PORTABLE CHEST 1 VIEW COMPARISON:  Chest x-ray 10/05/2013.  CTA chest 10/06/2013. FINDINGS: Cardiac silhouette normal in size, unchanged. Thoracic aorta atherosclerotic, unchanged. Hilar and mediastinal contours otherwise unremarkable. Prominent bronchovascular markings diffusely and moderate central peribronchial thickening, unchanged. Lungs otherwise clear. No localized airspace consolidation. No pleural effusions. No pneumothorax. Normal pulmonary vascularity. Prior LEFT shoulder arthroplasty with modeling of the LEFT glenoid. Severe degenerative changes involving the RIGHT shoulder. IMPRESSION: Stable moderate changes of chronic bronchitis and/or asthma. No acute cardiopulmonary disease. Electronically Signed   By: Evangeline Dakin M.D.   On: 06/21/2020 18:26    Procedures Procedures   Medications Ordered in ED Medications  sodium chloride 0.9 % bolus 1,000 mL (0 mLs Intravenous Stopped 06/21/20 2114)  famotidine (PEPCID) IVPB 20 mg premix (0 mg Intravenous Stopped 06/21/20 2050)  pantoprazole (PROTONIX) injection 40 mg (40 mg Intravenous Given 06/21/20 2015)  ondansetron (ZOFRAN) injection 4 mg (4 mg Intravenous Given 06/21/20 2018)  alum & mag hydroxide-simeth (MAALOX/MYLANTA) 200-200-20 MG/5ML suspension 30 mL (30 mLs Oral Given 06/21/20 2115)    And  lidocaine (XYLOCAINE) 2 % viscous mouth solution 15 mL (15 mLs Oral Given 06/21/20 2115)  iohexol (OMNIPAQUE) 350 MG/ML injection 75 mL (75 mLs Intravenous Contrast Given 06/21/20 2155)    ED Course  I have reviewed the triage vital signs and the nursing  notes.  Pertinent labs & imaging results that were available during my care of the patient were reviewed by me and considered in my medical decision making (see chart for details).    MDM Rules/Calculators/A&P                         Maricruz Lucero is a 80 y.o. female here with cough and some vomiting.  I wonder if she has some reflux.  Consider also aspiration pneumonia as well.  Patient has no history of PE and is not tachycardic.  Plan to get CBC, CMP, chest x-ray.  Will hydrate patient and give antiemetics and Pepcid and Protonix  8:30 pm Labs unremarkable but she still nauseated and has subjective shortness of breath.  We will get a CTA to further assess  10:33 PM CT showed gallbladder sludge.  No acute [place].  Patient is feeling better now.  I still think she likely has reflux versus stomach ulcer versus symptomatic currently.  Her white blood cell count is normal and LFTs are normal.  Will start on Carafate and follow-up with her [facility].  We will also have her follow-up with general surgery as well for the gallbladder sludge.   Final Clinical Impression(s) / ED Diagnoses Final diagnoses:  None    Rx / DC Orders ED Discharge Orders    None       Drenda Freeze, MD 06/21/20 2234

## 2020-06-21 NOTE — Discharge Instructions (Addendum)
Please continue taking your omeprazole as prescribed.  Take Carafate as needed to help protect your stomach.  See your GI doctor for follow-up  You have gallbladder sludge and will need to follow-up with a GI surgeon  Return to ER if you have worse vomiting, trouble breathing, abdominal pain, fever

## 2020-07-06 DIAGNOSIS — K219 Gastro-esophageal reflux disease without esophagitis: Secondary | ICD-10-CM | POA: Diagnosis not present

## 2020-07-06 DIAGNOSIS — Z6824 Body mass index (BMI) 24.0-24.9, adult: Secondary | ICD-10-CM | POA: Diagnosis not present

## 2020-07-10 DIAGNOSIS — K2100 Gastro-esophageal reflux disease with esophagitis, without bleeding: Secondary | ICD-10-CM | POA: Diagnosis not present

## 2020-07-24 DIAGNOSIS — M2550 Pain in unspecified joint: Secondary | ICD-10-CM | POA: Diagnosis not present

## 2020-07-24 DIAGNOSIS — Z6823 Body mass index (BMI) 23.0-23.9, adult: Secondary | ICD-10-CM | POA: Diagnosis not present

## 2020-07-24 DIAGNOSIS — E559 Vitamin D deficiency, unspecified: Secondary | ICD-10-CM | POA: Diagnosis not present

## 2020-07-24 DIAGNOSIS — R7303 Prediabetes: Secondary | ICD-10-CM | POA: Diagnosis not present

## 2020-07-24 DIAGNOSIS — K219 Gastro-esophageal reflux disease without esophagitis: Secondary | ICD-10-CM | POA: Diagnosis not present

## 2020-07-24 DIAGNOSIS — R634 Abnormal weight loss: Secondary | ICD-10-CM | POA: Diagnosis not present

## 2020-07-24 DIAGNOSIS — E7800 Pure hypercholesterolemia, unspecified: Secondary | ICD-10-CM | POA: Diagnosis not present

## 2020-07-25 ENCOUNTER — Ambulatory Visit: Payer: Medicare PPO | Admitting: Podiatry

## 2020-07-27 DIAGNOSIS — M25562 Pain in left knee: Secondary | ICD-10-CM | POA: Diagnosis not present

## 2020-07-27 DIAGNOSIS — M25561 Pain in right knee: Secondary | ICD-10-CM | POA: Diagnosis not present

## 2020-07-31 ENCOUNTER — Encounter: Payer: Self-pay | Admitting: Physician Assistant

## 2020-07-31 ENCOUNTER — Ambulatory Visit: Payer: Medicare PPO | Admitting: Physician Assistant

## 2020-07-31 ENCOUNTER — Other Ambulatory Visit: Payer: Self-pay

## 2020-07-31 DIAGNOSIS — B353 Tinea pedis: Secondary | ICD-10-CM

## 2020-07-31 LAB — POCT SKIN KOH: Skin KOH, POC: POSITIVE — AB

## 2020-07-31 MED ORDER — KETOCONAZOLE 2 % EX CREA: 1.0000 "application " | TOPICAL_CREAM | Freq: Two times a day (BID) | CUTANEOUS | 2 refills | Status: AC

## 2020-07-31 NOTE — Progress Notes (Signed)
   Follow-Up Visit   Subjective  Tina Frank is a 80 y.o. female who presents for the following: Annual Exam (Recheck fungus on feet, isk on back nothing new ).   The following portions of the chart were reviewed this encounter and updated as appropriate:  Tobacco  Allergies  Meds  Problems  Med Hx  Surg Hx  Fam Hx      Objective  Well appearing patient in no apparent distress; mood and affect are within normal limits.  A full examination was performed including scalp, head, eyes, ears, nose, lips, neck, chest, axillae, abdomen, back, buttocks, bilateral upper extremities, bilateral lower extremities, hands, feet, fingers, toes, fingernails, and toenails. All findings within normal limits unless otherwise noted below.  Objective  Right and left palmar and sides of feet. Toenails.: Scaling and maceration between toes and over distal and lateral soles. All toenails thick and yellow.   Assessment & Plan  Tinea pedis of both feet Right and left palmar and sides of feet. Toenails.  POCT Skin KOH - Right and left palmar and sides of feet. Toenails. Positive.  ketoconazole (NIZORAL) 2 % cream - Right and left palmar and sides of feet. Toenails.  No atypical nevi detected.  I, Nadie Fiumara, PA-C, have reviewed all documentation's for this visit.  The documentation on 07/31/20 for the exam, diagnosis, procedures and orders are all accurate and complete.

## 2020-08-15 ENCOUNTER — Other Ambulatory Visit: Payer: Self-pay

## 2020-08-15 ENCOUNTER — Encounter: Payer: Self-pay | Admitting: Podiatry

## 2020-08-15 ENCOUNTER — Ambulatory Visit: Payer: Medicare PPO | Admitting: Podiatry

## 2020-08-15 DIAGNOSIS — K219 Gastro-esophageal reflux disease without esophagitis: Secondary | ICD-10-CM | POA: Insufficient documentation

## 2020-08-15 DIAGNOSIS — Z96612 Presence of left artificial shoulder joint: Secondary | ICD-10-CM | POA: Insufficient documentation

## 2020-08-15 DIAGNOSIS — M79676 Pain in unspecified toe(s): Secondary | ICD-10-CM | POA: Diagnosis not present

## 2020-08-15 DIAGNOSIS — R1311 Dysphagia, oral phase: Secondary | ICD-10-CM | POA: Insufficient documentation

## 2020-08-15 DIAGNOSIS — B351 Tinea unguium: Secondary | ICD-10-CM | POA: Diagnosis not present

## 2020-08-15 DIAGNOSIS — E119 Type 2 diabetes mellitus without complications: Secondary | ICD-10-CM | POA: Diagnosis not present

## 2020-08-15 DIAGNOSIS — E7800 Pure hypercholesterolemia, unspecified: Secondary | ICD-10-CM | POA: Insufficient documentation

## 2020-08-15 DIAGNOSIS — R490 Dysphonia: Secondary | ICD-10-CM | POA: Insufficient documentation

## 2020-08-15 DIAGNOSIS — M109 Gout, unspecified: Secondary | ICD-10-CM | POA: Insufficient documentation

## 2020-08-15 DIAGNOSIS — Z8639 Personal history of other endocrine, nutritional and metabolic disease: Secondary | ICD-10-CM | POA: Insufficient documentation

## 2020-08-15 DIAGNOSIS — M810 Age-related osteoporosis without current pathological fracture: Secondary | ICD-10-CM | POA: Insufficient documentation

## 2020-08-15 DIAGNOSIS — R7303 Prediabetes: Secondary | ICD-10-CM | POA: Insufficient documentation

## 2020-08-15 DIAGNOSIS — E559 Vitamin D deficiency, unspecified: Secondary | ICD-10-CM | POA: Insufficient documentation

## 2020-08-15 DIAGNOSIS — H539 Unspecified visual disturbance: Secondary | ICD-10-CM | POA: Insufficient documentation

## 2020-08-15 DIAGNOSIS — M19072 Primary osteoarthritis, left ankle and foot: Secondary | ICD-10-CM | POA: Insufficient documentation

## 2020-08-15 DIAGNOSIS — I700 Atherosclerosis of aorta: Secondary | ICD-10-CM | POA: Insufficient documentation

## 2020-08-15 DIAGNOSIS — D3500 Benign neoplasm of unspecified adrenal gland: Secondary | ICD-10-CM | POA: Insufficient documentation

## 2020-08-15 DIAGNOSIS — M2550 Pain in unspecified joint: Secondary | ICD-10-CM | POA: Insufficient documentation

## 2020-08-15 DIAGNOSIS — N281 Cyst of kidney, acquired: Secondary | ICD-10-CM | POA: Insufficient documentation

## 2020-08-15 NOTE — Progress Notes (Signed)
This patient returns to my office for at risk foot care.  This patient requires this care by a professional since this patient will be at risk due to having diabetes type 2.  This patient is unable to cut nails herself since the patient cannot reach her nails.These nails are painful walking and wearing shoes.  This patient presents for at risk foot care today.  General Appearance  Alert, conversant and in no acute stress.  Vascular  Dorsalis pedis and posterior tibial  pulses are palpable  bilaterally.  Capillary return is within normal limits  bilaterally. Temperature is within normal limits  bilaterally.  Neurologic  Senn-Weinstein monofilament wire test within normal limits  bilaterally. Muscle power within normal limits bilaterally.  Nails Thick disfigured discolored nails with subungual debris  from hallux to fifth toes bilaterally. No evidence of bacterial infection or drainage bilaterally.  Orthopedic  No limitations of motion  feet .  No crepitus or effusions noted.  No bony pathology or digital deformities noted.  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  Skin  normotropic skin with no porokeratosis noted bilaterally.  No signs of infections or ulcers noted.     Onychomycosis  Pain in right toes  Pain in left toes  Chronic arthrits  Midfoot  B/L.  Consent was obtained for treatment procedures.   Mechanical debridement of nails 1-5  bilaterally performed with a nail nipper.  Filed with dremel without incident.   Return office visit      9 weeks               Told patient to return for periodic foot care and evaluation due to potential at risk complications.   Mavis Gravelle DPM  

## 2020-08-16 DIAGNOSIS — M810 Age-related osteoporosis without current pathological fracture: Secondary | ICD-10-CM | POA: Diagnosis not present

## 2020-08-17 DIAGNOSIS — M19011 Primary osteoarthritis, right shoulder: Secondary | ICD-10-CM | POA: Diagnosis not present

## 2020-08-24 DIAGNOSIS — M19011 Primary osteoarthritis, right shoulder: Secondary | ICD-10-CM | POA: Diagnosis not present

## 2020-08-31 DIAGNOSIS — M170 Bilateral primary osteoarthritis of knee: Secondary | ICD-10-CM | POA: Diagnosis not present

## 2020-08-31 DIAGNOSIS — M19011 Primary osteoarthritis, right shoulder: Secondary | ICD-10-CM | POA: Diagnosis not present

## 2020-09-07 DIAGNOSIS — M170 Bilateral primary osteoarthritis of knee: Secondary | ICD-10-CM | POA: Diagnosis not present

## 2020-09-14 DIAGNOSIS — M170 Bilateral primary osteoarthritis of knee: Secondary | ICD-10-CM | POA: Diagnosis not present

## 2020-10-10 DIAGNOSIS — K828 Other specified diseases of gallbladder: Secondary | ICD-10-CM | POA: Diagnosis not present

## 2020-10-15 ENCOUNTER — Other Ambulatory Visit: Payer: Self-pay | Admitting: Surgery

## 2020-10-15 ENCOUNTER — Other Ambulatory Visit: Payer: Self-pay | Admitting: Family Medicine

## 2020-10-15 DIAGNOSIS — R2240 Localized swelling, mass and lump, unspecified lower limb: Secondary | ICD-10-CM

## 2020-10-16 ENCOUNTER — Other Ambulatory Visit: Payer: Self-pay | Admitting: Surgery

## 2020-10-16 DIAGNOSIS — K828 Other specified diseases of gallbladder: Secondary | ICD-10-CM

## 2020-10-17 ENCOUNTER — Ambulatory Visit
Admission: RE | Admit: 2020-10-17 | Discharge: 2020-10-17 | Disposition: A | Discharge status: Home / Self Care | Payer: Medicare PPO | Source: Ambulatory Visit | Attending: Surgery | Admitting: Surgery

## 2020-10-17 ENCOUNTER — Other Ambulatory Visit: Payer: Self-pay

## 2020-10-17 DIAGNOSIS — Z0389 Encounter for observation for other suspected diseases and conditions ruled out: Secondary | ICD-10-CM | POA: Diagnosis not present

## 2020-10-17 DIAGNOSIS — K828 Other specified diseases of gallbladder: Secondary | ICD-10-CM

## 2020-10-18 DIAGNOSIS — E790 Hyperuricemia without signs of inflammatory arthritis and tophaceous disease: Secondary | ICD-10-CM | POA: Diagnosis not present

## 2020-10-18 DIAGNOSIS — Z6824 Body mass index (BMI) 24.0-24.9, adult: Secondary | ICD-10-CM | POA: Diagnosis not present

## 2020-10-18 DIAGNOSIS — M2550 Pain in unspecified joint: Secondary | ICD-10-CM | POA: Diagnosis not present

## 2020-10-18 DIAGNOSIS — M150 Primary generalized (osteo)arthritis: Secondary | ICD-10-CM | POA: Diagnosis not present

## 2020-10-18 DIAGNOSIS — R768 Other specified abnormal immunological findings in serum: Secondary | ICD-10-CM | POA: Diagnosis not present

## 2020-10-18 NOTE — Progress Notes (Signed)
USN is normal with no evidence of stones or sludge.  No need for gallbladder surgery.  Return for surgical care as needed.  [place], MD [facility] [place] [facility] Office: [phone]

## 2020-10-19 ENCOUNTER — Other Ambulatory Visit: Payer: Medicare PPO

## 2020-11-07 DIAGNOSIS — I10 Essential (primary) hypertension: Secondary | ICD-10-CM | POA: Diagnosis not present

## 2020-11-07 DIAGNOSIS — Z6827 Body mass index (BMI) 27.0-27.9, adult: Secondary | ICD-10-CM | POA: Diagnosis not present

## 2020-11-07 DIAGNOSIS — E119 Type 2 diabetes mellitus without complications: Secondary | ICD-10-CM | POA: Diagnosis not present

## 2020-11-07 DIAGNOSIS — D3502 Benign neoplasm of left adrenal gland: Secondary | ICD-10-CM | POA: Diagnosis not present

## 2020-11-21 ENCOUNTER — Other Ambulatory Visit: Payer: Self-pay

## 2020-11-21 ENCOUNTER — Ambulatory Visit: Payer: Medicare PPO | Admitting: Podiatry

## 2020-11-21 ENCOUNTER — Encounter: Payer: Self-pay | Admitting: Podiatry

## 2020-11-21 DIAGNOSIS — B351 Tinea unguium: Secondary | ICD-10-CM

## 2020-11-21 DIAGNOSIS — E119 Type 2 diabetes mellitus without complications: Secondary | ICD-10-CM

## 2020-11-21 DIAGNOSIS — M79676 Pain in unspecified toe(s): Secondary | ICD-10-CM

## 2020-11-21 DIAGNOSIS — M129 Arthropathy, unspecified: Secondary | ICD-10-CM

## 2020-11-21 NOTE — Progress Notes (Signed)
This patient returns to my office for at risk foot care.  This patient requires this care by a professional since this patient will be at risk due to having diabetes type 2.  This patient is unable to cut nails herself since the patient cannot reach her nails.These nails are painful walking and wearing shoes.  This patient presents for at risk foot care today.  General Appearance  Alert, conversant and in no acute stress.  Vascular  Dorsalis pedis and posterior tibial  pulses are palpable  bilaterally.  Capillary return is within normal limits  bilaterally. Temperature is within normal limits  bilaterally.  Neurologic  Senn-Weinstein monofilament wire test within normal limits  bilaterally. Muscle power within normal limits bilaterally.  Nails Thick disfigured discolored nails with subungual debris  from hallux to fifth toes bilaterally. No evidence of bacterial infection or drainage bilaterally.  Orthopedic  No limitations of motion  feet .  No crepitus or effusions noted.  No bony pathology or digital deformities noted.  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  Skin  normotropic skin with no porokeratosis noted bilaterally.  No signs of infections or ulcers noted.     Onychomycosis  Pain in right toes  Pain in left toes  Chronic arthrits  Midfoot  B/L.  Consent was obtained for treatment procedures.   Mechanical debridement of nails 1-5  bilaterally performed with a nail nipper.  Filed with dremel without incident.   Return office visit      10 weeks               Told patient to return for periodic foot care and evaluation due to potential at risk complications.   Gardiner Barefoot DPM

## 2020-12-18 IMAGING — MG DIGITAL SCREENING BILAT W/ TOMO W/ CAD
6 of 10 series · 6 of 30 positions shown · non-contrast
Comparison: Previous exam(s).

CLINICAL DATA: Screening.

EXAM:
DIGITAL SCREENING BILATERAL MAMMOGRAM WITH TOMO AND CAD

[R MLO synth-2D (1 of 2)]
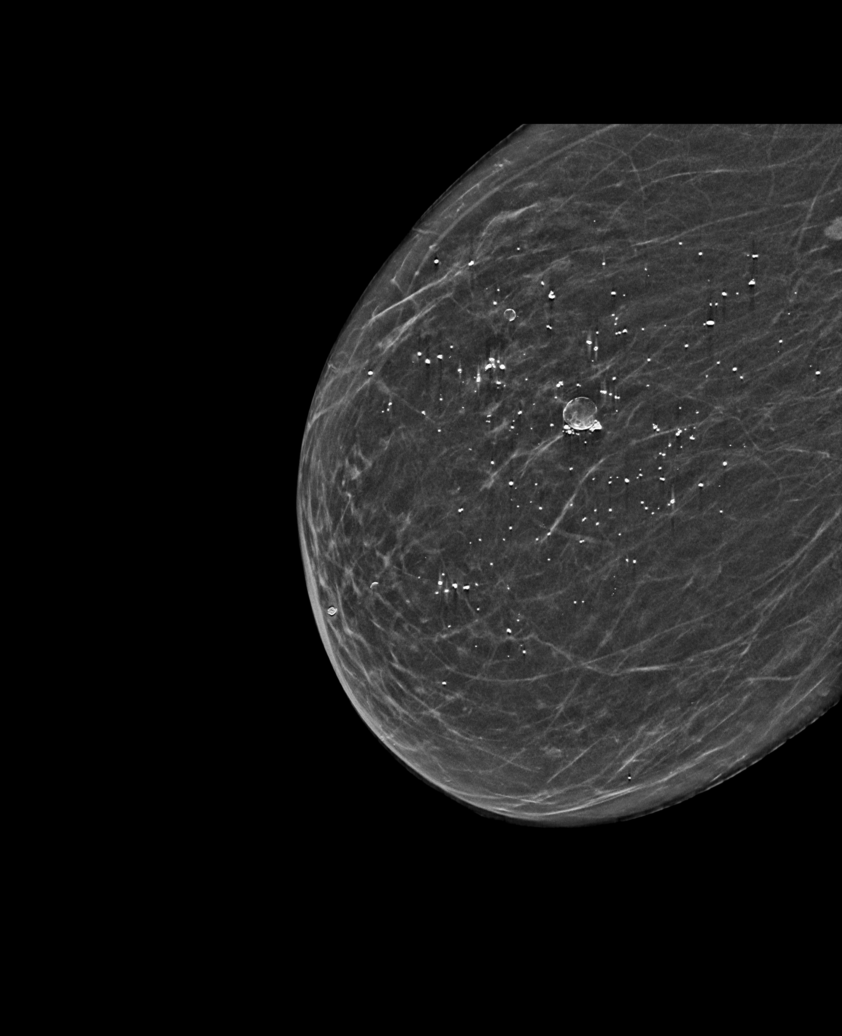

[R CC synth-2D]
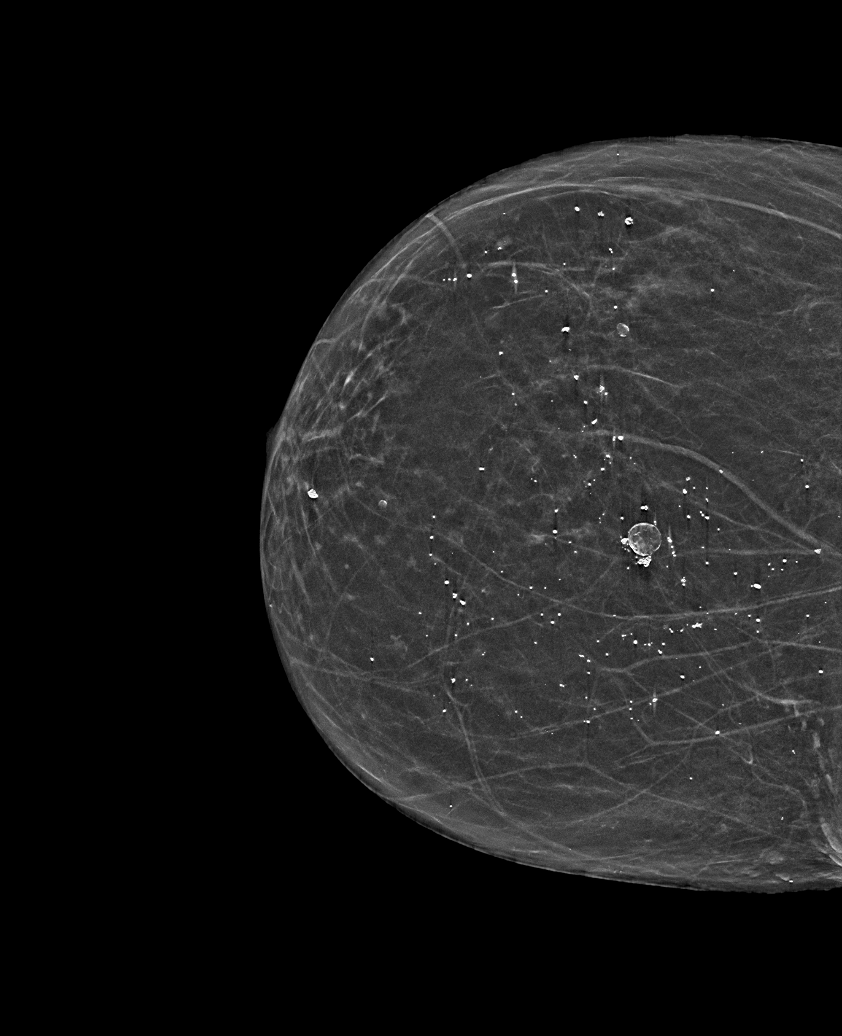

[L MLO synth-2D]
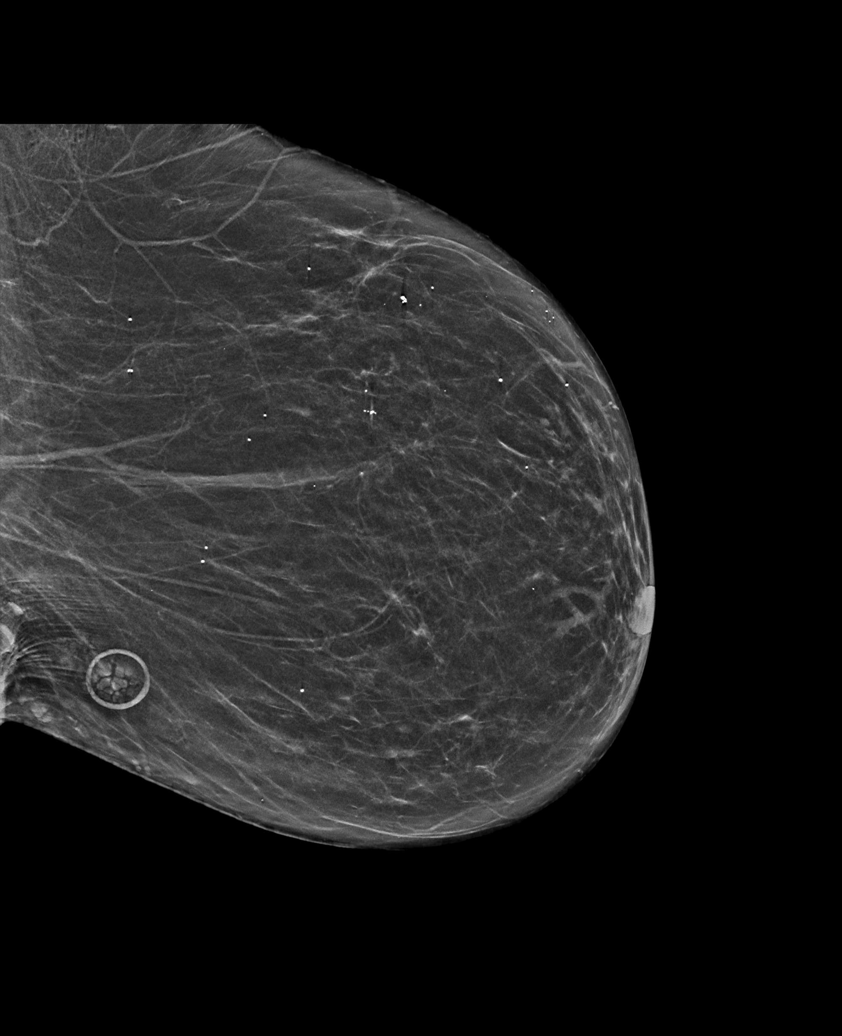

[R MLO synth-2D (2 of 2)]
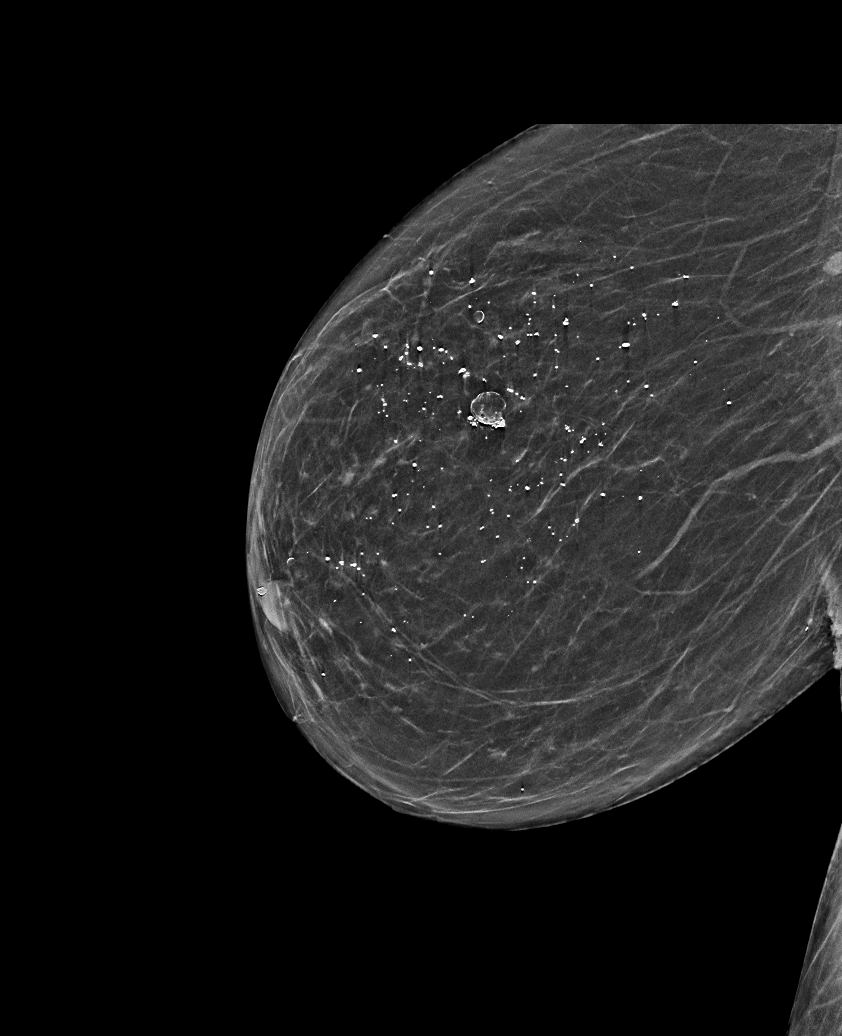

[L CC synth-2D]
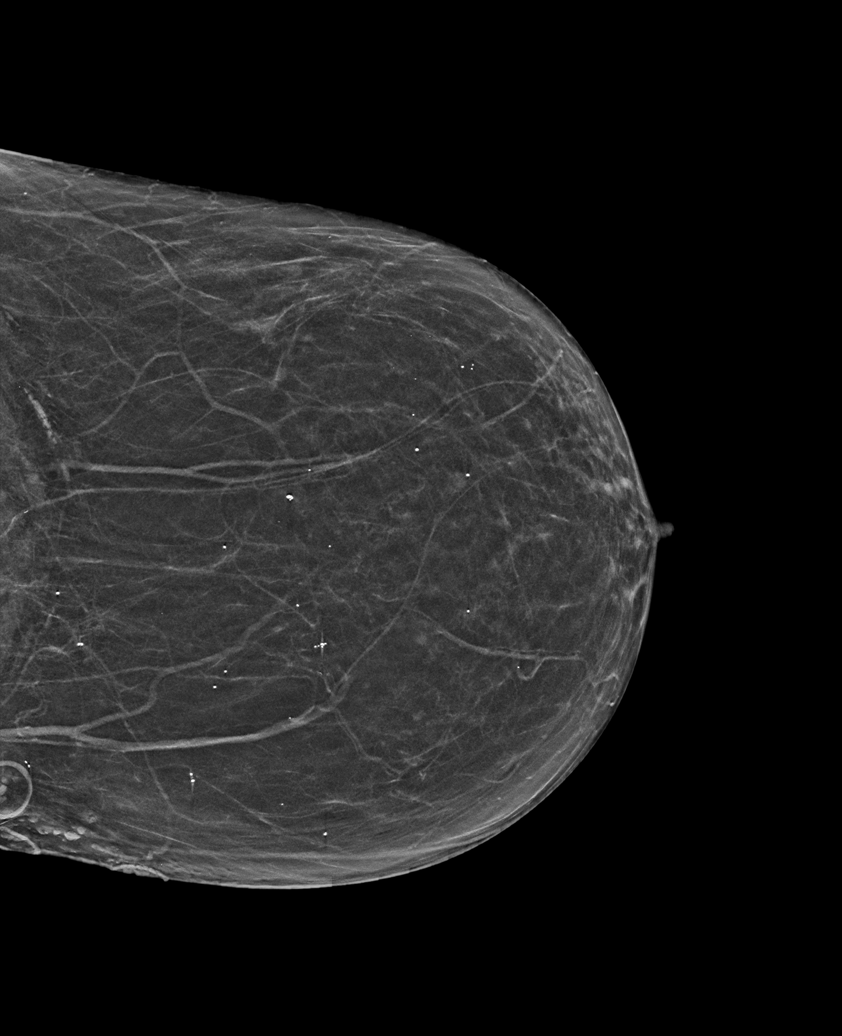

[R MLO tomo · tomo slice 25/50.0]
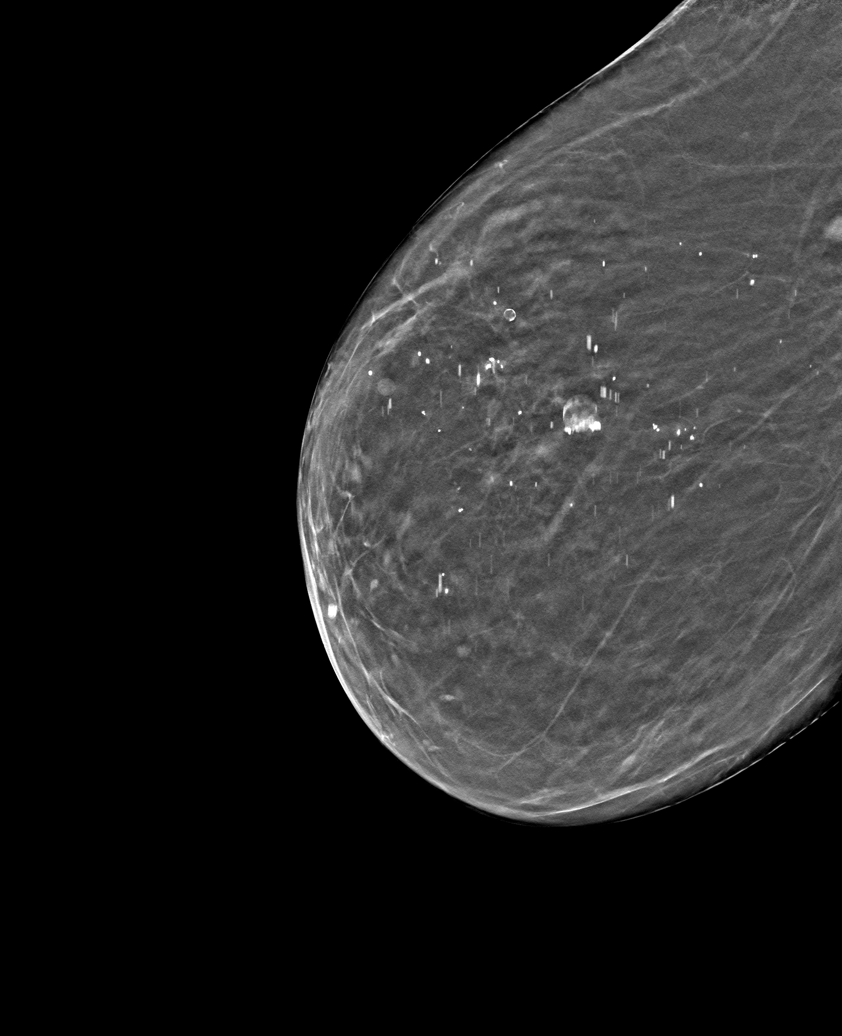

[6 of 30 positions shown; findings below may reference images not displayed]

ACR Breast Density Category b: There are scattered areas of
fibroglandular density.
FINDINGS: There are no findings suspicious for malignancy. Images were
processed with CAD.
IMPRESSION: No mammographic evidence of malignancy. A result letter of this
screening mammogram will be mailed directly to the patient.

RECOMMENDATION:
Screening mammogram in one year. (Code:[id number])

BI-RADS CATEGORY  1: Negative.

## 2021-01-21 ENCOUNTER — Ambulatory Visit: Payer: Medicare PPO | Admitting: Physician Assistant

## 2021-01-21 ENCOUNTER — Encounter: Payer: Self-pay | Admitting: Physician Assistant

## 2021-01-21 ENCOUNTER — Other Ambulatory Visit: Payer: Self-pay

## 2021-01-21 DIAGNOSIS — B353 Tinea pedis: Secondary | ICD-10-CM

## 2021-01-21 MED ORDER — KETOCONAZOLE 2 % EX CREA: TOPICAL_CREAM | CUTANEOUS | 0 refills | Status: DC

## 2021-01-22 ENCOUNTER — Encounter: Payer: Self-pay | Admitting: Physician Assistant

## 2021-01-22 NOTE — Progress Notes (Signed)
   Follow-Up Visit   Subjective  Tina Frank is a 80 y.o. female who presents for the following: Follow-up (Patient here today for follow up of tinea pedis on both feet, per patient the Ketoconazole cream is helping. Per patient she has some lesion on the back of both ankles, patient states that she's used the Ketoconazole on the lesions and its not helping. ).   The following portions of the chart were reviewed this encounter and updated as appropriate:  Tobacco  Allergies  Meds  Problems  Med Hx  Surg Hx  Fam Hx      Objective  Well appearing patient in no apparent distress; mood and affect are within normal limits.  A focused examination was performed including feet and back. Relevant physical exam findings are noted in the Assessment and Plan.  Both feet Scaling and maceration between toes and over distal and lateral soles has resolved and clear with thick yellow toenails.   Assessment & Plan  Tinea pedis of right foot Both feet  Continue treatment.  ketoconazole (NIZORAL) 2 % cream - Both feet Apply to feet nightly    I, Barbee Mamula, PA-C, have reviewed all documentation's for this visit.  The documentation on 01/22/21 for the exam, diagnosis, procedures and orders are all accurate and complete.

## 2021-01-24 ENCOUNTER — Ambulatory Visit: Payer: Medicare PPO | Admitting: Physician Assistant

## 2021-02-04 ENCOUNTER — Encounter: Payer: Self-pay | Admitting: Podiatry

## 2021-02-04 ENCOUNTER — Other Ambulatory Visit: Payer: Self-pay

## 2021-02-04 ENCOUNTER — Ambulatory Visit: Payer: Medicare PPO | Admitting: Podiatry

## 2021-02-04 DIAGNOSIS — M129 Arthropathy, unspecified: Secondary | ICD-10-CM | POA: Diagnosis not present

## 2021-02-04 DIAGNOSIS — E119 Type 2 diabetes mellitus without complications: Secondary | ICD-10-CM

## 2021-02-04 DIAGNOSIS — M79676 Pain in unspecified toe(s): Secondary | ICD-10-CM

## 2021-02-04 DIAGNOSIS — B351 Tinea unguium: Secondary | ICD-10-CM | POA: Diagnosis not present

## 2021-02-04 NOTE — Progress Notes (Signed)
This patient returns to my office for at risk foot care.  This patient requires this care by a professional since this patient will be at risk due to having diabetes type 2.  This patient is unable to cut nails herself since the patient cannot reach her nails.These nails are painful walking and wearing shoes.  This patient presents for at risk foot care today.  General Appearance  Alert, conversant and in no acute stress.  Vascular  Dorsalis pedis and posterior tibial  pulses are palpable  bilaterally.  Capillary return is within normal limits  bilaterally. Temperature is within normal limits  bilaterally.  Neurologic  Senn-Weinstein monofilament wire test within normal limits  bilaterally. Muscle power within normal limits bilaterally.  Nails Thick disfigured discolored nails with subungual debris  from hallux to fifth toes bilaterally. No evidence of bacterial infection or drainage bilaterally.  Orthopedic  No limitations of motion  feet .  No crepitus or effusions noted.  No bony pathology or digital deformities noted.  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  Skin  normotropic skin with no porokeratosis noted bilaterally.  No signs of infections or ulcers noted.     Onychomycosis  Pain in right toes  Pain in left toes  Chronic arthrits  Midfoot  B/L.  Consent was obtained for treatment procedures.   Mechanical debridement of nails 1-5  bilaterally performed with a nail nipper.  Filed with dremel without incident.   Return office visit      10 weeks               Told patient to return for periodic foot care and evaluation due to potential at risk complications.   Gardiner Barefoot DPM

## 2021-02-18 DIAGNOSIS — I10 Essential (primary) hypertension: Secondary | ICD-10-CM | POA: Diagnosis not present

## 2021-02-18 DIAGNOSIS — M810 Age-related osteoporosis without current pathological fracture: Secondary | ICD-10-CM | POA: Diagnosis not present

## 2021-02-18 DIAGNOSIS — E7800 Pure hypercholesterolemia, unspecified: Secondary | ICD-10-CM | POA: Diagnosis not present

## 2021-02-18 DIAGNOSIS — Z1389 Encounter for screening for other disorder: Secondary | ICD-10-CM | POA: Diagnosis not present

## 2021-02-18 DIAGNOSIS — Z0000 Encounter for general adult medical examination without abnormal findings: Secondary | ICD-10-CM | POA: Diagnosis not present

## 2021-03-05 ENCOUNTER — Other Ambulatory Visit: Payer: Self-pay | Admitting: Physician Assistant

## 2021-03-05 DIAGNOSIS — B353 Tinea pedis: Secondary | ICD-10-CM

## 2021-04-12 DIAGNOSIS — M170 Bilateral primary osteoarthritis of knee: Secondary | ICD-10-CM | POA: Diagnosis not present

## 2021-04-12 DIAGNOSIS — M19011 Primary osteoarthritis, right shoulder: Secondary | ICD-10-CM | POA: Diagnosis not present

## 2021-04-18 DIAGNOSIS — E790 Hyperuricemia without signs of inflammatory arthritis and tophaceous disease: Secondary | ICD-10-CM | POA: Diagnosis not present

## 2021-04-18 DIAGNOSIS — M150 Primary generalized (osteo)arthritis: Secondary | ICD-10-CM | POA: Diagnosis not present

## 2021-04-18 DIAGNOSIS — M2550 Pain in unspecified joint: Secondary | ICD-10-CM | POA: Diagnosis not present

## 2021-04-18 DIAGNOSIS — R768 Other specified abnormal immunological findings in serum: Secondary | ICD-10-CM | POA: Diagnosis not present

## 2021-04-18 DIAGNOSIS — Z6823 Body mass index (BMI) 23.0-23.9, adult: Secondary | ICD-10-CM | POA: Diagnosis not present

## 2021-04-19 DIAGNOSIS — M19011 Primary osteoarthritis, right shoulder: Secondary | ICD-10-CM | POA: Diagnosis not present

## 2021-04-19 DIAGNOSIS — M170 Bilateral primary osteoarthritis of knee: Secondary | ICD-10-CM | POA: Diagnosis not present

## 2021-04-26 DIAGNOSIS — M170 Bilateral primary osteoarthritis of knee: Secondary | ICD-10-CM | POA: Diagnosis not present

## 2021-04-26 DIAGNOSIS — M19011 Primary osteoarthritis, right shoulder: Secondary | ICD-10-CM | POA: Diagnosis not present

## 2021-04-30 ENCOUNTER — Other Ambulatory Visit: Payer: Self-pay | Admitting: Physician Assistant

## 2021-04-30 DIAGNOSIS — B353 Tinea pedis: Secondary | ICD-10-CM

## 2021-04-30 NOTE — Telephone Encounter (Signed)
Patient needs follow up by November to continue refills

## 2021-05-07 DIAGNOSIS — M542 Cervicalgia: Secondary | ICD-10-CM | POA: Diagnosis not present

## 2021-05-07 DIAGNOSIS — M25512 Pain in left shoulder: Secondary | ICD-10-CM | POA: Diagnosis not present

## 2021-05-08 ENCOUNTER — Encounter: Payer: Self-pay | Admitting: Podiatry

## 2021-05-08 ENCOUNTER — Ambulatory Visit: Payer: Medicare PPO | Admitting: Podiatry

## 2021-05-08 ENCOUNTER — Other Ambulatory Visit: Payer: Self-pay

## 2021-05-08 DIAGNOSIS — M129 Arthropathy, unspecified: Secondary | ICD-10-CM

## 2021-05-08 DIAGNOSIS — B351 Tinea unguium: Secondary | ICD-10-CM

## 2021-05-08 DIAGNOSIS — E119 Type 2 diabetes mellitus without complications: Secondary | ICD-10-CM | POA: Diagnosis not present

## 2021-05-08 DIAGNOSIS — M79676 Pain in unspecified toe(s): Secondary | ICD-10-CM

## 2021-05-08 NOTE — Progress Notes (Signed)
This patient returns to my office for at risk foot care.  This patient requires this care by a professional since this patient will be at risk due to having diabetes type 2.  This patient is unable to cut nails herself since the patient cannot reach her nails.These nails are painful walking and wearing shoes.  This patient presents for at risk foot care today.  General Appearance  Alert, conversant and in no acute stress.  Vascular  Dorsalis pedis and posterior tibial  pulses are palpable  bilaterally.  Capillary return is within normal limits  bilaterally. Temperature is within normal limits  bilaterally.  Neurologic  Senn-Weinstein monofilament wire test within normal limits  bilaterally. Muscle power within normal limits bilaterally.  Nails Thick disfigured discolored nails with subungual debris  from hallux to fifth toes bilaterally. No evidence of bacterial infection or drainage bilaterally.  Orthopedic  No limitations of motion  feet .  No crepitus or effusions noted.  No bony pathology or digital deformities noted.  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  Skin  normotropic skin with no porokeratosis noted bilaterally.  No signs of infections or ulcers noted.     Onychomycosis  Pain in right toes  Pain in left toes  Chronic arthrits  Midfoot  B/L.  Consent was obtained for treatment procedures.   Mechanical debridement of nails 1-5  bilaterally performed with a nail nipper.  Filed with dremel without incident.   Return office visit      9 weeks               Told patient to return for periodic foot care and evaluation due to potential at risk complications.   Cailean Heacock DPM  

## 2021-05-24 DIAGNOSIS — I10 Essential (primary) hypertension: Secondary | ICD-10-CM | POA: Diagnosis not present

## 2021-05-24 DIAGNOSIS — D3502 Benign neoplasm of left adrenal gland: Secondary | ICD-10-CM | POA: Diagnosis not present

## 2021-05-24 DIAGNOSIS — Z6827 Body mass index (BMI) 27.0-27.9, adult: Secondary | ICD-10-CM | POA: Diagnosis not present

## 2021-05-24 DIAGNOSIS — E119 Type 2 diabetes mellitus without complications: Secondary | ICD-10-CM | POA: Diagnosis not present

## 2021-05-27 DIAGNOSIS — K2100 Gastro-esophageal reflux disease with esophagitis, without bleeding: Secondary | ICD-10-CM | POA: Diagnosis not present

## 2021-05-28 DIAGNOSIS — Z961 Presence of intraocular lens: Secondary | ICD-10-CM | POA: Diagnosis not present

## 2021-05-28 DIAGNOSIS — H2511 Age-related nuclear cataract, right eye: Secondary | ICD-10-CM | POA: Diagnosis not present

## 2021-06-04 DIAGNOSIS — M25512 Pain in left shoulder: Secondary | ICD-10-CM | POA: Diagnosis not present

## 2021-07-10 ENCOUNTER — Encounter: Payer: Self-pay | Admitting: Podiatry

## 2021-07-10 ENCOUNTER — Ambulatory Visit: Payer: Medicare PPO | Admitting: Podiatry

## 2021-07-10 DIAGNOSIS — M129 Arthropathy, unspecified: Secondary | ICD-10-CM | POA: Diagnosis not present

## 2021-07-10 DIAGNOSIS — B351 Tinea unguium: Secondary | ICD-10-CM | POA: Diagnosis not present

## 2021-07-10 DIAGNOSIS — E119 Type 2 diabetes mellitus without complications: Secondary | ICD-10-CM | POA: Diagnosis not present

## 2021-07-10 DIAGNOSIS — M79676 Pain in unspecified toe(s): Secondary | ICD-10-CM

## 2021-07-10 NOTE — Progress Notes (Signed)
This patient returns to my office for at risk foot care.  This patient requires this care by a professional since this patient will be at risk due to having diabetes type 2.  This patient is unable to cut nails herself since the patient cannot reach her nails.These nails are painful walking and wearing shoes.  This patient presents for at risk foot care today.  General Appearance  Alert, conversant and in no acute stress.  Vascular  Dorsalis pedis and posterior tibial  pulses are palpable  bilaterally.  Capillary return is within normal limits  bilaterally. Temperature is within normal limits  bilaterally.  Neurologic  Senn-Weinstein monofilament wire test within normal limits  bilaterally. Muscle power within normal limits bilaterally.  Nails Thick disfigured discolored nails with subungual debris  from hallux to fifth toes bilaterally. No evidence of bacterial infection or drainage bilaterally.  Orthopedic  No limitations of motion  feet .  No crepitus or effusions noted.  No bony pathology or digital deformities noted.  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  Skin  normotropic skin with no porokeratosis noted bilaterally.  No signs of infections or ulcers noted.     Onychomycosis  Pain in right toes  Pain in left toes  Chronic arthrits  Midfoot  B/L.  Consent was obtained for treatment procedures.   Mechanical debridement of nails 1-5  bilaterally performed with a nail nipper.  Filed with dremel without incident.   Return office visit      9 weeks               Told patient to return for periodic foot care and evaluation due to potential at risk complications.   Jaeliana Lococo DPM  

## 2021-07-23 ENCOUNTER — Ambulatory Visit: Payer: Medicare PPO | Admitting: Physician Assistant

## 2021-07-23 ENCOUNTER — Encounter: Payer: Self-pay | Admitting: Physician Assistant

## 2021-07-23 DIAGNOSIS — B353 Tinea pedis: Secondary | ICD-10-CM

## 2021-07-23 DIAGNOSIS — L820 Inflamed seborrheic keratosis: Secondary | ICD-10-CM

## 2021-07-23 MED ORDER — KETOCONAZOLE 2 % EX CREA: TOPICAL_CREAM | CUTANEOUS | 0 refills | Status: DC

## 2021-07-23 NOTE — Patient Instructions (Signed)
[facility] ?

## 2021-07-23 NOTE — Progress Notes (Signed)
? ?  Follow-Up Visit ?  ?Subjective  ?Tina Frank is a 81 y.o. female who presents for the following: Follow-up (6 month follow up on feet. Tx ketoconazole cream. Patient needs refill. Also check patients back and between breast. ). ? ? ?The following portions of the chart were reviewed this encounter and updated as appropriate:  Tobacco  Allergies  Meds  Problems  Med Hx  Surg Hx  Fam Hx   ?  ? ?Objective  ?Well appearing patient in no apparent distress; mood and affect are within normal limits. ? ?A full examination was performed including scalp, head, eyes, ears, nose, lips, neck, chest, axillae, abdomen, back, buttocks, bilateral upper extremities, bilateral lower extremities, hands, feet, fingers, toes, fingernails, and toenails. All findings within normal limits unless otherwise noted below. ? ?Left Inframammary Fold (15), Right Inframammary Fold ?Stuck-on, waxy papules and plaques.  ? ?right and left feet ?Redness with small amount of scale.  ? ? ?Assessment & Plan  ?Inflamed seborrheic keratosis (16) ?Left Inframammary Fold (15); Right Inframammary Fold ? ?Destruction of lesion - Left Inframammary Fold, Right Inframammary Fold ?Complexity: simple   ?Destruction method: cryotherapy   ?Informed consent: discussed and consent obtained   ?Timeout:  patient name, date of birth, surgical site, and procedure verified ?Lesion destroyed using liquid nitrogen: Yes   ?Cryotherapy cycles:  1 ?Outcome: patient tolerated procedure well with no complications   ?Post-procedure details: wound care instructions given   ? ?Tinea pedis of both feet ?right and left feet ? ?ketoconazole (NIZORAL) 2 % cream - right and left feet ?APPLY TOPICALLY TO FEET EVERY NIGHT ? ? ? ?I, Zamiya Dillard, PA-C, have reviewed all documentation's for this visit.  The documentation on 07/23/21 for the exam, diagnosis, procedures and orders are all accurate and complete. ?

## 2021-07-25 DIAGNOSIS — R768 Other specified abnormal immunological findings in serum: Secondary | ICD-10-CM | POA: Diagnosis not present

## 2021-07-25 DIAGNOSIS — M79641 Pain in right hand: Secondary | ICD-10-CM | POA: Diagnosis not present

## 2021-07-25 DIAGNOSIS — M810 Age-related osteoporosis without current pathological fracture: Secondary | ICD-10-CM | POA: Diagnosis not present

## 2021-07-25 DIAGNOSIS — M1991 Primary osteoarthritis, unspecified site: Secondary | ICD-10-CM | POA: Diagnosis not present

## 2021-07-25 DIAGNOSIS — E790 Hyperuricemia without signs of inflammatory arthritis and tophaceous disease: Secondary | ICD-10-CM | POA: Diagnosis not present

## 2021-07-25 DIAGNOSIS — M79672 Pain in left foot: Secondary | ICD-10-CM | POA: Diagnosis not present

## 2021-07-25 DIAGNOSIS — M542 Cervicalgia: Secondary | ICD-10-CM | POA: Diagnosis not present

## 2021-07-25 DIAGNOSIS — Z6824 Body mass index (BMI) 24.0-24.9, adult: Secondary | ICD-10-CM | POA: Diagnosis not present

## 2021-08-07 DIAGNOSIS — H25812 Combined forms of age-related cataract, left eye: Secondary | ICD-10-CM | POA: Diagnosis not present

## 2021-08-07 DIAGNOSIS — H2512 Age-related nuclear cataract, left eye: Secondary | ICD-10-CM | POA: Diagnosis not present

## 2021-08-20 DIAGNOSIS — M810 Age-related osteoporosis without current pathological fracture: Secondary | ICD-10-CM | POA: Diagnosis not present

## 2021-08-23 DIAGNOSIS — M545 Low back pain, unspecified: Secondary | ICD-10-CM | POA: Diagnosis not present

## 2021-08-23 DIAGNOSIS — M25511 Pain in right shoulder: Secondary | ICD-10-CM | POA: Diagnosis not present

## 2021-08-29 ENCOUNTER — Encounter: Payer: Self-pay | Admitting: Infectious Diseases

## 2021-09-11 DIAGNOSIS — M47812 Spondylosis without myelopathy or radiculopathy, cervical region: Secondary | ICD-10-CM | POA: Diagnosis not present

## 2021-09-23 ENCOUNTER — Emergency Department (HOSPITAL_COMMUNITY): Payer: Medicare PPO

## 2021-09-23 ENCOUNTER — Encounter (HOSPITAL_COMMUNITY): Payer: Self-pay

## 2021-09-23 ENCOUNTER — Emergency Department (HOSPITAL_COMMUNITY)
Admission: EM | Admit: 2021-09-23 | Discharge: 2021-09-23 | Disposition: A | Discharge status: Home / Self Care | Payer: Medicare PPO | Attending: Emergency Medicine | Admitting: Emergency Medicine

## 2021-09-23 ENCOUNTER — Other Ambulatory Visit: Payer: Self-pay

## 2021-09-23 DIAGNOSIS — I10 Essential (primary) hypertension: Secondary | ICD-10-CM | POA: Insufficient documentation

## 2021-09-23 DIAGNOSIS — Z7982 Long term (current) use of aspirin: Secondary | ICD-10-CM | POA: Diagnosis not present

## 2021-09-23 DIAGNOSIS — Y9301 Activity, walking, marching and hiking: Secondary | ICD-10-CM | POA: Diagnosis not present

## 2021-09-23 DIAGNOSIS — S0993XA Unspecified injury of face, initial encounter: Secondary | ICD-10-CM | POA: Diagnosis not present

## 2021-09-23 DIAGNOSIS — W19XXXA Unspecified fall, initial encounter: Secondary | ICD-10-CM | POA: Diagnosis not present

## 2021-09-23 DIAGNOSIS — I609 Nontraumatic subarachnoid hemorrhage, unspecified: Secondary | ICD-10-CM | POA: Insufficient documentation

## 2021-09-23 DIAGNOSIS — W010XXA Fall on same level from slipping, tripping and stumbling without subsequent striking against object, initial encounter: Secondary | ICD-10-CM | POA: Diagnosis not present

## 2021-09-23 DIAGNOSIS — M1712 Unilateral primary osteoarthritis, left knee: Secondary | ICD-10-CM | POA: Diagnosis not present

## 2021-09-23 DIAGNOSIS — S0990XA Unspecified injury of head, initial encounter: Secondary | ICD-10-CM | POA: Diagnosis not present

## 2021-09-23 DIAGNOSIS — S0181XA Laceration without foreign body of other part of head, initial encounter: Secondary | ICD-10-CM | POA: Diagnosis not present

## 2021-09-23 DIAGNOSIS — M25562 Pain in left knee: Secondary | ICD-10-CM | POA: Diagnosis not present

## 2021-09-23 DIAGNOSIS — Z79899 Other long term (current) drug therapy: Secondary | ICD-10-CM | POA: Insufficient documentation

## 2021-09-23 DIAGNOSIS — S0232XA Fracture of orbital floor, left side, initial encounter for closed fracture: Secondary | ICD-10-CM | POA: Insufficient documentation

## 2021-09-23 DIAGNOSIS — E119 Type 2 diabetes mellitus without complications: Secondary | ICD-10-CM | POA: Diagnosis not present

## 2021-09-23 DIAGNOSIS — Z043 Encounter for examination and observation following other accident: Secondary | ICD-10-CM | POA: Diagnosis not present

## 2021-09-23 DIAGNOSIS — S0592XA Unspecified injury of left eye and orbit, initial encounter: Secondary | ICD-10-CM | POA: Diagnosis present

## 2021-09-23 DIAGNOSIS — S066X0A Traumatic subarachnoid hemorrhage without loss of consciousness, initial encounter: Secondary | ICD-10-CM | POA: Diagnosis not present

## 2021-09-23 MED ORDER — ACETAMINOPHEN 325 MG PO TABS
650.0000 mg | ORAL_TABLET | Freq: Once | ORAL | Status: AC
  Administered 2021-09-23: 650 mg via ORAL
  Filled 2021-09-23: qty 2

## 2021-09-23 NOTE — ED Triage Notes (Addendum)
Patient states she tripped while walking today and fell forward. Patient c/o left knee pain and patient has facial injuries. Patient states she blew her nose and the swelling and bruising to the left eye worsened.  Patient denies LOC.

## 2021-09-23 NOTE — ED Provider Notes (Signed)
Pt's care asumed at 3pm.   Neurosurgery consulted.  Neurosurgery reports pt can go home if she has family to stay with her.  I discussed findings with pt.  Her son reports he will stay with pt.  I discussed symptoms that would indicate need to return for further evaltuion    Sidney Ace 09/23/21 [id number]    Carmin Muskrat, MD 09/24/21 [phone]

## 2021-09-23 NOTE — ED Provider Notes (Signed)
[facility] DEPT Provider Note   CSN: [id number] Arrival date & time: 09/23/21  1238     History  Chief Complaint  Patient presents with   Tina Frank is a 81 y.o. female.  Patient with history of hypertension and diabetes presents today with complaints of fall. She states that immediately prior to arrival today she was walking at the park and tripped and fell striking her face and left knee on the ground. She states that she did not loose consciousness and was able to get up off the ground and ambulate on scene without difficulty. States that her left eye then began to swell and she had a nosebleed that was concerning to her. States that after arriving here her left knee began to hurt also. She is not anticoagulated.  The history is provided by the patient. No language interpreter was used.  Fall Associated symptoms include headaches.       Home Medications Prior to Admission medications   Medication Sig Start Date End Date Taking? Authorizing Provider  acetaminophen (TYLENOL) 500 MG tablet 2 tablets as needed    [provider]  amoxicillin (AMOXIL) 500 MG capsule SMARTSIG:4 Capsule(s) By Mouth Once 03/22/20   [provider]  aspirin EC 81 MG tablet Take 81 mg by mouth daily.    [provider]  atorvastatin (LIPITOR) 10 MG tablet Take 10 mg by mouth at bedtime.     [provider]  calcium carbonate (TUMS - DOSED IN MG ELEMENTAL CALCIUM) 500 MG chewable tablet Chew 1 tablet by mouth 3 (three) times daily as needed for indigestion or heartburn.    [provider]  carvedilol (COREG) 12.5 MG tablet Take 12.5 mg by mouth 2 (two) times daily with a meal.  12/04/14   [provider]  cloNIDine (CATAPRES) 0.1 MG tablet Take 0.1 mg by mouth daily as needed (if systolic bp is over 867).    [provider]  denosumab (PROLIA) 60 MG/ML SOSY injection Inject 60 mg into the skin every 6  (six) months. 03/16/18   Campbell Riches, MD  Ergocalciferol (VITAMIN D2) 50 MCG (2000 UT) TABS 1 tablet 07/22/19   [provider]  famotidine (PEPCID) 20 MG tablet Take by mouth. 02/19/21   [provider]  furosemide (LASIX) 40 MG tablet Take 40 mg by mouth 2 (two) times daily.  08/15/17   [provider]  hydrALAZINE (APRESOLINE) 50 MG tablet Take 50 mg by mouth 2 (two) times daily.  12/04/14   [provider]  ketoconazole (NIZORAL) 2 % cream APPLY TOPICALLY TO FEET EVERY NIGHT 07/23/21   Sheffield, Vida Roller R, PA-C  ketorolac (ACULAR) 0.5 % ophthalmic solution 1 drop 4 (four) times daily. 05/05/20   [provider]  meloxicam (MOBIC) 15 MG tablet  11/03/19   [provider]  meloxicam (MOBIC) 7.5 MG tablet Take 7.5 mg by mouth 2 (two) times daily as needed. 04/20/20   [provider]  moxifloxacin (VIGAMOX) 0.5 % ophthalmic solution Apply 1 drop to eye 4 (four) times daily. 05/05/20   [provider]  ondansetron (ZOFRAN ODT) 4 MG disintegrating tablet '4mg'$  ODT q4 hours prn nausea/vomit 06/21/20   Drenda Freeze, MD  pantoprazole (PROTONIX) 40 MG tablet Take 40 mg by mouth See admin instructions. Take 40 mg once daily, may take a second 40 mg dose as needed for acid reflux 08/23/18   [provider]  pantoprazole (PROTONIX)  40 MG tablet 1 tablet    [provider]  prednisoLONE acetate (PRED FORTE) 1 % ophthalmic suspension 1 drop 4 (four) times daily. 05/05/20   [provider]  sucralfate (CARAFATE) 1 g tablet Take 1 tablet (1 g total) by mouth 4 (four) times daily -  with meals and at bedtime. 06/21/20   Drenda Freeze, MD  telmisartan (MICARDIS) 80 MG tablet Take 0.5 tablets (40 mg total) by mouth daily. Patient taking differently: Take 40 mg by mouth 2 (two) times daily. 10/07/13   Kelvin Cellar, MD  traMADol (ULTRAM) 50 MG tablet Take 1 tablet (50 mg total) by mouth every 6 (six) hours as needed.  09/28/19   Brien Few, MD      Allergies    Abaloparatide, Meloxicam, Crestor [rosuvastatin], Norvasc [amlodipine besylate], Simvastatin, Sulfa antibiotics, and Zetia [ezetimibe]    Review of Systems   Review of Systems  Skin:  Positive for wound.  Neurological:  Positive for headaches.  All other systems reviewed and are negative.   Physical Exam Updated Vital Signs BP (!) 141/72 (BP Location: Right Arm)   Pulse 65   Temp 97.8 F (36.6 C) (Oral)   Resp 14   Ht '5\' 5"'$  (1.651 m)   Wt 65.8 kg   SpO2 99%   BMI 24.13 kg/m  Physical Exam Vitals and nursing note reviewed.  Constitutional:      General: She is not in acute distress.    Appearance: Normal appearance. She is normal weight. She is not ill-appearing, toxic-appearing or diaphoretic.  HENT:     Head: Normocephalic and atraumatic.     Comments: Swelling present to the left lid and underneath the left eye. Small superficial abrasion noted above the left eyebrow.  Small bruising noted to the lower chin without deformity or crepitus    Nose:     Comments: Dried blood present in bilateral nares without signs of active bleeding or septal hematoma    Mouth/Throat:     Comments: Small superficial laceration noted to the outer middle of the bottom lip. No active bleeding Eyes:     Extraocular Movements: Extraocular movements intact.     Conjunctiva/sclera: Conjunctivae normal.     Pupils: Pupils are equal, round, and reactive to light.     Comments: EOMs intact without pain. No entrapment  Visual acuity normal  Cardiovascular:     Rate and Rhythm: Normal rate.  Pulmonary:     Effort: Pulmonary effort is normal. No respiratory distress.  Abdominal:     General: Abdomen is flat.     Palpations: Abdomen is soft.  Musculoskeletal:        General: Normal range of motion.     Cervical back: Normal range of motion.  Skin:    General: Skin is warm and dry.  Neurological:     General: No focal deficit present.      Mental Status: She is alert and oriented to person, place, and time.     GCS: GCS eye subscore is 4. GCS verbal subscore is 5. GCS motor subscore is 6.     Sensory: Sensation is intact.     Motor: Motor function is intact.     Coordination: Coordination is intact.     Gait: Gait is intact.     Comments: Alert and oriented to self, place, time and event.    Speech is fluent, clear without dysarthria or dysphasia.    Strength 5/5 in upper/lower extremities   Sensation  intact in upper/lower extremities    CN I not tested  CN II grossly intact visual fields bilaterally. Did not visualize posterior eye.  CN III, IV, VI PERRLA and EOMs intact bilaterally  CN V Intact sensation to sharp and light touch to the face  CN VII facial movements symmetric  CN VIII not tested  CN IX, X no uvula deviation, symmetric rise of soft palate  CN XI 5/5 SCM and trapezius strength bilaterally  CN XII Midline tongue protrusion, symmetric L/R movements   Psychiatric:        Mood and Affect: Mood normal.        Behavior: Behavior normal.    ED Results / Procedures / Treatments   Labs (all labs ordered are listed, but only abnormal results are displayed) Labs Reviewed - No data to display  EKG None  Radiology CT Head Wo Contrast  Result Date: 09/23/2021 CLINICAL DATA:  Trip and fall with facial injuries EXAM: CT HEAD WITHOUT CONTRAST CT MAXILLOFACIAL WITHOUT CONTRAST CT CERVICAL SPINE WITHOUT CONTRAST TECHNIQUE: Multidetector CT imaging of the head, cervical spine, and maxillofacial structures were performed using the standard protocol without intravenous contrast. Multiplanar CT image reconstructions of the cervical spine and maxillofacial structures were also generated. RADIATION DOSE REDUCTION: This exam was performed according to the departmental dose-optimization program which includes automated exposure control, adjustment of the mA and/or kV according to patient size and/or use of iterative  reconstruction technique. COMPARISON:  Brain MRI 10/06/2013, cervical spine MRI 08/24/2018 FINDINGS: CT HEAD FINDINGS Brain: There is small volume subarachnoid hemorrhage over the right occipital lobe (3-18, 7-20). There is no other acute intracranial hemorrhage or extra-axial collection. There is no evidence of acute territorial infarct. Background parenchymal volume is normal for age. The ventricles are normal in size. No intraventricular hemorrhage is seen. Patchy hypodensity in the supratentorial white matter likely reflects sequela of underlying chronic white matter microangiopathy. There is no mass lesion.  There is no mass effect or midline shift. Vascular: There is calcification of the bilateral cavernous ICAs. Skull: Normal. Negative for fracture or focal lesion. Other: None. CT MAXILLOFACIAL FINDINGS Osseous: There is an acute mildly depressed fracture of the left orbital floor with involvement of the infraorbital nerve canal there is no evidence of entrapment of the inferior rectus muscle. No other acute facial bone fracture is seen. There is no evidence of mandibular dislocation. Orbits: There is gas in the left orbit. The globe is intact. Is no evidence of retrobulbar hematoma. The right globe and orbit are intact. Bilateral lens implants are in place Sinuses: There is layering blood in the left maxillary sinus. There is mild mucosal thickening in the ethmoid air cells. Soft tissues: There is scattered soft tissue gas throughout the left face extending into the upper neck likely related to above-described fracture. CT CERVICAL SPINE FINDINGS Alignment: Normal. There is no jumped or perched facet or other evidence of traumatic malalignment. Skull base and vertebrae: Skull base alignment is maintained. Vertebral body heights are preserved. There is no evidence of acute fracture. There is partial fusion of the C4 and C5 vertebral bodies posteriorly and ankylosis across the facet joints bilaterally. Soft  tissues and spinal canal: No prevertebral fluid or swelling. No visible canal hematoma. There is asymmetric widening of the right piriform sinus and laryngeal ventricle. There is a subcentimeter calcified left thyroid nodule for which no specific imaging follow-up is required. Surgical clips are noted about the right thyroid lobe. Disc levels: There is disc space  narrowing degenerative endplate change most advanced at C4-C5 and C5-C6. Facet arthropathy is most advanced at C3-C4. There is no evidence of high-grade spinal canal stenosis. Upper chest: The imaged lung apices are clear. Other: None. IMPRESSION: 1. Small volume subarachnoid hemorrhage over the right occipital lobe. 2. Acute mildly depressed fracture of the left orbital floor without evidence of herniation of the inferior rectus muscle. There is associated soft tissue gas in the left orbit and left face extending into the upper neck. No other facial bone fracture is identified. 3. No acute fracture or traumatic malalignment of the cervical spine. 4. Findings suspicious for right vocal cord paralysis which may be postsurgical given surgical clips about the right thyroid lobe. Correlate with history. These results were called by telephone at the time of interpretation on 09/23/2021 at 2:57 pm to provider [facility] , who verbally acknowledged these results. Electronically Signed   By: Valetta Mole M.D.   On: 09/23/2021 14:58   CT Maxillofacial Wo Contrast  Result Date: 09/23/2021 CLINICAL DATA:  Trip and fall with facial injuries EXAM: CT HEAD WITHOUT CONTRAST CT MAXILLOFACIAL WITHOUT CONTRAST CT CERVICAL SPINE WITHOUT CONTRAST TECHNIQUE: Multidetector CT imaging of the head, cervical spine, and maxillofacial structures were performed using the standard protocol without intravenous contrast. Multiplanar CT image reconstructions of the cervical spine and maxillofacial structures were also generated. RADIATION DOSE REDUCTION: This exam was performed  according to the departmental dose-optimization program which includes automated exposure control, adjustment of the mA and/or kV according to patient size and/or use of iterative reconstruction technique. COMPARISON:  Brain MRI 10/06/2013, cervical spine MRI 08/24/2018 FINDINGS: CT HEAD FINDINGS Brain: There is small volume subarachnoid hemorrhage over the right occipital lobe (3-18, 7-20). There is no other acute intracranial hemorrhage or extra-axial collection. There is no evidence of acute territorial infarct. Background parenchymal volume is normal for age. The ventricles are normal in size. No intraventricular hemorrhage is seen. Patchy hypodensity in the supratentorial white matter likely reflects sequela of underlying chronic white matter microangiopathy. There is no mass lesion.  There is no mass effect or midline shift. Vascular: There is calcification of the bilateral cavernous ICAs. Skull: Normal. Negative for fracture or focal lesion. Other: None. CT MAXILLOFACIAL FINDINGS Osseous: There is an acute mildly depressed fracture of the left orbital floor with involvement of the infraorbital nerve canal there is no evidence of entrapment of the inferior rectus muscle. No other acute facial bone fracture is seen. There is no evidence of mandibular dislocation. Orbits: There is gas in the left orbit. The globe is intact. Is no evidence of retrobulbar hematoma. The right globe and orbit are intact. Bilateral lens implants are in place Sinuses: There is layering blood in the left maxillary sinus. There is mild mucosal thickening in the ethmoid air cells. Soft tissues: There is scattered soft tissue gas throughout the left face extending into the upper neck likely related to above-described fracture. CT CERVICAL SPINE FINDINGS Alignment: Normal. There is no jumped or perched facet or other evidence of traumatic malalignment. Skull base and vertebrae: Skull base alignment is maintained. Vertebral body heights are  preserved. There is no evidence of acute fracture. There is partial fusion of the C4 and C5 vertebral bodies posteriorly and ankylosis across the facet joints bilaterally. Soft tissues and spinal canal: No prevertebral fluid or swelling. No visible canal hematoma. There is asymmetric widening of the right piriform sinus and laryngeal ventricle. There is a subcentimeter calcified left thyroid nodule for which no specific  imaging follow-up is required. Surgical clips are noted about the right thyroid lobe. Disc levels: There is disc space narrowing degenerative endplate change most advanced at C4-C5 and C5-C6. Facet arthropathy is most advanced at C3-C4. There is no evidence of high-grade spinal canal stenosis. Upper chest: The imaged lung apices are clear. Other: None. IMPRESSION: 1. Small volume subarachnoid hemorrhage over the right occipital lobe. 2. Acute mildly depressed fracture of the left orbital floor without evidence of herniation of the inferior rectus muscle. There is associated soft tissue gas in the left orbit and left face extending into the upper neck. No other facial bone fracture is identified. 3. No acute fracture or traumatic malalignment of the cervical spine. 4. Findings suspicious for right vocal cord paralysis which may be postsurgical given surgical clips about the right thyroid lobe. Correlate with history. These results were called by telephone at the time of interpretation on 09/23/2021 at 2:57 pm to provider [facility] , who verbally acknowledged these results. Electronically Signed   By: Valetta Mole M.D.   On: 09/23/2021 14:58   CT Cervical Spine Wo Contrast  Result Date: 09/23/2021 CLINICAL DATA:  Trip and fall with facial injuries EXAM: CT HEAD WITHOUT CONTRAST CT MAXILLOFACIAL WITHOUT CONTRAST CT CERVICAL SPINE WITHOUT CONTRAST TECHNIQUE: Multidetector CT imaging of the head, cervical spine, and maxillofacial structures were performed using the standard protocol without  intravenous contrast. Multiplanar CT image reconstructions of the cervical spine and maxillofacial structures were also generated. RADIATION DOSE REDUCTION: This exam was performed according to the departmental dose-optimization program which includes automated exposure control, adjustment of the mA and/or kV according to patient size and/or use of iterative reconstruction technique. COMPARISON:  Brain MRI 10/06/2013, cervical spine MRI 08/24/2018 FINDINGS: CT HEAD FINDINGS Brain: There is small volume subarachnoid hemorrhage over the right occipital lobe (3-18, 7-20). There is no other acute intracranial hemorrhage or extra-axial collection. There is no evidence of acute territorial infarct. Background parenchymal volume is normal for age. The ventricles are normal in size. No intraventricular hemorrhage is seen. Patchy hypodensity in the supratentorial white matter likely reflects sequela of underlying chronic white matter microangiopathy. There is no mass lesion.  There is no mass effect or midline shift. Vascular: There is calcification of the bilateral cavernous ICAs. Skull: Normal. Negative for fracture or focal lesion. Other: None. CT MAXILLOFACIAL FINDINGS Osseous: There is an acute mildly depressed fracture of the left orbital floor with involvement of the infraorbital nerve canal there is no evidence of entrapment of the inferior rectus muscle. No other acute facial bone fracture is seen. There is no evidence of mandibular dislocation. Orbits: There is gas in the left orbit. The globe is intact. Is no evidence of retrobulbar hematoma. The right globe and orbit are intact. Bilateral lens implants are in place Sinuses: There is layering blood in the left maxillary sinus. There is mild mucosal thickening in the ethmoid air cells. Soft tissues: There is scattered soft tissue gas throughout the left face extending into the upper neck likely related to above-described fracture. CT CERVICAL SPINE FINDINGS  Alignment: Normal. There is no jumped or perched facet or other evidence of traumatic malalignment. Skull base and vertebrae: Skull base alignment is maintained. Vertebral body heights are preserved. There is no evidence of acute fracture. There is partial fusion of the C4 and C5 vertebral bodies posteriorly and ankylosis across the facet joints bilaterally. Soft tissues and spinal canal: No prevertebral fluid or swelling. No visible canal hematoma. There is asymmetric widening  of the right piriform sinus and laryngeal ventricle. There is a subcentimeter calcified left thyroid nodule for which no specific imaging follow-up is required. Surgical clips are noted about the right thyroid lobe. Disc levels: There is disc space narrowing degenerative endplate change most advanced at C4-C5 and C5-C6. Facet arthropathy is most advanced at C3-C4. There is no evidence of high-grade spinal canal stenosis. Upper chest: The imaged lung apices are clear. Other: None. IMPRESSION: 1. Small volume subarachnoid hemorrhage over the right occipital lobe. 2. Acute mildly depressed fracture of the left orbital floor without evidence of herniation of the inferior rectus muscle. There is associated soft tissue gas in the left orbit and left face extending into the upper neck. No other facial bone fracture is identified. 3. No acute fracture or traumatic malalignment of the cervical spine. 4. Findings suspicious for right vocal cord paralysis which may be postsurgical given surgical clips about the right thyroid lobe. Correlate with history. These results were called by telephone at the time of interpretation on 09/23/2021 at 2:57 pm to provider [facility] , who verbally acknowledged these results. Electronically Signed   By: Valetta Mole M.D.   On: 09/23/2021 14:58   DG Knee Complete 4 Views Left  Result Date: 09/23/2021 CLINICAL DATA:  Trauma, fall, pain EXAM: LEFT KNEE - COMPLETE 4+ VIEW COMPARISON:  None Available. FINDINGS: Lateral  view is less than optimal due to positioning limiting the study. As far as seen, no recent fracture or dislocation is noted. Degenerative changes are noted with bony spurs and chondrocalcinosis. Small cystic foci seen in the lateral condyle of the distal left femur may be due to degenerative arthritis. IMPRESSION: No recent fracture or dislocation is seen in left knee. Degenerative changes are noted. Electronically Signed   By: Elmer Picker M.D.   On: 09/23/2021 14:26    Procedures Procedures    Medications Ordered in ED Medications - No data to display  ED Course/ Medical Decision Making/ A&P                           Medical Decision Making Amount and/or Complexity of Data Reviewed Radiology: ordered.  Risk OTC drugs.   This patient presents to the ED for concern of fall, this involves an extensive number of treatment options, and is a complaint that carries with it a high risk of complications and morbidity.   Imaging Studies ordered:  I ordered imaging studies including CT head, max/face, and c spine  I independently visualized and interpreted imaging which showed  1. Small volume subarachnoid hemorrhage over the right occipital lobe. 2. Acute mildly depressed fracture of the left orbital floor without evidence of herniation of the inferior rectus muscle. There is associated soft tissue gas in the left orbit and left face extending into the upper neck. No other facial bone fracture is identified. 3. No acute fracture or traumatic malalignment of the cervical spine. I agree with the radiologist interpretation   Consultations Obtained:  I requested consultation with the neurosurgery, this consult is pending at shift change   Problem List / ED Course / Critical interventions / Medication management  I ordered medication including tylenol  for pain  Reevaluation of the patient after these medicines showed that the patient improved I have reviewed the patients home  medicines and have made adjustments as needed   Test / Admission - Considered:  Patient presents today with complaints of fall and headache. She has  an orbital floor fracture noted on CT imaging, no signs of entrapment on exam. Visual acuity normal. She will need outpatient ophthalmology follow-up for same. Also noted to have small subarachnoid hemorrhage on CT head, she does not have any symptoms from same and is alert and oriented and neurologically intact without focal deficits.  Neurosurgery consult just placed for management of same. This is pending at shift change.   This is a shared visit with supervising physician Dr. Tomi Bamberger who has independently evaluated patient & provided guidance in evaluation/management/disposition, in agreement with care    Care handoff to Alyse Low, PA-C at shift change. Please see their note for further evaluation and dispo.   Final Clinical Impression(s) / ED Diagnoses Final diagnoses:  Fall, initial encounter  Closed fracture of left orbital floor, initial encounter [facility])  Subarachnoid hemorrhage [facility])    Rx / DC Orders ED Discharge Orders     None         Nestor Lewandowsky 09/24/21 Angus Palms, MD 09/28/21 [phone]

## 2021-09-25 ENCOUNTER — Ambulatory Visit: Payer: Medicare PPO | Admitting: Podiatry

## 2021-09-25 DIAGNOSIS — S0003XA Contusion of scalp, initial encounter: Secondary | ICD-10-CM | POA: Diagnosis not present

## 2021-09-25 DIAGNOSIS — L219 Seborrheic dermatitis, unspecified: Secondary | ICD-10-CM | POA: Diagnosis not present

## 2021-09-25 DIAGNOSIS — W19XXXD Unspecified fall, subsequent encounter: Secondary | ICD-10-CM | POA: Diagnosis not present

## 2021-09-30 ENCOUNTER — Encounter: Payer: Self-pay | Admitting: Podiatry

## 2021-09-30 ENCOUNTER — Ambulatory Visit: Payer: Medicare PPO | Admitting: Podiatry

## 2021-09-30 DIAGNOSIS — B351 Tinea unguium: Secondary | ICD-10-CM

## 2021-09-30 DIAGNOSIS — M79676 Pain in unspecified toe(s): Secondary | ICD-10-CM

## 2021-09-30 DIAGNOSIS — E119 Type 2 diabetes mellitus without complications: Secondary | ICD-10-CM

## 2021-09-30 DIAGNOSIS — M129 Arthropathy, unspecified: Secondary | ICD-10-CM

## 2021-09-30 NOTE — Progress Notes (Signed)
This patient returns to my office for at risk foot care.  This patient requires this care by a professional since this patient will be at risk due to having diabetes type 2.  This patient is unable to cut nails herself since the patient cannot reach her nails.These nails are painful walking and wearing shoes.  This patient presents for at risk foot care today.  General Appearance  Alert, conversant and in no acute stress.  Vascular  Dorsalis pedis and posterior tibial  pulses are palpable  bilaterally.  Capillary return is within normal limits  bilaterally. Temperature is within normal limits  bilaterally.  Neurologic  Senn-Weinstein monofilament wire test within normal limits  bilaterally. Muscle power within normal limits bilaterally.  Nails Thick disfigured discolored nails with subungual debris  from hallux to fifth toes bilaterally. No evidence of bacterial infection or drainage bilaterally.  Orthopedic  No limitations of motion  feet .  No crepitus or effusions noted.  No bony pathology or digital deformities noted.  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  Skin  normotropic skin with no porokeratosis noted bilaterally.  No signs of infections or ulcers noted.     Onychomycosis  Pain in right toes  Pain in left toes  Chronic arthrits  Midfoot  B/L.  Consent was obtained for treatment procedures.   Mechanical debridement of nails 1-5  bilaterally performed with a nail nipper.  Filed with dremel without incident.   Return office visit      9 weeks               Told patient to return for periodic foot care and evaluation due to potential at risk complications.   Derricka Mertz DPM  

## 2021-10-01 DIAGNOSIS — M47812 Spondylosis without myelopathy or radiculopathy, cervical region: Secondary | ICD-10-CM | POA: Diagnosis not present

## 2021-10-01 DIAGNOSIS — M542 Cervicalgia: Secondary | ICD-10-CM | POA: Diagnosis not present

## 2021-10-09 DIAGNOSIS — M47812 Spondylosis without myelopathy or radiculopathy, cervical region: Secondary | ICD-10-CM | POA: Diagnosis not present

## 2021-10-09 DIAGNOSIS — M542 Cervicalgia: Secondary | ICD-10-CM | POA: Diagnosis not present

## 2021-10-11 DIAGNOSIS — M542 Cervicalgia: Secondary | ICD-10-CM | POA: Diagnosis not present

## 2021-10-11 DIAGNOSIS — M47812 Spondylosis without myelopathy or radiculopathy, cervical region: Secondary | ICD-10-CM | POA: Diagnosis not present

## 2021-10-14 DIAGNOSIS — M542 Cervicalgia: Secondary | ICD-10-CM | POA: Diagnosis not present

## 2021-10-14 DIAGNOSIS — M47812 Spondylosis without myelopathy or radiculopathy, cervical region: Secondary | ICD-10-CM | POA: Diagnosis not present

## 2021-10-15 ENCOUNTER — Other Ambulatory Visit: Payer: Self-pay | Admitting: Physician Assistant

## 2021-10-15 DIAGNOSIS — B353 Tinea pedis: Secondary | ICD-10-CM

## 2021-10-18 DIAGNOSIS — M542 Cervicalgia: Secondary | ICD-10-CM | POA: Diagnosis not present

## 2021-10-18 DIAGNOSIS — M47812 Spondylosis without myelopathy or radiculopathy, cervical region: Secondary | ICD-10-CM | POA: Diagnosis not present

## 2021-10-21 DIAGNOSIS — M47812 Spondylosis without myelopathy or radiculopathy, cervical region: Secondary | ICD-10-CM | POA: Diagnosis not present

## 2021-10-21 DIAGNOSIS — M542 Cervicalgia: Secondary | ICD-10-CM | POA: Diagnosis not present

## 2021-10-24 DIAGNOSIS — M47812 Spondylosis without myelopathy or radiculopathy, cervical region: Secondary | ICD-10-CM | POA: Diagnosis not present

## 2021-10-24 DIAGNOSIS — M542 Cervicalgia: Secondary | ICD-10-CM | POA: Diagnosis not present

## 2021-10-29 DIAGNOSIS — M47812 Spondylosis without myelopathy or radiculopathy, cervical region: Secondary | ICD-10-CM | POA: Diagnosis not present

## 2021-10-29 DIAGNOSIS — M542 Cervicalgia: Secondary | ICD-10-CM | POA: Diagnosis not present

## 2021-10-31 DIAGNOSIS — M47812 Spondylosis without myelopathy or radiculopathy, cervical region: Secondary | ICD-10-CM | POA: Diagnosis not present

## 2021-10-31 DIAGNOSIS — M542 Cervicalgia: Secondary | ICD-10-CM | POA: Diagnosis not present

## 2021-11-05 ENCOUNTER — Ambulatory Visit: Payer: Medicare PPO | Admitting: Podiatry

## 2021-11-06 DIAGNOSIS — M47812 Spondylosis without myelopathy or radiculopathy, cervical region: Secondary | ICD-10-CM | POA: Diagnosis not present

## 2021-11-14 DIAGNOSIS — D3502 Benign neoplasm of left adrenal gland: Secondary | ICD-10-CM | POA: Diagnosis not present

## 2021-11-14 DIAGNOSIS — E119 Type 2 diabetes mellitus without complications: Secondary | ICD-10-CM | POA: Diagnosis not present

## 2021-11-14 DIAGNOSIS — I10 Essential (primary) hypertension: Secondary | ICD-10-CM | POA: Diagnosis not present

## 2021-11-14 DIAGNOSIS — Z6827 Body mass index (BMI) 27.0-27.9, adult: Secondary | ICD-10-CM | POA: Diagnosis not present

## 2021-11-20 DIAGNOSIS — H6123 Impacted cerumen, bilateral: Secondary | ICD-10-CM | POA: Diagnosis not present

## 2021-11-20 DIAGNOSIS — Z6823 Body mass index (BMI) 23.0-23.9, adult: Secondary | ICD-10-CM | POA: Diagnosis not present

## 2021-11-26 DIAGNOSIS — L219 Seborrheic dermatitis, unspecified: Secondary | ICD-10-CM | POA: Diagnosis not present

## 2021-11-26 DIAGNOSIS — L821 Other seborrheic keratosis: Secondary | ICD-10-CM | POA: Diagnosis not present

## 2021-12-03 ENCOUNTER — Ambulatory Visit: Payer: Medicare PPO | Admitting: Podiatry

## 2021-12-04 ENCOUNTER — Ambulatory Visit: Payer: Medicare PPO | Admitting: Podiatry

## 2021-12-04 ENCOUNTER — Encounter: Payer: Self-pay | Admitting: Podiatry

## 2021-12-04 DIAGNOSIS — M79676 Pain in unspecified toe(s): Secondary | ICD-10-CM | POA: Diagnosis not present

## 2021-12-04 DIAGNOSIS — E119 Type 2 diabetes mellitus without complications: Secondary | ICD-10-CM

## 2021-12-04 DIAGNOSIS — M129 Arthropathy, unspecified: Secondary | ICD-10-CM

## 2021-12-04 DIAGNOSIS — B351 Tinea unguium: Secondary | ICD-10-CM

## 2021-12-04 NOTE — Progress Notes (Signed)
This patient returns to my office for at risk foot care.  This patient requires this care by a professional since this patient will be at risk due to having diabetes type 2.  This patient is unable to cut nails herself since the patient cannot reach her nails.These nails are painful walking and wearing shoes.  This patient presents for at risk foot care today.  General Appearance  Alert, conversant and in no acute stress.  Vascular  Dorsalis pedis and posterior tibial  pulses are palpable  bilaterally.  Capillary return is within normal limits  bilaterally. Temperature is within normal limits  bilaterally.  Neurologic  Senn-Weinstein monofilament wire test within normal limits  bilaterally. Muscle power within normal limits bilaterally.  Nails Thick disfigured discolored nails with subungual debris  from hallux to fifth toes bilaterally. No evidence of bacterial infection or drainage bilaterally.  Orthopedic  No limitations of motion  feet .  No crepitus or effusions noted.  No bony pathology or digital deformities noted.  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  Skin  normotropic skin with no porokeratosis noted bilaterally.  No signs of infections or ulcers noted.     Onychomycosis  Pain in right toes  Pain in left toes  Chronic arthrits  Midfoot  B/L.  Consent was obtained for treatment procedures.   Mechanical debridement of nails 1-5  bilaterally performed with a nail nipper.  Filed with dremel without incident.   Return office visit      9 weeks               Told patient to return for periodic foot care and evaluation due to potential at risk complications.   Miquan Tandon DPM  

## 2021-12-10 DIAGNOSIS — M25512 Pain in left shoulder: Secondary | ICD-10-CM | POA: Diagnosis not present

## 2021-12-11 ENCOUNTER — Other Ambulatory Visit: Payer: Self-pay | Admitting: Orthopaedic Surgery

## 2021-12-11 DIAGNOSIS — Z01818 Encounter for other preprocedural examination: Secondary | ICD-10-CM

## 2021-12-30 ENCOUNTER — Other Ambulatory Visit: Payer: Medicare PPO

## 2021-12-30 ENCOUNTER — Ambulatory Visit
Admission: RE | Admit: 2021-12-30 | Discharge: 2021-12-30 | Disposition: A | Discharge status: Home / Self Care | Payer: Medicare PPO | Source: Ambulatory Visit | Attending: Orthopaedic Surgery | Admitting: Orthopaedic Surgery

## 2021-12-30 DIAGNOSIS — Z96612 Presence of left artificial shoulder joint: Secondary | ICD-10-CM | POA: Diagnosis not present

## 2021-12-30 DIAGNOSIS — Z01818 Encounter for other preprocedural examination: Secondary | ICD-10-CM | POA: Diagnosis not present

## 2021-12-30 DIAGNOSIS — Z471 Aftercare following joint replacement surgery: Secondary | ICD-10-CM | POA: Diagnosis not present

## 2022-01-07 DIAGNOSIS — M25512 Pain in left shoulder: Secondary | ICD-10-CM | POA: Diagnosis not present

## 2022-01-21 DIAGNOSIS — M170 Bilateral primary osteoarthritis of knee: Secondary | ICD-10-CM | POA: Diagnosis not present

## 2022-01-23 ENCOUNTER — Ambulatory Visit: Payer: Medicare PPO | Admitting: Physician Assistant

## 2022-01-28 DIAGNOSIS — M170 Bilateral primary osteoarthritis of knee: Secondary | ICD-10-CM | POA: Diagnosis not present

## 2022-02-03 DIAGNOSIS — M1A9XX1 Chronic gout, unspecified, with tophus (tophi): Secondary | ICD-10-CM | POA: Diagnosis not present

## 2022-02-03 DIAGNOSIS — Z6823 Body mass index (BMI) 23.0-23.9, adult: Secondary | ICD-10-CM | POA: Diagnosis not present

## 2022-02-03 DIAGNOSIS — E790 Hyperuricemia without signs of inflammatory arthritis and tophaceous disease: Secondary | ICD-10-CM | POA: Diagnosis not present

## 2022-02-03 DIAGNOSIS — M79641 Pain in right hand: Secondary | ICD-10-CM | POA: Diagnosis not present

## 2022-02-03 DIAGNOSIS — M79642 Pain in left hand: Secondary | ICD-10-CM | POA: Diagnosis not present

## 2022-02-03 DIAGNOSIS — M1991 Primary osteoarthritis, unspecified site: Secondary | ICD-10-CM | POA: Diagnosis not present

## 2022-02-03 DIAGNOSIS — R768 Other specified abnormal immunological findings in serum: Secondary | ICD-10-CM | POA: Diagnosis not present

## 2022-02-04 DIAGNOSIS — D1801 Hemangioma of skin and subcutaneous tissue: Secondary | ICD-10-CM | POA: Diagnosis not present

## 2022-02-04 DIAGNOSIS — M170 Bilateral primary osteoarthritis of knee: Secondary | ICD-10-CM | POA: Diagnosis not present

## 2022-02-04 DIAGNOSIS — L578 Other skin changes due to chronic exposure to nonionizing radiation: Secondary | ICD-10-CM | POA: Diagnosis not present

## 2022-02-04 DIAGNOSIS — L309 Dermatitis, unspecified: Secondary | ICD-10-CM | POA: Diagnosis not present

## 2022-02-04 DIAGNOSIS — L821 Other seborrheic keratosis: Secondary | ICD-10-CM | POA: Diagnosis not present

## 2022-02-04 DIAGNOSIS — L814 Other melanin hyperpigmentation: Secondary | ICD-10-CM | POA: Diagnosis not present

## 2022-02-07 ENCOUNTER — Ambulatory Visit: Payer: Medicare PPO | Admitting: Podiatry

## 2022-02-07 ENCOUNTER — Encounter: Payer: Self-pay | Admitting: Podiatry

## 2022-02-07 VITALS — BP 168/75

## 2022-02-07 DIAGNOSIS — E119 Type 2 diabetes mellitus without complications: Secondary | ICD-10-CM | POA: Diagnosis not present

## 2022-02-07 DIAGNOSIS — B351 Tinea unguium: Secondary | ICD-10-CM

## 2022-02-07 DIAGNOSIS — M79676 Pain in unspecified toe(s): Secondary | ICD-10-CM

## 2022-02-07 DIAGNOSIS — M129 Arthropathy, unspecified: Secondary | ICD-10-CM

## 2022-02-07 NOTE — Progress Notes (Signed)
This patient returns to my office for at risk foot care.  This patient requires this care by a professional since this patient will be at risk due to having diabetes type 2.  This patient is unable to cut nails herself since the patient cannot reach her nails.These nails are painful walking and wearing shoes.  This patient presents for at risk foot care today.  General Appearance  Alert, conversant and in no acute stress.  Vascular  Dorsalis pedis and posterior tibial  pulses are palpable  bilaterally.  Capillary return is within normal limits  bilaterally. Temperature is within normal limits  bilaterally.  Neurologic  Senn-Weinstein monofilament wire test within normal limits  bilaterally. Muscle power within normal limits bilaterally.  Nails Thick disfigured discolored nails with subungual debris  from hallux to fifth toes bilaterally. No evidence of bacterial infection or drainage bilaterally.  Orthopedic  No limitations of motion  feet .  No crepitus or effusions noted.  No bony pathology or digital deformities noted.  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  Skin  normotropic skin with no porokeratosis noted bilaterally.  No signs of infections or ulcers noted.     Onychomycosis  Pain in right toes  Pain in left toes  Chronic arthrits  Midfoot  B/L.  Consent was obtained for treatment procedures.   Mechanical debridement of nails 1-5  bilaterally performed with a nail nipper.  Filed with dremel without incident.   Return office visit      9 weeks               Told patient to return for periodic foot care and evaluation due to potential at risk complications.   Gardiner Barefoot DPM

## 2022-02-21 DIAGNOSIS — Z0000 Encounter for general adult medical examination without abnormal findings: Secondary | ICD-10-CM | POA: Diagnosis not present

## 2022-02-21 DIAGNOSIS — Z1389 Encounter for screening for other disorder: Secondary | ICD-10-CM | POA: Diagnosis not present

## 2022-02-21 DIAGNOSIS — Z6823 Body mass index (BMI) 23.0-23.9, adult: Secondary | ICD-10-CM | POA: Diagnosis not present

## 2022-02-24 DIAGNOSIS — I10 Essential (primary) hypertension: Secondary | ICD-10-CM | POA: Diagnosis not present

## 2022-02-24 DIAGNOSIS — Z23 Encounter for immunization: Secondary | ICD-10-CM | POA: Diagnosis not present

## 2022-02-24 DIAGNOSIS — M109 Gout, unspecified: Secondary | ICD-10-CM | POA: Diagnosis not present

## 2022-02-24 DIAGNOSIS — M810 Age-related osteoporosis without current pathological fracture: Secondary | ICD-10-CM | POA: Diagnosis not present

## 2022-02-24 DIAGNOSIS — R7303 Prediabetes: Secondary | ICD-10-CM | POA: Diagnosis not present

## 2022-02-24 DIAGNOSIS — E7800 Pure hypercholesterolemia, unspecified: Secondary | ICD-10-CM | POA: Diagnosis not present

## 2022-02-24 DIAGNOSIS — Z6823 Body mass index (BMI) 23.0-23.9, adult: Secondary | ICD-10-CM | POA: Diagnosis not present

## 2022-02-24 DIAGNOSIS — M179 Osteoarthritis of knee, unspecified: Secondary | ICD-10-CM | POA: Diagnosis not present

## 2022-04-04 DIAGNOSIS — Z961 Presence of intraocular lens: Secondary | ICD-10-CM | POA: Diagnosis not present

## 2022-04-09 DIAGNOSIS — E790 Hyperuricemia without signs of inflammatory arthritis and tophaceous disease: Secondary | ICD-10-CM | POA: Diagnosis not present

## 2022-04-17 ENCOUNTER — Ambulatory Visit: Payer: Medicare PPO | Admitting: Podiatry

## 2022-04-17 ENCOUNTER — Encounter: Payer: Self-pay | Admitting: Podiatry

## 2022-04-17 VITALS — BP 119/58 | HR 59

## 2022-04-17 DIAGNOSIS — B351 Tinea unguium: Secondary | ICD-10-CM

## 2022-04-17 DIAGNOSIS — E119 Type 2 diabetes mellitus without complications: Secondary | ICD-10-CM

## 2022-04-17 DIAGNOSIS — M79676 Pain in unspecified toe(s): Secondary | ICD-10-CM

## 2022-04-17 NOTE — Progress Notes (Signed)
This patient returns to my office for at risk foot care.  This patient requires this care by a professional since this patient will be at risk due to having diabetes type 2.  This patient is unable to cut nails herself since the patient cannot reach her nails.These nails are painful walking and wearing shoes.  This patient presents for at risk foot care today.  General Appearance  Alert, conversant and in no acute stress.  Vascular  Dorsalis pedis and posterior tibial  pulses are palpable  bilaterally.  Capillary return is within normal limits  bilaterally. Temperature is within normal limits  bilaterally.  Neurologic  Senn-Weinstein monofilament wire test within normal limits  bilaterally. Muscle power within normal limits bilaterally.  Nails Thick disfigured discolored nails with subungual debris  from hallux to fifth toes bilaterally. No evidence of bacterial infection or drainage bilaterally.  Orthopedic  No limitations of motion  feet .  No crepitus or effusions noted.  No bony pathology or digital deformities noted.  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  Skin  normotropic skin with no porokeratosis noted bilaterally.  No signs of infections or ulcers noted.     Onychomycosis  Pain in right toes  Pain in left toes  Chronic arthrits  Midfoot  B/L.  Consent was obtained for treatment procedures.   Mechanical debridement of nails 1-5  bilaterally performed with a nail nipper.  Filed with dremel without incident.   Return office visit      9 weeks               Told patient to return for periodic foot care and evaluation due to potential at risk complications.   Gardiner Barefoot DPM

## 2022-04-25 ENCOUNTER — Emergency Department (HOSPITAL_COMMUNITY): Payer: Medicare PPO

## 2022-04-25 ENCOUNTER — Inpatient Hospital Stay (HOSPITAL_COMMUNITY)
Admission: EM | Admit: 2022-04-25 | Discharge: 2022-04-30 | DRG: 483 | Disposition: A | Discharge status: Skilled Nursing Facility | Payer: Medicare PPO | Attending: Orthopaedic Surgery | Admitting: Orthopaedic Surgery

## 2022-04-25 ENCOUNTER — Other Ambulatory Visit: Payer: Self-pay

## 2022-04-25 ENCOUNTER — Encounter (HOSPITAL_COMMUNITY): Payer: Self-pay

## 2022-04-25 DIAGNOSIS — W19XXXD Unspecified fall, subsequent encounter: Secondary | ICD-10-CM | POA: Diagnosis not present

## 2022-04-25 DIAGNOSIS — S0093XA Contusion of unspecified part of head, initial encounter: Secondary | ICD-10-CM

## 2022-04-25 DIAGNOSIS — M810 Age-related osteoporosis without current pathological fracture: Secondary | ICD-10-CM | POA: Diagnosis present

## 2022-04-25 DIAGNOSIS — Z886 Allergy status to analgesic agent status: Secondary | ICD-10-CM

## 2022-04-25 DIAGNOSIS — S42301A Unspecified fracture of shaft of humerus, right arm, initial encounter for closed fracture: Secondary | ICD-10-CM

## 2022-04-25 DIAGNOSIS — W19XXXA Unspecified fall, initial encounter: Secondary | ICD-10-CM | POA: Diagnosis not present

## 2022-04-25 DIAGNOSIS — K219 Gastro-esophageal reflux disease without esophagitis: Secondary | ICD-10-CM | POA: Diagnosis not present

## 2022-04-25 DIAGNOSIS — I10 Essential (primary) hypertension: Secondary | ICD-10-CM | POA: Diagnosis not present

## 2022-04-25 DIAGNOSIS — M19011 Primary osteoarthritis, right shoulder: Secondary | ICD-10-CM | POA: Diagnosis present

## 2022-04-25 DIAGNOSIS — S0181XA Laceration without foreign body of other part of head, initial encounter: Secondary | ICD-10-CM | POA: Diagnosis present

## 2022-04-25 DIAGNOSIS — Z882 Allergy status to sulfonamides status: Secondary | ICD-10-CM | POA: Diagnosis not present

## 2022-04-25 DIAGNOSIS — Z87891 Personal history of nicotine dependence: Secondary | ICD-10-CM | POA: Diagnosis not present

## 2022-04-25 DIAGNOSIS — N182 Chronic kidney disease, stage 2 (mild): Secondary | ICD-10-CM | POA: Diagnosis present

## 2022-04-25 DIAGNOSIS — Z96612 Presence of left artificial shoulder joint: Secondary | ICD-10-CM | POA: Diagnosis present

## 2022-04-25 DIAGNOSIS — R58 Hemorrhage, not elsewhere classified: Secondary | ICD-10-CM | POA: Diagnosis not present

## 2022-04-25 DIAGNOSIS — S42309A Unspecified fracture of shaft of humerus, unspecified arm, initial encounter for closed fracture: Secondary | ICD-10-CM

## 2022-04-25 DIAGNOSIS — Z803 Family history of malignant neoplasm of breast: Secondary | ICD-10-CM

## 2022-04-25 DIAGNOSIS — S42201A Unspecified fracture of upper end of right humerus, initial encounter for closed fracture: Secondary | ICD-10-CM | POA: Diagnosis not present

## 2022-04-25 DIAGNOSIS — Z8249 Family history of ischemic heart disease and other diseases of the circulatory system: Secondary | ICD-10-CM

## 2022-04-25 DIAGNOSIS — S42351A Displaced comminuted fracture of shaft of humerus, right arm, initial encounter for closed fracture: Secondary | ICD-10-CM | POA: Diagnosis not present

## 2022-04-25 DIAGNOSIS — E785 Hyperlipidemia, unspecified: Secondary | ICD-10-CM | POA: Diagnosis not present

## 2022-04-25 DIAGNOSIS — E1122 Type 2 diabetes mellitus with diabetic chronic kidney disease: Secondary | ICD-10-CM | POA: Diagnosis not present

## 2022-04-25 DIAGNOSIS — W0110XA Fall on same level from slipping, tripping and stumbling with subsequent striking against unspecified object, initial encounter: Secondary | ICD-10-CM | POA: Diagnosis present

## 2022-04-25 DIAGNOSIS — Z4789 Encounter for other orthopedic aftercare: Secondary | ICD-10-CM | POA: Diagnosis not present

## 2022-04-25 DIAGNOSIS — E210 Primary hyperparathyroidism: Secondary | ICD-10-CM | POA: Diagnosis present

## 2022-04-25 DIAGNOSIS — Z888 Allergy status to other drugs, medicaments and biological substances status: Secondary | ICD-10-CM

## 2022-04-25 DIAGNOSIS — I129 Hypertensive chronic kidney disease with stage 1 through stage 4 chronic kidney disease, or unspecified chronic kidney disease: Secondary | ICD-10-CM | POA: Diagnosis not present

## 2022-04-25 DIAGNOSIS — S42121A Displaced fracture of acromial process, right shoulder, initial encounter for closed fracture: Secondary | ICD-10-CM | POA: Diagnosis not present

## 2022-04-25 DIAGNOSIS — R1311 Dysphagia, oral phase: Secondary | ICD-10-CM | POA: Diagnosis not present

## 2022-04-25 DIAGNOSIS — D3500 Benign neoplasm of unspecified adrenal gland: Secondary | ICD-10-CM | POA: Diagnosis not present

## 2022-04-25 DIAGNOSIS — Z7401 Bed confinement status: Secondary | ICD-10-CM | POA: Diagnosis not present

## 2022-04-25 DIAGNOSIS — E119 Type 2 diabetes mellitus without complications: Secondary | ICD-10-CM | POA: Diagnosis not present

## 2022-04-25 DIAGNOSIS — S42301D Unspecified fracture of shaft of humerus, right arm, subsequent encounter for fracture with routine healing: Secondary | ICD-10-CM | POA: Diagnosis not present

## 2022-04-25 DIAGNOSIS — Y92019 Unspecified place in single-family (private) house as the place of occurrence of the external cause: Secondary | ICD-10-CM

## 2022-04-25 DIAGNOSIS — R001 Bradycardia, unspecified: Secondary | ICD-10-CM | POA: Diagnosis not present

## 2022-04-25 DIAGNOSIS — S42291A Other displaced fracture of upper end of right humerus, initial encounter for closed fracture: Secondary | ICD-10-CM | POA: Diagnosis not present

## 2022-04-25 DIAGNOSIS — W010XXA Fall on same level from slipping, tripping and stumbling without subsequent striking against object, initial encounter: Principal | ICD-10-CM

## 2022-04-25 DIAGNOSIS — S4981XA Other specified injuries of right shoulder and upper arm, initial encounter: Secondary | ICD-10-CM | POA: Diagnosis not present

## 2022-04-25 DIAGNOSIS — S0990XA Unspecified injury of head, initial encounter: Secondary | ICD-10-CM | POA: Diagnosis not present

## 2022-04-25 DIAGNOSIS — M25519 Pain in unspecified shoulder: Secondary | ICD-10-CM | POA: Diagnosis not present

## 2022-04-25 DIAGNOSIS — Z96611 Presence of right artificial shoulder joint: Secondary | ICD-10-CM | POA: Diagnosis not present

## 2022-04-25 DIAGNOSIS — G8918 Other acute postprocedural pain: Secondary | ICD-10-CM | POA: Diagnosis not present

## 2022-04-25 DIAGNOSIS — I700 Atherosclerosis of aorta: Secondary | ICD-10-CM | POA: Diagnosis not present

## 2022-04-25 DIAGNOSIS — Z471 Aftercare following joint replacement surgery: Secondary | ICD-10-CM | POA: Diagnosis not present

## 2022-04-25 HISTORY — DX: Unspecified fracture of shaft of humerus, right arm, initial encounter for closed fracture: S42.301A

## 2022-04-25 HISTORY — DX: Unspecified fracture of shaft of humerus, unspecified arm, initial encounter for closed fracture: S42.309A

## 2022-04-25 LAB — BASIC METABOLIC PANEL
Anion gap: 10 (ref 5–15)
BUN: 36 mg/dL — ABNORMAL HIGH (ref 8–23)
CO2: 23 mmol/L (ref 22–32)
Calcium: 9.4 mg/dL (ref 8.9–10.3)
Chloride: 102 mmol/L (ref 98–111)
Creatinine, Ser: 1.25 mg/dL — ABNORMAL HIGH (ref 0.44–1.00)
GFR, Estimated: 43 mL/min — ABNORMAL LOW (ref >60)
Glucose, Bld: 152 mg/dL — ABNORMAL HIGH (ref 70–99)
Potassium: 4.1 mmol/L (ref 3.5–5.1)
Sodium: 135 mmol/L (ref 135–145)

## 2022-04-25 LAB — CBC
HCT: 38.2 % (ref 36.0–46.0)
Hemoglobin: 12.3 g/dL (ref 12.0–15.0)
MCH: 30.4 pg (ref 26.0–34.0)
MCHC: 32.2 g/dL (ref 30.0–36.0)
MCV: 94.3 fL (ref 80.0–100.0)
Platelets: 199 10*3/uL (ref 150–400)
RBC: 4.05 MIL/uL (ref 3.87–5.11)
RDW: 14.7 % (ref 11.5–15.5)
WBC: 9.6 10*3/uL (ref 4.0–10.5)
nRBC: 0 % (ref 0.0–0.2)

## 2022-04-25 MED ORDER — HYDRALAZINE HCL 25 MG PO TABS
25.0000 mg | ORAL_TABLET | Freq: Two times a day (BID) | ORAL | Status: DC
  Administered 2022-04-25 – 2022-04-30 (×6): 25 mg via ORAL
  Filled 2022-04-25 (×7): qty 1

## 2022-04-25 MED ORDER — ONDANSETRON HCL 4 MG/2ML IJ SOLN: 4.0000 mg | Freq: Four times a day (QID) | INTRAMUSCULAR | Status: DC | PRN

## 2022-04-25 MED ORDER — HYDROMORPHONE HCL 1 MG/ML IJ SOLN
0.5000 mg | Freq: Once | INTRAMUSCULAR | Status: AC
  Administered 2022-04-25: 0.5 mg via INTRAVENOUS
  Filled 2022-04-25: qty 1

## 2022-04-25 MED ORDER — MORPHINE SULFATE (PF) 2 MG/ML IV SOLN: 0.5000 mg | INTRAVENOUS | Status: DC | PRN

## 2022-04-25 MED ORDER — TRAMADOL HCL 50 MG PO TABS: 50.0000 mg | ORAL_TABLET | Freq: Four times a day (QID) | ORAL | 0 refills | Status: DC | PRN

## 2022-04-25 MED ORDER — SENNOSIDES-DOCUSATE SODIUM 8.6-50 MG PO TABS: 1.0000 | ORAL_TABLET | Freq: Every evening | ORAL | Status: DC | PRN

## 2022-04-25 MED ORDER — HYDROCODONE-ACETAMINOPHEN 7.5-325 MG PO TABS: 1.0000 | ORAL_TABLET | ORAL | Status: DC | PRN

## 2022-04-25 MED ORDER — PANTOPRAZOLE SODIUM 40 MG PO TBEC: 40.0000 mg | DELAYED_RELEASE_TABLET | ORAL | Status: DC

## 2022-04-25 MED ORDER — CARVEDILOL 12.5 MG PO TABS
12.5000 mg | ORAL_TABLET | Freq: Two times a day (BID) | ORAL | Status: DC
  Administered 2022-04-26 – 2022-04-30 (×7): 12.5 mg via ORAL
  Filled 2022-04-25 (×7): qty 1

## 2022-04-25 MED ORDER — ONDANSETRON HCL 4 MG/2ML IJ SOLN
4.0000 mg | Freq: Once | INTRAMUSCULAR | Status: AC
  Administered 2022-04-25: 4 mg via INTRAVENOUS
  Filled 2022-04-25: qty 2

## 2022-04-25 MED ORDER — IRBESARTAN 150 MG PO TABS
150.0000 mg | ORAL_TABLET | Freq: Every day | ORAL | Status: DC
  Administered 2022-04-26 – 2022-04-30 (×2): 150 mg via ORAL
  Filled 2022-04-25 (×2): qty 1

## 2022-04-25 MED ORDER — FUROSEMIDE 40 MG PO TABS
40.0000 mg | ORAL_TABLET | Freq: Two times a day (BID) | ORAL | Status: DC
  Administered 2022-04-26 – 2022-04-28 (×4): 40 mg via ORAL
  Filled 2022-04-25 (×4): qty 1

## 2022-04-25 MED ORDER — HYDRALAZINE HCL 25 MG PO TABS: 50.0000 mg | ORAL_TABLET | Freq: Two times a day (BID) | ORAL | Status: DC

## 2022-04-25 MED ORDER — TRAMADOL HCL 50 MG PO TABS
50.0000 mg | ORAL_TABLET | Freq: Once | ORAL | Status: AC
  Administered 2022-04-25: 50 mg via ORAL
  Filled 2022-04-25: qty 1

## 2022-04-25 MED ORDER — METHOCARBAMOL 1000 MG/10ML IJ SOLN: 500.0000 mg | Freq: Four times a day (QID) | INTRAVENOUS | Status: DC | PRN

## 2022-04-25 MED ORDER — ACETAMINOPHEN 325 MG PO TABS: 325.0000 mg | ORAL_TABLET | Freq: Four times a day (QID) | ORAL | Status: DC | PRN

## 2022-04-25 MED ORDER — PANTOPRAZOLE SODIUM 40 MG PO TBEC
40.0000 mg | DELAYED_RELEASE_TABLET | Freq: Two times a day (BID) | ORAL | Status: DC
  Administered 2022-04-26 – 2022-04-30 (×8): 40 mg via ORAL
  Filled 2022-04-25 (×8): qty 1

## 2022-04-25 MED ORDER — LIDOCAINE-EPINEPHRINE (PF) 2 %-1:200000 IJ SOLN
20.0000 mL | Freq: Once | INTRAMUSCULAR | Status: AC
  Administered 2022-04-25: 20 mL
  Filled 2022-04-25: qty 20

## 2022-04-25 MED ORDER — HYDROCODONE-ACETAMINOPHEN 5-325 MG PO TABS
1.0000 | ORAL_TABLET | ORAL | Status: DC | PRN
  Administered 2022-04-26: 2 via ORAL
  Administered 2022-04-27: 1 via ORAL
  Filled 2022-04-25: qty 2
  Filled 2022-04-25: qty 1

## 2022-04-25 MED ORDER — ONDANSETRON HCL 4 MG PO TABS: 4.0000 mg | ORAL_TABLET | Freq: Four times a day (QID) | ORAL | Status: DC | PRN

## 2022-04-25 MED ORDER — METHOCARBAMOL 500 MG PO TABS: 500.0000 mg | ORAL_TABLET | Freq: Four times a day (QID) | ORAL | Status: DC | PRN

## 2022-04-25 MED ORDER — PREDNISOLONE ACETATE 1 % OP SUSP: 1.0000 [drp] | Freq: Four times a day (QID) | OPHTHALMIC | Status: DC

## 2022-04-25 MED ORDER — GATIFLOXACIN 0.5 % OP SOLN: 1.0000 [drp] | Freq: Four times a day (QID) | OPHTHALMIC | Status: DC

## 2022-04-25 MED ORDER — CLONIDINE HCL 0.1 MG PO TABS: 0.1000 mg | ORAL_TABLET | Freq: Every day | ORAL | Status: DC | PRN

## 2022-04-25 MED ORDER — DIPHENHYDRAMINE HCL 12.5 MG/5ML PO ELIX: 12.5000 mg | ORAL_SOLUTION | ORAL | Status: DC | PRN

## 2022-04-25 MED ORDER — ACETAMINOPHEN 500 MG PO TABS
500.0000 mg | ORAL_TABLET | Freq: Four times a day (QID) | ORAL | Status: AC
  Administered 2022-04-25 – 2022-04-26 (×4): 500 mg via ORAL
  Filled 2022-04-25 (×4): qty 1

## 2022-04-25 MED ORDER — SUCRALFATE 1 G PO TABS: 1.0000 g | ORAL_TABLET | Freq: Three times a day (TID) | ORAL | Status: DC

## 2022-04-25 MED ORDER — DOCUSATE SODIUM 100 MG PO CAPS
100.0000 mg | ORAL_CAPSULE | Freq: Two times a day (BID) | ORAL | Status: DC
  Administered 2022-04-25 – 2022-04-26 (×3): 100 mg via ORAL
  Filled 2022-04-25 (×3): qty 1

## 2022-04-25 MED ORDER — ATORVASTATIN CALCIUM 10 MG PO TABS
10.0000 mg | ORAL_TABLET | Freq: Every day | ORAL | Status: DC
  Administered 2022-04-25 – 2022-04-29 (×5): 10 mg via ORAL
  Filled 2022-04-25 (×5): qty 1

## 2022-04-25 MED ORDER — FAMOTIDINE 20 MG PO TABS: 20.0000 mg | ORAL_TABLET | Freq: Every day | ORAL | Status: DC

## 2022-04-25 MED ORDER — ALLOPURINOL 300 MG PO TABS
300.0000 mg | ORAL_TABLET | Freq: Every day | ORAL | Status: DC
  Administered 2022-04-26 – 2022-04-30 (×3): 300 mg via ORAL
  Filled 2022-04-25 (×3): qty 1

## 2022-04-25 NOTE — Discharge Instructions (Addendum)
It was our pleasure to provide your ER care today - we hope that you feel better.  Use shoulder sling/immobilizer for comfort/support. Icepack/cold to sore area.   Take acetaminophen or ibuprofen as need for pain. You may also take ultram as need for pain.  Fall precautions.    Have sutures removed, your doctor or urgent care, in 7-10 days.   For fractured humerus, follow up with your orthopedist this coming week - call office to arrange appointment.   Return to ER if worse, new symptoms, new/severe or intractable pain, numbness/weakness, infection of wound, or other concern.

## 2022-04-25 NOTE — Progress Notes (Signed)
Orthopedic Tech Progress Note Patient Details:  Tina Frank 26-Sep-1940 [id number] Well-padded coaptation splint was applied with assistance from an RN who held the arm while I splinted.  Ortho Devices Type of Ortho Device: Coapt, Arm sling Ortho Device/Splint Location: Right arm Ortho Device/Splint Interventions: Application   Post Interventions Patient Tolerated: Well  Tina Frank 04/25/2022, 8:17 PM

## 2022-04-25 NOTE — ED Provider Notes (Signed)
5:50 PM  Care assumed from Dr. Helmut Muster.  At time of transfer of care, patient is awaiting results of CT imaging as ordered by orthopedics.  Plan of care is to follow-up on the imaging and await orthopedic recommendations for either discharge with outpatient plan versus admit for surgery.  7:48 PM Patient had a CT and Dr. Griffin Basil think she needs surgery.  He will plan to operate on Sunday so she can eat now.  He requested coaptation splint and sling.  He will admit to orthopedics for further management.   Clinical Impression: 1. Fall from slip, trip, or stumble, initial encounter   2. Closed fracture of proximal end of right humerus, unspecified fracture morphology, initial encounter   3. Contusion of head, initial encounter   4. Laceration of forehead, initial encounter     Disposition: Admit  This note was prepared with assistance of Dragon voice recognition software. Occasional wrong-word or sound-a-like substitutions may have occurred due to the inherent limitations of voice recognition software.     Syona Wroblewski, Gwenyth Allegra, MD 04/25/22 [phone]

## 2022-04-25 NOTE — ED Provider Notes (Signed)
[facility]PARTMENT AT [facility] Provider Note   CSN: [id number] Arrival date & time: 04/25/22  1239     History  Chief Complaint  Patient presents with   Shoulder Injury    Tina Frank is a 82 y.o. female.  Patient s/p fall with contusion/lac to head and c/o right shoulder pain. States was trying to change her clothes/pants and feels likely was mechanical fall. Hit head. No loc. Pain to area, left forehead/eyebrow, lac to area. Tetanus is up to date. No preceding headache. Denies neck or back pain. C/o right shoulder pain post fall, constant, dull, mod-severe. No extremity numbness/weakness. No chest pain or sob. No abd pain or nv. No anticoagulant use.   The history is provided by the patient, medical records, a relative and the EMS personnel.  Shoulder Injury Pertinent negatives include no chest pain, no abdominal pain, no headaches and no shortness of breath.       Home Medications Prior to Admission medications   Medication Sig Start Date End Date Taking? Authorizing Provider  acetaminophen (TYLENOL) 500 MG tablet 2 tablets as needed    [provider]  allopurinol (ZYLOPRIM) 300 MG tablet Take 300 mg by mouth daily.    [provider]  amoxicillin (AMOXIL) 500 MG capsule SMARTSIG:4 Capsule(s) By Mouth Once 03/22/20   [provider]  aspirin EC 81 MG tablet Take 81 mg by mouth daily.    [provider]  atorvastatin (LIPITOR) 10 MG tablet Take 10 mg by mouth at bedtime.     [provider]  calcium carbonate (TUMS - DOSED IN MG ELEMENTAL CALCIUM) 500 MG chewable tablet Chew 1 tablet by mouth 3 (three) times daily as needed for indigestion or heartburn.    [provider]  carvedilol (COREG) 12.5 MG tablet Take 12.5 mg by mouth 2 (two) times daily with a meal.  12/04/14   [provider]  cloNIDine (CATAPRES) 0.1 MG tablet Take 0.1 mg by mouth daily as needed (if systolic bp is over  0000000).    [provider]  denosumab (PROLIA) 60 MG/ML SOSY injection Inject 60 mg into the skin every 6 (six) months. 03/16/18   Campbell Riches, MD  Ergocalciferol (VITAMIN D2) 50 MCG (2000 UT) TABS 1 tablet 07/22/19   [provider]  famotidine (PEPCID) 20 MG tablet Take by mouth. 02/19/21   [provider]  furosemide (LASIX) 40 MG tablet Take 40 mg by mouth 2 (two) times daily.  08/15/17   [provider]  hydrALAZINE (APRESOLINE) 50 MG tablet Take 50 mg by mouth 2 (two) times daily.  12/04/14   [provider]  ketoconazole (NIZORAL) 2 % cream APPLY TOPICALLY TO FEET EVERY NIGHT 10/15/21   Sheffield, Vida Roller R, PA-C  ketorolac (ACULAR) 0.5 % ophthalmic solution 1 drop 4 (four) times daily. 05/05/20   [provider]  meloxicam (MOBIC) 15 MG tablet  11/03/19   [provider]  meloxicam (MOBIC) 7.5 MG tablet Take 7.5 mg by mouth 2 (two) times daily as needed. 04/20/20   [provider]  moxifloxacin (VIGAMOX) 0.5 % ophthalmic solution Apply 1 drop to eye 4 (four) times daily. 05/05/20   [provider]  ondansetron (ZOFRAN ODT) 4 MG disintegrating tablet 27m ODT q4 hours prn nausea/vomit 06/21/20   YDrenda Freeze MD  pantoprazole (PROTONIX) 40 MG tablet Take 40 mg by mouth See admin instructions. Take 40 mg once daily, may take a second 40  mg dose as needed for acid reflux 08/23/18   [provider]  pantoprazole (PROTONIX) 40 MG tablet 1 tablet    [provider]  prednisoLONE acetate (PRED FORTE) 1 % ophthalmic suspension 1 drop 4 (four) times daily. 05/05/20   [provider]  sucralfate (CARAFATE) 1 g tablet Take 1 tablet (1 g total) by mouth 4 (four) times daily -  with meals and at bedtime. 06/21/20   Drenda Freeze, MD  telmisartan (MICARDIS) 80 MG tablet Take 0.5 tablets (40 mg total) by mouth daily. Patient taking differently: Take 40 mg by mouth 2 (two) times daily. 10/07/13   Kelvin Cellar, MD  traMADol (ULTRAM) 50 MG tablet Take 1 tablet (50 mg total) by mouth every 6 (six) hours as needed. 09/28/19   Brien Few, MD      Allergies    Abaloparatide, Meloxicam, Crestor [rosuvastatin], Norvasc [amlodipine besylate], Simvastatin, Sulfa antibiotics, and Zetia [ezetimibe]    Review of Systems   Review of Systems  Constitutional:  Negative for fever.  HENT:  Negative for nosebleeds.   Eyes:  Negative for redness.  Respiratory:  Negative for cough and shortness of breath.   Cardiovascular:  Negative for chest pain and leg swelling.  Gastrointestinal:  Negative for abdominal pain, nausea and vomiting.  Genitourinary:  Negative for dysuria and flank pain.  Musculoskeletal:  Negative for back pain and neck pain.  Skin:  Positive for wound.  Neurological:  Negative for weakness, numbness and headaches.  Hematological:  Does not bruise/bleed easily.  Psychiatric/Behavioral:  Negative for confusion.     Physical Exam Updated Vital Signs BP (!) 156/65   Pulse (!) 53   Temp 97.7 F (36.5 C) (Oral)   Resp 13   Ht 1.651 m (5' 5"$ )   Wt 63.5 kg   SpO2 96%   BMI 23.30 kg/m  Physical Exam Vitals and nursing note reviewed.  Constitutional:      Appearance: Normal appearance. She is well-developed.  HENT:     Head:     Comments: Contusion/lac to left eyebrow/forehead area.     Nose: Nose normal.     Mouth/Throat:     Mouth: Mucous membranes are moist.  Eyes:     General: No scleral icterus.    Conjunctiva/sclera: Conjunctivae normal.     Pupils: Pupils are equal, round, and reactive to light.  Neck:     Trachea: No tracheal deviation.  Cardiovascular:     Rate and Rhythm: Normal rate and regular rhythm.     Pulses: Normal pulses.     Heart sounds: Normal heart sounds. No murmur heard.    No friction rub. No gallop.  Pulmonary:     Effort: Pulmonary effort is normal. No respiratory distress.     Breath sounds: Normal breath sounds.  Abdominal:      General: Bowel sounds are normal. There is no distension.     Palpations: Abdomen is soft. There is no mass.     Tenderness: There is no abdominal tenderness.  Genitourinary:    Comments: No cva tenderness. Musculoskeletal:        General: No swelling.     Cervical back: Normal range of motion and neck supple. No rigidity. No muscular tenderness.     Comments: CTLS spine, non tender, aligned, no step off. Mild sts and tenderness to right upper arm/shoulder, otherwise good rom bil extremities without pain or other focal bony tenderness. Distal pulses palp bil.   Skin:  General: Skin is warm and dry.     Findings: No rash.  Neurological:     Mental Status: She is alert.     Comments: Alert, speech normal. GCS 15. Motor/sens grossly intact bil. RUE nvi, w intact motor/sens.   Psychiatric:        Mood and Affect: Mood normal.     ED Results / Procedures / Treatments   Labs (all labs ordered are listed, but only abnormal results are displayed) Results for orders placed or performed during the hospital encounter of 04/25/22  CBC  Result Value Ref Range   WBC 9.6 4.0 - 10.5 K/uL   RBC 4.05 3.87 - 5.11 MIL/uL   Hemoglobin 12.3 12.0 - 15.0 g/dL   HCT 38.2 36.0 - 46.0 %   MCV 94.3 80.0 - 100.0 fL   MCH 30.4 26.0 - 34.0 pg   MCHC 32.2 30.0 - 36.0 g/dL   RDW 14.7 11.5 - 15.5 %   Platelets 199 150 - 400 K/uL   nRBC 0.0 0.0 - 0.2 %  Basic metabolic panel  Result Value Ref Range   Sodium 135 135 - 145 mmol/L   Potassium 4.1 3.5 - 5.1 mmol/L   Chloride 102 98 - 111 mmol/L   CO2 23 22 - 32 mmol/L   Glucose, Bld 152 (H) 70 - 99 mg/dL   BUN 36 (H) 8 - 23 mg/dL   Creatinine, Ser 1.25 (H) 0.44 - 1.00 mg/dL   Calcium 9.4 8.9 - 10.3 mg/dL   GFR, Estimated 43 (L) >60 mL/min   Anion gap 10 5 - 15     EKG EKG Interpretation  Date/Time:  Friday April 25 2022 13:52:37 EST Ventricular Rate:  54 PR Interval:  205 QRS Duration: 100 QT Interval:  453 QTC Calculation: 430 R  Axis:   54 Text Interpretation: Sinus bradycardia No significant change since last tracing Confirmed by Lajean Saver [phone]) on 04/25/2022 5:02:56 PM  Radiology CT Head Wo Contrast  Result Date: 04/25/2022 CLINICAL DATA:  Fall, left head injury EXAM: CT HEAD WITHOUT CONTRAST TECHNIQUE: Contiguous axial images were obtained from the base of the skull through the vertex without intravenous contrast. RADIATION DOSE REDUCTION: This exam was performed according to the departmental dose-optimization program which includes automated exposure control, adjustment of the mA and/or kV according to patient size and/or use of iterative reconstruction technique. COMPARISON:  09/23/2021 FINDINGS: Brain: No evidence of acute infarction, hemorrhage, hydrocephalus, extra-axial collection or mass lesion/mass effect. Mild age related atrophy. Subcortical white matter and periventricular small vessel ischemic changes. Vascular: Intracranial atherosclerosis. Skull: Normal. Negative for fracture or focal lesion. Sinuses/Orbits: The visualized paranasal sinuses are essentially clear. The mastoid air cells are unopacified. Other: None. IMPRESSION: No evidence of acute intracranial abnormality. Mild atrophy with small vessel ischemic changes. Electronically Signed   By: Julian Hy M.D.   On: 04/25/2022 15:33   DG Shoulder Right  Result Date: 04/25/2022 CLINICAL DATA:  Fall.  Shoulder injury. EXAM: RIGHT SHOULDER - 2+ VIEW COMPARISON:  None. FINDINGS: Bones are diffusely demineralized. Comminuted fracture of the proximal humeral diaphysis evident with nearly 3 cm of displacement of the distal fragment relative to the dominant proximal fragment. Mild bony over riding. Bones are diffusely demineralized. Advanced degenerative changes are seen in the right glenohumeral joint. IMPRESSION: Comminuted fracture of the proximal humeral diaphysis with nearly 3 cm of displacement of the distal fragment relative to the dominant proximal  fragment. Electronically Signed   By: Verda Cumins.D.  On: 04/25/2022 14:02    Procedures .Marland KitchenLaceration Repair  Date/Time: 04/25/2022 4:54 PM  Performed by: Lajean Saver, MD Authorized by: Lajean Saver, MD   Consent:    Consent given by:  Patient Anesthesia:    Anesthesia method:  Local infiltration   Local anesthetic:  Lidocaine 2% WITH epi Laceration details:    Location:  Face   Face location:  Forehead   Length (cm):  1 Pre-procedure details:    Preparation:  Patient was prepped and draped in usual sterile fashion and imaging obtained to evaluate for foreign bodies Exploration:    Imaging outcome: foreign body not noted     Wound exploration: wound explored through full range of motion and entire depth of wound visualized     Contaminated: no   Treatment:    Area cleansed with:  Saline   Amount of cleaning:  Standard   Irrigation solution:  Sterile saline Skin repair:    Repair method:  Sutures   Suture size:  5-0   Suture material:  Prolene   Number of sutures:  2 Approximation:    Approximation:  Close Repair type:    Repair type:  Simple Post-procedure details:    Procedure completion:  Tolerated well, no immediate complications     Medications Ordered in ED Medications  lidocaine-EPINEPHrine (XYLOCAINE W/EPI) 2 %-1:200000 (PF) injection 20 mL (has no administration in time range)  HYDROmorphone (DILAUDID) injection 0.5 mg (0.5 mg Intravenous Given 04/25/22 1546)  ondansetron (ZOFRAN) injection 4 mg (4 mg Intravenous Given 04/25/22 1546)    ED Course/ Medical Decision Making/ A&P                             Medical Decision Making Problems Addressed: Closed fracture of proximal end of right humerus, unspecified fracture morphology, initial encounter: acute illness or injury with systemic symptoms that poses a threat to life or bodily functions Contusion of head, initial encounter: acute illness or injury with systemic symptoms that poses a threat to  life or bodily functions Fall from slip, trip, or stumble, initial encounter: acute illness or injury with systemic symptoms that poses a threat to life or bodily functions Laceration of forehead, initial encounter: acute illness or injury  Amount and/or Complexity of Data Reviewed Independent Historian: EMS    Details: hx Labs: ordered. Decision-making details documented in ED Course. Radiology: ordered and independent interpretation performed. Decision-making details documented in ED Course. ECG/medicine tests: ordered and independent interpretation performed. Decision-making details documented in ED Course.  Risk Prescription drug management. Parenteral controlled substances. Decision regarding hospitalization.   Iv ns. Continuous pulse ox and cardiac monitoring. Labs ordered/sent. Imaging ordered.   Differential diagnosis includes humerus fx, shoulder dislocation, head injury/sdh, etc . Dispo decision including potential need for admission considered - will get labs and imaging and reassess.   Reviewed nursing notes and prior charts for additional history. External reports reviewed. Additional history from: ems/fam.   Cardiac monitor: sinus rhythm, rate 60.  Labs reviewed/interpreted by me - wbc and hgb normal.   Xrays reviewed/interpreted by me - fx humerus.   Pt indicates her orthopedist is Dr Griffin Basil, whom she sees for chronic/recurrent issues of left shoulder. Rec close f/u there.   Shoulder immobilizer/sling/ice.   Recheck compartments of arm/upper arm soft, not tense, distal pulse palp. Pain controlled.   CT reviewed/interpreted by me - no hem.   Wound sutured.  Pt/fam confirm tetanus up to date.  Pt currently appears stable for d/c.  Return precautions provided.              Final Clinical Impression(s) / ED Diagnoses Final diagnoses:  None    Rx / DC Orders ED Discharge Orders     None         Lajean Saver, MD 04/25/22 [phone]

## 2022-04-25 NOTE — ED Notes (Signed)
ED TO INPATIENT HANDOFF REPORT  ED Nurse Name and Phone #: Orbie Hurst Name/Age/Gender Tina Frank 82 y.o. female Room/Bed: WA07/WA07  Code Status   Code Status: Prior  Home/SNF/Other Home Patient oriented to: self, place, time, and situation Is this baseline? Yes   Triage Complete: Triage complete  Chief Complaint Humerus fracture [S42.309A]  Triage Note Patient BIB [facility] from home. Tried to get her pants on, lost her balance, struck left side of her head/eye and landed on her right shoulder, dislocating it. No blood thinners, no LOC. C collar in place. Tetanus shot updated last August.   EMS IV 20G left AC 42mg fentanyl   Allergies Allergies  Allergen Reactions   Abaloparatide     Other reaction(s): blurred vision, lightheaded   Meloxicam     Other reaction(s): ?ELEV B/P   Crestor [Rosuvastatin] Other (See Comments)    Muscle aches   Norvasc [Amlodipine Besylate] Swelling    Ankle swelling   Simvastatin Other (See Comments)    Muscle aches   Sulfa Antibiotics Rash   Zetia [Ezetimibe] Other (See Comments)    Muscle aches    Level of Care/Admitting Diagnosis ED Disposition     ED Disposition  Admit   Condition  --   Comment  [facility] [id number]  Level of Care: Med-Surg [16]  [facility]:: No  Covid Evaluation: Asymptomatic - no recent exposure (last 10 days) testing not required  Diagnosis: Humerus fracture [[id number] Admitting Physician: VHiram Gash[[id number] Attending Physician: VHiram Gash[[id number]         B Medical/Surgery History Past Medical History:  Diagnosis Date   CKD (chronic kidney disease), stage II    nephrologist-- dr gMoshe Cipro ([facility])   Diabetes mellitus type 2, diet-controlled [place]    followed by pcp---   (09-20-2019  per pt does not check blood sugar at home)   GERD  (gastroesophageal reflux disease)    History of primary hyperparathyroidism    s/p  parathyroidectomy right inferior on 12-02-2012   Hyperlipidemia    Hypertension    followd by nephrologist----  (09-20-2019  per pt had stress test done some time ago , unsure when/ where, thinks told ok)   OA (osteoarthritis)    Osteoporosis    Prosthetic joint infection ([place]    followed by dr j. hatcher ([place])   left total shoulder arthroplasty 12/ 2016  ; 12/ 2020  revision with explant joint replacement and hemiarthroplasty;  completed 6 month antibiotic 05/ 2021   Past Surgical History:  Procedure Laterality Date   COLONOSCOPY     DILATATION & CURETTAGE/HYSTEROSCOPY WITH MYOSURE N/A 09/28/2019   [place]cedure: [place]  Surgeon: TBrien Few MD;  [place]  Service: Gynecology;  Laterality: N/A;   EYE SURGERY  yrs ago   laser surgery for retinal tear.  (unilateral , pt unsure which side)   PARATHYROIDECTOMY N/A 12/02/2012   Procedure: PARATHYROIDECTOMY with frozen section ;  Surgeon: TEarnstine Regal MD;  [facility];  Service: General;  Laterality: N/A;   SHOULDER INJECTION Left 07/27/2013   Procedure: SHOULDER INJECTION;  Surgeon: DNinetta Lights MD;  [place]  Service: Orthopedics;  Laterality: Left;   STERIOD INJECTION Right 02/14/2015   Procedure: RIGHT SHOULDER CORTISONE INJECTION;  Surgeon: DNinetta Lights MD;  [place] [facility];  Service: Orthopedics;  Laterality: Right;   TOTAL HIP ARTHROPLASTY Left 07/27/2013   Procedure: TOTAL HIP ARTHROPLASTY ANTERIOR APPROACH;  Surgeon: [name], MD;  Location: [place];  Service: Orthopedics;  Laterality: Left;   TOTAL SHOULDER ARTHROPLASTY Left 02/14/2015   Procedure: LEFT TOTAL SHOULDER ARTHROPLASTY;  Surgeon: [name], MD;  Location: [place];  Service: Orthopedics;  Laterality: Left;   TOTAL SHOULDER REVISION Left 02/09/2019   Procedure: TOTAL SHOULDER REVISION;   Surgeon: Hiram Gash, MD;  [facility];  Service: Orthopedics;  Laterality: Left;     A IV Location/Drains/Wounds Patient Lines/Drains/Airways Status     Active Line/Drains/Airways     Name Placement date Placement time Site Days   Peripheral IV 04/25/22 20 G Left Antecubital 04/25/22  1310  Antecubital  less than 1   Incision (Closed) 07/27/13 Hip Left 07/27/13  1000  -- 3194   Incision (Closed) 02/14/15 Shoulder Left 02/14/15  1007  -- 2627   Incision (Closed) 02/09/19 Shoulder Left 02/09/19  1006  -- 1171   Incision (Closed) 09/28/19 Perineum Other (Comment) 09/28/19  0946  -- 940            Intake/Output Last 24 hours  Intake/Output Summary (Last 24 hours) at 04/25/2022 2022 Last data filed at 04/25/2022 1457 Gross per 24 hour  Intake --  Output 1 ml  Net -1 ml    Labs/Imaging Results for orders placed or performed during the hospital encounter of 04/25/22 (from the past 48 hour(s))  CBC     Status: None   Collection Time: 04/25/22  1:53 PM  Result Value Ref Range   WBC 9.6 4.0 - 10.5 K/uL   RBC 4.05 3.87 - 5.11 MIL/uL   Hemoglobin 12.3 12.0 - 15.0 g/dL   HCT 38.2 36.0 - 46.0 %   MCV 94.3 80.0 - 100.0 fL   MCH 30.4 26.0 - 34.0 pg   MCHC 32.2 30.0 - 36.0 g/dL   RDW 14.7 11.5 - 15.5 %   Platelets 199 150 - 400 K/uL   nRBC 0.0 0.0 - 0.2 %    Comment: Performed at [facility], [place] [address] [place], [place] [id number]  Basic metabolic panel     Status: Abnormal   Collection Time: 04/25/22  1:53 PM  Result Value Ref Range   Sodium 135 135 - 145 mmol/L   Potassium 4.1 3.5 - 5.1 mmol/L   Chloride 102 98 - 111 mmol/L   CO2 23 22 - 32 mmol/L   Glucose, Bld 152 (H) 70 - 99 mg/dL    Comment: Glucose reference range applies only to samples taken after fasting for at least 8 hours.   BUN 36 (H) 8 - 23 mg/dL   Creatinine, Ser 1.25 (H) 0.44 - 1.00 mg/dL   Calcium 9.4 8.9 - 10.3 mg/dL   GFR, Estimated 43 (L) >60 mL/min    Comment:  (NOTE) Calculated using the CKD-EPI Creatinine Equation (2021)    Anion gap 10 5 - 15    Comment: Performed at [facility], [place] [address] [place],  [address]   CT HUMERUS RIGHT WO CONTRAST  Result Date: 04/25/2022 CLINICAL DATA:  Upper arm trauma. Pt fell today, onto rt shoulder, fx poss OR EXAM: CT OF THE RIGHT HUMERUS WITHOUT CONTRAST TECHNIQUE: Multidetector CT imaging was performed according to the standard protocol. Multiplanar CT image reconstructions were also generated. RADIATION DOSE REDUCTION: This exam was performed according  to the departmental dose-optimization program which includes automated exposure control, adjustment of the mA and/or kV according to patient size and/or use of iterative reconstruction technique. COMPARISON:  X-ray shoulder 04/25/2022 FINDINGS: Bones/Joint/Cartilage Acute comminuted and displaced proximal mid humeral shaft fracture. Severe degenerative changes of the glenohumeral joint. Mild degenerative changes of the acromioclavicular joint. Acute comminuted minimally displaced base of the acromion fracture (6:75). Cortical irregularity along the radial neck may be artifactual versus represent a nondisplaced fracture. Ligaments Suboptimally assessed by CT. Muscles and Tendons Haziness and heterogeneity of the biceps and deltoid musculature suggestive underlying hematoma. Soft tissues Subcutaneus soft tissue edema. IMPRESSION: 1. Acute comminuted and displaced proximal - mid humeral shaft fracture. 2. Acute comminuted minimally displaced base of the acromion fracture. 3. Associated deltoid and biceps hematoma formation 4. Severe degenerative changes of the glenohumeral joint. 5. Cortical irregularity along the radial neck may be artifactual versus represent a nondisplaced fracture. Recommend dedicated radiographs of the elbow for further evaluation. Electronically Signed   By: Iven Finn M.D.   On: 04/25/2022 19:54   CT Head Wo  Contrast  Result Date: 04/25/2022 CLINICAL DATA:  Fall, left head injury EXAM: CT HEAD WITHOUT CONTRAST TECHNIQUE: Contiguous axial images were obtained from the base of the skull through the vertex without intravenous contrast. RADIATION DOSE REDUCTION: This exam was performed according to the departmental dose-optimization program which includes automated exposure control, adjustment of the mA and/or kV according to patient size and/or use of iterative reconstruction technique. COMPARISON:  09/23/2021 FINDINGS: Brain: No evidence of acute infarction, hemorrhage, hydrocephalus, extra-axial collection or mass lesion/mass effect. Mild age related atrophy. Subcortical white matter and periventricular small vessel ischemic changes. Vascular: Intracranial atherosclerosis. Skull: Normal. Negative for fracture or focal lesion. Sinuses/Orbits: The visualized paranasal sinuses are essentially clear. The mastoid air cells are unopacified. Other: None. IMPRESSION: No evidence of acute intracranial abnormality. Mild atrophy with small vessel ischemic changes. Electronically Signed   By: Julian Hy M.D.   On: 04/25/2022 15:33   DG Shoulder Right  Result Date: 04/25/2022 CLINICAL DATA:  Fall.  Shoulder injury. EXAM: RIGHT SHOULDER - 2+ VIEW COMPARISON:  None. FINDINGS: Bones are diffusely demineralized. Comminuted fracture of the proximal humeral diaphysis evident with nearly 3 cm of displacement of the distal fragment relative to the dominant proximal fragment. Mild bony over riding. Bones are diffusely demineralized. Advanced degenerative changes are seen in the right glenohumeral joint. IMPRESSION: Comminuted fracture of the proximal humeral diaphysis with nearly 3 cm of displacement of the distal fragment relative to the dominant proximal fragment. Electronically Signed   By: Misty Stanley M.D.   On: 04/25/2022 14:02    Pending Labs Unresulted Labs (From admission, onward)    None        Vitals/Pain Today's Vitals   04/25/22 1845 04/25/22 1900 04/25/22 2000 04/25/22 2011  BP:   127/67   Pulse: (!) 55 (!) 52 (!) 52   Resp:   18   Temp:   97.9 F (36.6 C)   TempSrc:      SpO2: (!) 89% (!) 87% 94%   Weight:      Height:      PainSc:    8     Isolation Precautions No active isolations  Medications Medications  lidocaine-EPINEPHrine (XYLOCAINE W/EPI) 2 %-1:200000 (PF) injection 20 mL (has no administration in time range)  HYDROmorphone (DILAUDID) injection 0.5 mg (0.5 mg Intravenous Given 04/25/22 1546)  ondansetron (ZOFRAN) injection 4 mg (4 mg Intravenous Given 04/25/22 1546)  traMADol (ULTRAM) tablet 50 mg (50 mg Oral Given 04/25/22 1913)    Mobility walks     Focused Assessments    R Recommendations: See Admitting Provider Note  Report given to:   Additional Notes:

## 2022-04-25 NOTE — ED Triage Notes (Addendum)
Patient BIB [facility] from home. Tried to get her pants on, lost her balance, struck left side of her head/eye and landed on her right shoulder, dislocating it. No blood thinners, no LOC. C collar in place. Tetanus shot updated last August.   EMS IV 20G left AC 67mg fentanyl

## 2022-04-25 NOTE — Consult Note (Signed)
Tina Frank is one of my patients.  She is very well-known to me.  Unfortunately she had a fall and a complex fracture of her right humeral shaft and proximal humerus just below a end-stage arthritic joint.  She will need a complex reconstruction and this is an inpatient day procedure.  Implants will be removed to be available on Sunday.  Surgical availability is difficult due to outside factors on Sunday however we will plan to do this Sunday morning as a start case.  Orthopedics will admit and formal H&P to follow.  -Admit orders pending. -Nonweightbearing right upper extremity with coaptation splint and sling prior to leaving ER -Regular diet but n.p.o. at midnight tomorrow in prep for surgery on Sunday morning 04/27/2022

## 2022-04-25 NOTE — H&P (View-Only) (Signed)
Tina Frank is one of my patients.  She is very well-known to me.  Unfortunately she had a fall and a complex fracture of her right humeral shaft and proximal humerus just below a end-stage arthritic joint.  She will need a complex reconstruction and this is an inpatient day procedure.  Implants will be removed to be available on Sunday.  Surgical availability is difficult due to outside factors on Sunday however we will plan to do this Sunday morning as a start case.  Orthopedics will admit and formal H&P to follow.  -Admit orders pending. -Nonweightbearing right upper extremity with coaptation splint and sling prior to leaving ER -Regular diet but n.p.o. at midnight tomorrow in prep for surgery on Sunday morning 04/27/2022

## 2022-04-26 MED ORDER — LACTATED RINGERS IV BOLUS
1000.0000 mL | Freq: Once | INTRAVENOUS | Status: AC
  Administered 2022-04-26: 1000 mL via INTRAVENOUS

## 2022-04-26 MED ORDER — ACETAMINOPHEN 500 MG PO TABS
1000.0000 mg | ORAL_TABLET | Freq: Once | ORAL | Status: AC
  Administered 2022-04-27: 1000 mg via ORAL
  Filled 2022-04-26: qty 2

## 2022-04-26 MED ORDER — GABAPENTIN 300 MG PO CAPS
300.0000 mg | ORAL_CAPSULE | Freq: Once | ORAL | Status: AC
  Administered 2022-04-27: 300 mg via ORAL
  Filled 2022-04-26: qty 1

## 2022-04-26 MED ORDER — TRANEXAMIC ACID-NACL 1000-0.7 MG/100ML-% IV SOLN
1000.0000 mg | INTRAVENOUS | Status: AC
  Administered 2022-04-27: 1000 mg via INTRAVENOUS

## 2022-04-26 MED ORDER — CEFAZOLIN SODIUM-DEXTROSE 2-4 GM/100ML-% IV SOLN
2.0000 g | INTRAVENOUS | Status: AC
  Administered 2022-04-27: 2 g via INTRAVENOUS

## 2022-04-26 NOTE — H&P (Signed)
PREOPERATIVE H&P  Chief Complaint: Right proximal humerus  HPI: Tina Frank is a 82 y.o. female who presents for preoperative history and physical prior to scheduled surgery, Procedure(s): REVERSE SHOULDER ARTHROPLASTY OPEN REDUCTION INTERNAL FIXATION (ORIF) PROXIMAL HUMERUS FRACTURE.   Patient has a past medical history significant for CKD, HTN, HLD, prosthetic joint infection left shoulder, GERD.   Patient presented  to [facility] on 02/23/23 after a fall where she landed on her right shoulder. She was trying to change her pants and fell. She landed on her right shoulder. She had immediate pain. She states she knew she did something was wrong after she fell. Patient is well known to [place]. She had a left revision with explantation of joint and antibiotic hemiarthroplasty with Dr. Griffin Basil in 2020. She did reasonable with this. She has been limited with both arms. She has known glenohumeral arthritis of the right shoulder. She is right hand dominant. She denies any pain elsewhere besides the right shoulder. Her son is at the bedside.    She has elected for surgical management.   Past Medical History:  Diagnosis Date   CKD (chronic kidney disease), stage II    nephrologist-- dr Moshe Cipro  [facility])   Diabetes mellitus type 2, diet-controlled ([place])    followed by pcp---   (09-20-2019  per pt does not check blood sugar at home)   GERD (gastroesophageal reflux disease)    History of primary hyperparathyroidism    s/p  parathyroidectomy right inferior on 12-02-2012   Hyperlipidemia    Hypertension    followd by nephrologist----  (09-20-2019  per pt had stress test done some time ago , unsure when/ where, thinks told ok)   OA (osteoarthritis)    Osteoporosis    Prosthetic joint infection ([place])    followed by dr j. hatcher ([place])   left total shoulder arthroplasty 12/ 2016  ; 12/ 2020  revision with explant joint replacement and hemiarthroplasty;  completed 6 month  antibiotic 05/ 2021   Past Surgical History:  Procedure Laterality Date   COLONOSCOPY     DILATATION & CURETTAGE/HYSTEROSCOPY WITH MYOSURE N/A 09/28/2019   Procedure: [place];  Surgeon: Brien Few, MD;  Location: [place];  Service: Gynecology;  Laterality: N/A;   EYE SURGERY  yrs ago   laser surgery for retinal tear.  (unilateral , pt unsure which side)   PARATHYROIDECTOMY N/A 12/02/2012   Procedure: PARATHYROIDECTOMY with frozen section ;  Surgeon: Earnstine Regal, MD;  Location: [facility];  Service: General;  Laterality: N/A;   SHOULDER INJECTION Left 07/27/2013   Procedure: SHOULDER INJECTION;  Surgeon: [name], MD;  Location: [place];  Service: Orthopedics;  Laterality: Left;   STERIOD INJECTION Right 02/14/2015   Procedure: RIGHT SHOULDER CORTISONE INJECTION;  Surgeon: [name], MD;  Location: [place];  Service: Orthopedics;  Laterality: Right;   TOTAL HIP ARTHROPLASTY Left 07/27/2013   Procedure: TOTAL HIP ARTHROPLASTY ANTERIOR APPROACH;  Surgeon: [name], MD;  Location: [place];  Service: Orthopedics;  Laterality: Left;   TOTAL SHOULDER ARTHROPLASTY Left 02/14/2015   Procedure: LEFT TOTAL SHOULDER ARTHROPLASTY;  Surgeon: [name], MD;  Location: [place];  Service: Orthopedics;  Laterality: Left;   TOTAL SHOULDER REVISION Left 02/09/2019   Procedure: TOTAL SHOULDER REVISION;  Surgeon: Hiram Gash, MD;  Location: [facility];  Service: Orthopedics;  Laterality: Left;   Social History   Socioeconomic History  Marital status: Widowed    Spouse name: Not on file   Number of children: Not on file   Years of education: Not on file   Highest education level: Not on file  Occupational History   Not on file  Tobacco Use   Smoking status: Former    Packs/day: 1.00    Years: 20.00    Total pack years: 20.00    Types: Cigarettes    Quit date: 10/28/1987    Years since quitting: 34.5   Smokeless tobacco: Never   Vaping Use   Vaping Use: Never used  Substance and Sexual Activity   Alcohol use: No   Drug use: Never   Sexual activity: Yes    Birth control/protection: Post-menopausal  Other Topics Concern   Not on file  Social History Narrative   Not on file   Social Determinants of Health   Financial Resource Strain: Not on file  Food Insecurity: Not on file  Transportation Needs: Not on file  Physical Activity: Not on file  Stress: Not on file  Social Connections: Not on file   Family History  Problem Relation Age of Onset   Heart failure Mother    Cancer Mother    Breast cancer Mother        in her 38s   Cancer Father    Allergies  Allergen Reactions   Abaloparatide Other (See Comments)    "Burred vision, lightheaded" (patient does not recall this in 2024)   Mobic [Meloxicam] Hypertension   Crestor [Rosuvastatin] Other (See Comments)    Muscle aches   Norvasc [Amlodipine Besylate] Swelling and Other (See Comments)    Ankle swelling   Simvastatin Other (See Comments)    Muscle aches   Sulfa Antibiotics Rash   Zetia [Ezetimibe] Other (See Comments)    Muscle aches   Prior to Admission medications   Medication Sig Start Date End Date Taking? Authorizing Provider  acetaminophen (TYLENOL) 500 MG tablet Take 1,000 mg by mouth 3 (three) times daily.   Yes [provider]  allopurinol (ZYLOPRIM) 300 MG tablet Take 300 mg by mouth daily.   Yes [provider]  amoxicillin (AMOXIL) 500 MG capsule Take 2,000 mg by mouth See admin instructions. Take 2,000 mg by mouth one hour prior to dental procedures 03/22/20  Yes [provider]  atorvastatin (LIPITOR) 10 MG tablet Take 10 mg by mouth at bedtime.    Yes [provider]  calcium carbonate (TUMS - DOSED IN MG ELEMENTAL CALCIUM) 500 MG chewable tablet Chew 1 tablet by mouth 3 (three) times daily as needed for indigestion or heartburn.   Yes [provider]  carvedilol (COREG) 12.5 MG tablet  Take 12.5 mg by mouth See admin instructions. Take 12.5 mg by mouth in the morning and at 5 PM 12/04/14  Yes [provider]  Cholecalciferol (VITAMIN D3) 50 MCG (2000 UT) TABS Take 2,000 Units by mouth every Monday, Wednesday, and Friday.   Yes [provider]  cloNIDine (CATAPRES) 0.1 MG tablet Take 0.1 mg by mouth daily as needed (for hypertension- if systolic b/p is over 0000000).   Yes [provider]  denosumab (PROLIA) 60 MG/ML SOSY injection Inject 60 mg into the skin every 6 (six) months. 03/16/18  Yes Campbell Riches, MD  diclofenac Sodium (VOLTAREN) 1 % GEL Apply 2-4 g topically 4 (four) times daily as needed (to painful sites).   Yes [provider]  furosemide (LASIX) 40 MG tablet Take 40  mg by mouth See admin instructions. Take 40 mg by mouth in the morning and at 5 PM 08/15/17  Yes [provider]  hydrALAZINE (APRESOLINE) 25 MG tablet Take 25 mg by mouth See admin instructions. Take 25 mg by mouth in the morning and at 5 PM   Yes [provider]  pantoprazole (PROTONIX) 40 MG tablet Take 40 mg by mouth See admin instructions. Take 40 mg by mouth in the morning and at 5 PM- BEFORE FOOD 08/23/18  Yes [provider]  telmisartan (MICARDIS) 80 MG tablet Take 0.5 tablets (40 mg total) by mouth daily. Patient taking differently: Take 40 mg by mouth See admin instructions. Take 40 mg by mouth in the morning and at 5 PM 10/07/13  Yes Kelvin Cellar, MD  traMADol (ULTRAM) 50 MG tablet Take 1 tablet (50 mg total) by mouth every 6 (six) hours as needed. 04/25/22  Yes Lajean Saver, MD  famotidine (PEPCID) 20 MG tablet Take by mouth. Patient not taking: Reported on 04/25/2022 02/19/21   [provider]  hydrALAZINE (APRESOLINE) 50 MG tablet Take 50 mg by mouth See admin instructions. Take 50 mg by mouth in the morning and at 5 PM Patient not taking: Reported on 04/25/2022 12/04/14   [provider]  ketoconazole (NIZORAL) 2 %  cream APPLY TOPICALLY TO FEET EVERY NIGHT Patient not taking: Reported on 04/25/2022 10/15/21   Warren Danes, PA-C  moxifloxacin (VIGAMOX) 0.5 % ophthalmic solution Apply 1 drop to eye 4 (four) times daily. Patient not taking: Reported on 04/25/2022 05/05/20   [provider]  ondansetron (ZOFRAN ODT) 4 MG disintegrating tablet 51m ODT q4 hours prn nausea/vomit Patient not taking: Reported on 04/25/2022 06/21/20   YDrenda Freeze MD  prednisoLONE acetate (PRED FORTE) 1 % ophthalmic suspension 1 drop 4 (four) times daily. Patient not taking: Reported on 04/25/2022 05/05/20   [provider]  sucralfate (CARAFATE) 1 g tablet Take 1 tablet (1 g total) by mouth 4 (four) times daily -  with meals and at bedtime. Patient not taking: Reported on 04/25/2022 06/21/20   YDrenda Freeze MD    ROS: All other systems have been reviewed and were otherwise negative with the exception of those mentioned in the HPI and as above.  Physical Exam: General: Alert, no acute distress Cardiovascular: No pedal edema Respiratory: No cyanosis, no use of accessory musculature GI: No organomegaly, abdomen is soft and non-tender Skin: No lesions in the area of chief complaint Neurologic: Sensation intact distally Psychiatric: Patient is competent for consent with normal mood and affect Lymphatic: No axillary or cervical lymphadenopathy  MUSCULOSKELETAL:  RUE: Splint CDI. Skin intact though cannot assess fully beneath splint. Nontender to palpation proximally. She endorses axillary nerve sensation which she states is preserved and symmetrically. Difficult to test axillary motor function due to splint placement. + Motor in  AIN, PIN, Ulnar distributions. Sensation intact in medial, radial, and ulnar distributions. Well perfused digits.   Imaging: CT of the right shoulder/humerus demonstrates an acute comminuted and displaced proximal mid humeral shaft fracture. Severe degenerative changes of the  glenohumeral joint  BMI: Estimated body mass index is 23.3 kg/m as calculated from the following:   Height as of this encounter: 5' 5"$  (1.651 m).   Weight as of this encounter: 63.5 kg.  Lab Results  Component Value Date   ALBUMIN 3.7 06/21/2020   Diabetes:   Patient has a diagnosis of diabetes,  Lab Results  Component Value Date  HGBA1C 5.9 (H) 07/19/2019   Smoking Status:      Assessment: Right proximal humerus  Plan: Plan for Procedure(s): REVERSE SHOULDER ARTHROPLASTY OPEN REDUCTION INTERNAL FIXATION (ORIF) PROXIMAL HUMERUS FRACTURE  This patient requires inpatient admission to manage this problem appropriately. Unfortunately she had a fall and a complex fracture of her right humeral shaft and proximal humerus just below a end-stage arthritic joint. She will need a complex reconstruction and this is an inpatient procedure. Surgical availability is difficult due to outside factors on Sunday however we will plan to do this Sunday morning as a start case.   - Weight Bearing Status/Activity: NWB RUE. Splint in place - Additional recommended labs/tests: None  - VTE Prophylaxis: SCDs. - Pain control: PRN pain medication   The risks benefits and alternatives were discussed with the patient including but not limited to the risks of nonoperative treatment, versus surgical intervention including infection, bleeding, nerve injury,  blood clots, cardiopulmonary complications, morbidity, mortality, among others, and they were willing to proceed.   We additionally specifically discussed risks of axillary nerve injury, infection, periprosthetic fracture, continued pain and longevity of implants prior to beginning procedure.    Patient will be admitted for inpatient treatment for surgery, pain control, OT, prophylactic antibiotics, VTE prophylaxis, and discharge planning.  The patient acknowledged the explanation, agreed to proceed with the plan and consent was signed.   Operative  Plan: ORIF humerus fracture with reverse total shoulder arthroplasty   Severity of Illness: The appropriate patient status for this patient is INPATIENT. Inpatient status is judged to be reasonable and necessary in order to provide the required intensity of service to ensure the patient's safety. The patient's presenting symptoms, physical exam findings, and initial radiographic and laboratory data in the context of their chronic comorbidities is felt to place them at high risk for further clinical deterioration. Furthermore, it is not anticipated that the patient will be medically stable for discharge from the hospital within 2 midnights of admission.    * I certify that at the point of admission it is my clinical judgment that the patient will require inpatient hospital care spanning beyond 2 midnights from the point of admission due to high intensity of service, high risk for further deterioration and high frequency of surveillance required.Ethelda Chick, PA-C  04/26/2022 8:21 AM

## 2022-04-26 NOTE — Plan of Care (Signed)
  Problem: Education: Goal: Knowledge of General Education information will improve Description Including pain rating scale, medication(s)/side effects and non-pharmacologic comfort measures Outcome: Progressing   

## 2022-04-27 ENCOUNTER — Encounter (HOSPITAL_COMMUNITY)
Admission: EM | Disposition: A | Discharge status: Skilled Nursing Facility | Payer: Self-pay | Source: Home / Self Care | Attending: Orthopaedic Surgery

## 2022-04-27 ENCOUNTER — Inpatient Hospital Stay (HOSPITAL_COMMUNITY): Payer: Medicare PPO

## 2022-04-27 ENCOUNTER — Inpatient Hospital Stay (HOSPITAL_COMMUNITY): Payer: Medicare PPO | Admitting: Certified Registered Nurse Anesthetist

## 2022-04-27 DIAGNOSIS — M19011 Primary osteoarthritis, right shoulder: Secondary | ICD-10-CM

## 2022-04-27 DIAGNOSIS — I10 Essential (primary) hypertension: Secondary | ICD-10-CM

## 2022-04-27 DIAGNOSIS — S42301A Unspecified fracture of shaft of humerus, right arm, initial encounter for closed fracture: Secondary | ICD-10-CM

## 2022-04-27 DIAGNOSIS — S42201A Unspecified fracture of upper end of right humerus, initial encounter for closed fracture: Secondary | ICD-10-CM

## 2022-04-27 DIAGNOSIS — Z87891 Personal history of nicotine dependence: Secondary | ICD-10-CM

## 2022-04-27 HISTORY — PX: REVERSE SHOULDER ARTHROPLASTY: SHX5054

## 2022-04-27 HISTORY — PX: ORIF HUMERUS FRACTURE: SHX2126

## 2022-04-27 LAB — GLUCOSE, CAPILLARY
Glucose-Capillary: 113 mg/dL — ABNORMAL HIGH (ref 70–99)
Glucose-Capillary: 142 mg/dL — ABNORMAL HIGH (ref 70–99)

## 2022-04-27 LAB — CBC
HCT: 27.9 % — ABNORMAL LOW (ref 36.0–46.0)
Hemoglobin: 8.7 g/dL — ABNORMAL LOW (ref 12.0–15.0)
MCH: 30.2 pg (ref 26.0–34.0)
MCHC: 31.2 g/dL (ref 30.0–36.0)
MCV: 96.9 fL (ref 80.0–100.0)
Platelets: 146 10*3/uL — ABNORMAL LOW (ref 150–400)
RBC: 2.88 MIL/uL — ABNORMAL LOW (ref 3.87–5.11)
RDW: 14.6 % (ref 11.5–15.5)
WBC: 8.5 10*3/uL (ref 4.0–10.5)
nRBC: 0 % (ref 0.0–0.2)

## 2022-04-27 LAB — CREATININE, SERUM
Creatinine, Ser: 1.34 mg/dL — ABNORMAL HIGH (ref 0.44–1.00)
GFR, Estimated: 39 mL/min — ABNORMAL LOW (ref >60)

## 2022-04-27 LAB — SURGICAL PCR SCREEN
MRSA, PCR: NEGATIVE
Staphylococcus aureus: POSITIVE — AB

## 2022-04-27 SURGERY — ARTHROPLASTY, SHOULDER, TOTAL, REVERSE
Anesthesia: General | Site: Shoulder | Laterality: Right

## 2022-04-27 SURGERY — ARTHROPLASTY, SHOULDER, TOTAL, REVERSE
Anesthesia: Choice | Site: Shoulder | Laterality: Right

## 2022-04-27 MED ORDER — LACTATED RINGERS IV SOLN: INTRAVENOUS | Status: DC

## 2022-04-27 MED ORDER — ONDANSETRON HCL 4 MG PO TABS
4.0000 mg | ORAL_TABLET | Freq: Four times a day (QID) | ORAL | Status: DC | PRN
  Administered 2022-04-29 – 2022-04-30 (×2): 4 mg via ORAL
  Filled 2022-04-27 (×2): qty 1

## 2022-04-27 MED ORDER — ONDANSETRON HCL 4 MG/2ML IJ SOLN: 4.0000 mg | Freq: Once | INTRAMUSCULAR | Status: DC | PRN

## 2022-04-27 MED ORDER — FENTANYL CITRATE PF 50 MCG/ML IJ SOSY: 25.0000 ug | PREFILLED_SYRINGE | INTRAMUSCULAR | Status: DC | PRN

## 2022-04-27 MED ORDER — PHENYLEPHRINE 80 MCG/ML (10ML) SYRINGE FOR IV PUSH (FOR BLOOD PRESSURE SUPPORT)
PREFILLED_SYRINGE | INTRAVENOUS | Status: DC | PRN
  Administered 2022-04-27 (×2): 80 ug via INTRAVENOUS

## 2022-04-27 MED ORDER — GABAPENTIN 300 MG PO CAPS
ORAL_CAPSULE | ORAL | Status: AC
  Filled 2022-04-27: qty 1

## 2022-04-27 MED ORDER — CEFAZOLIN SODIUM-DEXTROSE 2-4 GM/100ML-% IV SOLN
INTRAVENOUS | Status: AC
  Filled 2022-04-27: qty 100

## 2022-04-27 MED ORDER — LIDOCAINE 2% (20 MG/ML) 5 ML SYRINGE
INTRAMUSCULAR | Status: DC | PRN
  Administered 2022-04-27: 40 mg via INTRAVENOUS

## 2022-04-27 MED ORDER — FENTANYL CITRATE (PF) 100 MCG/2ML IJ SOLN
INTRAMUSCULAR | Status: DC | PRN
  Administered 2022-04-27: 75 ug via INTRAVENOUS

## 2022-04-27 MED ORDER — ALBUMIN HUMAN 5 % IV SOLN: INTRAVENOUS | Status: DC | PRN

## 2022-04-27 MED ORDER — OXYCODONE HCL 5 MG/5ML PO SOLN: 5.0000 mg | Freq: Once | ORAL | Status: DC | PRN

## 2022-04-27 MED ORDER — PHENOL 1.4 % MT LIQD: 1.0000 | OROMUCOSAL | Status: DC | PRN

## 2022-04-27 MED ORDER — HYDROCODONE-ACETAMINOPHEN 7.5-325 MG PO TABS
1.0000 | ORAL_TABLET | ORAL | Status: DC | PRN
  Administered 2022-04-30: 1 via ORAL
  Filled 2022-04-27 (×3): qty 1

## 2022-04-27 MED ORDER — NAPROXEN 250 MG PO TABS
250.0000 mg | ORAL_TABLET | Freq: Two times a day (BID) | ORAL | Status: DC
  Administered 2022-04-28 (×2): 250 mg via ORAL
  Filled 2022-04-27 (×4): qty 1

## 2022-04-27 MED ORDER — MORPHINE SULFATE (PF) 2 MG/ML IV SOLN
0.5000 mg | INTRAVENOUS | Status: DC | PRN
  Administered 2022-04-29: 1 mg via INTRAVENOUS
  Filled 2022-04-27: qty 1

## 2022-04-27 MED ORDER — ACETAMINOPHEN 500 MG PO TABS
500.0000 mg | ORAL_TABLET | Freq: Four times a day (QID) | ORAL | Status: AC
  Administered 2022-04-27 – 2022-04-28 (×3): 500 mg via ORAL
  Filled 2022-04-27 (×3): qty 1

## 2022-04-27 MED ORDER — TRANEXAMIC ACID-NACL 1000-0.7 MG/100ML-% IV SOLN
INTRAVENOUS | Status: AC
  Filled 2022-04-27: qty 100

## 2022-04-27 MED ORDER — DEXMEDETOMIDINE HCL IN NACL 80 MCG/20ML IV SOLN
INTRAVENOUS | Status: AC
  Filled 2022-04-27: qty 40

## 2022-04-27 MED ORDER — METHOCARBAMOL 1000 MG/10ML IJ SOLN: 500.0000 mg | Freq: Four times a day (QID) | INTRAVENOUS | Status: DC | PRN

## 2022-04-27 MED ORDER — ALBUMIN HUMAN 5 % IV SOLN
12.5000 g | Freq: Once | INTRAVENOUS | Status: AC
  Administered 2022-04-27: 12.5 g via INTRAVENOUS

## 2022-04-27 MED ORDER — ALBUMIN HUMAN 5 % IV SOLN
INTRAVENOUS | Status: AC
  Filled 2022-04-27: qty 250

## 2022-04-27 MED ORDER — MUPIROCIN 2 % EX OINT
1.0000 | TOPICAL_OINTMENT | Freq: Two times a day (BID) | CUTANEOUS | Status: DC
  Administered 2022-04-27 – 2022-04-30 (×6): 1 via NASAL
  Filled 2022-04-27 (×2): qty 22

## 2022-04-27 MED ORDER — CHLORHEXIDINE GLUCONATE CLOTH 2 % EX PADS: 6.0000 | MEDICATED_PAD | Freq: Every day | CUTANEOUS | Status: DC

## 2022-04-27 MED ORDER — VANCOMYCIN HCL 1000 MG IV SOLR
INTRAVENOUS | Status: DC | PRN
  Administered 2022-04-27: 1000 mg via TOPICAL

## 2022-04-27 MED ORDER — SODIUM CHLORIDE 0.9 % IR SOLN
Status: DC | PRN
  Administered 2022-04-27: 1000 mL

## 2022-04-27 MED ORDER — LACTATED RINGERS IV SOLN: INTRAVENOUS | Status: DC | PRN

## 2022-04-27 MED ORDER — POLYETHYLENE GLYCOL 3350 17 G PO PACK: 17.0000 g | PACK | Freq: Every day | ORAL | Status: DC | PRN

## 2022-04-27 MED ORDER — ONDANSETRON HCL 4 MG/2ML IJ SOLN
INTRAMUSCULAR | Status: AC
  Filled 2022-04-27: qty 2

## 2022-04-27 MED ORDER — DIPHENHYDRAMINE HCL 12.5 MG/5ML PO ELIX: 12.5000 mg | ORAL_SOLUTION | ORAL | Status: DC | PRN

## 2022-04-27 MED ORDER — PROPOFOL 10 MG/ML IV BOLUS
INTRAVENOUS | Status: AC
  Filled 2022-04-27: qty 20

## 2022-04-27 MED ORDER — 0.9 % SODIUM CHLORIDE (POUR BTL) OPTIME
TOPICAL | Status: DC | PRN
  Administered 2022-04-27: 1000 mL

## 2022-04-27 MED ORDER — DOCUSATE SODIUM 100 MG PO CAPS
100.0000 mg | ORAL_CAPSULE | Freq: Two times a day (BID) | ORAL | Status: DC
  Administered 2022-04-27 – 2022-04-30 (×5): 100 mg via ORAL
  Filled 2022-04-27 (×5): qty 1

## 2022-04-27 MED ORDER — PHENYLEPHRINE HCL-NACL 20-0.9 MG/250ML-% IV SOLN
INTRAVENOUS | Status: DC | PRN
  Administered 2022-04-27: 40 ug/min via INTRAVENOUS

## 2022-04-27 MED ORDER — GLYCOPYRROLATE 0.2 MG/ML IJ SOLN
INTRAMUSCULAR | Status: AC
  Filled 2022-04-27: qty 1

## 2022-04-27 MED ORDER — CEFAZOLIN SODIUM-DEXTROSE 1-4 GM/50ML-% IV SOLN
1.0000 g | Freq: Four times a day (QID) | INTRAVENOUS | Status: AC
  Administered 2022-04-27 – 2022-04-28 (×3): 1 g via INTRAVENOUS
  Filled 2022-04-27 (×3): qty 50

## 2022-04-27 MED ORDER — FLEET ENEMA 7-19 GM/118ML RE ENEM: 1.0000 | ENEMA | Freq: Once | RECTAL | Status: DC | PRN

## 2022-04-27 MED ORDER — STERILE WATER FOR IRRIGATION IR SOLN
Status: DC | PRN
  Administered 2022-04-27: 2000 mL

## 2022-04-27 MED ORDER — BUPIVACAINE HCL (PF) 0.5 % IJ SOLN
INTRAMUSCULAR | Status: DC | PRN
  Administered 2022-04-27: 15 mL via PERINEURAL

## 2022-04-27 MED ORDER — GLYCOPYRROLATE 0.2 MG/ML IJ SOLN
INTRAMUSCULAR | Status: DC | PRN
  Administered 2022-04-27: .1 mg via INTRAVENOUS

## 2022-04-27 MED ORDER — OXYCODONE HCL 5 MG PO TABS: 5.0000 mg | ORAL_TABLET | Freq: Once | ORAL | Status: DC | PRN

## 2022-04-27 MED ORDER — METHOCARBAMOL 500 MG PO TABS
500.0000 mg | ORAL_TABLET | Freq: Four times a day (QID) | ORAL | Status: DC | PRN
  Administered 2022-04-28: 500 mg via ORAL
  Filled 2022-04-27: qty 1

## 2022-04-27 MED ORDER — EPHEDRINE SULFATE-NACL 50-0.9 MG/10ML-% IV SOSY
PREFILLED_SYRINGE | INTRAVENOUS | Status: DC | PRN
  Administered 2022-04-27: 10 mg via INTRAVENOUS
  Administered 2022-04-27: 5 mg via INTRAVENOUS
  Administered 2022-04-27: 7.5 mg via INTRAVENOUS

## 2022-04-27 MED ORDER — ACETAMINOPHEN 500 MG PO TABS
ORAL_TABLET | ORAL | Status: AC
  Filled 2022-04-27: qty 2

## 2022-04-27 MED ORDER — ROCURONIUM BROMIDE 10 MG/ML (PF) SYRINGE
PREFILLED_SYRINGE | INTRAVENOUS | Status: DC | PRN
  Administered 2022-04-27: 50 mg via INTRAVENOUS

## 2022-04-27 MED ORDER — VANCOMYCIN HCL 1000 MG IV SOLR
INTRAVENOUS | Status: AC
  Filled 2022-04-27: qty 20

## 2022-04-27 MED ORDER — ALUM & MAG HYDROXIDE-SIMETH 200-200-20 MG/5ML PO SUSP: 30.0000 mL | ORAL | Status: DC | PRN

## 2022-04-27 MED ORDER — PROPOFOL 10 MG/ML IV BOLUS
INTRAVENOUS | Status: DC | PRN
  Administered 2022-04-27: 60 mg via INTRAVENOUS

## 2022-04-27 MED ORDER — BUPIVACAINE LIPOSOME 1.3 % IJ SUSP
INTRAMUSCULAR | Status: DC | PRN
  Administered 2022-04-27: 10 mL via PERINEURAL

## 2022-04-27 MED ORDER — METOCLOPRAMIDE HCL 5 MG/ML IJ SOLN: 5.0000 mg | Freq: Three times a day (TID) | INTRAMUSCULAR | Status: DC | PRN

## 2022-04-27 MED ORDER — DEXAMETHASONE SODIUM PHOSPHATE 10 MG/ML IJ SOLN
INTRAMUSCULAR | Status: DC | PRN
  Administered 2022-04-27: 5 mg via INTRAVENOUS

## 2022-04-27 MED ORDER — BISACODYL 10 MG RE SUPP: 10.0000 mg | Freq: Every day | RECTAL | Status: DC | PRN

## 2022-04-27 MED ORDER — DEXAMETHASONE SODIUM PHOSPHATE 10 MG/ML IJ SOLN
INTRAMUSCULAR | Status: AC
  Filled 2022-04-27: qty 1

## 2022-04-27 MED ORDER — ENOXAPARIN SODIUM 40 MG/0.4ML IJ SOSY
40.0000 mg | PREFILLED_SYRINGE | INTRAMUSCULAR | Status: DC
  Administered 2022-04-28: 40 mg via SUBCUTANEOUS
  Filled 2022-04-27: qty 0.4

## 2022-04-27 MED ORDER — HYDROCODONE-ACETAMINOPHEN 5-325 MG PO TABS
1.0000 | ORAL_TABLET | ORAL | Status: DC | PRN
  Administered 2022-04-28 (×2): 1 via ORAL
  Administered 2022-04-29: 2 via ORAL
  Administered 2022-04-29 – 2022-04-30 (×2): 1 via ORAL
  Filled 2022-04-27: qty 1
  Filled 2022-04-27: qty 2
  Filled 2022-04-27 (×3): qty 1

## 2022-04-27 MED ORDER — SUGAMMADEX SODIUM 200 MG/2ML IV SOLN
INTRAVENOUS | Status: DC | PRN
  Administered 2022-04-27: 200 mg via INTRAVENOUS

## 2022-04-27 MED ORDER — FENTANYL CITRATE (PF) 100 MCG/2ML IJ SOLN
INTRAMUSCULAR | Status: AC
  Filled 2022-04-27: qty 2

## 2022-04-27 MED ORDER — METOCLOPRAMIDE HCL 5 MG PO TABS: 5.0000 mg | ORAL_TABLET | Freq: Three times a day (TID) | ORAL | Status: DC | PRN

## 2022-04-27 MED ORDER — MENTHOL 3 MG MT LOZG: 1.0000 | LOZENGE | OROMUCOSAL | Status: DC | PRN

## 2022-04-27 MED ORDER — ONDANSETRON HCL 4 MG/2ML IJ SOLN
4.0000 mg | Freq: Four times a day (QID) | INTRAMUSCULAR | Status: DC | PRN
  Administered 2022-04-28 – 2022-04-29 (×2): 4 mg via INTRAVENOUS
  Filled 2022-04-27 (×2): qty 2

## 2022-04-27 SURGICAL SUPPLY — 108 items
AID PSTN UNV HD RSTRNT DISP (MISCELLANEOUS) ×1
APL PRP STRL LF DISP 70% ISPRP (MISCELLANEOUS) ×2
AUG BASEPLATE 15DEG 25 WEDGE (Joint) ×1 IMPLANT
AUGMENT BASEPLATE 15DEG 25 WDG (Joint) IMPLANT
BAG COUNTER SPONGE SURGICOUNT (BAG) ×1 IMPLANT
BAG SPNG CNTER NS LX DISP (BAG) ×1
BIT DRILL 3.2 PERIPHERAL SCREW (BIT) IMPLANT
BIT DRILL 4.3 (BIT) IMPLANT
BIT DRILL QC 3.3X195 (BIT) IMPLANT
BLADE SAW SGTL 73X25 THK (BLADE) ×1 IMPLANT
BSPLAT GLND 15D 25 FULL WDG (Joint) ×1 IMPLANT
CAP LOCK NCB (Cap) IMPLANT
CAP LOCKING COCR (Cap) IMPLANT
CHLORAPREP W/TINT 26 (MISCELLANEOUS) ×2 IMPLANT
CLSR STERI-STRIP ANTIMIC 1/2X4 (GAUZE/BANDAGES/DRESSINGS) ×1 IMPLANT
COMP GLENOID PROX FIX 9 (Orthopedic Implant) ×1 IMPLANT
COMPONENT GLENOID PROX FIX 9 (Orthopedic Implant) IMPLANT
COOLER ICEMAN CLASSIC (MISCELLANEOUS) IMPLANT
COVER BACK TABLE 60X90IN (DRAPES) ×1 IMPLANT
COVER SURGICAL LIGHT HANDLE (MISCELLANEOUS) ×1 IMPLANT
DRAPE C-ARM 42X120 X-RAY (DRAPES) ×1 IMPLANT
DRAPE INCISE IOBAN 66X45 STRL (DRAPES) ×1 IMPLANT
DRAPE ORTHO SPLIT 77X108 STRL (DRAPES) ×2
DRAPE SHEET LG 3/4 BI-LAMINATE (DRAPES) ×2 IMPLANT
DRAPE SURG ORHT 6 SPLT 77X108 (DRAPES) ×2 IMPLANT
DRAPE TOP 10253 STERILE (DRAPES) ×1 IMPLANT
DRILL BIT 4.3 (BIT) ×1
DRSG AQUACEL AG ADV 3.5X 4 (GAUZE/BANDAGES/DRESSINGS) ×2 IMPLANT
DRSG AQUACEL AG ADV 3.5X 6 (GAUZE/BANDAGES/DRESSINGS) ×1 IMPLANT
DRSG AQUACEL AG ADV 3.5X14 (GAUZE/BANDAGES/DRESSINGS) IMPLANT
DRSG MEPILEX POST OP 4X8 (GAUZE/BANDAGES/DRESSINGS) ×1 IMPLANT
DURAPREP 26ML APPLICATOR (WOUND CARE) ×1 IMPLANT
ELECT BLADE TIP CTD 4 INCH (ELECTRODE) ×1 IMPLANT
ELECT PENCIL ROCKER SW 15FT (MISCELLANEOUS) IMPLANT
ELECT REM PT RETURN 15FT ADLT (MISCELLANEOUS) ×1 IMPLANT
FACESHIELD WRAPAROUND (MASK) ×2 IMPLANT
FACESHIELD WRAPAROUND OR TEAM (MASK) ×2 IMPLANT
FIBERTAPE CERCLAGE TLINK SUT (SUTURE) IMPLANT
GAUZE XEROFORM 5X9 LF (GAUZE/BANDAGES/DRESSINGS) IMPLANT
GLOVE BIO SURGEON STRL SZ 6.5 (GLOVE) ×2 IMPLANT
GLOVE BIO SURGEON STRL SZ8 (GLOVE) ×2 IMPLANT
GLOVE BIOGEL PI IND STRL 6.5 (GLOVE) ×1 IMPLANT
GLOVE BIOGEL PI IND STRL 8 (GLOVE) ×1 IMPLANT
GLOVE ECLIPSE 8.0 STRL XLNG CF (GLOVE) ×2 IMPLANT
GOWN STRL REUS W/ TWL LRG LVL3 (GOWN DISPOSABLE) ×2 IMPLANT
GOWN STRL REUS W/TWL LRG LVL3 (GOWN DISPOSABLE) ×2
GUIDEWIRE GLENOID 2.5X220 (WIRE) IMPLANT
HANDPIECE INTERPULSE COAX TIP (DISPOSABLE) ×1
HEAD GLENOSP CANN REV SHLD 39 (Head) IMPLANT
IMPL REVERSE SHOULDER 0X3.5 (Shoulder) IMPLANT
IMPLANT REVERSE SHOULDER 0X3.5 (Shoulder) ×1 IMPLANT
INSERT FLEX SHLD REV 39 +6 (Insert) IMPLANT
KIT BASIN OR (CUSTOM PROCEDURE TRAY) ×1 IMPLANT
KIT STABILIZATION SHOULDER (MISCELLANEOUS) ×1 IMPLANT
KIT TURNOVER KIT A (KITS) IMPLANT
MANIFOLD NEPTUNE II (INSTRUMENTS) ×1 IMPLANT
NDL MAYO CATGUT SZ4 TPR NDL (NEEDLE) IMPLANT
NEEDLE MAYO CATGUT SZ4 (NEEDLE) IMPLANT
NS IRRIG 1000ML POUR BTL (IV SOLUTION) ×1 IMPLANT
PACK SHOULDER (CUSTOM PROCEDURE TRAY) ×1 IMPLANT
PAD COLD SHLDR WRAP-ON (PAD) IMPLANT
PLATE FRACTURE NCB PA 174 H12 (Plate) IMPLANT
RESTRAINT HEAD UNIVERSAL NS (MISCELLANEOUS) ×1 IMPLANT
SCREW 5.5X22 (Screw) IMPLANT
SCREW BONE INTRNL SM 7 (Screw) IMPLANT
SCREW CANN NCB PA 6.2X4.4/5 (Screw) IMPLANT
SCREW CORTICAL 5.0X10MM (Screw) IMPLANT
SCREW NCB 4.0X20MM (Screw) IMPLANT
SCREW PERIPHERAL 42 (Screw) IMPLANT
SCREW REV AEQUALIS 40 (Screw) IMPLANT
SCREW UNI 5.0 12MM (Screw) IMPLANT
SCREW UNI CORTICAL 5.0X14MM (Screw) IMPLANT
SCREW UNICORTICAL 5.0X14 (Screw) IMPLANT
SET HNDPC FAN SPRY TIP SCT (DISPOSABLE) ×1 IMPLANT
SLEEVE SCD COMPRESS KNEE MED (STOCKING) IMPLANT
SLING ARM FOAM STRAP LRG (SOFTGOODS) ×1 IMPLANT
SLING ARM FOAM STRAP MED (SOFTGOODS) IMPLANT
SLING ARM FOAM STRAP XLG (SOFTGOODS) IMPLANT
SLING ARM IMMOBILIZER LRG (SOFTGOODS) IMPLANT
SLING ARM IMMOBILIZER MED (SOFTGOODS) IMPLANT
SLING ULTRA II AB MED (SLING) IMPLANT
SPACER AEQUALIS REV 9X20 (Spacer) IMPLANT
SPIKE FLUID TRANSFER (MISCELLANEOUS) IMPLANT
SPONGE T-LAP 4X18 ~~LOC~~+RFID (SPONGE) ×1 IMPLANT
STAPLER VISISTAT 35W (STAPLE) IMPLANT
STEM HUM SHLD AEQ 11X130 (Shoulder) IMPLANT
STRIP CLOSURE SKIN 1/2X4 (GAUZE/BANDAGES/DRESSINGS) ×1 IMPLANT
SUCTION FRAZIER HANDLE 12FR (TUBING)
SUCTION TUBE FRAZIER 12FR DISP (TUBING) IMPLANT
SUT ETHIBOND 2 OS 4 DA (SUTURE) IMPLANT
SUT ETHIBOND 2 V 37 (SUTURE) ×1 IMPLANT
SUT ETHIBOND NAB CT1 #1 30IN (SUTURE) ×1 IMPLANT
SUT ETHILON 3 0 PS 1 (SUTURE) IMPLANT
SUT FIBERWIRE #2 38 T-5 BLUE (SUTURE) ×2
SUT FIBERWIRE #5 38 CONV NDL (SUTURE)
SUT MNCRL AB 4-0 PS2 18 (SUTURE) ×1 IMPLANT
SUT VIC AB 0 CT1 27 (SUTURE) ×1
SUT VIC AB 0 CT1 27XBRD ANBCTR (SUTURE) ×1 IMPLANT
SUT VIC AB 0 CT1 36 (SUTURE) IMPLANT
SUT VIC AB 2-0 SH 27 (SUTURE)
SUT VIC AB 2-0 SH 27XBRD (SUTURE) IMPLANT
SUT VIC AB 3-0 SH 27 (SUTURE) ×1
SUT VIC AB 3-0 SH 27X BRD (SUTURE) ×1 IMPLANT
SUTURE FIBERWR #2 38 T-5 BLUE (SUTURE) ×2 IMPLANT
SUTURE FIBERWR #5 38 CONV NDL (SUTURE) IMPLANT
TOWEL OR 17X26 10 PK STRL BLUE (TOWEL DISPOSABLE) ×1 IMPLANT
TUBE SUCTION HIGH CAP CLEAR NV (SUCTIONS) ×1 IMPLANT
WATER STERILE IRR 1000ML POUR (IV SOLUTION) ×2 IMPLANT

## 2022-04-27 NOTE — Plan of Care (Signed)
  Problem: Education: Goal: Knowledge of General Education information will improve Description Including pain rating scale, medication(s)/side effects and non-pharmacologic comfort measures Outcome: Progressing   

## 2022-04-27 NOTE — Anesthesia Procedure Notes (Signed)
Anesthesia Regional Block: Interscalene brachial plexus block   Pre-Anesthetic Checklist: , timeout performed,  Correct Patient, Correct Site, Correct Laterality,  Correct Procedure, Correct Position, site marked,  Risks and benefits discussed,  Surgical consent,  Pre-op evaluation,  At surgeon's request and post-op pain management  Laterality: Left  Prep: chloraprep       Needles:  Injection technique: Single-shot  Needle Type: Echogenic Stimulator Needle     Needle Length: 9cm      Additional Needles:   Procedures:,,,, ultrasound used (permanent image in chart),,     Nerve Stimulator or Paresthesia:  Response: 0.5 mA  Additional Responses:   Narrative:  Start time: 04/27/2022 7:10 AM End time: 04/27/2022 7:20 AM Injection made incrementally with aspirations every 5 mL.  Performed by: Personally  Anesthesiologist: Myrtie Soman, MD  Additional Notes: Patient tolerated the procedure well without complications

## 2022-04-27 NOTE — Anesthesia Procedure Notes (Signed)
Anesthesia Procedure Image    

## 2022-04-27 NOTE — Op Note (Signed)
Orthopaedic Surgery Operative Note (CSN: [id number])  Tina Frank  1941/01/08 Date of Surgery: 04/27/2022   Diagnoses:  Right humeral shaft and proximal humerus fracture with right end-stage glenohumeral arthritis  Procedure: Right reverse total Shoulder Arthroplasty Right humeral shaft open reduction internal fixation   Operative Finding Successful completion of planned procedure.  Patient's bone quality was quite poor.  Her humeral fixation actually came together well considering her poor bone quality.  We bypassed her fracture by about 8 cm with her stem into reasonable bone and augmented our fixation with 2 Arthrex cerclage fiber tapes as well as a long plate which essentially acts almost like a nonbiologic strut.  My main concern for this case in reality is actually her glenoid fixation.  Her glenoid was extremely poor quality and her vault was quite small.  We had to use a full wedge implant without a post as the post was likely going to fracture through the small vault.  We obtained 4 peripheral screws however there is still a high likelihood of failure.  If that was to happen we would likely switch her to a hemiarthroplasty component.  Patient will be in a sling for 6 weeks, and can be used for ADLs while in the sling however we would not recommend any range of motion of the shoulder.  Okay for therapy to instruct on elbow and wrist range of motion with careful protection of the shoulder.  Post-operative plan: The patient will be NWB in sling.  The patient will be will be admitted to observation due to medical complexity, monitoring and pain management.  DVT prophylaxis Lovenox 40 mg/day until mobilizing and then consider transition in clinic to alternative medicines.  Pain control with PRN pain medication preferring oral medicines.  Follow up plan will be scheduled in approximately 7 days for incision check and XR.  Physical therapy to start after 6 weeks.  Implants: Tornier revive  size 11 distal stem 130 mm, 2-55m segments 9 mm, 9 proximal body, 40 mm interlocking screw and locking cap.  0 high offset tray, 39+6 retentive, 2 Arthrex fiber tape cerclage, Biomet NCB 10 hole plate, 25 full wedge glenoid, 4 peripheral screws and 39 standard glenosphere  Post-Op Diagnosis: Same Surgeons:Primary: VHiram Gash MD Assistants:Caroline McBane PA-C [facility] Anesthesia: General with Exparel Interscalene Antibiotics: Ancef 2g preop, Vancomycin 10085mlocally Tourniquet time: None Estimated Blood Loss: [id number]: None Specimens: None Implants: Implant Name Type Inv. Item Serial No. Manufacturer Lot No. LRB No. Used Action  SCREW REV AEQUALIS 40 - [id number] SCREW REV AEQUALIS 40  [id number] Implanted  PTC DISTAL STEM [id number] + Ti    [employer] [id number] Implanted  SPACER AEQUALIS REV 9X20 - [id number] SPACER AEQUALIS REV 9X20  [employer] [id number] 1 Implanted  CAP LOCKING COCR - [id number] CAP LOCKING COCR  [employer] [id number] 1 Implanted  COMP GLENOID PROX FIX 9 - [id number] Implant COMP GLENOID PROX FIX 9  [employer] [id number] 1 Implanted  SPACER AEQUALIS REV 9X20 - [id number] SPACER AEQUALIS REV 9X20  [employer] [id number] 1 Implanted  CAP LOCK NCB - [id number] CAP LOCK NCB  ZIMMER RECON(ORTH,TRAU,BIO,SG)  Right 4 Implanted  SCREW CORTICAL 5.0X10MM - [id number] SCREW CORTICAL 5.0X10MM  [employer](ORTH,TRAU,BIO,SG)  Right 3 Implanted  SCREW CANN NCB PA 6.2X4.4/5 - [id number] SCREW CANN NCB PA 6.2X4.4/5  [employer]MER [employer](ORTH,TRAU,BIO,SG)  Right 1 Implanted  PLATE FRACTURE NCB PA 174 H12 - [id number] Plate PLATE FRACTURE NCB PA 174 H12  [employer]MER [employer](ORTH,TRAU,BIO,SG)  Right 1 Implanted  SCREW BONE INTRNL SM 7 - [id number] Screw SCREW BONE INTRNL SM 7  [employer] [id number] Right 1 Implanted  AUG BASEPLATE 15DEG 25 WEDGE - [id number]  Joint AUG BASEPLATE 15DEG 25 WEDGE  [employer] [id number] Right 1 Implanted  HEAD GLENOSP CANN REV SHLD 39 - [id number] Head HEAD GLENOSP CANN REV SHLD 39  [employer] [id number] Right 1 Implanted  SCREW 5.5X22 - [id number] Screw SCREW 5.5X22  [employer]  Right 3 Implanted  IMPLANT REVERSE SHOULDER 0X3.5 - [id number] Shoulder IMPLANT REVERSE SHOULDER 0X3.5  [employer] [id number] Right 1 Implanted  INSERT FLEX SHLD REV 39 +6 - [id number] Insert INSERT FLEX SHLD REV 39 +6  [employer] [id number] Right 1 Implanted  SCREW PERIPHERAL 42 - [id number] Screw SCREW PERIPHERAL 42  [employer]  Right 1 Implanted    Indications for Surgery:   Tina Frank is a 82 y.o. female with fall resulting in a complex humeral shaft fracture with a ipsilateral end-stage arthritic joint.  We felt that fracture fixation alone in the setting of a comminuted fracture would likely lead to a high risk of nonunion as there is no significant motion that would be allowed at the joint.  Benefits and risks of operative and nonoperative management were discussed prior to surgery with patient/guardian(s) and informed consent form was completed.  Infection and need for further surgery were discussed as was prosthetic stability and cuff issues.  We additionally specifically discussed risks of axillary nerve injury, infection, periprosthetic fracture, continued pain and longevity of implants prior to beginning procedure.      Procedure:   The patient was identified in the preoperative holding area where the surgical site was marked. Block placed by anesthesia with exparel.  The patient was taken to the OR where a procedural timeout was called and the above noted anesthesia was induced.  The patient was positioned beachchair on allen table with spider arm positioner.  Preoperative antibiotics were dosed.  The patient's right shoulder was prepped and draped in the usual sterile fashion.  A second preoperative timeout was called.        We made a extended deltopectoral incision past the biceps from the coracoid essentially to about 8 cm above the olecranon fossa.  Went through skin sharply treatment hemostasis we progressed.  We identified the deltopectoral interval and exposed from there we dissected lateral to the biceps and exposed the deltoid attachment to the humerus and our fracture site which was quite comminuted.  Place self-retaining retractors and at that point we were able to expose the fracture site.  There is a comminuted fracture and we took care to remove hematoma without stripping the fracture fragments.  This point we considered our options and felt that fixation with a combination of cerclage, bypassing the fracture with her stem, and a plate would be appropriate.  We actually able to use a sounder in the revive system and get fit in the distal segment and noting that an 11 sounder appropriately passed.  We felt that obtaining a reduction would be quite difficult due to the arthritic joint and turned our attention to exposing the glenohumeral joint.  We placed blunt retractors protecting the neurovascular structures and performed a subscapularis peel with the Bovie.  We are able to perform external rotation of the proximal segment using point-to-point reduction clamps to manipulate the proximal fragment.  We performed a capsular release and release the subscap taking care to protect the axillary nerve.  We were not able to perform an axillary nerve tug test secondary to subcoracoid scarring.  There was attritional thinning of the subscapularis and was quite contracted.  We at this point were able to expose the humeral head.  A cut guide was used to perform a cut at 130 degrees consistent with the revive stem.  At that point we performed provisional reduction using a series of Tel clips avoiding the posterior fragments that were quite comminuted taking care to align the larger fragments primarily.  We placed an  Arthrex fiber tape cerclage at the main fracture site taking great care to ensure that were directly on bone without interference of the nerves or vessels.  We visualized it and were able to palpate to ensure that there was nothing underneath her cerclage.  We placed 1 at the proximal and distal aspects of the fracture to provide reduction.  We used the tensioner to place these at about 50 of tension on the scale.  At this point we placed a sounder from the revive system and placed it through all of our fragments again confirming that an 11 by 210 mm stem in full construct would be appropriate.  We then built this construct using the final implants after trialing.  We were able to place the final implant getting good fit and rotational control.  Once the stem was placed we then used the Biomet NCB system to place a 10 hole plate bridging most of the fracture fragments.  We obtained unicortical screws primarily as there was very little bone density.  We placed locking All the Screws However Only Able to Get 2 Screws Proximal and Distal.  We Did Feel That This Still Gave [place] Some Control of the Fragments.  This was an open reduction total fixation of the humeral shaft.  Once the plate was placed complete we used fluoroscopy to ensure that our plate position was reasonable.  We did have to air the plate slightly medial in order to get unicortical fixation.  Once happy with the plate we continued onto the glenoid instrumentation.  We performed a upper border subscapularis release and protected inferiorly to avoid damage the axillary nerve.  We exposed the anterior glenoid noted significant calcified labrum and the almost complete destruction of the glenoid.  Based on our blueprint 3D planning we noted that our baseplate needed to be aired slightly posteriorly and anteverted.  We placed our guidepin in the position as templated in the software.  We reamed over this obtaining about 70% baseplate seating.  We  removed peripheral osteophytes from the glenoid.  We drilled for our vault but the vault was not intact.  Felt that the vault was quite limited and the anterior bone I worried about.  Based on this we felt that a ingrowth post construct would be more appropriate.  We placed a 7 mm ingrowth post initially try to place her baseplate however due to the vault sclerotic bone the ingrowth post seem to be limiting our baseplate seating.  At this point I felt that removing the ingrowth post and using the baseplate alone was in the patient's best interest to get better baseplate seating.  We did so and obtain good baseplate placement.  Once this was complete we placed 4 peripheral screws.  We removed peripheral bone and placed a 39 standard glenosphere.  We irrigated throughout the case but irrigated again  at this point.  We placed a trial 0 high offset tray and a +6 polyethylene and felt the tension was reasonable.  We took care not to over tension to put undue tension on the glenoid however the oversized glenosphere gave [place] good stability.  Final implants were placed.  We reduced the joint and had good stability overall.  We repaired the remnant of the anterior capsule and subscapularis to the anterior aspect of the plate using FiberWire's x 2 that were placed through one of the most proximal holes of the plate.  The final joint was taken through range of motion and in maximal external rotation and adduction there was some peripheral bone contact that levered the upper border of the humeral implant away from the glenosphere however it did not dislocate.  We felt that with a sling with a pillow initially the patient would avoid this position as would internal rotation behind the back for the first 3 months.  We irrigated again and placed local vancomycin powder.  We closed the overall exposure incision deep with 0 Vicryl before completing the skin closure with 0 Vicryl, 3-0 Vicryl and 2-0 nylon in a running  fashion.  Aquacel dressing was placed.  Sling was placed.  Patient awoken taken to PACU in stable condition.   Noemi Chapel, PA-C, present and scrubbed throughout the case, critical for completion in a timely fashion, and for retraction, instrumentation, closure.

## 2022-04-27 NOTE — Anesthesia Postprocedure Evaluation (Signed)
Anesthesia Post Note  Patient: [place]  Procedure(s) Performed: REVERSE SHOULDER ARTHROPLASTY (Right: Shoulder) OPEN REDUCTION INTERNAL FIXATION (ORIF) PROXIMAL HUMERUS FRACTURE (Right: Shoulder)     Patient location during evaluation: PACU Anesthesia Type: General Level of consciousness: awake and alert Pain management: pain level controlled Vital Signs Assessment: post-procedure vital signs reviewed and stable Respiratory status: spontaneous breathing, nonlabored ventilation, respiratory function stable and patient connected to nasal cannula oxygen Cardiovascular status: blood pressure returned to baseline and stable Postop Assessment: no apparent nausea or vomiting Anesthetic complications: no  No notable events documented.  Last Vitals:  Vitals:   04/27/22 1058 04/27/22 1103  BP: (!) 75/61 (!) 102/59  Pulse: (!) 59 62  Resp: 15 16  Temp: 36.6 C   SpO2: 100% 100%    Last Pain:  Vitals:   04/27/22 1103  TempSrc:   PainSc: Asleep                 Khrystian Schauf S

## 2022-04-27 NOTE — Transfer of Care (Signed)
Immediate Anesthesia Transfer of Care Note  Patient: Tina Frank  Procedure(s) Performed: Procedure(s): REVERSE SHOULDER ARTHROPLASTY (Right) OPEN REDUCTION INTERNAL FIXATION (ORIF) PROXIMAL HUMERUS FRACTURE (Right)  Patient Location: PACU  Anesthesia Type:General  Level of Consciousness: Alert, Awake, Oriented  Airway & Oxygen Therapy: Patient Spontanous Breathing  Post-op Assessment: Report given to RN  Post vital signs: Reviewed and stable  Last Vitals:  Vitals:   04/27/22 0415 04/27/22 0653  BP: 129/61 124/61  Pulse: 65 (!) 58  Resp: 18 13  Temp: 36.7 C 37.1 C  SpO2: [id number]    Complications: No apparent anesthesia complications

## 2022-04-27 NOTE — Anesthesia Procedure Notes (Signed)
Procedure Name: Intubation Date/Time: 04/27/2022 7:49 AM  Performed by: Gerald Leitz, CRNAPre-anesthesia Checklist: Patient identified, Patient being monitored, Timeout performed, Emergency Drugs available and Suction available Patient Re-evaluated:Patient Re-evaluated prior to induction Oxygen Delivery Method: Circle system utilized Preoxygenation: Pre-oxygenation with 100% oxygen Induction Type: IV induction Ventilation: Mask ventilation without difficulty Laryngoscope Size: Mac and 3 Grade View: Grade I Tube type: Oral Tube size: 7.0 mm Number of attempts: 1 Airway Equipment and Method: Stylet Placement Confirmation: ETT inserted through vocal cords under direct vision, positive ETCO2 and breath sounds checked- equal and bilateral Secured at: 22 cm Tube secured with: Tape Dental Injury: Teeth and Oropharynx as per pre-operative assessment

## 2022-04-27 NOTE — Interval H&P Note (Signed)
All questions answered, patient wants to proceed with procedure. ? ?

## 2022-04-27 NOTE — Anesthesia Preprocedure Evaluation (Signed)
Anesthesia Evaluation  Patient identified by MRN, date of birth, ID band Patient awake    Reviewed: Allergy & Precautions, H&P , NPO status , Patient's Chart, lab work & pertinent test results  Airway Mallampati: II  TM Distance: >3 FB Neck ROM: Full    Dental no notable dental hx.    Pulmonary neg pulmonary ROS, former smoker   Pulmonary exam normal breath sounds clear to auscultation       Cardiovascular hypertension, Normal cardiovascular exam Rhythm:Regular Rate:Normal     Neuro/Psych negative neurological ROS  negative psych ROS   GI/Hepatic Neg liver ROS,GERD  ,,  Endo/Other  diabetes    Renal/GU Renal InsufficiencyRenal disease  negative genitourinary   Musculoskeletal negative musculoskeletal ROS (+)    Abdominal   Peds negative pediatric ROS (+)  Hematology negative hematology ROS (+)   Anesthesia Other Findings   Reproductive/Obstetrics negative OB ROS                             Anesthesia Physical Anesthesia Plan  ASA: 3 and emergent  Anesthesia Plan: General   Post-op Pain Management: Regional block*   Induction: Intravenous  PONV Risk Score and Plan: 3 and Ondansetron, Dexamethasone and Treatment may vary due to age or medical condition  Airway Management Planned: Oral ETT  Additional Equipment:   Intra-op Plan:   Post-operative Plan: Extubation in OR  Informed Consent: I have reviewed the patients History and Physical, chart, labs and discussed the procedure including the risks, benefits and alternatives for the proposed anesthesia with the patient or authorized representative who has indicated his/her understanding and acceptance.     Dental advisory given  Plan Discussed with: CRNA and Surgeon  Anesthesia Plan Comments:        Anesthesia Quick Evaluation

## 2022-04-28 ENCOUNTER — Other Ambulatory Visit: Payer: Self-pay

## 2022-04-28 LAB — BASIC METABOLIC PANEL
Anion gap: 8 (ref 5–15)
BUN: 35 mg/dL — ABNORMAL HIGH (ref 8–23)
CO2: 21 mmol/L — ABNORMAL LOW (ref 22–32)
Calcium: 7.6 mg/dL — ABNORMAL LOW (ref 8.9–10.3)
Chloride: 105 mmol/L (ref 98–111)
Creatinine, Ser: 1.35 mg/dL — ABNORMAL HIGH (ref 0.44–1.00)
GFR, Estimated: 39 mL/min — ABNORMAL LOW (ref >60)
Glucose, Bld: 118 mg/dL — ABNORMAL HIGH (ref 70–99)
Potassium: 4.2 mmol/L (ref 3.5–5.1)
Sodium: 134 mmol/L — ABNORMAL LOW (ref 135–145)

## 2022-04-28 LAB — CBC
HCT: 24.9 % — ABNORMAL LOW (ref 36.0–46.0)
Hemoglobin: 7.9 g/dL — ABNORMAL LOW (ref 12.0–15.0)
MCH: 30.5 pg (ref 26.0–34.0)
MCHC: 31.7 g/dL (ref 30.0–36.0)
MCV: 96.1 fL (ref 80.0–100.0)
Platelets: 138 10*3/uL — ABNORMAL LOW (ref 150–400)
RBC: 2.59 MIL/uL — ABNORMAL LOW (ref 3.87–5.11)
RDW: 14.7 % (ref 11.5–15.5)
WBC: 8.4 10*3/uL (ref 4.0–10.5)
nRBC: 0 % (ref 0.0–0.2)

## 2022-04-28 MED ORDER — ENOXAPARIN SODIUM 30 MG/0.3ML IJ SOSY
30.0000 mg | PREFILLED_SYRINGE | INTRAMUSCULAR | Status: DC
  Administered 2022-04-29 – 2022-04-30 (×2): 30 mg via SUBCUTANEOUS
  Filled 2022-04-28 (×2): qty 0.3

## 2022-04-28 NOTE — Plan of Care (Signed)

## 2022-04-28 NOTE — Progress Notes (Signed)
OT Cancellation Note  Patient Details Name: Tina Frank MRN: [id number] DOB: [birth year]   Cancelled Treatment:    Reason Eval/Treat Not Completed: Pain limiting ability to participate Patient seen with pain medications on board with patient reporting it has not touched pain. Patient asking for therapy to check back on 2/20. OT to continue to follow Tina Frank, [name] Office# [phone]  04/28/2022, 2:09 PM

## 2022-04-28 NOTE — Evaluation (Addendum)
Physical Therapy Evaluation Patient Details Name: Tina Frank MRN: [id number] DOB: 1941-03-01 Today's Date: 04/28/2022  History of Present Illness  Patient is a 82 year old female who presented on 2/16 to the hospital after a fall at home trying to get dressed resulting in dislocating right shoulder. On 2/18, Patient underwent right reverse total shoulder arthroplasty, and right humeral shaft open reduction internal fixation.  PMH: CKD, HTN, HLD, prosthetic joint infection L shoulder, GERD.  Clinical Impression  Pt admitted with above diagnosis.  Pt  limited by dizziness, BP on initial check elevated however likely d/t LE movement with BP cuff on LLE, returned to bed and pt BP 71/61, RN made aware. Pt currently with functional limitations due to the deficits listed below (see PT Problem List). Pt will benefit from skilled PT to increase their independence and safety with mobility to allow discharge to the venue listed below.          Recommendations for follow up therapy are one component of a multi-disciplinary discharge planning process, led by the attending physician.  Recommendations may be updated based on patient status, additional functional criteria and insurance authorization.  Follow Up Recommendations SNF;  follow physician rec      Assistance Recommended at Discharge Frequent or constant Supervision/Assistance  Patient can return home with the following  A little help with bathing/dressing/bathroom;A little help with walking and/or transfers;Assistance with cooking/housework;Assist for transportation;Help with stairs or ramp for entrance    Equipment Recommendations Other (comment) (TBA)  Recommendations for Other Services       Functional Status Assessment Patient has had a recent decline in their functional status and demonstrates the ability to make significant improvements in function in a reasonable and predictable amount of time.     Precautions / Restrictions  Precautions Precautions: Shoulder Type of Shoulder Precautions: "Patient will be in a sling for 6 weeks, and can be used for ADLs while in the sling however we would not recommend any range of motion of the shoulder.  Okay for therapy to instruct on elbow and wrist range of motion with careful protection of the shoulder." Dr.Varkey's note 2/18. Shoulder Interventions: Timmothy Sours joy ultra sling;Shoulder sling/immobilizer;At all times Precaution Booklet Issued: Yes (comment) (handouts) Precaution Comments: NO ROM shoulder, ok hand wrist and elbow Restrictions Weight Bearing Restrictions: Yes RUE Weight Bearing: Non weight bearing      Mobility  Bed Mobility Overal bed mobility: Needs Assistance Bed Mobility: Sit to Supine       Sit to supine: Min assist   General bed mobility comments: assist to control trunk descent and guide LEs on to bed    Transfers Overall transfer level: Needs assistance Equipment used: Rolling walker (2 wheels) Transfers: Sit to/from Stand, Bed to chair/wheelchair/BSC Sit to Stand: Min assist, Mod assist           General transfer comment: verbal cues to power up through LEs  and push up with LUE; pt dizzy in sitting and standing, able to pivot back to bed with min assist to steady, wide BOS    Ambulation/Gait                  Stairs            Wheelchair Mobility    Modified Rankin (Stroke Patients Only)       Balance Overall balance assessment: Needs assistance Sitting-balance support: Single extremity supported Sitting balance-Leahy Scale: Fair     Standing balance support: Single extremity supported Standing  balance-Leahy Scale: Poor Standing balance comment: reliant on external assist to mainain balance;                             Pertinent Vitals/Pain Pain Assessment Pain Assessment: 0-10 Faces Pain Scale: Hurts little more Pain Location: R shoulder Pain Descriptors / Indicators: Discomfort,  Grimacing Pain Intervention(s): Limited activity within patient's tolerance, Monitored during session, Premedicated before session, Repositioned    Home Living Family/patient expects to be discharged to:: Private residence Living Arrangements: Alone Available Help at Discharge: Family Type of Home: House Home Access: Stairs to enter Entrance Stairs-Rails: Psychiatric nurse of Steps: 3   Home Layout: One level Home Equipment: [place] - single Barista (2 wheels);BSC/3in1      Prior Function Prior Level of Function : Independent/Modified Independent                     Hand Dominance        Extremity/Trunk Assessment   Upper Extremity Assessment Upper Extremity Assessment: Defer to OT evaluation RUE Deficits / Details: abduction pillow sling in place at [facility] s time. able to move digits minimally. LUE Deficits / Details: limited AROM of this UE with h/o shoulder replacement and other surgeries. elbow, hand and wrist WFL    Lower Extremity Assessment Lower Extremity Assessment: Generalized weakness    Cervical / Trunk Assessment Cervical / Trunk Assessment: Normal;Other exceptions Cervical / Trunk Exceptions: patients large chest is noted to impact placement of sling  Communication      Cognition Arousal/Alertness: Awake/alert Behavior During Therapy: WFL for tasks assessed/performed Overall Cognitive Status: Within Functional Limits for tasks assessed                                 General Comments: noted to have some memory deficits with family present in room able to assist with PLOF. patient was cooperative and appropirate during session        General Comments      Exercises     Assessment/Plan    PT Assessment Patient needs continued PT services  PT Problem List Decreased strength;Decreased range of motion;Decreased activity tolerance;Decreased mobility;Pain;Decreased knowledge of precautions;Decreased  balance       PT Treatment Interventions DME instruction;Therapeutic exercise;Gait training;Functional mobility training;Therapeutic activities;Patient/family education    PT Goals (Current goals can be found in the Care Plan section)  Acute Rehab PT Goals PT Goal Formulation: With patient Time For Goal Achievement: 05/05/22 Potential to Achieve Goals: Good    Frequency Min 5X/week     Co-evaluation               AM-PAC PT "6 Clicks" Mobility  Outcome Measure Help needed turning from your back to your side while in a flat bed without using bedrails?: A Little Help needed moving from lying on your back to sitting on the side of a flat bed without using bedrails?: A Little Help needed moving to and from a bed to a chair (including a wheelchair)?: A Little Help needed standing up from a chair using your arms (e.g., wheelchair or bedside chair)?: A Little Help needed to walk in hospital room?: A Lot Help needed climbing 3-5 steps with a railing? : Total 6 Click Score: 15    End of Session Equipment Utilized During Treatment: Gait belt Activity Tolerance: Patient tolerated treatment well Patient left: with call  bell/phone within reach;in bed;with bed alarm set;with family/visitor present Nurse Communication: Mobility status PT Visit Diagnosis: Other abnormalities of gait and mobility (R26.89);Difficulty in walking, not elsewhere classified (R26.2)    Time: [id number] PT Time Calculation (min) (ACUTE ONLY): 23 min   Charges:   PT Evaluation $PT Eval Low Complexity: 1 Low PT Treatments $Therapeutic Activity: 8-22 mins        Baxter Flattery, PT  Acute Rehab Dept [facility]) [phone]  [facility] Weekend Pager [facility] only)  [phone]  04/28/2022   [facility] 04/28/2022, 2:06 PM
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
## 2022-04-28 NOTE — Plan of Care (Signed)
  Problem: Education: Goal: Knowledge of General Education information will improve Description: Including pain rating scale, medication(s)/side effects and non-pharmacologic comfort measures Outcome: Progressing   Problem: Activity: Goal: Risk for activity intolerance will decrease Outcome: Progressing   Problem: Nutrition: Goal: Adequate nutrition will be maintained Outcome: Progressing   Problem: Coping: Goal: Level of anxiety will decrease Outcome: Progressing   Problem: Education: Goal: Knowledge of General Education information will improve Description: Including pain rating scale, medication(s)/side effects and non-pharmacologic comfort measures Outcome: Progressing   Problem: Activity: Goal: Risk for activity intolerance will decrease Outcome: Progressing   Problem: Nutrition: Goal: Adequate nutrition will be maintained Outcome: Progressing   Problem: Coping: Goal: Level of anxiety will decrease Outcome: Progressing

## 2022-04-28 NOTE — NC FL2 (Signed)
[place] LEVEL OF CARE FORM     IDENTIFICATION  Patient Name: Tina Frank Birthdate: 07-12-1940 Sex: female Admission Date (Current Location): 04/25/2022  [facility] and [place] Number:  Herbalist and Address:  [facility],  [place], [place]      Provider Number: [id number]  Attending Physician Name and Address:  Hiram Gash, MD  Relative Name and Phone Number:  Arpi Filardi [phone]    Current Level of Care: Hospital Recommended Level of Care: [place] Prior Approval Number:    Date Approved/Denied:   PASRR Number: [id number]  Discharge Plan: SNF    Current Diagnoses: Patient Active Problem List   Diagnosis Date Noted   Humerus fracture 04/25/2022   Fracture of humeral shaft, right, closed 04/25/2022   Arthritis of left ankle 08/15/2020   Benign neoplasm of adrenal gland 08/15/2020   Cyst of kidney, acquired 08/15/2020   Gastroesophageal reflux disease 08/15/2020   Hardening of the aorta (main artery of the heart) ([place]) 08/15/2020   History of primary hyperparathyroidism 08/15/2020   Gout 08/15/2020   Joint pain 08/15/2020   Oral phase dysphagia 08/15/2020   Osteoporosis 08/15/2020   Prediabetes 08/15/2020   Presence of left artificial shoulder joint 08/15/2020   Pure hypercholesterolemia 08/15/2020   Visual disturbance 08/15/2020   Vitamin D deficiency 08/15/2020   Voice hoarseness 08/15/2020   Chronic arthropathy 04/25/2020   Close exposure to COVID-19 virus 11/08/2019   Decubitus skin ulcer 04/12/2019   Hypokalemia 03/15/2019   Prosthetic joint infection ([place]) 02/09/2019   Degenerative joint disease of left shoulder 02/14/2015   Pre-syncope 10/06/2013   Leukocytosis 10/06/2013   Hypotension 10/06/2013   Osteoarthritis 07/27/2013   HTN (hypertension) with goal to be determined 10/28/2011   DM (diabetes mellitus) ([place]) 10/28/2011   Hyperlipidemia 10/28/2011   Reflux  esophagitis    Herpes zoster    Shingles     Orientation RESPIRATION BLADDER Height & Weight     Self, Time, Situation, Place  Normal Continent Weight: 140 lb (63.5 kg) Height:  5' 5"$  (165.1 cm)  BEHAVIORAL SYMPTOMS/MOOD NEUROLOGICAL BOWEL NUTRITION STATUS      Continent Diet (regular)  AMBULATORY STATUS COMMUNICATION OF NEEDS Skin   Limited Assist Verbally Other (Comment) (surgical incision only)                       Personal Care Assistance Level of Assistance  Bathing, Dressing Bathing Assistance: Limited assistance   Dressing Assistance: Limited assistance     Functional Limitations Info  Sight, Hearing, Speech Sight Info: Adequate Hearing Info: Adequate Speech Info: Adequate    SPECIAL CARE FACTORS FREQUENCY  PT (By licensed PT), OT (By licensed OT)     PT Frequency: 5x/wk OT Frequency: 5x/wk            Contractures Contractures Info: Not present    Additional Factors Info  Code Status, Allergies Code Status Info: Full Allergies Info: Abaloparatide, Mobic (Meloxicam), Crestor (Rosuvastatin), Norvasc (Amlodipine Besylate), Simvastatin, Sulfa Antibiotics, Zetia (Ezetimibe)           Current Medications (04/28/2022):  This is the current hospital active medication list Current Facility-Administered Medications  Medication Dose Route Frequency Provider Last Rate Last Admin   acetaminophen (TYLENOL) tablet 500 mg  500 mg Oral Q6H McBane, Caroline N, PA-C   500 mg at 04/28/22 1307   allopurinol (ZYLOPRIM) tablet 300 mg  300 mg Oral Daily McBane, Maylene Roes,  PA-C   300 mg at 04/28/22 1008   alum & mag hydroxide-simeth (MAALOX/MYLANTA) 200-200-20 MG/5ML suspension 30 mL  30 mL Oral Q4H PRN McBane, Maylene Roes, PA-C       atorvastatin (LIPITOR) tablet 10 mg  10 mg Oral QHS Ethelda Chick, PA-C   10 mg at 04/27/22 2223   bisacodyl (DULCOLAX) suppository 10 mg  10 mg Rectal Daily PRN McBane, Maylene Roes, PA-C       carvedilol (COREG) tablet 12.5 mg  12.5 mg  Oral BID WC McBane, Caroline N, PA-C   12.5 mg at 04/28/22 [id number]   Chlorhexidine Gluconate Cloth 2 % PADS 6 each  6 each Topical Daily Hiram Gash, MD       cloNIDine (CATAPRES) tablet 0.1 mg  0.1 mg Oral Daily PRN McBane, Maylene Roes, PA-C       diphenhydrAMINE (BENADRYL) 12.5 MG/5ML elixir 12.5-25 mg  12.5-25 mg Oral Q4H PRN McBane, Maylene Roes, PA-C       docusate sodium (COLACE) capsule 100 mg  100 mg Oral BID Ethelda Chick, PA-C   100 mg at 04/28/22 1008   [START ON 04/29/2022] enoxaparin (LOVENOX) injection 30 mg  30 mg Subcutaneous Q24H McBane, Caroline N, PA-C       hydrALAZINE (APRESOLINE) tablet 25 mg  25 mg Oral BID Ethelda Chick, PA-C   25 mg at 04/27/22 2223   HYDROcodone-acetaminophen (NORCO) 7.5-325 MG per tablet 1-2 tablet  1-2 tablet Oral Q4H PRN Ethelda Chick, PA-C       HYDROcodone-acetaminophen (NORCO/VICODIN) 5-325 MG per tablet 1-2 tablet  1-2 tablet Oral Q4H PRN Ethelda Chick, PA-C   1 tablet at 04/28/22 1307   irbesartan (AVAPRO) tablet 150 mg  150 mg Oral Daily Ethelda Chick, PA-C   150 mg at 04/26/22 1036   lactated ringers infusion   Intravenous Continuous Ethelda Chick, PA-C 50 mL/hr at 04/28/22 0401 Infusion Verify at 04/28/22 0401   menthol-cetylpyridinium (CEPACOL) lozenge 3 mg  1 lozenge Oral PRN McBane, Maylene Roes, PA-C       Or   phenol (CHLORASEPTIC) mouth spray 1 spray  1 spray Mouth/Throat PRN McBane, Maylene Roes, PA-C       methocarbamol (ROBAXIN) tablet 500 mg  500 mg Oral Q6H PRN McBane, Maylene Roes, PA-C       Or   methocarbamol (ROBAXIN) 500 mg in dextrose 5 % 50 mL IVPB  500 mg Intravenous Q6H PRN McBane, Maylene Roes, PA-C       metoCLOPramide (REGLAN) tablet 5 mg  5 mg Oral Q8H PRN McBane, Maylene Roes, PA-C       Or   metoCLOPramide (REGLAN) injection 5 mg  5 mg Intravenous Q8H PRN McBane, Caroline N, PA-C       morphine (PF) 2 MG/ML injection 0.5-1 mg  0.5-1 mg Intravenous Q2H PRN McBane, Maylene Roes, PA-C       mupirocin  ointment (BACTROBAN) 2 % 1 Application  1 Application Nasal BID Ophelia Charter T, MD   1 Application at [phone]   naproxen (NAPROSYN) tablet 250 mg  250 mg Oral BID WC McBane, Caroline N, PA-C   250 mg at 04/28/22 0741   ondansetron (ZOFRAN) tablet 4 mg  4 mg Oral Q6H PRN Ethelda Chick, PA-C       Or   ondansetron (ZOFRAN) injection 4 mg  4 mg Intravenous Q6H PRN Ethelda Chick, PA-C   4 mg at 04/28/22 1317  pantoprazole (PROTONIX) EC tablet 40 mg  40 mg Oral BID AC McBane, Maylene Roes, PA-C   40 mg at 04/28/22 0741   polyethylene glycol (MIRALAX / GLYCOLAX) packet 17 g  17 g Oral Daily PRN McBane, Maylene Roes, PA-C       senna-docusate (Senokot-S) tablet 1 tablet  1 tablet Oral QHS PRN McBane, Maylene Roes, PA-C       sodium phosphate (FLEET) 7-19 GM/118ML enema 1 enema  1 enema Rectal Once PRN McBane, Maylene Roes, PA-C         Discharge Medications: Please see discharge summary for a list of discharge medications.  Relevant Imaging Results:  Relevant Lab Results:   Additional Information SS# [id number]  Lennart Pall, LCSW

## 2022-04-28 NOTE — TOC Initial Note (Signed)
Transition of Care [facility]) - Initial/Assessment Note    Patient Details  Name: Tina Frank MRN: [id number] Date of Birth: 01/23/1941  [facility]) CM/SW Contact:    Lennart Pall, LCSW Phone Number: 04/28/2022, 3:22 PM  Clinical Narrative:                 Met with pt and son today to review dc planning needs.  Pt aware that therapy has recommended possible SNF rehab and is agreeable with this plan/ son, also, in agreement.  We have discussed facility preferences and bed search has begun.  Expected Discharge Plan: Bath (vs. Home with [facility]) Barriers to Discharge: Insurance Authorization, Continued Medical Work up, SNF Pending bed offer   Patient Goals and CMS Choice Patient states their goals for this hospitalization and ongoing recovery are:: return home following rehab          Expected Discharge Plan and Services In-house Referral: Clinical Social Work   Post Acute Care Choice: [place] Living arrangements for the past 2 months: [place]                                      Prior Living Arrangements/Services Living arrangements for the past 2 months: [place] with:: Self Patient language and need for interpreter reviewed:: Yes Do you feel safe going back to the place where you live?: Yes      Need for Family Participation in Patient Care: No (Comment) Care giver support system in place?: Yes (comment)   Criminal Activity/Legal Involvement Pertinent to Current Situation/Hospitalization: No - Comment as needed  Activities of Daily Living Home Assistive Devices/Equipment: None ADL Screening (condition at time of admission) Patient's cognitive ability adequate to safely complete daily activities?: Yes Is the patient deaf or have difficulty hearing?: No Does the patient have difficulty seeing, even when wearing glasses/contacts?: No Does the patient have difficulty concentrating, remembering,  or making decisions?: No Patient able to express need for assistance with ADLs?: No Does the patient have difficulty dressing or bathing?: Yes Independently performs ADLs?: No Communication: Independent Dressing (OT): Needs assistance Is this a change from baseline?: Change from baseline, expected to last >3 days Grooming: Needs assistance Is this a change from baseline?: Change from baseline, expected to last >3 days Feeding: Needs assistance Is this a change from baseline?: Change from baseline, expected to last <3 days Bathing: Needs assistance Is this a change from baseline?: Change from baseline, expected to last >3 days Toileting: Needs assistance Is this a change from baseline?: Change from baseline, expected to last >3days In/Out Bed: Needs assistance Is this a change from baseline?: Change from baseline, expected to last <3 days Walks in Home: Independent Does the patient have difficulty walking or climbing stairs?: No Weakness of Legs: None Weakness of Arms/Hands: Right  Permission Sought/Granted Permission sought to share information with : Family Supports Permission granted to share information with : Yes, Verbal Permission Granted  Share Information with NAME: Kye Huntsberger     Permission granted to share info w Relationship: son  Permission granted to share info w Contact Information: [phone]  Emotional Assessment Appearance:: Appears stated age Attitude/Demeanor/Rapport: Gracious, Engaged Affect (typically observed): Accepting Orientation: : Oriented to Self, Oriented to Place, Oriented to  Time Alcohol / Substance Use: Not Applicable Psych Involvement: No (comment)  Admission diagnosis:  Humerus fracture [S42.309A] Fracture  of humeral shaft, right, closed [S42.301A] Fall from slip, trip, or stumble, initial encounter [W01.0XXA] Contusion of head, initial encounter [S00.93XA] Laceration of forehead, initial encounter [S01.81XA] Closed fracture of  proximal end of right humerus, unspecified fracture morphology, initial encounter [S42.201A] Patient Active Problem List   Diagnosis Date Noted   Humerus fracture 04/25/2022   Fracture of humeral shaft, right, closed 04/25/2022   Arthritis of left ankle 08/15/2020   Benign neoplasm of adrenal gland 08/15/2020   Cyst of kidney, acquired 08/15/2020   Gastroesophageal reflux disease 08/15/2020   Hardening of the aorta (main artery of the heart) ([place]) 08/15/2020   History of primary hyperparathyroidism 08/15/2020   Gout 08/15/2020   Joint pain 08/15/2020   Oral phase dysphagia 08/15/2020   Osteoporosis 08/15/2020   Prediabetes 08/15/2020   Presence of left artificial shoulder joint 08/15/2020   Pure hypercholesterolemia 08/15/2020   Visual disturbance 08/15/2020   Vitamin D deficiency 08/15/2020   Voice hoarseness 08/15/2020   Chronic arthropathy 04/25/2020   Close exposure to COVID-19 virus 11/08/2019   Decubitus skin ulcer 04/12/2019   Hypokalemia 03/15/2019   Prosthetic joint infection ([place]) 02/09/2019   Degenerative joint disease of left shoulder 02/14/2015   Pre-syncope 10/06/2013   Leukocytosis 10/06/2013   Hypotension 10/06/2013   Osteoarthritis 07/27/2013   HTN (hypertension) with goal to be determined 10/28/2011   DM (diabetes mellitus) ([place]) 10/28/2011   Hyperlipidemia 10/28/2011   Reflux esophagitis    Herpes zoster    Shingles    PCP:  Kathyrn Lass, MD Pharmacy:   [place], [place] - [address] AT [address] [place] [address] Phone: [phone] Fax: [phone]     Social Determinants of Health (SDOH) Social History: SDOH Screenings   Depression (PHQ2-9): Low Risk  (07/19/2019)  Tobacco Use: Medium Risk (04/25/2022)   SDOH Interventions:     Readmission Risk Interventions    04/28/2022    3:15 PM  Readmission Risk Prevention Plan  Transportation Screening Complete   PCP or Specialist Appt within 5-7 Days Complete  Home Care Screening Complete  Medication Review (RN CM) Complete

## 2022-04-28 NOTE — Evaluation (Signed)
Occupational Therapy Evaluation Patient Details Name: Tina Frank MRN: [id number] DOB: 1940-09-25 Today's Date: 04/28/2022   History of Present Illness Patient is a 82 year old female who presented on 2/16 to the hospital after a fall at home trying to get dressed resulting in dislocating right shoulder. On 2/18, Patient underwent right reverse total shoulder arthroplasty, and right humeral shaft open reduction internal fixation.  PMH: CKD, HTN, HLD, prosthetic joint infection L shoulder, GERD.   Clinical Impression   Patient is a 82 year old female who was admitted for above. Patient was living at home alone prior level. Currently, patient is mod A for bed mobility, total A for UB dress/bathing tasks, with limited use of LUE, increased dizziness with transitions and decreased functional activity tolerance. Patients son who was present in room reported they are looking to higher caregivers for patient 24/7 for the time being but they are not in place yet. Patient was able to maintain O2 93% or above on RA with education on continued use of incentive spirometer during the day. Patients blood pressure sitting EOB was 99/79 mmhg, unable to get reading in standing with 3 errors and seated BP after increased time with patient moving to get BP was 135/53 mmhg. Nurse made aware. Patient would continue to benefit from skilled OT services at this time while admitted and after d/c to address noted deficits in order to improve overall safety and independence in ADLs.        Recommendations for follow up therapy are one component of a multi-disciplinary discharge planning process, led by the attending physician.  Recommendations may be updated based on patient status, additional functional criteria and insurance authorization.   Follow Up Recommendations  Follow physician's recommendations for discharge plan and follow up therapies     Assistance Recommended at Discharge Frequent or constant  Supervision/Assistance  Patient can return home with the following A lot of help with bathing/dressing/bathroom;A lot of help with walking and/or transfers;Direct supervision/assist for financial management;Help with stairs or ramp for entrance;Assist for transportation;Direct supervision/assist for medications management;Assistance with cooking/housework    Functional Status Assessment  Patient has had a recent decline in their functional status and demonstrates the ability to make significant improvements in function in a reasonable and predictable amount of time.        Precautions / Restrictions Precautions Precautions: Shoulder Type of Shoulder Precautions: "Patient will be in a sling for 6 weeks, and can be used for ADLs while in the sling however we would not recommend any range of motion of the shoulder.  Okay for therapy to instruct on elbow and wrist range of motion with careful protection of the shoulder." Dr.Varkey's note 2/18. Shoulder Interventions: Timmothy Sours joy ultra sling;Shoulder sling/immobilizer;At all times Precaution Booklet Issued: Yes (comment) (handouts) Precaution Comments: NO ROM shoulder, ok hand wrist and elbow Restrictions Weight Bearing Restrictions: Yes RUE Weight Bearing: Non weight bearing      Mobility Bed Mobility Overal bed mobility: Needs Assistance Bed Mobility: Supine to Sit     Supine to sit: Mod assist     General bed mobility comments: with cues for proper hand and foot placement to maintain RUE restrictions to transition to EOB       Balance Overall balance assessment: Needs assistance Sitting-balance support: Single extremity supported Sitting balance-Leahy Scale: Fair     Standing balance support: Single extremity supported Standing balance-Leahy Scale: Poor Standing balance comment: reliant on external assist to mainain balance.  ADL either performed or assessed with clinical judgement   ADL Overall ADL's : Needs  assistance/impaired Eating/Feeding: Set up;Sitting   Grooming: Sitting;Moderate assistance   Upper Body Bathing: Sitting;Total assistance   Lower Body Bathing: Sitting/lateral leans;Total assistance   Upper Body Dressing : Sitting;Total assistance Upper Body Dressing Details (indicate cue type and reason): patient and son were educated on proper positioning of sling and importance of keeping it in place at all times. Lower Body Dressing: Sitting/lateral leans;Maximal assistance Lower Body Dressing Details (indicate cue type and reason): to slide on sneakers sitting EOB. Toilet Transfer: Moderate assistance;Stand-pivot Toilet Transfer Details (indicate cue type and reason): to recliner in room with patient reporting dizziness with transitions. patient's BP only able to be taken on L leg. Toileting- Clothing Manipulation and Hygiene: Total assistance;Sitting/lateral lean               Vision   Vision Assessment?: No apparent visual deficits            Pertinent Vitals/Pain Pain Assessment Pain Assessment: Faces Faces Pain Scale: Hurts little more Pain Location: R shoulder Pain Descriptors / Indicators: Discomfort, Grimacing Pain Intervention(s): Limited activity within patient's tolerance, Premedicated before session, Monitored during session     Hand Dominance Right   Extremity/Trunk Assessment Upper Extremity Assessment Upper Extremity Assessment: RUE deficits/detail;LUE deficits/detail RUE Deficits / Details: abduction pillow sling in place at thi s time. able to move digits minimally. LUE Deficits / Details: limited AROM of this UE with h/o shoulder replacement and other surgeries. elbow, hand and wrist WFL   Lower Extremity Assessment Lower Extremity Assessment: Defer to PT evaluation   Cervical / Trunk Assessment Cervical / Trunk Assessment: Normal;Other exceptions Cervical / Trunk Exceptions: patients large chest is noted to impact placement of sling    Communication Communication Communication: No difficulties   Cognition Arousal/Alertness: Awake/alert Behavior During Therapy: WFL for tasks assessed/performed Overall Cognitive Status: Within Functional Limits for tasks assessed       General Comments: noted to have some memory deficits with family present in room able to assist with PLOF. patient was cooperative and appropirate during session           Shoulder Instructions Shoulder Instructions Correct positioning of sling/immobilizer: Maximal assistance Sling wearing schedule (on at all times/off for ADL's): Supervision/safety;Caregiver independent with task Positioning of UE while sleeping: Minimal assistance    Home Living Family/patient expects to be discharged to:: Private residence Living Arrangements: Alone Available Help at Discharge: Family Type of Home: House Home Access: Stairs to enter Technical brewer of Steps: 3 Entrance Stairs-Rails: Right;Left Home Layout: One level     Bathroom Shower/Tub: Tub/shower unit         Home Equipment: Cane - single Barista (2 wheels);BSC/3in1          Prior Functioning/Environment Prior Level of Function : Independent/Modified Independent                        OT Problem List: Decreased strength;Decreased activity tolerance;Pain;Impaired UE functional use;Decreased safety awareness;Decreased knowledge of precautions;Decreased knowledge of use of DME or AE;Impaired balance (sitting and/or standing)      OT Treatment/Interventions: Self-care/ADL training;Energy conservation;Therapeutic exercise;DME and/or AE instruction;Therapeutic activities;Patient/family education;Balance training    OT Goals(Current goals can be found in the care plan section) Acute Rehab OT Goals Patient Stated Goal: to get RUE well OT Goal Formulation: With patient/family Time For Goal Achievement: 05/12/22 Potential to Achieve Goals: Fair  OT Frequency: Min  2X/week       AM-PAC OT "6 Clicks" Daily Activity     Outcome Measure Help from another person eating meals?: A Little Help from another person taking care of personal grooming?: A Lot Help from another person toileting, which includes using toliet, bedpan, or urinal?: A Lot Help from another person bathing (including washing, rinsing, drying)?: A Lot Help from another person to put on and taking off regular upper body clothing?: A Lot Help from another person to put on and taking off regular lower body clothing?: A Lot 6 Click Score: 13   End of Session Nurse Communication: Other (comment) (ok to see patient, updated on patients dizziness)  Activity Tolerance: Other (comment) (dizziness) Patient left: in chair;with call bell/phone within reach;with family/visitor present  OT Visit Diagnosis: Unsteadiness on feet (R26.81);Other abnormalities of gait and mobility (R26.89);Muscle weakness (generalized) (M62.81);Pain Pain - Right/Left: Right Pain - part of body: Shoulder                Time: [id number] OT Time Calculation (min): 37 min Charges:  OT General Charges $OT Visit: 1 Visit OT Evaluation $OT Eval Moderate Complexity: 1 Mod OT Treatments $Self Care/Home Management : 8-22 mins  Rennie Plowman, MS Acute Rehabilitation Department Office# [phone]   Willa Rough 04/28/2022, 10:26 AM

## 2022-04-29 ENCOUNTER — Encounter (HOSPITAL_COMMUNITY): Payer: Self-pay | Admitting: Orthopaedic Surgery

## 2022-04-29 LAB — CBC
HCT: 23.8 % — ABNORMAL LOW (ref 36.0–46.0)
Hemoglobin: 7.5 g/dL — ABNORMAL LOW (ref 12.0–15.0)
MCH: 30.2 pg (ref 26.0–34.0)
MCHC: 31.5 g/dL (ref 30.0–36.0)
MCV: 96 fL (ref 80.0–100.0)
Platelets: 137 10*3/uL — ABNORMAL LOW (ref 150–400)
RBC: 2.48 MIL/uL — ABNORMAL LOW (ref 3.87–5.11)
RDW: 14.9 % (ref 11.5–15.5)
WBC: 6.5 10*3/uL (ref 4.0–10.5)
nRBC: 0 % (ref 0.0–0.2)

## 2022-04-29 NOTE — Progress Notes (Signed)
   ORTHOPAEDIC PROGRESS NOTE  s/p Procedure(s): REVERSE SHOULDER ARTHROPLASTY OPEN REDUCTION INTERNAL FIXATION (ORIF) PROXIMAL HUMERUS FRACTURE  SUBJECTIVE: Patient slept very well last night. She had to take a pain pill. This did help her sleep. She says Tylenol has been managing her pain overall. She is asking about discharge. Discussed we are waiting on SNF bed offers. Hopefully we will have more information over the next day or two. She wishes to go to [facility] if possible. She states she had trouble with PT yesterday because she got dizzy and vomited. She is motivated to work with therapy and get better. Her sons provide her with good support.   No chest pain. No SOB. No nausea/vomiting. No other complaints.  OBJECTIVE: PE: General: NAD, resting comfortably in hospital bed RUE: Dressing CDI and sling well fitting. She endorses axillary nerve sensation but it is slightly altered compared to contralateral side. Difficult to test deltoid motor function. ROM not tested. She is able to flex and extend all fingers. She is able to flex and extend wrist. She has difficulty with distal motor function due to swelling but she is able to attempt. She does endorse distal sensory function in medial, radial, and ulnar distribution. Warm well perfused hand.    Vitals:   04/29/22 0519 04/29/22 0807  BP: (!) 104/49   Pulse: (!) 52 66  Resp: 17   Temp: 98 F (36.7 C)   SpO2: 100%      ASSESSMENT: Tina Frank is a 82 y.o. female POD#2  PLAN: Weightbearing: NWB RUE Insicional and dressing care: Reinforce dressings as needed Orthopedic device(s):  Sling Showering: Post-op day #2 with assistance VTE prophylaxis: Lovenox while inpatient. Will start aspirin post-op.  Pain control: PRN pain medications, minimize narcotics as able ABLA: Hgb 7.5 today. Continue to monitor Hypotension: IVF. Will hold patient's Lasix for now.  Follow - up plan: 2 weeks in office for suture removal and  x-ray Dispo: TBD. PT/OT recommending SNF. Patient has had two falls recently. At high risk for continued falls. Want to ensure safe discharge planning. Patient's family is agreeable to this. TOC following. Waiting on bed offers.   Contact information:  Weekdays 8-5 Dr. Ophelia Charter, Noemi Chapel PA-C, After hours and holidays please check [email or web address] for group call information for [facility]   Noemi Chapel, PA-C 04/29/2022

## 2022-04-29 NOTE — Progress Notes (Signed)
Physical Therapy Treatment Patient Details Name: Tina Frank MRN: [id number] DOB: [birth year] Today's Date: 04/29/2022   History of Present Illness Patient is a 82 year old female who presented on 2/16 to the hospital after a fall at home trying to get dressed resulting in dislocating right shoulder. On 2/18, Patient underwent right reverse total shoulder arthroplasty, and right humeral shaft open reduction internal fixation.  PMH: CKD, HTN, HLD, prosthetic joint infection L shoulder, GERD.    PT Comments    Upon PT entering room pt states she has been verbally calling out for help for "over an hour", she is sliding out of recliner and partially turned to the right placing pressure on RUE.  BPs checked--> reclined/near supine position in recliner with BP of 113/44, HR 60  sitting upright 132/65, HR 62 Pt complains of dizziness and weakness (BPs WNL) she  declines any attempt to stand d/t these issues. Pt was able to reposition self in chair with step by step cues and min assist. Positioned with pillows for comfort. Continue to recommend SNF    Recommendations for follow up therapy are one component of a multi-disciplinary discharge planning process, led by the attending physician.  Recommendations may be updated based on patient status, additional functional criteria and insurance authorization.  Follow Up Recommendations  Skilled nursing-short term rehab (<3 hours/day) Can patient physically be transported by private vehicle: No   Assistance Recommended at Discharge Frequent or constant Supervision/Assistance  Patient can return home with the following Assistance with cooking/housework;Assist for transportation;Help with stairs or ramp for entrance;A lot of help with walking and/or transfers;A lot of help with bathing/dressing/bathroom   Equipment Recommendations  None recommended by PT    Recommendations for Other Services       Precautions / Restrictions  Precautions Precautions: Shoulder Type of Shoulder Precautions: "Patient will be in a sling for 6 weeks, and can be used for ADLs while in the sling however we would not recommend any range of motion of the shoulder.  Okay for therapy to instruct on elbow and wrist range of motion with careful protection of the shoulder." Dr.Varkey's note 2/18. Shoulder Interventions: Timmothy Sours joy ultra sling;Shoulder sling/immobilizer;At all times Precaution Booklet Issued: Yes (comment) Precaution Comments: NO ROM shoulder, ok hand wrist and elbow Restrictions Weight Bearing Restrictions: Yes RUE Weight Bearing: Non weight bearing     Mobility  Bed Mobility                    Transfers                        Ambulation/Gait                   Stairs             Wheelchair Mobility    Modified Rankin (Stroke Patients Only)       Balance     Sitting balance-Leahy Scale: Fair                                      Cognition Arousal/Alertness: Awake/alert Behavior During Therapy: WFL for tasks assessed/performed Overall Cognitive Status: Within Functional Limits for tasks assessed                                 General Comments:  pt states she is "sleepy" closing eyes but verbally responsive        Exercises      General Comments        Pertinent Vitals/Pain Pain Assessment Pain Assessment: No/denies pain    Home Living                          Prior Function            PT Goals (current goals can now be found in the care plan section) Acute Rehab PT Goals PT Goal Formulation: With patient Time For Goal Achievement: 05/05/22 Potential to Achieve Goals: Good Progress towards PT goals: Not progressing toward goals - comment (medical issues-nausea, dizziness)    Frequency    Min 5X/week      PT Plan Current plan remains appropriate    Co-evaluation              AM-PAC PT "6 Clicks"  Mobility   Outcome Measure  Help needed turning from your back to your side while in a flat bed without using bedrails?: A Lot Help needed moving from lying on your back to sitting on the side of a flat bed without using bedrails?: A Lot Help needed moving to and from a bed to a chair (including a wheelchair)?: A Lot Help needed standing up from a chair using your arms (e.g., wheelchair or bedside chair)?: A Lot Help needed to walk in hospital room?: Total Help needed climbing 3-5 steps with a railing? : Total 6 Click Score: 10    End of Session Equipment Utilized During Treatment: Gait belt Activity Tolerance: Patient limited by fatigue;Other (comment) (dizziness, c/o "weakness") Patient left: with family/visitor present;in chair;with call bell/phone within reach;with chair alarm set   PT Visit Diagnosis: Other abnormalities of gait and mobility (R26.89);Difficulty in walking, not elsewhere classified (R26.2)     Time: [id number] PT Time Calculation (min) (ACUTE ONLY): 22 min  Charges:  $Therapeutic Activity: 8-22 mins                     Baxter Flattery, PT  Acute Rehab Dept [facility]) [phone]  [facility] Weekend Pager [facility] only)  [phone]  04/29/2022    [facility] 04/29/2022, 12:26 PM

## 2022-04-29 NOTE — Progress Notes (Signed)
PT TX NOTE  04/29/22 1500  PT Visit Information  Last PT Received On 04/29/22  Pt agreeable to attempt incr mobility and PT asked by RN to return to room to assist pt; NT assisting for safety d/t pt c/o severe dizziness placing her at risk for additional falls.  Continue to rec SNF   Assistance Needed +2 (safety)  History of Present Illness Patient is a 82 year old female who presented on 2/16 to the hospital after a fall at home trying to get dressed resulting in dislocating right shoulder. On 2/18, Patient underwent right reverse total shoulder arthroplasty, and right humeral shaft open reduction internal fixation.  PMH: CKD, HTN, HLD, prosthetic joint infection L shoulder, GERD.  Precautions  Precautions Shoulder  Type of Shoulder Precautions "Patient will be in a sling for 6 weeks, and can be used for ADLs while in the sling however we would not recommend any range of motion of the shoulder.  Okay for therapy to instruct on elbow and wrist range of motion with careful protection of the shoulder." Dr.Varkey's note 2/18.  Shoulder Interventions Don joy ultra sling;Shoulder sling/immobilizer;At all times  Precaution Comments NO ROM shoulder, ok hand wrist and elbow  Restrictions  RUE Weight Bearing NWB  Pain Assessment  Pain Assessment No/denies pain  Cognition  Arousal/Alertness Awake/alert  Behavior During Therapy WFL for tasks assessed/performed  Overall Cognitive Status Within Functional Limits for tasks assessed  Bed Mobility  Bed Mobility Sit to Supine  Sit to supine Supervision  General bed mobility comments for safety, incr time; cues to maintain NWB on RUE  Transfers  Overall transfer level Needs assistance  Equipment used Rolling walker (2 wheels)  Transfers Sit to/from Stand;Bed to chair/wheelchair/BSC  Sit to Stand Min assist;+2 safety/equipment  Bed to/from chair/wheelchair/BSC transfer type: Step pivot  Step pivot transfers Min assist;+2 safety/equipment  General  transfer comment verbal cues to power up through LEs  and push up with LUE; pt dizzy in sitting and standing, pivot chair to BSC, BSC to bed; single UE drive for  RW use of  L UE and on RW only  Balance  Sitting balance-Leahy Scale Fair  Standing balance support Single extremity supported  Standing balance-Leahy Scale Poor  Standing balance comment reliant on external assist to maintain balance;  Shoulder Instructions  Positioning of UE while sleeping Minimal assistance  PT - End of Session  Equipment Utilized During Treatment Gait belt  Activity Tolerance Patient limited by fatigue;Other (comment) (dizziness, c/o "weakness")  Patient left with family/visitor present;with call bell/phone within reach;in bed;with bed alarm set   PT - Assessment/Plan  PT Plan Current plan remains appropriate  PT Visit Diagnosis Other abnormalities of gait and mobility (R26.89);Difficulty in walking, not elsewhere classified (R26.2)  PT Frequency (ACUTE ONLY) Min 5X/week  Follow Up Recommendations Skilled nursing-short term rehab (<3 hours/day)  Can patient physically be transported by private vehicle No  Assistance recommended at discharge Frequent or constant Supervision/Assistance  Patient can return home with the following Assistance with cooking/housework;Assist for transportation;Help with stairs or ramp for entrance;A lot of help with walking and/or transfers;A lot of help with bathing/dressing/bathroom  PT equipment None recommended by PT  AM-PAC PT "6 Clicks" Mobility Outcome Measure (Version 2)  Help needed turning from your back to your side while in a flat bed without using bedrails? 2  Help needed moving from lying on your back to sitting on the side of a flat bed without using bedrails? 3  Help needed  moving to and from a bed to a chair (including a wheelchair)? 3  Help needed standing up from a chair using your arms (e.g., wheelchair or bedside chair)? 3  Help needed to walk in hospital room? 2   Help needed climbing 3-5 steps with a railing?  1  6 Click Score 14  Consider Recommendation of Discharge To: CIR/SNF/LTACH  PT Goal Progression  Progress towards PT goals Progressing toward goals  Acute Rehab PT Goals  PT Goal Formulation With patient  Time For Goal Achievement 05/05/22  Potential to Achieve Goals Good  PT Time Calculation  PT Start Time (ACUTE ONLY) 1314  PT Stop Time (ACUTE ONLY) 1331  PT Time Calculation (min) (ACUTE ONLY) 17 min  PT General Charges  $$ ACUTE PT VISIT 1 Visit  PT Treatments  $Therapeutic Activity 8-22 mins

## 2022-04-29 NOTE — TOC Progression Note (Signed)
Transition of Care [facility]) - Progression Note    Patient Details  Name: Tina Frank MRN: [id number] Date of Birth: Nov 02, 1940  Transition of [facility]) CM/SW Contact  Lennart Pall, LCSW Phone Number: 04/29/2022, 1:53 PM  Clinical Narrative:    Pt and family have accepted SNF bed offer from [facility] who has bed ready, however, pt not medically cleared for dc today.  Hopeful to receive medical clearance by tomorrow.  Insurance authorization begun.   Expected Discharge Plan: [place] (vs. Home with [facility]) Barriers to Discharge: Insurance Authorization, Continued Medical Work up, SNF Pending bed offer  Expected Discharge Plan and Services In-house Referral: Clinical Social Work   Post Acute Care Choice: [place] Living arrangements for the past 2 months: Single Family Home                                       Social Determinants of Health (SDOH) Interventions SDOH Screenings   Depression (PHQ2-9): Low Risk  (07/19/2019)  Tobacco Use: Medium Risk (04/25/2022)    Readmission Risk Interventions    04/28/2022    3:15 PM  Readmission Risk Prevention Plan  Transportation Screening Complete  PCP or Specialist Appt within 5-7 Days Complete  Home Care Screening Complete  Medication Review (RN CM) Complete

## 2022-04-29 NOTE — Progress Notes (Signed)
Occupational Therapy Treatment Patient Details Name: Tina Frank MRN: [id number] DOB: April 07, 1940 Today's Date: 04/29/2022   History of present illness Patient is a 82 year old female who presented on 2/16 to the hospital after a fall at home trying to get dressed resulting in dislocating right shoulder. On 2/18, Patient underwent right reverse total shoulder arthroplasty, and right humeral shaft open reduction internal fixation.  PMH: CKD, HTN, HLD, prosthetic joint infection L shoulder, GERD.   OT comments  Patient was noted to have continued dizziness during session. Patient was able to transfer from EOB to [facility] and then to recliner with min A and HHA. Patient noted to be anxious about her blood pressures with each reading. Patient's blood pressures noted below. Patient noted to be more lethargic during session with patient keeping eyes closed during most of conversations. Patient wanted to stay sitting up at end of session. Patient positioned in recliner with nurse aware.  Blood pressures:  Supine: 129/48 mmhg HR 58bpm  Sitting EOB: 180/71 mmhg HR 66bpm Standing 157/71 mmhg HR 63 bpm  O2 maintainined on RA 95% or Above.    Recommendations for follow up therapy are one component of a multi-disciplinary discharge planning process, led by the attending physician.  Recommendations may be updated based on patient status, additional functional criteria and insurance authorization.    Follow Up Recommendations  Skilled nursing-short term rehab (<3 hours/day)     Assistance Recommended at Discharge Frequent or constant Supervision/Assistance  Patient can return home with the following  A lot of help with bathing/dressing/bathroom;A lot of help with walking and/or transfers;Direct supervision/assist for financial management;Help with stairs or ramp for entrance;Assist for transportation;Direct supervision/assist for medications management;Assistance with cooking/housework         Precautions  / Restrictions Precautions Precautions: Shoulder Type of Shoulder Precautions: "Patient will be in a sling for 6 weeks, and can be used for ADLs while in the sling however we would not recommend any range of motion of the shoulder.  Okay for therapy to instruct on elbow and wrist range of motion with careful protection of the shoulder." Dr.Varkey's note 2/18. Shoulder Interventions: Timmothy Sours joy ultra sling;Shoulder sling/immobilizer;At all times Precaution Booklet Issued: Yes (comment) Precaution Comments: NO ROM shoulder, ok hand wrist and elbow Restrictions Weight Bearing Restrictions: Yes RUE Weight Bearing: Non weight bearing       Mobility Bed Mobility Overal bed mobility: Needs Assistance Bed Mobility: Supine to Sit     Supine to sit: Mod assist     General bed mobility comments: to transition trunk into midline on EOB          Balance Overall balance assessment: Needs assistance Sitting-balance support: Single extremity supported Sitting balance-Leahy Scale: Fair     Standing balance support: Single extremity supported Standing balance-Leahy Scale: Poor Standing balance comment: reliant on external assist to mainain balance;                           ADL either performed or assessed with clinical judgement   ADL Overall ADL's : Needs assistance/impaired         Upper Body Bathing: Adhering to UE precautions;Maximal assistance;Sitting Upper Body Bathing Details (indicate cue type and reason): patient was educated on UB bathing tasks with max A to complete Lower Body Bathing: Sitting/lateral leans;Maximal assistance Lower Body Bathing Details (indicate cue type and reason): able to help with washing parts of BLE seated on commode Upper Body Dressing :  Sitting;Total assistance Upper Body Dressing Details (indicate cue type and reason): with education on how to get shirts on and off verbal with use of hospital gown     Toilet Transfer: Minimal  assistance Toilet Transfer Details (indicate cue type and reason): HHA to transfer from bed to [facility] and then to recliner in room. Toileting- Clothing Manipulation and Hygiene: Sit to/from stand;Maximal assistance Toileting - Clothing Manipulation Details (indicate cue type and reason): patient needed HHA for standing balance with increased anxiousness during session about BP.            Extremity/Trunk Assessment Upper Extremity Assessment RUE Deficits / Details: able to move digits LUE Deficits / Details: able to ABduct about 60 degrees about 30 degrees FF, patient h/o shoulder surgery on this UE. elbow hand and wrist ROM WFL             Cognition Arousal/Alertness: Awake/alert Behavior During Therapy: WFL for tasks assessed/performed Overall Cognitive Status: Within Functional Limits for tasks assessed         General Comments: patient was cooperative but noted to close eyes and speak to therapist for most of session even with cues to keep eyes open                   Pertinent Vitals/ Pain       Pain Assessment Pain Assessment: 0-10 Faces Pain Scale: Hurts little more Pain Location: R shoulder Pain Descriptors / Indicators: Discomfort, Grimacing Pain Intervention(s): Limited activity within patient's tolerance, Monitored during session, Repositioned, Premedicated before session         Frequency  Min 2X/week        Progress Toward Goals  OT Goals(current goals can now be found in the care plan section)  Progress towards OT goals: Progressing toward goals     Plan         AM-PAC OT "6 Clicks" Daily Activity     Outcome Measure   Help from another person eating meals?: A Little Help from another person taking care of personal grooming?: A Lot Help from another person toileting, which includes using toliet, bedpan, or urinal?: A Lot Help from another person bathing (including washing, rinsing, drying)?: A Lot Help from another person to put on and  taking off regular upper body clothing?: A Lot Help from another person to put on and taking off regular lower body clothing?: A Lot 6 Click Score: 13    End of Session Equipment Utilized During Treatment: Gait belt;Other (comment) (sling)  OT Visit Diagnosis: Unsteadiness on feet (R26.81);Other abnormalities of gait and mobility (R26.89);Muscle weakness (generalized) (M62.81);Pain Pain - Right/Left: Right Pain - part of body: Shoulder   Activity Tolerance Other (comment) (nausea and dizziness)   Patient Left in chair;with call bell/phone within reach;with family/visitor present   Nurse Communication Other (comment) (ok to see patient, updated on patients nausea and positioning in recliner)        [id number] OT Time Calculation (min): 43 min  Charges: OT General Charges $OT Visit: 1 Visit OT Treatments $Self Care/Home Management : 38-52 mins  Rennie Plowman, MS Acute Rehabilitation Department Office# [phone]   Willa Rough 04/29/2022, 8:57 AM

## 2022-04-30 DIAGNOSIS — Z7401 Bed confinement status: Secondary | ICD-10-CM | POA: Diagnosis not present

## 2022-04-30 DIAGNOSIS — M19011 Primary osteoarthritis, right shoulder: Secondary | ICD-10-CM | POA: Diagnosis not present

## 2022-04-30 DIAGNOSIS — I10 Essential (primary) hypertension: Secondary | ICD-10-CM | POA: Diagnosis not present

## 2022-04-30 DIAGNOSIS — M25511 Pain in right shoulder: Secondary | ICD-10-CM | POA: Diagnosis not present

## 2022-04-30 DIAGNOSIS — R2689 Other abnormalities of gait and mobility: Secondary | ICD-10-CM | POA: Diagnosis not present

## 2022-04-30 DIAGNOSIS — R109 Unspecified abdominal pain: Secondary | ICD-10-CM | POA: Diagnosis not present

## 2022-04-30 DIAGNOSIS — R110 Nausea: Secondary | ICD-10-CM | POA: Diagnosis not present

## 2022-04-30 DIAGNOSIS — Z4789 Encounter for other orthopedic aftercare: Secondary | ICD-10-CM | POA: Diagnosis not present

## 2022-04-30 DIAGNOSIS — Z96611 Presence of right artificial shoulder joint: Secondary | ICD-10-CM | POA: Diagnosis not present

## 2022-04-30 DIAGNOSIS — I700 Atherosclerosis of aorta: Secondary | ICD-10-CM | POA: Diagnosis not present

## 2022-04-30 DIAGNOSIS — K5900 Constipation, unspecified: Secondary | ICD-10-CM | POA: Diagnosis not present

## 2022-04-30 DIAGNOSIS — I129 Hypertensive chronic kidney disease with stage 1 through stage 4 chronic kidney disease, or unspecified chronic kidney disease: Secondary | ICD-10-CM | POA: Diagnosis not present

## 2022-04-30 DIAGNOSIS — D3500 Benign neoplasm of unspecified adrenal gland: Secondary | ICD-10-CM | POA: Diagnosis not present

## 2022-04-30 DIAGNOSIS — N182 Chronic kidney disease, stage 2 (mild): Secondary | ICD-10-CM | POA: Diagnosis not present

## 2022-04-30 DIAGNOSIS — S42301D Unspecified fracture of shaft of humerus, right arm, subsequent encounter for fracture with routine healing: Secondary | ICD-10-CM | POA: Diagnosis not present

## 2022-04-30 DIAGNOSIS — E1122 Type 2 diabetes mellitus with diabetic chronic kidney disease: Secondary | ICD-10-CM | POA: Diagnosis not present

## 2022-04-30 DIAGNOSIS — R262 Difficulty in walking, not elsewhere classified: Secondary | ICD-10-CM | POA: Diagnosis not present

## 2022-04-30 DIAGNOSIS — G8918 Other acute postprocedural pain: Secondary | ICD-10-CM | POA: Diagnosis not present

## 2022-04-30 DIAGNOSIS — M6281 Muscle weakness (generalized): Secondary | ICD-10-CM | POA: Diagnosis not present

## 2022-04-30 DIAGNOSIS — K5909 Other constipation: Secondary | ICD-10-CM | POA: Diagnosis not present

## 2022-04-30 DIAGNOSIS — W19XXXD Unspecified fall, subsequent encounter: Secondary | ICD-10-CM | POA: Diagnosis not present

## 2022-04-30 DIAGNOSIS — R1311 Dysphagia, oral phase: Secondary | ICD-10-CM | POA: Diagnosis not present

## 2022-04-30 DIAGNOSIS — K219 Gastro-esophageal reflux disease without esophagitis: Secondary | ICD-10-CM | POA: Diagnosis not present

## 2022-04-30 LAB — BASIC METABOLIC PANEL
Anion gap: 8 (ref 5–15)
BUN: 35 mg/dL — ABNORMAL HIGH (ref 8–23)
CO2: 22 mmol/L (ref 22–32)
Calcium: 7.5 mg/dL — ABNORMAL LOW (ref 8.9–10.3)
Chloride: 107 mmol/L (ref 98–111)
Creatinine, Ser: 1.33 mg/dL — ABNORMAL HIGH (ref 0.44–1.00)
GFR, Estimated: 40 mL/min — ABNORMAL LOW (ref >60)
Glucose, Bld: 101 mg/dL — ABNORMAL HIGH (ref 70–99)
Potassium: 4 mmol/L (ref 3.5–5.1)
Sodium: 137 mmol/L (ref 135–145)

## 2022-04-30 LAB — CBC
HCT: 25.1 % — ABNORMAL LOW (ref 36.0–46.0)
Hemoglobin: 8 g/dL — ABNORMAL LOW (ref 12.0–15.0)
MCH: 30.7 pg (ref 26.0–34.0)
MCHC: 31.9 g/dL (ref 30.0–36.0)
MCV: 96.2 fL (ref 80.0–100.0)
Platelets: 159 10*3/uL (ref 150–400)
RBC: 2.61 MIL/uL — ABNORMAL LOW (ref 3.87–5.11)
RDW: 14.7 % (ref 11.5–15.5)
WBC: 5.7 10*3/uL (ref 4.0–10.5)
nRBC: 0 % (ref 0.0–0.2)

## 2022-04-30 MED ORDER — ACETAMINOPHEN 500 MG PO TABS: 1000.0000 mg | ORAL_TABLET | Freq: Three times a day (TID) | ORAL | 0 refills | Status: AC

## 2022-04-30 MED ORDER — ONDANSETRON HCL 4 MG PO TABS: 4.0000 mg | ORAL_TABLET | Freq: Three times a day (TID) | ORAL | 1 refills | Status: AC | PRN

## 2022-04-30 MED ORDER — ASPIRIN 81 MG PO CHEW: 81.0000 mg | CHEWABLE_TABLET | Freq: Two times a day (BID) | ORAL | 0 refills | Status: AC

## 2022-04-30 MED ORDER — OXYCODONE HCL 5 MG PO TABS: ORAL_TABLET | ORAL | 0 refills | Status: DC

## 2022-04-30 NOTE — Discharge Summary (Signed)
Patient ID: Tina Frank MRN: [id number] DOB/AGE: 08/03/1940 82 y.o.  Admit date: 04/25/2022 Discharge date: 04/30/2022  Admission Diagnoses:Right humeral shaft and proximal humerus fracture with right end-stage glenohumeral arthritis   Discharge Diagnoses:  Principal Problem:   Humerus fracture Active Problems:   Fracture of humeral shaft, right, closed   Past Medical History:  Diagnosis Date   CKD (chronic kidney disease), stage II    nephrologist-- dr Moshe Cipro  [facility])   Diabetes mellitus type 2, diet-controlled ([place])    followed by pcp---   (09-20-2019  per pt does not check blood sugar at home)   GERD (gastroesophageal reflux disease)    History of primary hyperparathyroidism    s/p  parathyroidectomy right inferior on 12-02-2012   Hyperlipidemia    Hypertension    followd by nephrologist----  (09-20-2019  per pt had stress test done some time ago , unsure when/ where, thinks told ok)   OA (osteoarthritis)    Osteoporosis    Prosthetic joint infection ([place])    followed by dr j. hatcher ([place])   left total shoulder arthroplasty 12/ 2016  ; 12/ 2020  revision with explant joint replacement and hemiarthroplasty;  completed 6 month antibiotic 05/ 2021     Procedures Performed:  - Right reverse total Shoulder Arthroplasty - Right humeral shaft open reduction internal fixation On 04/27/22 with Dr. Griffin Basil  Discharged Condition: stable  Hospital Course:  Patient presented  to [facility] on 02/23/23 after a fall where she landed on her right shoulder. She was trying to change her pants and fell. She landed on her right shoulder. She had immediate pain. She states she knew she did something was wrong after she fell. Patient is well known to [place]. She had a left revision with explantation of joint and antibiotic hemiarthroplasty with Dr. Griffin Basil in 2020. She did reasonable with this. She has been limited with both arms. She has known glenohumeral arthritis  of the right shoulder. She is right hand dominant.  She underwent surgery on 04/27/2022. This is an extremely complex case and we will attempt to perform an ORIF and a reverse shoulder arthroplasty simultaneously as the patient's joint has extensive erosive changes and is essentially autofused. She is at high risk of nonunion without trying to address the shoulder joint at the same time as the fracture. She tolerated procedure well. She was kept in the hospital for medial monitoring post op, pain control, and discharge planning. PT/OT recommended SNF. Patient agreeable to plan. Bed offer was available at [facility]. Patient to discharge to SNF on 04/30/22.Patient was instructed on specific activity restrictions and all questions were answered.   Consults: PT/OT  Significant Diagnostic Studies: No additional pertinent studies  Treatments: Surgery  Discharge Exam: General: NAD, resting comfortably in hospital bed RUE: Dressing CDI and sling well fitting. She endorses axillary nerve sensation but it is slightly altered compared to contralateral side. Difficult to test deltoid motor function. ROM not tested. She is able to flex and extend all fingers. She is able to flex and extend wrist. She has difficulty with distal motor function due to swelling but she is able to attempt. She does endorse distal sensory function in medial, radial, and ulnar distribution. Warm well perfused hand.   Disposition: Discharge disposition: 03-Skilled [place]      SNF Discharge Instructions     Call MD for:  redness, tenderness, or signs of infection (pain, swelling, redness, odor or green/yellow discharge around incision site)  Complete by: As directed    Call MD for:  severe uncontrolled pain   Complete by: As directed    Call MD for:  temperature >100.4   Complete by: As directed    Diet - low sodium heart healthy   Complete by: As directed       Allergies as of 04/30/2022       Reactions    Abaloparatide Other (See Comments)   "Burred vision, lightheaded" (patient does not recall this in 2024)   Mobic [meloxicam] Hypertension   Crestor [rosuvastatin] Other (See Comments)   Muscle aches   Norvasc [amlodipine Besylate] Swelling, Other (See Comments)   Ankle swelling   Simvastatin Other (See Comments)   Muscle aches   Sulfa Antibiotics Rash   Zetia [ezetimibe] Other (See Comments)   Muscle aches        Medication List     STOP taking these medications    ondansetron 4 MG disintegrating tablet Commonly known as: Zofran ODT   traMADol 50 MG tablet Commonly known as: Ultram       TAKE these medications    acetaminophen 500 MG tablet Commonly known as: TYLENOL Take 2 tablets (1,000 mg total) by mouth every 8 (eight) hours for 14 days. What changed: when to take this   allopurinol 300 MG tablet Commonly known as: ZYLOPRIM Take 300 mg by mouth daily.   amoxicillin 500 MG capsule Commonly known as: AMOXIL Take 2,000 mg by mouth See admin instructions. Take 2,000 mg by mouth one hour prior to dental procedures   aspirin 81 MG chewable tablet Commonly known as: Aspirin Childrens Chew 1 tablet (81 mg total) by mouth 2 (two) times daily. For 6 weeks for DVT prophylaxis after surgery   atorvastatin 10 MG tablet Commonly known as: LIPITOR Take 10 mg by mouth at bedtime.   calcium carbonate 500 MG chewable tablet Commonly known as: TUMS - dosed in mg elemental calcium Chew 1 tablet by mouth 3 (three) times daily as needed for indigestion or heartburn.   carvedilol 12.5 MG tablet Commonly known as: COREG Take 12.5 mg by mouth See admin instructions. Take 12.5 mg by mouth in the morning and at 5 PM   cloNIDine 0.1 MG tablet Commonly known as: CATAPRES Take 0.1 mg by mouth daily as needed (for hypertension- if systolic b/p is over 0000000).   denosumab 60 MG/ML Sosy injection Commonly known as: PROLIA Inject 60 mg into the skin every 6 (six) months.    famotidine 20 MG tablet Commonly known as: PEPCID Take by mouth.   furosemide 40 MG tablet Commonly known as: LASIX Take 40 mg by mouth See admin instructions. Take 40 mg by mouth in the morning and at 5 PM   hydrALAZINE 25 MG tablet Commonly known as: APRESOLINE Take 25 mg by mouth See admin instructions. Take 25 mg by mouth in the morning and at 5 PM   hydrALAZINE 50 MG tablet Commonly known as: APRESOLINE Take 50 mg by mouth See admin instructions. Take 50 mg by mouth in the morning and at 5 PM   ketoconazole 2 % cream Commonly known as: NIZORAL APPLY TOPICALLY TO FEET EVERY NIGHT   moxifloxacin 0.5 % ophthalmic solution Commonly known as: VIGAMOX Apply 1 drop to eye 4 (four) times daily.   ondansetron 4 MG tablet Commonly known as: Zofran Take 1 tablet (4 mg total) by mouth every 8 (eight) hours as needed for up to 7 days for nausea or vomiting.  oxyCODONE 5 MG immediate release tablet Commonly known as: Oxy IR/ROXICODONE Take 1 pills every 6 hrs as needed for severe pain only   pantoprazole 40 MG tablet Commonly known as: PROTONIX Take 40 mg by mouth See admin instructions. Take 40 mg by mouth in the morning and at 5 PM- BEFORE FOOD   prednisoLONE acetate 1 % ophthalmic suspension Commonly known as: PRED FORTE 1 drop 4 (four) times daily.   sucralfate 1 g tablet Commonly known as: Carafate Take 1 tablet (1 g total) by mouth 4 (four) times daily -  with meals and at bedtime.   telmisartan 80 MG tablet Commonly known as: MICARDIS Take 0.5 tablets (40 mg total) by mouth daily. What changed:  when to take this additional instructions   Vitamin D3 50 MCG (2000 UT) Tabs Generic drug: Cholecalciferol Take 2,000 Units by mouth every Monday, Wednesday, and Friday.   Voltaren 1 % Gel Generic drug: diclofenac Sodium Apply 2-4 g topically 4 (four) times daily as needed (to painful sites).        Contact information for follow-up providers     Hiram Gash,  MD In 1 week.   Specialty: Orthopedic Surgery Contact information: [address] [address] [phone]              Contact information for after-discharge care     Destination     [facility]REF[facility]ALF .   Service: Skilled Nursing Contact information: [address] [place], [place] 04/30/2022

## 2022-04-30 NOTE — Progress Notes (Signed)
Called [facility] to give report, Nurse will call back D FranklinRN

## 2022-04-30 NOTE — TOC Transition Note (Signed)
Transition of Care [facility]) - CM/SW Discharge Note   Patient Details  Name: Tina Frank MRN: [id number] Date of Birth: 05-Oct-1940  [facility] of [facility]) CM/SW Contact:  Lennart Pall, LCSW Phone Number: 04/30/2022, 1:23 PM   Clinical Narrative:    Pt has accepted SNF bed at [facility] and have received insurance authorization.  MD has cleared for dc to SNF today.  Pt and family aware and agreeable.  PTAR called at 1320.  RN to call report to [phone].  No further TOC needs.   Final next level of care: Skilled Nursing Facility Barriers to Discharge: Barriers Resolved   Patient Goals and CMS Choice      Discharge Placement                Patient chooses bed at: [facility] Patient to be transferred to facility by: [facility] Name of family member notified: son, Nicki Reaper Patient and family notified of of transfer: 04/30/22  Discharge Plan and Services Additional resources added to the After Visit Summary for   In-house Referral: Clinical Social Work   Post Acute Care Choice: [facility]          DME Arranged: N/A DME Agency: NA                  Social Determinants of Health (SDOH) Interventions SDOH Screenings   Depression (PHQ2-9): Low Risk  (07/19/2019)  Tobacco Use: Medium Risk (04/29/2022)     Readmission Risk Interventions    04/28/2022    3:15 PM  Readmission Risk Prevention Plan  Transportation Screening Complete  PCP or Specialist Appt within 5-7 Days Complete  Home Care Screening Complete  Medication Review (RN CM) Complete

## 2022-04-30 NOTE — Care Management Important Message (Signed)
Important Message  Patient Details IM Letter given. Name: Tina Frank MRN: [id number] Date of Birth: [birth year]   Medicare Important Message Given:  Yes     Kerin Salen 04/30/2022, 11:41 AM

## 2022-04-30 NOTE — Progress Notes (Signed)
Occupational Therapy Treatment Patient Details Name: Tina Frank MRN: [id number] DOB: 1940-09-21 Today's Date: 04/30/2022   History of present illness Patient is a 82 year old female who presented on 2/16 to the hospital after a fall at home trying to get dressed resulting in dislocating right shoulder. On 2/18, Patient underwent right reverse total shoulder arthroplasty, and right humeral shaft open reduction internal fixation.  PMH: CKD, HTN, HLD, prosthetic joint infection L shoulder, GERD.   OT comments  Patient was noted to have continued dizziness impacting session.patient was able to participate in bed mobility to sit EOB to adjust sling with onset of dizziness and patient declining to move further until after breakfast.  Patient remains unable to AROM elbow or forearm at this time with patient asking " am I doing it?"  With each attempt.  Patient was continuously educated with verbal and visual what each exercise is during session multiple times to ensure understanding. Patient plans to d/c to SNF on this date.Patient's discharge plan remains appropriate at this time. OT will continue to follow acutely. BP during session was 146/49 mmhg ( MAP 78) HR 58 bpm.      Recommendations for follow up therapy are one component of a multi-disciplinary discharge planning process, led by the attending physician.  Recommendations may be updated based on patient status, additional functional criteria and insurance authorization.    Follow Up Recommendations  Skilled nursing-short term rehab (<3 hours/day)     Assistance Recommended at Discharge Frequent or constant Supervision/Assistance  Patient can return home with the following  A lot of help with bathing/dressing/bathroom;A lot of help with walking and/or transfers;Direct supervision/assist for financial management;Help with stairs or ramp for entrance;Assist for transportation;Direct supervision/assist for medications management;Assistance with  cooking/housework   Equipment Recommendations  None recommended by OT    Recommendations for Other Services      Precautions / Restrictions Precautions Precautions: Shoulder Type of Shoulder Precautions: "Patient will be in a sling for 6 weeks, and can be used for ADLs while in the sling however we would not recommend any range of motion of the shoulder.  Okay for therapy to instruct on elbow and wrist range of motion with careful protection of the shoulder." Dr.Varkey's note 2/18. Shoulder Interventions: Timmothy Sours joy ultra sling;Shoulder sling/immobilizer;At all times Precaution Booklet Issued: Yes (comment) Precaution Comments: NO ROM shoulder, ok hand wrist and elbow Restrictions Weight Bearing Restrictions: Yes RUE Weight Bearing: Non weight bearing              ADL either performed or assessed with clinical judgement   ADL Overall ADL's : Needs assistance/impaired                           Toilet Transfer Details (indicate cue type and reason): patient was mod A for supine to sit on EOB with education on maintaining shoulder restrictions. patient was dizzy sitting EOB with BP of 146/49 mmhg (MAP 78) HR 58 bpm. patient declined to get out of bed further reporting she wanted to eat breakfast in bed at this time. nurse made aware.           General ADL Comments: Patient was TD for positioning of sling and pillows at bed level. patient was educated on importance of positioning to protect shoulder. patient verbalized understanding. patient was provided with 2nd copy of handouts as son had taken first set home. patient participated in hand and wrist ROM with patient able to  wiggle digits and minimally move wrist with patient reporting increased arthritis in this wrist prior level. currently, patient is unable to participate in supination/pronation or elbow AROM at this time.    Extremity/Trunk Assessment Upper Extremity Assessment RUE Deficits / Details: able to wiggle  digits and minimally move wrist, unable to complete supination/pronation or elbow flexion at this time.             Cognition Arousal/Alertness: Awake/alert Behavior During Therapy: WFL for tasks assessed/performed Overall Cognitive Status: Within Functional Limits for tasks assessed             General Comments: patient noted to have decreased ability to follow exercises on sheet with education provided as well. patient fixated on BP during session that was [facility].                   Pertinent Vitals/ Pain       Pain Assessment Pain Assessment: Faces Faces Pain Scale: Hurts little more Pain Location: R shoulder Pain Descriptors / Indicators: Discomfort, Grimacing Pain Intervention(s): Limited activity within patient's tolerance, Monitored during session, Premedicated before session, Repositioned         Frequency  Min 2X/week        Progress Toward Goals  OT Goals(current goals can now be found in the care plan section)  Progress towards OT goals: Not progressing toward goals - comment (dizziness is impacting participation in session)     Plan         AM-PAC OT "6 Clicks" Daily Activity     Outcome Measure   Help from another person eating meals?: A Little Help from another person taking care of personal grooming?: A Lot Help from another person toileting, which includes using toliet, bedpan, or urinal?: A Lot Help from another person bathing (including washing, rinsing, drying)?: A Lot Help from another person to put on and taking off regular upper body clothing?: A Lot Help from another person to put on and taking off regular lower body clothing?: A Lot 6 Click Score: 13    End of Session Equipment Utilized During Treatment: Other (comment) (abduction sling)  OT Visit Diagnosis: Unsteadiness on feet (R26.81);Other abnormalities of gait and mobility (R26.89);Muscle weakness (generalized) (M62.81);Pain Pain - Right/Left: Right Pain - part of body:  Shoulder   Activity Tolerance Other (comment) (dizziness limiting session)   Patient Left with call bell/phone within reach;in bed;with bed alarm set   Nurse Communication Other (comment) (patients BP during session and participation)        Time: [id number] OT Time Calculation (min): 31 min  Charges: OT General Charges $OT Visit: 1 Visit OT Treatments $Therapeutic Activity: 23-37 mins  Rennie Plowman, MS Acute Rehabilitation Department Office# [phone]   Willa Rough 04/30/2022, 9:59 AM

## 2022-04-30 NOTE — Progress Notes (Signed)
   ORTHOPAEDIC PROGRESS NOTE  s/p Procedure(s): REVERSE SHOULDER ARTHROPLASTY OPEN REDUCTION INTERNAL FIXATION (ORIF) PROXIMAL HUMERUS FRACTURE  SUBJECTIVE: She is happy about bed offer at [facility]. Slept well last night. Does feel nauseous when she gets to stand up and work with therapy. Pain is tolerable.    No chest pain. No SOB. No nausea/vomiting. No other complaints.  OBJECTIVE: PE: General: NAD, resting comfortably in hospital bed RUE: Dressing CDI and sling well fitting. She endorses axillary nerve sensation but it is slightly altered compared to contralateral side. Difficult to test deltoid motor function. ROM not tested. She is able to flex and extend all fingers. She is able to flex and extend wrist. She has difficulty with distal motor function due to swelling but she is able to attempt. She does endorse distal sensory function in medial, radial, and ulnar distribution. Warm well perfused hand.    Vitals:   04/29/22 2151 04/30/22 0622  BP: (!) 120/50 (!) 134/55  Pulse: 60 (!) 59  Resp: 17 17  Temp: 98.3 F (36.8 C) 98.4 F (36.9 C)  SpO2: 95% 96%     ASSESSMENT: Tina Frank is a 82 y.o. female POD#3  PLAN: Weightbearing: NWB RUE Insicional and dressing care: Reinforce dressings as needed Orthopedic device(s):  Sling Showering: Post-op day #2 with assistance VTE prophylaxis: Lovenox while inpatient. Will start aspirin post-op.  Pain control: PRN pain medications, minimize narcotics as able ABLA: Hgb 8.0 today. Continue to monitor Hypotension: IVF. Will hold patient's Lasix for now.  Follow - up plan: 2 weeks in office for suture removal and x-ray Dispo: TBD. PT/OT recommending SNF. Patient has had two falls recently. At high risk for continued falls. Want to ensure safe discharge planning. Patient's family is agreeable to this. TOC following. Received an offer for [facility]. Plan to discharge to SNF today. Discharged medications printed and placed in  patient's chart for discharge.   Contact information:  Weekdays 8-5 Dr. Ophelia Charter, Noemi Chapel PA-C, After hours and holidays please check [email or web address] for group call information for [facility]   Noemi Chapel, PA-C 04/30/2022

## 2022-05-01 DIAGNOSIS — M25511 Pain in right shoulder: Secondary | ICD-10-CM | POA: Diagnosis not present

## 2022-05-01 DIAGNOSIS — K5900 Constipation, unspecified: Secondary | ICD-10-CM | POA: Diagnosis not present

## 2022-05-01 DIAGNOSIS — R110 Nausea: Secondary | ICD-10-CM | POA: Diagnosis not present

## 2022-05-01 DIAGNOSIS — G8918 Other acute postprocedural pain: Secondary | ICD-10-CM | POA: Diagnosis not present

## 2022-05-13 DIAGNOSIS — M19011 Primary osteoarthritis, right shoulder: Secondary | ICD-10-CM | POA: Diagnosis not present

## 2022-05-19 DIAGNOSIS — M79632 Pain in left forearm: Secondary | ICD-10-CM | POA: Diagnosis not present

## 2022-05-19 DIAGNOSIS — M25512 Pain in left shoulder: Secondary | ICD-10-CM | POA: Diagnosis not present

## 2022-05-19 DIAGNOSIS — M25522 Pain in left elbow: Secondary | ICD-10-CM | POA: Diagnosis not present

## 2022-05-19 DIAGNOSIS — M25532 Pain in left wrist: Secondary | ICD-10-CM | POA: Diagnosis not present

## 2022-05-19 DIAGNOSIS — M79642 Pain in left hand: Secondary | ICD-10-CM | POA: Diagnosis not present

## 2022-05-19 DIAGNOSIS — M79622 Pain in left upper arm: Secondary | ICD-10-CM | POA: Diagnosis not present

## 2022-05-31 DIAGNOSIS — K219 Gastro-esophageal reflux disease without esophagitis: Secondary | ICD-10-CM | POA: Diagnosis not present

## 2022-05-31 DIAGNOSIS — N182 Chronic kidney disease, stage 2 (mild): Secondary | ICD-10-CM | POA: Diagnosis not present

## 2022-05-31 DIAGNOSIS — E785 Hyperlipidemia, unspecified: Secondary | ICD-10-CM | POA: Diagnosis not present

## 2022-05-31 DIAGNOSIS — I129 Hypertensive chronic kidney disease with stage 1 through stage 4 chronic kidney disease, or unspecified chronic kidney disease: Secondary | ICD-10-CM | POA: Diagnosis not present

## 2022-05-31 DIAGNOSIS — E892 Postprocedural hypoparathyroidism: Secondary | ICD-10-CM | POA: Diagnosis not present

## 2022-05-31 DIAGNOSIS — M80021D Age-related osteoporosis with current pathological fracture, right humerus, subsequent encounter for fracture with routine healing: Secondary | ICD-10-CM | POA: Diagnosis not present

## 2022-05-31 DIAGNOSIS — M1990 Unspecified osteoarthritis, unspecified site: Secondary | ICD-10-CM | POA: Diagnosis not present

## 2022-05-31 DIAGNOSIS — M1A09X0 Idiopathic chronic gout, multiple sites, without tophus (tophi): Secondary | ICD-10-CM | POA: Diagnosis not present

## 2022-05-31 DIAGNOSIS — E1122 Type 2 diabetes mellitus with diabetic chronic kidney disease: Secondary | ICD-10-CM | POA: Diagnosis not present

## 2022-06-03 DIAGNOSIS — M1990 Unspecified osteoarthritis, unspecified site: Secondary | ICD-10-CM | POA: Diagnosis not present

## 2022-06-03 DIAGNOSIS — I129 Hypertensive chronic kidney disease with stage 1 through stage 4 chronic kidney disease, or unspecified chronic kidney disease: Secondary | ICD-10-CM | POA: Diagnosis not present

## 2022-06-03 DIAGNOSIS — N182 Chronic kidney disease, stage 2 (mild): Secondary | ICD-10-CM | POA: Diagnosis not present

## 2022-06-03 DIAGNOSIS — K219 Gastro-esophageal reflux disease without esophagitis: Secondary | ICD-10-CM | POA: Diagnosis not present

## 2022-06-03 DIAGNOSIS — E1122 Type 2 diabetes mellitus with diabetic chronic kidney disease: Secondary | ICD-10-CM | POA: Diagnosis not present

## 2022-06-03 DIAGNOSIS — E892 Postprocedural hypoparathyroidism: Secondary | ICD-10-CM | POA: Diagnosis not present

## 2022-06-03 DIAGNOSIS — M80021D Age-related osteoporosis with current pathological fracture, right humerus, subsequent encounter for fracture with routine healing: Secondary | ICD-10-CM | POA: Diagnosis not present

## 2022-06-03 DIAGNOSIS — E785 Hyperlipidemia, unspecified: Secondary | ICD-10-CM | POA: Diagnosis not present

## 2022-06-03 DIAGNOSIS — M1A09X0 Idiopathic chronic gout, multiple sites, without tophus (tophi): Secondary | ICD-10-CM | POA: Diagnosis not present

## 2022-06-04 DIAGNOSIS — E1122 Type 2 diabetes mellitus with diabetic chronic kidney disease: Secondary | ICD-10-CM | POA: Diagnosis not present

## 2022-06-04 DIAGNOSIS — M80021D Age-related osteoporosis with current pathological fracture, right humerus, subsequent encounter for fracture with routine healing: Secondary | ICD-10-CM | POA: Diagnosis not present

## 2022-06-04 DIAGNOSIS — I129 Hypertensive chronic kidney disease with stage 1 through stage 4 chronic kidney disease, or unspecified chronic kidney disease: Secondary | ICD-10-CM | POA: Diagnosis not present

## 2022-06-04 DIAGNOSIS — M1990 Unspecified osteoarthritis, unspecified site: Secondary | ICD-10-CM | POA: Diagnosis not present

## 2022-06-04 DIAGNOSIS — M1A09X0 Idiopathic chronic gout, multiple sites, without tophus (tophi): Secondary | ICD-10-CM | POA: Diagnosis not present

## 2022-06-04 DIAGNOSIS — K219 Gastro-esophageal reflux disease without esophagitis: Secondary | ICD-10-CM | POA: Diagnosis not present

## 2022-06-04 DIAGNOSIS — E892 Postprocedural hypoparathyroidism: Secondary | ICD-10-CM | POA: Diagnosis not present

## 2022-06-04 DIAGNOSIS — E785 Hyperlipidemia, unspecified: Secondary | ICD-10-CM | POA: Diagnosis not present

## 2022-06-04 DIAGNOSIS — N182 Chronic kidney disease, stage 2 (mild): Secondary | ICD-10-CM | POA: Diagnosis not present

## 2022-06-06 DIAGNOSIS — M1A09X0 Idiopathic chronic gout, multiple sites, without tophus (tophi): Secondary | ICD-10-CM | POA: Diagnosis not present

## 2022-06-06 DIAGNOSIS — M1990 Unspecified osteoarthritis, unspecified site: Secondary | ICD-10-CM | POA: Diagnosis not present

## 2022-06-06 DIAGNOSIS — I129 Hypertensive chronic kidney disease with stage 1 through stage 4 chronic kidney disease, or unspecified chronic kidney disease: Secondary | ICD-10-CM | POA: Diagnosis not present

## 2022-06-06 DIAGNOSIS — M80021D Age-related osteoporosis with current pathological fracture, right humerus, subsequent encounter for fracture with routine healing: Secondary | ICD-10-CM | POA: Diagnosis not present

## 2022-06-06 DIAGNOSIS — N182 Chronic kidney disease, stage 2 (mild): Secondary | ICD-10-CM | POA: Diagnosis not present

## 2022-06-06 DIAGNOSIS — E1122 Type 2 diabetes mellitus with diabetic chronic kidney disease: Secondary | ICD-10-CM | POA: Diagnosis not present

## 2022-06-06 DIAGNOSIS — K219 Gastro-esophageal reflux disease without esophagitis: Secondary | ICD-10-CM | POA: Diagnosis not present

## 2022-06-06 DIAGNOSIS — E785 Hyperlipidemia, unspecified: Secondary | ICD-10-CM | POA: Diagnosis not present

## 2022-06-06 DIAGNOSIS — E892 Postprocedural hypoparathyroidism: Secondary | ICD-10-CM | POA: Diagnosis not present

## 2022-06-09 DIAGNOSIS — I129 Hypertensive chronic kidney disease with stage 1 through stage 4 chronic kidney disease, or unspecified chronic kidney disease: Secondary | ICD-10-CM | POA: Diagnosis not present

## 2022-06-09 DIAGNOSIS — M80021D Age-related osteoporosis with current pathological fracture, right humerus, subsequent encounter for fracture with routine healing: Secondary | ICD-10-CM | POA: Diagnosis not present

## 2022-06-09 DIAGNOSIS — N182 Chronic kidney disease, stage 2 (mild): Secondary | ICD-10-CM | POA: Diagnosis not present

## 2022-06-09 DIAGNOSIS — K219 Gastro-esophageal reflux disease without esophagitis: Secondary | ICD-10-CM | POA: Diagnosis not present

## 2022-06-09 DIAGNOSIS — E892 Postprocedural hypoparathyroidism: Secondary | ICD-10-CM | POA: Diagnosis not present

## 2022-06-09 DIAGNOSIS — M1A09X0 Idiopathic chronic gout, multiple sites, without tophus (tophi): Secondary | ICD-10-CM | POA: Diagnosis not present

## 2022-06-09 DIAGNOSIS — M1990 Unspecified osteoarthritis, unspecified site: Secondary | ICD-10-CM | POA: Diagnosis not present

## 2022-06-09 DIAGNOSIS — E785 Hyperlipidemia, unspecified: Secondary | ICD-10-CM | POA: Diagnosis not present

## 2022-06-09 DIAGNOSIS — E1122 Type 2 diabetes mellitus with diabetic chronic kidney disease: Secondary | ICD-10-CM | POA: Diagnosis not present

## 2022-06-10 DIAGNOSIS — M80021D Age-related osteoporosis with current pathological fracture, right humerus, subsequent encounter for fracture with routine healing: Secondary | ICD-10-CM | POA: Diagnosis not present

## 2022-06-10 DIAGNOSIS — M1A09X0 Idiopathic chronic gout, multiple sites, without tophus (tophi): Secondary | ICD-10-CM | POA: Diagnosis not present

## 2022-06-10 DIAGNOSIS — N182 Chronic kidney disease, stage 2 (mild): Secondary | ICD-10-CM | POA: Diagnosis not present

## 2022-06-10 DIAGNOSIS — M1990 Unspecified osteoarthritis, unspecified site: Secondary | ICD-10-CM | POA: Diagnosis not present

## 2022-06-10 DIAGNOSIS — K219 Gastro-esophageal reflux disease without esophagitis: Secondary | ICD-10-CM | POA: Diagnosis not present

## 2022-06-10 DIAGNOSIS — E1122 Type 2 diabetes mellitus with diabetic chronic kidney disease: Secondary | ICD-10-CM | POA: Diagnosis not present

## 2022-06-10 DIAGNOSIS — I129 Hypertensive chronic kidney disease with stage 1 through stage 4 chronic kidney disease, or unspecified chronic kidney disease: Secondary | ICD-10-CM | POA: Diagnosis not present

## 2022-06-10 DIAGNOSIS — M25512 Pain in left shoulder: Secondary | ICD-10-CM | POA: Diagnosis not present

## 2022-06-10 DIAGNOSIS — E785 Hyperlipidemia, unspecified: Secondary | ICD-10-CM | POA: Diagnosis not present

## 2022-06-10 DIAGNOSIS — E892 Postprocedural hypoparathyroidism: Secondary | ICD-10-CM | POA: Diagnosis not present

## 2022-06-11 DIAGNOSIS — K219 Gastro-esophageal reflux disease without esophagitis: Secondary | ICD-10-CM | POA: Diagnosis not present

## 2022-06-11 DIAGNOSIS — I129 Hypertensive chronic kidney disease with stage 1 through stage 4 chronic kidney disease, or unspecified chronic kidney disease: Secondary | ICD-10-CM | POA: Diagnosis not present

## 2022-06-11 DIAGNOSIS — M80021D Age-related osteoporosis with current pathological fracture, right humerus, subsequent encounter for fracture with routine healing: Secondary | ICD-10-CM | POA: Diagnosis not present

## 2022-06-11 DIAGNOSIS — E1122 Type 2 diabetes mellitus with diabetic chronic kidney disease: Secondary | ICD-10-CM | POA: Diagnosis not present

## 2022-06-11 DIAGNOSIS — M1A09X0 Idiopathic chronic gout, multiple sites, without tophus (tophi): Secondary | ICD-10-CM | POA: Diagnosis not present

## 2022-06-11 DIAGNOSIS — E785 Hyperlipidemia, unspecified: Secondary | ICD-10-CM | POA: Diagnosis not present

## 2022-06-11 DIAGNOSIS — N182 Chronic kidney disease, stage 2 (mild): Secondary | ICD-10-CM | POA: Diagnosis not present

## 2022-06-11 DIAGNOSIS — E892 Postprocedural hypoparathyroidism: Secondary | ICD-10-CM | POA: Diagnosis not present

## 2022-06-11 DIAGNOSIS — M1990 Unspecified osteoarthritis, unspecified site: Secondary | ICD-10-CM | POA: Diagnosis not present

## 2022-06-12 DIAGNOSIS — M19011 Primary osteoarthritis, right shoulder: Secondary | ICD-10-CM | POA: Diagnosis not present

## 2022-06-12 DIAGNOSIS — E1122 Type 2 diabetes mellitus with diabetic chronic kidney disease: Secondary | ICD-10-CM | POA: Diagnosis not present

## 2022-06-12 DIAGNOSIS — K219 Gastro-esophageal reflux disease without esophagitis: Secondary | ICD-10-CM | POA: Diagnosis not present

## 2022-06-12 DIAGNOSIS — I129 Hypertensive chronic kidney disease with stage 1 through stage 4 chronic kidney disease, or unspecified chronic kidney disease: Secondary | ICD-10-CM | POA: Diagnosis not present

## 2022-06-12 DIAGNOSIS — M1990 Unspecified osteoarthritis, unspecified site: Secondary | ICD-10-CM | POA: Diagnosis not present

## 2022-06-12 DIAGNOSIS — M80021D Age-related osteoporosis with current pathological fracture, right humerus, subsequent encounter for fracture with routine healing: Secondary | ICD-10-CM | POA: Diagnosis not present

## 2022-06-12 DIAGNOSIS — E892 Postprocedural hypoparathyroidism: Secondary | ICD-10-CM | POA: Diagnosis not present

## 2022-06-12 DIAGNOSIS — E785 Hyperlipidemia, unspecified: Secondary | ICD-10-CM | POA: Diagnosis not present

## 2022-06-12 DIAGNOSIS — N182 Chronic kidney disease, stage 2 (mild): Secondary | ICD-10-CM | POA: Diagnosis not present

## 2022-06-12 DIAGNOSIS — M1A09X0 Idiopathic chronic gout, multiple sites, without tophus (tophi): Secondary | ICD-10-CM | POA: Diagnosis not present

## 2022-06-16 DIAGNOSIS — I129 Hypertensive chronic kidney disease with stage 1 through stage 4 chronic kidney disease, or unspecified chronic kidney disease: Secondary | ICD-10-CM | POA: Diagnosis not present

## 2022-06-16 DIAGNOSIS — M1990 Unspecified osteoarthritis, unspecified site: Secondary | ICD-10-CM | POA: Diagnosis not present

## 2022-06-16 DIAGNOSIS — K219 Gastro-esophageal reflux disease without esophagitis: Secondary | ICD-10-CM | POA: Diagnosis not present

## 2022-06-16 DIAGNOSIS — E892 Postprocedural hypoparathyroidism: Secondary | ICD-10-CM | POA: Diagnosis not present

## 2022-06-16 DIAGNOSIS — E1122 Type 2 diabetes mellitus with diabetic chronic kidney disease: Secondary | ICD-10-CM | POA: Diagnosis not present

## 2022-06-16 DIAGNOSIS — E785 Hyperlipidemia, unspecified: Secondary | ICD-10-CM | POA: Diagnosis not present

## 2022-06-16 DIAGNOSIS — M80021D Age-related osteoporosis with current pathological fracture, right humerus, subsequent encounter for fracture with routine healing: Secondary | ICD-10-CM | POA: Diagnosis not present

## 2022-06-16 DIAGNOSIS — M1A09X0 Idiopathic chronic gout, multiple sites, without tophus (tophi): Secondary | ICD-10-CM | POA: Diagnosis not present

## 2022-06-16 DIAGNOSIS — N182 Chronic kidney disease, stage 2 (mild): Secondary | ICD-10-CM | POA: Diagnosis not present

## 2022-06-17 DIAGNOSIS — M1A09X0 Idiopathic chronic gout, multiple sites, without tophus (tophi): Secondary | ICD-10-CM | POA: Diagnosis not present

## 2022-06-17 DIAGNOSIS — K219 Gastro-esophageal reflux disease without esophagitis: Secondary | ICD-10-CM | POA: Diagnosis not present

## 2022-06-17 DIAGNOSIS — M80021D Age-related osteoporosis with current pathological fracture, right humerus, subsequent encounter for fracture with routine healing: Secondary | ICD-10-CM | POA: Diagnosis not present

## 2022-06-17 DIAGNOSIS — M1990 Unspecified osteoarthritis, unspecified site: Secondary | ICD-10-CM | POA: Diagnosis not present

## 2022-06-17 DIAGNOSIS — E785 Hyperlipidemia, unspecified: Secondary | ICD-10-CM | POA: Diagnosis not present

## 2022-06-17 DIAGNOSIS — E1122 Type 2 diabetes mellitus with diabetic chronic kidney disease: Secondary | ICD-10-CM | POA: Diagnosis not present

## 2022-06-17 DIAGNOSIS — E892 Postprocedural hypoparathyroidism: Secondary | ICD-10-CM | POA: Diagnosis not present

## 2022-06-17 DIAGNOSIS — N182 Chronic kidney disease, stage 2 (mild): Secondary | ICD-10-CM | POA: Diagnosis not present

## 2022-06-17 DIAGNOSIS — I129 Hypertensive chronic kidney disease with stage 1 through stage 4 chronic kidney disease, or unspecified chronic kidney disease: Secondary | ICD-10-CM | POA: Diagnosis not present

## 2022-06-18 DIAGNOSIS — M810 Age-related osteoporosis without current pathological fracture: Secondary | ICD-10-CM | POA: Diagnosis not present

## 2022-06-18 DIAGNOSIS — M80021D Age-related osteoporosis with current pathological fracture, right humerus, subsequent encounter for fracture with routine healing: Secondary | ICD-10-CM | POA: Diagnosis not present

## 2022-06-18 DIAGNOSIS — L89892 Pressure ulcer of other site, stage 2: Secondary | ICD-10-CM | POA: Diagnosis not present

## 2022-06-18 DIAGNOSIS — E559 Vitamin D deficiency, unspecified: Secondary | ICD-10-CM | POA: Diagnosis not present

## 2022-06-18 DIAGNOSIS — E785 Hyperlipidemia, unspecified: Secondary | ICD-10-CM | POA: Diagnosis not present

## 2022-06-18 DIAGNOSIS — E892 Postprocedural hypoparathyroidism: Secondary | ICD-10-CM | POA: Diagnosis not present

## 2022-06-18 DIAGNOSIS — N182 Chronic kidney disease, stage 2 (mild): Secondary | ICD-10-CM | POA: Diagnosis not present

## 2022-06-18 DIAGNOSIS — I10 Essential (primary) hypertension: Secondary | ICD-10-CM | POA: Diagnosis not present

## 2022-06-18 DIAGNOSIS — K219 Gastro-esophageal reflux disease without esophagitis: Secondary | ICD-10-CM | POA: Diagnosis not present

## 2022-06-18 DIAGNOSIS — E1122 Type 2 diabetes mellitus with diabetic chronic kidney disease: Secondary | ICD-10-CM | POA: Diagnosis not present

## 2022-06-18 DIAGNOSIS — I129 Hypertensive chronic kidney disease with stage 1 through stage 4 chronic kidney disease, or unspecified chronic kidney disease: Secondary | ICD-10-CM | POA: Diagnosis not present

## 2022-06-18 DIAGNOSIS — M1A09X0 Idiopathic chronic gout, multiple sites, without tophus (tophi): Secondary | ICD-10-CM | POA: Diagnosis not present

## 2022-06-18 DIAGNOSIS — R001 Bradycardia, unspecified: Secondary | ICD-10-CM | POA: Diagnosis not present

## 2022-06-18 DIAGNOSIS — R7303 Prediabetes: Secondary | ICD-10-CM | POA: Diagnosis not present

## 2022-06-18 DIAGNOSIS — M1990 Unspecified osteoarthritis, unspecified site: Secondary | ICD-10-CM | POA: Diagnosis not present

## 2022-06-18 DIAGNOSIS — E7800 Pure hypercholesterolemia, unspecified: Secondary | ICD-10-CM | POA: Diagnosis not present

## 2022-06-19 DIAGNOSIS — M1A09X0 Idiopathic chronic gout, multiple sites, without tophus (tophi): Secondary | ICD-10-CM | POA: Diagnosis not present

## 2022-06-19 DIAGNOSIS — M80021D Age-related osteoporosis with current pathological fracture, right humerus, subsequent encounter for fracture with routine healing: Secondary | ICD-10-CM | POA: Diagnosis not present

## 2022-06-19 DIAGNOSIS — E1122 Type 2 diabetes mellitus with diabetic chronic kidney disease: Secondary | ICD-10-CM | POA: Diagnosis not present

## 2022-06-19 DIAGNOSIS — M1990 Unspecified osteoarthritis, unspecified site: Secondary | ICD-10-CM | POA: Diagnosis not present

## 2022-06-19 DIAGNOSIS — E892 Postprocedural hypoparathyroidism: Secondary | ICD-10-CM | POA: Diagnosis not present

## 2022-06-19 DIAGNOSIS — N182 Chronic kidney disease, stage 2 (mild): Secondary | ICD-10-CM | POA: Diagnosis not present

## 2022-06-19 DIAGNOSIS — I129 Hypertensive chronic kidney disease with stage 1 through stage 4 chronic kidney disease, or unspecified chronic kidney disease: Secondary | ICD-10-CM | POA: Diagnosis not present

## 2022-06-19 DIAGNOSIS — K219 Gastro-esophageal reflux disease without esophagitis: Secondary | ICD-10-CM | POA: Diagnosis not present

## 2022-06-19 DIAGNOSIS — E785 Hyperlipidemia, unspecified: Secondary | ICD-10-CM | POA: Diagnosis not present

## 2022-06-20 ENCOUNTER — Ambulatory Visit: Payer: Medicare PPO | Admitting: Podiatry

## 2022-06-24 DIAGNOSIS — E785 Hyperlipidemia, unspecified: Secondary | ICD-10-CM | POA: Diagnosis not present

## 2022-06-24 DIAGNOSIS — E892 Postprocedural hypoparathyroidism: Secondary | ICD-10-CM | POA: Diagnosis not present

## 2022-06-24 DIAGNOSIS — M1990 Unspecified osteoarthritis, unspecified site: Secondary | ICD-10-CM | POA: Diagnosis not present

## 2022-06-24 DIAGNOSIS — M1A09X0 Idiopathic chronic gout, multiple sites, without tophus (tophi): Secondary | ICD-10-CM | POA: Diagnosis not present

## 2022-06-24 DIAGNOSIS — E1122 Type 2 diabetes mellitus with diabetic chronic kidney disease: Secondary | ICD-10-CM | POA: Diagnosis not present

## 2022-06-24 DIAGNOSIS — K219 Gastro-esophageal reflux disease without esophagitis: Secondary | ICD-10-CM | POA: Diagnosis not present

## 2022-06-24 DIAGNOSIS — M80021D Age-related osteoporosis with current pathological fracture, right humerus, subsequent encounter for fracture with routine healing: Secondary | ICD-10-CM | POA: Diagnosis not present

## 2022-06-24 DIAGNOSIS — I129 Hypertensive chronic kidney disease with stage 1 through stage 4 chronic kidney disease, or unspecified chronic kidney disease: Secondary | ICD-10-CM | POA: Diagnosis not present

## 2022-06-24 DIAGNOSIS — N182 Chronic kidney disease, stage 2 (mild): Secondary | ICD-10-CM | POA: Diagnosis not present

## 2022-06-25 DIAGNOSIS — M1A09X0 Idiopathic chronic gout, multiple sites, without tophus (tophi): Secondary | ICD-10-CM | POA: Diagnosis not present

## 2022-06-25 DIAGNOSIS — M80021D Age-related osteoporosis with current pathological fracture, right humerus, subsequent encounter for fracture with routine healing: Secondary | ICD-10-CM | POA: Diagnosis not present

## 2022-06-25 DIAGNOSIS — M1990 Unspecified osteoarthritis, unspecified site: Secondary | ICD-10-CM | POA: Diagnosis not present

## 2022-06-25 DIAGNOSIS — E892 Postprocedural hypoparathyroidism: Secondary | ICD-10-CM | POA: Diagnosis not present

## 2022-06-25 DIAGNOSIS — E785 Hyperlipidemia, unspecified: Secondary | ICD-10-CM | POA: Diagnosis not present

## 2022-06-25 DIAGNOSIS — N182 Chronic kidney disease, stage 2 (mild): Secondary | ICD-10-CM | POA: Diagnosis not present

## 2022-06-25 DIAGNOSIS — K219 Gastro-esophageal reflux disease without esophagitis: Secondary | ICD-10-CM | POA: Diagnosis not present

## 2022-06-25 DIAGNOSIS — E1122 Type 2 diabetes mellitus with diabetic chronic kidney disease: Secondary | ICD-10-CM | POA: Diagnosis not present

## 2022-06-25 DIAGNOSIS — I129 Hypertensive chronic kidney disease with stage 1 through stage 4 chronic kidney disease, or unspecified chronic kidney disease: Secondary | ICD-10-CM | POA: Diagnosis not present

## 2022-06-26 DIAGNOSIS — M80021D Age-related osteoporosis with current pathological fracture, right humerus, subsequent encounter for fracture with routine healing: Secondary | ICD-10-CM | POA: Diagnosis not present

## 2022-06-26 DIAGNOSIS — N182 Chronic kidney disease, stage 2 (mild): Secondary | ICD-10-CM | POA: Diagnosis not present

## 2022-06-26 DIAGNOSIS — E892 Postprocedural hypoparathyroidism: Secondary | ICD-10-CM | POA: Diagnosis not present

## 2022-06-26 DIAGNOSIS — K219 Gastro-esophageal reflux disease without esophagitis: Secondary | ICD-10-CM | POA: Diagnosis not present

## 2022-06-26 DIAGNOSIS — E785 Hyperlipidemia, unspecified: Secondary | ICD-10-CM | POA: Diagnosis not present

## 2022-06-26 DIAGNOSIS — E1122 Type 2 diabetes mellitus with diabetic chronic kidney disease: Secondary | ICD-10-CM | POA: Diagnosis not present

## 2022-06-26 DIAGNOSIS — I129 Hypertensive chronic kidney disease with stage 1 through stage 4 chronic kidney disease, or unspecified chronic kidney disease: Secondary | ICD-10-CM | POA: Diagnosis not present

## 2022-06-26 DIAGNOSIS — M1A09X0 Idiopathic chronic gout, multiple sites, without tophus (tophi): Secondary | ICD-10-CM | POA: Diagnosis not present

## 2022-06-26 DIAGNOSIS — M1990 Unspecified osteoarthritis, unspecified site: Secondary | ICD-10-CM | POA: Diagnosis not present

## 2022-06-30 DIAGNOSIS — M1A09X0 Idiopathic chronic gout, multiple sites, without tophus (tophi): Secondary | ICD-10-CM | POA: Diagnosis not present

## 2022-06-30 DIAGNOSIS — M1990 Unspecified osteoarthritis, unspecified site: Secondary | ICD-10-CM | POA: Diagnosis not present

## 2022-06-30 DIAGNOSIS — E892 Postprocedural hypoparathyroidism: Secondary | ICD-10-CM | POA: Diagnosis not present

## 2022-06-30 DIAGNOSIS — N182 Chronic kidney disease, stage 2 (mild): Secondary | ICD-10-CM | POA: Diagnosis not present

## 2022-06-30 DIAGNOSIS — E785 Hyperlipidemia, unspecified: Secondary | ICD-10-CM | POA: Diagnosis not present

## 2022-06-30 DIAGNOSIS — I129 Hypertensive chronic kidney disease with stage 1 through stage 4 chronic kidney disease, or unspecified chronic kidney disease: Secondary | ICD-10-CM | POA: Diagnosis not present

## 2022-06-30 DIAGNOSIS — M80021D Age-related osteoporosis with current pathological fracture, right humerus, subsequent encounter for fracture with routine healing: Secondary | ICD-10-CM | POA: Diagnosis not present

## 2022-06-30 DIAGNOSIS — E1122 Type 2 diabetes mellitus with diabetic chronic kidney disease: Secondary | ICD-10-CM | POA: Diagnosis not present

## 2022-06-30 DIAGNOSIS — K219 Gastro-esophageal reflux disease without esophagitis: Secondary | ICD-10-CM | POA: Diagnosis not present

## 2022-07-01 DIAGNOSIS — E1122 Type 2 diabetes mellitus with diabetic chronic kidney disease: Secondary | ICD-10-CM | POA: Diagnosis not present

## 2022-07-01 DIAGNOSIS — M1A09X0 Idiopathic chronic gout, multiple sites, without tophus (tophi): Secondary | ICD-10-CM | POA: Diagnosis not present

## 2022-07-01 DIAGNOSIS — N182 Chronic kidney disease, stage 2 (mild): Secondary | ICD-10-CM | POA: Diagnosis not present

## 2022-07-01 DIAGNOSIS — M80021D Age-related osteoporosis with current pathological fracture, right humerus, subsequent encounter for fracture with routine healing: Secondary | ICD-10-CM | POA: Diagnosis not present

## 2022-07-01 DIAGNOSIS — I129 Hypertensive chronic kidney disease with stage 1 through stage 4 chronic kidney disease, or unspecified chronic kidney disease: Secondary | ICD-10-CM | POA: Diagnosis not present

## 2022-07-01 DIAGNOSIS — E892 Postprocedural hypoparathyroidism: Secondary | ICD-10-CM | POA: Diagnosis not present

## 2022-07-01 DIAGNOSIS — K219 Gastro-esophageal reflux disease without esophagitis: Secondary | ICD-10-CM | POA: Diagnosis not present

## 2022-07-01 DIAGNOSIS — M1990 Unspecified osteoarthritis, unspecified site: Secondary | ICD-10-CM | POA: Diagnosis not present

## 2022-07-01 DIAGNOSIS — E785 Hyperlipidemia, unspecified: Secondary | ICD-10-CM | POA: Diagnosis not present

## 2022-07-03 DIAGNOSIS — E785 Hyperlipidemia, unspecified: Secondary | ICD-10-CM | POA: Diagnosis not present

## 2022-07-03 DIAGNOSIS — I129 Hypertensive chronic kidney disease with stage 1 through stage 4 chronic kidney disease, or unspecified chronic kidney disease: Secondary | ICD-10-CM | POA: Diagnosis not present

## 2022-07-03 DIAGNOSIS — E1122 Type 2 diabetes mellitus with diabetic chronic kidney disease: Secondary | ICD-10-CM | POA: Diagnosis not present

## 2022-07-03 DIAGNOSIS — M1990 Unspecified osteoarthritis, unspecified site: Secondary | ICD-10-CM | POA: Diagnosis not present

## 2022-07-03 DIAGNOSIS — M1A09X0 Idiopathic chronic gout, multiple sites, without tophus (tophi): Secondary | ICD-10-CM | POA: Diagnosis not present

## 2022-07-03 DIAGNOSIS — N182 Chronic kidney disease, stage 2 (mild): Secondary | ICD-10-CM | POA: Diagnosis not present

## 2022-07-03 DIAGNOSIS — K219 Gastro-esophageal reflux disease without esophagitis: Secondary | ICD-10-CM | POA: Diagnosis not present

## 2022-07-03 DIAGNOSIS — M80021D Age-related osteoporosis with current pathological fracture, right humerus, subsequent encounter for fracture with routine healing: Secondary | ICD-10-CM | POA: Diagnosis not present

## 2022-07-03 DIAGNOSIS — E892 Postprocedural hypoparathyroidism: Secondary | ICD-10-CM | POA: Diagnosis not present

## 2022-07-07 ENCOUNTER — Ambulatory Visit: Payer: Medicare PPO | Admitting: Podiatry

## 2022-07-07 DIAGNOSIS — M1A09X0 Idiopathic chronic gout, multiple sites, without tophus (tophi): Secondary | ICD-10-CM | POA: Diagnosis not present

## 2022-07-07 DIAGNOSIS — I129 Hypertensive chronic kidney disease with stage 1 through stage 4 chronic kidney disease, or unspecified chronic kidney disease: Secondary | ICD-10-CM | POA: Diagnosis not present

## 2022-07-07 DIAGNOSIS — E785 Hyperlipidemia, unspecified: Secondary | ICD-10-CM | POA: Diagnosis not present

## 2022-07-07 DIAGNOSIS — N182 Chronic kidney disease, stage 2 (mild): Secondary | ICD-10-CM | POA: Diagnosis not present

## 2022-07-07 DIAGNOSIS — E1122 Type 2 diabetes mellitus with diabetic chronic kidney disease: Secondary | ICD-10-CM | POA: Diagnosis not present

## 2022-07-07 DIAGNOSIS — M1990 Unspecified osteoarthritis, unspecified site: Secondary | ICD-10-CM | POA: Diagnosis not present

## 2022-07-07 DIAGNOSIS — M80021D Age-related osteoporosis with current pathological fracture, right humerus, subsequent encounter for fracture with routine healing: Secondary | ICD-10-CM | POA: Diagnosis not present

## 2022-07-07 DIAGNOSIS — E892 Postprocedural hypoparathyroidism: Secondary | ICD-10-CM | POA: Diagnosis not present

## 2022-07-07 DIAGNOSIS — K219 Gastro-esophageal reflux disease without esophagitis: Secondary | ICD-10-CM | POA: Diagnosis not present

## 2022-07-08 DIAGNOSIS — R001 Bradycardia, unspecified: Secondary | ICD-10-CM | POA: Diagnosis not present

## 2022-07-08 DIAGNOSIS — M170 Bilateral primary osteoarthritis of knee: Secondary | ICD-10-CM | POA: Diagnosis not present

## 2022-07-08 DIAGNOSIS — R9431 Abnormal electrocardiogram [ECG] [EKG]: Secondary | ICD-10-CM | POA: Diagnosis not present

## 2022-07-08 DIAGNOSIS — Z6823 Body mass index (BMI) 23.0-23.9, adult: Secondary | ICD-10-CM | POA: Diagnosis not present

## 2022-07-08 DIAGNOSIS — E876 Hypokalemia: Secondary | ICD-10-CM | POA: Diagnosis not present

## 2022-07-09 DIAGNOSIS — M1990 Unspecified osteoarthritis, unspecified site: Secondary | ICD-10-CM | POA: Diagnosis not present

## 2022-07-09 DIAGNOSIS — E1122 Type 2 diabetes mellitus with diabetic chronic kidney disease: Secondary | ICD-10-CM | POA: Diagnosis not present

## 2022-07-09 DIAGNOSIS — M1A09X0 Idiopathic chronic gout, multiple sites, without tophus (tophi): Secondary | ICD-10-CM | POA: Diagnosis not present

## 2022-07-09 DIAGNOSIS — E785 Hyperlipidemia, unspecified: Secondary | ICD-10-CM | POA: Diagnosis not present

## 2022-07-09 DIAGNOSIS — I129 Hypertensive chronic kidney disease with stage 1 through stage 4 chronic kidney disease, or unspecified chronic kidney disease: Secondary | ICD-10-CM | POA: Diagnosis not present

## 2022-07-09 DIAGNOSIS — N182 Chronic kidney disease, stage 2 (mild): Secondary | ICD-10-CM | POA: Diagnosis not present

## 2022-07-09 DIAGNOSIS — E892 Postprocedural hypoparathyroidism: Secondary | ICD-10-CM | POA: Diagnosis not present

## 2022-07-09 DIAGNOSIS — M80021D Age-related osteoporosis with current pathological fracture, right humerus, subsequent encounter for fracture with routine healing: Secondary | ICD-10-CM | POA: Diagnosis not present

## 2022-07-09 DIAGNOSIS — K219 Gastro-esophageal reflux disease without esophagitis: Secondary | ICD-10-CM | POA: Diagnosis not present

## 2022-07-10 DIAGNOSIS — N182 Chronic kidney disease, stage 2 (mild): Secondary | ICD-10-CM | POA: Diagnosis not present

## 2022-07-10 DIAGNOSIS — E892 Postprocedural hypoparathyroidism: Secondary | ICD-10-CM | POA: Diagnosis not present

## 2022-07-10 DIAGNOSIS — I129 Hypertensive chronic kidney disease with stage 1 through stage 4 chronic kidney disease, or unspecified chronic kidney disease: Secondary | ICD-10-CM | POA: Diagnosis not present

## 2022-07-10 DIAGNOSIS — M80021D Age-related osteoporosis with current pathological fracture, right humerus, subsequent encounter for fracture with routine healing: Secondary | ICD-10-CM | POA: Diagnosis not present

## 2022-07-10 DIAGNOSIS — E1122 Type 2 diabetes mellitus with diabetic chronic kidney disease: Secondary | ICD-10-CM | POA: Diagnosis not present

## 2022-07-10 DIAGNOSIS — M1A09X0 Idiopathic chronic gout, multiple sites, without tophus (tophi): Secondary | ICD-10-CM | POA: Diagnosis not present

## 2022-07-10 DIAGNOSIS — E785 Hyperlipidemia, unspecified: Secondary | ICD-10-CM | POA: Diagnosis not present

## 2022-07-10 DIAGNOSIS — M1990 Unspecified osteoarthritis, unspecified site: Secondary | ICD-10-CM | POA: Diagnosis not present

## 2022-07-10 DIAGNOSIS — K219 Gastro-esophageal reflux disease without esophagitis: Secondary | ICD-10-CM | POA: Diagnosis not present

## 2022-07-11 DIAGNOSIS — K219 Gastro-esophageal reflux disease without esophagitis: Secondary | ICD-10-CM | POA: Diagnosis not present

## 2022-07-11 DIAGNOSIS — E1122 Type 2 diabetes mellitus with diabetic chronic kidney disease: Secondary | ICD-10-CM | POA: Diagnosis not present

## 2022-07-11 DIAGNOSIS — M1A09X0 Idiopathic chronic gout, multiple sites, without tophus (tophi): Secondary | ICD-10-CM | POA: Diagnosis not present

## 2022-07-11 DIAGNOSIS — I129 Hypertensive chronic kidney disease with stage 1 through stage 4 chronic kidney disease, or unspecified chronic kidney disease: Secondary | ICD-10-CM | POA: Diagnosis not present

## 2022-07-11 DIAGNOSIS — M1990 Unspecified osteoarthritis, unspecified site: Secondary | ICD-10-CM | POA: Diagnosis not present

## 2022-07-11 DIAGNOSIS — M80021D Age-related osteoporosis with current pathological fracture, right humerus, subsequent encounter for fracture with routine healing: Secondary | ICD-10-CM | POA: Diagnosis not present

## 2022-07-11 DIAGNOSIS — E892 Postprocedural hypoparathyroidism: Secondary | ICD-10-CM | POA: Diagnosis not present

## 2022-07-11 DIAGNOSIS — E785 Hyperlipidemia, unspecified: Secondary | ICD-10-CM | POA: Diagnosis not present

## 2022-07-11 DIAGNOSIS — N182 Chronic kidney disease, stage 2 (mild): Secondary | ICD-10-CM | POA: Diagnosis not present

## 2022-07-14 DIAGNOSIS — E892 Postprocedural hypoparathyroidism: Secondary | ICD-10-CM | POA: Diagnosis not present

## 2022-07-14 DIAGNOSIS — K219 Gastro-esophageal reflux disease without esophagitis: Secondary | ICD-10-CM | POA: Diagnosis not present

## 2022-07-14 DIAGNOSIS — M1990 Unspecified osteoarthritis, unspecified site: Secondary | ICD-10-CM | POA: Diagnosis not present

## 2022-07-14 DIAGNOSIS — N182 Chronic kidney disease, stage 2 (mild): Secondary | ICD-10-CM | POA: Diagnosis not present

## 2022-07-14 DIAGNOSIS — E1122 Type 2 diabetes mellitus with diabetic chronic kidney disease: Secondary | ICD-10-CM | POA: Diagnosis not present

## 2022-07-14 DIAGNOSIS — E785 Hyperlipidemia, unspecified: Secondary | ICD-10-CM | POA: Diagnosis not present

## 2022-07-14 DIAGNOSIS — I129 Hypertensive chronic kidney disease with stage 1 through stage 4 chronic kidney disease, or unspecified chronic kidney disease: Secondary | ICD-10-CM | POA: Diagnosis not present

## 2022-07-14 DIAGNOSIS — M1A09X0 Idiopathic chronic gout, multiple sites, without tophus (tophi): Secondary | ICD-10-CM | POA: Diagnosis not present

## 2022-07-14 DIAGNOSIS — M80021D Age-related osteoporosis with current pathological fracture, right humerus, subsequent encounter for fracture with routine healing: Secondary | ICD-10-CM | POA: Diagnosis not present

## 2022-07-17 DIAGNOSIS — E785 Hyperlipidemia, unspecified: Secondary | ICD-10-CM | POA: Diagnosis not present

## 2022-07-17 DIAGNOSIS — K219 Gastro-esophageal reflux disease without esophagitis: Secondary | ICD-10-CM | POA: Diagnosis not present

## 2022-07-17 DIAGNOSIS — M80021D Age-related osteoporosis with current pathological fracture, right humerus, subsequent encounter for fracture with routine healing: Secondary | ICD-10-CM | POA: Diagnosis not present

## 2022-07-17 DIAGNOSIS — E892 Postprocedural hypoparathyroidism: Secondary | ICD-10-CM | POA: Diagnosis not present

## 2022-07-17 DIAGNOSIS — M1990 Unspecified osteoarthritis, unspecified site: Secondary | ICD-10-CM | POA: Diagnosis not present

## 2022-07-17 DIAGNOSIS — I129 Hypertensive chronic kidney disease with stage 1 through stage 4 chronic kidney disease, or unspecified chronic kidney disease: Secondary | ICD-10-CM | POA: Diagnosis not present

## 2022-07-17 DIAGNOSIS — M1A09X0 Idiopathic chronic gout, multiple sites, without tophus (tophi): Secondary | ICD-10-CM | POA: Diagnosis not present

## 2022-07-17 DIAGNOSIS — E1122 Type 2 diabetes mellitus with diabetic chronic kidney disease: Secondary | ICD-10-CM | POA: Diagnosis not present

## 2022-07-17 DIAGNOSIS — N182 Chronic kidney disease, stage 2 (mild): Secondary | ICD-10-CM | POA: Diagnosis not present

## 2022-07-18 DIAGNOSIS — M1990 Unspecified osteoarthritis, unspecified site: Secondary | ICD-10-CM | POA: Diagnosis not present

## 2022-07-18 DIAGNOSIS — E785 Hyperlipidemia, unspecified: Secondary | ICD-10-CM | POA: Diagnosis not present

## 2022-07-18 DIAGNOSIS — E1122 Type 2 diabetes mellitus with diabetic chronic kidney disease: Secondary | ICD-10-CM | POA: Diagnosis not present

## 2022-07-18 DIAGNOSIS — E892 Postprocedural hypoparathyroidism: Secondary | ICD-10-CM | POA: Diagnosis not present

## 2022-07-18 DIAGNOSIS — K219 Gastro-esophageal reflux disease without esophagitis: Secondary | ICD-10-CM | POA: Diagnosis not present

## 2022-07-18 DIAGNOSIS — I129 Hypertensive chronic kidney disease with stage 1 through stage 4 chronic kidney disease, or unspecified chronic kidney disease: Secondary | ICD-10-CM | POA: Diagnosis not present

## 2022-07-18 DIAGNOSIS — M80021D Age-related osteoporosis with current pathological fracture, right humerus, subsequent encounter for fracture with routine healing: Secondary | ICD-10-CM | POA: Diagnosis not present

## 2022-07-18 DIAGNOSIS — N182 Chronic kidney disease, stage 2 (mild): Secondary | ICD-10-CM | POA: Diagnosis not present

## 2022-07-18 DIAGNOSIS — M1A09X0 Idiopathic chronic gout, multiple sites, without tophus (tophi): Secondary | ICD-10-CM | POA: Diagnosis not present

## 2022-07-21 DIAGNOSIS — Z6827 Body mass index (BMI) 27.0-27.9, adult: Secondary | ICD-10-CM | POA: Diagnosis not present

## 2022-07-21 DIAGNOSIS — I10 Essential (primary) hypertension: Secondary | ICD-10-CM | POA: Diagnosis not present

## 2022-07-21 DIAGNOSIS — E119 Type 2 diabetes mellitus without complications: Secondary | ICD-10-CM | POA: Diagnosis not present

## 2022-07-21 DIAGNOSIS — E559 Vitamin D deficiency, unspecified: Secondary | ICD-10-CM | POA: Diagnosis not present

## 2022-07-21 DIAGNOSIS — D3502 Benign neoplasm of left adrenal gland: Secondary | ICD-10-CM | POA: Diagnosis not present

## 2022-07-22 DIAGNOSIS — M25511 Pain in right shoulder: Secondary | ICD-10-CM | POA: Diagnosis not present

## 2022-07-24 ENCOUNTER — Ambulatory Visit: Payer: Medicare PPO | Admitting: Cardiology

## 2022-07-24 ENCOUNTER — Encounter: Payer: Self-pay | Admitting: Cardiology

## 2022-07-24 VITALS — BP 157/76 | HR 65 | Ht 65.0 in | Wt 141.8 lb

## 2022-07-24 DIAGNOSIS — E782 Mixed hyperlipidemia: Secondary | ICD-10-CM

## 2022-07-24 DIAGNOSIS — M80021D Age-related osteoporosis with current pathological fracture, right humerus, subsequent encounter for fracture with routine healing: Secondary | ICD-10-CM | POA: Diagnosis not present

## 2022-07-24 DIAGNOSIS — R001 Bradycardia, unspecified: Secondary | ICD-10-CM

## 2022-07-24 DIAGNOSIS — I499 Cardiac arrhythmia, unspecified: Secondary | ICD-10-CM | POA: Diagnosis not present

## 2022-07-24 DIAGNOSIS — K219 Gastro-esophageal reflux disease without esophagitis: Secondary | ICD-10-CM | POA: Diagnosis not present

## 2022-07-24 DIAGNOSIS — E892 Postprocedural hypoparathyroidism: Secondary | ICD-10-CM | POA: Diagnosis not present

## 2022-07-24 DIAGNOSIS — M1990 Unspecified osteoarthritis, unspecified site: Secondary | ICD-10-CM | POA: Diagnosis not present

## 2022-07-24 DIAGNOSIS — Z87891 Personal history of nicotine dependence: Secondary | ICD-10-CM | POA: Diagnosis not present

## 2022-07-24 DIAGNOSIS — M1A09X0 Idiopathic chronic gout, multiple sites, without tophus (tophi): Secondary | ICD-10-CM | POA: Diagnosis not present

## 2022-07-24 DIAGNOSIS — I10 Essential (primary) hypertension: Secondary | ICD-10-CM | POA: Diagnosis not present

## 2022-07-24 DIAGNOSIS — E785 Hyperlipidemia, unspecified: Secondary | ICD-10-CM | POA: Diagnosis not present

## 2022-07-24 DIAGNOSIS — N182 Chronic kidney disease, stage 2 (mild): Secondary | ICD-10-CM | POA: Diagnosis not present

## 2022-07-24 DIAGNOSIS — I129 Hypertensive chronic kidney disease with stage 1 through stage 4 chronic kidney disease, or unspecified chronic kidney disease: Secondary | ICD-10-CM | POA: Diagnosis not present

## 2022-07-24 DIAGNOSIS — E1122 Type 2 diabetes mellitus with diabetic chronic kidney disease: Secondary | ICD-10-CM | POA: Diagnosis not present

## 2022-07-24 NOTE — Progress Notes (Signed)
ID:  Tina Frank, DOB June 15, 1940, MRN [id number]  PCP:  Sigmund Hazel, MD  Cardiologist:  Tessa Lerner, DO, [facility] (established care May 24, 2022) Former Cardiology Providers: None Nephrologist: Dr. Kathrene Bongo  REASON FOR CONSULT: Bradycardia  REQUESTING PHYSICIAN:  Sigmund Hazel, MD [address] [place],  [place] [address]  Chief Complaint  Patient presents with   Bradycardia   New Patient (Initial Visit)   Abnormal ECG    HPI  Tina Frank is a 82 y.o. Caucasian female who presents to the clinic for evaluation of bradycardia at the request of Sigmund Hazel, MD. Her past medical history and cardiovascular risk factors include: Hyperlipidemia, hypertension, gout, GERD history of renal cyst/adrenal mass, history of primary hyperparathyroidism, former smoker.  Patient recently had a right shoulder surgery and is currently having home health care assistance for rehab purposes.  She was told by one of the visiting nurses that she has a slow heart rate and irregular heartbeat and therefore referred to cardiology for further evaluation and management.  Clinically she denies feeling tired, fatigued, lightheaded, dizzy, no near-syncope or syncopal events.  Patient is accompanied by her son at today's office visit and she provides verbal consent with regards to having him present.  FUNCTIONAL STATUS: Prior to shoulder surgery she was walking half a mile 3-4 times a week.  She is motivated to get back to her baseline.  ALLERGIES: Allergies  Allergen Reactions   Abaloparatide Other (See Comments)    "Burred vision, lightheaded" (patient does not recall this in 2024)   Amlodipine     Other Reaction(s): peripheral edema   Mobic [Meloxicam] Hypertension   Crestor [Rosuvastatin] Other (See Comments)    Muscle aches   Norvasc [Amlodipine Besylate] Swelling and Other (See Comments)    Ankle swelling   Simvastatin Other (See Comments)    Muscle aches  Other Reaction(s): high  LFTs   Sulfa Antibiotics Rash   Zetia [Ezetimibe] Other (See Comments)    Muscle aches    MEDICATION LIST PRIOR TO VISIT: Current Meds  Medication Sig   acetaminophen (TYLENOL) 500 MG tablet Take 500 mg by mouth every 6 (six) hours as needed.   allopurinol (ZYLOPRIM) 300 MG tablet Take 300 mg by mouth daily.   amoxicillin (AMOXIL) 500 MG capsule Take 2,000 mg by mouth See admin instructions. Take 2,000 mg by mouth one hour prior to dental procedures   atorvastatin (LIPITOR) 10 MG tablet Take 10 mg by mouth at bedtime.    calcium carbonate (TUMS - DOSED IN MG ELEMENTAL CALCIUM) 500 MG chewable tablet Chew 1 tablet by mouth 3 (three) times daily as needed for indigestion or heartburn.   carvedilol (COREG) 12.5 MG tablet Take 12.5 mg by mouth See admin instructions. Take 12.5 mg by mouth in the morning and at 5 PM   Cholecalciferol (VITAMIN D3) 50 MCG (2000 UT) TABS Take 2,000 Units by mouth every Monday, Wednesday, and Friday.   cloNIDine (CATAPRES) 0.1 MG tablet Take 0.1 mg by mouth daily as needed (for hypertension- if systolic b/p is over 160).   denosumab (PROLIA) 60 MG/ML SOSY injection Inject 60 mg into the skin every 6 (six) months.   diclofenac Sodium (VOLTAREN) 1 % GEL Apply 2-4 g topically 4 (four) times daily as needed (to painful sites).   furosemide (LASIX) 40 MG tablet Take 40 mg by mouth See admin instructions. Take 40 mg by mouth in the morning and at 5 PM   hydrALAZINE (APRESOLINE) 25 MG  tablet Take 25 mg by mouth See admin instructions. Take 25 mg by mouth in the morning and at 5 PM   pantoprazole (PROTONIX) 40 MG tablet Take 40 mg by mouth See admin instructions. Take 40 mg by mouth in the morning and at 5 PM- BEFORE FOOD   telmisartan (MICARDIS) 80 MG tablet Take 0.5 tablets (40 mg total) by mouth daily. (Patient taking differently: Take 40 mg by mouth See admin instructions. Take 40 mg by mouth in the morning and at 5 PM)     PAST MEDICAL HISTORY: Past Medical History:   Diagnosis Date   CKD (chronic kidney disease), stage II    nephrologist-- dr Kathrene Bongo  [facility])   Diabetes mellitus type 2, diet-controlled (HCC)    followed by pcp---   (09-20-2019  per pt does not check blood sugar at home)   GERD (gastroesophageal reflux disease)    History of primary hyperparathyroidism    s/p  parathyroidectomy right inferior on 12-02-2012   Hyperlipidemia    Hypertension    followd by nephrologist----  (09-20-2019  per pt had stress test done some time ago , unsure when/ where, thinks told ok)   OA (osteoarthritis)    Osteoporosis    Prosthetic joint infection (HCC)    followed by dr j. hatcher (ID)   left total shoulder arthroplasty 12/ 2016  ; 12/ 2020  revision with explant joint replacement and hemiarthroplasty;  completed 6 month antibiotic 05/ 2021   Vertigo     PAST SURGICAL HISTORY: Past Surgical History:  Procedure Laterality Date   COLONOSCOPY     DILATATION & CURETTAGE/HYSTEROSCOPY WITH MYOSURE N/A 09/28/2019   Procedure: DILATATION & CURETTAGE/HYSTEROSCOPY WITH MYOSURE;  Surgeon: Olivia Mackie, MD;  Location: [facility];  Service: Gynecology;  Laterality: N/A;   EYE SURGERY  yrs ago   laser surgery for retinal tear.  (unilateral , pt unsure which side)   ORIF HUMERUS FRACTURE Right 04/27/2022   Procedure: OPEN REDUCTION INTERNAL FIXATION (ORIF) PROXIMAL HUMERUS FRACTURE;  Surgeon: Bjorn Pippin, MD;  Location: [facility];  Service: Orthopedics;  Laterality: Right;   PARATHYROIDECTOMY N/A 12/02/2012   Procedure: PARATHYROIDECTOMY with frozen section ;  Surgeon: Velora Heckler, MD;  Location: [facility];  Service: General;  Laterality: N/A;   REVERSE SHOULDER ARTHROPLASTY Right 04/27/2022   Procedure: REVERSE SHOULDER ARTHROPLASTY;  Surgeon: Bjorn Pippin, MD;  Location: [facility];  Service: Orthopedics;  Laterality: Right;   SHOULDER INJECTION Left 07/27/2013   Procedure: SHOULDER INJECTION;  Surgeon: Loreta Ave, MD;  Location:  [facility] OR;  Service: Orthopedics;  Laterality: Left;   STERIOD INJECTION Right 02/14/2015   Procedure: RIGHT SHOULDER CORTISONE INJECTION;  Surgeon: Loreta Ave, MD;  Location: [facility] OR;  Service: Orthopedics;  Laterality: Right;   TOTAL HIP ARTHROPLASTY Left 07/27/2013   Procedure: TOTAL HIP ARTHROPLASTY ANTERIOR APPROACH;  Surgeon: Loreta Ave, MD;  Location: [facility] OR;  Service: Orthopedics;  Laterality: Left;   TOTAL SHOULDER ARTHROPLASTY Left 02/14/2015   Procedure: LEFT TOTAL SHOULDER ARTHROPLASTY;  Surgeon: Loreta Ave, MD;  Location: [facility] OR;  Service: Orthopedics;  Laterality: Left;   TOTAL SHOULDER REVISION Left 02/09/2019   Procedure: TOTAL SHOULDER REVISION;  Surgeon: Bjorn Pippin, MD;  Location: [facility];  Service: Orthopedics;  Laterality: Left;    FAMILY HISTORY: The patient family history includes Breast cancer in her mother; Cancer in her father and mother; Heart failure in her mother.  SOCIAL HISTORY:  The  patient  reports that she quit smoking about 34 years ago. Her smoking use included cigarettes. She has a 20.00 pack-year smoking history. She has never used smokeless tobacco. She reports that she does not drink alcohol and does not use drugs.  REVIEW OF SYSTEMS: Review of Systems  Cardiovascular:  Positive for irregular heartbeat. Negative for chest pain, claudication, dyspnea on exertion, leg swelling, near-syncope, orthopnea, palpitations, paroxysmal nocturnal dyspnea and syncope.  Respiratory:  Negative for shortness of breath.   Hematologic/Lymphatic: Negative for bleeding problem.  Musculoskeletal:  Negative for muscle cramps and myalgias.  Neurological:  Negative for dizziness and light-headedness.    PHYSICAL EXAM:    07/24/2022   12:42 PM 07/24/2022   12:34 PM 04/30/2022    6:22 AM  Vitals with BMI  Height  5\' 5"    Weight  141 lbs 13 oz   BMI  23.6   Systolic 157 169 161  Diastolic 76 71 55  Pulse 65 66 59    Physical Exam  Constitutional: No distress.   Age appropriate, hemodynamically stable, walks w/ walker  Neck: No JVD present.  Cardiovascular: Normal rate, regular rhythm, S1 normal, S2 normal, intact distal pulses and normal pulses. Exam reveals no gallop, no S3 and no S4.  Murmur heard. Pulses:      Dorsalis pedis pulses are 2+ on the right side and 2+ on the left side.       Posterior tibial pulses are 2+ on the right side and 2+ on the left side.  Pulmonary/Chest: Effort normal and breath sounds normal. No stridor. She has no wheezes. She has no rales.  Abdominal: Soft. Bowel sounds are normal. She exhibits no distension. There is no abdominal tenderness.  Musculoskeletal:        General: No edema.     Cervical back: Neck supple.  Neurological: She is alert and oriented to person, place, and time. She has intact cranial nerves (2-12).  Skin: Skin is warm and moist.   CARDIAC DATABASE: EKG: Jul 24, 2022: Sinus bradycardia, 55 bpm, without underlying injury pattern.  Echocardiogram: 10/06/2013 LVEF 65%, no regional wall motion abnormalities, mild eccentric TR jet, see report for additional details  Stress Testing: No results found for this or any previous visit from the past 1095 days.  Heart Catheterization: None  LABORATORY DATA:    Latest Ref Rng & Units 04/30/2022    3:21 AM 04/29/2022    3:24 AM 04/28/2022    3:09 AM  CBC  WBC 4.0 - 10.5 K/uL 5.7  6.5  8.4   Hemoglobin 12.0 - 15.0 g/dL 8.0  7.5  7.9   Hematocrit 36.0 - 46.0 % 25.1  23.8  24.9   Platelets 150 - 400 K/uL 159  137  138        Latest Ref Rng & Units 04/30/2022    3:21 AM 04/28/2022    3:09 AM 04/27/2022    7:39 PM  CMP  Glucose 70 - 99 mg/dL 096  045    BUN 8 - 23 mg/dL 35  35    Creatinine 4.09 - 1.00 mg/dL 8.11  9.14  7.82   Sodium 135 - 145 mmol/L 137  134    Potassium 3.5 - 5.1 mmol/L 4.0  4.2    Chloride 98 - 111 mmol/L 107  105    CO2 22 - 32 mmol/L 22  21    Calcium 8.9 - 10.3 mg/dL 7.5  7.6      Lipid Panel  No results found for:  "CHOL", "TRIG", "HDL", "CHOLHDL", "VLDL", "LDLCALC", "LDLDIRECT", "LABVLDL"  No components found for: "NTPROBNP" No results for input(s): "PROBNP" in the last 8760 hours. No results for input(s): "TSH" in the last 8760 hours.  BMP Recent Labs    04/25/22 1353 04/27/22 1939 04/28/22 0309 04/30/22 0321  NA 135  --  134* 137  K 4.1  --  4.2 4.0  CL 102  --  105 107  CO2 23  --  21* 22  GLUCOSE 152*  --  118* 101*  BUN 36*  --  35* 35*  CREATININE 1.25* 1.34* 1.35* 1.33*  CALCIUM 9.4  --  7.6* 7.5*  GFRNONAA 43* 39* 39* 40*    HEMOGLOBIN A1C Lab Results  Component Value Date   HGBA1C 5.9 (H) 07/19/2019   MPG 123 07/19/2019   External Labs: Collected: July 08, 2022 provided by referring physician. BUN 25, creatinine 0.96. Sodium 139, potassium 3.8, chloride 101, bicarb 27 AST 12, ALT 5, alkaline phosphatase 82   IMPRESSION:    ICD-10-CM   1. Bradycardia  R00.1 EKG 12-Lead    TSH    [facility] ECHOCARDIOGRAM COMPLETE    2. Irregular heart beat  I49.9 LONG TERM MONITOR (3-14 DAYS)    3. Benign hypertension  I10     4. Mixed hyperlipidemia  E78.2     5. Former smoker  Z87.891        RECOMMENDATIONS: Tina Frank is a 82 y.o. Caucasian female whose past medical history and cardiac risk factors include: Hyperlipidemia, hypertension, gout, GERD history of renal cyst/adrenal mass, history of primary hyperparathyroidism, former smoker.  Bradycardia Irregular heart beat Noted incidentally by home health care nurse and other providers. She does feel a bit tired and fatigue likely secondary to postop anemia. Check TSH. Currently on carvedilol for blood pressure management. Has clonidine to use on as needed basis but has not needed it.  Recommended avoiding clonidine if possible. Patient walked 3 laps in the office to evaluate for heart rate/chronotropic response.  Rest HR 71bpm, after first lap 80bpm, after second lab 93bpm.  Zio patch for 7 days to evaluate for average  heart rate, dysrhythmias, and atrial fibrillation monitoring  Benign hypertension Office blood pressures are not at goal. Home blood pressures are around 120-130 mmHg per patient/son. Currently being managed by nephrology. Does carry history of hyperparathyroidism and adrenal pathology.  Mixed hyperlipidemia Currently on atorvastatin.   She denies myalgia or other side effects. Currently managed by primary care provider.  Data Reviewed: I have independently reviewed external notes provided by the referring provider as part of this office visit.   I have independently reviewed results of EKG, labs as part of medical decision making. I have ordered the following tests:  Orders Placed This Encounter  Procedures   TSH   LONG TERM MONITOR (3-14 DAYS)    Standing Status:   Future    Standing Expiration Date:   07/24/2023    Order Specific Question:   Where should this test be performed?    Answer:   [facility]    Order Specific Question:   Does the patient have an implanted cardiac device?    Answer:   No    Order Specific Question:   Prescribed days of wear    Answer:   7    Order Specific Question:   Type of enrollment    Answer:   Clinic Enrollment   EKG 12-Lead   [facility] ECHOCARDIOGRAM COMPLETE  Standing Status:   Future    Standing Expiration Date:   07/24/2023   I have not made medications changes at today's encounter as noted above. History of present illness was obtained by both patient and son at today's office visit.   FINAL MEDICATION LIST END OF ENCOUNTER: No orders of the defined types were placed in this encounter.   Medications Discontinued During This Encounter  Medication Reason   famotidine (PEPCID) 20 MG tablet Completed Course   hydrALAZINE (APRESOLINE) 50 MG tablet Completed Course   ketoconazole (NIZORAL) 2 % cream Completed Course   sucralfate (CARAFATE) 1 g tablet Completed Course   prednisoLONE acetate (PRED FORTE) 1 % ophthalmic suspension  Completed Course   moxifloxacin (VIGAMOX) 0.5 % ophthalmic solution Completed Course   oxyCODONE (OXY IR/ROXICODONE) 5 MG immediate release tablet Completed Course     Current Outpatient Medications:    acetaminophen (TYLENOL) 500 MG tablet, Take 500 mg by mouth every 6 (six) hours as needed., Disp: , Rfl:    allopurinol (ZYLOPRIM) 300 MG tablet, Take 300 mg by mouth daily., Disp: , Rfl:    amoxicillin (AMOXIL) 500 MG capsule, Take 2,000 mg by mouth See admin instructions. Take 2,000 mg by mouth one hour prior to dental procedures, Disp: , Rfl:    atorvastatin (LIPITOR) 10 MG tablet, Take 10 mg by mouth at bedtime. , Disp: , Rfl:    calcium carbonate (TUMS - DOSED IN MG ELEMENTAL CALCIUM) 500 MG chewable tablet, Chew 1 tablet by mouth 3 (three) times daily as needed for indigestion or heartburn., Disp: , Rfl:    carvedilol (COREG) 12.5 MG tablet, Take 12.5 mg by mouth See admin instructions. Take 12.5 mg by mouth in the morning and at 5 PM, Disp: , Rfl: 0   Cholecalciferol (VITAMIN D3) 50 MCG (2000 UT) TABS, Take 2,000 Units by mouth every Monday, Wednesday, and Friday., Disp: , Rfl:    cloNIDine (CATAPRES) 0.1 MG tablet, Take 0.1 mg by mouth daily as needed (for hypertension- if systolic b/p is over 160)., Disp: , Rfl:    denosumab (PROLIA) 60 MG/ML SOSY injection, Inject 60 mg into the skin every 6 (six) months., Disp: 1 mL, Rfl: 0   diclofenac Sodium (VOLTAREN) 1 % GEL, Apply 2-4 g topically 4 (four) times daily as needed (to painful sites)., Disp: , Rfl:    furosemide (LASIX) 40 MG tablet, Take 40 mg by mouth See admin instructions. Take 40 mg by mouth in the morning and at 5 PM, Disp: , Rfl: 2   hydrALAZINE (APRESOLINE) 25 MG tablet, Take 25 mg by mouth See admin instructions. Take 25 mg by mouth in the morning and at 5 PM, Disp: , Rfl:    pantoprazole (PROTONIX) 40 MG tablet, Take 40 mg by mouth See admin instructions. Take 40 mg by mouth in the morning and at 5 PM- BEFORE FOOD, Disp: , Rfl:     telmisartan (MICARDIS) 80 MG tablet, Take 0.5 tablets (40 mg total) by mouth daily. (Patient taking differently: Take 40 mg by mouth See admin instructions. Take 40 mg by mouth in the morning and at 5 PM), Disp: 30 tablet, Rfl: 1  Orders Placed This Encounter  Procedures   TSH   LONG TERM MONITOR (3-14 DAYS)   EKG 12-Lead   [facility] ECHOCARDIOGRAM COMPLETE    There are no Patient Instructions on file for this visit.   --Continue cardiac medications as reconciled in final medication list. --Return in about 8 weeks (around 09/18/2022)  for Follow up Bradycardia. Marland Kitchen or sooner if needed. --Continue follow-up with your primary care physician regarding the management of your other chronic comorbid conditions.  Patient's questions and concerns were addressed to her satisfaction. She voices understanding of the instructions provided during this encounter.   This note was created using a voice recognition software as a result there may be grammatical errors inadvertently enclosed that do not reflect the nature of this encounter. Every attempt is made to correct such errors.  Tessa Lerner, [place], [facility]  Pager:  [phone] Office: [phone]

## 2022-07-29 DIAGNOSIS — M19011 Primary osteoarthritis, right shoulder: Secondary | ICD-10-CM | POA: Diagnosis not present

## 2022-07-29 DIAGNOSIS — M25611 Stiffness of right shoulder, not elsewhere classified: Secondary | ICD-10-CM | POA: Diagnosis not present

## 2022-07-29 DIAGNOSIS — M6281 Muscle weakness (generalized): Secondary | ICD-10-CM | POA: Diagnosis not present

## 2022-08-06 DIAGNOSIS — M19011 Primary osteoarthritis, right shoulder: Secondary | ICD-10-CM | POA: Diagnosis not present

## 2022-08-06 DIAGNOSIS — M6281 Muscle weakness (generalized): Secondary | ICD-10-CM | POA: Diagnosis not present

## 2022-08-06 DIAGNOSIS — M25611 Stiffness of right shoulder, not elsewhere classified: Secondary | ICD-10-CM | POA: Diagnosis not present

## 2022-08-07 DIAGNOSIS — M19011 Primary osteoarthritis, right shoulder: Secondary | ICD-10-CM | POA: Diagnosis not present

## 2022-08-07 DIAGNOSIS — M25611 Stiffness of right shoulder, not elsewhere classified: Secondary | ICD-10-CM | POA: Diagnosis not present

## 2022-08-07 DIAGNOSIS — M6281 Muscle weakness (generalized): Secondary | ICD-10-CM | POA: Diagnosis not present

## 2022-08-11 DIAGNOSIS — M19011 Primary osteoarthritis, right shoulder: Secondary | ICD-10-CM | POA: Diagnosis not present

## 2022-08-11 DIAGNOSIS — M25611 Stiffness of right shoulder, not elsewhere classified: Secondary | ICD-10-CM | POA: Diagnosis not present

## 2022-08-11 DIAGNOSIS — M6281 Muscle weakness (generalized): Secondary | ICD-10-CM | POA: Diagnosis not present

## 2022-08-15 DIAGNOSIS — M25611 Stiffness of right shoulder, not elsewhere classified: Secondary | ICD-10-CM | POA: Diagnosis not present

## 2022-08-15 DIAGNOSIS — M19011 Primary osteoarthritis, right shoulder: Secondary | ICD-10-CM | POA: Diagnosis not present

## 2022-08-15 DIAGNOSIS — M6281 Muscle weakness (generalized): Secondary | ICD-10-CM | POA: Diagnosis not present

## 2022-08-18 DIAGNOSIS — M25611 Stiffness of right shoulder, not elsewhere classified: Secondary | ICD-10-CM | POA: Diagnosis not present

## 2022-08-18 DIAGNOSIS — M6281 Muscle weakness (generalized): Secondary | ICD-10-CM | POA: Diagnosis not present

## 2022-08-18 DIAGNOSIS — M19011 Primary osteoarthritis, right shoulder: Secondary | ICD-10-CM | POA: Diagnosis not present

## 2022-08-22 ENCOUNTER — Other Ambulatory Visit: Payer: Medicare PPO

## 2022-08-22 DIAGNOSIS — M25611 Stiffness of right shoulder, not elsewhere classified: Secondary | ICD-10-CM | POA: Diagnosis not present

## 2022-08-22 DIAGNOSIS — M19011 Primary osteoarthritis, right shoulder: Secondary | ICD-10-CM | POA: Diagnosis not present

## 2022-08-22 DIAGNOSIS — I499 Cardiac arrhythmia, unspecified: Secondary | ICD-10-CM

## 2022-08-22 DIAGNOSIS — M6281 Muscle weakness (generalized): Secondary | ICD-10-CM | POA: Diagnosis not present

## 2022-08-29 DIAGNOSIS — H6123 Impacted cerumen, bilateral: Secondary | ICD-10-CM | POA: Diagnosis not present

## 2022-08-29 DIAGNOSIS — I499 Cardiac arrhythmia, unspecified: Secondary | ICD-10-CM | POA: Diagnosis not present

## 2022-08-29 DIAGNOSIS — M810 Age-related osteoporosis without current pathological fracture: Secondary | ICD-10-CM | POA: Diagnosis not present

## 2022-08-29 DIAGNOSIS — I700 Atherosclerosis of aorta: Secondary | ICD-10-CM | POA: Diagnosis not present

## 2022-08-29 DIAGNOSIS — Z6823 Body mass index (BMI) 23.0-23.9, adult: Secondary | ICD-10-CM | POA: Diagnosis not present

## 2022-08-29 DIAGNOSIS — R350 Frequency of micturition: Secondary | ICD-10-CM | POA: Diagnosis not present

## 2022-09-02 DIAGNOSIS — M6281 Muscle weakness (generalized): Secondary | ICD-10-CM | POA: Diagnosis not present

## 2022-09-02 DIAGNOSIS — M25611 Stiffness of right shoulder, not elsewhere classified: Secondary | ICD-10-CM | POA: Diagnosis not present

## 2022-09-02 DIAGNOSIS — M19011 Primary osteoarthritis, right shoulder: Secondary | ICD-10-CM | POA: Diagnosis not present

## 2022-09-03 ENCOUNTER — Other Ambulatory Visit: Payer: Self-pay | Admitting: Family Medicine

## 2022-09-03 DIAGNOSIS — M810 Age-related osteoporosis without current pathological fracture: Secondary | ICD-10-CM

## 2022-09-05 DIAGNOSIS — M19011 Primary osteoarthritis, right shoulder: Secondary | ICD-10-CM | POA: Diagnosis not present

## 2022-09-05 DIAGNOSIS — M25611 Stiffness of right shoulder, not elsewhere classified: Secondary | ICD-10-CM | POA: Diagnosis not present

## 2022-09-05 DIAGNOSIS — M6281 Muscle weakness (generalized): Secondary | ICD-10-CM | POA: Diagnosis not present

## 2022-09-08 DIAGNOSIS — M25611 Stiffness of right shoulder, not elsewhere classified: Secondary | ICD-10-CM | POA: Diagnosis not present

## 2022-09-08 DIAGNOSIS — M6281 Muscle weakness (generalized): Secondary | ICD-10-CM | POA: Diagnosis not present

## 2022-09-08 DIAGNOSIS — M19011 Primary osteoarthritis, right shoulder: Secondary | ICD-10-CM | POA: Diagnosis not present

## 2022-09-10 DIAGNOSIS — M25611 Stiffness of right shoulder, not elsewhere classified: Secondary | ICD-10-CM | POA: Diagnosis not present

## 2022-09-10 DIAGNOSIS — M6281 Muscle weakness (generalized): Secondary | ICD-10-CM | POA: Diagnosis not present

## 2022-09-10 DIAGNOSIS — M19011 Primary osteoarthritis, right shoulder: Secondary | ICD-10-CM | POA: Diagnosis not present

## 2022-09-15 DIAGNOSIS — M19011 Primary osteoarthritis, right shoulder: Secondary | ICD-10-CM | POA: Diagnosis not present

## 2022-09-15 DIAGNOSIS — M6281 Muscle weakness (generalized): Secondary | ICD-10-CM | POA: Diagnosis not present

## 2022-09-15 DIAGNOSIS — M25611 Stiffness of right shoulder, not elsewhere classified: Secondary | ICD-10-CM | POA: Diagnosis not present

## 2022-09-18 ENCOUNTER — Ambulatory Visit: Payer: Medicare PPO | Admitting: Cardiology

## 2022-09-19 DIAGNOSIS — M19011 Primary osteoarthritis, right shoulder: Secondary | ICD-10-CM | POA: Diagnosis not present

## 2022-09-19 DIAGNOSIS — M6281 Muscle weakness (generalized): Secondary | ICD-10-CM | POA: Diagnosis not present

## 2022-09-19 DIAGNOSIS — M25611 Stiffness of right shoulder, not elsewhere classified: Secondary | ICD-10-CM | POA: Diagnosis not present

## 2022-09-23 DIAGNOSIS — M6281 Muscle weakness (generalized): Secondary | ICD-10-CM | POA: Diagnosis not present

## 2022-09-23 DIAGNOSIS — M19011 Primary osteoarthritis, right shoulder: Secondary | ICD-10-CM | POA: Diagnosis not present

## 2022-09-23 DIAGNOSIS — M25611 Stiffness of right shoulder, not elsewhere classified: Secondary | ICD-10-CM | POA: Diagnosis not present

## 2022-09-23 DIAGNOSIS — M79641 Pain in right hand: Secondary | ICD-10-CM | POA: Diagnosis not present

## 2022-09-29 DIAGNOSIS — M19011 Primary osteoarthritis, right shoulder: Secondary | ICD-10-CM | POA: Diagnosis not present

## 2022-09-29 DIAGNOSIS — M25611 Stiffness of right shoulder, not elsewhere classified: Secondary | ICD-10-CM | POA: Diagnosis not present

## 2022-09-29 DIAGNOSIS — M6281 Muscle weakness (generalized): Secondary | ICD-10-CM | POA: Diagnosis not present

## 2022-10-01 ENCOUNTER — Other Ambulatory Visit: Payer: Self-pay

## 2022-10-01 ENCOUNTER — Encounter: Payer: Self-pay | Admitting: Neurology

## 2022-10-01 DIAGNOSIS — R202 Paresthesia of skin: Secondary | ICD-10-CM

## 2022-10-02 DIAGNOSIS — M6281 Muscle weakness (generalized): Secondary | ICD-10-CM | POA: Diagnosis not present

## 2022-10-02 DIAGNOSIS — M25611 Stiffness of right shoulder, not elsewhere classified: Secondary | ICD-10-CM | POA: Diagnosis not present

## 2022-10-02 DIAGNOSIS — M19011 Primary osteoarthritis, right shoulder: Secondary | ICD-10-CM | POA: Diagnosis not present

## 2022-10-08 ENCOUNTER — Ambulatory Visit: Payer: Medicare PPO | Admitting: Podiatry

## 2022-10-08 ENCOUNTER — Encounter: Payer: Self-pay | Admitting: Podiatry

## 2022-10-08 DIAGNOSIS — E119 Type 2 diabetes mellitus without complications: Secondary | ICD-10-CM

## 2022-10-08 DIAGNOSIS — B351 Tinea unguium: Secondary | ICD-10-CM

## 2022-10-08 DIAGNOSIS — M79676 Pain in unspecified toe(s): Secondary | ICD-10-CM | POA: Diagnosis not present

## 2022-10-08 NOTE — Progress Notes (Signed)
This patient returns to my office for at risk foot care.  This patient requires this care by a professional since this patient will be at risk due to having diabetes type 2.  This patient is unable to cut nails herself since the patient cannot reach her nails.These nails are painful walking and wearing shoes.  This patient presents for at risk foot care today.  General Appearance  Alert, conversant and in no acute stress.  Vascular  Dorsalis pedis and posterior tibial  pulses are  weakly palpable  bilaterally.  Capillary return is within normal limits  bilaterally. Temperature is within normal limits  bilaterally.  Neurologic  Senn-Weinstein monofilament wire test within normal limits  bilaterally. Muscle power within normal limits bilaterally.  Nails Thick disfigured discolored nails with subungual debris  from hallux to fifth toes bilaterally. No evidence of bacterial infection or drainage bilaterally.  Orthopedic  No limitations of motion  feet .  No crepitus or effusions noted.  No bony pathology or digital deformities noted.  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  Skin  normotropic skin with no porokeratosis noted bilaterally.  No signs of infections or ulcers noted.     Onychomycosis  Pain in right toes  Pain in left toes  Chronic arthrits  Midfoot  B/L.  Consent was obtained for treatment procedures.   Mechanical debridement of nails 1-5  bilaterally performed with a nail nipper.  Filed with dremel without incident.   Return office visit     3 months               Told patient to return for periodic foot care and evaluation due to potential at risk complications.   Helane Gunther DPM

## 2022-10-12 ENCOUNTER — Other Ambulatory Visit: Payer: Self-pay

## 2022-10-12 ENCOUNTER — Emergency Department (HOSPITAL_COMMUNITY): Payer: Medicare PPO

## 2022-10-12 ENCOUNTER — Inpatient Hospital Stay (HOSPITAL_COMMUNITY)
Admission: EM | Admit: 2022-10-12 | Discharge: 2022-10-17 | DRG: 064 | Disposition: A | Discharge status: Skilled Nursing Facility | Payer: Medicare PPO | Attending: Internal Medicine | Admitting: Internal Medicine

## 2022-10-12 ENCOUNTER — Encounter (HOSPITAL_COMMUNITY): Payer: Self-pay

## 2022-10-12 DIAGNOSIS — I6389 Other cerebral infarction: Secondary | ICD-10-CM | POA: Diagnosis not present

## 2022-10-12 DIAGNOSIS — I130 Hypertensive heart and chronic kidney disease with heart failure and stage 1 through stage 4 chronic kidney disease, or unspecified chronic kidney disease: Secondary | ICD-10-CM | POA: Diagnosis present

## 2022-10-12 DIAGNOSIS — Z96612 Presence of left artificial shoulder joint: Secondary | ICD-10-CM | POA: Diagnosis not present

## 2022-10-12 DIAGNOSIS — R410 Disorientation, unspecified: Secondary | ICD-10-CM | POA: Diagnosis not present

## 2022-10-12 DIAGNOSIS — Z886 Allergy status to analgesic agent status: Secondary | ICD-10-CM

## 2022-10-12 DIAGNOSIS — I6523 Occlusion and stenosis of bilateral carotid arteries: Secondary | ICD-10-CM | POA: Diagnosis not present

## 2022-10-12 DIAGNOSIS — Z66 Do not resuscitate: Secondary | ICD-10-CM | POA: Diagnosis present

## 2022-10-12 DIAGNOSIS — Z8673 Personal history of transient ischemic attack (TIA), and cerebral infarction without residual deficits: Secondary | ICD-10-CM | POA: Diagnosis present

## 2022-10-12 DIAGNOSIS — N390 Urinary tract infection, site not specified: Secondary | ICD-10-CM | POA: Diagnosis present

## 2022-10-12 DIAGNOSIS — R7681 Abnormal rheumatoid factor and anti-citrullinated protein antibody without rheumatoid arthritis: Secondary | ICD-10-CM

## 2022-10-12 DIAGNOSIS — G9341 Metabolic encephalopathy: Secondary | ICD-10-CM | POA: Diagnosis present

## 2022-10-12 DIAGNOSIS — I639 Cerebral infarction, unspecified: Secondary | ICD-10-CM

## 2022-10-12 DIAGNOSIS — E119 Type 2 diabetes mellitus without complications: Secondary | ICD-10-CM | POA: Diagnosis not present

## 2022-10-12 DIAGNOSIS — E876 Hypokalemia: Secondary | ICD-10-CM | POA: Diagnosis present

## 2022-10-12 DIAGNOSIS — R768 Other specified abnormal immunological findings in serum: Secondary | ICD-10-CM

## 2022-10-12 DIAGNOSIS — R29818 Other symptoms and signs involving the nervous system: Secondary | ICD-10-CM | POA: Diagnosis not present

## 2022-10-12 DIAGNOSIS — Z803 Family history of malignant neoplasm of breast: Secondary | ICD-10-CM

## 2022-10-12 DIAGNOSIS — Z1152 Encounter for screening for COVID-19: Secondary | ICD-10-CM

## 2022-10-12 DIAGNOSIS — R059 Cough, unspecified: Secondary | ICD-10-CM | POA: Diagnosis not present

## 2022-10-12 DIAGNOSIS — Z882 Allergy status to sulfonamides status: Secondary | ICD-10-CM | POA: Diagnosis not present

## 2022-10-12 DIAGNOSIS — E1122 Type 2 diabetes mellitus with diabetic chronic kidney disease: Secondary | ICD-10-CM | POA: Diagnosis present

## 2022-10-12 DIAGNOSIS — N182 Chronic kidney disease, stage 2 (mild): Secondary | ICD-10-CM | POA: Diagnosis present

## 2022-10-12 DIAGNOSIS — Z91148 Patient's other noncompliance with medication regimen for other reason: Secondary | ICD-10-CM

## 2022-10-12 DIAGNOSIS — E785 Hyperlipidemia, unspecified: Secondary | ICD-10-CM | POA: Diagnosis present

## 2022-10-12 DIAGNOSIS — E860 Dehydration: Secondary | ICD-10-CM | POA: Diagnosis present

## 2022-10-12 DIAGNOSIS — Z87891 Personal history of nicotine dependence: Secondary | ICD-10-CM | POA: Diagnosis not present

## 2022-10-12 DIAGNOSIS — I509 Heart failure, unspecified: Secondary | ICD-10-CM | POA: Diagnosis present

## 2022-10-12 DIAGNOSIS — J9601 Acute respiratory failure with hypoxia: Secondary | ICD-10-CM | POA: Diagnosis not present

## 2022-10-12 DIAGNOSIS — R001 Bradycardia, unspecified: Secondary | ICD-10-CM | POA: Diagnosis not present

## 2022-10-12 DIAGNOSIS — I69391 Dysphagia following cerebral infarction: Secondary | ICD-10-CM | POA: Diagnosis not present

## 2022-10-12 DIAGNOSIS — R5383 Other fatigue: Secondary | ICD-10-CM | POA: Diagnosis not present

## 2022-10-12 DIAGNOSIS — M810 Age-related osteoporosis without current pathological fracture: Secondary | ICD-10-CM | POA: Diagnosis present

## 2022-10-12 DIAGNOSIS — R4182 Altered mental status, unspecified: Secondary | ICD-10-CM | POA: Diagnosis present

## 2022-10-12 DIAGNOSIS — H9193 Unspecified hearing loss, bilateral: Secondary | ICD-10-CM | POA: Diagnosis present

## 2022-10-12 DIAGNOSIS — R0989 Other specified symptoms and signs involving the circulatory and respiratory systems: Secondary | ICD-10-CM | POA: Diagnosis not present

## 2022-10-12 DIAGNOSIS — R29706 NIHSS score 6: Secondary | ICD-10-CM | POA: Diagnosis present

## 2022-10-12 DIAGNOSIS — Z888 Allergy status to other drugs, medicaments and biological substances status: Secondary | ICD-10-CM | POA: Diagnosis not present

## 2022-10-12 DIAGNOSIS — I771 Stricture of artery: Secondary | ICD-10-CM | POA: Diagnosis not present

## 2022-10-12 DIAGNOSIS — Z7401 Bed confinement status: Secondary | ICD-10-CM | POA: Diagnosis not present

## 2022-10-12 DIAGNOSIS — I700 Atherosclerosis of aorta: Secondary | ICD-10-CM | POA: Diagnosis not present

## 2022-10-12 DIAGNOSIS — Z8249 Family history of ischemic heart disease and other diseases of the circulatory system: Secondary | ICD-10-CM

## 2022-10-12 DIAGNOSIS — R918 Other nonspecific abnormal finding of lung field: Secondary | ICD-10-CM | POA: Diagnosis not present

## 2022-10-12 DIAGNOSIS — I10 Essential (primary) hypertension: Secondary | ICD-10-CM | POA: Diagnosis present

## 2022-10-12 DIAGNOSIS — R9082 White matter disease, unspecified: Secondary | ICD-10-CM | POA: Diagnosis not present

## 2022-10-12 DIAGNOSIS — R4781 Slurred speech: Secondary | ICD-10-CM | POA: Diagnosis not present

## 2022-10-12 DIAGNOSIS — M109 Gout, unspecified: Secondary | ICD-10-CM | POA: Diagnosis not present

## 2022-10-12 DIAGNOSIS — Z79899 Other long term (current) drug therapy: Secondary | ICD-10-CM | POA: Diagnosis not present

## 2022-10-12 DIAGNOSIS — R4701 Aphasia: Secondary | ICD-10-CM | POA: Diagnosis present

## 2022-10-12 DIAGNOSIS — Z96611 Presence of right artificial shoulder joint: Secondary | ICD-10-CM | POA: Diagnosis present

## 2022-10-12 DIAGNOSIS — I63231 Cerebral infarction due to unspecified occlusion or stenosis of right carotid arteries: Secondary | ICD-10-CM | POA: Diagnosis not present

## 2022-10-12 DIAGNOSIS — Z96642 Presence of left artificial hip joint: Secondary | ICD-10-CM | POA: Diagnosis present

## 2022-10-12 DIAGNOSIS — I672 Cerebral atherosclerosis: Secondary | ICD-10-CM | POA: Diagnosis not present

## 2022-10-12 DIAGNOSIS — R0600 Dyspnea, unspecified: Secondary | ICD-10-CM | POA: Diagnosis not present

## 2022-10-12 DIAGNOSIS — K2100 Gastro-esophageal reflux disease with esophagitis, without bleeding: Secondary | ICD-10-CM | POA: Diagnosis present

## 2022-10-12 DIAGNOSIS — R2681 Unsteadiness on feet: Secondary | ICD-10-CM | POA: Diagnosis not present

## 2022-10-12 DIAGNOSIS — B961 Klebsiella pneumoniae [K. pneumoniae] as the cause of diseases classified elsewhere: Secondary | ICD-10-CM | POA: Diagnosis present

## 2022-10-12 DIAGNOSIS — R531 Weakness: Secondary | ICD-10-CM | POA: Diagnosis not present

## 2022-10-12 LAB — CBC WITH DIFFERENTIAL/PLATELET
Abs Immature Granulocytes: 0.05 10*3/uL (ref 0.00–0.07)
Basophils Absolute: 0.1 10*3/uL (ref 0.0–0.1)
Basophils Relative: 1 %
Eosinophils Absolute: 0.2 10*3/uL (ref 0.0–0.5)
Eosinophils Relative: 3 %
HCT: 36.2 % (ref 36.0–46.0)
Hemoglobin: 11.7 g/dL — ABNORMAL LOW (ref 12.0–15.0)
Immature Granulocytes: 1 %
Lymphocytes Relative: 15 %
Lymphs Abs: 1.3 10*3/uL (ref 0.7–4.0)
MCH: 29 pg (ref 26.0–34.0)
MCHC: 32.3 g/dL (ref 30.0–36.0)
MCV: 89.6 fL (ref 80.0–100.0)
Monocytes Absolute: 0.9 10*3/uL (ref 0.1–1.0)
Monocytes Relative: 10 %
Neutro Abs: 6.2 10*3/uL (ref 1.7–7.7)
Neutrophils Relative %: 70 %
Platelets: 167 10*3/uL (ref 150–400)
RBC: 4.04 MIL/uL (ref 3.87–5.11)
RDW: 18.1 % — ABNORMAL HIGH (ref 11.5–15.5)
WBC: 8.7 10*3/uL (ref 4.0–10.5)
nRBC: 0 % (ref 0.0–0.2)

## 2022-10-12 LAB — COMPREHENSIVE METABOLIC PANEL
ALT: 9 U/L (ref 0–44)
AST: 16 U/L (ref 15–41)
Albumin: 2.9 g/dL — ABNORMAL LOW (ref 3.5–5.0)
Alkaline Phosphatase: 65 U/L (ref 38–126)
Anion gap: 12 (ref 5–15)
BUN: 13 mg/dL (ref 8–23)
CO2: 22 mmol/L (ref 22–32)
Calcium: 11.3 mg/dL — ABNORMAL HIGH (ref 8.9–10.3)
Chloride: 104 mmol/L (ref 98–111)
Creatinine, Ser: 0.76 mg/dL (ref 0.44–1.00)
GFR, Estimated: >60 mL/min (ref >60)
Glucose, Bld: 112 mg/dL — ABNORMAL HIGH (ref 70–99)
Potassium: 2.3 mmol/L — CL (ref 3.5–5.1)
Sodium: 138 mmol/L (ref 135–145)
Total Bilirubin: 1.1 mg/dL (ref 0.3–1.2)
Total Protein: 5.7 g/dL — ABNORMAL LOW (ref 6.5–8.1)

## 2022-10-12 LAB — LIPASE, BLOOD: Lipase: 26 U/L (ref 11–51)

## 2022-10-12 LAB — PROTIME-INR
INR: 1 (ref 0.8–1.2)
Prothrombin Time: 13.5 seconds (ref 11.4–15.2)

## 2022-10-12 LAB — AMMONIA: Ammonia: <10 umol/L (ref 9–35)

## 2022-10-12 LAB — RESP PANEL BY RT-PCR (RSV, FLU A&B, COVID)  RVPGX2
Influenza A by PCR: NEGATIVE
Influenza B by PCR: NEGATIVE
Resp Syncytial Virus by PCR: NEGATIVE
SARS Coronavirus 2 by RT PCR: NEGATIVE

## 2022-10-12 LAB — MAGNESIUM: Magnesium: 1.2 mg/dL — ABNORMAL LOW (ref 1.7–2.4)

## 2022-10-12 LAB — I-STAT CG4 LACTIC ACID, ED: Lactic Acid, Venous: 0.8 mmol/L (ref 0.5–1.9)

## 2022-10-12 LAB — TSH: TSH: 1.293 u[IU]/mL (ref 0.350–4.500)

## 2022-10-12 MED ORDER — MAGNESIUM SULFATE 2 GM/50ML IV SOLN
2.0000 g | Freq: Once | INTRAVENOUS | Status: AC
  Administered 2022-10-13: 2 g via INTRAVENOUS
  Filled 2022-10-12: qty 50

## 2022-10-12 MED ORDER — POTASSIUM CHLORIDE CRYS ER 20 MEQ PO TBCR
40.0000 meq | EXTENDED_RELEASE_TABLET | ORAL | Status: AC
  Administered 2022-10-12 – 2022-10-13 (×2): 40 meq via ORAL
  Filled 2022-10-12 (×2): qty 2

## 2022-10-12 MED ORDER — POTASSIUM CHLORIDE 10 MEQ/100ML IV SOLN
10.0000 meq | INTRAVENOUS | Status: AC
  Administered 2022-10-12 – 2022-10-13 (×4): 10 meq via INTRAVENOUS
  Filled 2022-10-12 (×4): qty 100

## 2022-10-12 MED ORDER — MAGNESIUM SULFATE 2 GM/50ML IV SOLN
2.0000 g | Freq: Once | INTRAVENOUS | Status: AC
  Administered 2022-10-12: 2 g via INTRAVENOUS
  Filled 2022-10-12 (×2): qty 50

## 2022-10-12 MED ORDER — SODIUM CHLORIDE 0.9 % IV BOLUS
500.0000 mL | Freq: Once | INTRAVENOUS | Status: AC
  Administered 2022-10-12: 500 mL via INTRAVENOUS

## 2022-10-12 MED ORDER — SODIUM CHLORIDE 0.9 % IV SOLN: INTRAVENOUS | Status: DC

## 2022-10-12 NOTE — ED Triage Notes (Signed)
Per EMS was call out for Slurred speech with right sided weakness onset 10/10/22. Pt alert and oriented. C/O pain to right shoulder and right knee.

## 2022-10-12 NOTE — Assessment & Plan Note (Addendum)
Stable. Hold coreg.

## 2022-10-12 NOTE — Assessment & Plan Note (Signed)
Replete aggressively with IV kcl and IV mag

## 2022-10-12 NOTE — Assessment & Plan Note (Signed)
Unclear the cause of her hypercalcemic. Pt thinks she may be urinating more frequently but pt wears adult diapers.  Check vitamin D level and calcitriol levels. Pt states she had not been compliant with taking her vitamin D supplements(only 2000 units). Takes only 1 tums per day.  Pt with hx of positive anti-ccp per rheumatology notes. Unclear the cause. Pt is not taking any RA treatments. Will start with IV hydration and follow serum and ionized calcium. If remains elevated, consider IV Zometa +/- IM/SQ calcitonin.  May need neoplastic workup. Son(steve) states pt has had routine cancer screenings include mammograms and colonoscopy that have been negative. No personal hx of cancers.

## 2022-10-12 NOTE — Subjective & Objective (Addendum)
CC: word finding difficulty, weakness HPI:  82 year old female prior history of hypertension, diabetes, hyperlipidemia, history of gout presents to the ER today with a 2-day history of worsening weakness, 1 day history of worsening word finding.  Patient lives home alone.  Family is planning on moving her out of her home and into assisted living.  Son Brett Canales is at the bedside.  He states the patient was having difficulty with worsening weakness 2 days ago.  Patient had her daughter-in-law stay with her on Saturday night.  On Sunday, the son noticed the patient is having difficulty with word finding.  Patient is brought into the ER.  Patient states that she has noticed some increase in urinary frequency but cannot quantify it as she wears adult diapers.  Patient's had normal screening tests for cancer.  She has no personal history of cancer.  She has only been sporadically taking her vitamin D supplements.  She takes 1 or 2 tablets of Tums a day for reflux.  She does not use excessive amounts of calcium.  She does not eat a lot of cheese, milk.  Arrival to the ER temp 90.2 heart rate 80 blood pressure 154/140.  White count 8.7, hemoglobin 1.7, platelets of 167  Magnesium 1.2 Sodium 138, potassium 2.1, chloride 104, bicarb 22, BUN of 13, creatinine 0.76, glucose 112  Calcium 11.3, albumin 2.9, total protein 5.7, corrected calcium of 12.8  TSH normal at 1.29  CT head shows no acute intracranial abnormality.  MRI of the brain demonstrated diffusion signal abnormality involving the posterior limb of the right internal capsule.  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  Patient did have a fall back in 2023 with a subarachnoid hemorrhage in the right occipital lobe.  Chest x-ray is cardiomegaly.  Small left pleural effusion with atelectasis.  EDP has consulted neurology.  [facility] hospitalist contacted for admission.

## 2022-10-12 NOTE — Assessment & Plan Note (Signed)
Hold HTN meds. Allow for permissive hypertension. Keep BP <220/110.

## 2022-10-12 NOTE — ED Notes (Signed)
ED TO INPATIENT HANDOFF REPORT  ED Nurse Name and Phone #: [phone]  S Name/Age/Gender Tina Frank 82 y.o. female Room/Bed: 037C/037C  Code Status   Code Status: DNR  Home/SNF/Other Home Patient oriented to: self, place, time, and situation Is this baseline? Yes   Triage Complete: Triage complete  Chief Complaint Acute CVA (cerebrovascular accident) [facility]) [I63.9]  Triage Note Per EMS was call out for Slurred speech with right sided weakness onset 10/10/22. Pt alert and oriented. C/O pain to right shoulder and right knee.   Allergies Allergies  Allergen Reactions   Abaloparatide Other (See Comments)    "Burred vision, lightheaded" (patient does not recall this in 2024)   Mobic [Meloxicam] Hypertension   Crestor [Rosuvastatin] Other (See Comments)    Muscle aches   Norvasc [Amlodipine Besylate] Swelling and Other (See Comments)    Ankle swelling   Simvastatin Other (See Comments)    Muscle aches  Other Reaction(s): high LFTs   Sulfa Antibiotics Rash   Zetia [Ezetimibe] Other (See Comments)    Muscle aches    Level of Care/Admitting Diagnosis ED Disposition     ED Disposition  Admit   Condition  --   Comment  Hospital Area: [facility] [[id number]]  Level of Care: Telemetry Cardiac [[id number]]  May place patient in observation at [facility] or [facility] if equivalent level of care is available:: No  Covid Evaluation: Asymptomatic - no recent exposure (last 10 days) testing not required  Diagnosis: Acute CVA (cerebrovascular accident) [facility]) [[id number]]  Admitting Physician: Imogene Burn, ERIC [[id number]]  Attending Physician: Imogene Burn, ERIC [[id number]]          B Medical/Surgery History Past Medical History:  Diagnosis Date   CKD (chronic kidney disease), stage II    nephrologist-- dr Kathrene Bongo  [facility])   Diabetes mellitus type 2, diet-controlled (HCC)    followed by pcp---   (09-20-2019  per pt does not check blood sugar at home)   Fracture of  humeral shaft, right, closed 04/25/2022   GERD (gastroesophageal reflux disease)    History of primary hyperparathyroidism    s/p  parathyroidectomy right inferior on 12-02-2012   Humerus fracture 04/25/2022   Hyperlipidemia    Hypertension    followd by nephrologist----  (09-20-2019  per pt had stress test done some time ago , unsure when/ where, thinks told ok)   OA (osteoarthritis)    Osteoporosis    Prosthetic joint infection (HCC)    followed by dr j. hatcher (ID)   left total shoulder arthroplasty 12/ 2016  ; 12/ 2020  revision with explant joint replacement and hemiarthroplasty;  completed 6 month antibiotic 05/ 2021   Vertigo    Past Surgical History:  Procedure Laterality Date   COLONOSCOPY     DILATATION & CURETTAGE/HYSTEROSCOPY WITH MYOSURE N/A 09/28/2019   Procedure: DILATATION & CURETTAGE/HYSTEROSCOPY WITH MYOSURE;  Surgeon: Olivia Mackie, MD;  Location: [facility];  Service: Gynecology;  Laterality: N/A;   EYE SURGERY  yrs ago   laser surgery for retinal tear.  (unilateral , pt unsure which side)   ORIF HUMERUS FRACTURE Right 04/27/2022   Procedure: OPEN REDUCTION INTERNAL FIXATION (ORIF) PROXIMAL HUMERUS FRACTURE;  Surgeon: Bjorn Pippin, MD;  Location: [facility];  Service: Orthopedics;  Laterality: Right;   PARATHYROIDECTOMY N/A 12/02/2012   Procedure: PARATHYROIDECTOMY with frozen section ;  Surgeon: Velora Heckler, MD;  Location: [facility];  Service: General;  Laterality: N/A;   REVERSE SHOULDER ARTHROPLASTY  Right 04/27/2022   Procedure: REVERSE SHOULDER ARTHROPLASTY;  Surgeon: Bjorn Pippin, MD;  Location: [facility];  Service: Orthopedics;  Laterality: Right;   SHOULDER INJECTION Left 07/27/2013   Procedure: SHOULDER INJECTION;  Surgeon: Loreta Ave, MD;  [facility] OR;  Service: Orthopedics;  Laterality: Left;   STERIOD INJECTION Right 02/14/2015   Procedure: RIGHT SHOULDER CORTISONE INJECTION;  Surgeon: Loreta Ave, MD;  Location[facility] OR;  Service:  Orthopedics;  Laterality: Right;   TOTAL HIP ARTHROPLASTY Left 07/27/2013   Procedure: TOTAL HIP ARTHROPLASTY ANTERIOR APPROACH;  Surgeon: Loreta Ave, MD;  [facility] OR;  Service: Orthopedics;  Laterality: Left;   TOTAL SHOULDER ARTHROPLASTY Left 02/14/2015   Procedure: LEFT TOTAL SHOULDER ARTHROPLASTY;  Surgeon: Loreta Ave, MD;  Location: [facility] OR;  Service: Orthopedics;  Laterality: Left;   TOTAL SHOULDER REVISION Left 02/09/2019   Procedure: TOTAL SHOULDER REVISION;  Surgeon: Bjorn Pippin, MD;  Location: [facility];  Service: Orthopedics;  Laterality: Left;     A IV Location/Drains/Wounds Patient Lines/Drains/Airways Status     Active Line/Drains/Airways     Name Placement date Placement time Site Days   Peripheral IV 10/12/22 18 G Anterior;Left;Proximal Antecubital 10/12/22  2009  Antecubital  less than 1   Wound / Incision (Open or Dehisced) 04/25/22 Laceration Head Left Laceration with stitches to Left forehead 04/25/22  2115  Head  170            Intake/Output Last 24 hours  Intake/Output Summary (Last 24 hours) at 10/12/2022 2344 Last data filed at 10/12/2022 2330 Gross per 24 hour  Intake 100 ml  Output --  Net 100 ml    Labs/Imaging Results for orders placed or performed during the hospital encounter of 10/12/22 (from the past 48 hour(s))  CBC with Differential     Status: Abnormal   Collection Time: 10/12/22  8:13 PM  Result Value Ref Range   WBC 8.7 4.0 - 10.5 K/uL   RBC 4.04 3.87 - 5.11 MIL/uL   Hemoglobin 11.7 (L) 12.0 - 15.0 g/dL   HCT 16.1 09.6 - 04.5 %   MCV 89.6 80.0 - 100.0 fL   MCH 29.0 26.0 - 34.0 pg   MCHC 32.3 30.0 - 36.0 g/dL   RDW 40.9 (H) 81.1 - 91.4 %   Platelets 167 150 - 400 K/uL   nRBC 0.0 0.0 - 0.2 %   Neutrophils Relative % 70 %   Neutro Abs 6.2 1.7 - 7.7 K/uL   Lymphocytes Relative 15 %   Lymphs Abs 1.3 0.7 - 4.0 K/uL   Monocytes Relative 10 %   Monocytes Absolute 0.9 0.1 - 1.0 K/uL   Eosinophils Relative 3 %   Eosinophils  Absolute 0.2 0.0 - 0.5 K/uL   Basophils Relative 1 %   Basophils Absolute 0.1 0.0 - 0.1 K/uL   Immature Granulocytes 1 %   Abs Immature Granulocytes 0.05 0.00 - 0.07 K/uL    Comment: Performed at [facility], [address] [place], [place] [address]  Comprehensive metabolic panel     Status: Abnormal   Collection Time: 10/12/22  8:13 PM  Result Value Ref Range   Sodium 138 135 - 145 mmol/L   Potassium 2.3 (LL) 3.5 - 5.1 mmol/L    Comment: CRITICAL RESULT CALLED TO, READ BACK BY AND VERIFIED WITH GREENE,K. RN @2143  10/12/22 SATRAINR   Chloride 104 98 - 111 mmol/L   CO2 22 22 - 32 mmol/L   Glucose,  Bld 112 (H) 70 - 99 mg/dL    Comment: Glucose reference range applies only to samples taken after fasting for at least 8 hours.   BUN 13 8 - 23 mg/dL   Creatinine, Ser 4.09 0.44 - 1.00 mg/dL   Calcium 81.1 (H) 8.9 - 10.3 mg/dL   Total Protein 5.7 (L) 6.5 - 8.1 g/dL   Albumin 2.9 (L) 3.5 - 5.0 g/dL   AST 16 15 - 41 U/L   ALT 9 0 - 44 U/L   Alkaline Phosphatase 65 38 - 126 U/L   Total Bilirubin 1.1 0.3 - 1.2 mg/dL   GFR, Estimated >91 >47 mL/min    Comment: (NOTE) Calculated using the CKD-EPI Creatinine Equation (2021)    Anion gap 12 5 - 15    Comment: Performed at [facility], [address] [place], [place] [address]  TSH     Status: None   Collection Time: 10/12/22  8:13 PM  Result Value Ref Range   TSH 1.293 0.350 - 4.500 uIU/mL    Comment: Performed by a 3rd Generation assay with a functional sensitivity of <=0.01 uIU/mL. Performed at [facility], [address], [place], [place] [address]   Lipase, blood     Status: None   Collection Time: 10/12/22  8:13 PM  Result Value Ref Range   Lipase 26 11 - 51 U/L    Comment: Performed at [facility], [address], [place], [place] [address]  Magnesium     Status: Abnormal   Collection Time: 10/12/22  8:13 PM  Result Value Ref Range   Magnesium 1.2 (L) 1.7 - 2.4 mg/dL    Comment: Performed at [facility], [address], [place], [place] [address]  Protime-INR     Status: None   Collection Time: 10/12/22  8:13 PM  Result Value Ref Range   Prothrombin Time 13.5 11.4 - 15.2 seconds   INR 1.0 0.8 - 1.2    Comment: (NOTE) INR goal varies based on device and disease states. Performed at [facility], [address], [place], [place] [address]   Ammonia     Status: None   Collection Time: 10/12/22  9:04 PM  Result Value Ref Range   Ammonia <10 9 - 35 umol/L    Comment: Performed at [facility], [address] [place], [place] [address]  I-Stat CG4 Lactic Acid     Status: None   Collection Time: 10/12/22  9:11 PM  Result Value Ref Range   Lactic Acid, Venous 0.8 0.5 - 1.9 mmol/L  Resp panel by RT-PCR (RSV, Flu A&B, Covid) Anterior Nasal Swab     Status: None   Collection Time: 10/12/22 10:06 PM   Specimen: Anterior Nasal Swab  Result Value Ref Range   SARS Coronavirus 2 by RT PCR NEGATIVE NEGATIVE   Influenza A by PCR NEGATIVE NEGATIVE   Influenza B by PCR NEGATIVE NEGATIVE    Comment: (NOTE) The Xpert Xpress SARS-CoV-2/FLU/RSV plus assay is intended as an aid in the diagnosis of influenza from Nasopharyngeal swab specimens and should not be used as a sole basis for treatment. Nasal washings and aspirates are unacceptable for Xpert Xpress SARS-CoV-2/FLU/RSV testing.  Fact Sheet for Patients: BloggerCourse.com  Fact Sheet for Healthcare Providers: SeriousBroker.it  This test is not yet approved or cleared by the [place] FDA and has been authorized for detection and/or diagnosis of SARS-CoV-2 by FDA under an  Emergency Use Authorization (EUA). This EUA will remain in effect (meaning this test can be used) for the duration of the COVID-19 declaration under Section 564(b)(1) of the Act, 21 U.S.C. section 360bbb-3(b)(1), unless the authorization is terminated or revoked.     Resp Syncytial Virus by  PCR NEGATIVE NEGATIVE    Comment: (NOTE) Fact Sheet for Patients: BloggerCourse.com  Fact Sheet for Healthcare Providers: SeriousBroker.it  This test is not yet approved or cleared by the [place] FDA and has been authorized for detection and/or diagnosis of SARS-CoV-2 by FDA under an Emergency Use Authorization (EUA). This EUA will remain in effect (meaning this test can be used) for the duration of the COVID-19 declaration under Section 564(b)(1) of the Act, 21 U.S.C. section 360bbb-3(b)(1), unless the authorization is terminated or revoked.  Performed at [facility], [address], [place], [place] [address]    DG Chest Portable 1 View  Result Date: 10/12/2022 CLINICAL DATA:  Speech change, worsening fatigue, decreased mobility, and cough. EXAM: PORTABLE CHEST 1 VIEW COMPARISON:  06/21/2020. FINDINGS: The heart is enlarged and the mediastinal contour is within normal limits. There is atherosclerotic calcification of the aorta. The pulmonary vasculature is distended. There is blunting of the left costophrenic angle, possible small pleural effusion with atelectasis or infiltrate. The right lung is clear. No pneumothorax. Shoulder arthroplasty changes are noted bilaterally. IMPRESSION: 1. Cardiomegaly with mild pulmonary vascular congestion. 2. Small left pleural effusion with atelectasis or infiltrate. Electronically Signed   By: Thornell Sartorius M.D.   On: 10/12/2022 22:24   MR BRAIN WO CONTRAST  Result Date: 10/12/2022 CLINICAL DATA:  Initial evaluation for neuro deficit, stroke suspected. EXAM: MRI HEAD WITHOUT CONTRAST TECHNIQUE: Multiplanar, multiecho pulse sequences of the brain and surrounding structures were obtained without intravenous contrast. COMPARISON:  Prior CT from earlier the same day. FINDINGS: Brain: Cerebral volume within normal limits for age. Patchy T2/FLAIR hyperintensity involving the periventricular deep  white matter both cerebral hemispheres, consistent with chronic small vessel ischemic disease, mild to moderate in nature. Patchy involvement of the pons noted. Subtle punctate focus of diffusion signal abnormality seen involving the posterior limb of the right internal capsule (series 5, image 75). Associated signal loss on ADC map (series 6, image 25). Findings suspicious for a tiny acute ischemic infarct, although this could potentially be artifactual given small size. No associated hemorrhage. No other evidence for acute or subacute ischemia. Gray-white matter disease a shin otherwise maintained. No areas of chronic cortical infarction. Focus of chronic hemosiderin staining present at the right occipital lobe, consistent with prior hemorrhage at this location (series 12, image 31). No acute blood seen at this location on prior head CT. No other chronic intracranial blood products. No mass lesion, midline shift or mass effect. No hydrocephalus or extra-axial fluid collection. Pituitary gland and suprasellar region within normal limits. Vascular: Major intracranial vascular flow voids are maintained. Skull and upper cervical spine: Craniocervical junction within normal limits. Bone marrow signal intensity normal. No scalp soft tissue abnormality. Sinuses/Orbits: Prior bilateral ocular lens replacement. Paranasal sinuses are clear. No significant mastoid effusion. Other: None. IMPRESSION: 1. Subtle punctate focus of diffusion signal abnormality involving the posterior limb of the right internal capsule. While this could potentially be artifactual given small size, a possible tiny acute ischemic infarct is difficult to exclude, and could be considered in the correct clinical setting. 2. No other acute intracranial abnormality. 3. Chronic hemosiderin staining at the right occipital lobe, consistent with prior hemorrhage at  this location. 4. Underlying mild to moderate chronic microvascular ischemic disease.  Electronically Signed   By: Rise Mu M.D.   On: 10/12/2022 22:21   CT HEAD WO CONTRAST ( )  Result Date: 10/12/2022 CLINICAL DATA:  Neuro deficit, acute, stroke suspected. Slurred speech, right-sided weakness EXAM: CT HEAD WITHOUT CONTRAST TECHNIQUE: Contiguous axial images were obtained from the base of the skull through the vertex without intravenous contrast. RADIATION DOSE REDUCTION: This exam was performed according to the departmental dose-optimization program which includes automated exposure control, adjustment of the mA and/or kV according to patient size and/or use of iterative reconstruction technique. COMPARISON:  Te[facility] 1624 FINDINGS: Brain: There is atrophy and chronic small vessel disease changes. No acute intracranial abnormality. Specifically, no hemorrhage, hydrocephalus, mass lesion, acute infarction, or significant intracranial injury. Vascular: No hyperdense vessel or unexpected calcification. Skull: No acute calvarial abnormality. Sinuses/Orbits: No acute findings Other: None IMPRESSION: Atrophy, chronic microvascular disease. No acute intracranial abnormality. Electronically Signed   By: Charlett Nose M.D.   On: 10/12/2022 21:19    Pending Labs Unresulted Labs (From admission, onward)     Start     Ordered   10/12/22 2328  Protein Electro, Random Urine  Once,   R        10/12/22 2327   10/12/22 2328  Protein electrophoresis, serum  Once,   R        10/12/22 2327   10/12/22 2308  Calcitriol (1,25 di-OH Vit D)  Add-on,   AD        10/12/22 2308   10/12/22 2304  VITAMIN D 25 Hydroxy (Vit-D Deficiency, Fractures)  Add-on,   AD        10/12/22 2303   10/12/22 2054  Urinalysis, w/ Reflex to Culture (Infection Suspected) -Urine, Clean Catch  Once,   URGENT       Question:  Specimen Source  Answer:  Urine, Clean Catch   10/12/22 2054   10/12/22 2054  Urine Culture  Once,   URGENT       Question:  Indication  Answer:  Altered mental status (if no other cause identified)    10/12/22 2054   Signed and Held  Lipid panel  (Labs)  Tomorrow morning,   R       Comments: Fasting    Signed and Held   Signed and Held  Hemoglobin A1c  (Labs)  Add-on,   R       Comments: To assess prior glycemic control    Signed and Held   Signed and Held  Comprehensive metabolic panel  Tomorrow morning,   R        Signed and Held   Signed and Held  Magnesium  Tomorrow morning,   R        Signed and Held            Vitals/Pain Today's Vitals   10/12/22 2100 10/12/22 2230 10/12/22 2300 10/12/22 2330  BP: 117/61 (!) 133/102 (!) 144/54 (!) 150/53  Pulse: (!) 49 (!) 54 (!) 53 (!) 58  Resp: 12 15 14  (!) 26  Temp:      TempSrc:      SpO2: 99% 98% 96% 95%  PainSc:        Isolation Precautions No active isolations  Medications Medications  potassium chloride 10 mEq in 100 mL IVPB (10 mEq Intravenous New Bag/Given 10/12/22 2331)  magnesium sulfate IVPB 2 g 50 mL (2 g Intravenous New Bag/Given 10/12/22 2330)  potassium chloride  SA (KLOR-CON M) CR tablet 40 mEq (40 mEq Oral Given 10/12/22 2334)  magnesium sulfate IVPB 2 g 50 mL (has no administration in time range)  0.9 %  sodium chloride infusion ( Intravenous New Bag/Given 10/12/22 2331)  sodium chloride 0.9 % bolus 500 mL (500 mLs Intravenous New Bag/Given 10/12/22 2122)    Mobility walks with device     Focused Assessments Neuro Assessment Handoff:  Swallow screen pass? Yes      Last date known well: 10/10/22   Neuro Assessment:   Neuro Checks:      Has TPA been given? No If patient is a Neuro Trauma and patient is going to OR before floor call report to 4N Charge nurse: [phone] or [phone]   R Recommendations: See Admitting Provider Note  Report given to:   Additional Notes: A/o x 4 150/53 98.2oral 58 22 96%ra uses walker at home fall risk takes pills 1 at a time on bag 2 of 4 pot due

## 2022-10-12 NOTE — Assessment & Plan Note (Signed)
Continue protonix 40mg daily.  

## 2022-10-12 NOTE — Assessment & Plan Note (Addendum)
Observation card/tele bed. Check echo. Check tsh, lipid panel. Asa/plavix. Neurology to consult. PT/OT consults. Neurology to decided carotid U/S vs CTA head/neck.  I do not think the patient has pneumonia despite chest x-ray findings.  She is not febrile.  White count is normal.  She has no breathing difficulties.  No antibiotics are indicated.

## 2022-10-12 NOTE — ED Provider Notes (Signed)
[place] AT [facility] Provider Note   CSN: [id number] Arrival date & time: 10/12/22  2005     History  Chief Complaint  Patient presents with   Aphasia    Tina Frank is a 82 y.o. female.  The history is provided by the patient, a relative and medical records. No language interpreter was used.  Altered Mental Status Presenting symptoms: confusion   Severity:  Moderate Episode history:  Unable to specify Timing:  Constant Progression:  Waxing and waning Chronicity:  New Associated symptoms: slurred speech   Associated symptoms: no abdominal pain, no agitation, no fever, no headaches, no light-headedness, no nausea, no palpitations, no rash, no seizures, no visual change, no vomiting and no weakness        Home Medications Prior to Admission medications   Medication Sig Start Date End Date Taking? Authorizing Provider  acetaminophen (TYLENOL) 500 MG tablet Take 500 mg by mouth every 6 (six) hours as needed.    [provider]  allopurinol (ZYLOPRIM) 300 MG tablet Take 300 mg by mouth daily.    [provider]  amoxicillin (AMOXIL) 500 MG capsule Take 2,000 mg by mouth See admin instructions. Take 2,000 mg by mouth one hour prior to dental procedures 03/22/20   [provider]  atorvastatin (LIPITOR) 10 MG tablet Take 10 mg by mouth at bedtime.     [provider]  calcium carbonate (TUMS - DOSED IN MG ELEMENTAL CALCIUM) 500 MG chewable tablet Chew 1 tablet by mouth 3 (three) times daily as needed for indigestion or heartburn.    [provider]  carvedilol (COREG) 12.5 MG tablet Take 12.5 mg by mouth See admin instructions. Take 12.5 mg by mouth in the morning and at 5 PM 12/04/14   [provider]  Cholecalciferol (VITAMIN D3) 50 MCG (2000 UT) TABS Take 2,000 Units by mouth every Monday, Wednesday, and Friday.    [provider]  cloNIDine (CATAPRES) 0.1 MG tablet Take 0.1 mg by  mouth daily as needed (for hypertension- if systolic b/p is over 160).    [provider]  denosumab (PROLIA) 60 MG/ML SOSY injection Inject 60 mg into the skin every 6 (six) months. 03/16/18   Ginnie Smart, MD  diclofenac Sodium (VOLTAREN) 1 % GEL Apply 2-4 g topically 4 (four) times daily as needed (to painful sites).    [provider]  furosemide (LASIX) 40 MG tablet Take 40 mg by mouth See admin instructions. Take 40 mg by mouth in the morning and at 5 PM 08/15/17   [provider]  hydrALAZINE (APRESOLINE) 25 MG tablet Take 25 mg by mouth See admin instructions. Take 25 mg by mouth in the morning and at 5 PM    [provider]  pantoprazole (PROTONIX) 40 MG tablet Take 40 mg by mouth See admin instructions. Take 40 mg by mouth in the morning and at 5 PM- BEFORE FOOD 08/23/18   [provider]  telmisartan (MICARDIS) 80 MG tablet Take 0.5 tablets (40 mg total) by mouth daily. Patient taking differently: Take 40 mg by mouth See admin instructions. Take 40 mg by mouth in the morning and at 5 PM 10/07/13   Jeralyn Bennett, MD      Allergies    Abaloparatide, Amlodipine, Mobic [meloxicam], Crestor [rosuvastatin], Norvasc [amlodipine besylate], Simvastatin, Sulfa antibiotics, and Zetia [ezetimibe]    Review of Systems   Review of Systems  Constitutional:  Positive for fatigue. Negative  for chills and fever.  HENT:  Negative for congestion.   Eyes:  Negative for visual disturbance.  Respiratory:  Positive for cough. Negative for chest tightness, shortness of breath and wheezing.   Cardiovascular:  Negative for chest pain and palpitations.  Gastrointestinal:  Negative for abdominal pain, constipation, diarrhea, nausea and vomiting.  Genitourinary:  Positive for decreased urine volume (darker). Negative for flank pain.  Musculoskeletal:  Negative for back pain, neck pain and neck stiffness.  Skin:  Negative for rash and wound.  Neurological:   Positive for tremors and speech difficulty. Negative for seizures, weakness, light-headedness and headaches.  Psychiatric/Behavioral:  Positive for confusion. Negative for agitation.   All other systems reviewed and are negative.   Physical Exam Updated Vital Signs BP (!) 154/140 (BP Location: Right Arm)   Pulse 80   Temp 98.2 F (36.8 C) (Oral)   Resp 20   SpO2 97%  Physical Exam Vitals and nursing note reviewed.  Constitutional:      General: She is not in acute distress.    Appearance: She is well-developed. She is not ill-appearing, toxic-appearing or diaphoretic.  HENT:     Head: Normocephalic and atraumatic.     Nose: No congestion or rhinorrhea.     Mouth/Throat:     Pharynx: No oropharyngeal exudate or posterior oropharyngeal erythema.  Eyes:     Extraocular Movements: Extraocular movements intact.     Conjunctiva/sclera: Conjunctivae normal.     Pupils: Pupils are equal, round, and reactive to light.  Cardiovascular:     Rate and Rhythm: Normal rate and regular rhythm.     Heart sounds: No murmur heard. Pulmonary:     Effort: Pulmonary effort is normal. No respiratory distress.     Breath sounds: Normal breath sounds. No wheezing, rhonchi or rales.  Chest:     Chest wall: No tenderness.  Abdominal:     Palpations: Abdomen is soft.     Tenderness: There is no abdominal tenderness. There is no guarding or rebound.  Musculoskeletal:        General: No swelling or tenderness.     Cervical back: Neck supple.  Skin:    General: Skin is warm and dry.     Capillary Refill: Capillary refill takes less than 2 seconds.     Findings: No erythema or rash.  Neurological:     Mental Status: She is alert.     Sensory: No sensory deficit.     Motor: No weakness.  Psychiatric:        Mood and Affect: Mood normal.     ED Results / Procedures / Treatments   Labs (all labs ordered are listed, but only abnormal results are displayed) Labs Reviewed  CBC WITH  DIFFERENTIAL/PLATELET - Abnormal; Notable for the following components:      Result Value   Hemoglobin 11.7 (*)    RDW 18.1 (*)    All other components within normal limits  COMPREHENSIVE METABOLIC PANEL - Abnormal; Notable for the following components:   Potassium 2.3 (*)    Glucose, Bld 112 (*)    Calcium 11.3 (*)    Total Protein 5.7 (*)    Albumin 2.9 (*)    All other components within normal limits  MAGNESIUM - Abnormal; Notable for the following components:   Magnesium 1.2 (*)    All other components within normal limits  URINE CULTURE  RESP PANEL BY RT-PCR (RSV, FLU A&B, COVID)  RVPGX2  AMMONIA  TSH  LIPASE,  BLOOD  PROTIME-INR  URINALYSIS, W/ REFLEX TO CULTURE (INFECTION SUSPECTED)  VITAMIN D 25 HYDROXY (VIT D DEFICIENCY, FRACTURES)  CALCITRIOL (1,25 DI-OH VIT D)  I-STAT CG4 LACTIC ACID, ED  I-STAT CG4 LACTIC ACID, ED    EKG EKG Interpretation Date/Time:  Sunday October 12 2022 20:08:51 EDT Ventricular Rate:  46 PR Interval:  190 QRS Duration:  87 QT Interval:  458 QTC Calculation: 401 R Axis:   33  Text Interpretation: Sinus bradycardia Atrial premature complexes Borderline T abnormalities, anterior leads similar bradycardia from prior No STEMI Confirmed by Theda Belfast ([id number]) on 10/12/2022 8:10:00 PM  Radiology DG Chest Portable 1 View  Result Date: 10/12/2022 CLINICAL DATA:  Speech change, worsening fatigue, decreased mobility, and cough. EXAM: PORTABLE CHEST 1 VIEW COMPARISON:  06/21/2020. FINDINGS: The heart is enlarged and the mediastinal contour is within normal limits. There is atherosclerotic calcification of the aorta. The pulmonary vasculature is distended. There is blunting of the left costophrenic angle, possible small pleural effusion with atelectasis or infiltrate. The right lung is clear. No pneumothorax. Shoulder arthroplasty changes are noted bilaterally. IMPRESSION: 1. Cardiomegaly with mild pulmonary vascular congestion. 2. Small left pleural  effusion with atelectasis or infiltrate. Electronically Signed   By: Thornell Sartorius M.D.   On: 10/12/2022 22:24   MR BRAIN WO CONTRAST  Result Date: 10/12/2022 CLINICAL DATA:  Initial evaluation for neuro deficit, stroke suspected. EXAM: MRI HEAD WITHOUT CONTRAST TECHNIQUE: Multiplanar, multiecho pulse sequences of the brain and surrounding structures were obtained without intravenous contrast. COMPARISON:  Prior CT from earlier the same day. FINDINGS: Brain: Cerebral volume within normal limits for age. Patchy T2/FLAIR hyperintensity involving the periventricular deep white matter both cerebral hemispheres, consistent with chronic small vessel ischemic disease, mild to moderate in nature. Patchy involvement of the pons noted. Subtle punctate focus of diffusion signal abnormality seen involving the posterior limb of the right internal capsule (series 5, image 75). Associated signal loss on ADC map (series 6, image 25). Findings suspicious for a tiny acute ischemic infarct, although this could potentially be artifactual given small size. No associated hemorrhage. No other evidence for acute or subacute ischemia. Gray-white matter disease a shin otherwise maintained. No areas of chronic cortical infarction. Focus of chronic hemosiderin staining present at the right occipital lobe, consistent with prior hemorrhage at this location (series 12, image 31). No acute blood seen at this location on prior head CT. No other chronic intracranial blood products. No mass lesion, midline shift or mass effect. No hydrocephalus or extra-axial fluid collection. Pituitary gland and suprasellar region within normal limits. Vascular: Major intracranial vascular flow voids are maintained. Skull and upper cervical spine: Craniocervical junction within normal limits. Bone marrow signal intensity normal. No scalp soft tissue abnormality. Sinuses/Orbits: Prior bilateral ocular lens replacement. Paranasal sinuses are clear. No significant  mastoid effusion. Other: None. IMPRESSION: 1. Subtle punctate focus of diffusion signal abnormality involving the posterior limb of the right internal capsule. While this could potentially be artifactual given small size, a possible tiny acute ischemic infarct is difficult to exclude, and could be considered in the correct clinical setting. 2. No other acute intracranial abnormality. 3. Chronic hemosiderin staining at the right occipital lobe, consistent with prior hemorrhage at this location. 4. Underlying mild to moderate chronic microvascular ischemic disease. Electronically Signed   By: Rise Mu M.D.   On: 10/12/2022 22:21   CT HEAD WO CONTRAST ( )  Result Date: 10/12/2022 CLINICAL DATA:  Neuro deficit, acute, stroke suspected. Slurred speech, right-sided  weakness EXAM: CT HEAD WITHOUT CONTRAST TECHNIQUE: Contiguous axial images were obtained from the base of the skull through the vertex without intravenous contrast. RADIATION DOSE REDUCTION: This exam was performed according to the departmental dose-optimization program which includes automated exposure control, adjustment of the mA and/or kV according to patient size and/or use of iterative reconstruction technique. COMPARISON:  [facility] 1624 FINDINGS: Brain: There is atrophy and chronic small vessel disease changes. No acute intracranial abnormality. Specifically, no hemorrhage, hydrocephalus, mass lesion, acute infarction, or significant intracranial injury. Vascular: No hyperdense vessel or unexpected calcification. Skull: No acute calvarial abnormality. Sinuses/Orbits: No acute findings Other: None IMPRESSION: Atrophy, chronic microvascular disease. No acute intracranial abnormality. Electronically Signed   By: Charlett Nose M.D.   On: 10/12/2022 21:19    Procedures Procedures     CRITICAL CARE Performed by: Canary Brim  Total critical care time: 35 minutes Critical care time was exclusive of separately billable procedures  and treating other patients. Critical care was necessary to treat or prevent imminent or life-threatening deterioration. Critical care was time spent personally by me on the following activities: development of treatment plan with patient and/or surrogate as well as nursing, discussions with consultants, evaluation of patient's response to treatment, examination of patient, obtaining history from patient or surrogate, ordering and performing treatments and interventions, ordering and review of laboratory studies, ordering and review of radiographic studies, pulse oximetry and re-evaluation of patient's condition.  Medications Ordered in ED Medications  potassium chloride 10 mEq in 100 mL IVPB (10 mEq Intravenous New Bag/Given 10/12/22 2238)  magnesium sulfate IVPB 2 g 50 mL (has no administration in time range)  sodium chloride 0.9 % bolus 500 mL (500 mLs Intravenous New Bag/Given 10/12/22 2122)    ED Course/ Medical Decision Making/ A&P                                 Medical Decision Making Amount and/or Complexity of Data Reviewed Labs: ordered. Radiology: ordered.  Risk Prescription drug management. Decision regarding hospitalization.    Tina Frank is a 82 y.o. female with a past medical history significant for hypertension, diabetes, osteoarthritis, hyperlipidemia, vertigo, and previous shoulder surgery who presents with 2 days of speech abnormality and 1 week of worsening fatigue and mobility problems in the setting of some mild cough, spitting up, and darkened and decreased urine.  According to patient and family, for the last week or so patient has had worsening fatigue.  She is not having difficulty getting up and getting around she lives at home alone.  She reports she started using cane and starting a walker and then over the last 2 days has barely been able to get up or stand up out of a chair.  She has had some darkened urine but denies dysuria.  She reports there is less  urine.  She reports no focal pain with no headache, neck pain, chest pain, or abdominal pain.  Nuys back or flank pain.  She denies any fevers or chills but has had some cough.  She is also had some spitting up saliva but denies significant vomiting.  She does report some nausea intermittently but denies abdominal pain.  She denies any constipation or diarrhea.  Denies any swelling and denies any rashes.  She denies any focal weakness that is generalized fatigue and weakness.  Family reports that since yesterday, she has not been sounding right.  They thought she  was having difficult time getting out what she wanted to say with only a slight slur of it.  Patient agrees that her speech is not seeming like it should be because she cannot get out what she wants to say.  This has been going on since yesterday morning at some point.  On exam, lungs were clear.  She does have a murmur.  Chest and abdomen nontender.  She had tremor in both arms and has difficulty with finger-nose-finger testing bilaterally.  She had some asterixis appearance with outstretched hands.  Family reports he does not have a tremor normally.  Pupils are symmetric and reactive with normal extract movements.  Intact sensation and strength in all 4 extremities.  Back nontender.  Neck nontender.  No carotid bruit initially.  Patient resting but has dry mucous membranes.  Will give a small amount of fluids.  Clinically concerned about either a Alterman status related to infection as family reports that her family has had UTI leading to altered mental status like this in the past.  Will get urinalysis and culture.  Will get chest x-ray with his cough.  Will get labs.  Will get a CT of the head as family reports they thought she may have had a head bleed a year ago with a fall but she has not fallen today.  No headache.  Will also get MRI of the brain given the 2 days of aphasia and possible dysarthria.  Anticipate reassessment after workup to  determine disposition.     MRI returned with evidence of small stroke.  Will call neurology.   Neurology returned my call and will come see patient for further recommendations.   Workup began to return and patient is hypokalemic, hypocalcemic, hypomagnesemic, and bradycardic.  Will give IV potassium and IV magnesium.  Medicine was called who will see patient for admission.  Family is in agreement with this plan.  X-ray showed possible pneumonia but with no white blood cell count elevation or fever or lactic acidosis, will we will hold on antibiotics at the recommendation of medicine team.  Patient will be admitted for further management.         Final Clinical Impression(s) / ED Diagnoses Final diagnoses:  Hypokalemia  Hypomagnesemia  Hypercalcemia  Cerebrovascular accident (CVA), unspecified mechanism (HCC)    Clinical Impression: 1. Hypokalemia   2. Hypomagnesemia   3. Hypercalcemia   4. Cerebrovascular accident (CVA), unspecified mechanism (HCC)     Disposition: Admit  This note was prepared with assistance of Dragon voice recognition software. Occasional wrong-word or sound-a-like substitutions may have occurred due to the inherent limitations of voice recognition software.     , Canary Brim, MD 10/12/22 2351

## 2022-10-12 NOTE — Assessment & Plan Note (Signed)
Check A1c 

## 2022-10-12 NOTE — ED Notes (Signed)
Notified RN of low HR

## 2022-10-12 NOTE — H&P (Signed)
History and Physical    Tina Frank ZOX:[id number] DOB: 11/14/1940 DOA: 10/12/2022  DOS: the patient was seen and examined on 10/12/2022  PCP: Sigmund Hazel, MD   Patient coming from: Home  I have personally briefly reviewed patient's old medical records in [facility]  CC: word finding difficulty, weakness HPI:  82 year old female prior history of hypertension, diabetes, hyperlipidemia, history of gout presents to the ER today with a 2-day history of worsening weakness, 1 day history of worsening word finding.  Patient lives home alone.  Family is planning on moving her out of her home and into assisted living.  Son Brett Canales is at the bedside.  He states the patient was having difficulty with worsening weakness 2 days ago.  Patient had her daughter-in-law stay with her on Saturday night.  On Sunday, the son noticed the patient is having difficulty with word finding.  Patient is brought into the ER.  Patient states that she has noticed some increase in urinary frequency but cannot quantify it as she wears adult diapers.  Patient's had normal screening tests for cancer.  She has no personal history of cancer.  She has only been sporadically taking her vitamin D supplements.  She takes 1 or 2 tablets of Tums a day for reflux.  She does not use excessive amounts of calcium.  She does not eat a lot of cheese, milk.  Arrival to the ER temp 90.2 heart rate 80 blood pressure 154/140.  White count 8.7, hemoglobin 1.7, platelets of 167  Magnesium 1.2 Sodium 138, potassium 2.1, chloride 104, bicarb 22, BUN of 13, creatinine 0.76, glucose 112  Calcium 11.3, albumin 2.9, total protein 5.7, corrected calcium of 12.8  TSH normal at 1.29  CT head shows no acute intracranial abnormality.  MRI of the brain demonstrated diffusion signal abnormality involving the posterior limb of the right internal capsule.  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  Patient did have  a fall back in 2023 with a subarachnoid hemorrhage in the right occipital lobe.  Chest x-ray is cardiomegaly.  Small left pleural effusion with atelectasis.  EDP has consulted neurology.  [facility] hospitalist contacted for admission.   ED Course: k 2.3, Mag 1.2, calcium 11.3, MRI shows CVA  Review of Systems:  Review of Systems  Constitutional: Negative.   HENT: Negative.    Eyes: Negative.   Respiratory: Negative.    Cardiovascular: Negative.   Genitourinary:  Positive for frequency.  Skin: Negative.   Neurological:  Positive for weakness.       +word finding  All other systems reviewed and are negative.   Past Medical History:  Diagnosis Date   CKD (chronic kidney disease), stage II    nephrologist-- dr Kathrene Bongo  [facility])   Diabetes mellitus type 2, diet-controlled (HCC)    followed by pcp---   (09-20-2019  per pt does not check blood sugar at home)   Fracture of humeral shaft, right, closed 04/25/2022   GERD (gastroesophageal reflux disease)    History of primary hyperparathyroidism    s/p  parathyroidectomy right inferior on 12-02-2012   Humerus fracture 04/25/2022   Hyperlipidemia    Hypertension    followd by nephrologist----  (09-20-2019  per pt had stress test done some time ago , unsure when/ where, thinks told ok)   OA (osteoarthritis)    Osteoporosis    Prosthetic joint infection (HCC)    followed by dr j. hatcher ([facility])   left total  shoulder arthroplasty 12/ 2016  ; 12/ 2020  revision with explant joint replacement and hemiarthroplasty;  completed 6 month antibiotic 05/ 2021   Vertigo     Past Surgical History:  Procedure Laterality Date   COLONOSCOPY     DILATATION & CURETTAGE/HYSTEROSCOPY WITH MYOSURE N/A 09/28/2019   Procedure: DILATATION & CURETTAGE/HYSTEROSCOPY WITH MYOSURE;  Surgeon: Olivia Mackie, MD;  Location: [facility];  Service: Gynecology;  Laterality: N/A;   EYE SURGERY  yrs ago   laser surgery for retinal tear.   (unilateral , pt unsure which side)   ORIF HUMERUS FRACTURE Right 04/27/2022   Procedure: OPEN REDUCTION INTERNAL FIXATION (ORIF) PROXIMAL HUMERUS FRACTURE;  Surgeon: Bjorn Pippin, MD;  Location: [facility];  Service: Orthopedics;  Laterality: Right;   PARATHYROIDECTOMY N/A 12/02/2012   Procedure: PARATHYROIDECTOMY with frozen section ;  Surgeon: Velora Heckler, MD;  Location: [facility];  Service: General;  Laterality: N/A;   REVERSE SHOULDER ARTHROPLASTY Right 04/27/2022   Procedure: REVERSE SHOULDER ARTHROPLASTY;  Surgeon: Bjorn Pippin, MD;  Location: [facility];  Service: Orthopedics;  Laterality: Right;   SHOULDER INJECTION Left 07/27/2013   Procedure: SHOULDER INJECTION;  Surgeon: Loreta Ave, MD;  Location: [facility];  Service: Orthopedics;  Laterality: Left;   STERIOD INJECTION Right 02/14/2015   Procedure: RIGHT SHOULDER CORTISONE INJECTION;  Surgeon: Loreta Ave, MD;  Location: [facility];  Service: Orthopedics;  Laterality: Right;   TOTAL HIP ARTHROPLASTY Left 07/27/2013   Procedure: TOTAL HIP ARTHROPLASTY ANTERIOR APPROACH;  Surgeon: Loreta Ave, MD;  Location: [facility] OR;  Service: Orthopedics;  Laterality: Left;   TOTAL SHOULDER ARTHROPLASTY Left 02/14/2015   Procedure: LEFT TOTAL SHOULDER ARTHROPLASTY;  Surgeon: Loreta Ave, MD;  Location: [facility] OR;  Service: Orthopedics;  Laterality: Left;   TOTAL SHOULDER REVISION Left 02/09/2019   Procedure: TOTAL SHOULDER REVISION;  Surgeon: Bjorn Pippin, MD;  Location: [facility];  Service: Orthopedics;  Laterality: Left;     reports that she quit smoking about 34 years ago. Her smoking use included cigarettes. She started smoking about 54 years ago. She has a 20 pack-year smoking history. She has never used smokeless tobacco. She reports that she does not drink alcohol and does not use drugs.  Allergies  Allergen Reactions   Abaloparatide Other (See Comments)    "Burred vision, lightheaded" (patient does not recall this in 2024)   Mobic [Meloxicam]  Hypertension   Crestor [Rosuvastatin] Other (See Comments)    Muscle aches   Norvasc [Amlodipine Besylate] Swelling and Other (See Comments)    Ankle swelling   Simvastatin Other (See Comments)    Muscle aches  Other Reaction(s): high LFTs   Sulfa Antibiotics Rash   Zetia [Ezetimibe] Other (See Comments)    Muscle aches    Family History  Problem Relation Age of Onset   Heart failure Mother    Cancer Mother    Breast cancer Mother        in her 58s   Cancer Father     Prior to Admission medications   Medication Sig Start Date End Date Taking? Authorizing Provider  acetaminophen (TYLENOL) 500 MG tablet Take 500 mg by mouth every 6 (six) hours as needed.    [provider]  allopurinol (ZYLOPRIM) 300 MG tablet Take 300 mg by mouth daily.    [provider]  amoxicillin (AMOXIL) 500 MG capsule Take 2,000 mg by mouth See admin instructions. Take 2,000 mg by mouth one hour prior  to dental procedures 03/22/20   [provider]  atorvastatin (LIPITOR) 10 MG tablet Take 10 mg by mouth at bedtime.     [provider]  calcium carbonate (TUMS - DOSED IN MG ELEMENTAL CALCIUM) 500 MG chewable tablet Chew 1 tablet by mouth 3 (three) times daily as needed for indigestion or heartburn.    [provider]  carvedilol (COREG) 12.5 MG tablet Take 12.5 mg by mouth See admin instructions. Take 12.5 mg by mouth in the morning and at 5 PM 12/04/14   [provider]  Cholecalciferol (VITAMIN D3) 50 MCG (2000 UT) TABS Take 2,000 Units by mouth every Monday, Wednesday, and Friday.    [provider]  cloNIDine (CATAPRES) 0.1 MG tablet Take 0.1 mg by mouth daily as needed (for hypertension- if systolic b/p is over 160).    [provider]  denosumab (PROLIA) 60 MG/ML SOSY injection Inject 60 mg into the skin every 6 (six) months. 03/16/18   Ginnie Smart, MD  diclofenac Sodium (VOLTAREN) 1 % GEL Apply 2-4 g topically 4 (four) times  daily as needed (to painful sites).    [provider]  furosemide (LASIX) 40 MG tablet Take 40 mg by mouth See admin instructions. Take 40 mg by mouth in the morning and at 5 PM 08/15/17   [provider]  hydrALAZINE (APRESOLINE) 25 MG tablet Take 25 mg by mouth See admin instructions. Take 25 mg by mouth in the morning and at 5 PM    [provider]  pantoprazole (PROTONIX) 40 MG tablet Take 40 mg by mouth See admin instructions. Take 40 mg by mouth in the morning and at 5 PM- BEFORE FOOD 08/23/18   [provider]  telmisartan (MICARDIS) 80 MG tablet Take 0.5 tablets (40 mg total) by mouth daily. Patient taking differently: Take 40 mg by mouth See admin instructions. Take 40 mg by mouth in the morning and at 5 PM 10/07/13   Jeralyn Bennett, MD    Physical Exam: Vitals:   10/12/22 2045 10/12/22 2100 10/12/22 2230 10/12/22 2300  BP: 106/69 117/61 (!) 133/102   Pulse: (!) 50 (!) 49 (!) 54   Resp: 13 12 15 14   Temp:      TempSrc:      SpO2: 99% 99% 98%     Physical Exam Vitals and nursing note reviewed.  Constitutional:      General: She is not in acute distress.    Appearance: She is not toxic-appearing.     Comments: Hard of hearing  HENT:     Head: Normocephalic and atraumatic.     Nose: Nose normal.  Cardiovascular:     Rate and Rhythm: Regular rhythm. Bradycardia present.     Pulses: Normal pulses.  Pulmonary:     Effort: Pulmonary effort is normal.     Breath sounds: Normal breath sounds.  Abdominal:     General: Bowel sounds are normal. There is no distension.     Palpations: Abdomen is soft.     Tenderness: There is no abdominal tenderness.  Musculoskeletal:     Right lower leg: No edema.     Left lower leg: No edema.  Skin:    General: Skin is warm and dry.     Capillary Refill: Capillary refill takes less than 2 seconds.  Neurological:     General: No focal deficit present.     Mental Status: She is alert and oriented to person,  place, and time.  Labs on Admission: I have personally reviewed following labs and imaging studies  CBC: Recent Labs  Lab 10/12/22 2013  WBC 8.7  NEUTROABS 6.2  HGB 11.7*  HCT 36.2  MCV 89.6  PLT 167   Basic Metabolic Panel: Recent Labs  Lab 10/12/22 2013  NA 138  K 2.3*  CL 104  CO2 22  GLUCOSE 112*  BUN 13  CREATININE 0.76  CALCIUM 11.3*  MG 1.2*   GFR: CrCl cannot be calculated (Unknown ideal weight.). Liver Function Tests: Recent Labs  Lab 10/12/22 2013  AST 16  ALT 9  ALKPHOS 65  BILITOT 1.1  PROT 5.7*  ALBUMIN 2.9*   Recent Labs  Lab 10/12/22 2013  LIPASE 26   Recent Labs  Lab 10/12/22 2104  AMMONIA <10   Coagulation Profile: Recent Labs  Lab 10/12/22 2013  INR 1.0   Thyroid Function Tests: Recent Labs    10/12/22 2013  TSH 1.293   Urine analysis:    Component Value Date/Time   COLORURINE YELLOW 10/05/2013 2122   APPEARANCEUR CLEAR 10/05/2013 2122   LABSPEC 1.007 10/05/2013 2122   PHURINE 7.0 10/05/2013 2122   GLUCOSEU NEGATIVE 10/05/2013 2122   HGBUR NEGATIVE 10/05/2013 2122   BILIRUBINUR NEGATIVE 10/05/2013 2122   KETONESUR NEGATIVE 10/05/2013 2122   PROTEINUR NEGATIVE 10/05/2013 2122   UROBILINOGEN 0.2 10/05/2013 2122   NITRITE NEGATIVE 10/05/2013 2122   LEUKOCYTESUR NEGATIVE 10/05/2013 2122    Radiological Exams on Admission: I have personally reviewed images DG Chest Portable 1 View  Result Date: 10/12/2022 CLINICAL DATA:  Speech change, worsening fatigue, decreased mobility, and cough. EXAM: PORTABLE CHEST 1 VIEW COMPARISON:  06/21/2020. FINDINGS: The heart is enlarged and the mediastinal contour is within normal limits. There is atherosclerotic calcification of the aorta. The pulmonary vasculature is distended. There is blunting of the left costophrenic angle, possible small pleural effusion with atelectasis or infiltrate. The right lung is clear. No pneumothorax. Shoulder arthroplasty changes are noted bilaterally.  IMPRESSION: 1. Cardiomegaly with mild pulmonary vascular congestion. 2. Small left pleural effusion with atelectasis or infiltrate. Electronically Signed   By: Thornell Sartorius M.D.   On: 10/12/2022 22:24   MR BRAIN WO CONTRAST  Result Date: 10/12/2022 CLINICAL DATA:  Initial evaluation for neuro deficit, stroke suspected. EXAM: MRI HEAD WITHOUT CONTRAST TECHNIQUE: Multiplanar, multiecho pulse sequences of the brain and surrounding structures were obtained without intravenous contrast. COMPARISON:  Prior CT from earlier the same day. FINDINGS: Brain: Cerebral volume within normal limits for age. Patchy T2/FLAIR hyperintensity involving the periventricular deep white matter both cerebral hemispheres, consistent with chronic small vessel ischemic disease, mild to moderate in nature. Patchy involvement of the pons noted. Subtle punctate focus of diffusion signal abnormality seen involving the posterior limb of the right internal capsule (series 5, image 75). Associated signal loss on ADC map (series 6, image 25). Findings suspicious for a tiny acute ischemic infarct, although this could potentially be artifactual given small size. No associated hemorrhage. No other evidence for acute or subacute ischemia. Gray-white matter disease a shin otherwise maintained. No areas of chronic cortical infarction. Focus of chronic hemosiderin staining present at the right occipital lobe, consistent with prior hemorrhage at this location (series 12, image 31). No acute blood seen at this location on prior head CT. No other chronic intracranial blood products. No mass lesion, midline shift or mass effect. No hydrocephalus or extra-axial fluid collection. Pituitary gland and suprasellar region within normal limits. Vascular: Major intracranial vascular flow voids are  maintained. Skull and upper cervical spine: Craniocervical junction within normal limits. Bone marrow signal intensity normal. No scalp soft tissue abnormality.  Sinuses/Orbits: Prior bilateral ocular lens replacement. Paranasal sinuses are clear. No significant mastoid effusion. Other: None. IMPRESSION: 1. Subtle punctate focus of diffusion signal abnormality involving the posterior limb of the right internal capsule. While this could potentially be artifactual given small size, a possible tiny acute ischemic infarct is difficult to exclude, and could be considered in the correct clinical setting. 2. No other acute intracranial abnormality. 3. Chronic hemosiderin staining at the right occipital lobe, consistent with prior hemorrhage at this location. 4. Underlying mild to moderate chronic microvascular ischemic disease. Electronically Signed   By: Rise Mu M.D.   On: 10/12/2022 22:21   CT HEAD WO CONTRAST ( )  Result Date: 10/12/2022 CLINICAL DATA:  Neuro deficit, acute, stroke suspected. Slurred speech, right-sided weakness EXAM: CT HEAD WITHOUT CONTRAST TECHNIQUE: Contiguous axial images were obtained from the base of the skull through the vertex without intravenous contrast. RADIATION DOSE REDUCTION: This exam was performed according to the departmental dose-optimization program which includes automated exposure control, adjustment of the mA and/or kV according to patient size and/or use of iterative reconstruction technique. COMPARISON:  [facility]24 FINDINGS: Brain: There is atrophy and chronic small vessel disease changes. No acute intracranial abnormality. Specifically, no hemorrhage, hydrocephalus, mass lesion, acute infarction, or significant intracranial injury. Vascular: No hyperdense vessel or unexpected calcification. Skull: No acute calvarial abnormality. Sinuses/Orbits: No acute findings Other: None IMPRESSION: Atrophy, chronic microvascular disease. No acute intracranial abnormality. Electronically Signed   By: Charlett Nose M.D.   On: 10/12/2022 21:19    EKG: My personal interpretation of EKG shows: sinus  bradycardia    Assessment/Plan Principal Problem:   Acute CVA (cerebrovascular accident) (HCC) Active Problems:   Hypokalemia   Hypercalcemia   Hypomagnesemia   Essential hypertension   DM (diabetes mellitus) (HCC)   Hyperlipidemia   Positive anti-CCP test   Sinus bradycardia   DNR (do not resuscitate)/DNI(Do Not Intubate)   Reflux esophagitis    Assessment and Plan: * Acute CVA (cerebrovascular accident) (HCC) Observation card/tele bed. Check echo. Check tsh, lipid panel. Asa/plavix. Neurology to consult. PT/OT consults. Neurology to decided carotid U/S vs CTA head/neck.  I do not think the patient has pneumonia despite chest x-ray findings.  She is not febrile.  White count is normal.  She has no breathing difficulties.  No antibiotics are indicated.  Hypomagnesemia Replete aggressively with IV kcl and IV mag  Hypercalcemia Unclear the cause of her hypercalcemic. Pt thinks she may be urinating more frequently but pt wears adult diapers.  Check vitamin D level and calcitriol levels. Pt states she had not been compliant with taking her vitamin D supplements(only 2000 units). Takes only 1 tums per day.  Pt with hx of positive anti-ccp per rheumatology notes. Unclear the cause. Pt is not taking any RA treatments. Will start with IV hydration and follow serum and ionized calcium. If remains elevated, consider IV Zometa +/- IM/SQ calcitonin.  May need neoplastic workup. Son(steve) states pt has had routine cancer screenings include mammograms and colonoscopy that have been negative. No personal hx of cancers.  Hypokalemia Replete aggressively with IV kcl and IV mag  DNR (do not resuscitate)/DNI(Do Not Intubate) Verified with pt's son Brett Canales that pt is a Dnr/DNI.  Sinus bradycardia Stable. Hold coreg.  Positive anti-CCP test Noted in rheumatology notes. Unclear the significance of her positive anti-CCP test. May need referral back  to rheumatology.  Hyperlipidemia Check lipid  panel. Start lipitor 40 mg daily.  DM (diabetes mellitus) (HCC) Check A1c.  Essential hypertension Hold HTN meds. Allow for permissive hypertension. Keep BP <220/110.  Reflux esophagitis Continue protonix 40 mg daily.   DVT prophylaxis: SQ Heparin Code Status: DNR/DNI(Do NOT Intubate). Verified with pt's son Brett Canales Family Communication: discussed with pt and son Brett Canales at bedside  Disposition Plan: return home  Consults called: EDP has consulted neurology  Admission status: Observation, Telemetry bed   Carollee Herter, DO [facility] 10/12/2022, 11:20 PM

## 2022-10-12 NOTE — Assessment & Plan Note (Signed)
Check lipid panel. Start lipitor 40 mg daily.

## 2022-10-12 NOTE — Assessment & Plan Note (Signed)
Noted in rheumatology notes. Unclear the significance of her positive anti-CCP test. May need referral back to rheumatology.

## 2022-10-12 NOTE — Assessment & Plan Note (Signed)
Verified with pt's son Brett Canales that pt is a Dnr/DNI.

## 2022-10-13 ENCOUNTER — Observation Stay (HOSPITAL_COMMUNITY): Payer: Medicare PPO

## 2022-10-13 DIAGNOSIS — R4701 Aphasia: Secondary | ICD-10-CM | POA: Diagnosis present

## 2022-10-13 DIAGNOSIS — I771 Stricture of artery: Secondary | ICD-10-CM | POA: Diagnosis not present

## 2022-10-13 DIAGNOSIS — E785 Hyperlipidemia, unspecified: Secondary | ICD-10-CM | POA: Diagnosis present

## 2022-10-13 DIAGNOSIS — Z66 Do not resuscitate: Secondary | ICD-10-CM | POA: Diagnosis present

## 2022-10-13 DIAGNOSIS — Z96642 Presence of left artificial hip joint: Secondary | ICD-10-CM | POA: Diagnosis present

## 2022-10-13 DIAGNOSIS — E876 Hypokalemia: Secondary | ICD-10-CM | POA: Diagnosis present

## 2022-10-13 DIAGNOSIS — I700 Atherosclerosis of aorta: Secondary | ICD-10-CM | POA: Diagnosis not present

## 2022-10-13 DIAGNOSIS — B961 Klebsiella pneumoniae [K. pneumoniae] as the cause of diseases classified elsewhere: Secondary | ICD-10-CM | POA: Diagnosis present

## 2022-10-13 DIAGNOSIS — I509 Heart failure, unspecified: Secondary | ICD-10-CM | POA: Diagnosis present

## 2022-10-13 DIAGNOSIS — I63231 Cerebral infarction due to unspecified occlusion or stenosis of right carotid arteries: Secondary | ICD-10-CM | POA: Diagnosis present

## 2022-10-13 DIAGNOSIS — I6389 Other cerebral infarction: Secondary | ICD-10-CM

## 2022-10-13 DIAGNOSIS — Z79899 Other long term (current) drug therapy: Secondary | ICD-10-CM | POA: Diagnosis not present

## 2022-10-13 DIAGNOSIS — Z888 Allergy status to other drugs, medicaments and biological substances status: Secondary | ICD-10-CM | POA: Diagnosis not present

## 2022-10-13 DIAGNOSIS — Z886 Allergy status to analgesic agent status: Secondary | ICD-10-CM | POA: Diagnosis not present

## 2022-10-13 DIAGNOSIS — J9601 Acute respiratory failure with hypoxia: Secondary | ICD-10-CM | POA: Diagnosis not present

## 2022-10-13 DIAGNOSIS — R410 Disorientation, unspecified: Secondary | ICD-10-CM | POA: Diagnosis not present

## 2022-10-13 DIAGNOSIS — I6523 Occlusion and stenosis of bilateral carotid arteries: Secondary | ICD-10-CM | POA: Diagnosis not present

## 2022-10-13 DIAGNOSIS — I130 Hypertensive heart and chronic kidney disease with heart failure and stage 1 through stage 4 chronic kidney disease, or unspecified chronic kidney disease: Secondary | ICD-10-CM | POA: Diagnosis present

## 2022-10-13 DIAGNOSIS — G9341 Metabolic encephalopathy: Secondary | ICD-10-CM | POA: Diagnosis present

## 2022-10-13 DIAGNOSIS — N182 Chronic kidney disease, stage 2 (mild): Secondary | ICD-10-CM | POA: Diagnosis present

## 2022-10-13 DIAGNOSIS — N390 Urinary tract infection, site not specified: Secondary | ICD-10-CM | POA: Diagnosis present

## 2022-10-13 DIAGNOSIS — Z1152 Encounter for screening for COVID-19: Secondary | ICD-10-CM | POA: Diagnosis not present

## 2022-10-13 DIAGNOSIS — R4182 Altered mental status, unspecified: Secondary | ICD-10-CM | POA: Diagnosis present

## 2022-10-13 DIAGNOSIS — I639 Cerebral infarction, unspecified: Secondary | ICD-10-CM | POA: Diagnosis not present

## 2022-10-13 DIAGNOSIS — E1122 Type 2 diabetes mellitus with diabetic chronic kidney disease: Secondary | ICD-10-CM | POA: Diagnosis present

## 2022-10-13 DIAGNOSIS — Z882 Allergy status to sulfonamides status: Secondary | ICD-10-CM | POA: Diagnosis not present

## 2022-10-13 DIAGNOSIS — I672 Cerebral atherosclerosis: Secondary | ICD-10-CM | POA: Diagnosis not present

## 2022-10-13 DIAGNOSIS — E860 Dehydration: Secondary | ICD-10-CM | POA: Diagnosis present

## 2022-10-13 DIAGNOSIS — Z87891 Personal history of nicotine dependence: Secondary | ICD-10-CM | POA: Diagnosis not present

## 2022-10-13 LAB — COMPREHENSIVE METABOLIC PANEL
ALT: 10 U/L (ref 0–44)
AST: 14 U/L — ABNORMAL LOW (ref 15–41)
Albumin: 2.6 g/dL — ABNORMAL LOW (ref 3.5–5.0)
Alkaline Phosphatase: 56 U/L (ref 38–126)
Anion gap: 9 (ref 5–15)
BUN: 11 mg/dL (ref 8–23)
CO2: 21 mmol/L — ABNORMAL LOW (ref 22–32)
Calcium: 10.3 mg/dL (ref 8.9–10.3)
Chloride: 108 mmol/L (ref 98–111)
Creatinine, Ser: 0.92 mg/dL (ref 0.44–1.00)
GFR, Estimated: >60 mL/min (ref >60)
Glucose, Bld: 84 mg/dL (ref 70–99)
Potassium: 3.2 mmol/L — ABNORMAL LOW (ref 3.5–5.1)
Sodium: 138 mmol/L (ref 135–145)
Total Bilirubin: 1.2 mg/dL (ref 0.3–1.2)
Total Protein: 5.1 g/dL — ABNORMAL LOW (ref 6.5–8.1)

## 2022-10-13 LAB — ECHOCARDIOGRAM COMPLETE
Area-P 1/2: 3.51 cm2
Height: 65 in
S' Lateral: 2.4 cm
Weight: 2480 oz

## 2022-10-13 LAB — LIPID PANEL
Cholesterol: 121 mg/dL (ref 0–200)
HDL: 44 mg/dL (ref >40)
LDL Cholesterol: 62 mg/dL (ref 0–99)
Total CHOL/HDL Ratio: 2.8 RATIO
Triglycerides: 74 mg/dL (ref <150)
VLDL: 15 mg/dL (ref 0–40)

## 2022-10-13 LAB — PHOSPHORUS: Phosphorus: 2.3 mg/dL — ABNORMAL LOW (ref 2.5–4.6)

## 2022-10-13 LAB — HEMOGLOBIN A1C
Hgb A1c MFr Bld: 6 % — ABNORMAL HIGH (ref 4.8–5.6)
Mean Plasma Glucose: 125.5 mg/dL

## 2022-10-13 LAB — MAGNESIUM: Magnesium: 1.9 mg/dL (ref 1.7–2.4)

## 2022-10-13 MED ORDER — LABETALOL HCL 5 MG/ML IV SOLN: 10.0000 mg | INTRAVENOUS | Status: DC | PRN

## 2022-10-13 MED ORDER — SENNOSIDES-DOCUSATE SODIUM 8.6-50 MG PO TABS: 1.0000 | ORAL_TABLET | Freq: Every evening | ORAL | Status: DC | PRN

## 2022-10-13 MED ORDER — SODIUM CHLORIDE 0.9 % IV SOLN
1.0000 g | INTRAVENOUS | Status: DC
  Administered 2022-10-13 – 2022-10-14 (×2): 1 g via INTRAVENOUS
  Filled 2022-10-13 (×2): qty 10

## 2022-10-13 MED ORDER — TRAZODONE HCL 50 MG PO TABS
50.0000 mg | ORAL_TABLET | Freq: Every evening | ORAL | Status: DC | PRN
  Administered 2022-10-13 – 2022-10-15 (×2): 50 mg via ORAL
  Filled 2022-10-13 (×4): qty 1

## 2022-10-13 MED ORDER — IOHEXOL 350 MG/ML SOLN
75.0000 mL | Freq: Once | INTRAVENOUS | Status: AC | PRN
  Administered 2022-10-13: 75 mL via INTRAVENOUS

## 2022-10-13 MED ORDER — ACETAMINOPHEN 650 MG RE SUPP: 650.0000 mg | RECTAL | Status: DC | PRN

## 2022-10-13 MED ORDER — SODIUM CHLORIDE 0.9 % IV SOLN: INTRAVENOUS | Status: DC

## 2022-10-13 MED ORDER — ONDANSETRON HCL 4 MG/2ML IJ SOLN: 4.0000 mg | Freq: Four times a day (QID) | INTRAMUSCULAR | Status: DC | PRN

## 2022-10-13 MED ORDER — POTASSIUM CHLORIDE 10 MEQ/100ML IV SOLN
10.0000 meq | INTRAVENOUS | Status: AC
  Administered 2022-10-13 (×2): 10 meq via INTRAVENOUS
  Filled 2022-10-13 (×3): qty 100

## 2022-10-13 MED ORDER — ATORVASTATIN CALCIUM 10 MG PO TABS
10.0000 mg | ORAL_TABLET | Freq: Every day | ORAL | Status: DC
  Administered 2022-10-13 – 2022-10-16 (×4): 10 mg via ORAL
  Filled 2022-10-13 (×6): qty 1

## 2022-10-13 MED ORDER — STROKE: EARLY STAGES OF RECOVERY BOOK
Freq: Once | Status: AC
  Filled 2022-10-13: qty 1

## 2022-10-13 MED ORDER — PANTOPRAZOLE SODIUM 40 MG PO TBEC
40.0000 mg | DELAYED_RELEASE_TABLET | Freq: Two times a day (BID) | ORAL | Status: DC
  Administered 2022-10-13 – 2022-10-17 (×10): 40 mg via ORAL
  Filled 2022-10-13 (×10): qty 1

## 2022-10-13 MED ORDER — CLOPIDOGREL BISULFATE 75 MG PO TABS: 75.0000 mg | ORAL_TABLET | Freq: Every day | ORAL | Status: DC

## 2022-10-13 MED ORDER — IPRATROPIUM-ALBUTEROL 0.5-2.5 (3) MG/3ML IN SOLN
3.0000 mL | RESPIRATORY_TRACT | Status: DC | PRN
  Administered 2022-10-14: 3 mL via RESPIRATORY_TRACT
  Filled 2022-10-13: qty 3

## 2022-10-13 MED ORDER — ASPIRIN 81 MG PO TBEC
81.0000 mg | DELAYED_RELEASE_TABLET | Freq: Every day | ORAL | Status: DC
  Administered 2022-10-13 – 2022-10-17 (×5): 81 mg via ORAL
  Filled 2022-10-13 (×5): qty 1

## 2022-10-13 MED ORDER — ALLOPURINOL 100 MG PO TABS
300.0000 mg | ORAL_TABLET | Freq: Every day | ORAL | Status: DC
  Administered 2022-10-13 – 2022-10-17 (×5): 300 mg via ORAL
  Filled 2022-10-13 (×5): qty 3

## 2022-10-13 MED ORDER — POTASSIUM CHLORIDE CRYS ER 20 MEQ PO TBCR
20.0000 meq | EXTENDED_RELEASE_TABLET | ORAL | Status: AC
  Administered 2022-10-13 (×2): 20 meq via ORAL
  Filled 2022-10-13 (×2): qty 1

## 2022-10-13 MED ORDER — HEPARIN SODIUM (PORCINE) 5000 UNIT/ML IJ SOLN
5000.0000 [IU] | Freq: Three times a day (TID) | INTRAMUSCULAR | Status: DC
  Administered 2022-10-13 – 2022-10-17 (×14): 5000 [IU] via SUBCUTANEOUS
  Filled 2022-10-13 (×14): qty 1

## 2022-10-13 MED ORDER — SODIUM PHOSPHATES 45 MMOLE/15ML IV SOLN
30.0000 mmol | Freq: Once | INTRAVENOUS | Status: AC
  Administered 2022-10-13: 30 mmol via INTRAVENOUS
  Filled 2022-10-13: qty 10

## 2022-10-13 MED ORDER — ACETAMINOPHEN 160 MG/5ML PO SOLN: 650.0000 mg | ORAL | Status: DC | PRN

## 2022-10-13 MED ORDER — ACETAMINOPHEN 325 MG PO TABS
650.0000 mg | ORAL_TABLET | ORAL | Status: DC | PRN
  Administered 2022-10-13 – 2022-10-16 (×6): 650 mg via ORAL
  Filled 2022-10-13 (×7): qty 2

## 2022-10-13 NOTE — ED Notes (Signed)
Report called to floor pt to 3w bed 9 RN aware K bag 3 is running and bag 4 needs given as well as 2nd dose of Mag at 2am

## 2022-10-13 NOTE — Consult Note (Signed)
NEUROLOGY CONSULTATION NOTE   Date of service: October 13, 2022 Patient Name: Tina Frank MRN:  [id number] DOB:  [birth year] Reason for consult: "Generalized weakness, punctate stroke" Requesting Provider: Carollee Herter, DO _ _ _   _ __   _ __ _ _  __ __   _ __   __ _  History of Present Illness  Tina Frank is a 82 y.o. female with PMH significant for DM2, HTN, HLD, Osteoporosis who presents with 1-2 weeks of progressive generalized walker. Having to use cane, walker and now having trouble getting up. Family also noted some slurred speech over the last day and brought her to the hospital.  Workup notable for hypokalemia, MRI brain with a small punctate stroke in the R posterior Internal capsule. She denies prior hx of strokes, no hx of Afibb that she is aware of.  LKW: 1 week ago mRS: 2 tNKASE: not offered, unlikely for the stroke to explain her symptoms and outside windwo Thrombectomy: not offered, low suspicion for LVO NIHSS components Score: Comment  1a Level of Conscious 0[x]  1[]  2[]  3[]      1b LOC Questions 0[x]  1[]  2[]       1c LOC Commands 0[x]  1[]  2[]       2 Best Gaze 0[x]  1[]  2[]       3 Visual 0[x]  1[]  2[]  3[]      4 Facial Palsy 0[x]  1[]  2[]  3[]      5a Motor Arm - left 0[]  1[x]  2[]  3[]  4[]  UN[]    5b Motor Arm - Right 0[]  1[x]  2[]  3[]  4[]  UN[]    6a Motor Leg - Left 0[]  1[]  2[x]  3[]  4[]  UN[]    6b Motor Leg - Right 0[]  1[]  2[x]  3[]  4[]  UN[]    7 Limb Ataxia 0[x]  1[]  2[]  3[]  UN[]     8 Sensory 0[x]  1[]  2[]  UN[]      9 Best Language 0[x]  1[]  2[]  3[]      10 Dysarthria 0[x]  1[]  2[]  UN[]      11 Extinct. and Inattention 0[x]  1[]  2[]       TOTAL: 6       ROS   Constitutional Denies weight loss, fever and chills.   HEENT Denies changes in vision and hearing.   Respiratory Denies SOB and cough.   CV Denies palpitations and CP   GI Denies abdominal pain, nausea, vomiting and diarrhea.   GU Denies dysuria and urinary frequency.   MSK Denies myalgia and joint pain.   Skin  Denies rash and pruritus.   Neurological Denies headache and syncope.   Psychiatric Denies recent changes in mood. Denies anxiety and depression.    Past History   Past Medical History:  Diagnosis Date   CKD (chronic kidney disease), stage II    nephrologist-- dr Kathrene Bongo  [facility])   Diabetes mellitus type 2, diet-controlled (HCC)    followed by pcp---   (09-20-2019  per pt does not check blood sugar at home)   Fracture of humeral shaft, right, closed 04/25/2022   GERD (gastroesophageal reflux disease)    History of primary hyperparathyroidism    s/p  parathyroidectomy right inferior on 12-02-2012   Humerus fracture 04/25/2022   Hyperlipidemia    Hypertension    followd by nephrologist----  (09-20-2019  per pt had stress test done some time ago , unsure when/ where, thinks told ok)   OA (osteoarthritis)    Osteoporosis    Prosthetic joint infection (HCC)    followed by dr j. hatcher ([place])  left total shoulder arthroplasty 12/ 2016  ; 12/ 2020  revision with explant joint replacement and hemiarthroplasty;  completed 6 month antibiotic 05/ 2021   Vertigo    Past Surgical History:  Procedure Laterality Date   COLONOSCOPY     DILATATION & CURETTAGE/HYSTEROSCOPY WITH MYOSURE N/A 09/28/2019   Procedure: DILATATION & CURETTAGE/HYSTEROSCOPY WITH MYOSURE;  Surgeon: Olivia Mackie, MD;  Location: [facility] [place];  Service: Gynecology;  Laterality: N/A;   EYE SURGERY  yrs ago   laser surgery for retinal tear.  (unilateral , pt unsure which side)   ORIF HUMERUS FRACTURE Right 04/27/2022   Procedure: OPEN REDUCTION INTERNAL FIXATION (ORIF) PROXIMAL HUMERUS FRACTURE;  Surgeon: Bjorn Pippin, MD;  Location: [facility];  Service: Orthopedics;  Laterality: Right;   PARATHYROIDECTOMY N/A 12/02/2012   Procedure: PARATHYROIDECTOMY with frozen section ;  Surgeon: Velora Heckler, MD;  Location: [facility];  Service: General;  Laterality: N/A;   REVERSE SHOULDER ARTHROPLASTY Right  04/27/2022   Procedure: REVERSE SHOULDER ARTHROPLASTY;  Surgeon: Bjorn Pippin, MD;  Location: [facility];  Service: Orthopedics;  Laterality: Right;   SHOULDER INJECTION Left 07/27/2013   Procedure: SHOULDER INJECTION;  Surgeon: Loreta Ave, MD;  Location: [facility] OR;  Service: Orthopedics;  Laterality: Left;   STERIOD INJECTION Right 02/14/2015   Procedure: RIGHT SHOULDER CORTISONE INJECTION;  Surgeon: Loreta Ave, MD;  Location: [facility] OR;  Service: Orthopedics;  Laterality: Right;   TOTAL HIP ARTHROPLASTY Left 07/27/2013   Procedure: TOTAL HIP ARTHROPLASTY ANTERIOR APPROACH;  Surgeon: Loreta Ave, MD;  Location: [facility] OR;  Service: Orthopedics;  Laterality: Left;   TOTAL SHOULDER ARTHROPLASTY Left 02/14/2015   Procedure: LEFT TOTAL SHOULDER ARTHROPLASTY;  Surgeon: Loreta Ave, MD;  Location: [facility];  Service: Orthopedics;  Laterality: Left;   TOTAL SHOULDER REVISION Left 02/09/2019   Procedure: TOTAL SHOULDER REVISION;  Surgeon: Bjorn Pippin, MD;  Location: [facility];  Service: Orthopedics;  Laterality: Left;   Family History  Problem Relation Age of Onset   Heart failure Mother    Cancer Mother    Breast cancer Mother        in her 74s   Cancer Father    Social History   Socioeconomic History   Marital status: Widowed    Spouse name: Not on file   Number of children: Not on file   Years of education: Not on file   Highest education level: Not on file  Occupational History   Not on file  Tobacco Use   Smoking status: Former    Current packs/day: 0.00    Average packs/day: 1 pack/day for 20.0 years (20.0 ttl pk-yrs)    Types: Cigarettes    Start date: 10/28/1967    Quit date: 10/28/1987    Years since quitting: 34.9   Smokeless tobacco: Never  Vaping Use   Vaping status: Never Used  Substance and Sexual Activity   Alcohol use: No   Drug use: Never   Sexual activity: Yes    Birth control/protection: Post-menopausal  Other Topics Concern   Not on file  Social History Narrative    Not on file   Social Determinants of Health   Financial Resource Strain: Not on file  Food Insecurity: Not on file  Transportation Needs: Not on file  Physical Activity: Not on file  Stress: Not on file  Social Connections: Not on file   Allergies  Allergen Reactions   Abaloparatide Other (See Comments)    "Burred  vision, lightheaded" (patient does not recall this in 2024)   Mobic [Meloxicam] Hypertension   Crestor [Rosuvastatin] Other (See Comments)    Muscle aches   Norvasc [Amlodipine Besylate] Swelling and Other (See Comments)    Ankle swelling   Simvastatin Other (See Comments)    Muscle aches  Other Reaction(s): high LFTs   Sulfa Antibiotics Rash   Zetia [Ezetimibe] Other (See Comments)    Muscle aches    Medications  (Not in a hospital admission)    Vitals   Vitals:   10/12/22 2230 10/12/22 2300 10/12/22 2330 10/13/22 0030  BP: (!) 133/102 (!) 144/54 (!) 150/53 (!) 149/53  Pulse: (!) 54 (!) 53 (!) 58 60  Resp: 15 14 (!) 26 (!) 28  Temp:      TempSrc:      SpO2: 98% 96% 95% 98%     There is no height or weight on file to calculate BMI.  Physical Exam   General: Laying comfortably in bed; in no acute distress.  HENT: dry oropharynx and mucosa. Normal external appearance of ears and nose.  Neck: Supple, no pain or tenderness  CV: No JVD. No peripheral edema.  Pulmonary: Symmetric Chest rise. Normal respiratory effort.  Abdomen: Soft to touch, non-tender.  Ext: No cyanosis, edema, or deformity  Skin: No rash. Normal palpation of skin.   Musculoskeletal: Normal digits and nails by inspection. No clubbing.   Neurologic Examination  Mental status/Cognition: Alert, oriented to self, place, month and year, good attention.  Speech/language: Fluent, comprehension intact, object naming intact, repetition intact.  Cranial nerves:   CN II Pupils equal and reactive to light, no VF deficits    CN III,IV,VI EOM intact, no gaze preference or deviation, no  nystagmus    CN V normal sensation in V1, V2, and V3 segments bilaterally    CN VII no asymmetry, no nasolabial fold flattening    CN VIII normal hearing to speech    CN IX & X normal palatal elevation, no uvular deviation    CN XI 5/5 head turn and 5/5 shoulder shrug bilaterally   CN XII midline tongue protrusion    Motor:  Muscle bulk: poor, tone normal Mvmt Root Nerve  Muscle Right Left Comments  SA C5/6 Ax Deltoid 4 4   EF C5/6 Mc Biceps 4+ 4+   EE C6/7/8 Rad Triceps 4+ 4+   WF C6/7 Med FCR     WE C7/8 PIN ECU     F Ab C8/T1 U ADM/FDI 4+ 4+   HF L1/2/3 Fem Illopsoas 3 3   KE L2/3/4 Fem Quad 4 4   DF L4/5 D Peron Tib Ant 5 5   PF S1/2 Tibial Grc/Sol 5 5    Reflexes:  Right Left Comments  Pectoralis      Biceps (C5/6) 2 2   Brachioradialis (C5/6) 2 2    Triceps (C6/7) 2 2    Patellar (L3/4) 2 2    Achilles (S1)      Hoffman      Plantar     Jaw jerk    Sensation:  Light touch Intact throughout   Pin prick    Temperature    Vibration   Proprioception    Coordination/Complex Motor:  - Finger to Nose intact BL - Heel to shin unable to do due to weakness. - Rapid alternating movement are slowed BL - Gait: deferred for patient safety given falls.  Labs   CBC:  Recent Labs  Lab  10/12/22 2013  WBC 8.7  NEUTROABS 6.2  HGB 11.7*  HCT 36.2  MCV 89.6  PLT 167    Basic Metabolic Panel:  Lab Results  Component Value Date   NA 138 10/12/2022   K 2.3 (LL) 10/12/2022   CO2 22 10/12/2022   GLUCOSE 112 (H) 10/12/2022   BUN 13 10/12/2022   CREATININE 0.76 10/12/2022   CALCIUM 11.3 (H) 10/12/2022   GFRNONAA >60 10/12/2022   GFRAA >60 04/11/2019   Lipid Panel: No results found for: "LDLCALC" HgbA1c:  Lab Results  Component Value Date   HGBA1C 5.9 (H) 07/19/2019   Urine Drug Screen: No results found for: "LABOPIA", "COCAINSCRNUR", "LABBENZ", "AMPHETMU", "THCU", "LABBARB"  Alcohol Level No results found for: "ETH"  CT Head without contrast(Personally  reviewed): CTH was negative for a large hypodensity concerning for a large territory infarct or hyperdensity concerning for an ICH  CT angio Head and Neck with contrast: pending  MRI Brain(Personally reviewed): Small punctate R IC stroke  Impression   Yali Birkeland is a 82 y.o. female with PMH significant for DM2, HTN, HLD, Osteoporosis who presents with 1-2 weeks of progressive generalized walker. Having to use cane, walker and now having trouble getting up. Family also noted some slurred speech over the last day and brought her to the hospital. Her neurologic examination is notable for dry oropharynx and generalized weakness in all extremities.  Unlikely for the noted punctate R IC stroke to explain her symptoms. Likely etiology of her generalized weakness is hypokalemia. She also has significantly dry oropharynx and mucosa which may explain her earlier noted slurred speech.  Recommendations  - Frequent Neuro checks per stroke unit protocol - Recommend Vascular imaging with CTA head and neck - Recommend obtaining TTE - Recommend obtaining Lipid panel with LDL - Please start statin if LDL > 70 - Recommend HbA1c to evaluate for diabetes and how well it is controlled. - Antithrombotic - aspirin 81mg  daily. - Recommend DVT ppx - SBP goal - permissive hypertension first 24 h < 220/110. Held home meds.  - Recommend Telemetry monitoring for arrythmia - Recommend bedside swallow screen prior to PO intake. - Stroke education booklet - Recommend PT/OT/SLP consult  ______________________________________________________________________   Thank you for the opportunity to take part in the care of this patient. If you have any further questions, please contact the neurology consultation attending.  Signed,  Erick Blinks [facility] _ _ _   _ __   _ __ _ _  __ __   _ __   __ _

## 2022-10-13 NOTE — Evaluation (Signed)
Speech Language Pathology Evaluation Patient Details Name: Tina Frank MRN: [id number] DOB: 04/14/1940 Today's Date: 10/13/2022 Time: 8657-8469 SLP Time Calculation (min) (ACUTE ONLY): 19 min  Problem List:  Patient Active Problem List   Diagnosis Date Noted   Acute CVA (cerebrovascular accident) (HCC) 10/12/2022   Hypercalcemia 10/12/2022   Hypomagnesemia 10/12/2022   Positive anti-CCP test 10/12/2022   Sinus bradycardia 10/12/2022   DNR (do not resuscitate)/DNI(Do Not Intubate) 10/12/2022   Arthritis of left ankle 08/15/2020   Benign neoplasm of adrenal gland 08/15/2020   Cyst of kidney, acquired 08/15/2020   Gastroesophageal reflux disease 08/15/2020   Hardening of the aorta (main artery of the heart) (HCC) 08/15/2020   History of primary hyperparathyroidism 08/15/2020   Gout 08/15/2020   Joint pain 08/15/2020   Oral phase dysphagia 08/15/2020   Osteoporosis 08/15/2020   Prediabetes 08/15/2020   Presence of left artificial shoulder joint 08/15/2020   Pure hypercholesterolemia 08/15/2020   Visual disturbance 08/15/2020   Vitamin D deficiency 08/15/2020   Voice hoarseness 08/15/2020   Chronic arthropathy 04/25/2020   Decubitus skin ulcer 04/12/2019   Hypokalemia 03/15/2019   Prosthetic joint infection (HCC) 02/09/2019   Degenerative joint disease of left shoulder 02/14/2015   Pre-syncope 10/06/2013   Leukocytosis 10/06/2013   Hypotension 10/06/2013   Osteoarthritis 07/27/2013   Essential hypertension 10/28/2011   DM (diabetes mellitus) (HCC) 10/28/2011   Hyperlipidemia 10/28/2011   Reflux esophagitis    Herpes zoster    Shingles    Past Medical History:  Past Medical History:  Diagnosis Date   CKD (chronic kidney disease), stage II    nephrologist-- dr Kathrene Bongo  [facility])   Diabetes mellitus type 2, diet-controlled (HCC)    followed by pcp---   (09-20-2019  per pt does not check blood sugar at home)   Fracture of humeral shaft, right, closed  04/25/2022   GERD (gastroesophageal reflux disease)    History of primary hyperparathyroidism    s/p  parathyroidectomy right inferior on 12-02-2012   Humerus fracture 04/25/2022   Hyperlipidemia    Hypertension    followd by nephrologist----  (09-20-2019  per pt had stress test done some time ago , unsure when/ where, thinks told ok)   OA (osteoarthritis)    Osteoporosis    Prosthetic joint infection (HCC)    followed by dr j. hatcher ([facility])   left total shoulder arthroplasty 12/ 2016  ; 12/ 2020  revision with explant joint replacement and hemiarthroplasty;  completed 6 month antibiotic 05/ 2021   Vertigo    Past Surgical History:  Past Surgical History:  Procedure Laterality Date   COLONOSCOPY     DILATATION & CURETTAGE/HYSTEROSCOPY WITH MYOSURE N/A 09/28/2019   Procedure: DILATATION & CURETTAGE/HYSTEROSCOPY WITH MYOSURE;  Surgeon: Olivia Mackie, MD;  Location: [facility] [place];  Service: Gynecology;  Laterality: N/A;   EYE SURGERY  yrs ago   laser surgery for retinal tear.  (unilateral , pt unsure which side)   ORIF HUMERUS FRACTURE Right 04/27/2022   Procedure: OPEN REDUCTION INTERNAL FIXATION (ORIF) PROXIMAL HUMERUS FRACTURE;  Surgeon: Bjorn Pippin, MD;  Location: [facility];  Service: Orthopedics;  Laterality: Right;   PARATHYROIDECTOMY N/A 12/02/2012   Procedure: PARATHYROIDECTOMY with frozen section ;  Surgeon: Velora Heckler, MD;  Location: [facility];  Service: General;  Laterality: N/A;   REVERSE SHOULDER ARTHROPLASTY Right 04/27/2022   Procedure: REVERSE SHOULDER ARTHROPLASTY;  Surgeon: Bjorn Pippin, MD;  Location: [facility];  Service: Orthopedics;  Laterality: Right;  SHOULDER INJECTION Left 07/27/2013   Procedure: SHOULDER INJECTION;  Surgeon: Loreta Ave, MD;  Location: [facility] OR;  Service: Orthopedics;  Laterality: Left;   STERIOD INJECTION Right 02/14/2015   Procedure: RIGHT SHOULDER CORTISONE INJECTION;  Surgeon: Loreta Ave, MD;  Location: [facility] OR;  Service:  Orthopedics;  Laterality: Right;   TOTAL HIP ARTHROPLASTY Left 07/27/2013   Procedure: TOTAL HIP ARTHROPLASTY ANTERIOR APPROACH;  Surgeon: Loreta Ave, MD;  Location: [facility] OR;  Service: Orthopedics;  Laterality: Left;   TOTAL SHOULDER ARTHROPLASTY Left 02/14/2015   Procedure: LEFT TOTAL SHOULDER ARTHROPLASTY;  Surgeon: Loreta Ave, MD;  Location: [facility];  Service: Orthopedics;  Laterality: Left;   TOTAL SHOULDER REVISION Left 02/09/2019   Procedure: TOTAL SHOULDER REVISION;  Surgeon: Bjorn Pippin, MD;  Location: [facility];  Service: Orthopedics;  Laterality: Left;   HPI:  Tina Frank is an 82 yo female presenting to ED 8/4 with two day history of worsening weakness and word finding difficulty. MRI Brain with subtle punctate focus of diffusion signal abnormality involving posterior limb of R internal capsule. PMH includes CKD, T2DM, GERD, HLD, HTN, OA, vertigo   Assessment / Plan / Recommendation Clinical Impression  Pt reports living at home alone, where she receives some assistance with medications and finances after undergoing surgery in February 2024. Pt and her son report noticing no acute cognitive differences, although have observed word finding difficulties. Pt presents with mild dysarthria, likely due to moderate R sided weakness. Due to this weakness, the clock drawing portion of the SLUMS was not administered. Out of the remaining 26 points, pt scored a 9 characterized by deficits related to sustained attention, memory, awarenes, and problem solving. Pt correctly named 13 animals in a one minute period, although note frequent repetitions and one instance of a paraphasic response. When asked to remember five objects for a delayed recall task, pt was perseverative on naming their semantic categories, rather than storing the words themselves. Although pt reports no acute concerns, pt will benefit from ongoing ST intervention to target these deficits through functional activities as she presents  different from baseline. Will f/u as able.    SLP Assessment  SLP Recommendation/Assessment: Patient needs continued Speech Lanaguage Pathology Services SLP Visit Diagnosis: Dysarthria and anarthria (R47.1);Aphasia (R47.01);Cognitive communication deficit (R41.841)    Recommendations for follow up therapy are one component of a multi-disciplinary discharge planning process, led by the attending physician.  Recommendations may be updated based on patient status, additional functional criteria and insurance authorization.    Follow Up Recommendations  Skilled nursing-short term rehab (<3 hours/day)    Assistance Recommended at Discharge  Frequent or constant Supervision/Assistance  Functional Status Assessment Patient has had a recent decline in their functional status and demonstrates the ability to make significant improvements in function in a reasonable and predictable amount of time.  Frequency and Duration min 2x/week  2 weeks      SLP Evaluation Cognition  Overall Cognitive Status: Impaired/Different from baseline Arousal/Alertness: Awake/alert Orientation Level: Oriented X4 Attention: Sustained Sustained Attention: Impaired Sustained Attention Impairment: Verbal basic Memory: Impaired Memory Impairment: Storage deficit;Retrieval deficit Awareness: Impaired Awareness Impairment: Intellectual impairment Problem Solving: Impaired Problem Solving Impairment: Verbal basic       Comprehension  Auditory Comprehension Overall Auditory Comprehension: Appears within functional limits for tasks assessed    Expression Expression Primary Mode of Expression: Verbal Verbal Expression Overall Verbal Expression: Impaired Naming: Impairment Divergent: 50-74% accurate Written Expression Dominant Hand: Right   Oral /  Motor  Oral Motor/Sensory Function Overall Oral Motor/Sensory Function: Mild impairment Facial ROM: Reduced right Facial Symmetry: Abnormal symmetry right Facial  Strength: Reduced right Facial Sensation: Reduced right Lingual ROM: Reduced right Lingual Symmetry: Abnormal symmetry right Lingual Strength: Reduced Lingual Sensation: Reduced Motor Speech Overall Motor Speech: Impaired Respiration: Within functional limits Phonation: Normal Resonance: Within functional limits Articulation: Impaired Level of Impairment: Sentence Intelligibility: Intelligibility reduced Sentence: 75-100% accurate Motor Planning: Witnin functional limits            Gwynneth Aliment, M.A., CF-SLP Speech Language Pathology, Acute Rehabilitation Services  Secure Chat preferred [phone]  10/13/2022, 9:31 AM

## 2022-10-13 NOTE — Plan of Care (Signed)
progressing 

## 2022-10-13 NOTE — Progress Notes (Signed)
PROGRESS NOTE    Tina Frank  NUU:[id number] DOB: [birth year] DOA: 10/12/2022 PCP: Sigmund Hazel, MD   Brief Narrative:  82 year old with history of HTN, diabetes, HLD, gout comes to the ED with complaints of 2 days of progressive weakness and some word finding difficulty.  Upon admission noted to have multiple electrolyte abnormality including hypokalemia, hypomagnesemia and hypocalcemia.  MRI brain showed acute CVA and patient was admitted to the hospital   Assessment & Plan:  Principal Problem:   Acute CVA (cerebrovascular accident) [facility]) Active Problems:   Hypokalemia   Hypercalcemia   Hypomagnesemia   Essential hypertension   DM (diabetes mellitus) (HCC)   Hyperlipidemia   Positive anti-CCP test   Sinus bradycardia   DNR (do not resuscitate)/DNI(Do Not Intubate)   Reflux esophagitis     Assessment and Plan: * Acute CVA (cerebrovascular accident) (HCC), small punctate right ICA stroke CT head and MRI brain consistent with acute CVA.  CTA head and neck negative for acute LVO but does show some carotid disease Echocardiogram Lipid panel, TSH, A1c 6.0 Echocardiogram PT/OT Speech and swallow eval  Urinary tract infection - Send urine cultures.  Empirically start IV Rocephin  Hypokalemia/hypomagnesemia/hypophosphatemia Aggressive repletion.    Hypercalcemia From dehydration, improved with fluids  Sinus bradycardia Stable. Hold coreg.  IV as needed  Positive anti-CCP test Noted in rheumatology notes. Unclear the significance of her positive anti-CCP test.  Patient will need to coordinate this to follow-up outpatient rheumatology  Hyperlipidemia Lipitor  DM (diabetes mellitus) (HCC) A1c 6.0.  Essential hypertension Permissive hypertension  Reflux esophagitis Continue protonix 40 mg daily.   DVT prophylaxis: Subcu heparin Code Status: DNR Family Communication: Son at bedside Ongoing stroke workup and electrolyte repletion.  Hopefully discharge tomorrow  pending therapy evaluation       Diet Orders (From admission, onward)     Start     Ordered   10/13/22 0330  Diet heart healthy/carb modified Room service appropriate? Yes; Fluid consistency: Thin  Diet effective now       Question Answer Comment  Diet-HS Snack? Nothing   Room service appropriate? Yes   Fluid consistency: Thin      10/13/22 0329            Subjective: Feels little better.  Slightly stronger but still not yet at baseline.   Examination:  General exam: Appears calm and comfortable  Respiratory system: Clear to auscultation. Respiratory effort normal. Cardiovascular system: S1 & S2 heard, RRR. No JVD, murmurs, rubs, gallops or clicks. No pedal edema. Gastrointestinal system: Abdomen is nondistended, soft and nontender. No organomegaly or masses felt. Normal bowel sounds heard. Central nervous system: Alert and oriented. No focal neurological deficits. Extremities: Symmetric 4 x 5 power. Skin: No rashes, lesions or ulcers Psychiatry: Judgement and insight appear normal. Mood & affect appropriate.  Objective: Vitals:   10/13/22 0149 10/13/22 0200 10/13/22 0350 10/13/22 0741  BP: (!) 130/94  (!) 135/93 (!) 121/48  Pulse: 61  60 (!) 59  Resp: 18  18 17   Temp: 98.1 F (36.7 C)  98 F (36.7 C) 98.8 F (37.1 C)  TempSrc: Oral  Oral Oral  SpO2: 96%  96% 94%  Weight:  70.3 kg    Height:  5\' 5"  (1.651 m)      Intake/Output Summary (Last 24 hours) at 10/13/2022 0808 Last data filed at 10/13/2022 0059 Gross per 24 hour  Intake 1250 ml  Output --  Net 1250 ml   [employer]  10/13/22 0200  Weight: 70.3 kg    Scheduled Meds:   stroke: early stages of recovery book   Does not apply Once   allopurinol  300 mg Oral Daily   aspirin EC  81 mg Oral Daily   atorvastatin  10 mg Oral QHS   heparin  5,000 Units Subcutaneous Q8H   pantoprazole  40 mg Oral BID WC   Continuous Infusions:  sodium chloride 100 mL/hr at 10/13/22 0203    Nutritional status      Body mass index is 25.79 kg/m.  Data Reviewed:   CBC: Recent Labs  Lab 10/12/22 2013  WBC 8.7  NEUTROABS 6.2  HGB 11.7*  HCT 36.2  MCV 89.6  PLT 167   Basic Metabolic Panel: Recent Labs  Lab 10/12/22 2013  NA 138  K 2.3*  CL 104  CO2 22  GLUCOSE 112*  BUN 13  CREATININE 0.76  CALCIUM 11.3*  MG 1.2*   GFR: Estimated Creatinine Clearance: 52.4 mL/min (by C-G formula based on SCr of 0.76 mg/dL). Liver Function Tests: Recent Labs  Lab 10/12/22 2013  AST 16  ALT 9  ALKPHOS 65  BILITOT 1.1  PROT 5.7*  ALBUMIN 2.9*   Recent Labs  Lab 10/12/22 2013  LIPASE 26   Recent Labs  Lab 10/12/22 2104  AMMONIA <10   Coagulation Profile: Recent Labs  Lab 10/12/22 2013  INR 1.0   Cardiac Enzymes: No results for input(s): "CKTOTAL", "CKMB", "CKMBINDEX", "TROPONINI" in the last 168 hours. BNP (last 3 results) No results for input(s): "PROBNP" in the last 8760 hours. HbA1C: Recent Labs    10/13/22 0030  HGBA1C 6.0*   CBG: No results for input(s): "GLUCAP" in the last 168 hours. Lipid Profile: No results for input(s): "CHOL", "HDL", "LDLCALC", "TRIG", "CHOLHDL", "LDLDIRECT" in the last 72 hours. Thyroid Function Tests: Recent Labs    10/12/22 2013  TSH 1.293   Anemia Panel: No results for input(s): "VITAMINB12", "FOLATE", "FERRITIN", "TIBC", "IRON", "RETICCTPCT" in the last 72 hours. Sepsis Labs: Recent Labs  Lab 10/12/22 2111  LATICACIDVEN 0.8    Recent Results (from the past 240 hour(s))  Resp panel by RT-PCR (RSV, Flu A&B, Covid) Anterior Nasal Swab     Status: None   Collection Time: 10/12/22 10:06 PM   Specimen: Anterior Nasal Swab  Result Value Ref Range Status   SARS Coronavirus 2 by RT PCR NEGATIVE NEGATIVE Final   Influenza A by PCR NEGATIVE NEGATIVE Final   Influenza B by PCR NEGATIVE NEGATIVE Final    Comment: (NOTE) The Xpert Xpress SARS-CoV-2/FLU/RSV plus assay is intended as an aid in the diagnosis of influenza from  Nasopharyngeal swab specimens and should not be used as a sole basis for treatment. Nasal washings and aspirates are unacceptable for Xpert Xpress SARS-CoV-2/FLU/RSV testing.  Fact Sheet for Patients: BloggerCourse.com  Fact Sheet for Healthcare Providers: SeriousBroker.it  This test is not yet approved or cleared by the [place] FDA and has been authorized for detection and/or diagnosis of SARS-CoV-2 by FDA under an Emergency Use Authorization (EUA). This EUA will remain in effect (meaning this test can be used) for the duration of the COVID-19 declaration under Section 564(b)(1) of the Act, 21 U.S.C. section 360bbb-3(b)(1), unless the authorization is terminated or revoked.     Resp Syncytial Virus by PCR NEGATIVE NEGATIVE Final    Comment: (NOTE) Fact Sheet for Patients: BloggerCourse.com  Fact Sheet for Healthcare Providers: SeriousBroker.it  This test is not yet approved or cleared  by the [place] and has been authorized for detection and/or diagnosis of SARS-CoV-2 by FDA under an Emergency Use Authorization (EUA). This EUA will remain in effect (meaning this test can be used) for the duration of the COVID-19 declaration under Section 564(b)(1) of the Act, 21 U.S.C. section 360bbb-3(b)(1), unless the authorization is terminated or revoked.  Performed at [facility], [address], [place], [place] [address]          Radiology Studies: CT ANGIO HEAD NECK W WO CM  Result Date: 10/13/2022 CLINICAL DATA:  Initial evaluation for neuro deficit, stroke. EXAM: CT ANGIOGRAPHY HEAD AND NECK WITH AND WITHOUT CONTRAST TECHNIQUE: Multidetector CT imaging of the head and neck was performed using the standard protocol during bolus administration of intravenous contrast. Multiplanar CT image reconstructions and MIPs were obtained to evaluate the vascular anatomy.  Carotid stenosis measurements (when applicable) are obtained utilizing NASCET criteria, using the distal internal carotid diameter as the denominator. RADIATION DOSE REDUCTION: This exam was performed according to the departmental dose-optimization program which includes automated exposure control, adjustment of the mA and/or kV according to patient size and/or use of iterative reconstruction technique. CONTRAST:  75mL OMNIPAQUE IOHEXOL 350 MG/ML SOLN COMPARISON:  Prior CT and MRI from 10/12/2022. FINDINGS: CTA NECK FINDINGS Aortic arch: Partially visualized aortic arch within normal limits for caliber. Bovine branching pattern noted. Moderate atheromatous change about the arch and origin great vessels. No hemodynamically significant stenosis. Right carotid system: Right common and internal carotid arteries are tortuous but patent without dissection. Bulky calcified plaque about the right carotid bulb with associated stenosis of up to 50% by NASCET criteria. Left carotid system: Left common and internal carotid arteries are tortuous but patent without dissection. Bulky calcified plaque about the left carotid bulb without hemodynamically significant greater than 50% stenosis. Vertebral arteries: Both vertebral arteries arise from subclavian arteries. No significant proximal subclavian artery stenosis. Atheromatous plaque at the origins of both vertebral arteries with associated moderate stenoses bilaterally. Vertebral arteries otherwise patent without stenosis or dissection. Skeleton: No discrete or worrisome osseous lesions. Moderately advanced cervical spondylosis. Other neck: No other acute finding. Few scattered thyroid nodules, largest of which measures 1.5 cm on the left (series 7, image 37). Upper chest: Suspected emphysema. Superimposed scattered interlobular septal thickening consistent with pulmonary interstitial congestion. Review of the MIP images confirms the above findings CTA HEAD FINDINGS Anterior  circulation: Scattered atheromatous change about the carotid siphons without hemodynamically significant stenosis. A1 segments patent bilaterally. Normal anterior knee artery complex. Anterior cerebral arteries patent without stenosis. No M1 stenosis or occlusion. No proximal SCA branch occlusion or high-grade stenosis. Distal MCA branches perfused and symmetric. Posterior circulation: Both V4 segments patent without significant stenosis. Both PICA patent. Basilar widely patent without stenosis. Superior cerebellar and posterior cerebral arteries patent bilaterally without stenosis. Venous sinuses: Patent allowing for timing the contrast bolus. Anatomic variants: None significant.  No aneurysm. Review of the MIP images confirms the above findings IMPRESSION: 1. Negative CTA for large vessel occlusion or other emergent finding. 2. Bulky calcified plaque about the right carotid bulb with associated stenosis of up to 50% by NASCET criteria. 3. Atheromatous plaque at the origins of both vertebral arteries with associated moderate stenoses bilaterally. 4. Diffuse tortuosity of the major arterial vasculature of the head and neck, suggesting chronic underlying hypertension. 5. Mild pulmonary interstitial edema/congestion. 6. Few scattered thyroid nodules, largest of which measures 1.5 cm on the left. Further evaluation with dedicated thyroid ultrasound recommended.  This could be performed on a nonemergent outpatient basis. (Ref: J Am Coll Radiol. 2015 Feb;12(2): 143-50). Aortic Atherosclerosis (ICD10-I70.0) and Emphysema (ICD10-J43.9). Electronically Signed   By: Rise Mu M.D.   On: 10/13/2022 02:26   DG Chest Portable 1 View  Result Date: 10/12/2022 CLINICAL DATA:  Speech change, worsening fatigue, decreased mobility, and cough. EXAM: PORTABLE CHEST 1 VIEW COMPARISON:  06/21/2020. FINDINGS: The heart is enlarged and the mediastinal contour is within normal limits. There is atherosclerotic calcification of  the aorta. The pulmonary vasculature is distended. There is blunting of the left costophrenic angle, possible small pleural effusion with atelectasis or infiltrate. The right lung is clear. No pneumothorax. Shoulder arthroplasty changes are noted bilaterally. IMPRESSION: 1. Cardiomegaly with mild pulmonary vascular congestion. 2. Small left pleural effusion with atelectasis or infiltrate. Electronically Signed   By: Thornell Sartorius M.D.   On: 10/12/2022 22:24   MR BRAIN WO CONTRAST  Result Date: 10/12/2022 CLINICAL DATA:  Initial evaluation for neuro deficit, stroke suspected. EXAM: MRI HEAD WITHOUT CONTRAST TECHNIQUE: Multiplanar, multiecho pulse sequences of the brain and surrounding structures were obtained without intravenous contrast. COMPARISON:  Prior CT from earlier the same day. FINDINGS: Brain: Cerebral volume within normal limits for age. Patchy T2/FLAIR hyperintensity involving the periventricular deep white matter both cerebral hemispheres, consistent with chronic small vessel ischemic disease, mild to moderate in nature. Patchy involvement of the pons noted. Subtle punctate focus of diffusion signal abnormality seen involving the posterior limb of the right internal capsule (series 5, image 75). Associated signal loss on ADC map (series 6, image 25). Findings suspicious for a tiny acute ischemic infarct, although this could potentially be artifactual given small size. No associated hemorrhage. No other evidence for acute or subacute ischemia. Gray-white matter disease a shin otherwise maintained. No areas of chronic cortical infarction. Focus of chronic hemosiderin staining present at the right occipital lobe, consistent with prior hemorrhage at this location (series 12, image 31). No acute blood seen at this location on prior head CT. No other chronic intracranial blood products. No mass lesion, midline shift or mass effect. No hydrocephalus or extra-axial fluid collection. Pituitary gland and  suprasellar region within normal limits. Vascular: Major intracranial vascular flow voids are maintained. Skull and upper cervical spine: Craniocervical junction within normal limits. Bone marrow signal intensity normal. No scalp soft tissue abnormality. Sinuses/Orbits: Prior bilateral ocular lens replacement. Paranasal sinuses are clear. No significant mastoid effusion. Other: None. IMPRESSION: 1. Subtle punctate focus of diffusion signal abnormality involving the posterior limb of the right internal capsule. While this could potentially be artifactual given small size, a possible tiny acute ischemic infarct is difficult to exclude, and could be considered in the correct clinical setting. 2. No other acute intracranial abnormality. 3. Chronic hemosiderin staining at the right occipital lobe, consistent with prior hemorrhage at this location. 4. Underlying mild to moderate chronic microvascular ischemic disease. Electronically Signed   By: Rise Mu M.D.   On: 10/12/2022 22:21   CT HEAD WO CONTRAST ( )  Result Date: 10/12/2022 CLINICAL DATA:  Neuro deficit, acute, stroke suspected. Slurred speech, right-sided weakness EXAM: CT HEAD WITHOUT CONTRAST TECHNIQUE: Contiguous axial images were obtained from the base of the skull through the vertex without intravenous contrast. RADIATION DOSE REDUCTION: This exam was performed according to the departmental dose-optimization program which includes automated exposure control, adjustment of the mA and/or kV according to patient size and/or use of iterative reconstruction technique. COMPARISON:  [facility] FINDINGS: Brain: There is atrophy  and chronic small vessel disease changes. No acute intracranial abnormality. Specifically, no hemorrhage, hydrocephalus, mass lesion, acute infarction, or significant intracranial injury. Vascular: No hyperdense vessel or unexpected calcification. Skull: No acute calvarial abnormality. Sinuses/Orbits: No acute findings Other:  None IMPRESSION: Atrophy, chronic microvascular disease. No acute intracranial abnormality. Electronically Signed   By: Charlett Nose M.D.   On: 10/12/2022 21:19           LOS: 0 days   Time spent= 35 mins     Joline Maxcy, MD [facility]  If 7PM-7AM, please contact night-coverage  10/13/2022, 8:08 AM

## 2022-10-13 NOTE — Progress Notes (Signed)
  Echocardiogram 2D Echocardiogram has been performed.  Delcie Roch 10/13/2022, 3:02 PM

## 2022-10-13 NOTE — ED Notes (Signed)
Potassium paused patient to CT

## 2022-10-13 NOTE — Evaluation (Signed)
Physical Therapy Evaluation Patient Details Name: Santia Hartell MRN: [id number] DOB: 07-Jun-1940 Today's Date: 10/13/2022  History of Present Illness  Pt is an 82 y/o female admitted 8/4 for hypokalemia and possible punctate infarct of R internal capsule. PMH: HTN, DM, HLD, gout.  Clinical Impression  Pt admitted with above diagnosis. PTA Mod I living alone, ambulatory with SPC. Currently requires Mod assist for bed mobility, Max assist for sit to stand transfers with [facility]. Demonstrates significant pushing towards left side. Able to improve to min guard level at EOB following some balance training. Bed placed in chair position to improve equilibrium. Will need +2 assist for OOB to recliner, RN notified. Son's present and very supportive. Patient will benefit from continued inpatient follow up therapy, <3 hours/day. Currently with functional limitations due to the deficits listed below (see PT Problem List). She will benefit from acute skilled PT to increase their independence and safety with mobility to allow for safe discharge when medically ready.           If plan is discharge home, recommend the following: Two people to help with walking and/or transfers;Two people to help with bathing/dressing/bathroom;Assistance with cooking/housework;Direct supervision/assist for medications management;Direct supervision/assist for financial management;Assist for transportation;Help with stairs or ramp for entrance   Can travel by private vehicle   No    Equipment Recommendations None recommended by PT  Recommendations for Other Services       Functional Status Assessment Patient has had a recent decline in their functional status and demonstrates the ability to make significant improvements in function in a reasonable and predictable amount of time.     Precautions / Restrictions Precautions Precautions: Fall Precaution Comments: pushing towards Lt Restrictions Weight Bearing Restrictions:  No      Mobility  Bed Mobility Overal bed mobility: Needs Assistance Bed Mobility: Supine to Sit, Sit to Supine     Supine to sit: HOB elevated, Mod assist Sit to supine: Mod assist   General bed mobility comments: Mod assist with max VC for sequencing to EOB, assisted with trunk and LEs for transitions.    Transfers Overall transfer level: Needs assistance Equipment used: Ambulation equipment used Transfers: Sit to/from Stand Sit to Stand: Max assist, From elevated surface           General transfer comment: Max A for boost to stand; demonstrates heavy pushing towards left side. Once upright on Stedy pt able to sit with min guard-min assist for midline control. Worked on weight shifting and pulling feet back (with great difficulty.) Near total assist to rise from Stedy Paddles due to inability to symmetrically bear weight, pushing hard through RLE and lifting LLE into the air.    Ambulation/Gait               General Gait Details: uable at this time  Careers information officer     Tilt Bed    Modified Rankin (Stroke Patients Only)       Balance Overall balance assessment: Needs assistance Sitting-balance support: Feet supported, Single extremity supported Sitting balance-Leahy Scale: Poor Sitting balance - Comments: Min A progressed to min guard. Weight shifting towards left and right, reaching with UEs. Initially with heavy push towards Rt side, but improved with balance training. Postural control: Left lateral lean Standing balance support: Bilateral upper extremity supported, Reliant on assistive device for balance Standing balance-Leahy Scale: Zero  Pertinent Vitals/Pain Pain Assessment Pain Assessment: Faces Faces Pain Scale: Hurts little more Pain Location: knees and shoulders Pain Descriptors / Indicators: Sore, Guarding Pain Intervention(s): Monitored during session, Repositioned,  Limited activity within patient's tolerance    Home Living Family/patient expects to be discharged to:: Private residence Living Arrangements: Alone Available Help at Discharge: Family;Available PRN/intermittently Type of Home: House Home Access: Stairs to enter Entrance Stairs-Rails: Doctor, general practice of Steps: 3   Home Layout: One level Home Equipment: Cane - single Librarian, academic (2 wheels);BSC/3in1      Prior Function Prior Level of Function : Independent/Modified Independent;Driving;History of Falls (last six months)             Mobility Comments: reports primarily using cane but due to weakness, began using RW 1-2 weeks ago. reports hx of falls ADLs Comments: MOD I for ADLs, goes to son's house for showers a couple of times a week due to them having a walk in shower. reports grocery shopping, managing simple meals. family sometimes brings meals     Hand Dominance   Dominant Hand: Right    Extremity/Trunk Assessment   Upper Extremity Assessment Upper Extremity Assessment: Defer to OT evaluation    Lower Extremity Assessment Lower Extremity Assessment: Generalized weakness;Difficult to assess due to impaired cognition    Cervical / Trunk Assessment Cervical / Trunk Assessment: Normal  Communication   Communication: No difficulties  Cognition Arousal/Alertness: Awake/alert Behavior During Therapy: WFL for tasks assessed/performed, Anxious Overall Cognitive Status: Impaired/Different from baseline Area of Impairment: Attention, Following commands, Safety/judgement, Awareness, Problem solving                   Current Attention Level: Selective   Following Commands: Follows one step commands with increased time Safety/Judgement: Decreased awareness of safety, Decreased awareness of deficits Awareness: Emergent Problem Solving: Slow processing, Difficulty sequencing, Requires verbal cues, Requires tactile cues General Comments:  A&Ox4 with extra time. Anxious with mobility with a fear of falling - limiting command following at times and noted resistance to physical assist to move at times.        General Comments General comments (skin integrity, edema, etc.): Son's present, very supportive.    Exercises     Assessment/Plan    PT Assessment Patient needs continued PT services  PT Problem List Decreased strength;Decreased range of motion;Decreased activity tolerance;Decreased balance;Decreased mobility;Decreased coordination;Decreased cognition;Decreased knowledge of use of DME;Pain       PT Treatment Interventions DME instruction;Gait training;Functional mobility training;Therapeutic activities;Therapeutic exercise;Balance training;Neuromuscular re-education;Cognitive remediation;Patient/family education    PT Goals (Current goals can be found in the Care Plan section)  Acute Rehab PT Goals Patient Stated Goal: Get well PT Goal Formulation: With patient/family Time For Goal Achievement: 10/27/22 Potential to Achieve Goals: Good    Frequency Min 1X/week     Co-evaluation               AM-PAC PT "6 Clicks" Mobility  Outcome Measure Help needed turning from your back to your side while in a flat bed without using bedrails?: A Lot Help needed moving from lying on your back to sitting on the side of a flat bed without using bedrails?: A Lot Help needed moving to and from a bed to a chair (including a wheelchair)?: Total Help needed standing up from a chair using your arms (e.g., wheelchair or bedside chair)?: Total Help needed to walk in hospital room?: Total Help needed climbing 3-5 steps with a railing? : Total 6 Click Score:  8    End of Session   Activity Tolerance: Other (comment) (anxious with mobility) Patient left: in bed;with call bell/phone within reach;with bed alarm set;with family/visitor present Nurse Communication: Mobility status;Need for lift equipment PT Visit Diagnosis:  Unsteadiness on feet (R26.81);Repeated falls (R29.6);Muscle weakness (generalized) (M62.81);History of falling (Z91.81);Difficulty in walking, not elsewhere classified (R26.2);Other symptoms and signs involving the nervous system (R29.898);Pain Pain - Right/Left:  (bil) Pain - part of body: Knee;Shoulder    Time: 1001-1025 PT Time Calculation (min) (ACUTE ONLY): 24 min   Charges:   PT Evaluation $PT Eval Moderate Complexity: 1 Mod PT Treatments $Therapeutic Activity: 8-22 mins           Kathlyn Sacramento, PT, DPT [facility]  Rehabilitation Services Physical Therapist Office: [phone] Website: [email or web address]   Berton Mount 10/13/2022, 10:47 AM

## 2022-10-13 NOTE — Progress Notes (Addendum)
STROKE TEAM PROGRESS NOTE   INTERVAL HISTORY Her family is at the bedside.  Patient in bed NAD. Worked with PT but was only able to sit up on the side of the bed. K replacement hanging.   Vitals:   10/13/22 0149 10/13/22 0200 10/13/22 0350 10/13/22 0741  BP: (!) 130/94  (!) 135/93 (!) 121/48  Pulse: 61  60 (!) 59  Resp: 18  18 17   Temp: 98.1 F (36.7 C)  98 F (36.7 C) 98.8 F (37.1 C)  TempSrc: Oral  Oral Oral  SpO2: 96%  96% 94%  Weight:  70.3 kg    Height:  5\' 5"  (1.651 m)     CBC:  Recent Labs  Lab 10/12/22 2013  WBC 8.7  NEUTROABS 6.2  HGB 11.7*  HCT 36.2  MCV 89.6  PLT 167   Basic Metabolic Panel:  Recent Labs  Lab 10/12/22 2013 10/13/22 0738  NA 138 138  K 2.3* 3.2*  CL 104 108  CO2 22 21*  GLUCOSE 112* 84  BUN 13 11  CREATININE 0.76 0.92  CALCIUM 11.3* 10.3  MG 1.2* 1.9   Lipid Panel:  Recent Labs  Lab 10/13/22 0738  CHOL 121  TRIG 74  HDL 44  CHOLHDL 2.8  VLDL 15  LDLCALC 62   HgbA1c:  Recent Labs  Lab 10/13/22 0030  HGBA1C 6.0*   IMAGING past 24 hours CT ANGIO HEAD NECK W WO CM  Result Date: 10/13/2022 CLINICAL DATA:  Initial evaluation for neuro deficit, stroke. EXAM: CT ANGIOGRAPHY HEAD AND NECK WITH AND WITHOUT CONTRAST TECHNIQUE: Multidetector CT imaging of the head and neck was performed using the standard protocol during bolus administration of intravenous contrast. Multiplanar CT image reconstructions and MIPs were obtained to evaluate the vascular anatomy. Carotid stenosis measurements (when applicable) are obtained utilizing NASCET criteria, using the distal internal carotid diameter as the denominator. RADIATION DOSE REDUCTION: This exam was performed according to the departmental dose-optimization program which includes automated exposure control, adjustment of the mA and/or kV according to patient size and/or use of iterative reconstruction technique. CONTRAST:  75mL OMNIPAQUE IOHEXOL 350 MG/ML SOLN COMPARISON:  Prior CT and MRI  from 10/12/2022. FINDINGS: CTA NECK FINDINGS Aortic arch: Partially visualized aortic arch within normal limits for caliber. Bovine branching pattern noted. Moderate atheromatous change about the arch and origin great vessels. No hemodynamically significant stenosis. Right carotid system: Right common and internal carotid arteries are tortuous but patent without dissection. Bulky calcified plaque about the right carotid bulb with associated stenosis of up to 50% by NASCET criteria. Left carotid system: Left common and internal carotid arteries are tortuous but patent without dissection. Bulky calcified plaque about the left carotid bulb without hemodynamically significant greater than 50% stenosis. Vertebral arteries: Both vertebral arteries arise from subclavian arteries. No significant proximal subclavian artery stenosis. Atheromatous plaque at the origins of both vertebral arteries with associated moderate stenoses bilaterally. Vertebral arteries otherwise patent without stenosis or dissection. Skeleton: No discrete or worrisome osseous lesions. Moderately advanced cervical spondylosis. Other neck: No other acute finding. Few scattered thyroid nodules, largest of which measures 1.5 cm on the left (series 7, image 37). Upper chest: Suspected emphysema. Superimposed scattered interlobular septal thickening consistent with pulmonary interstitial congestion. Review of the MIP images confirms the above findings CTA HEAD FINDINGS Anterior circulation: Scattered atheromatous change about the carotid siphons without hemodynamically significant stenosis. A1 segments patent bilaterally. Normal anterior knee artery complex. Anterior cerebral arteries patent without stenosis. No M1 stenosis  or occlusion. No proximal SCA branch occlusion or high-grade stenosis. Distal MCA branches perfused and symmetric. Posterior circulation: Both V4 segments patent without significant stenosis. Both PICA patent. Basilar widely patent  without stenosis. Superior cerebellar and posterior cerebral arteries patent bilaterally without stenosis. Venous sinuses: Patent allowing for timing the contrast bolus. Anatomic variants: None significant.  No aneurysm. Review of the MIP images confirms the above findings IMPRESSION: 1. Negative CTA for large vessel occlusion or other emergent finding. 2. Bulky calcified plaque about the right carotid bulb with associated stenosis of up to 50% by NASCET criteria. 3. Atheromatous plaque at the origins of both vertebral arteries with associated moderate stenoses bilaterally. 4. Diffuse tortuosity of the major arterial vasculature of the head and neck, suggesting chronic underlying hypertension. 5. Mild pulmonary interstitial edema/congestion. 6. Few scattered thyroid nodules, largest of which measures 1.5 cm on the left. Further evaluation with dedicated thyroid ultrasound recommended. This could be performed on a nonemergent outpatient basis. (Ref: J Am Coll Radiol. 2015 Feb;12(2): 143-50). Aortic Atherosclerosis (ICD10-I70.0) and Emphysema (ICD10-J43.9). Electronically Signed   By: Rise Mu M.D.   On: 10/13/2022 02:26   DG Chest Portable 1 View  Result Date: 10/12/2022 CLINICAL DATA:  Speech change, worsening fatigue, decreased mobility, and cough. EXAM: PORTABLE CHEST 1 VIEW COMPARISON:  06/21/2020. FINDINGS: The heart is enlarged and the mediastinal contour is within normal limits. There is atherosclerotic calcification of the aorta. The pulmonary vasculature is distended. There is blunting of the left costophrenic angle, possible small pleural effusion with atelectasis or infiltrate. The right lung is clear. No pneumothorax. Shoulder arthroplasty changes are noted bilaterally. IMPRESSION: 1. Cardiomegaly with mild pulmonary vascular congestion. 2. Small left pleural effusion with atelectasis or infiltrate. Electronically Signed   By: Thornell Sartorius M.D.   On: 10/12/2022 22:24   MR BRAIN WO  CONTRAST  Result Date: 10/12/2022 CLINICAL DATA:  Initial evaluation for neuro deficit, stroke suspected. EXAM: MRI HEAD WITHOUT CONTRAST TECHNIQUE: Multiplanar, multiecho pulse sequences of the brain and surrounding structures were obtained without intravenous contrast. COMPARISON:  Prior CT from earlier the same day. FINDINGS: Brain: Cerebral volume within normal limits for age. Patchy T2/FLAIR hyperintensity involving the periventricular deep white matter both cerebral hemispheres, consistent with chronic small vessel ischemic disease, mild to moderate in nature. Patchy involvement of the pons noted. Subtle punctate focus of diffusion signal abnormality seen involving the posterior limb of the right internal capsule (series 5, image 75). Associated signal loss on ADC map (series 6, image 25). Findings suspicious for a tiny acute ischemic infarct, although this could potentially be artifactual given small size. No associated hemorrhage. No other evidence for acute or subacute ischemia. Gray-white matter disease a shin otherwise maintained. No areas of chronic cortical infarction. Focus of chronic hemosiderin staining present at the right occipital lobe, consistent with prior hemorrhage at this location (series 12, image 31). No acute blood seen at this location on prior head CT. No other chronic intracranial blood products. No mass lesion, midline shift or mass effect. No hydrocephalus or extra-axial fluid collection. Pituitary gland and suprasellar region within normal limits. Vascular: Major intracranial vascular flow voids are maintained. Skull and upper cervical spine: Craniocervical junction within normal limits. Bone marrow signal intensity normal. No scalp soft tissue abnormality. Sinuses/Orbits: Prior bilateral ocular lens replacement. Paranasal sinuses are clear. No significant mastoid effusion. Other: None. IMPRESSION: 1. Subtle punctate focus of diffusion signal abnormality involving the posterior limb  of the right internal capsule. While this could potentially  be artifactual given small size, a possible tiny acute ischemic infarct is difficult to exclude, and could be considered in the correct clinical setting. 2. No other acute intracranial abnormality. 3. Chronic hemosiderin staining at the right occipital lobe, consistent with prior hemorrhage at this location. 4. Underlying mild to moderate chronic microvascular ischemic disease. Electronically Signed   By: Rise Mu M.D.   On: 10/12/2022 22:21   CT HEAD WO CONTRAST ( )  Result Date: 10/12/2022 CLINICAL DATA:  Neuro deficit, acute, stroke suspected. Slurred speech, right-sided weakness EXAM: CT HEAD WITHOUT CONTRAST TECHNIQUE: Contiguous axial images were obtained from the base of the skull through the vertex without intravenous contrast. RADIATION DOSE REDUCTION: This exam was performed according to the departmental dose-optimization program which includes automated exposure control, adjustment of the mA and/or kV according to patient size and/or use of iterative reconstruction technique. COMPARISON:  [facility]24 FINDINGS: Brain: There is atrophy and chronic small vessel disease changes. No acute intracranial abnormality. Specifically, no hemorrhage, hydrocephalus, mass lesion, acute infarction, or significant intracranial injury. Vascular: No hyperdense vessel or unexpected calcification. Skull: No acute calvarial abnormality. Sinuses/Orbits: No acute findings Other: None IMPRESSION: Atrophy, chronic microvascular disease. No acute intracranial abnormality. Electronically Signed   By: Charlett Nose M.D.   On: 10/12/2022 21:19    PHYSICAL EXAM Physical Exam  Constitutional: Appears well-developed and well-nourished.  Psych: Affect appropriate to situation Eyes: Normal external eye and conjunctiva. HENT: Normocephalic, no lesions, without obvious abnormality.   Musculoskeletal-no joint tenderness, deformity or swelling Cardiovascular:  Normal rate and regular rhythm.  Respiratory: Effort normal, non-labored breathing saturations WNL GI: Soft.  No distension. There is no tenderness.  Skin: WDI   Neuro:  Mental Status: Alert, oriented, thought content appropriate.  Speech fluent without evidence of aphasia.  Able to follow commands without difficulty. Cranial Nerves: II:  Visual fields grossly normal,  III,IV, VI: ptosis not present, extra-ocular motions intact bilaterally pupils equal, round, reactive to light and accommodation V,VII: smile symmetric, facial light touch sensation normal bilaterally VIII: hearing normal bilaterally IX,X: uvula rises symmetrically XI: bilateral shoulder shrug XII: midline tongue extension Motor: Right : Upper extremity   4-/5  Left:     Upper extremity   4-/5  Lower extremity   3/5  Lower extremity   3/5 Tone and bulk:decreased tone Sensory: light touch intact throughout, bilaterally Deep Tendon Reflexes: 2+ and symmetric biceps, patella Cerebellar: normal finger-to-nose,  Gait: not tested      ASSESSMENT/PLAN Tina Frank is a 82 y.o. female with PMH significant for DM2, HTN, HLD, Osteoporosis who presents with 1-2 weeks of progressive generalized walker. Having to use cane, walker and now having trouble getting up. Family also noted some slurred speech over the last day and brought her to the hospital.   Workup notable for hypokalemia, MRI brain with a small punctate stroke in the R posterior Internal capsule. She denies prior hx of strokes, no hx of Afib that she is aware of.  Generalized weakness Encephalopathy Could be related to electrolyte imbalance - hypokalemia, hypercalcemia, hypomagnesemia  Could be due to decreased po intake, and dehydration On IVF and diet orders K, Mg and Ca improved today Pt clinically improving  ? Stroke - ? punctate R IC infarct. Incidental finding. Small vessel disease CT head No acute abnormality.    CTA head & neck no LVO  MRI   subtle punctate R IC DWI change, however not convinced on my view 2D Echo pending LDL 62 HgbA1c  6.0 VTE prophylaxis - heparin subq No antithrombotic prior to admission, now on aspirin 81 mg daily.  Therapy recommendations:  SNF  Disposition:  pending  Hypertension Home meds:  clonidine 0.1mg  daily ,hydralazine 25mg  BID Stable Long-term BP goal normotensive  Hyperlipidemia Home meds:  atorvastatin 10 mg daily, resumed in hospital LDL 62, goal < 70 No high intensity statin due to LDL at goal and the stroke in not convincing Continue home dose of atorvastatin at discharge  Other Stroke Risk Factors Advanced Age >/= 17   Other Active Problems Hypomagnesemia: 1.2 replaced >1.9 Hypokalemia:  2.3> replaced 3.2> replacement ordered Hx of hyperparathyroidism Bradycardia - follows with cardiology Dr. Odis Hollingshead Right shoulder surgery in 04/2022  Hospital day # 0  Valentina Lucks, MSN, NP-C [facility] See AMION or use Epic Chat'  ATTENDING NOTE: I reviewed above note and agree with the assessment and plan. Pt was seen and examined.   No family is at the bedside. Pt lying in bed, hard of hearing, but awake alert, orientated to self, age, place, time but not situation. Fluent language, following all simple commands, naming 3/3 and able to repeat. Moving all extremities, no hemianopia or facial droop.   Pt stated that she had no appetite recently and did not eat or drink well, difficulty getting out of recliner, her son came visiting her and sent her to ED. Now she felt better than yesterday.   Pt symptoms likely due to electrolyte imbalance, and dehydration. Currently on IVF. Recommend feeding supplement such as ensure. MRI reported subtle DWI signal change at right IC, but seems not very convincing. Currently on ASA and statin. Continue current treatment especially fluid and nutrition. PT and OT recommend SNF. Will follow.  For detailed assessment and plan, please refer to  above/below as I have made changes wherever appropriate.   Marvel Plan, MD PhD Stroke Neurology 10/13/2022 2:40 PM    To contact Stroke Continuity provider, please refer to [email or web address]. After hours, contact General Neurology

## 2022-10-13 NOTE — Evaluation (Signed)
Occupational Therapy Evaluation Patient Details Name: Tina Frank MRN: [id number] DOB: [birth year] Today's Date: 10/13/2022   History of Present Illness Pt is an 82 y/o female admitted for hypokalemia and possible punctate infarct of R internal capsule. PMH: HTN, DM, HLD, gout   Clinical Impression   PTA, pt lives alone, typically ambulatory with a cane but using RW recently due to weakness. Pt reports typically Modified Independent with ADLs and most IADLs. Pt presents now with deficits in strength, sitting balance and endurance. Pt also very anxious with mobility attempts due to fear of falling which limited OOB ADL assessments today. Overall, pt requires Mod-Max A for bed mobility and Min A for sitting balance. Pt requires Min A for UB ADL and up to Total A for LB ADLs due to deficits. Patient will benefit from continued inpatient follow up therapy, <3 hours/day at DC as pt is significantly below functional baseline.      Recommendations for follow up therapy are one component of a multi-disciplinary discharge planning process, led by the attending physician.  Recommendations may be updated based on patient status, additional functional criteria and insurance authorization.   Assistance Recommended at Discharge Frequent or constant Supervision/Assistance  Patient can return home with the following A lot of help with walking and/or transfers;A lot of help with bathing/dressing/bathroom    Functional Status Assessment  Patient has had a recent decline in their functional status and demonstrates the ability to make significant improvements in function in a reasonable and predictable amount of time.  Equipment Recommendations  BSC/3in1    Recommendations for Other Services       Precautions / Restrictions Precautions Precautions: Fall Restrictions Weight Bearing Restrictions: No      Mobility Bed Mobility Overal bed mobility: Needs Assistance Bed Mobility: Supine to Sit, Sit to  Supine     Supine to sit: Max assist, HOB elevated Sit to supine: Mod assist   General bed mobility comments: Assist for BLE to EOB, cued for either UE to assist on bedrail- with attempts to lift trunk x 3, pt pushing back each time despite cues to lean forward. ultimately able to sit EOB 5 min. Able to lay trunk down with assist to get BLE back into bed    Transfers                   General transfer comment: pt declined/fearful of falling      Balance Overall balance assessment: Needs assistance Sitting-balance support: No upper extremity supported, Feet supported Sitting balance-Leahy Scale: Poor Sitting balance - Comments: Min A with posterior bias at times Postural control: Posterior lean                                 ADL either performed or assessed with clinical judgement   ADL Overall ADL's : Needs assistance/impaired Eating/Feeding: Set up;Sitting Eating/Feeding Details (indicate cue type and reason): drinking juice sitting EOB Grooming: Min guard;Sitting   Upper Body Bathing: Minimal assistance;Sitting   Lower Body Bathing: Maximal assistance;Sitting/lateral leans;Bed level   Upper Body Dressing : Minimal assistance;Sitting   Lower Body Dressing: Maximal assistance;Sitting/lateral leans;Bed level       Toileting- Clothing Manipulation and Hygiene: Total assistance;Bed level;Sitting/lateral lean         General ADL Comments: Limited by fear of falling primarily which limited OOB ADL participation.     Vision Ability to See in Adequate Light: 0 Adequate  Patient Visual Report: No change from baseline Vision Assessment?: No apparent visual deficits     Perception     Praxis      Pertinent Vitals/Pain Pain Assessment Pain Assessment: Faces Faces Pain Scale: Hurts little more Pain Location: knees? chronic pain, generalized with movement Pain Descriptors / Indicators: Sore, Guarding Pain Intervention(s): Monitored during  session, Other (comment) (pt reports taking tylenol at baseline- RN aware)     Hand Dominance Right   Extremity/Trunk Assessment Upper Extremity Assessment Upper Extremity Assessment: Generalized weakness (hx of spacer in L shoulder and replacement R shoulder)   Lower Extremity Assessment Lower Extremity Assessment: Defer to PT evaluation   Cervical / Trunk Assessment Cervical / Trunk Assessment: Normal   Communication Communication Communication: No difficulties   Cognition Arousal/Alertness: Awake/alert Behavior During Therapy: WFL for tasks assessed/performed, Anxious Overall Cognitive Status: Impaired/Different from baseline Area of Impairment: Attention, Following commands, Safety/judgement, Awareness, Problem solving                   Current Attention Level: Selective   Following Commands: Follows one step commands with increased time Safety/Judgement: Decreased awareness of safety, Decreased awareness of deficits Awareness: Emergent Problem Solving: Slow processing, Difficulty sequencing, Requires verbal cues, Requires tactile cues General Comments: A&Ox4, very anxious with mobility with a fear of falling - limiting command following at times and noted resistance to physical assist to move at times. did ask appropriate questions if insurance would cover rehab if this is a new acute issue, etc but then asking about OP therapy     General Comments       Exercises     Shoulder Instructions      Home Living Family/patient expects to be discharged to:: Private residence Living Arrangements: Alone Available Help at Discharge: Family;Available PRN/intermittently Type of Home: House Home Access: Stairs to enter [employer] of Steps: 3 Entrance Stairs-Rails: Right;Left Home Layout: One level     Bathroom Shower/Tub: Chief Strategy Officer: Standard     Home Equipment: Cane - single Librarian, academic (2 wheels);BSC/3in1           Prior Functioning/Environment Prior Level of Function : Independent/Modified Independent;Driving;History of Falls (last six months)             Mobility Comments: reports primarily using cane but due to weakness, began using RW 1-2 weeks ago. reports hx of falls ADLs Comments: MOD I for ADLs, goes to son's house for showers a couple of times a week due to them having a walk in shower. reports grocery shopping, managing simple meals. family sometimes brings meals        OT Problem List: Decreased strength;Decreased activity tolerance;Impaired balance (sitting and/or standing);Decreased cognition;Decreased safety awareness      OT Treatment/Interventions: Self-care/ADL training;Therapeutic exercise;Energy conservation;DME and/or AE instruction;Therapeutic activities;Patient/family education    OT Goals(Current goals can be found in the care plan section) Acute Rehab OT Goals Patient Stated Goal: avoid falls OT Goal Formulation: With patient Time For Goal Achievement: 10/27/22 Potential to Achieve Goals: Good ADL Goals Pt Will Perform Lower Body Bathing: with min assist;sit to/from stand Pt Will Perform Lower Body Dressing: with min assist;sit to/from stand Pt Will Transfer to Toilet: with min assist;stand pivot transfer;bedside commode  OT Frequency: Min 2X/week    Co-evaluation              AM-PAC OT "6 Clicks" Daily Activity     Outcome Measure Help from another person eating meals?: A Little  Help from another person taking care of personal grooming?: A Little Help from another person toileting, which includes using toliet, bedpan, or urinal?: Total Help from another person bathing (including washing, rinsing, drying)?: A Lot Help from another person to put on and taking off regular upper body clothing?: A Little Help from another person to put on and taking off regular lower body clothing?: A Lot 6 Click Score: 14   End of Session Nurse Communication: Mobility  status  Activity Tolerance: Other (comment) (limited by anxiety) Patient left: in bed;with call bell/phone within reach;with bed alarm set  OT Visit Diagnosis: Unsteadiness on feet (R26.81);Other abnormalities of gait and mobility (R26.89);Muscle weakness (generalized) (M62.81)                Time: 4098-1191 OT Time Calculation (min): 28 min Charges:  OT General Charges $OT Visit: 1 Visit OT Evaluation $OT Eval Moderate Complexity: 1 Mod OT Treatments $Self Care/Home Management : 8-22 mins  Bradd Canary, OTR/L Acute Rehab Services Office: [phone]   Lorre Munroe 10/13/2022, 8:19 AM

## 2022-10-13 NOTE — TOC Initial Note (Signed)
Transition of Care [facility]) - Initial/Assessment Note    Patient Details  Name: Tina Frank MRN: [id number] Date of Birth: 1941/01/23  [facility]) CM/SW Contact:    Carmina Miller, LCSWA Phone Number: 10/13/2022, 1:04 PM  Clinical Narrative:                 CSW spoke with pt at bedside about PT recs, pt seemed confused but was pleasant. CSW spoke with pt's son Jeannett Senior by phone, he states pt was last at [facility] five months ago for rehab. Jeannett Senior states pt was accepted to [facility] (ALF bed), deposit has been paid, first payment to draft in September, requested to see if a STR bed was available there but if not he would like pt to return to [facility].          Patient Goals and CMS Choice            Expected Discharge Plan and Services                                              Prior Living Arrangements/Services                       Activities of Daily Living Home Assistive Devices/Equipment: Dan Humphreys (specify type) ADL Screening (condition at time of admission) Patient's cognitive ability adequate to safely complete daily activities?: Yes Is the patient deaf or have difficulty hearing?: Yes Does the patient have difficulty seeing, even when wearing glasses/contacts?: Yes Does the patient have difficulty concentrating, remembering, or making decisions?: Yes Patient able to express need for assistance with ADLs?: Yes Does the patient have difficulty dressing or bathing?: Yes Independently performs ADLs?: No Communication: Independent Dressing (OT): Independent Is this a change from baseline?: Change from baseline, expected to last >3 days Grooming: Needs assistance Is this a change from baseline?: Change from baseline, expected to last >3 days Feeding: Needs assistance Is this a change from baseline?: Change from baseline, expected to last >3 days Bathing: Needs assistance Is this a change from baseline?: Change from  baseline, expected to last >3 days Toileting: Needs assistance Is this a change from baseline?: Change from baseline, expected to last >3days In/Out Bed: Needs assistance Is this a change from baseline?: Change from baseline, expected to last >3 days Walks in Home: Needs assistance Is this a change from baseline?: Change from baseline, expected to last >3 days Does the patient have difficulty walking or climbing stairs?: Yes Weakness of Legs: Both Weakness of Arms/Hands: None  Permission Sought/Granted                  Emotional Assessment              Admission diagnosis:  Hypercalcemia [E83.52] Hypokalemia [E87.6] Hypomagnesemia [E83.42] Acute CVA (cerebrovascular accident) (HCC) [I63.9] Cerebrovascular accident (CVA), unspecified mechanism (HCC) [I63.9] Patient Active Problem List   Diagnosis Date Noted   Acute CVA (cerebrovascular accident) (HCC) 10/12/2022   Hypercalcemia 10/12/2022   Hypomagnesemia 10/12/2022   Positive anti-CCP test 10/12/2022   Sinus bradycardia 10/12/2022   DNR (do not resuscitate)/DNI(Do Not Intubate) 10/12/2022   Arthritis of left ankle 08/15/2020   Benign neoplasm of adrenal gland 08/15/2020   Cyst of kidney, acquired 08/15/2020   Gastroesophageal reflux disease 08/15/2020   Hardening of the aorta (main artery of  the heart) (HCC) 08/15/2020   History of primary hyperparathyroidism 08/15/2020   Gout 08/15/2020   Joint pain 08/15/2020   Oral phase dysphagia 08/15/2020   Osteoporosis 08/15/2020   Prediabetes 08/15/2020   Presence of left artificial shoulder joint 08/15/2020   Pure hypercholesterolemia 08/15/2020   Visual disturbance 08/15/2020   Vitamin D deficiency 08/15/2020   Voice hoarseness 08/15/2020   Chronic arthropathy 04/25/2020   Decubitus skin ulcer 04/12/2019   Hypokalemia 03/15/2019   Prosthetic joint infection (HCC) 02/09/2019   Degenerative joint disease of left shoulder 02/14/2015   Pre-syncope 10/06/2013    Leukocytosis 10/06/2013   Hypotension 10/06/2013   Osteoarthritis 07/27/2013   Essential hypertension 10/28/2011   DM (diabetes mellitus) (HCC) 10/28/2011   Hyperlipidemia 10/28/2011   Reflux esophagitis    Herpes zoster    Shingles    PCP:  Sigmund Hazel, MD Pharmacy:   [facility] [phone] Gin[place], [place] - [address] AT [facility] [address] & [facility] [address] [place] [address] Phone: [phone] Fax: [phone]     Social Determinants of Health (SDOH) Social History: SDOH Screenings   Food Insecurity: No Food Insecurity (10/13/2022)  Housing: Low Risk  (10/13/2022)  Transportation Needs: No Transportation Needs (10/13/2022)  Utilities: Not At Risk (10/13/2022)  Depression (PHQ2-9): Low Risk  (07/19/2019)  Tobacco Use: Medium Risk (10/12/2022)   SDOH Interventions:     Readmission Risk Interventions    04/28/2022    3:15 PM  Readmission Risk Prevention Plan  Transportation Screening Complete  PCP or Specialist Appt within 5-7 Days Complete  Home Care Screening Complete  Medication Review (RN CM) Complete

## 2022-10-13 NOTE — NC FL2 (Signed)
[place] MEDICAID FL2 LEVEL OF CARE FORM     IDENTIFICATION  Patient Name: Tina Frank Birthdate: [birth year] Sex: female Admission Date (Current Location): 10/12/2022  [facility] and [place] Number:  Producer, television/film/video and Address:  [facility], [address] [place], [place] [address]      Provider Number: [id number]  Attending Physician Name and Address:  Dimple Nanas, MD  Relative Name and Phone Number:  Jhenna Enquist [phone]    Current Level of Care: Hospital Recommended Level of Care: Skilled Nursing Facility Prior Approval Number:    Date Approved/Denied:   PASRR Number: [id number]  Discharge Plan: SNF    Current Diagnoses: Patient Active Problem List   Diagnosis Date Noted   Acute CVA (cerebrovascular accident) (HCC) 10/12/2022   Hypercalcemia 10/12/2022   Hypomagnesemia 10/12/2022   Positive anti-CCP test 10/12/2022   Sinus bradycardia 10/12/2022   DNR (do not resuscitate)/DNI(Do Not Intubate) 10/12/2022   Arthritis of left ankle 08/15/2020   Benign neoplasm of adrenal gland 08/15/2020   Cyst of kidney, acquired 08/15/2020   Gastroesophageal reflux disease 08/15/2020   Hardening of the aorta (main artery of the heart) (HCC) 08/15/2020   History of primary hyperparathyroidism 08/15/2020   Gout 08/15/2020   Joint pain 08/15/2020   Oral phase dysphagia 08/15/2020   Osteoporosis 08/15/2020   Prediabetes 08/15/2020   Presence of left artificial shoulder joint 08/15/2020   Pure hypercholesterolemia 08/15/2020   Visual disturbance 08/15/2020   Vitamin D deficiency 08/15/2020   Voice hoarseness 08/15/2020   Chronic arthropathy 04/25/2020   Decubitus skin ulcer 04/12/2019   Hypokalemia 03/15/2019   Prosthetic joint infection (HCC) 02/09/2019   Degenerative joint disease of left shoulder 02/14/2015   Pre-syncope 10/06/2013   Leukocytosis 10/06/2013   Hypotension 10/06/2013   Osteoarthritis 07/27/2013   Essential  hypertension 10/28/2011   DM (diabetes mellitus) (HCC) 10/28/2011   Hyperlipidemia 10/28/2011   Reflux esophagitis    Herpes zoster    Shingles     Orientation RESPIRATION BLADDER Height & Weight     Self, Time, Situation, Place  Normal Continent Weight: 155 lb (70.3 kg) Height:  5\' 5"  (165.1 cm)  BEHAVIORAL SYMPTOMS/MOOD NEUROLOGICAL BOWEL NUTRITION STATUS      Continent Diet (see dc summary)  AMBULATORY STATUS COMMUNICATION OF NEEDS Skin   Extensive Assist Verbally Normal                       Personal Care Assistance Level of Assistance  Bathing, Feeding, Dressing Bathing Assistance: Limited assistance Feeding assistance: Independent Dressing Assistance: Limited assistance     Functional Limitations Info  Sight, Hearing, Speech Sight Info: Adequate Hearing Info: Adequate Speech Info: Adequate    SPECIAL CARE FACTORS FREQUENCY  PT (By licensed PT), OT (By licensed OT)     PT Frequency: 5x week OT Frequency: 5x week            Contractures Contractures Info: Not present    Additional Factors Info  Code Status, Allergies Code Status Info: DNR Allergies Info: Abaloparatide  Mobic (Meloxicam)  Crestor (Rosuvastatin)  Norvasc (Amlodipine Besylate)  Simvastatin  Sulfa Antibiotics  Zetia (Ezetimibe)           Current Medications (10/13/2022):  This is the current hospital active medication list Current Facility-Administered Medications  Medication Dose Route Frequency Provider Last Rate Last Admin   0.9 %  sodium chloride infusion   Intravenous Continuous Amin, Ankit Chirag, MD 75 mL/hr  at 10/13/22 1057 New Bag at 10/13/22 1057   acetaminophen (TYLENOL) tablet 650 mg  650 mg Oral Q4H PRN Carollee Herter, DO   650 mg at 10/13/22 1103   Or   acetaminophen (TYLENOL) 160 MG/5ML solution 650 mg  650 mg Per Tube Q4H PRN Carollee Herter, DO       Or   acetaminophen (TYLENOL) suppository 650 mg  650 mg Rectal Q4H PRN Carollee Herter, DO       allopurinol (ZYLOPRIM) tablet 300 mg   300 mg Oral Daily Carollee Herter, DO   300 mg at 10/13/22 1103   aspirin EC tablet 81 mg  81 mg Oral Daily Carollee Herter, DO   81 mg at 10/13/22 1102   atorvastatin (LIPITOR) tablet 10 mg  10 mg Oral QHS Carollee Herter, DO       cefTRIAXone (ROCEPHIN) 1 g in sodium chloride 0.9 % 100 mL IVPB  1 g Intravenous Q24H Amin, Ankit Chirag, MD       heparin injection 5,000 Units  5,000 Units Subcutaneous Q8H Carollee Herter, DO   5,000 Units at 10/13/22 1610   ipratropium-albuterol (DUONEB) 0.5-2.5 (3) MG/3ML nebulizer solution 3 mL  3 mL Nebulization Q4H PRN Amin, Ankit Chirag, MD       labetalol (NORMODYNE) injection 10 mg  10 mg Intravenous Q2H PRN Amin, Ankit Chirag, MD       ondansetron (ZOFRAN) injection 4 mg  4 mg Intravenous Q6H PRN Carollee Herter, DO       pantoprazole (PROTONIX) EC tablet 40 mg  40 mg Oral BID WC Carollee Herter, DO   40 mg at 10/13/22 1102   potassium chloride 10 mEq in 100 mL IVPB  10 mEq Intravenous Q1 Hr x 5 Amin, Ankit Chirag, MD 100 mL/hr at 10/13/22 1241 10 mEq at 10/13/22 1241   potassium chloride SA (KLOR-CON M) CR tablet 20 mEq  20 mEq Oral Q4H Amin, Ankit Chirag, MD   20 mEq at 10/13/22 1102   senna-docusate (Senokot-S) tablet 1 tablet  1 tablet Oral QHS PRN Amin, Ankit Chirag, MD       sodium phosphate 30 mmol in dextrose 5 % 250 mL infusion  30 mmol Intravenous Once Amin, Ankit Chirag, MD       traZODone (DESYREL) tablet 50 mg  50 mg Oral QHS PRN Amin, Loura Halt, MD         Discharge Medications: Please see discharge summary for a list of discharge medications.  Relevant Imaging Results:  Relevant Lab Results:   Additional Information     B , LCSWA

## 2022-10-14 ENCOUNTER — Inpatient Hospital Stay (HOSPITAL_COMMUNITY): Payer: Medicare PPO

## 2022-10-14 DIAGNOSIS — G9341 Metabolic encephalopathy: Secondary | ICD-10-CM

## 2022-10-14 DIAGNOSIS — R410 Disorientation, unspecified: Secondary | ICD-10-CM | POA: Diagnosis not present

## 2022-10-14 DIAGNOSIS — I639 Cerebral infarction, unspecified: Secondary | ICD-10-CM | POA: Diagnosis not present

## 2022-10-14 LAB — BRAIN NATRIURETIC PEPTIDE: B Natriuretic Peptide: 950.8 pg/mL — ABNORMAL HIGH (ref 0.0–100.0)

## 2022-10-14 MED ORDER — FUROSEMIDE 10 MG/ML IJ SOLN
40.0000 mg | Freq: Once | INTRAMUSCULAR | Status: AC
  Administered 2022-10-14: 40 mg via INTRAVENOUS
  Filled 2022-10-14: qty 4

## 2022-10-14 MED ORDER — MAGNESIUM OXIDE -MG SUPPLEMENT 400 (240 MG) MG PO TABS
800.0000 mg | ORAL_TABLET | Freq: Two times a day (BID) | ORAL | Status: AC
  Administered 2022-10-14 (×2): 800 mg via ORAL
  Filled 2022-10-14 (×2): qty 2

## 2022-10-14 MED ORDER — ENSURE ENLIVE PO LIQD
237.0000 mL | Freq: Two times a day (BID) | ORAL | Status: DC
  Administered 2022-10-14 – 2022-10-17 (×7): 237 mL via ORAL

## 2022-10-14 MED ORDER — HYDRALAZINE HCL 25 MG PO TABS
25.0000 mg | ORAL_TABLET | Freq: Two times a day (BID) | ORAL | Status: DC
  Administered 2022-10-14 – 2022-10-17 (×7): 25 mg via ORAL
  Filled 2022-10-14 (×8): qty 1

## 2022-10-14 MED ORDER — CARVEDILOL 12.5 MG PO TABS
12.5000 mg | ORAL_TABLET | Freq: Two times a day (BID) | ORAL | Status: DC
  Administered 2022-10-14 – 2022-10-17 (×8): 12.5 mg via ORAL
  Filled 2022-10-14 (×8): qty 1

## 2022-10-14 NOTE — Progress Notes (Addendum)
STROKE TEAM PROGRESS NOTE   INTERVAL HISTORY Her son is at the bedside.   Today's assessment: no focal deficits. Patient in bed visibly agitated and fidgeting.  New 02 requirement of 2 Liters via [place]. Patient able to follow some commands and answer some questions. Son stated that patient has been confused. She was saying her DIL was at the hospital (she isn't), spoke about seeing a dog (she has not owned a dog in years and it is not at the hospital), confuses her sons (admittedly is not out of the ordinary).  Vitals:   10/14/22 0351 10/14/22 0400 10/14/22 0800 10/14/22 0909  BP: (!) 178/79   (!) 157/100  Pulse: 69   69  Resp: 16   16  Temp: 98.9 F (37.2 C)   99.5 F (37.5 C)  TempSrc: Oral   Oral  SpO2: (!) 86% 93% 94% 92%  Weight:      Height:       CBC:  Recent Labs  Lab 10/12/22 2013 10/14/22 0248  WBC 8.7 12.1*  NEUTROABS 6.2  --   HGB 11.7* 11.3*  HCT 36.2 34.9*  MCV 89.6 90.6  PLT 167 225   Basic Metabolic Panel:  Recent Labs  Lab 10/13/22 0738 10/14/22 0248  NA 138 137  K 3.2* 3.8  CL 108 108  CO2 21* 21*  GLUCOSE 84 116*  BUN 11 9  CREATININE 0.92 0.95  CALCIUM 10.3 9.9  MG 1.9 1.5*  PHOS 2.3* 3.4   Lipid Panel:  Recent Labs  Lab 10/13/22 0738  CHOL 121  TRIG 74  HDL 44  CHOLHDL 2.8  VLDL 15  LDLCALC 62   HgbA1c:  Recent Labs  Lab 10/13/22 0030  HGBA1C 6.0*   IMAGING past 24 hours DG Chest Port 1 View  Result Date: 10/14/2022 CLINICAL DATA:  [id number] Dyspnea [id number] EXAM: PORTABLE CHEST 1 VIEW COMPARISON:  10/12/2022. FINDINGS: There is prominence of bronchovascular markings with superimposed Kerley B-lines, suggesting congestive heart failure/pulmonary edema. Redemonstration of left retrocardiac airspace opacity obscuring the lateral left hemidiaphragm, descending thoracic aorta and blunting the left lateral costophrenic angle suggesting combination of left lower lobe atelectasis and/or consolidation with pleural effusion. Bilateral costophrenic  angles are clear. Stable cardio-mediastinal silhouette. Aortic arch calcifications noted. No acute osseous abnormalities. Bilateral shoulder arthroplasty noted. The soft tissues are within normal limits. IMPRESSION: 1. Findings suggestive of congestive heart failure/pulmonary edema. 2. Left lower lobe atelectasis and/or consolidation with pleural effusion. Electronically Signed   By: Jules Schick M.D.   On: 10/14/2022 08:26   ECHOCARDIOGRAM COMPLETE  Result Date: 10/13/2022    ECHOCARDIOGRAM REPORT   Patient Name:   Tina Frank Date of Exam: 10/13/2022 Medical Rec #:  [id number]       Height:       65.0 in Accession #:    [id number]      Weight:       155.0 lb Date of Birth:  June 16, 1940       BSA:          1.775 m Patient Age:    81 years        BP:           148/52 mmHg Patient Gender: F               HR:           57 bpm. Exam Location:  Inpatient Procedure: 2D Echo, Color Doppler and Cardiac Doppler Indications:    stroke  History:        Patient has prior history of Echocardiogram examinations, most                 recent 10/06/2013. Risk Factors:Diabetes, Hypertension and                 Dyslipidemia.  Sonographer:    Delcie Roch RDCS Referring Phys: [phone] ERIC CHEN IMPRESSIONS  1. Left ventricular ejection fraction, by estimation, is 60 to 65%. The left ventricle has normal function. The left ventricle has no regional wall motion abnormalities. There is mild left ventricular hypertrophy. Left ventricular diastolic parameters are consistent with Grade I diastolic dysfunction (impaired relaxation).  2. Right ventricular systolic function is normal. The right ventricular size is normal. There is normal pulmonary artery systolic pressure. The estimated right ventricular systolic pressure is 33.7 mmHg.  3. Left atrial size was mildly dilated.  4. The mitral valve is normal in structure. Trivial mitral valve regurgitation.  5. The aortic valve is tricuspid. Aortic valve regurgitation is not visualized.  Aortic valve sclerosis/calcification is present, without any evidence of aortic stenosis.  6. The inferior vena cava is normal in size with greater than 50% respiratory variability, suggesting right atrial pressure of 3 mmHg. FINDINGS  Left Ventricle: Left ventricular ejection fraction, by estimation, is 60 to 65%. The left ventricle has normal function. The left ventricle has no regional wall motion abnormalities. The left ventricular internal cavity size was normal in size. There is  mild left ventricular hypertrophy. Left ventricular diastolic parameters are consistent with Grade I diastolic dysfunction (impaired relaxation). Right Ventricle: The right ventricular size is normal. Right ventricular systolic function is normal. There is normal pulmonary artery systolic pressure. The tricuspid regurgitant velocity is 2.77 m/s, and with an assumed right atrial pressure of 3 mmHg,  the estimated right ventricular systolic pressure is 33.7 mmHg. Left Atrium: Left atrial size was mildly dilated. Right Atrium: Right atrial size was normal in size. Pericardium: Trivial pericardial effusion is present. Mitral Valve: The mitral valve is normal in structure. Trivial mitral valve regurgitation. Tricuspid Valve: The tricuspid valve is normal in structure. Tricuspid valve regurgitation is mild. Aortic Valve: The aortic valve is tricuspid. Aortic valve regurgitation is not visualized. Aortic valve sclerosis/calcification is present, without any evidence of aortic stenosis. Pulmonic Valve: Pulmonic valve regurgitation is not visualized. Aorta: The aortic root and ascending aorta are structurally normal, with no evidence of dilitation. Venous: The inferior vena cava is normal in size with greater than 50% respiratory variability, suggesting right atrial pressure of 3 mmHg. IAS/Shunts: No atrial level shunt detected by color flow Doppler.  LEFT VENTRICLE PLAX 2D LVIDd:         4.20 cm   Diastology LVIDs:         2.40 cm   LV e'  medial:    6.42 cm/s LV PW:         1.10 cm   LV E/e' medial:  15.2 LV IVS:        1.10 cm   LV e' lateral:   8.92 cm/s LVOT diam:     1.70 cm   LV E/e' lateral: 10.9 LV SV:         81 LV SV Index:   46 LVOT Area:     2.27 cm  RIGHT VENTRICLE RV Basal diam:  2.60 cm RV S prime:     15.60 cm/s TAPSE (M-mode): 1.9 cm LEFT ATRIUM  Index        RIGHT ATRIUM           Index LA diam:        4.30 cm 2.42 cm/m   RA Area:     10.30 cm LA Vol (A2C):   73.5 ml 41.41 ml/m  RA Volume:   18.50 ml  10.42 ml/m LA Vol (A4C):   59.9 ml 33.75 ml/m LA Biplane Vol: 66.8 ml 37.63 ml/m  AORTIC VALVE LVOT Vmax:   152.00 cm/s LVOT Vmean:  96.100 cm/s LVOT VTI:    0.356 m  AORTA Ao Root diam: 3.10 cm Ao Asc diam:  3.60 cm MITRAL VALVE                TRICUSPID VALVE MV Area (PHT): 3.51 cm     TR Peak grad:   30.7 mmHg MV Decel Time: 216 msec     TR Vmax:        277.00 cm/s MV E velocity: 97.40 cm/s MV A velocity: 106.00 cm/s  SHUNTS MV E/A ratio:  0.92         Systemic VTI:  0.36 m                             Systemic Diam: 1.70 cm Carolan Clines Electronically signed by Carolan Clines Signature Date/Time: 10/13/2022/3:34:30 PM    Final     PHYSICAL EXAM Physical Exam  Constitutional: Appears well-developed and well-nourished.  Psych: Affect appropriate to situation Eyes: Normal external eye and conjunctiva. HENT: Normocephalic, no lesions, without obvious abnormality.   Musculoskeletal-no joint tenderness, deformity or swelling Cardiovascular: Normal rate and regular rhythm.  Respiratory: Effort normal, non-labored breathing saturations WNL GI: Soft.  No distension. There is no tenderness.  Skin: WDI   Neuro:  Mental Status: Alert, today she is agitated. Requires re-direction. She asked the same question several times.  Speech fluent without evidence of aphasia.  Able to follow some commands without difficulty. Is now on 2 L 02 to maintain 02 saturation > 92% Cranial Nerves: II:  Visual fields grossly normal,   III,IV, VI: ptosis not present, extra-ocular motions intact bilaterally pupils equal, round, reactive to light and accommodation V,VII: smile symmetric, facial light touch sensation normal bilaterally VIII: hearing normal bilaterally IX,X: uvula rises symmetrically XI: bilateral shoulder shrug XII: midline tongue extension Motor: Right : Upper extremity   4-/5  Left:     Upper extremity   4-/5  Lower extremity   3/5  Lower extremity   3/5 Tone and bulk:decreased tone Sensory: light touch intact throughout, bilaterally Deep Tendon Reflexes: 2+ and symmetric biceps, patella Cerebellar: normal finger-to-nose,  Gait: not tested      ASSESSMENT/PLAN Tina Frank is a 82 y.o. female with PMH significant for DM2, HTN, HLD, Osteoporosis who presents with 1-2 weeks of progressive generalized walker. Having to use cane, walker and now having trouble getting up. Family also noted some slurred speech over the last day and brought her to the hospital.   Workup notable for hypokalemia, MRI brain with a small punctate stroke in the R posterior Internal capsule. She denies prior hx of strokes, no hx of Afib that she is aware of.  Generalized weakness Encephalopathy Delirium  Could be related to electrolyte imbalance - hypokalemia, hypercalcemia, hypomagnesemia Could be due to decreased po intake, dehydration, CHF w/ fluid overload, UTI On IVF and diet orders K, Mg and Ca improved today Pt  clinically improving  ? Stroke - ? punctate R IC infarct. Incidental finding. Small vessel disease CT head No acute abnormality.    CTA head & neck no LVO  MRI  subtle punctate R IC DWI change, however not convinced on my view 2D Echo EF 60-65%; no atrial shunt LDL 62 HgbA1c 6.0 VTE prophylaxis - heparin subq No antithrombotic prior to admission, now on aspirin 81 mg daily.  Therapy recommendations:  SNF  Disposition:  pending  CHF w/ fluid overload Mild respiratory distress with delirium CXR  showed pulmonary edema and volume overload D/c IVF S/p lasix  Hypertension Home meds:  clonidine 0.1mg  daily ,hydralazine 25mg  BID Stable Long-term BP goal normotensive  Hyperlipidemia Home meds:  atorvastatin 10 mg daily, resumed in hospital LDL 62, goal < 70 No high intensity statin due to LDL at goal and the stroke is not convincing Continue home dose of atorvastatin at discharge  Other Stroke Risk Factors Advanced Age >/= 47   Other Active Problems Hypomagnesemia: 1.2 replaced >1.9 Hypokalemia:  2.3> replaced 3.2> replacement ordered Hx of hyperparathyroidism Bradycardia - follows with cardiology Dr. Odis Hollingshead Right shoulder surgery in 04/2022 UTI: continued on Rocephin, sensitivity pending   Hospital day # 1  Valentina Lucks, MSN, NP-C [facility] See AMION or use Epic Chat'  ATTENDING NOTE: I reviewed above note and agree with the assessment and plan. Pt was seen and examined.   RN is at the bedside. Pt lying in bed, restless, delirium, decreased attention and concentration, mild dysarthria, not quite answer questions appropriately but moving all extremities symmetrically. Also found to have tachypnea, CXR concerning for pulmonary edema, s/p lasix for fluid overload. Continued on rocephin for UTI. Electrolyte much improved today. Will follow.   For detailed assessment and plan, please refer to above/below as I have made changes wherever appropriate.   Marvel Plan, MD PhD Stroke Neurology 10/14/2022 1:56 PM    To contact Stroke Continuity provider, please refer to [email or web address]. After hours, contact General Neurology

## 2022-10-14 NOTE — Evaluation (Signed)
Clinical/Bedside Swallow Evaluation Patient Details  Name: Tina Frank MRN: [id number] Date of Birth: [birth year]  Today's Date: 10/14/2022 Time: SLP Start Time (ACUTE ONLY): 1210 SLP Stop Time (ACUTE ONLY): 1223 SLP Time Calculation (min) (ACUTE ONLY): 13 min  Past Medical History:  Past Medical History:  Diagnosis Date   CKD (chronic kidney disease), stage II    nephrologist-- dr Kathrene Bongo  [facility])   Diabetes mellitus type 2, diet-controlled (HCC)    followed by pcp---   (09-20-2019  per pt does not check blood sugar at home)   Fracture of humeral shaft, right, closed 04/25/2022   GERD (gastroesophageal reflux disease)    History of primary hyperparathyroidism    s/p  parathyroidectomy right inferior on 12-02-2012   Humerus fracture 04/25/2022   Hyperlipidemia    Hypertension    followd by nephrologist----  (09-20-2019  per pt had stress test done some time ago , unsure when/ where, thinks told ok)   OA (osteoarthritis)    Osteoporosis    Prosthetic joint infection (HCC)    followed by dr j. hatcher (ID)   left total shoulder arthroplasty 12/ 2016  ; 12/ 2020  revision with explant joint replacement and hemiarthroplasty;  completed 6 month antibiotic 05/ 2021   Vertigo    Past Surgical History:  Past Surgical History:  Procedure Laterality Date   COLONOSCOPY     DILATATION & CURETTAGE/HYSTEROSCOPY WITH MYOSURE N/A 09/28/2019   Procedure: DILATATION & CURETTAGE/HYSTEROSCOPY WITH MYOSURE;  Surgeon: Olivia Mackie, MD;  Location: [facility];  Service: Gynecology;  Laterality: N/A;   EYE SURGERY  yrs ago   laser surgery for retinal tear.  (unilateral , pt unsure which side)   ORIF HUMERUS FRACTURE Right 04/27/2022   Procedure: OPEN REDUCTION INTERNAL FIXATION (ORIF) PROXIMAL HUMERUS FRACTURE;  Surgeon: Bjorn Pippin, MD;  Location: [facility];  Service: Orthopedics;  Laterality: Right;   PARATHYROIDECTOMY N/A 12/02/2012   Procedure: PARATHYROIDECTOMY with  frozen section ;  Surgeon: Velora Heckler, MD;  Location: [facility];  Service: General;  Laterality: N/A;   REVERSE SHOULDER ARTHROPLASTY Right 04/27/2022   Procedure: REVERSE SHOULDER ARTHROPLASTY;  Surgeon: Bjorn Pippin, MD;  Location: [facility];  Service: Orthopedics;  Laterality: Right;   SHOULDER INJECTION Left 07/27/2013   Procedure: SHOULDER INJECTION;  Surgeon: Loreta Ave, MD;  Location[facility];  Service: Orthopedics;  Laterality: Left;   STERIOD INJECTION Right 02/14/2015   Procedure: RIGHT SHOULDER CORTISONE INJECTION;  Surgeon: Loreta Ave, MD;  Location: [facility] OR;  Service: Orthopedics;  Laterality: Right;   TOTAL HIP ARTHROPLASTY Left 07/27/2013   Procedure: TOTAL HIP ARTHROPLASTY ANTERIOR APPROACH;  Surgeon: Loreta Ave, MD;  Location: [facility] OR;  Service: Orthopedics;  Laterality: Left;   TOTAL SHOULDER ARTHROPLASTY Left 02/14/2015   Procedure: LEFT TOTAL SHOULDER ARTHROPLASTY;  Surgeon: Loreta Ave, MD;  Location: [facility] OR;  Service: Orthopedics;  Laterality: Left;   TOTAL SHOULDER REVISION Left 02/09/2019   Procedure: TOTAL SHOULDER REVISION;  Surgeon: Bjorn Pippin, MD;  Location: [facility];  Service: Orthopedics;  Laterality: Left;   HPI:  Tina Frank is an 82 yo female presenting to ED 8/4 with two day history of worsening weakness and word finding difficulty. MRI Brain with subtle punctate focus of diffusion signal abnormality involving posterior limb of R internal capsule. PMH includes CKD, T2DM, GERD, HLD, HTN, OA, vertigo. Started to pocket, increased confusion and BSE ordered.    Assessment / Plan /  Recommendation  Clinical Impression  Pt demonstrates a cognitive based dysphagia and assessed with son present. Yesterday RN and son reported no difficulty with swallowing however today she is holding po's/meds with RN. She is alert although confused, tightly gripping side rails saying "I need to sit down and turn around." Son reports vertigo in the past but pt denied current  dizziness/lightheadedness. She needed encouragment to take water, applesauce and cracker and moderate cues to return her attention to task. Delayed oral transit intermittently with liquid boluses and held 2 trials needing cues to swallow. There were several coughs after water but this was not consistent throughout the evaluation. Transit of applesauce was mildly delayed  and able to clear oral cavity each time. She was able to masticate graham cracker with delays and cues to continue chewing as she became distracted. Recommend downgrade texture to puree (Dys 1), thin liquids, crush meds. Suspect pt will be able to upgrade texture once her mentation improves. ST will continue to follow. SLP Visit Diagnosis: Dysphagia, unspecified (R13.10)    Aspiration Risk  Mild aspiration risk    Diet Recommendation Dysphagia 1 (Puree);Thin liquid    Liquid Administration via: Cup;Straw Medication Administration: Crushed with puree Supervision: Staff to assist with self feeding;Full supervision/cueing for compensatory strategies Compensations: Minimize environmental distractions;Slow rate;Small sips/bites;Lingual sweep for clearance of pocketing Postural Changes: Seated upright at 90 degrees    Other  Recommendations Oral Care Recommendations: Oral care BID    Recommendations for follow up therapy are one component of a multi-disciplinary discharge planning process, led by the attending physician.  Recommendations may be updated based on patient status, additional functional criteria and insurance authorization.  Follow up Recommendations Skilled nursing-short term rehab (<3 hours/day)      Assistance Recommended at Discharge    Functional Status Assessment Patient has had a recent decline in their functional status and demonstrates the ability to make significant improvements in function in a reasonable and predictable amount of time.  Frequency and Duration min 2x/week  2 weeks       Prognosis  Prognosis for improved oropharyngeal function: Good Barriers to Reach Goals: Cognitive deficits      Swallow Study   General Date of Onset: 10/14/22 HPI: Tina Frank is an 82 yo female presenting to ED 8/4 with two day history of worsening weakness and word finding difficulty. MRI Brain with subtle punctate focus of diffusion signal abnormality involving posterior limb of R internal capsule. PMH includes CKD, T2DM, GERD, HLD, HTN, OA, vertigo. Started to pocket, increased confusion and BSE ordered. Type of Study: Bedside Swallow Evaluation Previous Swallow Assessment:  (none) Diet Prior to this Study: Regular;Thin liquids (Level 0) Temperature Spikes Noted: No Respiratory Status: Nasal cannula History of Recent Intubation: No Behavior/Cognition: Alert;Confused;Impulsive;Distractible;Requires cueing Oral Cavity Assessment: Within Functional Limits Oral Care Completed by SLP: No Oral Cavity - Dentition: Adequate natural dentition Vision: Functional for self-feeding Self-Feeding Abilities: Needs assist Patient Positioning: Upright in bed Baseline Vocal Quality: Normal Volitional Cough: Cognitively unable to elicit Volitional Swallow: Unable to elicit    Oral/Motor/Sensory Function Overall Oral Motor/Sensory Function:  (pt unable to adequately participate)   Ice Chips Ice chips: Not tested   Thin Liquid Thin Liquid: Impaired Presentation: Straw Oral Phase Functional Implications: Oral holding Pharyngeal  Phase Impairments: Cough - Immediate    Nectar Thick Nectar Thick Liquid: Not tested   Honey Thick Honey Thick Liquid: Not tested   Puree Puree: Impaired Oral Phase Functional Implications: Prolonged oral transit Pharyngeal Phase  Impairments:  (none)   Solid     Solid: Impaired Oral Phase Functional Implications: Prolonged oral transit (prolonged mastication)      Roque Cash, Breck Coons 10/14/2022,1:05 PM

## 2022-10-14 NOTE — Progress Notes (Signed)
PROGRESS NOTE    Tina Frank  HQI:[id number] DOB: 04-26-1940 DOA: 10/12/2022 PCP: Sigmund Hazel, MD   Brief Narrative:  82 year old with history of HTN, diabetes, HLD, gout comes to the ED with complaints of 2 days of progressive weakness and some word finding difficulty.  Upon admission noted to have multiple electrolyte abnormality including hypokalemia, hypomagnesemia and hypocalcemia.  MRI brain showed acute CVA and patient was admitted to the hospital.  This appears to be very minor and not convincing per neurology.  Recommending aspirin and statin.  Getting electrolyte repletion.  Developed some shortness of breath on 8/6 prompting chest x-ray and further investigation.   Assessment & Plan:  Principal Problem:   Acute CVA (cerebrovascular accident) (HCC) Active Problems:   Hypokalemia   Hypercalcemia   Hypomagnesemia   Essential hypertension   DM (diabetes mellitus) (HCC)   Hyperlipidemia   Positive anti-CCP test   Sinus bradycardia   DNR (do not resuscitate)/DNI(Do Not Intubate)   Reflux esophagitis     Assessment and Plan: * Acute CVA (cerebrovascular accident) (HCC), small punctate right ICA stroke CT head and MRI brain consistent with acute CVA, but not necessarily convincing.  CTA head and neck negative for acute LVO but does show some carotid disease Echocardiogram-EF 60% with grade 1 DD Lipid panel, TSH, A1c 6.0 PT/OT-SNF Speech and swallow eval  Acute mild respiratory distress secondary to volume overload - Chest ray showing pulmonary edema and volume overload.  Will give Lasix 40 mg IV.  Further doses as needed.  Urinary tract infection - Cultures growing Klebsiella, susceptibility pending, empiric IV Rocephin.  Adjust as needed  Hypokalemia/hypomagnesemia/hypophosphatemia Aggressive repletion.    Hypercalcemia From dehydration, improved with fluids  Sinus bradycardia Resolved  Positive anti-CCP test Noted in rheumatology notes. Unclear the  significance of her positive anti-CCP test.  Patient will need to coordinate this to follow-up outpatient rheumatology  Hyperlipidemia Lipitor  DM (diabetes mellitus) (HCC) A1c 6.0.  Essential hypertension Will resume hydralazine and Coreg today.  IV as needed.  Reflux esophagitis Continue protonix 40 mg daily.   DVT prophylaxis: Subcu heparin Code Status: DNR Family Communication: Son at bedside Ongoing workup for CVA and managing her respiratory symptoms.  Eventual SNF      Diet Orders (From admission, onward)     Start     Ordered   10/13/22 0330  Diet heart healthy/carb modified Room service appropriate? Yes; Fluid consistency: Thin  Diet effective now       Question Answer Comment  Diet-HS Snack? Nothing   Room service appropriate? Yes   Fluid consistency: Thin      10/13/22 0329            Subjective: Patient slightly confused this morning.  Some respiratory distress but overall comfortable. Examination: Constitutional: Not in acute distress Respiratory: Bibasilar crackles.  Very mild tachypnea Cardiovascular: Normal sinus rhythm, no rubs Abdomen: Nontender nondistended good bowel sounds Musculoskeletal: No edema noted Skin: No rashes seen Neurologic: CN 2-12 grossly intact.  And nonfocal Psychiatric: Alert to name only.  Poor judgment and insight Objective: Vitals:   10/13/22 2350 10/14/22 0351 10/14/22 0400 10/14/22 0800  BP: (!) 152/75 (!) 178/79    Pulse: 65 69    Resp: 18 16    Temp: 99.1 F (37.3 C) 98.9 F (37.2 C)    TempSrc: Oral Oral    SpO2: 91% (!) 86% 93% 94%  Weight:      Height:        Intake/Output  Summary (Last 24 hours) at 10/14/2022 0811 Last data filed at 10/14/2022 0600 Gross per 24 hour  Intake 260 ml  Output 450 ml  Net -190 ml   Filed Weights   10/13/22 0200  Weight: 70.3 kg    Scheduled Meds:  allopurinol  300 mg Oral Daily   aspirin EC  81 mg Oral Daily   atorvastatin  10 mg Oral QHS   carvedilol  12.5 mg  Oral See admin instructions   feeding supplement  237 mL Oral BID BM   heparin  5,000 Units Subcutaneous Q8H   hydrALAZINE  25 mg Oral BID   magnesium oxide  800 mg Oral BID   pantoprazole  40 mg Oral BID WC   Continuous Infusions:  cefTRIAXone (ROCEPHIN)  IV 1 g (10/13/22 1515)    Nutritional status     Body mass index is 25.79 kg/m.  Data Reviewed:   CBC: Recent Labs  Lab 10/12/22 2013 10/14/22 0248  WBC 8.7 12.1*  NEUTROABS 6.2  --   HGB 11.7* 11.3*  HCT 36.2 34.9*  MCV 89.6 90.6  PLT 167 225   Basic Metabolic Panel: Recent Labs  Lab 10/12/22 2013 10/13/22 0738 10/14/22 0248  NA 138 138 137  K 2.3* 3.2* 3.8  CL 104 108 108  CO2 22 21* 21*  GLUCOSE 112* 84 116*  BUN 13 11 9   CREATININE 0.76 0.92 0.95  CALCIUM 11.3* 10.3 9.9  MG 1.2* 1.9 1.5*  PHOS  --  2.3* 3.4   GFR: Estimated Creatinine Clearance: 44.1 mL/min (by C-G formula based on SCr of 0.95 mg/dL). Liver Function Tests: Recent Labs  Lab 10/12/22 2013 10/13/22 0738  AST 16 14*  ALT 9 10  ALKPHOS 65 56  BILITOT 1.1 1.2  PROT 5.7* 5.1*  ALBUMIN 2.9* 2.6*   Recent Labs  Lab 10/12/22 2013  LIPASE 26   Recent Labs  Lab 10/12/22 2104  AMMONIA <10   Coagulation Profile: Recent Labs  Lab 10/12/22 2013  INR 1.0   Cardiac Enzymes: No results for input(s): "CKTOTAL", "CKMB", "CKMBINDEX", "TROPONINI" in the last 168 hours. BNP (last 3 results) No results for input(s): "PROBNP" in the last 8760 hours. HbA1C: Recent Labs    10/13/22 0030  HGBA1C 6.0*   CBG: No results for input(s): "GLUCAP" in the last 168 hours. Lipid Profile: Recent Labs    10/13/22 0738  CHOL 121  HDL 44  LDLCALC 62  TRIG 74  CHOLHDL 2.8   Thyroid Function Tests: Recent Labs    10/12/22 2013  TSH 1.293   Anemia Panel: No results for input(s): "VITAMINB12", "FOLATE", "FERRITIN", "TIBC", "IRON", "RETICCTPCT" in the last 72 hours. Sepsis Labs: Recent Labs  Lab 10/12/22 2111  LATICACIDVEN 0.8     Recent Results (from the past 240 hour(s))  Resp panel by RT-PCR (RSV, Flu A&B, Covid) Anterior Nasal Swab     Status: None   Collection Time: 10/12/22 10:06 PM   Specimen: Anterior Nasal Swab  Result Value Ref Range Status   SARS Coronavirus 2 by RT PCR NEGATIVE NEGATIVE Final   Influenza A by PCR NEGATIVE NEGATIVE Final   Influenza B by PCR NEGATIVE NEGATIVE Final    Comment: (NOTE) The Xpert Xpress SARS-CoV-2/FLU/RSV plus assay is intended as an aid in the diagnosis of influenza from Nasopharyngeal swab specimens and should not be used as a sole basis for treatment. Nasal washings and aspirates are unacceptable for Xpert Xpress SARS-CoV-2/FLU/RSV testing.  Fact Sheet for Patients:  BloggerCourse.com  Fact Sheet for Healthcare Providers: SeriousBroker.it  This test is not yet approved or cleared by the [place] FDA and has been authorized for detection and/or diagnosis of SARS-CoV-2 by FDA under an Emergency Use Authorization (EUA). This EUA will remain in effect (meaning this test can be used) for the duration of the COVID-19 declaration under Section 564(b)(1) of the Act, 21 U.S.C. section 360bbb-3(b)(1), unless the authorization is terminated or revoked.     Resp Syncytial Virus by PCR NEGATIVE NEGATIVE Final    Comment: (NOTE) Fact Sheet for Patients: BloggerCourse.com  Fact Sheet for Healthcare Providers: SeriousBroker.it  This test is not yet approved or cleared by the [place] FDA and has been authorized for detection and/or diagnosis of SARS-CoV-2 by FDA under an Emergency Use Authorization (EUA). This EUA will remain in effect (meaning this test can be used) for the duration of the COVID-19 declaration under Section 564(b)(1) of the Act, 21 U.S.C. section 360bbb-3(b)(1), unless the authorization is terminated or revoked.  Performed at [facility], [address], [place], [place] [address]   Urine Culture     Status: Abnormal (Preliminary result)   Collection Time: 10/13/22  6:49 AM   Specimen: Urine, Random  Result Value Ref Range Status   Specimen Description URINE, RANDOM  Final   Special Requests NONE Reflexed from [id number]  Final   Culture (A)  Final    >=100,000 COLONIES/mL GRAM NEGATIVE RODS SUSCEPTIBILITIES TO FOLLOW Performed at [facility], [address], [place], [place] [address]    Report Status PENDING  Incomplete         Radiology Studies: ECHOCARDIOGRAM COMPLETE  Result Date: 10/13/2022    ECHOCARDIOGRAM REPORT   Patient Name:   Tina Frank Date of Exam: 10/13/2022 Medical Rec #:  [id number]       Height:       65.0 in Accession #:    [id number]      Weight:       155.0 lb Date of Birth:  07-Dec-1940       BSA:          1.775 m Patient Age:    81 years        BP:           148/52 mmHg Patient Gender: F               HR:           57 bpm. Exam Location:  Inpatient Procedure: 2D Echo, Color Doppler and Cardiac Doppler Indications:    stroke  History:        Patient has prior history of Echocardiogram examinations, most                 recent 10/06/2013. Risk Factors:Diabetes, Hypertension and                 Dyslipidemia.  Sonographer:    Delcie Roch RDCS Referring Phys: [phone] ERIC CHEN IMPRESSIONS  1. Left ventricular ejection fraction, by estimation, is 60 to 65%. The left ventricle has normal function. The left ventricle has no regional wall motion abnormalities. There is mild left ventricular hypertrophy. Left ventricular diastolic parameters are consistent with Grade I diastolic dysfunction (impaired relaxation).  2. Right ventricular systolic function is normal. The right ventricular size is normal. There is normal pulmonary artery systolic pressure. The estimated right ventricular systolic pressure is 33.7 mmHg.  3. Left atrial size was mildly dilated.  4. The mitral valve is normal in structure.  Trivial mitral valve regurgitation.  5. The aortic valve is tricuspid. Aortic valve regurgitation is not visualized. Aortic valve sclerosis/calcification is present, without any evidence of aortic stenosis.  6. The inferior vena cava is normal in size with greater than 50% respiratory variability, suggesting right atrial pressure of 3 mmHg. FINDINGS  Left Ventricle: Left ventricular ejection fraction, by estimation, is 60 to 65%. The left ventricle has normal function. The left ventricle has no regional wall motion abnormalities. The left ventricular internal cavity size was normal in size. There is  mild left ventricular hypertrophy. Left ventricular diastolic parameters are consistent with Grade I diastolic dysfunction (impaired relaxation). Right Ventricle: The right ventricular size is normal. Right ventricular systolic function is normal. There is normal pulmonary artery systolic pressure. The tricuspid regurgitant velocity is 2.77 m/s, and with an assumed right atrial pressure of 3 mmHg,  the estimated right ventricular systolic pressure is 33.7 mmHg. Left Atrium: Left atrial size was mildly dilated. Right Atrium: Right atrial size was normal in size. Pericardium: Trivial pericardial effusion is present. Mitral Valve: The mitral valve is normal in structure. Trivial mitral valve regurgitation. Tricuspid Valve: The tricuspid valve is normal in structure. Tricuspid valve regurgitation is mild. Aortic Valve: The aortic valve is tricuspid. Aortic valve regurgitation is not visualized. Aortic valve sclerosis/calcification is present, without any evidence of aortic stenosis. Pulmonic Valve: Pulmonic valve regurgitation is not visualized. Aorta: The aortic root and ascending aorta are structurally normal, with no evidence of dilitation. Venous: The inferior vena cava is normal in size with greater than 50% respiratory variability, suggesting right atrial pressure of 3 mmHg. IAS/Shunts: No atrial level shunt detected  by color flow Doppler.  LEFT VENTRICLE PLAX 2D LVIDd:         4.20 cm   Diastology LVIDs:         2.40 cm   LV e' medial:    6.42 cm/s LV PW:         1.10 cm   LV E/e' medial:  15.2 LV IVS:        1.10 cm   LV e' lateral:   8.92 cm/s LVOT diam:     1.70 cm   LV E/e' lateral: 10.9 LV SV:         81 LV SV Index:   46 LVOT Area:     2.27 cm  RIGHT VENTRICLE RV Basal diam:  2.60 cm RV S prime:     15.60 cm/s TAPSE (M-mode): 1.9 cm LEFT ATRIUM             Index        RIGHT ATRIUM           Index LA diam:        4.30 cm 2.42 cm/m   RA Area:     10.30 cm LA Vol (A2C):   73.5 ml 41.41 ml/m  RA Volume:   18.50 ml  10.42 ml/m LA Vol (A4C):   59.9 ml 33.75 ml/m LA Biplane Vol: 66.8 ml 37.63 ml/m  AORTIC VALVE LVOT Vmax:   152.00 cm/s LVOT Vmean:  96.100 cm/s LVOT VTI:    0.356 m  AORTA Ao Root diam: 3.10 cm Ao Asc diam:  3.60 cm MITRAL VALVE                TRICUSPID VALVE MV Area (PHT): 3.51 cm     TR Peak grad:   30.7 mmHg MV  Decel Time: 216 msec     TR Vmax:        277.00 cm/s MV E velocity: 97.40 cm/s MV A velocity: 106.00 cm/s  SHUNTS MV E/A ratio:  0.92         Systemic VTI:  0.36 m                             Systemic Diam: 1.70 cm Carolan Clines Electronically signed by Carolan Clines Signature Date/Time: 10/13/2022/3:34:30 PM    Final    CT ANGIO HEAD NECK W WO CM  Result Date: 10/13/2022 CLINICAL DATA:  Initial evaluation for neuro deficit, stroke. EXAM: CT ANGIOGRAPHY HEAD AND NECK WITH AND WITHOUT CONTRAST TECHNIQUE: Multidetector CT imaging of the head and neck was performed using the standard protocol during bolus administration of intravenous contrast. Multiplanar CT image reconstructions and MIPs were obtained to evaluate the vascular anatomy. Carotid stenosis measurements (when applicable) are obtained utilizing NASCET criteria, using the distal internal carotid diameter as the denominator. RADIATION DOSE REDUCTION: This exam was performed according to the departmental dose-optimization program which  includes automated exposure control, adjustment of the mA and/or kV according to patient size and/or use of iterative reconstruction technique. CONTRAST:  75mL OMNIPAQUE IOHEXOL 350 MG/ML SOLN COMPARISON:  Prior CT and MRI from 10/12/2022. FINDINGS: CTA NECK FINDINGS Aortic arch: Partially visualized aortic arch within normal limits for caliber. Bovine branching pattern noted. Moderate atheromatous change about the arch and origin great vessels. No hemodynamically significant stenosis. Right carotid system: Right common and internal carotid arteries are tortuous but patent without dissection. Bulky calcified plaque about the right carotid bulb with associated stenosis of up to 50% by NASCET criteria. Left carotid system: Left common and internal carotid arteries are tortuous but patent without dissection. Bulky calcified plaque about the left carotid bulb without hemodynamically significant greater than 50% stenosis. Vertebral arteries: Both vertebral arteries arise from subclavian arteries. No significant proximal subclavian artery stenosis. Atheromatous plaque at the origins of both vertebral arteries with associated moderate stenoses bilaterally. Vertebral arteries otherwise patent without stenosis or dissection. Skeleton: No discrete or worrisome osseous lesions. Moderately advanced cervical spondylosis. Other neck: No other acute finding. Few scattered thyroid nodules, largest of which measures 1.5 cm on the left (series 7, image 37). Upper chest: Suspected emphysema. Superimposed scattered interlobular septal thickening consistent with pulmonary interstitial congestion. Review of the MIP images confirms the above findings CTA HEAD FINDINGS Anterior circulation: Scattered atheromatous change about the carotid siphons without hemodynamically significant stenosis. A1 segments patent bilaterally. Normal anterior knee artery complex. Anterior cerebral arteries patent without stenosis. No M1 stenosis or occlusion.  No proximal SCA branch occlusion or high-grade stenosis. Distal MCA branches perfused and symmetric. Posterior circulation: Both V4 segments patent without significant stenosis. Both PICA patent. Basilar widely patent without stenosis. Superior cerebellar and posterior cerebral arteries patent bilaterally without stenosis. Venous sinuses: Patent allowing for timing the contrast bolus. Anatomic variants: None significant.  No aneurysm. Review of the MIP images confirms the above findings IMPRESSION: 1. Negative CTA for large vessel occlusion or other emergent finding. 2. Bulky calcified plaque about the right carotid bulb with associated stenosis of up to 50% by NASCET criteria. 3. Atheromatous plaque at the origins of both vertebral arteries with associated moderate stenoses bilaterally. 4. Diffuse tortuosity of the major arterial vasculature of the head and neck, suggesting chronic underlying hypertension. 5. Mild pulmonary interstitial edema/congestion. 6. Few scattered thyroid nodules, largest  of which measures 1.5 cm on the left. Further evaluation with dedicated thyroid ultrasound recommended. This could be performed on a nonemergent outpatient basis. (Ref: J Am Coll Radiol. 2015 Feb;12(2): 143-50). Aortic Atherosclerosis (ICD10-I70.0) and Emphysema (ICD10-J43.9). Electronically Signed   By: Rise Mu M.D.   On: 10/13/2022 02:26   DG Chest Portable 1 View  Result Date: 10/12/2022 CLINICAL DATA:  Speech change, worsening fatigue, decreased mobility, and cough. EXAM: PORTABLE CHEST 1 VIEW COMPARISON:  06/21/2020. FINDINGS: The heart is enlarged and the mediastinal contour is within normal limits. There is atherosclerotic calcification of the aorta. The pulmonary vasculature is distended. There is blunting of the left costophrenic angle, possible small pleural effusion with atelectasis or infiltrate. The right lung is clear. No pneumothorax. Shoulder arthroplasty changes are noted bilaterally.  IMPRESSION: 1. Cardiomegaly with mild pulmonary vascular congestion. 2. Small left pleural effusion with atelectasis or infiltrate. Electronically Signed   By: Thornell Sartorius M.D.   On: 10/12/2022 22:24   MR BRAIN WO CONTRAST  Result Date: 10/12/2022 CLINICAL DATA:  Initial evaluation for neuro deficit, stroke suspected. EXAM: MRI HEAD WITHOUT CONTRAST TECHNIQUE: Multiplanar, multiecho pulse sequences of the brain and surrounding structures were obtained without intravenous contrast. COMPARISON:  Prior CT from earlier the same day. FINDINGS: Brain: Cerebral volume within normal limits for age. Patchy T2/FLAIR hyperintensity involving the periventricular deep white matter both cerebral hemispheres, consistent with chronic small vessel ischemic disease, mild to moderate in nature. Patchy involvement of the pons noted. Subtle punctate focus of diffusion signal abnormality seen involving the posterior limb of the right internal capsule (series 5, image 75). Associated signal loss on ADC map (series 6, image 25). Findings suspicious for a tiny acute ischemic infarct, although this could potentially be artifactual given small size. No associated hemorrhage. No other evidence for acute or subacute ischemia. Gray-white matter disease a shin otherwise maintained. No areas of chronic cortical infarction. Focus of chronic hemosiderin staining present at the right occipital lobe, consistent with prior hemorrhage at this location (series 12, image 31). No acute blood seen at this location on prior head CT. No other chronic intracranial blood products. No mass lesion, midline shift or mass effect. No hydrocephalus or extra-axial fluid collection. Pituitary gland and suprasellar region within normal limits. Vascular: Major intracranial vascular flow voids are maintained. Skull and upper cervical spine: Craniocervical junction within normal limits. Bone marrow signal intensity normal. No scalp soft tissue abnormality.  Sinuses/Orbits: Prior bilateral ocular lens replacement. Paranasal sinuses are clear. No significant mastoid effusion. Other: None. IMPRESSION: 1. Subtle punctate focus of diffusion signal abnormality involving the posterior limb of the right internal capsule. While this could potentially be artifactual given small size, a possible tiny acute ischemic infarct is difficult to exclude, and could be considered in the correct clinical setting. 2. No other acute intracranial abnormality. 3. Chronic hemosiderin staining at the right occipital lobe, consistent with prior hemorrhage at this location. 4. Underlying mild to moderate chronic microvascular ischemic disease. Electronically Signed   By: Rise Mu M.D.   On: 10/12/2022 22:21   CT HEAD WO CONTRAST ( )  Result Date: 10/12/2022 CLINICAL DATA:  Neuro deficit, acute, stroke suspected. Slurred speech, right-sided weakness EXAM: CT HEAD WITHOUT CONTRAST TECHNIQUE: Contiguous axial images were obtained from the base of the skull through the vertex without intravenous contrast. RADIATION DOSE REDUCTION: This exam was performed according to the departmental dose-optimization program which includes automated exposure control, adjustment of the mA and/or kV according to patient size  and/or use of iterative reconstruction technique. COMPARISON:  [facility] FINDINGS: Brain: There is atrophy and chronic small vessel disease changes. No acute intracranial abnormality. Specifically, no hemorrhage, hydrocephalus, mass lesion, acute infarction, or significant intracranial injury. Vascular: No hyperdense vessel or unexpected calcification. Skull: No acute calvarial abnormality. Sinuses/Orbits: No acute findings Other: None IMPRESSION: Atrophy, chronic microvascular disease. No acute intracranial abnormality. Electronically Signed   By: Charlett Nose M.D.   On: 10/12/2022 21:19           LOS: 1 day   Time spent= 35 mins     Joline Maxcy, MD [facility]  If 7PM-7AM, please contact night-coverage  10/14/2022, 8:11 AM

## 2022-10-14 NOTE — Progress Notes (Signed)
Stroke education given to pt and Son.   Verneda Skill RN Stroke Response

## 2022-10-14 NOTE — TOC Progression Note (Signed)
Transition of Care [facility]) - Progression Note    Patient Details  Name: Tina Frank MRN: [id number] Date of Birth: 10/05/1940  [facility]) CM/SW Contact   Aris Lot, [place] Phone Number: 10/14/2022, 3:47 PM  Clinical Narrative:     CSW spoke with [facility] liaison who confirmed they can offer a SNF bed though that there is a covid outbreak at their SNF so family is deciding if they want to move forward with [facility] or elsewhere.   CSW called and left message with son requesting return call.   Expected Discharge Plan: Skilled Nursing Facility Barriers to Discharge: Continued Medical Work up  Expected Discharge Plan and Services       Living arrangements for the past 2 months: Single Family Home                                       Social Determinants of Health (SDOH) Interventions SDOH Screenings   Food Insecurity: No Food Insecurity (10/13/2022)  Housing: Low Risk  (10/13/2022)  Transportation Needs: No Transportation Needs (10/13/2022)  Utilities: Not At Risk (10/13/2022)  Depression (PHQ2-9): Low Risk  (07/19/2019)  Tobacco Use: Medium Risk (10/12/2022)    Readmission Risk Interventions    04/28/2022    3:15 PM  Readmission Risk Prevention Plan  Transportation Screening Complete  PCP or Specialist Appt within 5-7 Days Complete  Home Care Screening Complete  Medication Review (RN CM) Complete

## 2022-10-14 NOTE — Progress Notes (Signed)
RT called to bedside by RN for increased WOB. RT evaluated pt and pt is confused, agitated and combative. Pt sounds to have upper airway obstruction but vitals are stable and pt is talking to "[employer] on tv remote. RT attempted to give PRN Duoneb but pt repeatedly rips it off swinging arms trying to hit RT. RN aware.

## 2022-10-15 DIAGNOSIS — I639 Cerebral infarction, unspecified: Secondary | ICD-10-CM | POA: Diagnosis not present

## 2022-10-15 DIAGNOSIS — E876 Hypokalemia: Secondary | ICD-10-CM | POA: Diagnosis not present

## 2022-10-15 MED ORDER — SODIUM CHLORIDE 0.9 % IV SOLN
1.0000 g | INTRAVENOUS | Status: AC
  Administered 2022-10-15 – 2022-10-17 (×3): 1 g via INTRAVENOUS
  Filled 2022-10-15 (×3): qty 10

## 2022-10-15 MED ORDER — MAGNESIUM SULFATE 2 GM/50ML IV SOLN
2.0000 g | Freq: Once | INTRAVENOUS | Status: AC
  Administered 2022-10-15: 2 g via INTRAVENOUS
  Filled 2022-10-15: qty 50

## 2022-10-15 MED ORDER — CEPHALEXIN 500 MG PO CAPS: 500.0000 mg | ORAL_CAPSULE | Freq: Three times a day (TID) | ORAL | Status: DC

## 2022-10-15 NOTE — Progress Notes (Signed)
STROKE TEAM PROGRESS NOTE   INTERVAL HISTORY Her son is at the bedside. Pt lying in bed, NAD. Breathing much better than yesterday, no SOB. Urine culture showed klebsiella, sensitive to rocephin.    Vitals:   10/15/22 0322 10/15/22 0730 10/15/22 0920 10/15/22 1118  BP: 135/61 129/71  (!) 106/52  Pulse: (!) 55 (!) 57  65  Resp: 20 17    Temp: 99.3 F (37.4 C) 98.9 F (37.2 C)  98.2 F (36.8 C)  TempSrc: Axillary Axillary  Oral  SpO2: 95% 98% 91% 93%  Weight:      Height:       CBC:  Recent Labs  Lab 10/12/22 2013 10/14/22 0248 10/15/22 0546  WBC 8.7 12.1* 11.0*  NEUTROABS 6.2  --   --   HGB 11.7* 11.3* 11.2*  HCT 36.2 34.9* 34.5*  MCV 89.6 90.6 87.6  PLT 167 225 206   Basic Metabolic Panel:  Recent Labs  Lab 10/13/22 0738 10/14/22 0248 10/15/22 0546  NA 138 137 141  K 3.2* 3.8 3.5  CL 108 108 109  CO2 21* 21* 20*  GLUCOSE 84 116* 126*  BUN 11 9 12   CREATININE 0.92 0.95 0.96  CALCIUM 10.3 9.9 10.3  MG 1.9 1.5* 1.6*  PHOS 2.3* 3.4  --    Lipid Panel:  Recent Labs  Lab 10/13/22 0738  CHOL 121  TRIG 74  HDL 44  CHOLHDL 2.8  VLDL 15  LDLCALC 62   HgbA1c:  Recent Labs  Lab 10/13/22 0030  HGBA1C 6.0*   IMAGING past 24 hours No results found.  PHYSICAL EXAM Physical Exam  Constitutional: Appears well-developed and well-nourished.  Psych: Affect appropriate to situation Eyes: Normal external eye and conjunctiva. HENT: Normocephalic, no lesions, without obvious abnormality.   Musculoskeletal-no joint tenderness, deformity or swelling Cardiovascular: Normal rate and regular rhythm.  Respiratory: Effort normal, non-labored breathing saturations WNL GI: Soft.  No distension. There is no tenderness.  Skin: WDI   Neuro:  Mental Status: Alert, today she is agitated. Requires re-direction. She asked the same question several times.  Speech fluent without evidence of aphasia.  Able to follow some commands without difficulty. Is now on 2 L 02 to  maintain 02 saturation > 92% Cranial Nerves: II:  Visual fields grossly normal,  III,IV, VI: ptosis not present, extra-ocular motions intact bilaterally pupils equal, round, reactive to light and accommodation V,VII: smile symmetric, facial light touch sensation normal bilaterally VIII: hearing normal bilaterally IX,X: uvula rises symmetrically XI: bilateral shoulder shrug XII: midline tongue extension Motor: Right : Upper extremity   4-/5  Left:     Upper extremity   4-/5  Lower extremity   3/5  Lower extremity   3/5 Tone and bulk:decreased tone Sensory: light touch intact throughout, bilaterally Deep Tendon Reflexes: 2+ and symmetric biceps, patella Cerebellar: normal finger-to-nose,  Gait: not tested      ASSESSMENT/PLAN Annica Schwisow is a 82 y.o. female with PMH significant for DM2, HTN, HLD, Osteoporosis who presents with 1-2 weeks of progressive generalized walker. Having to use cane, walker and now having trouble getting up. Family also noted some slurred speech over the last day and brought her to the hospital.   Workup notable for hypokalemia, MRI brain with a small punctate stroke in the R posterior Internal capsule. She denies prior hx of strokes, no hx of Afib that she is aware of.  Generalized weakness Encephalopathy Delirium  Could be related to electrolyte imbalance - hypokalemia, hypercalcemia,  hypomagnesemia Could be due to decreased po intake, dehydration, CHF w/ fluid overload, UTI On IVF and diet orders K, Mg and Ca improved today Pt clinically improving  ? Stroke - ? punctate R IC infarct. Incidental finding. Small vessel disease CT head No acute abnormality.    CTA head & neck no LVO  MRI  subtle punctate R IC DWI change, however not convinced on my view 2D Echo EF 60-65%; no atrial shunt LDL 62 HgbA1c 6.0 VTE prophylaxis - heparin subq No antithrombotic prior to admission, now on aspirin 81 mg daily.  Therapy recommendations:  SNF  Disposition:   pending  CHF w/ fluid overload Mild respiratory distress with delirium CXR showed pulmonary edema and volume overload D/c IVF S/p lasix  Hypertension Home meds:  clonidine 0.1mg  daily ,hydralazine 25mg  BID Stable Long-term BP goal normotensive  Hyperlipidemia Home meds:  atorvastatin 10 mg daily, resumed in hospital LDL 62, goal < 70 No high intensity statin due to LDL at goal and the stroke is not convincing Continue home dose of atorvastatin at discharge  Other Stroke Risk Factors Advanced Age >/= 78   Other Active Problems Hypomagnesemia: 1.2 replaced >1.9 Hypokalemia:  2.3> replaced 3.2> replacement ordered Hx of hyperparathyroidism Bradycardia - follows with cardiology Dr. Odis Hollingshead Right shoulder surgery in 04/2022 UTI: continued on Rocephin, culture showed klebsiella sensitivity to rocephin  Hospital day # 2  Neurology will sign off. Please call with questions. Pt will follow up with stroke clinic NP at [facility] in about 4 weeks. Thanks for the consult.   Marvel Plan, MD PhD Stroke Neurology 10/15/2022 2:40 PM     To contact Stroke Continuity provider, please refer to [email or web address]. After hours, contact General Neurology

## 2022-10-15 NOTE — Progress Notes (Addendum)
PROGRESS NOTE  Tina Frank  ZOX:[id number] DOB: [birth year] DOA: 10/12/2022 PCP: Sigmund Hazel, MD   Brief Narrative: Patient is a 82 year old female with history of hypertension, diabetes, hyperlipidemia, gout who presented with 2 days history of progressive weakness, word finding difficulty.  On presentation, lab work showed hypokalemia, hypomagnesemia, hypophosphatemia.  MRI showed acute CVA.  Neurology consulted.  Admitted for stroke workup, now completed.  Hospital course  remarkable for acute hypoxic respiratory failure requiring some oxygen, volume overload,confusion.  Assessment & Plan:  Principal Problem:   Acute CVA (cerebrovascular accident) (HCC) Active Problems:   Hypokalemia   Hypercalcemia   Hypomagnesemia   Essential hypertension   DM (diabetes mellitus) (HCC)   Hyperlipidemia   Positive anti-CCP test   Sinus bradycardia   DNR (do not resuscitate)/DNI(Do Not Intubate)   Reflux esophagitis   Acute CVA: Presented with progressive weakness, word finding difficulty.  MRI of the brain showed small punctate infarct in the posterior limb of right internal capsule .  Not entirely convincing for stroke.  Neurology was following.  Still workup completed.  CTA head and neck negative for acute LVO but did show some carotid disease.  Echo showed EF of 60%, grade 1 diastolic dysfunction.  A1c of 6.LDL of 62.  PT/OT recommended SNF.  Started on aspirin,low dose lipitor  Acute hypoxic respiratory failure secondary to volume overload: Requiring 2 L of oxygen per minute.  Will continue to try to wean.  Chest x-ray on 8/6 showed features of pulm edema/volume overload.  Given a dose of Lasix 40 mg once.BNP was elevated  UTI: Urine cultures for Klebsiella, started on ceftriaxone.  Plan for 5 days course of antibiotics  Confusion/delirium: Unclear if she has baseline dementia.  Still is confused this morning.  Could be contributed by UTI as well.  Continue delirium precautions, frequent  reorientation  Hypokalemia/hypomagnesemia/hypophosphatemia: Continue to monitor and supplement as needed  Sinus bradycardia: Resolved  Positive anti-CCP test: As per rheumatology note.  Can follow-up with rheumatology as an outpatient  Diabetes type 2: A1c of 6.  Continue to monitor blood sugars  Hypertension: Continue Coreg, hydralazine.  Monitor blood pressure  Reflux esophagitis:On  Protonix        DVT prophylaxis:heparin injection 5,000 Units Start: 10/13/22 0600 SCD's Start: 10/13/22 0151     Code Status: DNR  Family Communication: None at bedside  Patient status:Inpatient  Patient is from :Home  Anticipated discharge to:SNF  Estimated DC date:1-2 days   Consultants: Neurology  Procedures:None  Antimicrobials:  Anti-infectives (From admission, onward)    Start     Dose/Rate Route Frequency Ordered Stop   10/13/22 1115  cefTRIAXone (ROCEPHIN) 1 g in sodium chloride 0.9 % 100 mL IVPB        1 g 200 mL/hr over 30 Minutes Intravenous Every 24 hours 10/13/22 1026 10/16/22 1114       Subjective: Patient seen and examined the bedside today.  She was sleeping.  Hard to wake up but opened her eyes and mumbled.  Looks confused.  Not in look in any kind of distress .Doesnt appear volume overloaded.  Objective: Vitals:   10/14/22 2007 10/14/22 2350 10/15/22 0322 10/15/22 0730  BP: 138/61 (!) 155/130 135/61 129/71  Pulse: (!) 56 93 (!) 55 (!) 57  Resp: 17 18 20 17   Temp: 99 F (37.2 C) 99.4 F (37.4 C) 99.3 F (37.4 C) 98.9 F (37.2 C)  TempSrc: Oral Oral Axillary Axillary  SpO2: 93% 100% 95% 98%  Weight:  Height:        Intake/Output Summary (Last 24 hours) at 10/15/2022 0846 Last data filed at 10/15/2022 0600 Gross per 24 hour  Intake 30 ml  Output 600 ml  Net -570 ml   Filed Weights   10/13/22 0200  Weight: 70.3 kg    Examination:  General exam: Overall comfortable, not in distress HEENT: PERRL Respiratory system:  few crackles on right  base Cardiovascular system: S1 & S2 heard, RRR.  Gastrointestinal system: Abdomen is nondistended, soft and nontender. Central nervous system: Not alert or oriented Extremities: No edema, no clubbing ,no cyanosis Skin: No rashes, no ulcers,no icterus     Data Reviewed: I have personally reviewed following labs and imaging studies  CBC: Recent Labs  Lab 10/12/22 2013 10/14/22 0248 10/15/22 0546  WBC 8.7 12.1* 11.0*  NEUTROABS 6.2  --   --   HGB 11.7* 11.3* 11.2*  HCT 36.2 34.9* 34.5*  MCV 89.6 90.6 87.6  PLT 167 225 206   Basic Metabolic Panel: Recent Labs  Lab 10/12/22 2013 10/13/22 0738 10/14/22 0248 10/15/22 0546  NA 138 138 137 141  K 2.3* 3.2* 3.8 3.5  CL 104 108 108 109  CO2 22 21* 21* 20*  GLUCOSE 112* 84 116* 126*  BUN 13 11 9 12   CREATININE 0.76 0.92 1.61 0.96  CALCIUM 11.3* 10.3 9.9 10.3  MG 1.2* 1.9 1.5* 1.6*  PHOS  --  2.3* 3.4  --      Recent Results (from the past 240 hour(s))  Resp panel by RT-PCR (RSV, Flu A&B, Covid) Anterior Nasal Swab     Status: None   Collection Time: 10/12/22 10:06 PM   Specimen: Anterior Nasal Swab  Result Value Ref Range Status   SARS Coronavirus 2 by RT PCR NEGATIVE NEGATIVE Final   Influenza A by PCR NEGATIVE NEGATIVE Final   Influenza B by PCR NEGATIVE NEGATIVE Final    Comment: (NOTE) The Xpert Xpress SARS-CoV-2/FLU/RSV plus assay is intended as an aid in the diagnosis of influenza from Nasopharyngeal swab specimens and should not be used as a sole basis for treatment. Nasal washings and aspirates are unacceptable for Xpert Xpress SARS-CoV-2/FLU/RSV testing.  Fact Sheet for Patients: BloggerCourse.com  Fact Sheet for Healthcare Providers: SeriousBroker.it  This test is not yet approved or cleared by the [place] FDA and has been authorized for detection and/or diagnosis of SARS-CoV-2 by FDA under an Emergency Use Authorization (EUA). This EUA will  remain in effect (meaning this test can be used) for the duration of the COVID-19 declaration under Section 564(b)(1) of the Act, 21 U.S.C. section 360bbb-3(b)(1), unless the authorization is terminated or revoked.     Resp Syncytial Virus by PCR NEGATIVE NEGATIVE Final    Comment: (NOTE) Fact Sheet for Patients: BloggerCourse.com  Fact Sheet for Healthcare Providers: SeriousBroker.it  This test is not yet approved or cleared by the [place] FDA and has been authorized for detection and/or diagnosis of SARS-CoV-2 by FDA under an Emergency Use Authorization (EUA). This EUA will remain in effect (meaning this test can be used) for the duration of the COVID-19 declaration under Section 564(b)(1) of the Act, 21 U.S.C. section 360bbb-3(b)(1), unless the authorization is terminated or revoked.  Performed at [facility], [address] [place], [place] [address]   Urine Culture     Status: Abnormal   Collection Time: 10/13/22  6:49 AM   Specimen: Urine, Random  Result Value Ref Range Status   Specimen  Description URINE, RANDOM  Final   Special Requests   Final    NONE Reflexed from [id number] Performed at [facility], [address], [place], [place] [address]    Culture >=100,000 COLONIES/mL KLEBSIELLA PNEUMONIAE (A)  Final   Report Status 10/15/2022 FINAL  Final   Organism ID, Bacteria KLEBSIELLA PNEUMONIAE (A)  Final      Susceptibility   Klebsiella pneumoniae - MIC*    AMPICILLIN RESISTANT Resistant     CEFAZOLIN <=4 SENSITIVE Sensitive     CEFEPIME <=0.12 SENSITIVE Sensitive     CEFTRIAXONE <=0.25 SENSITIVE Sensitive     CIPROFLOXACIN <=0.25 SENSITIVE Sensitive     GENTAMICIN <=1 SENSITIVE Sensitive     IMIPENEM <=0.25 SENSITIVE Sensitive     NITROFURANTOIN <=16 SENSITIVE Sensitive     TRIMETH/SULFA <=20 SENSITIVE Sensitive     AMPICILLIN/SULBACTAM <=2 SENSITIVE Sensitive     PIP/TAZO <=4 SENSITIVE  Sensitive     * >=100,000 COLONIES/mL KLEBSIELLA PNEUMONIAE     Radiology Studies: DG Chest Port 1 View  Result Date: 10/14/2022 CLINICAL DATA:  [id number] Dyspnea [id number] EXAM: PORTABLE CHEST 1 VIEW COMPARISON:  10/12/2022. FINDINGS: There is prominence of bronchovascular markings with superimposed Kerley B-lines, suggesting congestive heart failure/pulmonary edema. Redemonstration of left retrocardiac airspace opacity obscuring the lateral left hemidiaphragm, descending thoracic aorta and blunting the left lateral costophrenic angle suggesting combination of left lower lobe atelectasis and/or consolidation with pleural effusion. Bilateral costophrenic angles are clear. Stable cardio-mediastinal silhouette. Aortic arch calcifications noted. No acute osseous abnormalities. Bilateral shoulder arthroplasty noted. The soft tissues are within normal limits. IMPRESSION: 1. Findings suggestive of congestive heart failure/pulmonary edema. 2. Left lower lobe atelectasis and/or consolidation with pleural effusion. Electronically Signed   By: Jules Schick M.D.   On: 10/14/2022 08:26   ECHOCARDIOGRAM COMPLETE  Result Date: 10/13/2022    ECHOCARDIOGRAM REPORT   Patient Name:   Tina Frank Date of Exam: 10/13/2022 Medical Rec #:  [id number]       Height:       65.0 in Accession #:    [id number]      Weight:       155.0 lb Date of Birth:  11-Oct-1940       BSA:          1.775 m Patient Age:    81 years        BP:           148/52 mmHg Patient Gender: F               HR:           57 bpm. Exam Location:  Inpatient Procedure: 2D Echo, Color Doppler and Cardiac Doppler Indications:    stroke  History:        Patient has prior history of Echocardiogram examinations, most                 recent 10/06/2013. Risk Factors:Diabetes, Hypertension and                 Dyslipidemia.  Sonographer:    Delcie Roch RDCS Referring Phys: [phone] ERIC CHEN IMPRESSIONS  1. Left ventricular ejection fraction, by estimation, is 60 to 65%. The  left ventricle has normal function. The left ventricle has no regional wall motion abnormalities. There is mild left ventricular hypertrophy. Left ventricular diastolic parameters are consistent with Grade I diastolic dysfunction (impaired relaxation).  2. Right ventricular systolic function is normal. The right ventricular size is  normal. There is normal pulmonary artery systolic pressure. The estimated right ventricular systolic pressure is 33.7 mmHg.  3. Left atrial size was mildly dilated.  4. The mitral valve is normal in structure. Trivial mitral valve regurgitation.  5. The aortic valve is tricuspid. Aortic valve regurgitation is not visualized. Aortic valve sclerosis/calcification is present, without any evidence of aortic stenosis.  6. The inferior vena cava is normal in size with greater than 50% respiratory variability, suggesting right atrial pressure of 3 mmHg. FINDINGS  Left Ventricle: Left ventricular ejection fraction, by estimation, is 60 to 65%. The left ventricle has normal function. The left ventricle has no regional wall motion abnormalities. The left ventricular internal cavity size was normal in size. There is  mild left ventricular hypertrophy. Left ventricular diastolic parameters are consistent with Grade I diastolic dysfunction (impaired relaxation). Right Ventricle: The right ventricular size is normal. Right ventricular systolic function is normal. There is normal pulmonary artery systolic pressure. The tricuspid regurgitant velocity is 2.77 m/s, and with an assumed right atrial pressure of 3 mmHg,  the estimated right ventricular systolic pressure is 33.7 mmHg. Left Atrium: Left atrial size was mildly dilated. Right Atrium: Right atrial size was normal in size. Pericardium: Trivial pericardial effusion is present. Mitral Valve: The mitral valve is normal in structure. Trivial mitral valve regurgitation. Tricuspid Valve: The tricuspid valve is normal in structure. Tricuspid valve  regurgitation is mild. Aortic Valve: The aortic valve is tricuspid. Aortic valve regurgitation is not visualized. Aortic valve sclerosis/calcification is present, without any evidence of aortic stenosis. Pulmonic Valve: Pulmonic valve regurgitation is not visualized. Aorta: The aortic root and ascending aorta are structurally normal, with no evidence of dilitation. Venous: The inferior vena cava is normal in size with greater than 50% respiratory variability, suggesting right atrial pressure of 3 mmHg. IAS/Shunts: No atrial level shunt detected by color flow Doppler.  LEFT VENTRICLE PLAX 2D LVIDd:         4.20 cm   Diastology LVIDs:         2.40 cm   LV e' medial:    6.42 cm/s LV PW:         1.10 cm   LV E/e' medial:  15.2 LV IVS:        1.10 cm   LV e' lateral:   8.92 cm/s LVOT diam:     1.70 cm   LV E/e' lateral: 10.9 LV SV:         81 LV SV Index:   46 LVOT Area:     2.27 cm  RIGHT VENTRICLE RV Basal diam:  2.60 cm RV S prime:     15.60 cm/s TAPSE (M-mode): 1.9 cm LEFT ATRIUM             Index        RIGHT ATRIUM           Index LA diam:        4.30 cm 2.42 cm/m   RA Area:     10.30 cm LA Vol (A2C):   73.5 ml 41.41 ml/m  RA Volume:   18.50 ml  10.42 ml/m LA Vol (A4C):   59.9 ml 33.75 ml/m LA Biplane Vol: 66.8 ml 37.63 ml/m  AORTIC VALVE LVOT Vmax:   152.00 cm/s LVOT Vmean:  96.100 cm/s LVOT VTI:    0.356 m  AORTA Ao Root diam: 3.10 cm Ao Asc diam:  3.60 cm MITRAL VALVE  TRICUSPID VALVE MV Area (PHT): 3.51 cm     TR Peak grad:   30.7 mmHg MV Decel Time: 216 msec     TR Vmax:        277.00 cm/s MV E velocity: 97.40 cm/s MV A velocity: 106.00 cm/s  SHUNTS MV E/A ratio:  0.92         Systemic VTI:  0.36 m                             Systemic Diam: 1.70 cm Carolan Clines Electronically signed by Carolan Clines Signature Date/Time: 10/13/2022/3:34:30 PM    Final     Scheduled Meds:  allopurinol  300 mg Oral Daily   aspirin EC  81 mg Oral Daily   atorvastatin  10 mg Oral QHS   carvedilol  12.5 mg  Oral BID WC   feeding supplement  237 mL Oral BID BM   heparin  5,000 Units Subcutaneous Q8H   hydrALAZINE  25 mg Oral BID   pantoprazole  40 mg Oral BID WC   Continuous Infusions:  cefTRIAXone (ROCEPHIN)  IV 1 g (10/14/22 1143)   magnesium sulfate bolus IVPB 2 g (10/15/22 0755)     LOS: 2 days   Burnadette Pop, MD [facility] P8/09/2022, 8:46 AM

## 2022-10-15 NOTE — Progress Notes (Signed)
Physical Therapy Treatment Patient Details Name: Tina Frank MRN: [id number] DOB: [birth year] Today's Date: 10/15/2022   History of Present Illness Pt is an 82 y/o female admitted 8/4 for hypokalemia and possible punctate infarct of R internal capsule. 8/6 agitated, found to have UTI. PMH: HTN, DM, HLD, gout.    PT Comments  Increased confusion today, requiring hands on assist for all mobility today. More anxious with mobility but tolerated sitting EOB with intermittent min assist. Focused on seated balance, leaning, and transition with positions to and from EOB. Up to max assist with bed mobility. VC throughout, reassurance was helpful. Still leaning and pushing towards left side, but with some fair improvement in this regard. On bed pan at end of session per pt request, RN notified, son entering room end of session, updated. Pt resting comfortably in bed, alarm on, HOB elevated.Patient will continue to benefit from skilled physical therapy services to further improve independence with functional mobility.     If plan is discharge home, recommend the following: Two people to help with walking and/or transfers;Two people to help with bathing/dressing/bathroom;Assistance with cooking/housework;Direct supervision/assist for medications management;Direct supervision/assist for financial management;Assist for transportation;Help with stairs or ramp for entrance   Can travel by private vehicle     No  Equipment Recommendations  None recommended by PT    Recommendations for Other Services       Precautions / Restrictions Precautions Precautions: Fall Restrictions Weight Bearing Restrictions: No     Mobility  Bed Mobility Overal bed mobility: Needs Assistance Bed Mobility: Rolling, Sidelying to Sit, Sit to Sidelying Rolling: Mod assist Sidelying to sit: Max assist, HOB elevated, Used rails     Sit to sidelying: Mod assist, Used rails General bed mobility comments: Mod assist for  rolling, fearful of falling, frequent cues and hand over hand assist for LE leverage and to grasp rail; able to hold once positioned. Max assist for sidelying to sit transition, and mod assist for LEs back into bed with max VC for sequencing onto side.    Transfers                   General transfer comment: Not tolerated today.    Ambulation/Gait               General Gait Details: uable at this time   Comptroller Bed    Modified Rankin (Stroke Patients Only) Modified Rankin (Stroke Patients Only) Pre-Morbid Rankin Score: No significant disability Modified Rankin: Severe disability     Balance Overall balance assessment: Needs assistance Sitting-balance support: Feet supported, Single extremity supported Sitting balance-Leahy Scale: Poor Sitting balance - Comments: Intermittent min assist for midline control, mostly min guard level x10 min EOB. Worked on leaning left and right, able to pull self back to midline from Rt lean on elbow. Fear of falling towards Rt, pushing intermittently towards Lt. Postural control: Left lateral lean                                  Cognition Arousal: Lethargic Behavior During Therapy: Anxious Overall Cognitive Status: Impaired/Different from baseline Area of Impairment: Attention, Following commands, Safety/judgement, Awareness, Problem solving, Orientation                 Orientation Level: Place, Time, Situation Current Attention Level: Selective  Following Commands: Follows one step commands inconsistently, Follows one step commands with increased time Safety/Judgement: Decreased awareness of safety, Decreased awareness of deficits   Problem Solving: Slow processing, Difficulty sequencing, Requires verbal cues, Requires tactile cues, Decreased initiation General Comments: Disoriented, anxious. Able to recall son's names and her own, but otherwise  confused.        Exercises      General Comments        Pertinent Vitals/Pain Pain Assessment Pain Assessment: PAINAD Breathing: normal Negative Vocalization: occasional moan/groan, low speech, negative/disapproving quality Facial Expression: sad, frightened, frown Body Language: tense, distressed pacing, fidgeting Consolability: distracted or reassured by voice/touch PAINAD Score: 4 Pain Location:  (general? Rt shoulder with movement.) Pain Descriptors / Indicators: Guarding, Grimacing Pain Intervention(s): Monitored during session, Repositioned    Home Living                          Prior Function            PT Goals (current goals can now be found in the care plan section) Acute Rehab PT Goals Patient Stated Goal: Get well PT Goal Formulation: With patient/family Time For Goal Achievement: 10/27/22 Potential to Achieve Goals: Good Progress towards PT goals: Progressing toward goals    Frequency    Min 1X/week      PT Plan      Co-evaluation              AM-PAC PT "6 Clicks" Mobility   Outcome Measure  Help needed turning from your back to your side while in a flat bed without using bedrails?: A Lot Help needed moving from lying on your back to sitting on the side of a flat bed without using bedrails?: A Lot Help needed moving to and from a bed to a chair (including a wheelchair)?: Total Help needed standing up from a chair using your arms (e.g., wheelchair or bedside chair)?: Total Help needed to walk in hospital room?: Total Help needed climbing 3-5 steps with a railing? : Total 6 Click Score: 8    End of Session   Activity Tolerance: Other (comment) (anxious with mobility) Patient left: in bed;with call bell/phone within reach;with bed alarm set;Other (comment) (On bed pan per pt urgency reported. RN notified.) Nurse Communication: Mobility status;Need for lift equipment PT Visit Diagnosis: Unsteadiness on feet (R26.81);Repeated  falls (R29.6);Muscle weakness (generalized) (M62.81);History of falling (Z91.81);Difficulty in walking, not elsewhere classified (R26.2);Other symptoms and signs involving the nervous system (R29.898);Pain Pain - Right/Left:  (bil) Pain - part of body: Knee;Shoulder     Time: 9147-8295 PT Time Calculation (min) (ACUTE ONLY): 22 min  Charges:    $Therapeutic Activity: 8-22 mins PT General Charges $$ ACUTE PT VISIT: 1 Visit                     Kathlyn Sacramento, PT, DPT [facility]  Rehabilitation Services Physical Therapist Office: [phone] Website: [email or web address]    Berton Mount 10/15/2022, 12:05 PM

## 2022-10-15 NOTE — Progress Notes (Signed)
Speech Language Pathology Treatment: Dysphagia  Patient Details Name: Tina Frank MRN: [id number] DOB: May 29, 1940 Today's Date: 10/15/2022 Time: 1212-1231 SLP Time Calculation (min) (ACUTE ONLY): 19 min  Assessment / Plan / Recommendation Clinical Impression  Pt seen this morning but called back by RN stating the son present and had questions re: thick liquids. Pt is much less confused than this morning and able to converse with therapist and attend. She was even aware that she was hallucinating some this morning- still needs supervision as confusion has not completely cleared. Pt was found to have a UTI. Her son reports that pt has reflux and has coughed/cleared her throat for a long time Given thin water with occasional mild cough/throat clear with cup, not present with straw. She was downgraded to puree and observed with regular texture with much improved mastication today. Trace lingual residue that she cleared with sip juice. SLP upgraded texture to Dys 3 (chopped meats) and thin liquid, continue to crush pills and remain upright after meals. ST to continue for safety and efficiency with po's.   HPI HPI: Tina Frank is an 82 yo female presenting to ED 8/4 with two day history of worsening weakness and word finding difficulty. MRI Brain with subtle punctate focus of diffusion signal abnormality involving posterior limb of R internal capsule. PMH includes CKD, T2DM, GERD, HLD, HTN, OA, vertigo. Started to pocket, increased confusion and BSE ordered. Found to have UTI.       SLP Plan  Continue with current plan of care      Recommendations for follow up therapy are one component of a multi-disciplinary discharge planning process, led by the attending physician.  Recommendations may be updated based on patient status, additional functional criteria and insurance authorization.    Recommendations  Diet recommendations: Dysphagia 3 (mechanical soft);Thin liquid Liquids provided via:  Cup;Straw Medication Administration: Crushed with puree Supervision: Staff to assist with self feeding;Full supervision/cueing for compensatory strategies Compensations: Minimize environmental distractions;Slow rate;Small sips/bites;Lingual sweep for clearance of pocketing Postural Changes and/or Swallow Maneuvers: Seated upright 90 degrees;Upright 30-60 min after meal                  Oral care BID   Frequent or constant Supervision/Assistance Dysphagia, unspecified (R13.10)     Continue with current plan of care     Royce Macadamia  10/15/2022, 12:43 PM

## 2022-10-15 NOTE — TOC Progression Note (Signed)
Transition of Care [facility]) - Progression Note    Patient Details  Name: Tina Frank MRN: [id number] Date of Birth: 03/19/1940  Transition of Care [facility]) CM/SW Contact  Baldemar Lenis, [place] Phone Number: 10/15/2022, 12:23 PM  Clinical Narrative:   Family would like to pursue placement at [facility], they feel comfortable with precautions in place during COVID outbreak. CSW confirmed with [facility] that bed is available. CSW asked CMA for insurance authorization request. CSW to follow.    Expected Discharge Plan: Skilled Nursing Facility Barriers to Discharge: Continued Medical Work up  Expected Discharge Plan and Services       Living arrangements for the past 2 months: Single Family Home                                       Social Determinants of Health (SDOH) Interventions SDOH Screenings   Food Insecurity: No Food Insecurity (10/13/2022)  Housing: Low Risk  (10/13/2022)  Transportation Needs: No Transportation Needs (10/13/2022)  Utilities: Not At Risk (10/13/2022)  Depression (PHQ2-9): Low Risk  (07/19/2019)  Tobacco Use: Medium Risk (10/12/2022)    Readmission Risk Interventions    04/28/2022    3:15 PM  Readmission Risk Prevention Plan  Transportation Screening Complete  PCP or Specialist Appt within 5-7 Days Complete  Home Care Screening Complete  Medication Review (RN CM) Complete

## 2022-10-15 NOTE — Progress Notes (Signed)
Speech Language Pathology Treatment: Dysphagia  Patient Details Name: Tina Frank MRN: [id number] DOB: [birth year] Today's Date: 10/15/2022 Time: 6213-0865 SLP Time Calculation (min) (ACUTE ONLY): 14 min  Assessment / Plan / Recommendation Clinical Impression  Pt continues with significant confusion this morning and is shaky, jumpy needing max cues to attend to therapist during attempts at po's. Eventually able to take liquid from straw but showing signs of possible aspiration this morning with consistent mild cough. She did cough some yesterday but not as consistent. She held the liquid in oral cavity (mild) needing cues to swallow. She took several bites of applesauce and was able to initiate a swallow fairly timely without lingual residue. Did not give additional po's due to poor ability to sustain attention. Recommend continue Dys 1 when mental status improves. Downgraded liquids to nectar, full assist with po's. ST will follow. Feel she will be able to upgrade back to thin once mentation allows.    HPI HPI: Tina Frank is an 82 yo female presenting to ED 8/4 with two day history of worsening weakness and word finding difficulty. MRI Brain with subtle punctate focus of diffusion signal abnormality involving posterior limb of R internal capsule. PMH includes CKD, T2DM, GERD, HLD, HTN, OA, vertigo. Started to pocket, increased confusion and BSE ordered.      SLP Plan  Continue with current plan of care      Recommendations for follow up therapy are one component of a multi-disciplinary discharge planning process, led by the attending physician.  Recommendations may be updated based on patient status, additional functional criteria and insurance authorization.    Recommendations  Diet recommendations: Nectar-thick liquid;Dysphagia 1 (puree) Liquids provided via: Straw Medication Administration: Crushed with puree Supervision: Trained caregiver to feed patient;Full supervision/cueing for  compensatory strategies Compensations: Minimize environmental distractions;Slow rate;Small sips/bites;Lingual sweep for clearance of pocketing Postural Changes and/or Swallow Maneuvers: Seated upright 90 degrees                  Oral care BID   Frequent or constant Supervision/Assistance Dysphagia, unspecified (R13.10)     Continue with current plan of care     Royce Macadamia  10/15/2022, 9:35 AM

## 2022-10-16 DIAGNOSIS — I639 Cerebral infarction, unspecified: Secondary | ICD-10-CM | POA: Diagnosis not present

## 2022-10-16 MED ORDER — POTASSIUM CHLORIDE 20 MEQ PO PACK
40.0000 meq | PACK | Freq: Once | ORAL | Status: AC
  Administered 2022-10-16: 40 meq via ORAL
  Filled 2022-10-16: qty 2

## 2022-10-16 NOTE — Care Management Important Message (Signed)
Important Message  Patient Details  Name: Tina Frank MRN: [id number] Date of Birth: August 23, 1940   Medicare Important Message Given:  Yes     Dorena Bodo 10/16/2022, 2:09 PM

## 2022-10-16 NOTE — Progress Notes (Signed)
PROGRESS NOTE  Tina Frank  JXB:[id number] DOB: 1940-12-11 DOA: 10/12/2022 PCP: Sigmund Hazel, MD   Brief Narrative: Patient is a 82 year old female with history of hypertension, diabetes, hyperlipidemia, gout who presented with 2 days history of progressive weakness, word finding difficulty.  On presentation, lab work showed hypokalemia, hypomagnesemia, hypophosphatemia.  MRI showed acute CVA.  Neurology consulted.  Admitted for stroke workup, now completed.  Hospital course  remarkable for acute hypoxic respiratory failure requiring some oxygen, volume overload,confusion now significantly better.  She remains hemodynamically stable for discharge to SNF whenever possible  Assessment & Plan:  Principal Problem:   Acute CVA (cerebrovascular accident) (HCC) Active Problems:   Hypokalemia   Hypercalcemia   Hypomagnesemia   Essential hypertension   DM (diabetes mellitus) (HCC)   Hyperlipidemia   Positive anti-CCP test   Sinus bradycardia   DNR (do not resuscitate)/DNI(Do Not Intubate)   Reflux esophagitis   Acute CVA: Presented with progressive weakness, word finding difficulty.  MRI of the brain showed small punctate infarct in the posterior limb of right internal capsule .  Not entirely convincing for stroke.  Neurology was following.  Still workup completed.  CTA head and neck negative for acute LVO but did show some carotid disease.  Echo showed EF of 60%, grade 1 diastolic dysfunction.  A1c of 6.LDL of 62.  PT/OT recommended SNF.  Started on aspirin,low dose lipitor  Acute hypoxic respiratory failure secondary to volume overload: Chest x-ray on 8/6 showed features of pulm edema/volume overload.  Given a dose of Lasix 40 mg once.BNP was elevated.  She appears euvolemic today.  Remains on room air.  UTI: Urine cultures for Klebsiella, started on ceftriaxone,day 4/5  Confusion/delirium: Unclear if she has baseline dementia.  Could be contributed by UTI as well.  Continue delirium  precautions, frequent reorientation.  This morning she was calm and cooperative.  Hypokalemia/hypomagnesemia/hypophosphatemia: Continue to monitor and supplement as needed  Sinus bradycardia: Resolved  Positive anti-CCP test: As per rheumatology note.  Can follow-up with rheumatology as an outpatient  Diabetes type 2: A1c of 6.  Continue to monitor blood sugars  Hypertension: Continue Coreg, hydralazine.  Monitor blood pressure  Reflux esophagitis:On  Protonix        DVT prophylaxis:heparin injection 5,000 Units Start: 10/13/22 0600 SCD's Start: 10/13/22 0151     Code Status: DNR  Family Communication: Discussed with son at bedside on 8/8  Patient status:Inpatient  Patient is from :Home  Anticipated discharge to:SNF  Estimated DC date:whenever possible   Consultants: Neurology  Procedures:None  Antimicrobials:  Anti-infectives (From admission, onward)    Start     Dose/Rate Route Frequency Ordered Stop   10/15/22 1200  cephALEXin (KEFLEX) capsule 500 mg  Status:  Discontinued        500 mg Oral Every 8 hours 10/15/22 0851 10/15/22 0915   10/15/22 1015  cefTRIAXone (ROCEPHIN) 1 g in sodium chloride 0.9 % 100 mL IVPB        1 g 200 mL/hr over 30 Minutes Intravenous Every 24 hours 10/15/22 0915 10/18/22 1014   10/13/22 1115  cefTRIAXone (ROCEPHIN) 1 g in sodium chloride 0.9 % 100 mL IVPB  Status:  Discontinued        1 g 200 mL/hr over 30 Minutes Intravenous Every 24 hours 10/13/22 1026 10/15/22 0851       Subjective: Patient seen and examined at bedside today.  Hemodynamically stable.  Comfortable.  Taking her breakfast.  Son at bedside.  Calm and cooperative.  Not in any kind of distress.  On room air.  Objective: Vitals:   10/15/22 2357 10/16/22 0408 10/16/22 0549 10/16/22 0801  BP: (!) 101/59 135/64  127/61  Pulse: 61 (!) 58  64  Resp:    18  Temp: 99.3 F (37.4 C) 98.4 F (36.9 C)  99.3 F (37.4 C)  TempSrc: Oral Oral  Oral  SpO2: 91% 95% 94% 90%   Weight:      Height:        Intake/Output Summary (Last 24 hours) at 10/16/2022 1053 Last data filed at 10/15/2022 2215 Gross per 24 hour  Intake 160 ml  Output --  Net 160 ml   Filed Weights   10/13/22 0200  Weight: 70.3 kg    Examination:  General exam: Overall comfortable, not in distress HEENT: PERRL Respiratory system: No wheezing or crackles Cardiovascular system: S1 & S2 heard, RRR.  Gastrointestinal system: Abdomen is nondistended, soft and nontender. Central nervous system: Alert, awake, obeys commands  extremities: No edema, no clubbing ,no cyanosis Skin: No rashes, no ulcers,no icterus     Data Reviewed: I have personally reviewed following labs and imaging studies  CBC: Recent Labs  Lab 10/12/22 2013 10/14/22 0248 10/15/22 0546  WBC 8.7 12.1* 11.0*  NEUTROABS 6.2  --   --   HGB 11.7* 11.3* 11.2*  HCT 36.2 34.9* 34.5*  MCV 89.6 90.6 87.6  PLT 167 225 206   Basic Metabolic Panel: Recent Labs  Lab 10/12/22 2013 10/13/22 0738 10/14/22 0248 10/15/22 0546 10/16/22 0043  NA 138 138 137 141 133*  K 2.3* 3.2* 3.8 3.5 3.2*  CL 104 108 108 109 102  CO2 22 21* 21* 20* 22  GLUCOSE 112* 84 116* 126* 129*  BUN 13 11 9 12 20   CREATININE 0.76 0.92 0.95 0.96 0.96  CALCIUM 11.3* 10.3 9.9 10.3 10.1  MG 1.2* 1.9 1.5* 1.6* 1.9  PHOS  --  2.3* 3.4  --  2.6     Recent Results (from the past 240 hour(s))  Resp panel by RT-PCR (RSV, Flu A&B, Covid) Anterior Nasal Swab     Status: None   Collection Time: 10/12/22 10:06 PM   Specimen: Anterior Nasal Swab  Result Value Ref Range Status   SARS Coronavirus 2 by RT PCR NEGATIVE NEGATIVE Final   Influenza A by PCR NEGATIVE NEGATIVE Final   Influenza B by PCR NEGATIVE NEGATIVE Final    Comment: (NOTE) The Xpert Xpress SARS-CoV-2/FLU/RSV plus assay is intended as an aid in the diagnosis of influenza from Nasopharyngeal swab specimens and should not be used as a sole basis for treatment. Nasal washings and aspirates  are unacceptable for Xpert Xpress SARS-CoV-2/FLU/RSV testing.  Fact Sheet for Patients: BloggerCourse.com  Fact Sheet for Healthcare Providers: SeriousBroker.it  This test is not yet approved or cleared by the [place] FDA and has been authorized for detection and/or diagnosis of SARS-CoV-2 by FDA under an Emergency Use Authorization (EUA). This EUA will remain in effect (meaning this test can be used) for the duration of the COVID-19 declaration under Section 564(b)(1) of the Act, 21 U.S.C. section 360bbb-3(b)(1), unless the authorization is terminated or revoked.     Resp Syncytial Virus by PCR NEGATIVE NEGATIVE Final    Comment: (NOTE) Fact Sheet for Patients: BloggerCourse.com  Fact Sheet for Healthcare Providers: SeriousBroker.it  This test is not yet approved or cleared by the [place] FDA and has been authorized for detection and/or diagnosis of SARS-CoV-2 by FDA under  an Emergency Use Authorization (EUA). This EUA will remain in effect (meaning this test can be used) for the duration of the COVID-19 declaration under Section 564(b)(1) of the Act, 21 U.S.C. section 360bbb-3(b)(1), unless the authorization is terminated or revoked.  Performed at [facility], [address], [place], [place] [address]   Urine Culture     Status: Abnormal   Collection Time: 10/13/22  6:49 AM   Specimen: Urine, Random  Result Value Ref Range Status   Specimen Description URINE, RANDOM  Final   Special Requests   Final    NONE Reflexed from [id number] Performed at [facility], [address] [place], [place] [address]    Culture >=100,000 COLONIES/mL KLEBSIELLA PNEUMONIAE (A)  Final   Report Status 10/15/2022 FINAL  Final   Organism ID, Bacteria KLEBSIELLA PNEUMONIAE (A)  Final      Susceptibility   Klebsiella pneumoniae - MIC*    AMPICILLIN RESISTANT Resistant      CEFAZOLIN <=4 SENSITIVE Sensitive     CEFEPIME <=0.12 SENSITIVE Sensitive     CEFTRIAXONE <=0.25 SENSITIVE Sensitive     CIPROFLOXACIN <=0.25 SENSITIVE Sensitive     GENTAMICIN <=1 SENSITIVE Sensitive     IMIPENEM <=0.25 SENSITIVE Sensitive     NITROFURANTOIN <=16 SENSITIVE Sensitive     TRIMETH/SULFA <=20 SENSITIVE Sensitive     AMPICILLIN/SULBACTAM <=2 SENSITIVE Sensitive     PIP/TAZO <=4 SENSITIVE Sensitive     * >=100,000 COLONIES/mL KLEBSIELLA PNEUMONIAE     Radiology Studies: No results found.  Scheduled Meds:  allopurinol  300 mg Oral Daily   aspirin EC  81 mg Oral Daily   atorvastatin  10 mg Oral QHS   carvedilol  12.5 mg Oral BID WC   feeding supplement  237 mL Oral BID BM   heparin  5,000 Units Subcutaneous Q8H   hydrALAZINE  25 mg Oral BID   pantoprazole  40 mg Oral BID WC   Continuous Infusions:  cefTRIAXone (ROCEPHIN)  IV 1 g (10/16/22 0816)     LOS: 3 days   Burnadette Pop, MD [facility] P8/10/2022, 10:53 AM

## 2022-10-16 NOTE — Plan of Care (Signed)
  Problem: Education: Goal: Knowledge of disease or condition will improve Outcome: Progressing   Problem: Ischemic Stroke/TIA Tissue Perfusion: Goal: Complications of ischemic stroke/TIA will be minimized Outcome: Progressing   

## 2022-10-16 NOTE — TOC Progression Note (Addendum)
Transition of Care [facility]) - Progression Note    Patient Details  Name: Tina Frank MRN: [id number] Date of Birth: Jun 11, 1940  Transition of [facility]) CM/SW Contact  Baldemar Lenis, [place] Phone Number: 10/16/2022, 10:12 AM  Clinical Narrative:   Authorization still pending for transition to [facility]. CSW to follow.  UPDATE: CMA provided update that patient's insurance has gone to peer to peer review. CSW passed information on to MD. CSW to follow.    Expected Discharge Plan: Skilled Nursing Facility Barriers to Discharge: Continued Medical Work up  Expected Discharge Plan and Services       Living arrangements for the past 2 months: Single Family Home                                       Social Determinants of Health (SDOH) Interventions SDOH Screenings   Food Insecurity: No Food Insecurity (10/13/2022)  Housing: Low Risk  (10/13/2022)  Transportation Needs: No Transportation Needs (10/13/2022)  Utilities: Not At Risk (10/13/2022)  Depression (PHQ2-9): Low Risk  (07/19/2019)  Tobacco Use: Medium Risk (10/12/2022)    Readmission Risk Interventions    04/28/2022    3:15 PM  Readmission Risk Prevention Plan  Transportation Screening Complete  PCP or Specialist Appt within 5-7 Days Complete  Home Care Screening Complete  Medication Review (RN CM) Complete

## 2022-10-16 NOTE — Progress Notes (Signed)
Occupational Therapy Treatment Patient Details Name: Tina Frank MRN: [id number] DOB: 10-04-1940 Today's Date: 10/16/2022   History of present illness Pt is an 82 y/o female admitted 8/4 for hypokalemia and possible punctate infarct of R internal capsule. 8/6 agitated, found to have UTI. PMH: HTN, DM, HLD, gout.   OT comments  Pt remains very limited by anxiety and fear of falling, often pushing back against therapists when attempting to assist with movement. Pt requiring Mod A for bed mobility, able to maintain sitting balance today. However, when attempting to stand with Stedy, pt requiring Total A x 2 to initiate with pt then pushing back heavily. Unable to progress OOB due to pt anxiety. Continue to recommend continued inpatient follow up therapy, <3 hours/day at DC as pt unable to safely care for self at home.      If plan is discharge home, recommend the following:  Two people to help with walking and/or transfers;Two people to help with bathing/dressing/bathroom   Equipment Recommendations  Hospital bed;Hoyer lift;Wheelchair cushion (measurements OT);Wheelchair (measurements OT)    Recommendations for Other Services      Precautions / Restrictions Precautions Precautions: Fall Precaution Comments: very anxious/fear of falling Restrictions Weight Bearing Restrictions: No       Mobility Bed Mobility Overal bed mobility: Needs Assistance Bed Mobility: Supine to Sit, Sit to Supine     Supine to sit: Mod assist, HOB elevated, Used rails Sit to supine: Mod assist   General bed mobility comments: able to assist moving LE towards side of bed, hand over hand to reach to bedrail in which pt was able to help lift trunk. problem solving cues needed to return to supine with BLE assist. pt often saying "wait. hold on a minute" with each movement    Transfers Overall transfer level: Needs assistance Equipment used: Ambulation equipment used Transfers: Sit to/from Stand Sit to  Stand: Total assist, +2 physical assistance, +2 safety/equipment           General transfer comment: extended coaxing needed to have pt reach for stedy bars with pad around back to lean forward. x 2 trials with pt pushing back/resisting - Total A x 2 and unable to stand fully  up Transfer via Lift Equipment: Stedy   Balance Overall balance assessment: Needs assistance Sitting-balance support: Feet supported, Single extremity supported Sitting balance-Leahy Scale: Fair     Standing balance support: Bilateral upper extremity supported, Reliant on assistive device for balance Standing balance-Leahy Scale: Zero                             ADL either performed or assessed with clinical judgement   ADL Overall ADL's : Needs assistance/impaired                                       General ADL Comments: Focus on attempts to combat anxiety and progress OOB    Extremity/Trunk Assessment Upper Extremity Assessment Upper Extremity Assessment: Generalized weakness   Lower Extremity Assessment Lower Extremity Assessment: Defer to PT evaluation        Vision   Vision Assessment?: No apparent visual deficits   Perception     Praxis      Cognition Arousal: Alert Behavior During Therapy: Anxious Overall Cognitive Status: Impaired/Different from baseline Area of Impairment: Attention, Following commands, Safety/judgement, Awareness, Problem solving, Orientation  Current Attention Level: Selective   Following Commands: Follows one step commands inconsistently, Follows one step commands with increased time Safety/Judgement: Decreased awareness of safety, Decreased awareness of deficits Awareness: Emergent Problem Solving: Slow processing, Difficulty sequencing, Requires verbal cues, Requires tactile cues, Decreased initiation General Comments: pt oriented, able to follow commands but highly limited by anxiety and fear of falling  - often pushing back/not following directions due to this despite being capable.        Exercises      Shoulder Instructions       General Comments Son at bedside, attempting to encourage pt but difficult to break through the anxiety    Pertinent Vitals/ Pain       Pain Assessment Pain Assessment: Faces Faces Pain Scale: Hurts a little bit Pain Location: knees Pain Descriptors / Indicators: Grimacing Pain Intervention(s): Monitored during session  Home Living                                          Prior Functioning/Environment              Frequency  Min 1X/week        Progress Toward Goals  OT Goals(current goals can now be found in the care plan section)  Progress towards OT goals: Not progressing toward goals - comment  Acute Rehab OT Goals Patient Stated Goal: lay back down OT Goal Formulation: With patient Time For Goal Achievement: 10/27/22 Potential to Achieve Goals: Good ADL Goals Pt Will Perform Lower Body Bathing: with min assist;sit to/from stand Pt Will Perform Lower Body Dressing: with min assist;sit to/from stand Pt Will Transfer to Toilet: with min assist;stand pivot transfer;bedside commode  Plan      Co-evaluation                 AM-PAC OT "6 Clicks" Daily Activity     Outcome Measure   Help from another person eating meals?: A Little Help from another person taking care of personal grooming?: A Little Help from another person toileting, which includes using toliet, bedpan, or urinal?: Total Help from another person bathing (including washing, rinsing, drying)?: A Lot Help from another person to put on and taking off regular upper body clothing?: A Little Help from another person to put on and taking off regular lower body clothing?: A Lot 6 Click Score: 14    End of Session Equipment Utilized During Treatment: Gait belt  OT Visit Diagnosis: Unsteadiness on feet (R26.81);Other abnormalities of gait and  mobility (R26.89);Muscle weakness (generalized) (M62.81)   Activity Tolerance Other (comment) (limited by anxiety)   Patient Left in bed;with bed alarm set;with call bell/phone within reach;with family/visitor present   Nurse Communication Mobility status;Other (comment) (asked about ways to address anxiety, meds?)        Time: 1610-9604 OT Time Calculation (min): 26 min  Charges: OT General Charges $OT Visit: 1 Visit OT Treatments $Therapeutic Activity: 23-37 mins  Bradd Canary, OTR/L Acute Rehab Services Office: [phone]   Lorre Munroe 10/16/2022, 11:28 AM

## 2022-10-16 NOTE — Progress Notes (Signed)
Speech Language Pathology Treatment: Dysphagia;Cognitive-Linquistic  Patient Details Name: Keyuana Senft MRN: [id number] DOB: [birth year] Today's Date: 10/16/2022 Time: 0911-0926 SLP Time Calculation (min) (ACUTE ONLY): 15 min  Assessment / Plan / Recommendation Clinical Impression  Pt's cognition continues to improve. Per son, he stated neurologist feels this was less likely a stroke and more from effects of UTI. She had an SLE and goals; she continues to need some redirection but she is oriented to person, place and partial situation. She is able to sit up eat and attend to breakfast. Pt is on antibiotics and suspect will continue to improve. Plan is to transfer to [facility] where she was supposed to move in before this admission.  Feeding herself but needs occasional assist to scoop food on utensil and manage drinks. There was no oral holding today and mastication functional with clearance of oral cavity. She also did not cough or clear her throat during session. Discussed moving pt up to regular texture and son prefers to keep her on Dys 3 where her meats will be chopped. Continue Dys 3, thin liquids, pills as tolerated (either crushed or whole in puree) and assist with self feeding. No further ST needed at this time.    HPI HPI: FLOREE LITWAK is an 82 yo female presenting to ED 8/4 with two day history of worsening weakness and word finding difficulty. MRI Brain with subtle punctate focus of diffusion signal abnormality involving posterior limb of R internal capsule. PMH includes CKD, T2DM, GERD, HLD, HTN, OA, vertigo. Started to pocket, increased confusion and BSE ordered. Found to have a UTI.      SLP Plan  All goals met;Discharge SLP treatment due to (comment)      Recommendations for follow up therapy are one component of a multi-disciplinary discharge planning process, led by the attending physician.  Recommendations may be updated based on patient status, additional functional  criteria and insurance authorization.    Recommendations  Diet recommendations: Dysphagia 3 (mechanical soft);Thin liquid Liquids provided via: Cup;Straw Medication Administration: Crushed with puree Supervision: Staff to assist with self feeding Compensations: Minimize environmental distractions;Slow rate;Small sips/bites Postural Changes and/or Swallow Maneuvers: Seated upright 90 degrees;Upright 30-60 min after meal                  Oral care BID   Frequent or constant Supervision/Assistance Dysphagia, unspecified (R13.10);Cognitive communication deficit (R41.841)     All goals met;Discharge SLP treatment due to (comment)     Royce Macadamia  10/16/2022, 9:33 AM

## 2022-10-17 ENCOUNTER — Encounter: Payer: Medicare PPO | Admitting: Neurology

## 2022-10-17 DIAGNOSIS — R0989 Other specified symptoms and signs involving the circulatory and respiratory systems: Secondary | ICD-10-CM | POA: Diagnosis not present

## 2022-10-17 DIAGNOSIS — R7303 Prediabetes: Secondary | ICD-10-CM | POA: Diagnosis not present

## 2022-10-17 DIAGNOSIS — Z96611 Presence of right artificial shoulder joint: Secondary | ICD-10-CM | POA: Diagnosis not present

## 2022-10-17 DIAGNOSIS — M25562 Pain in left knee: Secondary | ICD-10-CM | POA: Diagnosis not present

## 2022-10-17 DIAGNOSIS — Z7401 Bed confinement status: Secondary | ICD-10-CM | POA: Diagnosis not present

## 2022-10-17 DIAGNOSIS — S42301D Unspecified fracture of shaft of humerus, right arm, subsequent encounter for fracture with routine healing: Secondary | ICD-10-CM | POA: Diagnosis not present

## 2022-10-17 DIAGNOSIS — J811 Chronic pulmonary edema: Secondary | ICD-10-CM | POA: Diagnosis not present

## 2022-10-17 DIAGNOSIS — M85811 Other specified disorders of bone density and structure, right shoulder: Secondary | ICD-10-CM | POA: Diagnosis not present

## 2022-10-17 DIAGNOSIS — I639 Cerebral infarction, unspecified: Secondary | ICD-10-CM | POA: Diagnosis not present

## 2022-10-17 DIAGNOSIS — M1712 Unilateral primary osteoarthritis, left knee: Secondary | ICD-10-CM | POA: Diagnosis not present

## 2022-10-17 DIAGNOSIS — Z96612 Presence of left artificial shoulder joint: Secondary | ICD-10-CM | POA: Diagnosis not present

## 2022-10-17 DIAGNOSIS — M25561 Pain in right knee: Secondary | ICD-10-CM | POA: Diagnosis not present

## 2022-10-17 DIAGNOSIS — K219 Gastro-esophageal reflux disease without esophagitis: Secondary | ICD-10-CM | POA: Diagnosis not present

## 2022-10-17 DIAGNOSIS — R5383 Other fatigue: Secondary | ICD-10-CM | POA: Diagnosis not present

## 2022-10-17 DIAGNOSIS — E119 Type 2 diabetes mellitus without complications: Secondary | ICD-10-CM | POA: Diagnosis not present

## 2022-10-17 DIAGNOSIS — R531 Weakness: Secondary | ICD-10-CM | POA: Diagnosis not present

## 2022-10-17 DIAGNOSIS — E785 Hyperlipidemia, unspecified: Secondary | ICD-10-CM | POA: Diagnosis not present

## 2022-10-17 DIAGNOSIS — M170 Bilateral primary osteoarthritis of knee: Secondary | ICD-10-CM | POA: Diagnosis not present

## 2022-10-17 DIAGNOSIS — R001 Bradycardia, unspecified: Secondary | ICD-10-CM | POA: Diagnosis not present

## 2022-10-17 DIAGNOSIS — M85812 Other specified disorders of bone density and structure, left shoulder: Secondary | ICD-10-CM | POA: Diagnosis not present

## 2022-10-17 DIAGNOSIS — R2681 Unsteadiness on feet: Secondary | ICD-10-CM | POA: Diagnosis not present

## 2022-10-17 DIAGNOSIS — I69391 Dysphagia following cerebral infarction: Secondary | ICD-10-CM | POA: Diagnosis not present

## 2022-10-17 DIAGNOSIS — M109 Gout, unspecified: Secondary | ICD-10-CM | POA: Diagnosis not present

## 2022-10-17 DIAGNOSIS — R768 Other specified abnormal immunological findings in serum: Secondary | ICD-10-CM | POA: Diagnosis not present

## 2022-10-17 DIAGNOSIS — K5901 Slow transit constipation: Secondary | ICD-10-CM | POA: Diagnosis not present

## 2022-10-17 DIAGNOSIS — I10 Essential (primary) hypertension: Secondary | ICD-10-CM | POA: Diagnosis not present

## 2022-10-17 DIAGNOSIS — E876 Hypokalemia: Secondary | ICD-10-CM | POA: Diagnosis not present

## 2022-10-17 DIAGNOSIS — D509 Iron deficiency anemia, unspecified: Secondary | ICD-10-CM | POA: Diagnosis not present

## 2022-10-17 MED ORDER — MAGNESIUM OXIDE -MG SUPPLEMENT 400 (240 MG) MG PO TABS: 400.0000 mg | ORAL_TABLET | Freq: Every day | ORAL | Status: DC

## 2022-10-17 MED ORDER — SENNOSIDES-DOCUSATE SODIUM 8.6-50 MG PO TABS: 1.0000 | ORAL_TABLET | Freq: Every evening | ORAL | Status: DC | PRN

## 2022-10-17 MED ORDER — MAGNESIUM OXIDE -MG SUPPLEMENT 400 (240 MG) MG PO TABS
400.0000 mg | ORAL_TABLET | Freq: Every day | ORAL | Status: DC
  Administered 2022-10-17: 400 mg via ORAL
  Filled 2022-10-17: qty 1

## 2022-10-17 MED ORDER — ASPIRIN 81 MG PO TBEC: 81.0000 mg | DELAYED_RELEASE_TABLET | Freq: Every day | ORAL | Status: DC

## 2022-10-17 NOTE — TOC Transition Note (Signed)
Transition of Care [facility]) - CM/SW Discharge Note   Patient Details  Name: Tina Frank MRN: [id number] Date of Birth: 12-27-1940  Transition of Care [facility]) CM/SW Contact:  Baldemar Lenis, LCSW Phone Number: 10/17/2022, 4:17 PM   Clinical Narrative:   CSW received insurance approval for patient to admit to SNF. CSW confirmed with [facility] that patient could admit today. CSW met with son, Lorin Picket, at bedside to update and family is in agreement. Transport arranged with [facility] for next available.  Nurse to call report to [phone] x [phone], [facility] Room 66A.    Final next level of care: Skilled Nursing Facility Barriers to Discharge: Barriers Resolved   Patient Goals and CMS Choice CMS Medicare.gov Compare Post Acute Care list provided to:: Patient Represenative (must comment) (Son-Stephen) Choice offered to / list presented to : Adult Children  Discharge Placement                Patient chooses bed at: [facility] Patient to be transferred to facility by: [facility] Name of family member notified: [name] Patient and family notified of of transfer: 10/17/22  Discharge Plan and Services Additional resources added to the After Visit Summary for                                       Social Determinants of Health (SDOH) Interventions SDOH Screenings   Food Insecurity: No Food Insecurity (10/13/2022)  Housing: Low Risk  (10/13/2022)  Transportation Needs: No Transportation Needs (10/13/2022)  Utilities: Not At Risk (10/13/2022)  Depression (PHQ2-9): Low Risk  (07/19/2019)  Tobacco Use: Medium Risk (10/12/2022)     Readmission Risk Interventions    04/28/2022    3:15 PM  Readmission Risk Prevention Plan  Transportation Screening Complete  PCP or Specialist Appt within 5-7 Days Complete  Home Care Screening Complete  Medication Review (RN CM) Complete

## 2022-10-17 NOTE — Progress Notes (Signed)
Physical Therapy Treatment Patient Details Name: Tina Frank MRN: [id number] DOB: [birth year] Today's Date: 10/17/2022   History of Present Illness Pt is an 82 y/o female admitted 8/4 for hypokalemia and possible punctate infarct of R internal capsule. 8/6 agitated, found to have UTI. PMH: HTN, DM, HLD, gout.    PT Comments  Pt seen for PT tx with pt received asleep, requiring max encouragement to increase alertness to participate in session. Pt inconsistently able to follow simple commands with extra time, voices fear of falling. Pt requires max<>total assist for supine>sit, total assist for squat pivot bed>drop arm recliner. Pt tolerates sitting EOB ~10-12 minutes but with limited ability to engage in balance activities sitting EOB. Pt is unsafe to return home alone at this time & requires 24 hr assistance for safety with mobility. Recommend ongoing PT treatment in post acute setting <3 hours/day to maximize independence with mobility & reduce caregiver burden.    If plan is discharge home, recommend the following: Two people to help with walking and/or transfers;Two people to help with bathing/dressing/bathroom;Assistance with cooking/housework;Direct supervision/assist for medications management;Direct supervision/assist for financial management;Assist for transportation;Help with stairs or ramp for entrance   Can travel by private vehicle     No  Equipment Recommendations  None recommended by PT    Recommendations for Other Services       Precautions / Restrictions Precautions Precautions: Fall Precaution Comments: very anxious/fear of falling Restrictions Weight Bearing Restrictions: No     Mobility  Bed Mobility Overal bed mobility: Needs Assistance Bed Mobility: Supine to Sit, Sit to Supine     Supine to sit: Total assist, HOB elevated          Transfers Overall transfer level: Needs assistance   Transfers: Bed to chair/wheelchair/BSC       Squat pivot  transfers: Total assist, From elevated surface          Ambulation/Gait                   Stairs             Wheelchair Mobility     Tilt Bed    Modified Rankin (Stroke Patients Only)       Balance Overall balance assessment: Needs assistance Sitting-balance support: Feet supported, Single extremity supported Sitting balance-Leahy Scale: Fair Sitting balance - Comments: Pt is able to tolerate sitting EOB ~10-12 minutes with min assist<>close supervision with pt leaning L on LUE. Pt pushing backwards at times & unaware of this so pt reports falling backwards. Pt with decreased ability to shift trunk anteriorly. PT attempts to educate pt on head/hips relationship but no evidence of learning. PT sits in front of pt to provide increased stability & comfort, attempts to cue pt to lean forward to "hug" PT but pt unable without MAX multimodal cuing. Postural control: Left lateral lean, Posterior lean                                  Cognition Arousal: Alert Behavior During Therapy: Anxious Overall Cognitive Status: Impaired/Different from baseline Area of Impairment: Attention, Following commands, Safety/judgement, Awareness, Problem solving, Memory, Orientation                       Following Commands: Follows one step commands inconsistently, Follows one step commands with increased time Safety/Judgement: Decreased awareness of safety, Decreased awareness of deficits Awareness: Intellectual Problem Solving:  Slow processing, Difficulty sequencing, Requires verbal cues, Requires tactile cues, Decreased initiation General Comments: Pt requires MAX cuing/encouragement to increase alertness & actively participate then again at end of session pt falling back asleep. At beginning of session PT cued pt to open eyes & pt instead opens mouth. Pt extremely fearful of falling, even reporting this during session. Impaired awareness - pt unaware that she's  the one pushing back while sitting EOB vs falling backwards.        Exercises      General Comments        Pertinent Vitals/Pain Pain Assessment Pain Assessment: Faces Faces Pain Scale: Hurts whole lot Pain Location: B shoulders Pain Descriptors / Indicators: Discomfort, Guarding, Grimacing Pain Intervention(s): Repositioned, Monitored during session, Limited activity within patient's tolerance    Home Living                          Prior Function            PT Goals (current goals can now be found in the care plan section) Acute Rehab PT Goals Patient Stated Goal: go to rehab PT Goal Formulation: With family Time For Goal Achievement: 10/27/22 Potential to Achieve Goals: Fair Progress towards PT goals: Progressing toward goals    Frequency    Min 1X/week      PT Plan      Co-evaluation              AM-PAC PT "6 Clicks" Mobility   Outcome Measure  Help needed turning from your back to your side while in a flat bed without using bedrails?: A Lot Help needed moving from lying on your back to sitting on the side of a flat bed without using bedrails?: Total Help needed moving to and from a bed to a chair (including a wheelchair)?: Total Help needed standing up from a chair using your arms (e.g., wheelchair or bedside chair)?: Total Help needed to walk in hospital room?: Total Help needed climbing 3-5 steps with a railing? : Total 6 Click Score: 7    End of Session   Activity Tolerance: Patient tolerated treatment well Patient left: in chair;with chair alarm set;with call bell/phone within reach;with family/visitor present Nurse Communication: Mobility status PT Visit Diagnosis: Unsteadiness on feet (R26.81);Repeated falls (R29.6);Muscle weakness (generalized) (M62.81);History of falling (Z91.81);Difficulty in walking, not elsewhere classified (R26.2);Other symptoms and signs involving the nervous system (R29.898);Pain Pain - Right/Left:   (bilateral) Pain - part of body: Shoulder     Time: 1610-9604 PT Time Calculation (min) (ACUTE ONLY): 33 min  Charges:    $Therapeutic Activity: 23-37 mins PT General Charges $$ ACUTE PT VISIT: 1 Visit                     Aleda Grana, PT, DPT 10/17/22, 11:18 AM   Sandi Mariscal 10/17/2022, 11:16 AM

## 2022-10-17 NOTE — Progress Notes (Addendum)
1520: Report attempted to [phone] x [phone], [facility] Room 66A.    1642: Report given

## 2022-10-17 NOTE — TOC Progression Note (Addendum)
Transition of Care [facility]) - Progression Note    Patient Details  Name: Quentin Abramowicz MRN: [id number] Date of Birth: 12-08-1940  [facility]) CM/SW Contact  Baldemar Lenis, [place] Phone Number: 10/17/2022, 10:54 AM  Clinical Narrative:   CSW notified by MD that peer to peer was completed, [employer] MD asking for updated therapy note to be sent by 2 pm today. PT aware, will update CSW when note is entered to send to insurance company. CSW to follow.  UPDATE: CSW faxed in PT note and CMA uploaded to portal. Authorization still pending at this time, but should be approved, per MD after peer to peer. CSW sent discharge to [facility] and provided update on auth. CSW to follow.    Expected Discharge Plan: Skilled Nursing Facility Barriers to Discharge: Continued Medical Work up  Expected Discharge Plan and Services       Living arrangements for the past 2 months: Single Family Home                                       Social Determinants of Health (SDOH) Interventions SDOH Screenings   Food Insecurity: No Food Insecurity (10/13/2022)  Housing: Low Risk  (10/13/2022)  Transportation Needs: No Transportation Needs (10/13/2022)  Utilities: Not At Risk (10/13/2022)  Depression (PHQ2-9): Low Risk  (07/19/2019)  Tobacco Use: Medium Risk (10/12/2022)    Readmission Risk Interventions    04/28/2022    3:15 PM  Readmission Risk Prevention Plan  Transportation Screening Complete  PCP or Specialist Appt within 5-7 Days Complete  Home Care Screening Complete  Medication Review (RN CM) Complete

## 2022-10-17 NOTE — Discharge Summary (Signed)
Physician Discharge Summary  Tina Frank ONG:[id number] DOB: Jun 04, 1940 DOA: 10/12/2022  PCP: Sigmund Hazel, MD  Admit date: 10/12/2022 Discharge date: 10/17/2022  Admitted From: Home Disposition:  Home  Discharge Condition:Stable CODE STATUS:FULL Diet recommendation: Dysphagia 3   Brief/Interim Summary: Patient is a 82 year old female with history of hypertension, diabetes, hyperlipidemia, gout who presented with 2 days history of progressive weakness, word finding difficulty.  On presentation, lab work showed hypokalemia, hypomagnesemia, hypophosphatemia.  MRI showed acute CVA.  Neurology consulted.  Admitted for stroke workup, now completed.  Hospital course  remarkable for acute hypoxic respiratory failure requiring some oxygen, volume overload,confusion now significantly better.PT/OT recommending SNF on discharge.  She remains hemodynamically stable for discharge to SNF whenever possible   Following problems were addressed during the hospitalization:  Acute CVA: Presented with progressive weakness, word finding difficulty.  MRI of the brain showed small punctate infarct in the posterior limb of right internal capsule .  Not entirely convincing for stroke.  Neurology was following.  Still workup completed.  CTA head and neck negative for acute LVO but did show some carotid disease.  Echo showed EF of 60%, grade 1 diastolic dysfunction.  A1c of 6.LDL of 62.  PT/OT recommended SNF.  To be continued  on aspirin,low dose lipitor   Acute hypoxic respiratory failure secondary to volume overload: Chest x-ray on 8/6 showed features of pulm edema/volume overload.  Given a dose of Lasix 40 mg once.BNP was elevated.  She appears euvolemic today.  Remains on room air.   UTI: Urine cultures for Klebsiella, completed abx course   Confusion/delirium: Unclear if she has baseline dementia.  Could be contributed by UTI as well.  Continue delirium precautions, frequent reorientation.  This morning she was calm  and cooperative.   Sinus bradycardia: Stable   Positive anti-CCP test: As per rheumatology note.  Can follow-up with rheumatology as an outpatient   Diabetes type 2: A1c of 6.  Continue to monitor blood sugars an outpatient,not on any meds   Hypertension: Continue Coreg, hydralazine.  Monitor blood pressure   Reflux esophagitis:On  Protonix   Discharge Diagnoses:  Principal Problem:   Acute CVA (cerebrovascular accident) (HCC) Active Problems:   Hypokalemia   Hypercalcemia   Hypomagnesemia   Essential hypertension   DM (diabetes mellitus) (HCC)   Hyperlipidemia   Positive anti-CCP test   Sinus bradycardia   DNR (do not resuscitate)/DNI(Do Not Intubate)   Reflux esophagitis    Discharge Instructions  Discharge Instructions     Ambulatory referral to Neurology   Complete by: As directed    An appointment is requested in approximately: 4 weeks   Diet general   Complete by: As directed    Dysphagia 3   Discharge instructions   Complete by: As directed    1)Please take your medications as instructed 2)Follow up with neurology as outpatient   Increase activity slowly   Complete by: As directed       Allergies as of 10/17/2022       Reactions   Abaloparatide Other (See Comments)   "Burred vision, lightheaded" (patient does not recall this in 2024)   Mobic [meloxicam] Hypertension   Crestor [rosuvastatin] Other (See Comments)   Muscle aches   Norvasc [amlodipine Besylate] Swelling, Other (See Comments)   Ankle swelling   Simvastatin Other (See Comments)   Muscle aches Other Reaction(s): high LFTs   Sulfa Antibiotics Rash   Zetia [ezetimibe] Other (See Comments)   Muscle aches  Medication List     STOP taking these medications    telmisartan 80 MG tablet Commonly known as: MICARDIS       TAKE these medications    allopurinol 300 MG tablet Commonly known as: ZYLOPRIM Take 300 mg by mouth daily.   amoxicillin 500 MG capsule Commonly known  as: AMOXIL Take 2,000 mg by mouth See admin instructions. Take 2,000 mg by mouth one hour prior to dental procedures   aspirin EC 81 MG tablet Take 1 tablet (81 mg total) by mouth daily. Swallow whole. Start taking on: October 18, 2022   atorvastatin 10 MG tablet Commonly known as: LIPITOR Take 10 mg by mouth at bedtime.   calcium carbonate 500 MG chewable tablet Commonly known as: TUMS - dosed in mg elemental calcium Chew 1 tablet by mouth 3 (three) times daily as needed for indigestion or heartburn.   carvedilol 12.5 MG tablet Commonly known as: COREG Take 12.5 mg by mouth See admin instructions. Take 12.5 mg by mouth in the morning and at 5 PM   cloNIDine 0.1 MG tablet Commonly known as: CATAPRES Take 0.1 mg by mouth daily as needed (for hypertension- if systolic b/p is over 160).   denosumab 60 MG/ML Sosy injection Commonly known as: PROLIA Inject 60 mg into the skin every 6 (six) months.   furosemide 40 MG tablet Commonly known as: LASIX Take 40 mg by mouth daily as needed for edema.   hydrALAZINE 25 MG tablet Commonly known as: APRESOLINE Take 25 mg by mouth in the morning and at bedtime.   magnesium oxide 400 (240 Mg) MG tablet Commonly known as: MAG-OX Take 1 tablet (400 mg total) by mouth daily. Start taking on: October 18, 2022   pantoprazole 40 MG tablet Commonly known as: PROTONIX Take 40 mg by mouth See admin instructions. Take 40 mg by mouth in the morning and at 5 PM- BEFORE FOOD   senna-docusate 8.6-50 MG tablet Commonly known as: Senokot-S Take 1 tablet by mouth at bedtime as needed for moderate constipation.   TYLENOL 500 MG tablet Generic drug: acetaminophen Take 1,000 mg by mouth in the morning and at bedtime. Will take 1 additional dose if needed in the afternoon   Voltaren 1 % Gel Generic drug: diclofenac Sodium Apply 2-4 g topically 4 (four) times daily as needed (to painful sites).        Contact information for follow-up providers      [facility]. Schedule an appointment as soon as possible for a visit in 1 month(s).   Specialty: Neurology Why: stroke clinic Contact information: [address] [place] [address] [phone]             Contact information for after-discharge care     Destination     [facility] .   Service: Skilled Nursing Contact information: [address] [place] [address] [phone]                    Allergies  Allergen Reactions   Abaloparatide Other (See Comments)    "Burred vision, lightheaded" (patient does not recall this in 2024)   Mobic [Meloxicam] Hypertension   Crestor [Rosuvastatin] Other (See Comments)    Muscle aches   Norvasc [Amlodipine Besylate] Swelling and Other (See Comments)    Ankle swelling   Simvastatin Other (See Comments)    Muscle aches  Other Reaction(s): high LFTs   Sulfa Antibiotics Rash  Zetia [Ezetimibe] Other (See Comments)    Muscle aches    Consultations: Neurology   Procedures/Studies: DG Chest Port 1 View  Result Date: 10/14/2022 CLINICAL DATA:  [id number] Dyspnea [id number] EXAM: PORTABLE CHEST 1 VIEW COMPARISON:  10/12/2022. FINDINGS: There is prominence of bronchovascular markings with superimposed Kerley B-lines, suggesting congestive heart failure/pulmonary edema. Redemonstration of left retrocardiac airspace opacity obscuring the lateral left hemidiaphragm, descending thoracic aorta and blunting the left lateral costophrenic angle suggesting combination of left lower lobe atelectasis and/or consolidation with pleural effusion. Bilateral costophrenic angles are clear. Stable cardio-mediastinal silhouette. Aortic arch calcifications noted. No acute osseous abnormalities. Bilateral shoulder arthroplasty noted. The soft tissues are within normal limits. IMPRESSION: 1. Findings suggestive of congestive heart failure/pulmonary edema.  2. Left lower lobe atelectasis and/or consolidation with pleural effusion. Electronically Signed   By: Jules Schick M.D.   On: 10/14/2022 08:26   ECHOCARDIOGRAM COMPLETE  Result Date: 10/13/2022    ECHOCARDIOGRAM REPORT   Patient Name:   KERSTIN HASELEY Date of Exam: 10/13/2022 Medical Rec #:  [id number]       Height:       65.0 in Accession #:    [id number]      Weight:       155.0 lb Date of Birth:  [birth year]       BSA:          1.775 m Patient Age:    81 years        BP:           148/52 mmHg Patient Gender: F               HR:           57 bpm. Exam Location:  Inpatient Procedure: 2D Echo, Color Doppler and Cardiac Doppler Indications:    stroke  History:        Patient has prior history of Echocardiogram examinations, most                 recent 10/06/2013. Risk Factors:Diabetes, Hypertension and                 Dyslipidemia.  Sonographer:    Delcie Roch RDCS Referring Phys: [phone] ERIC CHEN IMPRESSIONS  1. Left ventricular ejection fraction, by estimation, is 60 to 65%. The left ventricle has normal function. The left ventricle has no regional wall motion abnormalities. There is mild left ventricular hypertrophy. Left ventricular diastolic parameters are consistent with Grade I diastolic dysfunction (impaired relaxation).  2. Right ventricular systolic function is normal. The right ventricular size is normal. There is normal pulmonary artery systolic pressure. The estimated right ventricular systolic pressure is 33.7 mmHg.  3. Left atrial size was mildly dilated.  4. The mitral valve is normal in structure. Trivial mitral valve regurgitation.  5. The aortic valve is tricuspid. Aortic valve regurgitation is not visualized. Aortic valve sclerosis/calcification is present, without any evidence of aortic stenosis.  6. The inferior vena cava is normal in size with greater than 50% respiratory variability, suggesting right atrial pressure of 3 mmHg. FINDINGS  Left Ventricle: Left ventricular ejection fraction,  by estimation, is 60 to 65%. The left ventricle has normal function. The left ventricle has no regional wall motion abnormalities. The left ventricular internal cavity size was normal in size. There is  mild left ventricular hypertrophy. Left ventricular diastolic parameters are consistent with Grade I diastolic dysfunction (impaired relaxation). Right Ventricle: The right ventricular size is normal. Right ventricular systolic function  is normal. There is normal pulmonary artery systolic pressure. The tricuspid regurgitant velocity is 2.77 m/s, and with an assumed right atrial pressure of 3 mmHg,  the estimated right ventricular systolic pressure is 33.7 mmHg. Left Atrium: Left atrial size was mildly dilated. Right Atrium: Right atrial size was normal in size. Pericardium: Trivial pericardial effusion is present. Mitral Valve: The mitral valve is normal in structure. Trivial mitral valve regurgitation. Tricuspid Valve: The tricuspid valve is normal in structure. Tricuspid valve regurgitation is mild. Aortic Valve: The aortic valve is tricuspid. Aortic valve regurgitation is not visualized. Aortic valve sclerosis/calcification is present, without any evidence of aortic stenosis. Pulmonic Valve: Pulmonic valve regurgitation is not visualized. Aorta: The aortic root and ascending aorta are structurally normal, with no evidence of dilitation. Venous: The inferior vena cava is normal in size with greater than 50% respiratory variability, suggesting right atrial pressure of 3 mmHg. IAS/Shunts: No atrial level shunt detected by color flow Doppler.  LEFT VENTRICLE PLAX 2D LVIDd:         4.20 cm   Diastology LVIDs:         2.40 cm   LV e' medial:    6.42 cm/s LV PW:         1.10 cm   LV E/e' medial:  15.2 LV IVS:        1.10 cm   LV e' lateral:   8.92 cm/s LVOT diam:     1.70 cm   LV E/e' lateral: 10.9 LV SV:         81 LV SV Index:   46 LVOT Area:     2.27 cm  RIGHT VENTRICLE RV Basal diam:  2.60 cm RV S prime:     15.60  cm/s TAPSE (M-mode): 1.9 cm LEFT ATRIUM             Index        RIGHT ATRIUM           Index LA diam:        4.30 cm 2.42 cm/m   RA Area:     10.30 cm LA Vol (A2C):   73.5 ml 41.41 ml/m  RA Volume:   18.50 ml  10.42 ml/m LA Vol (A4C):   59.9 ml 33.75 ml/m LA Biplane Vol: 66.8 ml 37.63 ml/m  AORTIC VALVE LVOT Vmax:   152.00 cm/s LVOT Vmean:  96.100 cm/s LVOT VTI:    0.356 m  AORTA Ao Root diam: 3.10 cm Ao Asc diam:  3.60 cm MITRAL VALVE                TRICUSPID VALVE MV Area (PHT): 3.51 cm     TR Peak grad:   30.7 mmHg MV Decel Time: 216 msec     TR Vmax:        277.00 cm/s MV E velocity: 97.40 cm/s MV A velocity: 106.00 cm/s  SHUNTS MV E/A ratio:  0.92         Systemic VTI:  0.36 m                             Systemic Diam: 1.70 cm Carolan Clines Electronically signed by Carolan Clines Signature Date/Time: 10/13/2022/3:34:30 PM    Final    CT ANGIO HEAD NECK W WO CM  Result Date: 10/13/2022 CLINICAL DATA:  Initial evaluation for neuro deficit, stroke. EXAM: CT ANGIOGRAPHY HEAD AND NECK WITH AND WITHOUT CONTRAST TECHNIQUE: Multidetector  CT imaging of the head and neck was performed using the standard protocol during bolus administration of intravenous contrast. Multiplanar CT image reconstructions and MIPs were obtained to evaluate the vascular anatomy. Carotid stenosis measurements (when applicable) are obtained utilizing NASCET criteria, using the distal internal carotid diameter as the denominator. RADIATION DOSE REDUCTION: This exam was performed according to the departmental dose-optimization program which includes automated exposure control, adjustment of the mA and/or kV according to patient size and/or use of iterative reconstruction technique. CONTRAST:  75mL OMNIPAQUE IOHEXOL 350 MG/ML SOLN COMPARISON:  Prior CT and MRI from 10/12/2022. FINDINGS: CTA NECK FINDINGS Aortic arch: Partially visualized aortic arch within normal limits for caliber. Bovine branching pattern noted. Moderate atheromatous change  about the arch and origin great vessels. No hemodynamically significant stenosis. Right carotid system: Right common and internal carotid arteries are tortuous but patent without dissection. Bulky calcified plaque about the right carotid bulb with associated stenosis of up to 50% by NASCET criteria. Left carotid system: Left common and internal carotid arteries are tortuous but patent without dissection. Bulky calcified plaque about the left carotid bulb without hemodynamically significant greater than 50% stenosis. Vertebral arteries: Both vertebral arteries arise from subclavian arteries. No significant proximal subclavian artery stenosis. Atheromatous plaque at the origins of both vertebral arteries with associated moderate stenoses bilaterally. Vertebral arteries otherwise patent without stenosis or dissection. Skeleton: No discrete or worrisome osseous lesions. Moderately advanced cervical spondylosis. Other neck: No other acute finding. Few scattered thyroid nodules, largest of which measures 1.5 cm on the left (series 7, image 37). Upper chest: Suspected emphysema. Superimposed scattered interlobular septal thickening consistent with pulmonary interstitial congestion. Review of the MIP images confirms the above findings CTA HEAD FINDINGS Anterior circulation: Scattered atheromatous change about the carotid siphons without hemodynamically significant stenosis. A1 segments patent bilaterally. Normal anterior knee artery complex. Anterior cerebral arteries patent without stenosis. No M1 stenosis or occlusion. No proximal SCA branch occlusion or high-grade stenosis. Distal MCA branches perfused and symmetric. Posterior circulation: Both V4 segments patent without significant stenosis. Both PICA patent. Basilar widely patent without stenosis. Superior cerebellar and posterior cerebral arteries patent bilaterally without stenosis. Venous sinuses: Patent allowing for timing the contrast bolus. Anatomic variants:  None significant.  No aneurysm. Review of the MIP images confirms the above findings IMPRESSION: 1. Negative CTA for large vessel occlusion or other emergent finding. 2. Bulky calcified plaque about the right carotid bulb with associated stenosis of up to 50% by NASCET criteria. 3. Atheromatous plaque at the origins of both vertebral arteries with associated moderate stenoses bilaterally. 4. Diffuse tortuosity of the major arterial vasculature of the head and neck, suggesting chronic underlying hypertension. 5. Mild pulmonary interstitial edema/congestion. 6. Few scattered thyroid nodules, largest of which measures 1.5 cm on the left. Further evaluation with dedicated thyroid ultrasound recommended. This could be performed on a nonemergent outpatient basis. (Ref: J Am Coll Radiol. 2015 Feb;12(2): 143-50). Aortic Atherosclerosis (ICD10-I70.0) and Emphysema (ICD10-J43.9). Electronically Signed   By: Rise Mu M.D.   On: 10/13/2022 02:26   DG Chest Portable 1 View  Result Date: 10/12/2022 CLINICAL DATA:  Speech change, worsening fatigue, decreased mobility, and cough. EXAM: PORTABLE CHEST 1 VIEW COMPARISON:  06/21/2020. FINDINGS: The heart is enlarged and the mediastinal contour is within normal limits. There is atherosclerotic calcification of the aorta. The pulmonary vasculature is distended. There is blunting of the left costophrenic angle, possible small pleural effusion with atelectasis or infiltrate. The right lung is clear. No pneumothorax. Shoulder  arthroplasty changes are noted bilaterally. IMPRESSION: 1. Cardiomegaly with mild pulmonary vascular congestion. 2. Small left pleural effusion with atelectasis or infiltrate. Electronically Signed   By: Thornell Sartorius M.D.   On: 10/12/2022 22:24   MR BRAIN WO CONTRAST  Result Date: 10/12/2022 CLINICAL DATA:  Initial evaluation for neuro deficit, stroke suspected. EXAM: MRI HEAD WITHOUT CONTRAST TECHNIQUE: Multiplanar, multiecho pulse sequences of the  brain and surrounding structures were obtained without intravenous contrast. COMPARISON:  Prior CT from earlier the same day. FINDINGS: Brain: Cerebral volume within normal limits for age. Patchy T2/FLAIR hyperintensity involving the periventricular deep white matter both cerebral hemispheres, consistent with chronic small vessel ischemic disease, mild to moderate in nature. Patchy involvement of the pons noted. Subtle punctate focus of diffusion signal abnormality seen involving the posterior limb of the right internal capsule (series 5, image 75). Associated signal loss on ADC map (series 6, image 25). Findings suspicious for a tiny acute ischemic infarct, although this could potentially be artifactual given small size. No associated hemorrhage. No other evidence for acute or subacute ischemia. Gray-white matter disease a shin otherwise maintained. No areas of chronic cortical infarction. Focus of chronic hemosiderin staining present at the right occipital lobe, consistent with prior hemorrhage at this location (series 12, image 31). No acute blood seen at this location on prior head CT. No other chronic intracranial blood products. No mass lesion, midline shift or mass effect. No hydrocephalus or extra-axial fluid collection. Pituitary gland and suprasellar region within normal limits. Vascular: Major intracranial vascular flow voids are maintained. Skull and upper cervical spine: Craniocervical junction within normal limits. Bone marrow signal intensity normal. No scalp soft tissue abnormality. Sinuses/Orbits: Prior bilateral ocular lens replacement. Paranasal sinuses are clear. No significant mastoid effusion. Other: None. IMPRESSION: 1. Subtle punctate focus of diffusion signal abnormality involving the posterior limb of the right internal capsule. While this could potentially be artifactual given small size, a possible tiny acute ischemic infarct is difficult to exclude, and could be considered in the correct  clinical setting. 2. No other acute intracranial abnormality. 3. Chronic hemosiderin staining at the right occipital lobe, consistent with prior hemorrhage at this location. 4. Underlying mild to moderate chronic microvascular ischemic disease. Electronically Signed   By: Rise Mu M.D.   On: 10/12/2022 22:21   CT HEAD WO CONTRAST ( )  Result Date: 10/12/2022 CLINICAL DATA:  Neuro deficit, acute, stroke suspected. Slurred speech, right-sided weakness EXAM: CT HEAD WITHOUT CONTRAST TECHNIQUE: Contiguous axial images were obtained from the base of the skull through the vertex without intravenous contrast. RADIATION DOSE REDUCTION: This exam was performed according to the departmental dose-optimization program which includes automated exposure control, adjustment of the mA and/or kV according to patient size and/or use of iterative reconstruction technique. COMPARISON:  [facility] 1624 FINDINGS: Brain: There is atrophy and chronic small vessel disease changes. No acute intracranial abnormality. Specifically, no hemorrhage, hydrocephalus, mass lesion, acute infarction, or significant intracranial injury. Vascular: No hyperdense vessel or unexpected calcification. Skull: No acute calvarial abnormality. Sinuses/Orbits: No acute findings Other: None IMPRESSION: Atrophy, chronic microvascular disease. No acute intracranial abnormality. Electronically Signed   By: Charlett Nose M.D.   On: 10/12/2022 21:19      Subjective: Patient seen and examined at bedside today.  Hemodynamically stable.  No new change in the medical management.  Discharge planning thoroughly discussed with sons at the bedside.  Remains alert and awake, not in acute distress.  On room air  Discharge Exam: Vitals:  10/17/22 0816 10/17/22 1150  BP: (!) 158/58 113/60  Pulse: (!) 55 (!) 55  Resp: 17 15  Temp: 98 F (36.7 C) 97.9 F (36.6 C)  SpO2: 92% 96%   Vitals:   10/16/22 2332 10/17/22 0352 10/17/22 0816 10/17/22 1150  BP: (!)  175/65 (!) 115/95 (!) 158/58 113/60  Pulse: 61 63 (!) 55 (!) 55  Resp:   17 15  Temp: 98.3 F (36.8 C) 98.1 F (36.7 C) 98 F (36.7 C) 97.9 F (36.6 C)  TempSrc: Oral Oral Oral Oral  SpO2: 92% 97% 92% 96%  Weight:      Height:        General: Pt is alert, awake, not in acute distress Cardiovascular: RRR, S1/S2 +, no rubs, no gallops Respiratory: CTA bilaterally, no wheezing, no rhonchi Abdominal: Soft, NT, ND, bowel sounds + Extremities: no edema, no cyanosis    The results of significant diagnostics from this hospitalization (including imaging, microbiology, ancillary and laboratory) are listed below for reference.     Microbiology: Recent Results (from the past 240 hour(s))  Resp panel by RT-PCR (RSV, Flu A&B, Covid) Anterior Nasal Swab     Status: None   Collection Time: 10/12/22 10:06 PM   Specimen: Anterior Nasal Swab  Result Value Ref Range Status   SARS Coronavirus 2 by RT PCR NEGATIVE NEGATIVE Final   Influenza A by PCR NEGATIVE NEGATIVE Final   Influenza B by PCR NEGATIVE NEGATIVE Final    Comment: (NOTE) The Xpert Xpress SARS-CoV-2/FLU/RSV plus assay is intended as an aid in the diagnosis of influenza from Nasopharyngeal swab specimens and should not be used as a sole basis for treatment. Nasal washings and aspirates are unacceptable for Xpert Xpress SARS-CoV-2/FLU/RSV testing.  Fact Sheet for Patients: BloggerCourse.com  Fact Sheet for Healthcare Providers: SeriousBroker.it  This test is not yet approved or cleared by the [place] FDA and has been authorized for detection and/or diagnosis of SARS-CoV-2 by FDA under an Emergency Use Authorization (EUA). This EUA will remain in effect (meaning this test can be used) for the duration of the COVID-19 declaration under Section 564(b)(1) of the Act, 21 U.S.C. section 360bbb-3(b)(1), unless the authorization is terminated or revoked.     Resp Syncytial  Virus by PCR NEGATIVE NEGATIVE Final    Comment: (NOTE) Fact Sheet for Patients: BloggerCourse.com  Fact Sheet for Healthcare Providers: SeriousBroker.it  This test is not yet approved or cleared by the [place] FDA and has been authorized for detection and/or diagnosis of SARS-CoV-2 by FDA under an Emergency Use Authorization (EUA). This EUA will remain in effect (meaning this test can be used) for the duration of the COVID-19 declaration under Section 564(b)(1) of the Act, 21 U.S.C. section 360bbb-3(b)(1), unless the authorization is terminated or revoked.  Performed at [facility], [address], [place], [place] [address]   Urine Culture     Status: Abnormal   Collection Time: 10/13/22  6:49 AM   Specimen: Urine, Random  Result Value Ref Range Status   Specimen Description URINE, RANDOM  Final   Special Requests   Final    NONE Reflexed from [id number] Performed at [facility], [address] [address], [place], [place] [address]    Culture >=100,000 COLONIES/mL KLEBSIELLA PNEUMONIAE (A)  Final   Report Status 10/15/2022 FINAL  Final   Organism ID, Bacteria KLEBSIELLA PNEUMONIAE (A)  Final      Susceptibility   Klebsiella pneumoniae - MIC*    AMPICILLIN  RESISTANT Resistant     CEFAZOLIN <=4 SENSITIVE Sensitive     CEFEPIME <=0.12 SENSITIVE Sensitive     CEFTRIAXONE <=0.25 SENSITIVE Sensitive     CIPROFLOXACIN <=0.25 SENSITIVE Sensitive     GENTAMICIN <=1 SENSITIVE Sensitive     IMIPENEM <=0.25 SENSITIVE Sensitive     NITROFURANTOIN <=16 SENSITIVE Sensitive     TRIMETH/SULFA <=20 SENSITIVE Sensitive     AMPICILLIN/SULBACTAM <=2 SENSITIVE Sensitive     PIP/TAZO <=4 SENSITIVE Sensitive     * >=100,000 COLONIES/mL KLEBSIELLA PNEUMONIAE     Labs: BNP (last 3 results) Recent Labs    10/14/22 0248  BNP 950.8*   Basic Metabolic Panel: Recent Labs  Lab 10/13/22 0738 10/14/22 0248 10/15/22 0546 10/16/22 0043  10/16/22 2344  NA 138 137 141 133* 135  K 3.2* 3.8 3.5 3.2* 3.9  CL 108 108 109 102 103  CO2 21* 21* 20* 22 23  GLUCOSE 84 116* 126* 129* 126*  BUN 11 9 12 20 21   CREATININE 0.92 0.95 0.96 0.96 0.93  CALCIUM 10.3 9.9 10.3 10.1 10.4*  MG 1.9 1.5* 1.6* 1.9 1.7  PHOS 2.3* 3.4  --  2.6  --    Liver Function Tests: Recent Labs  Lab 10/12/22 2013 10/13/22 0738  AST 16 14*  ALT 9 10  ALKPHOS 65 56  BILITOT 1.1 1.2  PROT 5.7* 5.1*  ALBUMIN 2.9* 2.6*   Recent Labs  Lab 10/12/22 2013  LIPASE 26   Recent Labs  Lab 10/12/22 2104  AMMONIA <10   CBC: Recent Labs  Lab 10/12/22 2013 10/14/22 0248 10/15/22 0546  WBC 8.7 12.1* 11.0*  NEUTROABS 6.2  --   --   HGB 11.7* 11.3* 11.2*  HCT 36.2 34.9* 34.5*  MCV 89.6 90.6 87.6  PLT 167 225 206   Cardiac Enzymes: No results for input(s): "CKTOTAL", "CKMB", "CKMBINDEX", "TROPONINI" in the last 168 hours. BNP: Invalid input(s): "POCBNP" CBG: No results for input(s): "GLUCAP" in the last 168 hours. D-Dimer No results for input(s): "DDIMER" in the last 72 hours. Hgb A1c No results for input(s): "HGBA1C" in the last 72 hours. Lipid Profile No results for input(s): "CHOL", "HDL", "LDLCALC", "TRIG", "CHOLHDL", "LDLDIRECT" in the last 72 hours. Thyroid function studies No results for input(s): "TSH", "T4TOTAL", "T3FREE", "THYROIDAB" in the last 72 hours.  Invalid input(s): "FREET3" Anemia work up No results for input(s): "VITAMINB12", "FOLATE", "FERRITIN", "TIBC", "IRON", "RETICCTPCT" in the last 72 hours. Urinalysis    Component Value Date/Time   COLORURINE YELLOW 10/13/2022 0649   APPEARANCEUR HAZY (A) 10/13/2022 0649   LABSPEC 1.026 10/13/2022 0649   PHURINE 6.0 10/13/2022 0649   GLUCOSEU NEGATIVE 10/13/2022 0649   HGBUR NEGATIVE 10/13/2022 0649   BILIRUBINUR NEGATIVE 10/13/2022 0649   KETONESUR NEGATIVE 10/13/2022 0649   PROTEINUR 100 (A) 10/13/2022 0649   UROBILINOGEN 0.2 10/05/2013 2122   NITRITE NEGATIVE  10/13/2022 0649   LEUKOCYTESUR LARGE (A) 10/13/2022 0649   Sepsis Labs Recent Labs  Lab 10/12/22 2013 10/14/22 0248 10/15/22 0546  WBC 8.7 12.1* 11.0*   Microbiology Recent Results (from the past 240 hour(s))  Resp panel by RT-PCR (RSV, Flu A&B, Covid) Anterior Nasal Swab     Status: None   Collection Time: 10/12/22 10:06 PM   Specimen: Anterior Nasal Swab  Result Value Ref Range Status   SARS Coronavirus 2 by RT PCR NEGATIVE NEGATIVE Final   Influenza A by PCR NEGATIVE NEGATIVE Final   Influenza B by PCR NEGATIVE NEGATIVE Final  Comment: (NOTE) The Xpert Xpress SARS-CoV-2/FLU/RSV plus assay is intended as an aid in the diagnosis of influenza from Nasopharyngeal swab specimens and should not be used as a sole basis for treatment. Nasal washings and aspirates are unacceptable for Xpert Xpress SARS-CoV-2/FLU/RSV testing.  Fact Sheet for Patients: BloggerCourse.com  Fact Sheet for Healthcare Providers: SeriousBroker.it  This test is not yet approved or cleared by the [place] FDA and has been authorized for detection and/or diagnosis of SARS-CoV-2 by FDA under an Emergency Use Authorization (EUA). This EUA will remain in effect (meaning this test can be used) for the duration of the COVID-19 declaration under Section 564(b)(1) of the Act, 21 U.S.C. section 360bbb-3(b)(1), unless the authorization is terminated or revoked.     Resp Syncytial Virus by PCR NEGATIVE NEGATIVE Final    Comment: (NOTE) Fact Sheet for Patients: BloggerCourse.com  Fact Sheet for Healthcare Providers: SeriousBroker.it  This test is not yet approved or cleared by the [place] FDA and has been authorized for detection and/or diagnosis of SARS-CoV-2 by FDA under an Emergency Use Authorization (EUA). This EUA will remain in effect (meaning this test can be used) for the duration of  the COVID-19 declaration under Section 564(b)(1) of the Act, 21 U.S.C. section 360bbb-3(b)(1), unless the authorization is terminated or revoked.  Performed at [facility], [address] [place], [place] [address]   Urine Culture     Status: Abnormal   Collection Time: 10/13/22  6:49 AM   Specimen: Urine, Random  Result Value Ref Range Status   Specimen Description URINE, RANDOM  Final   Special Requests   Final    NONE Reflexed from [id number] Performed at [facility], [address] [place], [place] [address]    Culture >=100,000 COLONIES/mL KLEBSIELLA PNEUMONIAE (A)  Final   Report Status 10/15/2022 FINAL  Final   Organism ID, Bacteria KLEBSIELLA PNEUMONIAE (A)  Final      Susceptibility   Klebsiella pneumoniae - MIC*    AMPICILLIN RESISTANT Resistant     CEFAZOLIN <=4 SENSITIVE Sensitive     CEFEPIME <=0.12 SENSITIVE Sensitive     CEFTRIAXONE <=0.25 SENSITIVE Sensitive     CIPROFLOXACIN <=0.25 SENSITIVE Sensitive     GENTAMICIN <=1 SENSITIVE Sensitive     IMIPENEM <=0.25 SENSITIVE Sensitive     NITROFURANTOIN <=16 SENSITIVE Sensitive     TRIMETH/SULFA <=20 SENSITIVE Sensitive     AMPICILLIN/SULBACTAM <=2 SENSITIVE Sensitive     PIP/TAZO <=4 SENSITIVE Sensitive     * >=100,000 COLONIES/mL KLEBSIELLA PNEUMONIAE    Please note: You were cared for by a hospitalist during your hospital stay. Once you are discharged, your primary care physician will handle any further medical issues. Please note that NO REFILLS for any discharge medications will be authorized once you are discharged, as it is imperative that you return to your primary care physician (or establish a relationship with a primary care physician if you do not have one) for your post hospital discharge needs so that they can reassess your need for medications and monitor your lab values.    Time coordinating discharge: 40 minutes  SIGNED:   Burnadette Pop, MD  [facility] 10/17/2022, 1:26  PM Pager [phone]  If 7PM-7AM, please contact night-coverage [email or web address] Password [email or web address]

## 2022-10-17 NOTE — Plan of Care (Signed)

## 2022-10-20 ENCOUNTER — Non-Acute Institutional Stay (SKILLED_NURSING_FACILITY): Payer: Medicare PPO | Admitting: Sports Medicine

## 2022-10-20 ENCOUNTER — Encounter: Payer: Self-pay | Admitting: Sports Medicine

## 2022-10-20 DIAGNOSIS — I10 Essential (primary) hypertension: Secondary | ICD-10-CM | POA: Diagnosis not present

## 2022-10-20 DIAGNOSIS — I639 Cerebral infarction, unspecified: Secondary | ICD-10-CM

## 2022-10-20 DIAGNOSIS — R0989 Other specified symptoms and signs involving the circulatory and respiratory systems: Secondary | ICD-10-CM | POA: Diagnosis not present

## 2022-10-20 DIAGNOSIS — M1712 Unilateral primary osteoarthritis, left knee: Secondary | ICD-10-CM | POA: Diagnosis not present

## 2022-10-20 DIAGNOSIS — R768 Other specified abnormal immunological findings in serum: Secondary | ICD-10-CM

## 2022-10-20 DIAGNOSIS — E785 Hyperlipidemia, unspecified: Secondary | ICD-10-CM

## 2022-10-20 NOTE — Progress Notes (Signed)
Provider:  Venita Sheffield, MD  Location:  [facility] Room Number: N066-A Place of Service:  SNF (31)   PCP: Sigmund Hazel, MD Patient Care Team: Sigmund Hazel, MD as PCP - General (Family Medicine) Glyn Ade, PA-C as Physician Assistant (Dermatology)  Extended Emergency Contact Information Primary Emergency Contact: [name], [place] [address] [place] Home Phone: [phone] Work Phone: [phone] Mobile Phone: [phone] Relation: Son Secondary Emergency Contact: Wurzer,[name]  [place] Phone: [phone] Relation: Son  Code Status: DNR Goals of Care: Advanced Directive information    10/20/2022    9:38 AM  Advanced Directives  Does Patient Have a Medical Advance Directive? No  Would patient like information on creating a medical advance directive? No - Patient declined      Chief Complaint  Patient presents with   New Admit To SNF    Admission    HPI: Patient is a 82 y.o. female seen today for admission to Skilled care.after recent hospitalization for progressive weakness and word finding difficulty. Pt seen and examined in her room, she is sitting comfortably in her wheel chair, she is able to wheel herself. She is requiring assistance with transferring. Pt reports that prior to being hospitalized she was living independently in her house and able to ambulate with a cane.Her family lives close by and frequently checks on her.  She is planning to move to Independent living here at [facility] and is hoping to be able to ambulate independently. Pt knows her name, oriented to time and place.  Denies cough , sob, chest pain, abdominal pain, nausea, vomiting, dysuria, hematuria, bloody or dark stools, dizzy or lightheadedness. Spoke with RN who reports that pt has intermittent confusion ( unknown baseline mentation ) , eating well with no coughing or chocking.  Admit  date: 10/12/2022 Discharge date: 10/17/2022 Discharge Summary  Brief/Interim Summary: Patient is a 82 year old female with history of hypertension, diabetes, hyperlipidemia, gout who presented with 2 days history of progressive weakness, word finding difficulty.  On presentation, lab work showed hypokalemia, hypomagnesemia, hypophosphatemia.  MRI showed acute CVA.  Neurology consulted.  Brief/Interim Summary: Patient is a 82 year old female with history of hypertension, diabetes, hyperlipidemia, gout who presented with 2 days history of progressive weakness, word finding difficulty.  On presentation, lab work showed hypokalemia, hypomagnesemia, hypophosphatemia.  MRI of the brain showed small punctate infarct in the posterior limb of right internal capsule . Not entirely convincing for stroke. Neurology was consulted. CTA head and neck negative for acute LVO but did show some carotid disease. Echo showed EF of 60%, grade 1 diastolic dysfunction. A1c of 6.LDL of 62.  Hospital course  remarkable for acute hypoxic respiratory failure requiring some oxygen, volume overload,confusion now significantly better.PT/OT recommending SNF on discharge.  She remains hemodynamically stable for discharge to SNF whenever possible    Discharge Diagnoses:  Principal Problem:   Acute CVA (cerebrovascular accident) (HCC) Active Problems:   Hypokalemia   Hypercalcemia   Hypomagnesemia   Essential hypertension   DM (diabetes mellitus) (HCC)   Hyperlipidemia   Positive anti-CCP test   Sinus bradycardia   DNR (do not resuscitate)/DNI(Do Not Intubate)   Reflux esophagitis   Discharge Instructions   Discharge Instructions       Ambulatory referral to Neurology   Complete by: As directed      An appointment is requested in approximately: 4 weeks    Diet  general   Complete by: As directed      Dysphagia 3    Discharge instructions   Complete by: As directed   Medication List       STOP taking these medications      telmisartan 80 MG tablet Commonly known as: MICARDIS           TAKE these medications     allopurinol 300 MG tablet Commonly known as: ZYLOPRIM Take 300 mg by mouth daily.    amoxicillin 500 MG capsule Commonly known as: AMOXIL Take 2,000 mg by mouth See admin instructions. Take 2,000 mg by mouth one hour prior to dental procedures    aspirin EC 81 MG tablet Take 1 tablet (81 mg total) by mouth daily. Swallow whole. Start taking on: October 18, 2022    atorvastatin 10 MG tablet Commonly known as: LIPITOR Take 10 mg by mouth at bedtime.    calcium carbonate 500 MG chewable tablet Commonly known as: TUMS - dosed in mg elemental calcium Chew 1 tablet by mouth 3 (three) times daily as needed for indigestion or heartburn.    carvedilol 12.5 MG tablet Commonly known as: COREG Take 12.5 mg by mouth See admin instructions. Take 12.5 mg by mouth in the morning and at 5 PM    cloNIDine 0.1 MG tablet Commonly known as: CATAPRES Take 0.1 mg by mouth daily as needed (for hypertension- if systolic b/p is over 160).    denosumab 60 MG/ML Sosy injection Commonly known as: PROLIA Inject 60 mg into the skin every 6 (six) months.    furosemide 40 MG tablet Commonly known as: LASIX Take 40 mg by mouth daily as needed for edema.    hydrALAZINE 25 MG tablet Commonly known as: APRESOLINE Take 25 mg by mouth in the morning and at bedtime.    magnesium oxide 400 (240 Mg) MG tablet Commonly known as: MAG-OX Take 1 tablet (400 mg total) by mouth daily. Start taking on: October 18, 2022    pantoprazole 40 MG tablet Commonly known as: PROTONIX Take 40 mg by mouth See admin instructions. Take 40 mg by mouth in the morning and at 5 PM- BEFORE FOOD    senna-docusate 8.6-50 MG tablet Commonly known as: Senokot-S Take 1 tablet by mouth at bedtime as needed for moderate constipation.    TYLENOL 500 MG tablet Generic drug: acetaminophen Take 1,000 mg by mouth in the morning and at  bedtime. Will take 1 additional dose if needed in the afternoon    Voltaren 1 % Gel Generic drug: diclofenac Sodium Apply 2-4 g topically 4 (four) times daily as needed (to painful sites).       Past Medical History:  Diagnosis Date   CKD (chronic kidney disease), stage II    nephrologist-- dr Kathrene Bongo  [facility])   Diabetes mellitus type 2, diet-controlled (HCC)    followed by pcp---   (09-20-2019  per pt does not check blood sugar at home)   Fracture of humeral shaft, right, closed 04/25/2022   GERD (gastroesophageal reflux disease)    History of primary hyperparathyroidism    s/p  parathyroidectomy right inferior on 12-02-2012   Humerus fracture 04/25/2022   Hyperlipidemia    Hypertension    followd by nephrologist----  (09-20-2019  per pt had stress test done some time ago , unsure when/ where, thinks told ok)   OA (osteoarthritis)    Osteoporosis    Prosthetic joint infection (HCC)    followed by dr  j. hatcher (ID)   left total shoulder arthroplasty 12/ 2016  ; 12/ 2020  revision with explant joint replacement and hemiarthroplasty;  completed 6 month antibiotic 05/ 2021   Vertigo    Past Surgical History:  Procedure Laterality Date   COLONOSCOPY     DILATATION & CURETTAGE/HYSTEROSCOPY WITH MYOSURE N/A 09/28/2019   Procedure: DILATATION & CURETTAGE/HYSTEROSCOPY WITH MYOSURE;  Surgeon: Olivia Mackie, MD;  Location: [facility];  Service: Gynecology;  Laterality: N/A;   EYE SURGERY  yrs ago   laser surgery for retinal tear.  (unilateral , pt unsure which side)   ORIF HUMERUS FRACTURE Right 04/27/2022   Procedure: OPEN REDUCTION INTERNAL FIXATION (ORIF) PROXIMAL HUMERUS FRACTURE;  Surgeon: Bjorn Pippin, MD;  Location: [facility];  Service: Orthopedics;  Laterality: Right;   PARATHYROIDECTOMY N/A 12/02/2012   Procedure: PARATHYROIDECTOMY with frozen section ;  Surgeon: Velora Heckler, MD;  Location: [facility];  Service: General;  Laterality: N/A;   REVERSE  SHOULDER ARTHROPLASTY Right 04/27/2022   Procedure: REVERSE SHOULDER ARTHROPLASTY;  Surgeon: Bjorn Pippin, MD;  Location: [facility];  Service: Orthopedics;  Laterality: Right;   SHOULDER INJECTION Left 07/27/2013   Procedure: SHOULDER INJECTION;  Surgeon: Loreta Ave, MD;  Location: [facility];  Service: Orthopedics;  Laterality: Left;   STERIOD INJECTION Right 02/14/2015   Procedure: RIGHT SHOULDER CORTISONE INJECTION;  Surgeon: Loreta Ave, MD;  Location: [facility] OR;  Service: Orthopedics;  Laterality: Right;   TOTAL HIP ARTHROPLASTY Left 07/27/2013   Procedure: TOTAL HIP ARTHROPLASTY ANTERIOR APPROACH;  Surgeon: Loreta Ave, MD;  Location: [facility] OR;  Service: Orthopedics;  Laterality: Left;   TOTAL SHOULDER ARTHROPLASTY Left 02/14/2015   Procedure: LEFT TOTAL SHOULDER ARTHROPLASTY;  Surgeon: Loreta Ave, MD;  Location: [facility] OR;  Service: Orthopedics;  Laterality: Left;   TOTAL SHOULDER REVISION Left 02/09/2019   Procedure: TOTAL SHOULDER REVISION;  Surgeon: Bjorn Pippin, MD;  Location: [facility];  Service: Orthopedics;  Laterality: Left;    reports that she quit smoking about 35 years ago. Her smoking use included cigarettes. She started smoking about 55 years ago. She has a 20 pack-year smoking history. She has never used smokeless tobacco. She reports that she does not drink alcohol and does not use drugs. Social History   Socioeconomic History   Marital status: Widowed    Spouse name: Not on file   Number of children: Not on file   Years of education: Not on file   Highest education level: Not on file  Occupational History   Not on file  Tobacco Use   Smoking status: Former    Current packs/day: 0.00    Average packs/day: 1 pack/day for 20.0 years (20.0 ttl pk-yrs)    Types: Cigarettes    Start date: 10/28/1967    Quit date: 10/28/1987    Years since quitting: 35.0   Smokeless tobacco: Never  Vaping Use   Vaping status: Never Used  Substance and Sexual Activity   Alcohol use: No    Drug use: Never   Sexual activity: Yes    Birth control/protection: Post-menopausal  Other Topics Concern   Not on file  Social History Narrative   Not on file   Social Determinants of Health   Financial Resource Strain: Not on file  Food Insecurity: No Food Insecurity (10/13/2022)   Hunger Vital Sign    Worried About Running Out of Food in the Last Year: Never true    Ran Out of  Food in the Last Year: Never true  Transportation Needs: No Transportation Needs (10/13/2022)   PRAPARE - Administrator, Civil Service (Medical): No    Lack of Transportation (Non-Medical): No  Physical Activity: Not on file  Stress: Not on file  Social Connections: Not on file  Intimate Partner Violence: Not At Risk (10/13/2022)   Humiliation, Afraid, Rape, and Kick questionnaire    Fear of Current or Ex-Partner: No    Emotionally Abused: No    Physically Abused: No    Sexually Abused: No    Functional Status Survey:    Family History  Problem Relation Age of Onset   Heart failure Mother    Cancer Mother    Breast cancer Mother        in her 40s   Cancer Father     Health Maintenance  Topic Date Due   Medicare Annual Wellness (AWV)  Never done   OPHTHALMOLOGY EXAM  Never done   Diabetic kidney evaluation - Urine ACR  01/26/2014   Zoster Vaccines- Shingrix (2 of 2) 03/11/2019   COVID-19 Vaccine (4 - 2023-24 season) 11/08/2021   FOOT EXAM  10/01/2022   INFLUENZA VACCINE  10/09/2022   HEMOGLOBIN A1C  04/15/2023   Diabetic kidney evaluation - eGFR measurement  10/16/2023   DTaP/Tdap/Td (3 - Td or Tdap) 06/09/2027   Pneumonia Vaccine 45+ Years old  Completed   DEXA SCAN  Completed   HPV VACCINES  Aged Out    Allergies  Allergen Reactions   Abaloparatide Other (See Comments)    "Burred vision, lightheaded" (patient does not recall this in 2024)   Mobic [Meloxicam] Hypertension   Crestor [Rosuvastatin] Other (See Comments)    Muscle aches   Norvasc [Amlodipine Besylate]  Swelling and Other (See Comments)    Ankle swelling   Simvastatin Other (See Comments)    Muscle aches  Other Reaction(s): high LFTs   Sulfa Antibiotics Rash   Zetia [Ezetimibe] Other (See Comments)    Muscle aches    Outpatient Encounter Medications as of 10/20/2022  Medication Sig   acetaminophen (TYLENOL) 500 MG tablet Take 1,000 mg by mouth in the morning and at bedtime. Will take 1 additional dose if needed in the afternoon   allopurinol (ZYLOPRIM) 300 MG tablet Take 300 mg by mouth daily.   aspirin EC 81 MG tablet Take 1 tablet (81 mg total) by mouth daily. Swallow whole.   atorvastatin (LIPITOR) 10 MG tablet Take 10 mg by mouth at bedtime.    calcium carbonate (TUMS - DOSED IN MG ELEMENTAL CALCIUM) 500 MG chewable tablet Chew 1 tablet by mouth 3 (three) times daily as needed for indigestion or heartburn.   carvedilol (COREG) 12.5 MG tablet Take 12.5 mg by mouth See admin instructions. Take 12.5 mg by mouth in the morning and at 5 PM   cloNIDine (CATAPRES) 0.1 MG tablet Take 0.1 mg by mouth daily as needed (for hypertension- if systolic b/p is over 160).   diclofenac Sodium (VOLTAREN) 1 % GEL Apply 2-4 g topically 4 (four) times daily as needed (to painful sites).   furosemide (LASIX) 40 MG tablet Take 40 mg by mouth daily as needed for edema.   hydrALAZINE (APRESOLINE) 25 MG tablet Take 25 mg by mouth in the morning and at bedtime.   magnesium oxide (MAG-OX) 400 (240 Mg) MG tablet Take 1 tablet (400 mg total) by mouth daily.   pantoprazole (PROTONIX) 40 MG tablet Take 40 mg by mouth  See admin instructions. Take 40 mg by mouth in the morning and at 5 PM- BEFORE FOOD   senna-docusate (SENOKOT-S) 8.6-50 MG tablet Take 1 tablet by mouth at bedtime as needed for moderate constipation.   [DISCONTINUED] amoxicillin (AMOXIL) 500 MG capsule Take 2,000 mg by mouth See admin instructions. Take 2,000 mg by mouth one hour prior to dental procedures   [DISCONTINUED] denosumab (PROLIA) 60 MG/ML  SOSY injection Inject 60 mg into the skin every 6 (six) months. (Patient not taking: Reported on 10/12/2022)   No facility-administered encounter medications on file as of 10/20/2022.    Review of Systems  Constitutional:  Negative for activity change and appetite change.  HENT:  Negative for congestion, sore throat and trouble swallowing.   Eyes:  Negative for discharge.  Cardiovascular:  Negative for chest pain and leg swelling.  Gastrointestinal:  Negative for abdominal distention, abdominal pain, blood in stool and constipation.  Genitourinary:  Negative for dysuria, flank pain, frequency and hematuria.  Musculoskeletal:  Positive for arthralgias (chronic left knee pain).    Vitals:   10/20/22 0930  BP: 138/86  Pulse: 76  Resp: 18  Temp: 97.8 F (36.6 C)  SpO2: 91%  Height: 5\' 5"  (1.651 m)   Body mass index is 25.79 kg/m. Physical Exam Constitutional:      Appearance: Normal appearance.  HENT:     Head: Normocephalic and atraumatic.  Cardiovascular:     Rate and Rhythm: Regular rhythm.     Heart sounds: Normal heart sounds.  Pulmonary:     Breath sounds: Rales (basal crackles +) present.  Abdominal:     General: There is no distension.     Tenderness: There is no abdominal tenderness. There is no guarding or rebound.  Musculoskeletal:     Comments: Left knee - no erythema, no swelling  Crepitus +  Minimal pain with flexion and extension  Neurological:     Mental Status: She is alert.     Cranial Nerves: No cranial nerve deficit.     Motor: No weakness.     Labs reviewed: Basic Metabolic Panel: Recent Labs    10/13/22 0738 10/14/22 0248 10/15/22 0546 10/16/22 0043 10/16/22 2344  NA 138 137 141 133* 135  K 3.2* 3.8 3.5 3.2* 3.9  CL 108 108 109 102 103  CO2 21* 21* 20* 22 23  GLUCOSE 84 116* 126* 129* 126*  BUN 11 9 12 20 21   CREATININE 0.92 0.95 0.96 0.96 0.93  CALCIUM 10.3 9.9 10.3 10.1 10.4*  MG 1.9 1.5* 1.6* 1.9 1.7  PHOS 2.3* 3.4  --  2.6  --     Liver Function Tests: Recent Labs    10/12/22 2013 10/13/22 0738  AST 16 14*  ALT 9 10  ALKPHOS 65 56  BILITOT 1.1 1.2  PROT 5.7* 5.1*  ALBUMIN 2.9* 2.6*   Recent Labs    10/12/22 2013  LIPASE 26   Recent Labs    10/12/22 2104  AMMONIA <10   CBC: Recent Labs    10/12/22 2013 10/14/22 0248 10/15/22 0546  WBC 8.7 12.1* 11.0*  NEUTROABS 6.2  --   --   HGB 11.7* 11.3* 11.2*  HCT 36.2 34.9* 34.5*  MCV 89.6 90.6 87.6  PLT 167 225 206   Cardiac Enzymes: No results for input(s): "CKTOTAL", "CKMB", "CKMBINDEX", "TROPONINI" in the last 8760 hours. BNP: Invalid input(s): "POCBNP" Lab Results  Component Value Date   HGBA1C 6.0 (H) 10/13/2022   Lab Results  Component  Value Date   TSH 1.293 10/12/2022   No results found for: "VITAMINB12" No results found for: "FOLATE" No results found for: "IRON", "TIBC", "FERRITIN"  Imaging and Procedures obtained prior to SNF admission: ECHOCARDIOGRAM COMPLETE  Result Date: 10/13/2022    ECHOCARDIOGRAM REPORT   Patient Name:   KEVON ARMAN Date of Exam: 10/13/2022 Medical Rec #:  [id number]       Height:       65.0 in Accession #:    [id number]      Weight:       155.0 lb Date of Birth:  [birth year]       BSA:          1.775 m Patient Age:    81 years        BP:           148/52 mmHg Patient Gender: F               HR:           57 bpm. Exam Location:  Inpatient Procedure: 2D Echo, Color Doppler and Cardiac Doppler Indications:    stroke  History:        Patient has prior history of Echocardiogram examinations, most                 recent 10/06/2013. Risk Factors:Diabetes, Hypertension and                 Dyslipidemia.  Sonographer:    Delcie Roch RDCS Referring Phys: [id number] ERIC CHEN IMPRESSIONS  1. Left ventricular ejection fraction, by estimation, is 60 to 65%. The left ventricle has normal function. The left ventricle has no regional wall motion abnormalities. There is mild left ventricular hypertrophy. Left ventricular diastolic  parameters are consistent with Grade I diastolic dysfunction (impaired relaxation).  2. Right ventricular systolic function is normal. The right ventricular size is normal. There is normal pulmonary artery systolic pressure. The estimated right ventricular systolic pressure is 33.7 mmHg.  3. Left atrial size was mildly dilated.  4. The mitral valve is normal in structure. Trivial mitral valve regurgitation.  5. The aortic valve is tricuspid. Aortic valve regurgitation is not visualized. Aortic valve sclerosis/calcification is present, without any evidence of aortic stenosis.  6. The inferior vena cava is normal in size with greater than 50% respiratory variability, suggesting right atrial pressure of 3 mmHg. FINDINGS  Left Ventricle: Left ventricular ejection fraction, by estimation, is 60 to 65%. The left ventricle has normal function. The left ventricle has no regional wall motion abnormalities. The left ventricular internal cavity size was normal in size. There is  mild left ventricular hypertrophy. Left ventricular diastolic parameters are consistent with Grade I diastolic dysfunction (impaired relaxation). Right Ventricle: The right ventricular size is normal. Right ventricular systolic function is normal. There is normal pulmonary artery systolic pressure. The tricuspid regurgitant velocity is 2.77 m/s, and with an assumed right atrial pressure of 3 mmHg,  the estimated right ventricular systolic pressure is 33.7 mmHg. Left Atrium: Left atrial size was mildly dilated. Right Atrium: Right atrial size was normal in size. Pericardium: Trivial pericardial effusion is present. Mitral Valve: The mitral valve is normal in structure. Trivial mitral valve regurgitation. Tricuspid Valve: The tricuspid valve is normal in structure. Tricuspid valve regurgitation is mild. Aortic Valve: The aortic valve is tricuspid. Aortic valve regurgitation is not visualized. Aortic valve sclerosis/calcification is present, without any  evidence of aortic stenosis. Pulmonic Valve: Pulmonic valve regurgitation is not  visualized. Aorta: The aortic root and ascending aorta are structurally normal, with no evidence of dilitation. Venous: The inferior vena cava is normal in size with greater than 50% respiratory variability, suggesting right atrial pressure of 3 mmHg. IAS/Shunts: No atrial level shunt detected by color flow Doppler.  LEFT VENTRICLE PLAX 2D LVIDd:         4.20 cm   Diastology LVIDs:         2.40 cm   LV e' medial:    6.42 cm/s LV PW:         1.10 cm   LV E/e' medial:  15.2 LV IVS:        1.10 cm   LV e' lateral:   8.92 cm/s LVOT diam:     1.70 cm   LV E/e' lateral: 10.9 LV SV:         81 LV SV Index:   46 LVOT Area:     2.27 cm  RIGHT VENTRICLE RV Basal diam:  2.60 cm RV S prime:     15.60 cm/s TAPSE (M-mode): 1.9 cm LEFT ATRIUM             Index        RIGHT ATRIUM           Index LA diam:        4.30 cm 2.42 cm/m   RA Area:     10.30 cm LA Vol (A2C):   73.5 ml 41.41 ml/m  RA Volume:   18.50 ml  10.42 ml/m LA Vol (A4C):   59.9 ml 33.75 ml/m LA Biplane Vol: 66.8 ml 37.63 ml/m  AORTIC VALVE LVOT Vmax:   152.00 cm/s LVOT Vmean:  96.100 cm/s LVOT VTI:    0.356 m  AORTA Ao Root diam: 3.10 cm Ao Asc diam:  3.60 cm MITRAL VALVE                TRICUSPID VALVE MV Area (PHT): 3.51 cm     TR Peak grad:   30.7 mmHg MV Decel Time: 216 msec     TR Vmax:        277.00 cm/s MV E velocity: 97.40 cm/s MV A velocity: 106.00 cm/s  SHUNTS MV E/A ratio:  0.92         Systemic VTI:  0.36 m                             Systemic Diam: 1.70 cm Carolan Clines Electronically signed by Carolan Clines Signature Date/Time: 10/13/2022/3:34:30 PM    Final    CT ANGIO HEAD NECK W WO CM  Result Date: 10/13/2022 CLINICAL DATA:  Initial evaluation for neuro deficit, stroke. EXAM: CT ANGIOGRAPHY HEAD AND NECK WITH AND WITHOUT CONTRAST TECHNIQUE: Multidetector CT imaging of the head and neck was performed using the standard protocol during bolus administration of  intravenous contrast. Multiplanar CT image reconstructions and MIPs were obtained to evaluate the vascular anatomy. Carotid stenosis measurements (when applicable) are obtained utilizing NASCET criteria, using the distal internal carotid diameter as the denominator. RADIATION DOSE REDUCTION: This exam was performed according to the departmental dose-optimization program which includes automated exposure control, adjustment of the mA and/or kV according to patient size and/or use of iterative reconstruction technique. CONTRAST:  75mL OMNIPAQUE IOHEXOL 350 MG/ML SOLN COMPARISON:  Prior CT and MRI from 10/12/2022. FINDINGS: CTA NECK FINDINGS Aortic arch: Partially visualized aortic arch within normal limits for caliber. Bovine  branching pattern noted. Moderate atheromatous change about the arch and origin great vessels. No hemodynamically significant stenosis. Right carotid system: Right common and internal carotid arteries are tortuous but patent without dissection. Bulky calcified plaque about the right carotid bulb with associated stenosis of up to 50% by NASCET criteria. Left carotid system: Left common and internal carotid arteries are tortuous but patent without dissection. Bulky calcified plaque about the left carotid bulb without hemodynamically significant greater than 50% stenosis. Vertebral arteries: Both vertebral arteries arise from subclavian arteries. No significant proximal subclavian artery stenosis. Atheromatous plaque at the origins of both vertebral arteries with associated moderate stenoses bilaterally. Vertebral arteries otherwise patent without stenosis or dissection. Skeleton: No discrete or worrisome osseous lesions. Moderately advanced cervical spondylosis. Other neck: No other acute finding. Few scattered thyroid nodules, largest of which measures 1.5 cm on the left (series 7, image 37). Upper chest: Suspected emphysema. Superimposed scattered interlobular septal thickening consistent with  pulmonary interstitial congestion. Review of the MIP images confirms the above findings CTA HEAD FINDINGS Anterior circulation: Scattered atheromatous change about the carotid siphons without hemodynamically significant stenosis. A1 segments patent bilaterally. Normal anterior knee artery complex. Anterior cerebral arteries patent without stenosis. No M1 stenosis or occlusion. No proximal SCA branch occlusion or high-grade stenosis. Distal MCA branches perfused and symmetric. Posterior circulation: Both V4 segments patent without significant stenosis. Both PICA patent. Basilar widely patent without stenosis. Superior cerebellar and posterior cerebral arteries patent bilaterally without stenosis. Venous sinuses: Patent allowing for timing the contrast bolus. Anatomic variants: None significant.  No aneurysm. Review of the MIP images confirms the above findings IMPRESSION: 1. Negative CTA for large vessel occlusion or other emergent finding. 2. Bulky calcified plaque about the right carotid bulb with associated stenosis of up to 50% by NASCET criteria. 3. Atheromatous plaque at the origins of both vertebral arteries with associated moderate stenoses bilaterally. 4. Diffuse tortuosity of the major arterial vasculature of the head and neck, suggesting chronic underlying hypertension. 5. Mild pulmonary interstitial edema/congestion. 6. Few scattered thyroid nodules, largest of which measures 1.5 cm on the left. Further evaluation with dedicated thyroid ultrasound recommended. This could be performed on a nonemergent outpatient basis. (Ref: J Am Coll Radiol. 2015 Feb;12(2): 143-50). Aortic Atherosclerosis (ICD10-I70.0) and Emphysema (ICD10-J43.9). Electronically Signed   By: Rise Mu M.D.   On: 10/13/2022 02:26   DG Chest Portable 1 View  Result Date: 10/12/2022 CLINICAL DATA:  Speech change, worsening fatigue, decreased mobility, and cough. EXAM: PORTABLE CHEST 1 VIEW COMPARISON:  06/21/2020. FINDINGS: The  heart is enlarged and the mediastinal contour is within normal limits. There is atherosclerotic calcification of the aorta. The pulmonary vasculature is distended. There is blunting of the left costophrenic angle, possible small pleural effusion with atelectasis or infiltrate. The right lung is clear. No pneumothorax. Shoulder arthroplasty changes are noted bilaterally. IMPRESSION: 1. Cardiomegaly with mild pulmonary vascular congestion. 2. Small left pleural effusion with atelectasis or infiltrate. Electronically Signed   By: Thornell Sartorius M.D.   On: 10/12/2022 22:24   MR BRAIN WO CONTRAST  Result Date: 10/12/2022 CLINICAL DATA:  Initial evaluation for neuro deficit, stroke suspected. EXAM: MRI HEAD WITHOUT CONTRAST TECHNIQUE: Multiplanar, multiecho pulse sequences of the brain and surrounding structures were obtained without intravenous contrast. COMPARISON:  Prior CT from earlier the same day. FINDINGS: Brain: Cerebral volume within normal limits for age. Patchy T2/FLAIR hyperintensity involving the periventricular deep white matter both cerebral hemispheres, consistent with chronic small vessel ischemic disease, mild to moderate  in nature. Patchy involvement of the pons noted. Subtle punctate focus of diffusion signal abnormality seen involving the posterior limb of the right internal capsule (series 5, image 75). Associated signal loss on ADC map (series 6, image 25). Findings suspicious for a tiny acute ischemic infarct, although this could potentially be artifactual given small size. No associated hemorrhage. No other evidence for acute or subacute ischemia. Gray-white matter disease a shin otherwise maintained. No areas of chronic cortical infarction. Focus of chronic hemosiderin staining present at the right occipital lobe, consistent with prior hemorrhage at this location (series 12, image 31). No acute blood seen at this location on prior head CT. No other chronic intracranial blood products. No mass  lesion, midline shift or mass effect. No hydrocephalus or extra-axial fluid collection. Pituitary gland and suprasellar region within normal limits. Vascular: Major intracranial vascular flow voids are maintained. Skull and upper cervical spine: Craniocervical junction within normal limits. Bone marrow signal intensity normal. No scalp soft tissue abnormality. Sinuses/Orbits: Prior bilateral ocular lens replacement. Paranasal sinuses are clear. No significant mastoid effusion. Other: None. IMPRESSION: 1. Subtle punctate focus of diffusion signal abnormality involving the posterior limb of the right internal capsule. While this could potentially be artifactual given small size, a possible tiny acute ischemic infarct is difficult to exclude, and could be considered in the correct clinical setting. 2. No other acute intracranial abnormality. 3. Chronic hemosiderin staining at the right occipital lobe, consistent with prior hemorrhage at this location. 4. Underlying mild to moderate chronic microvascular ischemic disease. Electronically Signed   By: Rise Mu M.D.   On: 10/12/2022 22:21   CT HEAD WO CONTRAST ( )  Result Date: 10/12/2022 CLINICAL DATA:  Neuro deficit, acute, stroke suspected. Slurred speech, right-sided weakness EXAM: CT HEAD WITHOUT CONTRAST TECHNIQUE: Contiguous axial images were obtained from the base of the skull through the vertex without intravenous contrast. RADIATION DOSE REDUCTION: This exam was performed according to the departmental dose-optimization program which includes automated exposure control, adjustment of the mA and/or kV according to patient size and/or use of iterative reconstruction technique. COMPARISON:  [facility]24 FINDINGS: Brain: There is atrophy and chronic small vessel disease changes. No acute intracranial abnormality. Specifically, no hemorrhage, hydrocephalus, mass lesion, acute infarction, or significant intracranial injury. Vascular: No hyperdense vessel  or unexpected calcification. Skull: No acute calvarial abnormality. Sinuses/Orbits: No acute findings Other: None IMPRESSION: Atrophy, chronic microvascular disease. No acute intracranial abnormality. Electronically Signed   By: Charlett Nose M.D.   On: 10/12/2022 21:19    Assessment/Plan  Acute Encephalopathy  Secondary to CVA/ UTI Pt is alert and oriented x 3  Cont with aspirin, lipitor  Will instruct nurse to schedule appt with Stroke clinic Cont with PT/ OT   UTI  Asymptomatic  Completed abx course    Left knee OA Pt noted with mild pain in left knee with no erythema or swelling  Cont with tylenol Pt has appt  in 3 weeks for gel shot injections  Basal crackles  ECHO - EF 60 %  Pt does not appear to be in distress On room air  Not using accessory muscles for respiration  BNP (last 3 results) Recent Labs    10/14/22 0248  BNP 950.8*  Will check bnp  Low salt diet    HTN  Bp 133/ 86 , Pulse 76  Cont with hydralazine, coreg    Positive Anti CCP  Needs to follow with rheumatology out patient  No records available for review.  Total  time spent  50 min reviewing patient chart, coordinating care with nursing,  interviewing the patient and documenting.

## 2022-10-24 ENCOUNTER — Non-Acute Institutional Stay: Payer: Self-pay | Admitting: Nurse Practitioner

## 2022-10-24 ENCOUNTER — Encounter: Payer: Self-pay | Admitting: Nurse Practitioner

## 2022-10-24 DIAGNOSIS — M109 Gout, unspecified: Secondary | ICD-10-CM | POA: Diagnosis not present

## 2022-10-24 DIAGNOSIS — K219 Gastro-esophageal reflux disease without esophagitis: Secondary | ICD-10-CM

## 2022-10-24 DIAGNOSIS — R7303 Prediabetes: Secondary | ICD-10-CM

## 2022-10-24 DIAGNOSIS — I10 Essential (primary) hypertension: Secondary | ICD-10-CM | POA: Diagnosis not present

## 2022-10-24 DIAGNOSIS — R001 Bradycardia, unspecified: Secondary | ICD-10-CM

## 2022-10-24 DIAGNOSIS — E785 Hyperlipidemia, unspecified: Secondary | ICD-10-CM | POA: Diagnosis not present

## 2022-10-24 DIAGNOSIS — M1712 Unilateral primary osteoarthritis, left knee: Secondary | ICD-10-CM | POA: Diagnosis not present

## 2022-10-24 NOTE — Progress Notes (Unsigned)
Location:   SNF [facility] Room Number: 66A Place of Service:  SNF (31) Provider: Arna Snipe Arwyn Besaw NP  Sigmund Hazel, MD  Patient Care Team: Sigmund Hazel, MD as PCP - General (Family Medicine) Glyn Ade, PA-C as Physician Assistant (Dermatology)  Extended Emergency Contact Information Primary Emergency Contact: [name], [place] [address] [place] Home Phone: [phone] Work Phone: [phone] Mobile Phone: [phone] Relation: Son Secondary Emergency Contact: Dickerman,[name]  [place] Phone: [phone] Relation: Son  Code Status: DNR Goals of care: Advanced Directive information    10/20/2022    9:38 AM  Advanced Directives  Does Patient Have a Medical Advance Directive? No  Would patient like information on creating a medical advance directive? No - Patient declined     Chief Complaint  Patient presents with  . Acute Visit    R+L knee pain    HPI:  Pt is a 82 y.o. female seen today for an acute visit for R+L knee pain.   Hospitalized 10/12/22-10/17/22 UTI, encephalopathy, acute CVA  GERD, takes Pantoprazole, Hgb 11.2 10/15/22  OA R+L knee pain, f/u Ortho for Gel inj  CVA resolved difficulty word finding, on Statin, ASA, f/u stroke clinic, MRI small punctate infarct in the posteerior limb of R internal capsule, no focal weakness  Gout, stable, takes Allopurinol  HLD, on Atorvastatin, LDL 62 10/13/22  HTN, on Carvedilol, Hydralazine,  prn Clonidine, Bun/creat 21/0.93  CXR 10/14/22 pulm edema/volume overload, not apparent, prn Furosemide, pending BNP  Constipation, stable, last BM today, on prn Senokot S  Prediabetes Hgb A1c 6.0 10/13/22, TSH 1.293 10/12/22  Positive anti CCP test, f/u Rheumotology       Past Medical History:  Diagnosis Date  . CKD (chronic kidney disease), stage II    nephrologist-- dr Kathrene Bongo  [facility])  . Diabetes mellitus type 2, diet-controlled (HCC)    followed by  pcp---   (09-20-2019  per pt does not check blood sugar at home)  . Fracture of humeral shaft, right, closed 04/25/2022  . GERD (gastroesophageal reflux disease)   . History of primary hyperparathyroidism    s/p  parathyroidectomy right inferior on 12-02-2012  . Humerus fracture 04/25/2022  . Hyperlipidemia   . Hypertension    followd by nephrologist----  (09-20-2019  per pt had stress test done some time ago , unsure when/ where, thinks told ok)  . OA (osteoarthritis)   . Osteoporosis   . Prosthetic joint infection (HCC)    followed by dr j. hatcher (ID)   left total shoulder arthroplasty 12/ 2016  ; 12/ 2020  revision with explant joint replacement and hemiarthroplasty;  completed 6 month antibiotic 05/ 2021  . Vertigo    Past Surgical History:  Procedure Laterality Date  . COLONOSCOPY    . DILATATION & CURETTAGE/HYSTEROSCOPY WITH MYOSURE N/A 09/28/2019   Procedure: DILATATION & CURETTAGE/HYSTEROSCOPY WITH MYOSURE;  Surgeon: Olivia Mackie, MD;  Location: [facility];  Service: Gynecology;  Laterality: N/A;  . EYE SURGERY  yrs ago   laser surgery for retinal tear.  (unilateral , pt unsure which side)  . ORIF HUMERUS FRACTURE Right 04/27/2022   Procedure: OPEN REDUCTION INTERNAL FIXATION (ORIF) PROXIMAL HUMERUS FRACTURE;  Surgeon: Bjorn Pippin, MD;  Location: [facility];  Service: Orthopedics;  Laterality: Right;  . PARATHYROIDECTOMY N/A 12/02/2012   Procedure: PARATHYROIDECTOMY with frozen section ;  Surgeon: Velora Heckler, MD;  Location: [facility];  Service: General;  Laterality: N/A;  . REVERSE SHOULDER ARTHROPLASTY Right 04/27/2022   Procedure: REVERSE SHOULDER ARTHROPLASTY;  Surgeon: Bjorn Pippin, MD;  Location: [facility];  Service: Orthopedics;  Laterality: Right;  . SHOULDER INJECTION Left 07/27/2013   Procedure: SHOULDER INJECTION;  Surgeon: Loreta Ave, MD;  Location: [facility] OR;  Service: Orthopedics;  Laterality: Left;  . STERIOD INJECTION Right 02/14/2015   Procedure:  RIGHT SHOULDER CORTISONE INJECTION;  Surgeon: Loreta Ave, MD;  Location: [facility] OR;  Service: Orthopedics;  Laterality: Right;  . TOTAL HIP ARTHROPLASTY Left 07/27/2013   Procedure: TOTAL HIP ARTHROPLASTY ANTERIOR APPROACH;  Surgeon: Loreta Ave, MD;  Location: [facility] OR;  Service: Orthopedics;  Laterality: Left;  . TOTAL SHOULDER ARTHROPLASTY Left 02/14/2015   Procedure: LEFT TOTAL SHOULDER ARTHROPLASTY;  Surgeon: Loreta Ave, MD;  Location: [facility] OR;  Service: Orthopedics;  Laterality: Left;  . TOTAL SHOULDER REVISION Left 02/09/2019   Procedure: TOTAL SHOULDER REVISION;  Surgeon: Bjorn Pippin, MD;  Location: [facility];  Service: Orthopedics;  Laterality: Left;    Allergies  Allergen Reactions  . Abaloparatide Other (See Comments)    "Burred vision, lightheaded" (patient does not recall this in 2024)  . Mobic [Meloxicam] Hypertension  . Crestor [Rosuvastatin] Other (See Comments)    Muscle aches  . Norvasc [Amlodipine Besylate] Swelling and Other (See Comments)    Ankle swelling  . Simvastatin Other (See Comments)    Muscle aches  Other Reaction(s): high LFTs  . Sulfa Antibiotics Rash  . Zetia [Ezetimibe] Other (See Comments)    Muscle aches    Allergies as of 10/24/2022       Reactions   Abaloparatide Other (See Comments)   "Burred vision, lightheaded" (patient does not recall this in 2024)   Mobic [meloxicam] Hypertension   Crestor [rosuvastatin] Other (See Comments)   Muscle aches   Norvasc [amlodipine Besylate] Swelling, Other (See Comments)   Ankle swelling   Simvastatin Other (See Comments)   Muscle aches Other Reaction(s): high LFTs   Sulfa Antibiotics Rash   Zetia [ezetimibe] Other (See Comments)   Muscle aches        Medication List        Accurate as of October 24, 2022  4:29 PM. If you have any questions, ask your nurse or doctor.          allopurinol 300 MG tablet Commonly known as: ZYLOPRIM Take 300 mg by mouth daily.   aspirin EC 81 MG  tablet Take 1 tablet (81 mg total) by mouth daily. Swallow whole.   atorvastatin 10 MG tablet Commonly known as: LIPITOR Take 10 mg by mouth at bedtime.   calcium carbonate 500 MG chewable tablet Commonly known as: TUMS - dosed in mg elemental calcium Chew 1 tablet by mouth 3 (three) times daily as needed for indigestion or heartburn.   carvedilol 12.5 MG tablet Commonly known as: COREG Take 12.5 mg by mouth See admin instructions. Take 12.5 mg by mouth in the morning and at 5 PM   cloNIDine 0.1 MG tablet Commonly known as: CATAPRES Take 0.1 mg by mouth daily as needed (for hypertension- if systolic b/p is over 160).   furosemide 40 MG tablet Commonly known as: LASIX Take 40 mg by mouth daily as needed for edema.   hydrALAZINE 25 MG tablet Commonly known as: APRESOLINE Take 25 mg by mouth in the morning and at bedtime.   magnesium oxide 400 (240 Mg) MG tablet Commonly known  as: MAG-OX Take 1 tablet (400 mg total) by mouth daily.   pantoprazole 40 MG tablet Commonly known as: PROTONIX Take 40 mg by mouth See admin instructions. Take 40 mg by mouth in the morning and at 5 PM- BEFORE FOOD   senna-docusate 8.6-50 MG tablet Commonly known as: Senokot-S Take 1 tablet by mouth at bedtime as needed for moderate constipation.   TYLENOL 500 MG tablet Generic drug: acetaminophen Take 1,000 mg by mouth in the morning and at bedtime. Will take 1 additional dose if needed in the afternoon   Voltaren 1 % Gel Generic drug: diclofenac Sodium Apply 2-4 g topically 4 (four) times daily as needed (to painful sites).        Review of Systems  Constitutional:  Negative for appetite change, fatigue and fever.  HENT:  Negative for trouble swallowing.   Eyes:  Negative for visual disturbance.  Respiratory:  Negative for cough, shortness of breath and wheezing.   Cardiovascular:  Negative for chest pain, palpitations and leg swelling.  Gastrointestinal:  Negative for abdominal pain,  constipation, nausea and vomiting.       GERD symptoms intermittently  Genitourinary:  Negative for dysuria and urgency.       Nocturnal urination 2x average.   Musculoskeletal:  Positive for arthralgias and gait problem.       R+L knee pain  Skin:  Negative for color change.  Neurological:  Negative for speech difficulty, weakness and headaches.  Psychiatric/Behavioral:  Negative for confusion and sleep disturbance. The patient is not nervous/anxious.     Immunization History  Administered Date(s) Administered  . Influenza Split 11/24/2008  . Influenza,inj,Quad PF,6+ Mos 11/20/2010, 02/01/2013  . Influenza-Unspecified 02/19/2018  . Moderna Sars-Covid-2 Vaccination 04/23/2019, 05/21/2019, 02/20/2020  . Pneumococcal Conjugate-13 03/16/2014  . Pneumococcal Polysaccharide-23 04/28/2005  . Td 04/28/2005  . Tdap 06/08/2017  . Zoster Recombinant(Shingrix) 01/14/2019  . Zoster, Live 07/21/2007   Pertinent  Health Maintenance Due  Topic Date Due  . OPHTHALMOLOGY EXAM  Never done  . FOOT EXAM  10/01/2022  . INFLUENZA VACCINE  10/09/2022  . HEMOGLOBIN A1C  04/15/2023  . DEXA SCAN  Completed      04/12/2019    2:21 PM 07/19/2019    2:20 PM 09/28/2019    8:51 AM 06/21/2020    5:59 PM 09/23/2021    1:10 PM  Fall Risk  Falls in the past year? 0 0     (RETIRED) Patient Fall Risk Level Low fall risk Low fall risk Moderate fall risk Low fall risk Low fall risk  Fall risk Follow up Falls evaluation completed Falls evaluation completed      Functional Status Survey:    Vitals:   10/24/22 1622  BP: 121/68  Pulse: (!) 54  Resp: 16  Temp: (!) 97.3 F (36.3 C)  SpO2: 95%  Weight: 143 lb (64.9 kg)   Body mass index is 23.8 kg/m. Physical Exam Vitals and nursing note reviewed.  Constitutional:      Appearance: Normal appearance.  HENT:     Head: Normocephalic and atraumatic.     Nose: Nose normal.     Mouth/Throat:     Mouth: Mucous membranes are moist.  Eyes:     Extraocular  Movements: Extraocular movements intact.     Conjunctiva/sclera: Conjunctivae normal.     Pupils: Pupils are equal, round, and reactive to light.  Cardiovascular:     Rate and Rhythm: Normal rate and regular rhythm.     Heart sounds:  No murmur heard. Pulmonary:     Effort: Pulmonary effort is normal.     Breath sounds: Rales present. No wheezing or rhonchi.     Comments: Bibasilar rales.  Abdominal:     Palpations: Abdomen is soft.     Tenderness: There is no abdominal tenderness.  Musculoskeletal:        General: No swelling or deformity.     Cervical back: Normal range of motion and neck supple.     Right lower leg: No edema.     Left lower leg: No edema.     Comments: R+L mild pain with ROM  Skin:    General: Skin is warm and dry.  Neurological:     General: No focal deficit present.     Mental Status: She is alert and oriented to person, place, and time. Mental status is at baseline.     Cranial Nerves: No cranial nerve deficit.     Motor: No weakness.     Gait: Gait abnormal.  Psychiatric:        Mood and Affect: Mood normal.        Behavior: Behavior normal.        Thought Content: Thought content normal.        Judgment: Judgment normal.    Labs reviewed: Recent Labs    10/13/22 0738 10/14/22 0248 10/15/22 0546 10/16/22 0043 10/16/22 2344  NA 138 137 141 133* 135  K 3.2* 3.8 3.5 3.2* 3.9  CL 108 108 109 102 103  CO2 21* 21* 20* 22 23  GLUCOSE 84 116* 126* 129* 126*  BUN 11 9 12 20 21   CREATININE 0.92 0.95 0.96 0.96 0.93  CALCIUM 10.3 9.9 10.3 10.1 10.4*  MG 1.9 1.5* 1.6* 1.9 1.7  PHOS 2.3* 3.4  --  2.6  --    Recent Labs    10/12/22 2013 10/13/22 0738  AST 16 14*  ALT 9 10  ALKPHOS 65 56  BILITOT 1.1 1.2  PROT 5.7* 5.1*  ALBUMIN 2.9* 2.6*   Recent Labs    10/12/22 2013 10/14/22 0248 10/15/22 0546  WBC 8.7 12.1* 11.0*  NEUTROABS 6.2  --   --   HGB 11.7* 11.3* 11.2*  HCT 36.2 34.9* 34.5*  MCV 89.6 90.6 87.6  PLT 167 225 206   Lab  Results  Component Value Date   TSH 1.293 10/12/2022   Lab Results  Component Value Date   HGBA1C 6.0 (H) 10/13/2022   Lab Results  Component Value Date   CHOL 121 10/13/2022   HDL 44 10/13/2022   LDLCALC 62 10/13/2022   TRIG 74 10/13/2022   CHOLHDL 2.8 10/13/2022    Significant Diagnostic Results in last 30 days:  DG Chest Port 1 View  Result Date: 10/14/2022 CLINICAL DATA:  [id number] Dyspnea [id number] EXAM: PORTABLE CHEST 1 VIEW COMPARISON:  10/12/2022. FINDINGS: There is prominence of bronchovascular markings with superimposed Kerley B-lines, suggesting congestive heart failure/pulmonary edema. Redemonstration of left retrocardiac airspace opacity obscuring the lateral left hemidiaphragm, descending thoracic aorta and blunting the left lateral costophrenic angle suggesting combination of left lower lobe atelectasis and/or consolidation with pleural effusion. Bilateral costophrenic angles are clear. Stable cardio-mediastinal silhouette. Aortic arch calcifications noted. No acute osseous abnormalities. Bilateral shoulder arthroplasty noted. The soft tissues are within normal limits. IMPRESSION: 1. Findings suggestive of congestive heart failure/pulmonary edema. 2. Left lower lobe atelectasis and/or consolidation with pleural effusion. Electronically Signed   By: Jules Schick M.D.   On: 10/14/2022 08:26  ECHOCARDIOGRAM COMPLETE  Result Date: 10/13/2022    ECHOCARDIOGRAM REPORT   Patient Name:   Tina Frank Date of Exam: 10/13/2022 Medical Rec #:  [id number]       Height:       65.0 in Accession #:    [id number]      Weight:       155.0 lb Date of Birth:  07/07/1940       BSA:          1.775 m Patient Age:    81 years        BP:           148/52 mmHg Patient Gender: F               HR:           57 bpm. Exam Location:  Inpatient Procedure: 2D Echo, Color Doppler and Cardiac Doppler Indications:    stroke  History:        Patient has prior history of Echocardiogram examinations, most                  recent 10/06/2013. Risk Factors:Diabetes, Hypertension and                 Dyslipidemia.  Sonographer:    Delcie Roch RDCS Referring Phys: [id number] ERIC CHEN IMPRESSIONS  1. Left ventricular ejection fraction, by estimation, is 60 to 65%. The left ventricle has normal function. The left ventricle has no regional wall motion abnormalities. There is mild left ventricular hypertrophy. Left ventricular diastolic parameters are consistent with Grade I diastolic dysfunction (impaired relaxation).  2. Right ventricular systolic function is normal. The right ventricular size is normal. There is normal pulmonary artery systolic pressure. The estimated right ventricular systolic pressure is 33.7 mmHg.  3. Left atrial size was mildly dilated.  4. The mitral valve is normal in structure. Trivial mitral valve regurgitation.  5. The aortic valve is tricuspid. Aortic valve regurgitation is not visualized. Aortic valve sclerosis/calcification is present, without any evidence of aortic stenosis.  6. The inferior vena cava is normal in size with greater than 50% respiratory variability, suggesting right atrial pressure of 3 mmHg. FINDINGS  Left Ventricle: Left ventricular ejection fraction, by estimation, is 60 to 65%. The left ventricle has normal function. The left ventricle has no regional wall motion abnormalities. The left ventricular internal cavity size was normal in size. There is  mild left ventricular hypertrophy. Left ventricular diastolic parameters are consistent with Grade I diastolic dysfunction (impaired relaxation). Right Ventricle: The right ventricular size is normal. Right ventricular systolic function is normal. There is normal pulmonary artery systolic pressure. The tricuspid regurgitant velocity is 2.77 m/s, and with an assumed right atrial pressure of 3 mmHg,  the estimated right ventricular systolic pressure is 33.7 mmHg. Left Atrium: Left atrial size was mildly dilated. Right Atrium: Right atrial size was  normal in size. Pericardium: Trivial pericardial effusion is present. Mitral Valve: The mitral valve is normal in structure. Trivial mitral valve regurgitation. Tricuspid Valve: The tricuspid valve is normal in structure. Tricuspid valve regurgitation is mild. Aortic Valve: The aortic valve is tricuspid. Aortic valve regurgitation is not visualized. Aortic valve sclerosis/calcification is present, without any evidence of aortic stenosis. Pulmonic Valve: Pulmonic valve regurgitation is not visualized. Aorta: The aortic root and ascending aorta are structurally normal, with no evidence of dilitation. Venous: The inferior vena cava is normal in size with greater than 50% respiratory variability, suggesting right atrial pressure  of 3 mmHg. IAS/Shunts: No atrial level shunt detected by color flow Doppler.  LEFT VENTRICLE PLAX 2D LVIDd:         4.20 cm   Diastology LVIDs:         2.40 cm   LV e' medial:    6.42 cm/s LV PW:         1.10 cm   LV E/e' medial:  15.2 LV IVS:        1.10 cm   LV e' lateral:   8.92 cm/s LVOT diam:     1.70 cm   LV E/e' lateral: 10.9 LV SV:         81 LV SV Index:   46 LVOT Area:     2.27 cm  RIGHT VENTRICLE RV Basal diam:  2.60 cm RV S prime:     15.60 cm/s TAPSE (M-mode): 1.9 cm LEFT ATRIUM             Index        RIGHT ATRIUM           Index LA diam:        4.30 cm 2.42 cm/m   RA Area:     10.30 cm LA Vol (A2C):   73.5 ml 41.41 ml/m  RA Volume:   18.50 ml  10.42 ml/m LA Vol (A4C):   59.9 ml 33.75 ml/m LA Biplane Vol: 66.8 ml 37.63 ml/m  AORTIC VALVE LVOT Vmax:   152.00 cm/s LVOT Vmean:  96.100 cm/s LVOT VTI:    0.356 m  AORTA Ao Root diam: 3.10 cm Ao Asc diam:  3.60 cm MITRAL VALVE                TRICUSPID VALVE MV Area (PHT): 3.51 cm     TR Peak grad:   30.7 mmHg MV Decel Time: 216 msec     TR Vmax:        277.00 cm/s MV E velocity: 97.40 cm/s MV A velocity: 106.00 cm/s  SHUNTS MV E/A ratio:  0.92         Systemic VTI:  0.36 m                             Systemic Diam: 1.70 cm Carolan Clines Electronically signed by Carolan Clines Signature Date/Time: 10/13/2022/3:34:30 PM    Final    CT ANGIO HEAD NECK W WO CM  Result Date: 10/13/2022 CLINICAL DATA:  Initial evaluation for neuro deficit, stroke. EXAM: CT ANGIOGRAPHY HEAD AND NECK WITH AND WITHOUT CONTRAST TECHNIQUE: Multidetector CT imaging of the head and neck was performed using the standard protocol during bolus administration of intravenous contrast. Multiplanar CT image reconstructions and MIPs were obtained to evaluate the vascular anatomy. Carotid stenosis measurements (when applicable) are obtained utilizing NASCET criteria, using the distal internal carotid diameter as the denominator. RADIATION DOSE REDUCTION: This exam was performed according to the departmental dose-optimization program which includes automated exposure control, adjustment of the mA and/or kV according to patient size and/or use of iterative reconstruction technique. CONTRAST:  75mL OMNIPAQUE IOHEXOL 350 MG/ML SOLN COMPARISON:  Prior CT and MRI from 10/12/2022. FINDINGS: CTA NECK FINDINGS Aortic arch: Partially visualized aortic arch within normal limits for caliber. Bovine branching pattern noted. Moderate atheromatous change about the arch and origin great vessels. No hemodynamically significant stenosis. Right carotid system: Right common and internal carotid arteries are tortuous but patent without dissection. Bulky calcified plaque  about the right carotid bulb with associated stenosis of up to 50% by NASCET criteria. Left carotid system: Left common and internal carotid arteries are tortuous but patent without dissection. Bulky calcified plaque about the left carotid bulb without hemodynamically significant greater than 50% stenosis. Vertebral arteries: Both vertebral arteries arise from subclavian arteries. No significant proximal subclavian artery stenosis. Atheromatous plaque at the origins of both vertebral arteries with associated moderate stenoses  bilaterally. Vertebral arteries otherwise patent without stenosis or dissection. Skeleton: No discrete or worrisome osseous lesions. Moderately advanced cervical spondylosis. Other neck: No other acute finding. Few scattered thyroid nodules, largest of which measures 1.5 cm on the left (series 7, image 37). Upper chest: Suspected emphysema. Superimposed scattered interlobular septal thickening consistent with pulmonary interstitial congestion. Review of the MIP images confirms the above findings CTA HEAD FINDINGS Anterior circulation: Scattered atheromatous change about the carotid siphons without hemodynamically significant stenosis. A1 segments patent bilaterally. Normal anterior knee artery complex. Anterior cerebral arteries patent without stenosis. No M1 stenosis or occlusion. No proximal SCA branch occlusion or high-grade stenosis. Distal MCA branches perfused and symmetric. Posterior circulation: Both V4 segments patent without significant stenosis. Both PICA patent. Basilar widely patent without stenosis. Superior cerebellar and posterior cerebral arteries patent bilaterally without stenosis. Venous sinuses: Patent allowing for timing the contrast bolus. Anatomic variants: None significant.  No aneurysm. Review of the MIP images confirms the above findings IMPRESSION: 1. Negative CTA for large vessel occlusion or other emergent finding. 2. Bulky calcified plaque about the right carotid bulb with associated stenosis of up to 50% by NASCET criteria. 3. Atheromatous plaque at the origins of both vertebral arteries with associated moderate stenoses bilaterally. 4. Diffuse tortuosity of the major arterial vasculature of the head and neck, suggesting chronic underlying hypertension. 5. Mild pulmonary interstitial edema/congestion. 6. Few scattered thyroid nodules, largest of which measures 1.5 cm on the left. Further evaluation with dedicated thyroid ultrasound recommended. This could be performed on a nonemergent  outpatient basis. (Ref: J Am Coll Radiol. 2015 Feb;12(2): 143-50). Aortic Atherosclerosis (ICD10-I70.0) and Emphysema (ICD10-J43.9). Electronically Signed   By: Rise Mu M.D.   On: 10/13/2022 02:26   DG Chest Portable 1 View  Result Date: 10/12/2022 CLINICAL DATA:  Speech change, worsening fatigue, decreased mobility, and cough. EXAM: PORTABLE CHEST 1 VIEW COMPARISON:  06/21/2020. FINDINGS: The heart is enlarged and the mediastinal contour is within normal limits. There is atherosclerotic calcification of the aorta. The pulmonary vasculature is distended. There is blunting of the left costophrenic angle, possible small pleural effusion with atelectasis or infiltrate. The right lung is clear. No pneumothorax. Shoulder arthroplasty changes are noted bilaterally. IMPRESSION: 1. Cardiomegaly with mild pulmonary vascular congestion. 2. Small left pleural effusion with atelectasis or infiltrate. Electronically Signed   By: Thornell Sartorius M.D.   On: 10/12/2022 22:24   MR BRAIN WO CONTRAST  Result Date: 10/12/2022 CLINICAL DATA:  Initial evaluation for neuro deficit, stroke suspected. EXAM: MRI HEAD WITHOUT CONTRAST TECHNIQUE: Multiplanar, multiecho pulse sequences of the brain and surrounding structures were obtained without intravenous contrast. COMPARISON:  Prior CT from earlier the same day. FINDINGS: Brain: Cerebral volume within normal limits for age. Patchy T2/FLAIR hyperintensity involving the periventricular deep white matter both cerebral hemispheres, consistent with chronic small vessel ischemic disease, mild to moderate in nature. Patchy involvement of the pons noted. Subtle punctate focus of diffusion signal abnormality seen involving the posterior limb of the right internal capsule (series 5, image 75). Associated signal loss on ADC map (  series 6, image 25). Findings suspicious for a tiny acute ischemic infarct, although this could potentially be artifactual given small size. No associated  hemorrhage. No other evidence for acute or subacute ischemia. Gray-white matter disease a shin otherwise maintained. No areas of chronic cortical infarction. Focus of chronic hemosiderin staining present at the right occipital lobe, consistent with prior hemorrhage at this location (series 12, image 31). No acute blood seen at this location on prior head CT. No other chronic intracranial blood products. No mass lesion, midline shift or mass effect. No hydrocephalus or extra-axial fluid collection. Pituitary gland and suprasellar region within normal limits. Vascular: Major intracranial vascular flow voids are maintained. Skull and upper cervical spine: Craniocervical junction within normal limits. Bone marrow signal intensity normal. No scalp soft tissue abnormality. Sinuses/Orbits: Prior bilateral ocular lens replacement. Paranasal sinuses are clear. No significant mastoid effusion. Other: None. IMPRESSION: 1. Subtle punctate focus of diffusion signal abnormality involving the posterior limb of the right internal capsule. While this could potentially be artifactual given small size, a possible tiny acute ischemic infarct is difficult to exclude, and could be considered in the correct clinical setting. 2. No other acute intracranial abnormality. 3. Chronic hemosiderin staining at the right occipital lobe, consistent with prior hemorrhage at this location. 4. Underlying mild to moderate chronic microvascular ischemic disease. Electronically Signed   By: Rise Mu M.D.   On: 10/12/2022 22:21   CT HEAD WO CONTRAST ( )  Result Date: 10/12/2022 CLINICAL DATA:  Neuro deficit, acute, stroke suspected. Slurred speech, right-sided weakness EXAM: CT HEAD WITHOUT CONTRAST TECHNIQUE: Contiguous axial images were obtained from the base of the skull through the vertex without intravenous contrast. RADIATION DOSE REDUCTION: This exam was performed according to the departmental dose-optimization program which  includes automated exposure control, adjustment of the mA and/or kV according to patient size and/or use of iterative reconstruction technique. COMPARISON:  [facility]24 FINDINGS: Brain: There is atrophy and chronic small vessel disease changes. No acute intracranial abnormality. Specifically, no hemorrhage, hydrocephalus, mass lesion, acute infarction, or significant intracranial injury. Vascular: No hyperdense vessel or unexpected calcification. Skull: No acute calvarial abnormality. Sinuses/Orbits: No acute findings Other: None IMPRESSION: Atrophy, chronic microvascular disease. No acute intracranial abnormality. Electronically Signed   By: Charlett Nose M.D.   On: 10/12/2022 21:19    Assessment/Plan: Essential hypertension Blood pressure is managed, on Carvedilol, Hydralazine,  prn Clonidine, Bun/creat 21/0.93  Gastroesophageal reflux disease Stable,  takes Pantoprazole, Hgb 11.2 10/15/22  Osteoarthritis R+L knee pain, f/u Ortho for Gel inj  Hyperlipidemia on Atorvastatin, LDL 62 10/13/22  Gout stable, takes Allopurinol  Prediabetes Hgb A1c 6.0 10/13/22, TSH 1.293 10/12/22, continue diet control.     Family/ staff Communication: plan of care reviewed with the patient, the patient's son, and charge nurse.   Labs/tests ordered:  none  Time spend 35 minutes.

## 2022-10-24 NOTE — Assessment & Plan Note (Signed)
Stable,  takes Pantoprazole, Hgb 11.2 10/15/22

## 2022-10-24 NOTE — Assessment & Plan Note (Signed)
R+L knee pain, f/u Ortho for Gel inj

## 2022-10-24 NOTE — Assessment & Plan Note (Signed)
Hgb A1c 6.0 10/13/22, TSH 1.293 10/12/22, continue diet control.

## 2022-10-24 NOTE — Assessment & Plan Note (Signed)
on Atorvastatin, LDL 62 10/13/22

## 2022-10-24 NOTE — Assessment & Plan Note (Signed)
stable, takes Allopurinol.

## 2022-10-24 NOTE — Assessment & Plan Note (Signed)
Runs low, decrease Carvedilol 6.25mg /12.5mg  bid, continue Hydralazine, prn Clonidine, Bun/creat 21/0.93

## 2022-10-29 NOTE — Assessment & Plan Note (Signed)
Hx of sinus bradycardia, has been held in the past, will decrease Carvedilol to 6.25mg /12.5mg  bid in setting of low Bp

## 2022-11-04 DIAGNOSIS — M170 Bilateral primary osteoarthritis of knee: Secondary | ICD-10-CM | POA: Diagnosis not present

## 2022-11-11 DIAGNOSIS — M170 Bilateral primary osteoarthritis of knee: Secondary | ICD-10-CM | POA: Diagnosis not present

## 2022-11-13 ENCOUNTER — Ambulatory Visit: Payer: Medicare PPO | Admitting: Cardiovascular Disease

## 2022-11-18 DIAGNOSIS — M170 Bilateral primary osteoarthritis of knee: Secondary | ICD-10-CM | POA: Diagnosis not present

## 2022-11-26 ENCOUNTER — Non-Acute Institutional Stay (SKILLED_NURSING_FACILITY): Payer: Medicare PPO | Admitting: Nurse Practitioner

## 2022-11-26 ENCOUNTER — Encounter: Payer: Self-pay | Admitting: Nurse Practitioner

## 2022-11-26 DIAGNOSIS — M109 Gout, unspecified: Secondary | ICD-10-CM

## 2022-11-26 DIAGNOSIS — M25562 Pain in left knee: Secondary | ICD-10-CM

## 2022-11-26 DIAGNOSIS — R001 Bradycardia, unspecified: Secondary | ICD-10-CM

## 2022-11-26 DIAGNOSIS — R7303 Prediabetes: Secondary | ICD-10-CM

## 2022-11-26 DIAGNOSIS — I639 Cerebral infarction, unspecified: Secondary | ICD-10-CM | POA: Diagnosis not present

## 2022-11-26 DIAGNOSIS — K219 Gastro-esophageal reflux disease without esophagitis: Secondary | ICD-10-CM | POA: Diagnosis not present

## 2022-11-26 DIAGNOSIS — R768 Other specified abnormal immunological findings in serum: Secondary | ICD-10-CM

## 2022-11-26 DIAGNOSIS — K5901 Slow transit constipation: Secondary | ICD-10-CM

## 2022-11-26 DIAGNOSIS — J811 Chronic pulmonary edema: Secondary | ICD-10-CM

## 2022-11-26 DIAGNOSIS — M25561 Pain in right knee: Secondary | ICD-10-CM | POA: Diagnosis not present

## 2022-11-26 DIAGNOSIS — I10 Essential (primary) hypertension: Secondary | ICD-10-CM

## 2022-11-26 DIAGNOSIS — E785 Hyperlipidemia, unspecified: Secondary | ICD-10-CM | POA: Diagnosis not present

## 2022-11-26 NOTE — Assessment & Plan Note (Signed)
f/u Rheumotology

## 2022-11-26 NOTE — Assessment & Plan Note (Signed)
resolved difficulty word finding, on Statin, ASA, f/u stroke clinic, MRI small punctate infarct in the posteerior limb of R internal capsule, no focal weakness

## 2022-11-26 NOTE — Assessment & Plan Note (Signed)
Blood pressure is controlled, asymptomatic when BP is elevated, on Carvedilol, Hydralazine,  prn Clonidine, Bun/creat 21/0.93

## 2022-11-26 NOTE — Assessment & Plan Note (Signed)
HR still 50s, Carvedilol was decreased to 6.25mg  bid 10/29/22 2/2 to slow heart beats and low BP

## 2022-11-26 NOTE — Assessment & Plan Note (Signed)
on Atorvastatin, LDL 62 10/13/22

## 2022-11-26 NOTE — Assessment & Plan Note (Signed)
CXR 10/14/22 pulm edema/volume overload, not apparent, prn Furosemide, BNP 950.8 10/14/22, Echo 60-65% EF 10/13/22

## 2022-11-26 NOTE — Assessment & Plan Note (Signed)
Stable,  takes Pantoprazole, Hgb 11.2 10/15/22

## 2022-11-26 NOTE — Assessment & Plan Note (Signed)
stable, takes Allopurinol.

## 2022-11-26 NOTE — Progress Notes (Signed)
Location:   SNF [facility] Room Number: 66A Place of Service:  SNF (31) Provider: Arna Snipe Karthika Glasper NP  Sigmund Hazel, MD  Patient Care Team: Sigmund Hazel, MD as PCP - General (Family Medicine) Glyn Ade, PA-C as Physician Assistant (Dermatology)  Extended Emergency Contact Information Primary Emergency Contact: [name], [place] [address] [place] Home Phone: [phone] Work Phone: [phone] Mobile Phone: [phone] Relation: Son Secondary Emergency Contact: Lezama,[name]  [place] Phone: [phone] Relation: Son  Code Status:  DNR Goals of care: Advanced Directive information    10/20/2022    9:38 AM  Advanced Directives  Does Patient Have a Medical Advance Directive? No  Would patient like information on creating a medical advance directive? No - Patient declined     Chief Complaint  Patient presents with   Medical Management of Chronic Issues    HPI:  Pt is a 82 y.o. female seen today for medical management of chronic diseases.     Hospitalized 10/12/22-10/17/22 UTI, encephalopathy, acute CVA             GERD, takes Pantoprazole, Hgb 11.2 10/15/22             OA R+L knee pain, f/u Ortho for Gel inj             CVA resolved difficulty word finding, on Statin, ASA, f/u stroke clinic, MRI small punctate infarct in the posteerior limb of R internal capsule, no focal weakness             Gout, stable, takes Allopurinol             HLD, on Atorvastatin, LDL 62 10/13/22             HTN, on Carvedilol, Hydralazine,  prn Clonidine, Bun/creat 21/0.93             CXR 10/14/22 pulm edema/volume overload, not apparent, prn Furosemide, BNP 950.8 10/14/22, Echo 60-65% EF 10/13/22             Constipation, stable, on prn Senokot S             Prediabetes Hgb A1c 6.0 10/13/22, TSH 1.293 10/12/22             Positive anti CCP test, f/u Rheumatology   Bradycardia, Hx of, Carvedilol has been held in the past, HR still 50s,  Carvedilol was decreased to 6.25mg  bid 10/29/22 2/2 to slow heart beats and low BP                 Past Medical History:  Diagnosis Date   CKD (chronic kidney disease), stage II    nephrologist-- dr Kathrene Bongo  [facility])   Diabetes mellitus type 2, diet-controlled (HCC)    followed by pcp---   (09-20-2019  per pt does not check blood sugar at home)   Fracture of humeral shaft, right, closed 04/25/2022   GERD (gastroesophageal reflux disease)    History of primary hyperparathyroidism    s/p  parathyroidectomy right inferior on 12-02-2012   Humerus fracture 04/25/2022   Hyperlipidemia    Hypertension    followd by nephrologist----  (09-20-2019  per pt had stress test done some time ago , unsure when/ where, thinks told ok)   OA (osteoarthritis)    Osteoporosis    Prosthetic joint infection (HCC)    followed by dr j. hatcher (ID)   left total shoulder arthroplasty  12/ 2016  ; 12/ 2020  revision with explant joint replacement and hemiarthroplasty;  completed 6 month antibiotic 05/ 2021   Vertigo    Past Surgical History:  Procedure Laterality Date   COLONOSCOPY     DILATATION & CURETTAGE/HYSTEROSCOPY WITH MYOSURE N/A 09/28/2019   Procedure: DILATATION & CURETTAGE/HYSTEROSCOPY WITH MYOSURE;  Surgeon: Olivia Mackie, MD;  Location: [facility];  Service: Gynecology;  Laterality: N/A;   EYE SURGERY  yrs ago   laser surgery for retinal tear.  (unilateral , pt unsure which side)   ORIF HUMERUS FRACTURE Right 04/27/2022   Procedure: OPEN REDUCTION INTERNAL FIXATION (ORIF) PROXIMAL HUMERUS FRACTURE;  Surgeon: Bjorn Pippin, MD;  Location: [facility];  Service: Orthopedics;  Laterality: Right;   PARATHYROIDECTOMY N/A 12/02/2012   Procedure: PARATHYROIDECTOMY with frozen section ;  Surgeon: Velora Heckler, MD;  Location: [facility];  Service: General;  Laterality: N/A;   REVERSE SHOULDER ARTHROPLASTY Right 04/27/2022   Procedure: REVERSE SHOULDER ARTHROPLASTY;  Surgeon: Bjorn Pippin, MD;  Location: [facility];  Service: Orthopedics;  Laterality: Right;   SHOULDER INJECTION Left 07/27/2013   Procedure: SHOULDER INJECTION;  Surgeon: Loreta Ave, MD;  Location: [facility] OR;  Service: Orthopedics;  Laterality: Left;   STERIOD INJECTION Right 02/14/2015   Procedure: RIGHT SHOULDER CORTISONE INJECTION;  Surgeon: Loreta Ave, MD;  Location: [facility] OR;  Service: Orthopedics;  Laterality: Right;   TOTAL HIP ARTHROPLASTY Left 07/27/2013   Procedure: TOTAL HIP ARTHROPLASTY ANTERIOR APPROACH;  Surgeon: Loreta Ave, MD;  Location: [facility] OR;  Service: Orthopedics;  Laterality: Left;   TOTAL SHOULDER ARTHROPLASTY Left 02/14/2015   Procedure: LEFT TOTAL SHOULDER ARTHROPLASTY;  Surgeon: Loreta Ave, MD;  Location: [facility] OR;  Service: Orthopedics;  Laterality: Left;   TOTAL SHOULDER REVISION Left 02/09/2019   Procedure: TOTAL SHOULDER REVISION;  Surgeon: Bjorn Pippin, MD;  Location: [facility];  Service: Orthopedics;  Laterality: Left;    Allergies  Allergen Reactions   Abaloparatide Other (See Comments)    "Burred vision, lightheaded" (patient does not recall this in 2024)   Mobic [Meloxicam] Hypertension   Crestor [Rosuvastatin] Other (See Comments)    Muscle aches   Norvasc [Amlodipine Besylate] Swelling and Other (See Comments)    Ankle swelling   Simvastatin Other (See Comments)    Muscle aches  Other Reaction(s): high LFTs   Sulfa Antibiotics Rash   Zetia [Ezetimibe] Other (See Comments)    Muscle aches    Allergies as of 11/26/2022       Reactions   Abaloparatide Other (See Comments)   "Burred vision, lightheaded" (patient does not recall this in 2024)   Mobic [meloxicam] Hypertension   Crestor [rosuvastatin] Other (See Comments)   Muscle aches   Norvasc [amlodipine Besylate] Swelling, Other (See Comments)   Ankle swelling   Simvastatin Other (See Comments)   Muscle aches Other Reaction(s): high LFTs   Sulfa Antibiotics Rash   Zetia [ezetimibe] Other (See Comments)    Muscle aches        Medication List        Accurate as of November 26, 2022 11:59 PM. If you have any questions, ask your nurse or doctor.          allopurinol 300 MG tablet Commonly known as: ZYLOPRIM Take 300 mg by mouth daily.   aspirin EC 81 MG tablet Take 1 tablet (81 mg total) by mouth daily. Swallow whole.   atorvastatin 10 MG tablet Commonly known as:  LIPITOR Take 10 mg by mouth at bedtime.   calcium carbonate 500 MG chewable tablet Commonly known as: TUMS - dosed in mg elemental calcium Chew 1 tablet by mouth 3 (three) times daily as needed for indigestion or heartburn.   carvedilol 12.5 MG tablet Commonly known as: COREG Take 12.5 mg by mouth See admin instructions. Take 12.5 mg by mouth in the morning and at 5 PM   cloNIDine 0.1 MG tablet Commonly known as: CATAPRES Take 0.1 mg by mouth daily as needed (for hypertension- if systolic b/p is over 160).   furosemide 40 MG tablet Commonly known as: LASIX Take 40 mg by mouth daily as needed for edema.   hydrALAZINE 25 MG tablet Commonly known as: APRESOLINE Take 25 mg by mouth in the morning and at bedtime.   magnesium oxide 400 (240 Mg) MG tablet Commonly known as: MAG-OX Take 1 tablet (400 mg total) by mouth daily.   pantoprazole 40 MG tablet Commonly known as: PROTONIX Take 40 mg by mouth See admin instructions. Take 40 mg by mouth in the morning and at 5 PM- BEFORE FOOD   senna-docusate 8.6-50 MG tablet Commonly known as: Senokot-S Take 1 tablet by mouth at bedtime as needed for moderate constipation.   TYLENOL 500 MG tablet Generic drug: acetaminophen Take 1,000 mg by mouth in the morning and at bedtime. Will take 1 additional dose if needed in the afternoon   Voltaren 1 % Gel Generic drug: diclofenac Sodium Apply 2-4 g topically 4 (four) times daily as needed (to painful sites).        Review of Systems  Constitutional:  Negative for appetite change, fatigue and fever.  HENT:   Negative for trouble swallowing.   Eyes:  Negative for visual disturbance.  Respiratory:  Negative for cough and shortness of breath.   Cardiovascular:  Negative for leg swelling.  Gastrointestinal:  Negative for abdominal pain and constipation.       GERD symptoms intermittently  Genitourinary:  Negative for dysuria and urgency.       Nocturnal urination 2x average.   Musculoskeletal:  Positive for arthralgias and gait problem.       R+L knee pain  Skin:  Negative for color change.  Neurological:  Negative for speech difficulty, weakness and headaches.  Psychiatric/Behavioral:  Negative for confusion and sleep disturbance. The patient is not nervous/anxious.     Immunization History  Administered Date(s) Administered   Influenza Split 11/24/2008   Influenza,inj,Quad PF,6+ Mos 11/20/2010, 02/01/2013   Influenza-Unspecified 02/19/2018   Moderna Sars-Covid-2 Vaccination 04/23/2019, 05/21/2019, 02/20/2020   Pneumococcal Conjugate-13 03/16/2014   Pneumococcal Polysaccharide-23 04/28/2005   Td 04/28/2005   Tdap 06/08/2017   Zoster Recombinant(Shingrix) 01/14/2019   Zoster, Live 07/21/2007   Pertinent  Health Maintenance Due  Topic Date Due   OPHTHALMOLOGY EXAM  Never done   FOOT EXAM  10/01/2022   INFLUENZA VACCINE  10/09/2022   HEMOGLOBIN A1C  04/15/2023   DEXA SCAN  Completed      04/12/2019    2:21 PM 07/19/2019    2:20 PM 09/28/2019    8:51 AM 06/21/2020    5:59 PM 09/23/2021    1:10 PM  Fall Risk  Falls in the past year? 0 0     (RETIRED) Patient Fall Risk Level Low fall risk Low fall risk Moderate fall risk Low fall risk Low fall risk  Fall risk Follow up Falls evaluation completed Falls evaluation completed      Functional Status Survey:  Vitals:   11/26/22 1441  BP: (!) 120/56  Pulse: (!) 52  Resp: 17  Temp: 97.6 F (36.4 C)  SpO2: 96%   There is no height or weight on file to calculate BMI. Physical Exam Vitals and nursing note reviewed.  Constitutional:       Appearance: Normal appearance.  HENT:     Head: Normocephalic and atraumatic.     Nose: Nose normal.     Mouth/Throat:     Mouth: Mucous membranes are moist.  Eyes:     Extraocular Movements: Extraocular movements intact.     Conjunctiva/sclera: Conjunctivae normal.     Pupils: Pupils are equal, round, and reactive to light.  Cardiovascular:     Rate and Rhythm: Normal rate and regular rhythm.     Heart sounds: No murmur heard. Pulmonary:     Effort: Pulmonary effort is normal.     Breath sounds: Rales present. No wheezing or rhonchi.     Comments: Bibasilar rales.  Abdominal:     Palpations: Abdomen is soft.     Tenderness: There is no abdominal tenderness.  Musculoskeletal:     Cervical back: Normal range of motion and neck supple.     Right lower leg: No edema.     Left lower leg: No edema.     Comments: R+L mild pain with ROM  Skin:    General: Skin is warm and dry.  Neurological:     General: No focal deficit present.     Mental Status: She is alert and oriented to person, place, and time. Mental status is at baseline.     Gait: Gait abnormal.  Psychiatric:        Mood and Affect: Mood normal.        Behavior: Behavior normal.        Thought Content: Thought content normal.        Judgment: Judgment normal.     Labs reviewed: Recent Labs    10/13/22 0738 10/14/22 0248 10/15/22 0546 10/16/22 0043 10/16/22 2344  NA 138 137 141 133* 135  K 3.2* 3.8 3.5 3.2* 3.9  CL 108 108 109 102 103  CO2 21* 21* 20* 22 23  GLUCOSE 84 116* 126* 129* 126*  BUN 11 9 12 20 21   CREATININE 0.92 0.95 0.96 0.96 0.93  CALCIUM 10.3 9.9 10.3 10.1 10.4*  MG 1.9 1.5* 1.6* 1.9 1.7  PHOS 2.3* 3.4  --  2.6  --    Recent Labs    10/12/22 2013 10/13/22 0738  AST 16 14*  ALT 9 10  ALKPHOS 65 56  BILITOT 1.1 1.2  PROT 5.7* 5.1*  ALBUMIN 2.9* 2.6*   Recent Labs    10/12/22 2013 10/14/22 0248 10/15/22 0546  WBC 8.7 12.1* 11.0*  NEUTROABS 6.2  --   --   HGB 11.7* 11.3*  11.2*  HCT 36.2 34.9* 34.5*  MCV 89.6 90.6 87.6  PLT 167 225 206   Lab Results  Component Value Date   TSH 1.293 10/12/2022   Lab Results  Component Value Date   HGBA1C 6.0 (H) 10/13/2022   Lab Results  Component Value Date   CHOL 121 10/13/2022   HDL 44 10/13/2022   LDLCALC 62 10/13/2022   TRIG 74 10/13/2022   CHOLHDL 2.8 10/13/2022    Significant Diagnostic Results in last 30 days:  No results found.  Assessment/Plan  Essential hypertension Blood pressure is controlled, asymptomatic when BP is elevated, on Carvedilol, Hydralazine,  prn  Clonidine, Bun/creat 21/0.93  Acute CVA (cerebrovascular accident) (HCC) resolved difficulty word finding, on Statin, ASA, f/u stroke clinic, MRI small punctate infarct in the posteerior limb of R internal capsule, no focal weakness  Gastroesophageal reflux disease Stable,  takes Pantoprazole, Hgb 11.2 10/15/22  Joint pain R+L knee pain, f/u Ortho for Gel inj  Gout  stable, takes Allopurinol  Hyperlipidemia on Atorvastatin, LDL 62 10/13/22  Bradycardia HR still 50s, Carvedilol was decreased to 6.25mg  bid 10/29/22 2/2 to slow heart beats and low BP  Positive anti-CCP test  f/u Rheumotology   Prediabetes Hgb A1c 6.0 10/13/22, TSH 1.293 10/12/22  Slow transit constipation stable, on prn Senokot S  Pulmonary edema CXR 10/14/22 pulm edema/volume overload, not apparent, prn Furosemide, BNP 950.8 10/14/22, Echo 60-65% EF 10/13/22   Family/ staff Communication: plan of care reviewed with the patient and charge nurse.   Labs/tests ordered:  none  Time spend 35 minutes.

## 2022-11-26 NOTE — Assessment & Plan Note (Signed)
stable, on prn Senokot S

## 2022-11-26 NOTE — Assessment & Plan Note (Signed)
R+L knee pain, f/u Ortho for Gel inj

## 2022-11-26 NOTE — Assessment & Plan Note (Signed)
Hgb A1c 6.0 10/13/22, TSH 1.293 10/12/22

## 2022-12-02 ENCOUNTER — Non-Acute Institutional Stay (SKILLED_NURSING_FACILITY): Payer: Medicare PPO | Admitting: Nurse Practitioner

## 2022-12-02 ENCOUNTER — Encounter: Payer: Self-pay | Admitting: Nurse Practitioner

## 2022-12-02 DIAGNOSIS — K5901 Slow transit constipation: Secondary | ICD-10-CM

## 2022-12-02 DIAGNOSIS — M109 Gout, unspecified: Secondary | ICD-10-CM | POA: Diagnosis not present

## 2022-12-02 DIAGNOSIS — R001 Bradycardia, unspecified: Secondary | ICD-10-CM | POA: Diagnosis not present

## 2022-12-02 DIAGNOSIS — K219 Gastro-esophageal reflux disease without esophagitis: Secondary | ICD-10-CM | POA: Diagnosis not present

## 2022-12-02 DIAGNOSIS — I639 Cerebral infarction, unspecified: Secondary | ICD-10-CM | POA: Diagnosis not present

## 2022-12-02 DIAGNOSIS — I10 Essential (primary) hypertension: Secondary | ICD-10-CM | POA: Diagnosis not present

## 2022-12-02 DIAGNOSIS — M1712 Unilateral primary osteoarthritis, left knee: Secondary | ICD-10-CM | POA: Diagnosis not present

## 2022-12-02 DIAGNOSIS — E785 Hyperlipidemia, unspecified: Secondary | ICD-10-CM | POA: Diagnosis not present

## 2022-12-02 DIAGNOSIS — J811 Chronic pulmonary edema: Secondary | ICD-10-CM

## 2022-12-02 NOTE — Assessment & Plan Note (Signed)
stable, on prn Senokot S

## 2022-12-02 NOTE — Assessment & Plan Note (Signed)
Hx of, Carvedilol has been held in the past, HR still 50s, Carvedilol was decreased to 6.25mg  bid 10/29/22 2/2 to slow heart beats and low BP

## 2022-12-02 NOTE — Assessment & Plan Note (Signed)
CXR 10/14/22 pulm edema/volume overload, not apparent, prn Furosemide, BNP 950.8 10/14/22, Echo 60-65% EF 10/13/22

## 2022-12-02 NOTE — Assessment & Plan Note (Signed)
CVA resolved difficulty word finding, on Statin, ASA, f/u stroke clinic, MRI small punctate infarct in the posteerior limb of R internal capsule, no focal weakness

## 2022-12-02 NOTE — Assessment & Plan Note (Signed)
takes Pantoprazole, Hgb 11.2 10/15/22

## 2022-12-02 NOTE — Assessment & Plan Note (Signed)
R+L knee pain, f/u Ortho for Gel inj

## 2022-12-02 NOTE — Assessment & Plan Note (Signed)
on Atorvastatin, LDL 62 10/13/22

## 2022-12-02 NOTE — Assessment & Plan Note (Signed)
elevated Bp 140-190/62-98, currently taking Hydralazine 25mg  bid, Carvedilol 6.25mg  tid, prn Clonidine. Asymptomatic, the patient desires better Bp control, will increase Hydralazine to 25mg  q8hr, monitor Bp/P

## 2022-12-02 NOTE — Assessment & Plan Note (Signed)
stable, takes Allopurinol.

## 2022-12-02 NOTE — Progress Notes (Unsigned)
Location:   SNF [facility] Room Number: 66A Place of Service:  SNF (31) Provider: Arna Snipe Emili Mcloughlin NP  Sigmund Hazel, MD  Patient Care Team: Sigmund Hazel, MD as PCP - General (Family Medicine) Glyn Ade, PA-C as Physician Assistant (Dermatology)  Extended Emergency Contact Information Primary Emergency Contact: [name], [place] [address] [place] Home Phone: [phone] Work Phone: [phone] Mobile Phone: [phone] Relation: Son Secondary Emergency Contact: Preis,[name]  [place] Phone: [phone] Relation: Son  Code Status: DNR Goals of care: Advanced Directive information    10/20/2022    9:38 AM  Advanced Directives  Does Patient Have a Medical Advance Directive? No  Would patient like information on creating a medical advance directive? No - Patient declined     Chief Complaint  Patient presents with   Acute Visit    Managing blood pressure    HPI:  Pt is a 82 y.o. female seen today for an acute visit for elevated Bp 140-190/62-98, currently taking Hydralazine 25mg  bid, Carvedilol 6.25mg  tid, prn Clonidine.    Hospitalized 10/12/22-10/17/22 UTI, encephalopathy, acute CVA             GERD, takes Pantoprazole, Hgb 11.2 10/15/22             OA R+L knee pain, f/u Ortho for Gel inj             CVA resolved difficulty word finding, on Statin, ASA, f/u stroke clinic, MRI small punctate infarct in the posteerior limb of R internal capsule, no focal weakness             Gout, stable, takes Allopurinol             HLD, on Atorvastatin, LDL 62 10/13/22             HTN, on Carvedilol, Hydralazine,  prn Clonidine, Bun/creat 21/0.93             CXR 10/14/22 pulm edema/volume overload, not apparent, prn Furosemide, BNP 950.8 10/14/22, Echo 60-65% EF 10/13/22             Constipation, stable, on prn Senokot S             Prediabetes Hgb A1c 6.0 10/13/22, TSH 1.293 10/12/22             Positive anti CCP test, f/u  Rheumatology              Bradycardia, Hx of, Carvedilol has been held in the past, HR still 50s, Carvedilol was decreased to 6.25mg  bid 10/29/22 2/2 to slow heart beats and low BP                   Past Medical History:  Diagnosis Date   CKD (chronic kidney disease), stage II    nephrologist-- dr Kathrene Bongo  [facility])   Diabetes mellitus type 2, diet-controlled (HCC)    followed by pcp---   (09-20-2019  per pt does not check blood sugar at home)   Fracture of humeral shaft, right, closed 04/25/2022   GERD (gastroesophageal reflux disease)    History of primary hyperparathyroidism    s/p  parathyroidectomy right inferior on 12-02-2012   Humerus fracture 04/25/2022   Hyperlipidemia    Hypertension    followd by nephrologist----  (09-20-2019  per pt had stress test done some time ago , unsure when/ where, thinks told ok)   OA (osteoarthritis)  Osteoporosis    Prosthetic joint infection (HCC)    followed by dr j. hatcher (ID)   left total shoulder arthroplasty 12/ 2016  ; 12/ 2020  revision with explant joint replacement and hemiarthroplasty;  completed 6 month antibiotic 05/ 2021   Vertigo    Past Surgical History:  Procedure Laterality Date   COLONOSCOPY     DILATATION & CURETTAGE/HYSTEROSCOPY WITH MYOSURE N/A 09/28/2019   Procedure: DILATATION & CURETTAGE/HYSTEROSCOPY WITH MYOSURE;  Surgeon: Olivia Mackie, MD;  Location: [facility];  Service: Gynecology;  Laterality: N/A;   EYE SURGERY  yrs ago   laser surgery for retinal tear.  (unilateral , pt unsure which side)   ORIF HUMERUS FRACTURE Right 04/27/2022   Procedure: OPEN REDUCTION INTERNAL FIXATION (ORIF) PROXIMAL HUMERUS FRACTURE;  Surgeon: Bjorn Pippin, MD;  Location: [facility];  Service: Orthopedics;  Laterality: Right;   PARATHYROIDECTOMY N/A 12/02/2012   Procedure: PARATHYROIDECTOMY with frozen section ;  Surgeon: Velora Heckler, MD;  Location: [facility];  Service: General;  Laterality: N/A;   REVERSE  SHOULDER ARTHROPLASTY Right 04/27/2022   Procedure: REVERSE SHOULDER ARTHROPLASTY;  Surgeon: Bjorn Pippin, MD;  Location: [facility];  Service: Orthopedics;  Laterality: Right;   SHOULDER INJECTION Left 07/27/2013   Procedure: SHOULDER INJECTION;  Surgeon: Loreta Ave, MD;  Location: [facility] OR;  Service: Orthopedics;  Laterality: Left;   STERIOD INJECTION Right 02/14/2015   Procedure: RIGHT SHOULDER CORTISONE INJECTION;  Surgeon: Loreta Ave, MD;  Location[facility] OR;  Service: Orthopedics;  Laterality: Right;   TOTAL HIP ARTHROPLASTY Left 07/27/2013   Procedure: TOTAL HIP ARTHROPLASTY ANTERIOR APPROACH;  Surgeon: Loreta Ave, MD;  Location: [facility] OR;  Service: Orthopedics;  Laterality: Left;   TOTAL SHOULDER ARTHROPLASTY Left 02/14/2015   Procedure: LEFT TOTAL SHOULDER ARTHROPLASTY;  Surgeon: Loreta Ave, MD;  Location: [facility] OR;  Service: Orthopedics;  Laterality: Left;   TOTAL SHOULDER REVISION Left 02/09/2019   Procedure: TOTAL SHOULDER REVISION;  Surgeon: Bjorn Pippin, MD;  Location: [facility];  Service: Orthopedics;  Laterality: Left;    Allergies  Allergen Reactions   Abaloparatide Other (See Comments)    "Burred vision, lightheaded" (patient does not recall this in 2024)   Mobic [Meloxicam] Hypertension   Crestor [Rosuvastatin] Other (See Comments)    Muscle aches   Norvasc [Amlodipine Besylate] Swelling and Other (See Comments)    Ankle swelling   Simvastatin Other (See Comments)    Muscle aches  Other Reaction(s): high LFTs   Sulfa Antibiotics Rash   Zetia [Ezetimibe] Other (See Comments)    Muscle aches    Allergies as of 12/02/2022       Reactions   Abaloparatide Other (See Comments)   "Burred vision, lightheaded" (patient does not recall this in 2024)   Mobic [meloxicam] Hypertension   Crestor [rosuvastatin] Other (See Comments)   Muscle aches   Norvasc [amlodipine Besylate] Swelling, Other (See Comments)   Ankle swelling   Simvastatin Other (See Comments)   Muscle  aches Other Reaction(s): high LFTs   Sulfa Antibiotics Rash   Zetia [ezetimibe] Other (See Comments)   Muscle aches        Medication List        Accurate as of December 02, 2022 11:59 PM. If you have any questions, ask your nurse or doctor.          allopurinol 300 MG tablet Commonly known as: ZYLOPRIM Take 300 mg by mouth daily.   aspirin EC  81 MG tablet Take 1 tablet (81 mg total) by mouth daily. Swallow whole.   atorvastatin 10 MG tablet Commonly known as: LIPITOR Take 10 mg by mouth at bedtime.   calcium carbonate 500 MG chewable tablet Commonly known as: TUMS - dosed in mg elemental calcium Chew 1 tablet by mouth 3 (three) times daily as needed for indigestion or heartburn.   carvedilol 12.5 MG tablet Commonly known as: COREG Take 12.5 mg by mouth See admin instructions. Take 12.5 mg by mouth in the morning and at 5 PM   cloNIDine 0.1 MG tablet Commonly known as: CATAPRES Take 0.1 mg by mouth daily as needed (for hypertension- if systolic b/p is over 160).   furosemide 40 MG tablet Commonly known as: LASIX Take 40 mg by mouth daily as needed for edema.   hydrALAZINE 25 MG tablet Commonly known as: APRESOLINE Take 25 mg by mouth in the morning and at bedtime.   magnesium oxide 400 (240 Mg) MG tablet Commonly known as: MAG-OX Take 1 tablet (400 mg total) by mouth daily.   pantoprazole 40 MG tablet Commonly known as: PROTONIX Take 40 mg by mouth See admin instructions. Take 40 mg by mouth in the morning and at 5 PM- BEFORE FOOD   senna-docusate 8.6-50 MG tablet Commonly known as: Senokot-S Take 1 tablet by mouth at bedtime as needed for moderate constipation.   TYLENOL 500 MG tablet Generic drug: acetaminophen Take 1,000 mg by mouth in the morning and at bedtime. Will take 1 additional dose if needed in the afternoon   Voltaren 1 % Gel Generic drug: diclofenac Sodium Apply 2-4 g topically 4 (four) times daily as needed (to painful sites).         Review of Systems  Constitutional:  Negative for appetite change, fatigue and fever.  HENT:  Negative for trouble swallowing.   Eyes:  Negative for visual disturbance.  Respiratory:  Negative for cough and shortness of breath.   Cardiovascular:  Negative for leg swelling.  Gastrointestinal:  Negative for abdominal pain and constipation.       GERD symptoms intermittently  Genitourinary:  Negative for dysuria and urgency.       Nocturnal urination 2x average.   Musculoskeletal:  Positive for arthralgias and gait problem.       R+L knee pain  Skin:  Negative for color change.  Neurological:  Negative for speech difficulty, weakness and headaches.  Psychiatric/Behavioral:  Negative for confusion and sleep disturbance. The patient is not nervous/anxious.     Immunization History  Administered Date(s) Administered   Influenza Split 11/24/2008   Influenza,inj,Quad PF,6+ Mos 11/20/2010, 02/01/2013   Influenza-Unspecified 02/19/2018   Moderna Sars-Covid-2 Vaccination 04/23/2019, 05/21/2019, 02/20/2020   Pneumococcal Conjugate-13 03/16/2014   Pneumococcal Polysaccharide-23 04/28/2005   Td 04/28/2005   Tdap 06/08/2017   Zoster Recombinant(Shingrix) 01/14/2019   Zoster, Live 07/21/2007   Pertinent  Health Maintenance Due  Topic Date Due   OPHTHALMOLOGY EXAM  Never done   FOOT EXAM  10/01/2022   INFLUENZA VACCINE  10/09/2022   HEMOGLOBIN A1C  04/15/2023   DEXA SCAN  Completed      04/12/2019    2:21 PM 07/19/2019    2:20 PM 09/28/2019    8:51 AM 06/21/2020    5:59 PM 09/23/2021    1:10 PM  Fall Risk  Falls in the past year? 0 0     (RETIRED) Patient Fall Risk Level Low fall risk Low fall risk Moderate fall risk Low fall  risk Low fall risk  Fall risk Follow up Falls evaluation completed Falls evaluation completed      Functional Status Survey:    Vitals:   12/02/22 1201 12/04/22 1547  BP: (!) 150/73 (!) 149/76  Pulse: 75   Resp: 17   Temp: 97.6 F (36.4 C)   SpO2: 96%     There is no height or weight on file to calculate BMI. Physical Exam Vitals and nursing note reviewed.  Constitutional:      Appearance: Normal appearance.  HENT:     Head: Normocephalic and atraumatic.     Nose: Nose normal.     Mouth/Throat:     Mouth: Mucous membranes are moist.  Eyes:     Extraocular Movements: Extraocular movements intact.     Conjunctiva/sclera: Conjunctivae normal.     Pupils: Pupils are equal, round, and reactive to light.  Cardiovascular:     Rate and Rhythm: Normal rate and regular rhythm.     Heart sounds: No murmur heard. Pulmonary:     Effort: Pulmonary effort is normal.     Breath sounds: Rales present. No wheezing or rhonchi.     Comments: Bibasilar rales.  Abdominal:     Palpations: Abdomen is soft.     Tenderness: There is no abdominal tenderness.  Musculoskeletal:     Cervical back: Normal range of motion and neck supple.     Right lower leg: No edema.     Left lower leg: No edema.     Comments: R+L mild pain with ROM  Skin:    General: Skin is warm and dry.  Neurological:     General: No focal deficit present.     Mental Status: She is alert and oriented to person, place, and time. Mental status is at baseline.     Gait: Gait abnormal.  Psychiatric:        Mood and Affect: Mood normal.        Behavior: Behavior normal.        Thought Content: Thought content normal.        Judgment: Judgment normal.     Labs reviewed: Recent Labs    10/13/22 0738 10/14/22 0248 10/15/22 0546 10/16/22 0043 10/16/22 2344  NA 138 137 141 133* 135  K 3.2* 3.8 3.5 3.2* 3.9  CL 108 108 109 102 103  CO2 21* 21* 20* 22 23  GLUCOSE 84 116* 126* 129* 126*  BUN 11 9 12 20 21   CREATININE 0.92 0.95 0.96 0.96 0.93  CALCIUM 10.3 9.9 10.3 10.1 10.4*  MG 1.9 1.5* 1.6* 1.9 1.7  PHOS 2.3* 3.4  --  2.6  --    Recent Labs    10/12/22 2013 10/13/22 0738  AST 16 14*  ALT 9 10  ALKPHOS 65 56  BILITOT 1.1 1.2  PROT 5.7* 5.1*  ALBUMIN 2.9* 2.6*    Recent Labs    10/12/22 2013 10/14/22 0248 10/15/22 0546  WBC 8.7 12.1* 11.0*  NEUTROABS 6.2  --   --   HGB 11.7* 11.3* 11.2*  HCT 36.2 34.9* 34.5*  MCV 89.6 90.6 87.6  PLT 167 225 206   Lab Results  Component Value Date   TSH 1.293 10/12/2022   Lab Results  Component Value Date   HGBA1C 6.0 (H) 10/13/2022   Lab Results  Component Value Date   CHOL 121 10/13/2022   HDL 44 10/13/2022   LDLCALC 62 10/13/2022   TRIG 74 10/13/2022   CHOLHDL 2.8 10/13/2022  Significant Diagnostic Results in last 30 days:  No results found.  Assessment/Plan: Essential hypertension  elevated Bp 140-190/62-98, currently taking Hydralazine 25mg  bid, Carvedilol 6.25mg  tid, prn Clonidine. Asymptomatic, the patient desires better Bp control, will increase Hydralazine to 25mg  q8hr, monitor Bp/P  Gastroesophageal reflux disease  takes Pantoprazole, Hgb 11.2 10/15/22  Osteoarthritis R+L knee pain, f/u Ortho for Gel inj  Acute CVA (cerebrovascular accident) (HCC) CVA resolved difficulty word finding, on Statin, ASA, f/u stroke clinic, MRI small punctate infarct in the posteerior limb of R internal capsule, no focal weakness  Gout stable, takes Allopurinol  Hyperlipidemia on Atorvastatin, LDL 62 10/13/22  Pulmonary edema  CXR 10/14/22 pulm edema/volume overload, not apparent, prn Furosemide, BNP 950.8 10/14/22, Echo 60-65% EF 10/13/22  Slow transit constipation stable, on prn Senokot S  Bradycardia Hx of, Carvedilol has been held in the past, HR still 50s, Carvedilol was decreased to 6.25mg  bid 10/29/22 2/2 to slow heart beats and low BP    Family/ staff Communication: plan of care reviewed with the patient and charge nurse.   Labs/tests ordered:  none  Time spend 35 minutes.

## 2022-12-03 ENCOUNTER — Encounter: Payer: Self-pay | Admitting: Nurse Practitioner

## 2022-12-08 ENCOUNTER — Non-Acute Institutional Stay (SKILLED_NURSING_FACILITY): Payer: Medicare PPO | Admitting: Nurse Practitioner

## 2022-12-08 ENCOUNTER — Encounter: Payer: Self-pay | Admitting: Nurse Practitioner

## 2022-12-08 DIAGNOSIS — I10 Essential (primary) hypertension: Secondary | ICD-10-CM | POA: Diagnosis not present

## 2022-12-08 DIAGNOSIS — I639 Cerebral infarction, unspecified: Secondary | ICD-10-CM

## 2022-12-08 DIAGNOSIS — M109 Gout, unspecified: Secondary | ICD-10-CM | POA: Diagnosis not present

## 2022-12-08 DIAGNOSIS — E785 Hyperlipidemia, unspecified: Secondary | ICD-10-CM | POA: Diagnosis not present

## 2022-12-08 DIAGNOSIS — M1712 Unilateral primary osteoarthritis, left knee: Secondary | ICD-10-CM | POA: Diagnosis not present

## 2022-12-08 DIAGNOSIS — R7303 Prediabetes: Secondary | ICD-10-CM | POA: Diagnosis not present

## 2022-12-08 DIAGNOSIS — J811 Chronic pulmonary edema: Secondary | ICD-10-CM | POA: Diagnosis not present

## 2022-12-08 DIAGNOSIS — D509 Iron deficiency anemia, unspecified: Secondary | ICD-10-CM

## 2022-12-08 DIAGNOSIS — K5901 Slow transit constipation: Secondary | ICD-10-CM | POA: Diagnosis not present

## 2022-12-08 DIAGNOSIS — K219 Gastro-esophageal reflux disease without esophagitis: Secondary | ICD-10-CM

## 2022-12-08 DIAGNOSIS — R001 Bradycardia, unspecified: Secondary | ICD-10-CM

## 2022-12-08 DIAGNOSIS — E876 Hypokalemia: Secondary | ICD-10-CM

## 2022-12-08 DIAGNOSIS — D649 Anemia, unspecified: Secondary | ICD-10-CM

## 2022-12-08 NOTE — Assessment & Plan Note (Signed)
the patient reported the R shoulder pain after assistance pulled her arm last night, s/p R+L shoulder replacement, limited overhead ROM, no bruise or deformity noted. The patient seems have some memory recall difficulties.  R+L knee pain, f/u Ortho for Gel inj Obtain X-ray R+L shoulder 3 views.  12/08/22 X-ray no acute fx or dislocation, total shoulder replacement, old R humerus fx with plate and screw fixation.

## 2022-12-08 NOTE — Assessment & Plan Note (Signed)
stable, on prn Senokot S

## 2022-12-08 NOTE — Assessment & Plan Note (Signed)
Hospitalized 10/12/22-10/17/22 UTI, encephalopathy, acute CVA resolved difficulty word finding, on Statin, ASA, f/u stroke clinic, MRI small punctate infarct in the posteerior limb of R internal capsule, no focal weakness MMSE, CBC/diff, CMP/eGFR

## 2022-12-08 NOTE — Progress Notes (Signed)
Location:   [facility] Room Number: 66A Place of Service:  SNF (31) Provider: Arna Snipe Ziomara Birenbaum NP  Sigmund Hazel, MD  Patient Care Team: Sigmund Hazel, MD as PCP - General (Family Medicine) Glyn Ade, PA-C as Physician Assistant (Dermatology)  Extended Emergency Contact Information Primary Emergency Contact: [name], [place] [address] [place] of [place] Home Phone: [phone] Work Phone: [phone] Mobile Phone: [phone] Relation: Son Secondary Emergency Contact: Thurow,[name]  [place] Phone: [phone] Relation: Son  Code Status: DNR Goals of care: Advanced Directive information    10/20/2022    9:38 AM  Advanced Directives  Does Patient Have a Medical Advance Directive? No  Would patient like information on creating a medical advance directive? No - Patient declined     Chief Complaint  Patient presents with   Acute Visit    C/o R shoulder pain.     HPI:  Pt is a 82 y.o. female seen today for an acute visit for the patient reported the R shoulder pain after assistance pulled her arm last night, s/p R+L shoulder replacement, limited overhead ROM, no bruise or deformity noted. The patient seems have some memory recall difficulties.   Hospitalized 10/12/22-10/17/22 UTI, encephalopathy, acute CVA             GERD, takes Pantoprazole, Hgb 11.2 10/15/22             OA R+L knee pain, f/u Ortho for Gel inj             CVA resolved difficulty word finding, on Statin, ASA, f/u stroke clinic, MRI small punctate infarct in the posteerior limb of R internal capsule, no focal weakness             Gout, stable, takes Allopurinol             HLD, on Atorvastatin, LDL 62 10/13/22             HTN, on Carvedilol, Hydralazine,  prn Clonidine, Bun/creat 21/0.93             CXR 10/14/22 pulm edema/volume overload, not apparent, prn Furosemide, BNP 950.8 10/14/22, Echo 60-65% EF 10/13/22             Constipation, stable, on prn  Senokot S             Prediabetes Hgb A1c 6.0 10/13/22, TSH 1.293 10/12/22             Positive anti CCP test, f/u Rheumatology              Bradycardia, Hx of, Carvedilol has been held in the past, HR still 50s, Carvedilol was decreased to 6.25mg  bid 10/29/22 2/2 to slow heart beats and low BP                  Past Medical History:  Diagnosis Date   CKD (chronic kidney disease), stage II    nephrologist-- dr Kathrene Bongo  [facility])   Diabetes mellitus type 2, diet-controlled (HCC)    followed by pcp---   (09-20-2019  per pt does not check blood sugar at home)   Fracture of humeral shaft, right, closed 04/25/2022   GERD (gastroesophageal reflux disease)    History of primary hyperparathyroidism    s/p  parathyroidectomy right inferior on 12-02-2012   Humerus fracture 04/25/2022   Hyperlipidemia    Hypertension    followd by nephrologist----  (09-20-2019  per pt had stress test done some time ago , unsure when/ where, thinks told ok)   OA (osteoarthritis)    Osteoporosis    Prosthetic joint infection (HCC)    followed by dr j. hatcher ([place])   left total shoulder arthroplasty 12/ 2016  ; 12/ 2020  revision with explant joint replacement and hemiarthroplasty;  completed 6 month antibiotic 05/ 2021   Vertigo    Past Surgical History:  Procedure Laterality Date   COLONOSCOPY     DILATATION & CURETTAGE/HYSTEROSCOPY WITH MYOSURE N/A 09/28/2019   Procedure: DILATATION & CURETTAGE/HYSTEROSCOPY WITH MYOSURE;  Surgeon: Olivia Mackie, MD;  Location: [facility];  Service: Gynecology;  Laterality: N/A;   EYE SURGERY  yrs ago   laser surgery for retinal tear.  (unilateral , pt unsure which side)   ORIF HUMERUS FRACTURE Right 04/27/2022   Procedure: OPEN REDUCTION INTERNAL FIXATION (ORIF) PROXIMAL HUMERUS FRACTURE;  Surgeon: Bjorn Pippin, MD;  Location: [facility];  Service: Orthopedics;  Laterality: Right;   PARATHYROIDECTOMY N/A 12/02/2012   Procedure: PARATHYROIDECTOMY with  frozen section ;  Surgeon: Velora Heckler, MD;  Location: [facility];  Service: General;  Laterality: N/A;   REVERSE SHOULDER ARTHROPLASTY Right 04/27/2022   Procedure: REVERSE SHOULDER ARTHROPLASTY;  Surgeon: Bjorn Pippin, MD;  Location: [facility];  Service: Orthopedics;  Laterality: Right;   SHOULDER INJECTION Left 07/27/2013   Procedure: SHOULDER INJECTION;  Surgeon: Loreta Ave, MD;  Location: [facility] OR;  Service: Orthopedics;  Laterality: Left;   STERIOD INJECTION Right 02/14/2015   Procedure: RIGHT SHOULDER CORTISONE INJECTION;  Surgeon: Loreta Ave, MD;  Location: [facility] OR;  Service: Orthopedics;  Laterality: Right;   TOTAL HIP ARTHROPLASTY Left 07/27/2013   Procedure: TOTAL HIP ARTHROPLASTY ANTERIOR APPROACH;  Surgeon: Loreta Ave, MD;  Location: [facility] OR;  Service: Orthopedics;  Laterality: Left;   TOTAL SHOULDER ARTHROPLASTY Left 02/14/2015   Procedure: LEFT TOTAL SHOULDER ARTHROPLASTY;  Surgeon: Loreta Ave, MD;  Location: [facility] OR;  Service: Orthopedics;  Laterality: Left;   TOTAL SHOULDER REVISION Left 02/09/2019   Procedure: TOTAL SHOULDER REVISION;  Surgeon: Bjorn Pippin, MD;  [facility];  Service: Orthopedics;  Laterality: Left;    Allergies  Allergen Reactions   Abaloparatide Other (See Comments)    "Burred vision, lightheaded" (patient does not recall this in 2024)   Mobic [Meloxicam] Hypertension   Crestor [Rosuvastatin] Other (See Comments)    Muscle aches   Norvasc [Amlodipine Besylate] Swelling and Other (See Comments)    Ankle swelling   Simvastatin Other (See Comments)    Muscle aches  Other Reaction(s): high LFTs   Sulfa Antibiotics Rash   Zetia [Ezetimibe] Other (See Comments)    Muscle aches    Allergies as of 12/08/2022       Reactions   Abaloparatide Other (See Comments)   "Burred vision, lightheaded" (patient does not recall this in 2024)   Mobic [meloxicam] Hypertension   Crestor [rosuvastatin] Other (See Comments)   Muscle aches   Norvasc  [amlodipine Besylate] Swelling, Other (See Comments)   Ankle swelling   Simvastatin Other (See Comments)   Muscle aches Other Reaction(s): high LFTs   Sulfa Antibiotics Rash   Zetia [ezetimibe] Other (See Comments)   Muscle aches        Medication List        Accurate as of December 08, 2022  5:08 PM. If you have any questions, ask your nurse or doctor.  allopurinol 300 MG tablet Commonly known as: ZYLOPRIM Take 300 mg by mouth daily.   aspirin EC 81 MG tablet Take 1 tablet (81 mg total) by mouth daily. Swallow whole.   atorvastatin 10 MG tablet Commonly known as: LIPITOR Take 10 mg by mouth at bedtime.   calcium carbonate 500 MG chewable tablet Commonly known as: TUMS - dosed in mg elemental calcium Chew 1 tablet by mouth 3 (three) times daily as needed for indigestion or heartburn.   carvedilol 12.5 MG tablet Commonly known as: COREG Take 12.5 mg by mouth See admin instructions. Take 12.5 mg by mouth in the morning and at 5 PM   cloNIDine 0.1 MG tablet Commonly known as: CATAPRES Take 0.1 mg by mouth daily as needed (for hypertension- if systolic b/p is over 160).   furosemide 40 MG tablet Commonly known as: LASIX Take 40 mg by mouth daily as needed for edema.   hydrALAZINE 25 MG tablet Commonly known as: APRESOLINE Take 25 mg by mouth in the morning and at bedtime.   magnesium oxide 400 (240 Mg) MG tablet Commonly known as: MAG-OX Take 1 tablet (400 mg total) by mouth daily.   pantoprazole 40 MG tablet Commonly known as: PROTONIX Take 40 mg by mouth See admin instructions. Take 40 mg by mouth in the morning and at 5 PM- BEFORE FOOD   senna-docusate 8.6-50 MG tablet Commonly known as: Senokot-S Take 1 tablet by mouth at bedtime as needed for moderate constipation.   TYLENOL 500 MG tablet Generic drug: acetaminophen Take 1,000 mg by mouth in the morning and at bedtime. Will take 1 additional dose if needed in the afternoon   Voltaren 1 %  Gel Generic drug: diclofenac Sodium Apply 2-4 g topically 4 (four) times daily as needed (to painful sites).        Review of Systems  Constitutional:  Negative for appetite change, fatigue and fever.  HENT:  Negative for trouble swallowing.   Eyes:  Negative for visual disturbance.  Respiratory:  Negative for cough and shortness of breath.   Cardiovascular:  Negative for leg swelling.  Gastrointestinal:  Negative for abdominal pain and constipation.       GERD symptoms intermittently  Genitourinary:  Negative for dysuria and urgency.       Nocturnal urination 2x average.   Musculoskeletal:  Positive for arthralgias and gait problem.       R+L knee pain, R+L shoulder limited overhead ROM, c/o pain in the R shoulder, mild.   Skin:  Negative for color change.  Neurological:  Negative for speech difficulty, weakness and headaches.  Psychiatric/Behavioral:  Negative for confusion and sleep disturbance. The patient is not nervous/anxious.     Immunization History  Administered Date(s) Administered   Influenza Split 11/24/2008   Influenza,inj,Quad PF,6+ Mos 11/20/2010, 02/01/2013   Influenza-Unspecified 02/19/2018   Moderna Sars-Covid-2 Vaccination 04/23/2019, 05/21/2019, 02/20/2020   Pneumococcal Conjugate-13 03/16/2014   Pneumococcal Polysaccharide-23 04/28/2005   Td 04/28/2005   Tdap 06/08/2017   Zoster Recombinant(Shingrix) 01/14/2019   Zoster, Live 07/21/2007   Pertinent  Health Maintenance Due  Topic Date Due   OPHTHALMOLOGY EXAM  Never done   FOOT EXAM  10/01/2022   INFLUENZA VACCINE  10/09/2022   HEMOGLOBIN A1C  04/15/2023   DEXA SCAN  Completed      04/12/2019    2:21 PM 07/19/2019    2:20 PM 09/28/2019    8:51 AM 06/21/2020    5:59 PM 09/23/2021    1:10 PM  Fall Risk  Falls in the past year? 0 0     (RETIRED) Patient Fall Risk Level Low fall risk Low fall risk Moderate fall risk Low fall risk Low fall risk  Fall risk Follow up Falls evaluation completed Falls  evaluation completed      Functional Status Survey:    Vitals:   12/08/22 1653  BP: (!) 142/76  Pulse: 82  Resp: 17  Temp: 97.6 F (36.4 C)  SpO2: 96%   There is no height or weight on file to calculate BMI. Physical Exam Vitals and nursing note reviewed.  Constitutional:      Appearance: Normal appearance.  HENT:     Head: Normocephalic and atraumatic.     Nose: Nose normal.     Mouth/Throat:     Mouth: Mucous membranes are moist.  Eyes:     Extraocular Movements: Extraocular movements intact.     Conjunctiva/sclera: Conjunctivae normal.     Pupils: Pupils are equal, round, and reactive to light.  Cardiovascular:     Rate and Rhythm: Normal rate and regular rhythm.     Heart sounds: No murmur heard. Pulmonary:     Effort: Pulmonary effort is normal.     Breath sounds: Rales present. No wheezing or rhonchi.     Comments: Bibasilar rales.  Abdominal:     Palpations: Abdomen is soft.     Tenderness: There is no abdominal tenderness.  Musculoskeletal:        General: Tenderness present. No swelling or deformity.     Cervical back: Normal range of motion and neck supple.     Right lower leg: No edema.     Left lower leg: No edema.     Comments: R+L shoulder limited overhead ROM, mild R shoulder pain. S/p R+L shoulder replacement.   Skin:    General: Skin is warm and dry.  Neurological:     General: No focal deficit present.     Mental Status: She is alert and oriented to person, place, and time. Mental status is at baseline.     Gait: Gait abnormal.  Psychiatric:        Mood and Affect: Mood normal.        Behavior: Behavior normal.        Thought Content: Thought content normal.        Judgment: Judgment normal.     Labs reviewed: Recent Labs    10/13/22 0738 10/14/22 0248 10/15/22 0546 10/16/22 0043 10/16/22 2344  NA 138 137 141 133* 135  K 3.2* 3.8 3.5 3.2* 3.9  CL 108 108 109 102 103  CO2 21* 21* 20* 22 23  GLUCOSE 84 116* 126* 129* 126*  BUN 11  9 12 20 21   CREATININE 0.92 0.95 0.96 0.96 0.93  CALCIUM 10.3 9.9 10.3 10.1 10.4*  MG 1.9 1.5* 1.6* 1.9 1.7  PHOS 2.3* 3.4  --  2.6  --    Recent Labs    10/12/22 2013 10/13/22 0738  AST 16 14*  ALT 9 10  ALKPHOS 65 56  BILITOT 1.1 1.2  PROT 5.7* 5.1*  ALBUMIN 2.9* 2.6*   Recent Labs    10/12/22 2013 10/14/22 0248 10/15/22 0546  WBC 8.7 12.1* 11.0*  NEUTROABS 6.2  --   --   HGB 11.7* 11.3* 11.2*  HCT 36.2 34.9* 34.5*  MCV 89.6 90.6 87.6  PLT 167 225 206   Lab Results  Component Value Date   TSH 1.293 10/12/2022  Lab Results  Component Value Date   HGBA1C 6.0 (H) 10/13/2022   Lab Results  Component Value Date   CHOL 121 10/13/2022   HDL 44 10/13/2022   LDLCALC 62 10/13/2022   TRIG 74 10/13/2022   CHOLHDL 2.8 10/13/2022    Significant Diagnostic Results in last 30 days:  No results found.  Assessment/Plan: Osteoarthritis the patient reported the R shoulder pain after assistance pulled her arm last night, s/p R+L shoulder replacement, limited overhead ROM, no bruise or deformity noted. The patient seems have some memory recall difficulties.  R+L knee pain, f/u Ortho for Gel inj Obtain X-ray R+L shoulder 3 views.    Stroke [facility]) Hospitalized 10/12/22-10/17/22 UTI, encephalopathy, acute CVA resolved difficulty word finding, on Statin, ASA, f/u stroke clinic, MRI small punctate infarct in the posteerior limb of R internal capsule, no focal weakness MMSE, CBC/diff, CMP/eGFR  Gastroesophageal reflux disease takes Pantoprazole, Hgb 11.2 10/15/22  Gout stable, takes Allopurinol  Hyperlipidemia on Atorvastatin, LDL 62 10/13/22  Essential hypertension Blood pressure is controlled, on Carvedilol, Hydralazine,  prn Clonidine, Bun/creat 21/0.93  Pulmonary edema CXR 10/14/22 pulm edema/volume overload, not apparent, prn Furosemide, BNP 950.8 10/14/22, Echo 60-65% EF 10/13/22  Slow transit constipation stable, on prn Senokot S  Prediabetes Hgb A1c 6.0 10/13/22, TSH  1.293 10/12/22    Family/ staff Communication: plan of care reviewed with the patient, the patient's son, Child psychotherapist, and Press photographer.   Labs/tests ordered:  X-ray R+L shoulders, CBC/diff, CMP/eGFR  Time spend 35 minutes.

## 2022-12-08 NOTE — Assessment & Plan Note (Signed)
Hgb A1c 6.0 10/13/22, TSH 1.293 10/12/22

## 2022-12-08 NOTE — Assessment & Plan Note (Signed)
on Atorvastatin, LDL 62 10/13/22

## 2022-12-08 NOTE — Assessment & Plan Note (Signed)
takes Pantoprazole, Hgb 11.2 10/15/22

## 2022-12-08 NOTE — Assessment & Plan Note (Signed)
Hx of, Carvedilol has been held in the past, HR still 50s, Carvedilol was decreased to 6.25mg  bid 10/29/22 2/2 to slow heart beats and low BP

## 2022-12-08 NOTE — Assessment & Plan Note (Signed)
stable, takes Allopurinol.

## 2022-12-08 NOTE — Assessment & Plan Note (Signed)
CXR 10/14/22 pulm edema/volume overload, not apparent, prn Furosemide, BNP 950.8 10/14/22, Echo 60-65% EF 10/13/22

## 2022-12-08 NOTE — Assessment & Plan Note (Signed)
Blood pressure is controlled, on Carvedilol, Hydralazine,  prn Clonidine, Bun/creat 21/0.93

## 2022-12-09 ENCOUNTER — Encounter: Payer: Self-pay | Admitting: Nurse Practitioner

## 2022-12-09 DIAGNOSIS — D509 Iron deficiency anemia, unspecified: Secondary | ICD-10-CM | POA: Insufficient documentation

## 2022-12-09 DIAGNOSIS — D649 Anemia, unspecified: Secondary | ICD-10-CM | POA: Insufficient documentation

## 2022-12-09 DIAGNOSIS — I10 Essential (primary) hypertension: Secondary | ICD-10-CM | POA: Diagnosis not present

## 2022-12-09 NOTE — Assessment & Plan Note (Signed)
12/09/22 wbc 7.0, Hgb 9.7, plt 208, neutrophils 67.8 FOBT x3, wbc/diff, Iron, Fe Sat, Ferritin, TIBC, B12, Folate.  12/10/22 Iron 25, Fe sat 2.9, Vit B12 283, Folate 10.7, adding Vit B12 po daily,  Ferrous Sulfate 325mg  MWF

## 2022-12-09 NOTE — Assessment & Plan Note (Signed)
12/09/22 Na 139, K 2.6, Bun 13, creat 0.99, Kcl stat, repeat 9pm, then daily, serum K tomorrow.  Denied muscle weakness, palpitation, tingling/numbness, or altered mentation 12/10/22 K 2.9, continue K , repeat serum K 12/11/22

## 2022-12-09 NOTE — Progress Notes (Signed)
Location:  [facility] Room Number: N066-A Place of Service:  SNF (31) Provider:  Zakeria Kulzer X, NP    Patient Care Team: Sigmund Hazel, MD as PCP - General (Family Medicine) Glyn Ade, PA-C as Physician Assistant (Dermatology)  Extended Emergency Contact Information Primary Emergency Contact: Aide, [place], [place] [address] [place] of [place] Home Phone: [phone] Work Phone: [phone] Mobile Phone: [phone] Relation: Son Secondary Emergency Contact: Wiedemann,[name]  [place] Phone: [phone] Relation: Son  Code Status:  DNR Goals of care: Advanced Directive information    12/09/2022    8:51 AM  Advanced Directives  Does Patient Have a Medical Advance Directive? No  Would patient like information on creating a medical advance directive? No - Patient declined     Chief Complaint  Patient presents with   Acute Visit    insomnia   Error    HPI:  Pt is a 82 y.o. female seen today for an acute visit for    Past Medical History:  Diagnosis Date   CKD (chronic kidney disease), stage II    nephrologist-- dr Kathrene Bongo  [facility])   Diabetes mellitus type 2, diet-controlled (HCC)    followed by pcp---   (09-20-2019  per pt does not check blood sugar at home)   Fracture of humeral shaft, right, closed 04/25/2022   GERD (gastroesophageal reflux disease)    History of primary hyperparathyroidism    s/p  parathyroidectomy right inferior on 12-02-2012   Humerus fracture 04/25/2022   Hyperlipidemia    Hypertension    followd by nephrologist----  (09-20-2019  per pt had stress test done some time ago , unsure when/ where, thinks told ok)   OA (osteoarthritis)    Osteoporosis    Prosthetic joint infection (HCC)    followed by dr j. hatcher ([place])   left total shoulder arthroplasty 12/ 2016  ; 12/ 2020  revision with explant joint replacement and hemiarthroplasty;  completed 6 month  antibiotic 05/ 2021   Vertigo    Past Surgical History:  Procedure Laterality Date   COLONOSCOPY     DILATATION & CURETTAGE/HYSTEROSCOPY WITH MYOSURE N/A 09/28/2019   Procedure: DILATATION & CURETTAGE/HYSTEROSCOPY WITH MYOSURE;  Surgeon: Olivia Mackie, MD;  Location: [facility] [place];  Service: Gynecology;  Laterality: N/A;   EYE SURGERY  yrs ago   laser surgery for retinal tear.  (unilateral , pt unsure which side)   ORIF HUMERUS FRACTURE Right 04/27/2022   Procedure: OPEN REDUCTION INTERNAL FIXATION (ORIF) PROXIMAL HUMERUS FRACTURE;  Surgeon: Bjorn Pippin, MD;  Location[facility];  Service: Orthopedics;  Laterality: Right;   PARATHYROIDECTOMY N/A 12/02/2012   Procedure: PARATHYROIDECTOMY with frozen section ;  Surgeon: Velora Heckler, MD;  Location: [facility];  Service: General;  Laterality: N/A;   REVERSE SHOULDER ARTHROPLASTY Right 04/27/2022   Procedure: REVERSE SHOULDER ARTHROPLASTY;  Surgeon: Bjorn Pippin, MD;  Location: [facility];  Service: Orthopedics;  Laterality: Right;   SHOULDER INJECTION Left 07/27/2013   Procedure: SHOULDER INJECTION;  Surgeon: Loreta Ave, MD;  Location: [facility] OR;  Service: Orthopedics;  Laterality: Left;   STERIOD INJECTION Right 02/14/2015   Procedure: RIGHT SHOULDER CORTISONE INJECTION;  Surgeon: Loreta Ave, MD;  Location: [facility] OR;  Service: Orthopedics;  Laterality: Right;   TOTAL HIP ARTHROPLASTY Left 07/27/2013   Procedure: TOTAL HIP ARTHROPLASTY ANTERIOR APPROACH;  Surgeon: Loreta Ave, MD;  Location: [facility]  OR;  Service: Orthopedics;  Laterality: Left;   TOTAL SHOULDER ARTHROPLASTY Left 02/14/2015   Procedure: LEFT TOTAL SHOULDER ARTHROPLASTY;  Surgeon: Loreta Ave, MD;  Location: [facility] OR;  Service: Orthopedics;  Laterality: Left;   TOTAL SHOULDER REVISION Left 02/09/2019   Procedure: TOTAL SHOULDER REVISION;  Surgeon: Bjorn Pippin, MD;  Location[facility];  Service: Orthopedics;  Laterality: Left;    Allergies  Allergen Reactions    Abaloparatide Other (See Comments)    "Burred vision, lightheaded" (patient does not recall this in 2024)   Mobic [Meloxicam] Hypertension   Crestor [Rosuvastatin] Other (See Comments)    Muscle aches   Norvasc [Amlodipine Besylate] Swelling and Other (See Comments)    Ankle swelling   Simvastatin Other (See Comments)    Muscle aches  Other Reaction(s): high LFTs   Sulfa Antibiotics Rash   Zetia [Ezetimibe] Other (See Comments)    Muscle aches    Outpatient Encounter Medications as of 12/09/2022  Medication Sig   acetaminophen (TYLENOL) 500 MG tablet Take 1,000 mg by mouth in the morning and at bedtime. Will take 1 additional dose if needed in the afternoon   allopurinol (ZYLOPRIM) 300 MG tablet Take 300 mg by mouth daily.   aspirin EC 81 MG tablet Take 1 tablet (81 mg total) by mouth daily. Swallow whole.   atorvastatin (LIPITOR) 10 MG tablet Take 10 mg by mouth at bedtime.    calcium carbonate (TUMS - DOSED IN MG ELEMENTAL CALCIUM) 500 MG chewable tablet Chew 1 tablet by mouth 3 (three) times daily as needed for indigestion or heartburn.   carvedilol (COREG) 12.5 MG tablet Take 12.5 mg by mouth See admin instructions. Take 12.5 mg by mouth in the morning and at 5 PM   cloNIDine (CATAPRES) 0.1 MG tablet Take 0.1 mg by mouth daily as needed (for hypertension- if systolic b/p is over 160).   diclofenac Sodium (VOLTAREN) 1 % GEL Apply 2-4 g topically 4 (four) times daily as needed (to painful sites).   furosemide (LASIX) 40 MG tablet Take 40 mg by mouth daily as needed for edema.   hydrALAZINE (APRESOLINE) 25 MG tablet Take 25 mg by mouth in the morning and at bedtime.   magnesium oxide (MAG-OX) 400 (240 Mg) MG tablet Take 1 tablet (400 mg total) by mouth daily.   pantoprazole (PROTONIX) 40 MG tablet Take 40 mg by mouth See admin instructions. Take 40 mg by mouth in the morning and at 5 PM- BEFORE FOOD   senna-docusate (SENOKOT-S) 8.6-50 MG tablet Take 1 tablet by mouth at bedtime as  needed for moderate constipation.   No facility-administered encounter medications on file as of 12/09/2022.    Review of Systems  Immunization History  Administered Date(s) Administered   Influenza Split 11/24/2008   Influenza,inj,Quad PF,6+ Mos 11/20/2010, 02/01/2013   Influenza-Unspecified 02/19/2018   Moderna Sars-Covid-2 Vaccination 04/23/2019, 05/21/2019, 02/20/2020   Pneumococcal Conjugate-13 03/16/2014   Pneumococcal Polysaccharide-23 04/28/2005   Td 04/28/2005   Tdap 06/08/2017   Zoster Recombinant(Shingrix) 01/14/2019   Zoster, Live 07/21/2007   Pertinent  Health Maintenance Due  Topic Date Due   OPHTHALMOLOGY EXAM  Never done   FOOT EXAM  10/01/2022   INFLUENZA VACCINE  10/09/2022   HEMOGLOBIN A1C  04/15/2023   DEXA SCAN  Completed      04/12/2019    2:21 PM 07/19/2019    2:20 PM 09/28/2019    8:51 AM 06/21/2020    5:59 PM 09/23/2021    1:10  PM  Fall Risk  Falls in the past year? 0 0     (RETIRED) Patient Fall Risk Level Low fall risk Low fall risk Moderate fall risk Low fall risk Low fall risk  Fall risk Follow up Falls evaluation completed Falls evaluation completed      Functional Status Survey:    Vitals:   12/09/22 0841  BP: (!) 189/76  Pulse: 66  Resp: 17  Temp: 97.6 F (36.4 C)  SpO2: 96%  Weight: 128 lb 3.2 oz (58.2 kg)  Height: 5\' 5"  (1.651 m)   Body mass index is 21.33 kg/m. Physical Exam  Labs reviewed: Recent Labs    10/13/22 0738 10/14/22 0248 10/15/22 0546 10/16/22 0043 10/16/22 2344  NA 138 137 141 133* 135  K 3.2* 3.8 3.5 3.2* 3.9  CL 108 108 109 102 103  CO2 21* 21* 20* 22 23  GLUCOSE 84 116* 126* 129* 126*  BUN 11 9 12 20 21   CREATININE 0.92 0.95 0.96 0.96 0.93  CALCIUM 10.3 9.9 10.3 10.1 10.4*  MG 1.9 1.5* 1.6* 1.9 1.7  PHOS 2.3* 3.4  --  2.6  --    Recent Labs    10/12/22 2013 10/13/22 0738  AST 16 14*  ALT 9 10  ALKPHOS 65 56  BILITOT 1.1 1.2  PROT 5.7* 5.1*  ALBUMIN 2.9* 2.6*   Recent Labs     10/12/22 2013 10/14/22 0248 10/15/22 0546  WBC 8.7 12.1* 11.0*  NEUTROABS 6.2  --   --   HGB 11.7* 11.3* 11.2*  HCT 36.2 34.9* 34.5*  MCV 89.6 90.6 87.6  PLT 167 225 206   Lab Results  Component Value Date   TSH 1.293 10/12/2022   Lab Results  Component Value Date   HGBA1C 6.0 (H) 10/13/2022   Lab Results  Component Value Date   CHOL 121 10/13/2022   HDL 44 10/13/2022   LDLCALC 62 10/13/2022   TRIG 74 10/13/2022   CHOLHDL 2.8 10/13/2022    Significant Diagnostic Results in last 30 days:  No results found.  Assessment/Plan There are no diagnoses linked to this encounter.   Family/ staff Communication:   Labs/tests ordered:   This encounter was created in error - please disregard.

## 2022-12-10 ENCOUNTER — Encounter: Payer: Self-pay | Admitting: Nurse Practitioner

## 2022-12-10 DIAGNOSIS — E876 Hypokalemia: Secondary | ICD-10-CM | POA: Diagnosis not present

## 2022-12-10 DIAGNOSIS — R001 Bradycardia, unspecified: Secondary | ICD-10-CM | POA: Diagnosis not present

## 2022-12-10 DIAGNOSIS — E119 Type 2 diabetes mellitus without complications: Secondary | ICD-10-CM | POA: Diagnosis not present

## 2022-12-10 DIAGNOSIS — I10 Essential (primary) hypertension: Secondary | ICD-10-CM | POA: Diagnosis not present

## 2022-12-10 NOTE — Progress Notes (Signed)
This encounter was created in error - please disregard.

## 2022-12-11 DIAGNOSIS — E876 Hypokalemia: Secondary | ICD-10-CM | POA: Diagnosis not present

## 2022-12-17 ENCOUNTER — Non-Acute Institutional Stay (SKILLED_NURSING_FACILITY): Payer: Self-pay | Admitting: Adult Health

## 2022-12-17 ENCOUNTER — Encounter: Payer: Self-pay | Admitting: Adult Health

## 2022-12-17 DIAGNOSIS — Z8673 Personal history of transient ischemic attack (TIA), and cerebral infarction without residual deficits: Secondary | ICD-10-CM

## 2022-12-17 DIAGNOSIS — I10 Essential (primary) hypertension: Secondary | ICD-10-CM

## 2022-12-17 NOTE — Progress Notes (Signed)
Location:  [facility] Room Number: 66A Place of Service:  SNF (31) Provider:  Kenard Gower, DNP, FNP-BC  Patient Care Team: Sigmund Hazel, MD as PCP - General (Family Medicine) Suzi Roots as Physician Assistant (Dermatology)  Extended Emergency Contact Information Primary Emergency Contact: Ray[place], [place], [place] [address] [place] Home Phone: [phone] Work Phone: [phone] Mobile Phone: [phone] Relation: Son Secondary Emergency Contact: Pember,[name]  [place] Phone: [phone] Relation: Son  Code Status:   DNR  Goals of care: Advanced Directive information    12/09/2022    8:51 AM  Advanced Directives  Does Patient Have a Medical Advance Directive? No  Would patient like information on creating a medical advance directive? No - Patient declined     Chief Complaint  Patient presents with   Acute Visit    Sleepy/confusion    HPI:  Pt is a 82 y.o. female seen today for an acute visit regarding confusion. Social worker talked to her this morning and reported patient to be sleepy. She talked about the cable in her room went off last night because she used the red button  a lot and was worried that her room mate is going to evict her out. She was seen in the room today and noted that she would fall asleep then would wake up.   Stat labs showed elevated calcium 13.6. She had history of elevated calcium and CVA.     Past Medical History:  Diagnosis Date   CKD (chronic kidney disease), stage II    nephrologist-- dr Kathrene Bongo  [facility])   Diabetes mellitus type 2, diet-controlled (HCC)    followed by pcp---   (09-20-2019  per pt does not check blood sugar at home)   Fracture of humeral shaft, right, closed 04/25/2022   GERD (gastroesophageal reflux disease)    History of primary hyperparathyroidism    s/p  parathyroidectomy right inferior on 12-02-2012    Humerus fracture 04/25/2022   Hyperlipidemia    Hypertension    followd by nephrologist----  (09-20-2019  per pt had stress test done some time ago , unsure when/ where, thinks told ok)   OA (osteoarthritis)    Osteoporosis    Prosthetic joint infection (HCC)    followed by dr j. hatcher (ID)   left total shoulder arthroplasty 12/ 2016  ; 12/ 2020  revision with explant joint replacement and hemiarthroplasty;  completed 6 month antibiotic 05/ 2021   Vertigo    Past Surgical History:  Procedure Laterality Date   COLONOSCOPY     DILATATION & CURETTAGE/HYSTEROSCOPY WITH MYOSURE N/A 09/28/2019   Procedure: DILATATION & CURETTAGE/HYSTEROSCOPY WITH MYOSURE;  Surgeon: Olivia Mackie, MD;  Location: [facility];  Service: Gynecology;  Laterality: N/A;   EYE SURGERY  yrs ago   laser surgery for retinal tear.  (unilateral , pt unsure which side)   ORIF HUMERUS FRACTURE Right 04/27/2022   Procedure: OPEN REDUCTION INTERNAL FIXATION (ORIF) PROXIMAL HUMERUS FRACTURE;  Surgeon: Bjorn Pippin, MD;  Location: [facility];  Service: Orthopedics;  Laterality: Right;   PARATHYROIDECTOMY N/A 12/02/2012   Procedure: PARATHYROIDECTOMY with frozen section ;  Surgeon: Velora Heckler, MD;  Location: [facility];  Service: General;  Laterality: N/A;   REVERSE SHOULDER ARTHROPLASTY Right 04/27/2022   Procedure: REVERSE SHOULDER ARTHROPLASTY;  Surgeon: Bjorn Pippin, MD;  Location: [facility];  Service: Orthopedics;  Laterality:  Right;   SHOULDER INJECTION Left 07/27/2013   Procedure: SHOULDER INJECTION;  Surgeon: Loreta Ave, MD;  Location: [facility] OR;  Service: Orthopedics;  Laterality: Left;   STERIOD INJECTION Right 02/14/2015   Procedure: RIGHT SHOULDER CORTISONE INJECTION;  Surgeon: Loreta Ave, MD;  Location: [facility] OR;  Service: Orthopedics;  Laterality: Right;   TOTAL HIP ARTHROPLASTY Left 07/27/2013   Procedure: TOTAL HIP ARTHROPLASTY ANTERIOR APPROACH;  Surgeon: Loreta Ave, MD;  Location: [facility] OR;   Service: Orthopedics;  Laterality: Left;   TOTAL SHOULDER ARTHROPLASTY Left 02/14/2015   Procedure: LEFT TOTAL SHOULDER ARTHROPLASTY;  Surgeon: Loreta Ave, MD;  Location: [facility] OR;  Service: Orthopedics;  Laterality: Left;   TOTAL SHOULDER REVISION Left 02/09/2019   Procedure: TOTAL SHOULDER REVISION;  Surgeon: Bjorn Pippin, MD;  Location: [facility];  Service: Orthopedics;  Laterality: Left;    Allergies  Allergen Reactions   Abaloparatide Other (See Comments)    "Burred vision, lightheaded" (patient does not recall this in 2024)   Mobic [Meloxicam] Hypertension   Crestor [Rosuvastatin] Other (See Comments)    Muscle aches   Norvasc [Amlodipine Besylate] Swelling and Other (See Comments)    Ankle swelling   Simvastatin Other (See Comments)    Muscle aches  Other Reaction(s): high LFTs   Sulfa Antibiotics Rash   Zetia [Ezetimibe] Other (See Comments)    Muscle aches    Outpatient Encounter Medications as of 12/17/2022  Medication Sig   acetaminophen (TYLENOL) 500 MG tablet Take 1,000 mg by mouth in the morning and at bedtime. Will take 1 additional dose if needed in the afternoon   allopurinol (ZYLOPRIM) 300 MG tablet Take 300 mg by mouth daily.   aspirin EC 81 MG tablet Take 1 tablet (81 mg total) by mouth daily. Swallow whole.   atorvastatin (LIPITOR) 10 MG tablet Take 10 mg by mouth at bedtime.    calcium carbonate (TUMS - DOSED IN MG ELEMENTAL CALCIUM) 500 MG chewable tablet Chew 1 tablet by mouth 3 (three) times daily as needed for indigestion or heartburn.   carvedilol (COREG) 12.5 MG tablet Take 12.5 mg by mouth See admin instructions. Take 12.5 mg by mouth in the morning and at 5 PM   cloNIDine (CATAPRES) 0.1 MG tablet Take 0.1 mg by mouth daily as needed (for hypertension- if systolic b/p is over 160).   diclofenac Sodium (VOLTAREN) 1 % GEL Apply 2-4 g topically 4 (four) times daily as needed (to painful sites).   furosemide (LASIX) 40 MG tablet Take 40 mg by mouth daily as  needed for edema.   hydrALAZINE (APRESOLINE) 25 MG tablet Take 25 mg by mouth in the morning and at bedtime.   magnesium oxide (MAG-OX) 400 (240 Mg) MG tablet Take 1 tablet (400 mg total) by mouth daily.   pantoprazole (PROTONIX) 40 MG tablet Take 40 mg by mouth See admin instructions. Take 40 mg by mouth in the morning and at 5 PM- BEFORE FOOD   senna-docusate (SENOKOT-S) 8.6-50 MG tablet Take 1 tablet by mouth at bedtime as needed for moderate constipation.   No facility-administered encounter medications on file as of 12/17/2022.    Review of Systems  Constitutional:  Negative for appetite change, chills, fatigue and fever.  HENT:  Negative for congestion, hearing loss, rhinorrhea and sore throat.   Eyes: Negative.   Respiratory:  Negative for cough, shortness of breath and wheezing.   Cardiovascular:  Negative for chest pain, palpitations and leg swelling.  Gastrointestinal:  Negative for abdominal pain, constipation, diarrhea, nausea and vomiting.  Genitourinary:  Negative for dysuria.  Musculoskeletal:  Negative for arthralgias, back pain and myalgias.  Skin:  Negative for color change, rash and wound.  Neurological:  Negative for dizziness, weakness and headaches.  Psychiatric/Behavioral:  Positive for confusion and decreased concentration. Negative for behavioral problems. The patient is not nervous/anxious.       Immunization History  Administered Date(s) Administered   Influenza Split 11/24/2008   Influenza,inj,Quad PF,6+ Mos 11/20/2010, 02/01/2013   Influenza-Unspecified 02/19/2018   Moderna Sars-Covid-2 Vaccination 04/23/2019, 05/21/2019, 02/20/2020   Pneumococcal Conjugate-13 03/16/2014   Pneumococcal Polysaccharide-23 04/28/2005   Td 04/28/2005   Tdap 06/08/2017   Zoster Recombinant(Shingrix) 01/14/2019   Zoster, Live 07/21/2007   Pertinent  Health Maintenance Due  Topic Date Due   OPHTHALMOLOGY EXAM  Never done   FOOT EXAM  10/01/2022   INFLUENZA VACCINE   10/09/2022   HEMOGLOBIN A1C  04/15/2023   DEXA SCAN  Completed      04/12/2019    2:21 PM 07/19/2019    2:20 PM 09/28/2019    8:51 AM 06/21/2020    5:59 PM 09/23/2021    1:10 PM  Fall Risk  Falls in the past year? 0 0     (RETIRED) Patient Fall Risk Level Low fall risk Low fall risk Moderate fall risk Low fall risk Low fall risk  Fall risk Follow up Falls evaluation completed Falls evaluation completed        Vitals:   12/17/22 1648  BP: 120/74  Pulse: 64  Resp: 18  Temp: (!) 97.3 F (36.3 C)  SpO2: 95%  Weight: 137 lb 12.8 oz (62.5 kg)  Height: 5\' 5"  (1.651 m)   Body mass index is 22.93 kg/m.  Physical Exam Constitutional:      General: She is not in acute distress.    Appearance: Normal appearance.  HENT:     Head: Normocephalic and atraumatic.     Nose: Nose normal.     Mouth/Throat:     Mouth: Mucous membranes are moist.  Eyes:     Conjunctiva/sclera: Conjunctivae normal.  Cardiovascular:     Rate and Rhythm: Normal rate and regular rhythm.  Pulmonary:     Effort: Pulmonary effort is normal.     Breath sounds: Normal breath sounds.  Abdominal:     General: Bowel sounds are normal.     Palpations: Abdomen is soft.  Musculoskeletal:        General: Normal range of motion.     Cervical back: Normal range of motion.  Skin:    General: Skin is warm and dry.  Psychiatric:        Mood and Affect: Mood normal.        Behavior: Behavior normal.      Labs reviewed: Recent Labs    10/13/22 0738 10/14/22 0248 10/15/22 0546 10/16/22 0043 10/16/22 2344  NA 138 137 141 133* 135  K 3.2* 3.8 3.5 3.2* 3.9  CL 108 108 109 102 103  CO2 21* 21* 20* 22 23  GLUCOSE 84 116* 126* 129* 126*  BUN 11 9 12 20 21   CREATININE 0.92 0.95 0.96 0.96 0.93  CALCIUM 10.3 9.9 10.3 10.1 10.4*  MG 1.9 1.5* 1.6* 1.9 1.7  PHOS 2.3* 3.4  --  2.6  --    Recent Labs    10/12/22 2013 10/13/22 0738  AST 16 14*  ALT 9 10  ALKPHOS 65 56  BILITOT 1.1 1.2  PROT 5.7* 5.1*  ALBUMIN  2.9* 2.6*   Recent Labs    10/12/22 2013 10/14/22 0248 10/15/22 0546  WBC 8.7 12.1* 11.0*  NEUTROABS 6.2  --   --   HGB 11.7* 11.3* 11.2*  HCT 36.2 34.9* 34.5*  MCV 89.6 90.6 87.6  PLT 167 225 206   Lab Results  Component Value Date   TSH 1.293 10/12/2022   Lab Results  Component Value Date   HGBA1C 6.0 (H) 10/13/2022   Lab Results  Component Value Date   CHOL 121 10/13/2022   HDL 44 10/13/2022   LDLCALC 62 10/13/2022   TRIG 74 10/13/2022   CHOLHDL 2.8 10/13/2022    Significant Diagnostic Results in last 30 days:  No results found.  Assessment/Plan  1. Hypercalcemia -  calcium 13.6 -  possibly causing the confusion -  will recheck total calcium and intact PTH  2. History of CVA (cerebrovascular accident) -  continue  ASA and Lipitor -  follow up with neurology  3. Essential hypertension -  BP stable -  continue Clonidine, Hydralazine and Carvedilol     Family/ staff Communication: Discussed plan of care with resident and charge nurse.  Labs/tests ordered: UA with CS, CBC and BMP    Kenard Gower, DNP, MSN, FNP-BC [facility] [phone] (Monday-Friday 8:00 a.m. - 5:00 p.m.) [phone] (after hours)

## 2022-12-22 ENCOUNTER — Emergency Department (HOSPITAL_COMMUNITY): Payer: Medicare PPO

## 2022-12-22 ENCOUNTER — Encounter (HOSPITAL_COMMUNITY): Payer: Self-pay | Admitting: Family Medicine

## 2022-12-22 ENCOUNTER — Encounter (HOSPITAL_COMMUNITY): Payer: Self-pay

## 2022-12-22 ENCOUNTER — Encounter: Payer: Self-pay | Admitting: Orthopedic Surgery

## 2022-12-22 ENCOUNTER — Inpatient Hospital Stay (HOSPITAL_COMMUNITY)
Admission: EM | Admit: 2022-12-22 | Discharge: 2023-01-08 | DRG: 689 | Disposition: A | Discharge status: Skilled Nursing Facility | Payer: Medicare PPO | Source: Skilled Nursing Facility | Attending: Internal Medicine | Admitting: Internal Medicine

## 2022-12-22 ENCOUNTER — Other Ambulatory Visit: Payer: Self-pay

## 2022-12-22 ENCOUNTER — Non-Acute Institutional Stay (SKILLED_NURSING_FACILITY): Payer: Self-pay | Admitting: Orthopedic Surgery

## 2022-12-22 DIAGNOSIS — E1122 Type 2 diabetes mellitus with diabetic chronic kidney disease: Secondary | ICD-10-CM | POA: Diagnosis present

## 2022-12-22 DIAGNOSIS — I2510 Atherosclerotic heart disease of native coronary artery without angina pectoris: Secondary | ICD-10-CM | POA: Diagnosis not present

## 2022-12-22 DIAGNOSIS — G9340 Encephalopathy, unspecified: Secondary | ICD-10-CM | POA: Diagnosis present

## 2022-12-22 DIAGNOSIS — Z8249 Family history of ischemic heart disease and other diseases of the circulatory system: Secondary | ICD-10-CM

## 2022-12-22 DIAGNOSIS — N136 Pyonephrosis: Secondary | ICD-10-CM | POA: Diagnosis not present

## 2022-12-22 DIAGNOSIS — D472 Monoclonal gammopathy: Secondary | ICD-10-CM | POA: Diagnosis not present

## 2022-12-22 DIAGNOSIS — I509 Heart failure, unspecified: Secondary | ICD-10-CM | POA: Diagnosis not present

## 2022-12-22 DIAGNOSIS — A419 Sepsis, unspecified organism: Secondary | ICD-10-CM | POA: Diagnosis not present

## 2022-12-22 DIAGNOSIS — I69391 Dysphagia following cerebral infarction: Secondary | ICD-10-CM | POA: Diagnosis not present

## 2022-12-22 DIAGNOSIS — N3000 Acute cystitis without hematuria: Secondary | ICD-10-CM | POA: Diagnosis not present

## 2022-12-22 DIAGNOSIS — F0390 Unspecified dementia without behavioral disturbance: Secondary | ICD-10-CM | POA: Diagnosis present

## 2022-12-22 DIAGNOSIS — E785 Hyperlipidemia, unspecified: Secondary | ICD-10-CM | POA: Diagnosis present

## 2022-12-22 DIAGNOSIS — Z96642 Presence of left artificial hip joint: Secondary | ICD-10-CM | POA: Diagnosis present

## 2022-12-22 DIAGNOSIS — Z471 Aftercare following joint replacement surgery: Secondary | ICD-10-CM | POA: Diagnosis not present

## 2022-12-22 DIAGNOSIS — D7589 Other specified diseases of blood and blood-forming organs: Secondary | ICD-10-CM | POA: Diagnosis not present

## 2022-12-22 DIAGNOSIS — M109 Gout, unspecified: Secondary | ICD-10-CM | POA: Diagnosis not present

## 2022-12-22 DIAGNOSIS — D631 Anemia in chronic kidney disease: Secondary | ICD-10-CM | POA: Diagnosis not present

## 2022-12-22 DIAGNOSIS — N2889 Other specified disorders of kidney and ureter: Secondary | ICD-10-CM

## 2022-12-22 DIAGNOSIS — I5032 Chronic diastolic (congestive) heart failure: Secondary | ICD-10-CM | POA: Diagnosis not present

## 2022-12-22 DIAGNOSIS — K219 Gastro-esophageal reflux disease without esophagitis: Secondary | ICD-10-CM | POA: Diagnosis present

## 2022-12-22 DIAGNOSIS — Z7901 Long term (current) use of anticoagulants: Secondary | ICD-10-CM | POA: Diagnosis not present

## 2022-12-22 DIAGNOSIS — N390 Urinary tract infection, site not specified: Secondary | ICD-10-CM | POA: Diagnosis present

## 2022-12-22 DIAGNOSIS — N179 Acute kidney failure, unspecified: Principal | ICD-10-CM | POA: Diagnosis present

## 2022-12-22 DIAGNOSIS — Z96612 Presence of left artificial shoulder joint: Secondary | ICD-10-CM | POA: Diagnosis not present

## 2022-12-22 DIAGNOSIS — R410 Disorientation, unspecified: Secondary | ICD-10-CM | POA: Diagnosis not present

## 2022-12-22 DIAGNOSIS — R001 Bradycardia, unspecified: Secondary | ICD-10-CM | POA: Diagnosis not present

## 2022-12-22 DIAGNOSIS — R4182 Altered mental status, unspecified: Secondary | ICD-10-CM | POA: Diagnosis not present

## 2022-12-22 DIAGNOSIS — Z882 Allergy status to sulfonamides status: Secondary | ICD-10-CM

## 2022-12-22 DIAGNOSIS — I10 Essential (primary) hypertension: Secondary | ICD-10-CM | POA: Diagnosis not present

## 2022-12-22 DIAGNOSIS — E872 Acidosis, unspecified: Secondary | ICD-10-CM | POA: Diagnosis present

## 2022-12-22 DIAGNOSIS — I4891 Unspecified atrial fibrillation: Secondary | ICD-10-CM | POA: Diagnosis not present

## 2022-12-22 DIAGNOSIS — Z7401 Bed confinement status: Secondary | ICD-10-CM | POA: Diagnosis not present

## 2022-12-22 DIAGNOSIS — R2681 Unsteadiness on feet: Secondary | ICD-10-CM | POA: Diagnosis not present

## 2022-12-22 DIAGNOSIS — R000 Tachycardia, unspecified: Secondary | ICD-10-CM | POA: Diagnosis not present

## 2022-12-22 DIAGNOSIS — K5900 Constipation, unspecified: Secondary | ICD-10-CM | POA: Diagnosis not present

## 2022-12-22 DIAGNOSIS — B9620 Unspecified Escherichia coli [E. coli] as the cause of diseases classified elsewhere: Secondary | ICD-10-CM | POA: Diagnosis present

## 2022-12-22 DIAGNOSIS — R569 Unspecified convulsions: Secondary | ICD-10-CM | POA: Diagnosis not present

## 2022-12-22 DIAGNOSIS — Z96611 Presence of right artificial shoulder joint: Secondary | ICD-10-CM | POA: Diagnosis present

## 2022-12-22 DIAGNOSIS — E876 Hypokalemia: Secondary | ICD-10-CM | POA: Diagnosis not present

## 2022-12-22 DIAGNOSIS — Z8673 Personal history of transient ischemic attack (TIA), and cerebral infarction without residual deficits: Secondary | ICD-10-CM | POA: Diagnosis not present

## 2022-12-22 DIAGNOSIS — I2489 Other forms of acute ischemic heart disease: Secondary | ICD-10-CM | POA: Diagnosis not present

## 2022-12-22 DIAGNOSIS — Z66 Do not resuscitate: Secondary | ICD-10-CM | POA: Diagnosis present

## 2022-12-22 DIAGNOSIS — Z888 Allergy status to other drugs, medicaments and biological substances status: Secondary | ICD-10-CM

## 2022-12-22 DIAGNOSIS — E860 Dehydration: Secondary | ICD-10-CM | POA: Diagnosis present

## 2022-12-22 DIAGNOSIS — E119 Type 2 diabetes mellitus without complications: Secondary | ICD-10-CM | POA: Diagnosis not present

## 2022-12-22 DIAGNOSIS — Z87891 Personal history of nicotine dependence: Secondary | ICD-10-CM | POA: Diagnosis not present

## 2022-12-22 DIAGNOSIS — I130 Hypertensive heart and chronic kidney disease with heart failure and stage 1 through stage 4 chronic kidney disease, or unspecified chronic kidney disease: Secondary | ICD-10-CM | POA: Diagnosis present

## 2022-12-22 DIAGNOSIS — N182 Chronic kidney disease, stage 2 (mild): Secondary | ICD-10-CM | POA: Diagnosis present

## 2022-12-22 DIAGNOSIS — R338 Other retention of urine: Secondary | ICD-10-CM

## 2022-12-22 DIAGNOSIS — I639 Cerebral infarction, unspecified: Secondary | ICD-10-CM | POA: Diagnosis not present

## 2022-12-22 DIAGNOSIS — N281 Cyst of kidney, acquired: Secondary | ICD-10-CM | POA: Diagnosis not present

## 2022-12-22 DIAGNOSIS — N1330 Unspecified hydronephrosis: Secondary | ICD-10-CM

## 2022-12-22 DIAGNOSIS — N134 Hydroureter: Secondary | ICD-10-CM | POA: Diagnosis not present

## 2022-12-22 DIAGNOSIS — F05 Delirium due to known physiological condition: Secondary | ICD-10-CM | POA: Diagnosis not present

## 2022-12-22 DIAGNOSIS — G9341 Metabolic encephalopathy: Secondary | ICD-10-CM | POA: Diagnosis not present

## 2022-12-22 DIAGNOSIS — Z87898 Personal history of other specified conditions: Secondary | ICD-10-CM

## 2022-12-22 DIAGNOSIS — I700 Atherosclerosis of aorta: Secondary | ICD-10-CM | POA: Diagnosis not present

## 2022-12-22 DIAGNOSIS — Z79899 Other long term (current) drug therapy: Secondary | ICD-10-CM

## 2022-12-22 DIAGNOSIS — E892 Postprocedural hypoparathyroidism: Secondary | ICD-10-CM | POA: Diagnosis present

## 2022-12-22 DIAGNOSIS — Z886 Allergy status to analgesic agent status: Secondary | ICD-10-CM

## 2022-12-22 DIAGNOSIS — Z87440 Personal history of urinary (tract) infections: Secondary | ICD-10-CM

## 2022-12-22 DIAGNOSIS — R7989 Other specified abnormal findings of blood chemistry: Secondary | ICD-10-CM | POA: Diagnosis present

## 2022-12-22 DIAGNOSIS — Z7982 Long term (current) use of aspirin: Secondary | ICD-10-CM

## 2022-12-22 DIAGNOSIS — I5030 Unspecified diastolic (congestive) heart failure: Secondary | ICD-10-CM | POA: Diagnosis not present

## 2022-12-22 DIAGNOSIS — R2689 Other abnormalities of gait and mobility: Secondary | ICD-10-CM | POA: Diagnosis not present

## 2022-12-22 DIAGNOSIS — Z803 Family history of malignant neoplasm of breast: Secondary | ICD-10-CM

## 2022-12-22 DIAGNOSIS — M810 Age-related osteoporosis without current pathological fracture: Secondary | ICD-10-CM | POA: Diagnosis present

## 2022-12-22 DIAGNOSIS — D649 Anemia, unspecified: Secondary | ICD-10-CM | POA: Diagnosis not present

## 2022-12-22 LAB — URINALYSIS, ROUTINE W REFLEX MICROSCOPIC
Bilirubin Urine: NEGATIVE
Glucose, UA: NEGATIVE mg/dL
Hgb urine dipstick: NEGATIVE
Ketones, ur: NEGATIVE mg/dL
Nitrite: POSITIVE — AB
Protein, ur: 30 mg/dL — AB
Specific Gravity, Urine: 1.011 (ref 1.005–1.030)
pH: 6 (ref 5.0–8.0)

## 2022-12-22 LAB — TROPONIN I (HIGH SENSITIVITY)
Troponin I (High Sensitivity): 54 ng/L — ABNORMAL HIGH (ref <18)
Troponin I (High Sensitivity): 94 ng/L — ABNORMAL HIGH (ref <18)
Troponin I (High Sensitivity): 97 ng/L — ABNORMAL HIGH (ref <18)

## 2022-12-22 LAB — SODIUM, URINE, RANDOM: Sodium, Ur: 44 mmol/L

## 2022-12-22 LAB — CBC
HCT: 41.2 % (ref 36.0–46.0)
Hemoglobin: 12.9 g/dL (ref 12.0–15.0)
MCH: 30.4 pg (ref 26.0–34.0)
MCHC: 31.3 g/dL (ref 30.0–36.0)
MCV: 96.9 fL (ref 80.0–100.0)
Platelets: 292 10*3/uL (ref 150–400)
RBC: 4.25 MIL/uL (ref 3.87–5.11)
RDW: 14.6 % (ref 11.5–15.5)
WBC: 9.6 10*3/uL (ref 4.0–10.5)
nRBC: 0 % (ref 0.0–0.2)

## 2022-12-22 LAB — CREATININE, URINE, RANDOM: Creatinine, Urine: 59 mg/dL

## 2022-12-22 LAB — BASIC METABOLIC PANEL
Anion gap: 11 (ref 5–15)
BUN: 30 mg/dL — ABNORMAL HIGH (ref 8–23)
CO2: 20 mmol/L — ABNORMAL LOW (ref 22–32)
Calcium: 13.1 mg/dL (ref 8.9–10.3)
Chloride: 103 mmol/L (ref 98–111)
Creatinine, Ser: 1.57 mg/dL — ABNORMAL HIGH (ref 0.44–1.00)
GFR, Estimated: 32 mL/min — ABNORMAL LOW (ref >60)
Glucose, Bld: 94 mg/dL (ref 70–99)
Potassium: 4 mmol/L (ref 3.5–5.1)
Sodium: 134 mmol/L — ABNORMAL LOW (ref 135–145)

## 2022-12-22 LAB — I-STAT CG4 LACTIC ACID, ED
Lactic Acid, Venous: 2.2 mmol/L (ref 0.5–1.9)
Lactic Acid, Venous: 1.4 mmol/L (ref 0.5–1.9)

## 2022-12-22 MED ORDER — ONDANSETRON HCL 4 MG/2ML IJ SOLN: 4.0000 mg | Freq: Four times a day (QID) | INTRAMUSCULAR | Status: DC | PRN

## 2022-12-22 MED ORDER — ONDANSETRON HCL 4 MG PO TABS: 4.0000 mg | ORAL_TABLET | Freq: Four times a day (QID) | ORAL | Status: DC | PRN

## 2022-12-22 MED ORDER — HEPARIN BOLUS VIA INFUSION
3000.0000 [IU] | Freq: Once | INTRAVENOUS | Status: AC
  Administered 2022-12-22: 3000 [IU] via INTRAVENOUS
  Filled 2022-12-22: qty 3000

## 2022-12-22 MED ORDER — ACETAMINOPHEN 650 MG RE SUPP: 650.0000 mg | Freq: Four times a day (QID) | RECTAL | Status: DC | PRN

## 2022-12-22 MED ORDER — SODIUM CHLORIDE 0.9 % IV BOLUS
1000.0000 mL | Freq: Once | INTRAVENOUS | Status: AC
  Administered 2022-12-22: 1000 mL via INTRAVENOUS

## 2022-12-22 MED ORDER — SODIUM CHLORIDE 0.9 % IV SOLN: INTRAVENOUS | Status: AC

## 2022-12-22 MED ORDER — PANTOPRAZOLE SODIUM 40 MG PO TBEC
40.0000 mg | DELAYED_RELEASE_TABLET | Freq: Every day | ORAL | Status: DC
  Administered 2022-12-23 – 2023-01-08 (×17): 40 mg via ORAL
  Filled 2022-12-22 (×16): qty 1

## 2022-12-22 MED ORDER — CARVEDILOL 12.5 MG PO TABS: 12.5000 mg | ORAL_TABLET | ORAL | Status: DC

## 2022-12-22 MED ORDER — ACETAMINOPHEN 325 MG PO TABS
650.0000 mg | ORAL_TABLET | Freq: Four times a day (QID) | ORAL | Status: DC | PRN
  Administered 2022-12-23 – 2023-01-07 (×15): 650 mg via ORAL
  Filled 2022-12-22 (×16): qty 2

## 2022-12-22 MED ORDER — ATORVASTATIN CALCIUM 10 MG PO TABS: 10.0000 mg | ORAL_TABLET | Freq: Every day | ORAL | Status: DC

## 2022-12-22 MED ORDER — SODIUM CHLORIDE 0.9 % IV SOLN
2.0000 g | Freq: Once | INTRAVENOUS | Status: AC
  Administered 2022-12-22: 2 g via INTRAVENOUS
  Filled 2022-12-22: qty 20

## 2022-12-22 MED ORDER — ASPIRIN 81 MG PO TBEC
81.0000 mg | DELAYED_RELEASE_TABLET | Freq: Every day | ORAL | Status: DC
  Administered 2022-12-23 – 2022-12-25 (×2): 81 mg via ORAL
  Filled 2022-12-22 (×2): qty 1

## 2022-12-22 MED ORDER — SODIUM CHLORIDE 0.9% FLUSH
3.0000 mL | Freq: Two times a day (BID) | INTRAVENOUS | Status: DC
  Administered 2022-12-23 – 2023-01-08 (×21): 3 mL via INTRAVENOUS

## 2022-12-22 MED ORDER — HEPARIN (PORCINE) 25000 UT/250ML-% IV SOLN
1000.0000 [IU]/h | INTRAVENOUS | Status: DC
  Administered 2022-12-22 – 2022-12-25 (×3): 900 [IU]/h via INTRAVENOUS
  Filled 2022-12-22 (×3): qty 250

## 2022-12-22 MED ORDER — SODIUM CHLORIDE 0.9 % IV SOLN
2.0000 g | INTRAVENOUS | Status: DC
  Administered 2022-12-23 – 2022-12-24 (×2): 2 g via INTRAVENOUS
  Filled 2022-12-22 (×2): qty 20

## 2022-12-22 NOTE — ED Provider Notes (Incomplete)
EMERGENCY DEPARTMENT AT [facility] Provider Note   CSN: [id number] Arrival date & time: 12/22/22  1426     History  Chief Complaint  Patient presents with  . Altered Mental Status    Tina Frank is a 82 y.o. female with medical history of CKD stage II, diabetes, GERD, hypertension, vertigo.  Patient presents to ED for evaluation of possible sepsis, UTI.  Unable to collect much information from the patient.  Per EMS note, patient being brought in complaint subdural fluid.  She was sent for UTI.  This began 2 days ago.  Staff said the patient has not wanted take fluids or her medication and she has dementia.  Reached out to facility staff.  Nursing home staff states that the patient is typically alert x 1 with confusion.  She is currently being treated by UTI and on 5 days of ciprofloxacin 500 mg twice daily.  Facility nurse states that this morning the patient refused her medication and also became very emotional which is out of the normal for her.  She did not eat or drink anything for breakfast or lunch.  Provider at the facility assessed patient and wanted her sent out for fluids and increased confusion.  Apparently patient had culture done and grew out gram-negative bacilli.  Patient was diagnosed with UTI on the 10th.   Altered Mental Status      Home Medications Prior to Admission medications   Medication Sig Start Date End Date Taking? Authorizing Provider  acetaminophen (TYLENOL) 500 MG tablet Take 1,000 mg by mouth in the morning and at bedtime. Will take 1 additional dose if needed in the afternoon    [provider]  allopurinol (ZYLOPRIM) 300 MG tablet Take 300 mg by mouth daily.    [provider]  aspirin EC 81 MG tablet Take 1 tablet (81 mg total) by mouth daily. Swallow whole. 10/18/22   Burnadette Pop, MD  atorvastatin (LIPITOR) 10 MG tablet Take 10 mg by mouth at bedtime.     [provider]  calcium carbonate  (TUMS - DOSED IN MG ELEMENTAL CALCIUM) 500 MG chewable tablet Chew 1 tablet by mouth 3 (three) times daily as needed for indigestion or heartburn.    [provider]  carvedilol (COREG) 12.5 MG tablet Take 12.5 mg by mouth See admin instructions. Take 12.5 mg by mouth in the morning and at 5 PM 12/04/14   [provider]  cloNIDine (CATAPRES) 0.1 MG tablet Take 0.1 mg by mouth daily as needed (for hypertension- if systolic b/p is over 160).    [provider]  diclofenac Sodium (VOLTAREN) 1 % GEL Apply 2-4 g topically 4 (four) times daily as needed (to painful sites).    [provider]  furosemide (LASIX) 40 MG tablet Take 40 mg by mouth daily as needed for edema. 08/15/17   [provider]  hydrALAZINE (APRESOLINE) 25 MG tablet Take 25 mg by mouth in the morning and at bedtime.    [provider]  magnesium oxide (MAG-OX) 400 (240 Mg) MG tablet Take 1 tablet (400 mg total) by mouth daily. 10/18/22   Burnadette Pop, MD  pantoprazole (PROTONIX) 40 MG tablet Take 40 mg by mouth See admin instructions. Take 40 mg by mouth in the morning and at 5 PM- BEFORE FOOD 08/23/18   [provider]  senna-docusate (SENOKOT-S) 8.6-50 MG tablet Take 1 tablet by mouth at bedtime as needed for moderate constipation. 10/17/22  Burnadette Pop, MD      Allergies    Abaloparatide, Mobic [meloxicam], Crestor [rosuvastatin], Norvasc [amlodipine besylate], Simvastatin, Sulfa antibiotics, and Zetia [ezetimibe]    Review of Systems   Review of Systems  Physical Exam Updated Vital Signs BP (!) 150/64   Pulse (!) 111   Temp 97.8 F (36.6 C) (Oral)   Resp 19   Ht 5\' 5"  (1.651 m)   Wt 63 kg   SpO2 97%   BMI 23.11 kg/m  Physical Exam  ED Results / Procedures / Treatments   Labs (all labs ordered are listed, but only abnormal results are displayed) Labs Reviewed  CULTURE, BLOOD (ROUTINE X 2)  CULTURE, BLOOD (ROUTINE X 2)  CBC  BASIC METABOLIC PANEL   URINALYSIS, ROUTINE W REFLEX MICROSCOPIC  I-STAT CG4 LACTIC ACID, ED    EKG None  Radiology No results found.  Procedures Procedures  {Document cardiac monitor, telemetry assessment procedure when appropriate:1}  Medications Ordered in ED Medications  sodium chloride 0.9 % bolus 1,000 mL (has no administration in time range)    ED Course/ Medical Decision Making/ A&P Clinical Course as of 12/22/22 1511  Mon Dec 22, 2022  1504 Baseline is alert x1 with confusion. Currently being treatd for UTI, on cipro for 5 days BID. This morning patient refused medication. Very emotional, out of the norm. Did not eat or drink anything for breakfast or lunch. Fluids and increased confusion. Gram negative bacilli (?) cipro started on 11th, 500mg . Diagnosed on the 10th.  [CG]    Clinical Course User Index [CG] Al Decant, PA-C   {   Click here for ABCD2, HEART and other calculatorsREFRESH Note before signing :1}                              Medical Decision Making Amount and/or Complexity of Data Reviewed Labs: ordered. Radiology: ordered.   ***  {Document critical care time when appropriate:1} {Document review of labs and clinical decision tools ie heart score, Chads2Vasc2 etc:1}  {Document your independent review of radiology images, and any outside records:1} {Document your discussion with family members, caretakers, and with consultants:1} {Document social determinants of health affecting pt's care:1} {Document your decision making why or why not admission, treatments were needed:1} Final Clinical Impression(s) / ED Diagnoses Final diagnoses:  None    Rx / DC Orders ED Discharge Orders     None

## 2022-12-22 NOTE — ED Notes (Signed)
ED TO INPATIENT HANDOFF REPORT  ED Nurse Name and Phone #: Linus Orn Name/Age/Gender Tina Frank 82 y.o. female Room/Bed: WA23/WA23  Code Status   Code Status: Limited: Do not attempt resuscitation (DNR) -DNR-LIMITED -Do Not Intubate/DNI   Home/SNF/Other Nursing Home Patient oriented to: self Is this baseline? Yes   Triage Complete: Triage complete  Chief Complaint Acute encephalopathy [G93.40]  Triage Note Patient BIB [facility] from [facility]. Sent out for UTI. This began 2 days ago. Staff said patient does not want to take fluids/her medication. Has dementia.    Allergies Allergies  Allergen Reactions   Abaloparatide Other (See Comments)    "Burred vision, lightheaded" (patient does not recall this in 2024)   Mobic [Meloxicam] Hypertension   Crestor [Rosuvastatin] Other (See Comments)    Muscle aches   Norvasc [Amlodipine Besylate] Swelling and Other (See Comments)    Ankle swelling   Simvastatin Other (See Comments)    Muscle aches  Other Reaction(s): high LFTs   Sulfa Antibiotics Rash   Zetia [Ezetimibe] Other (See Comments)    Muscle aches    Level of Care/Admitting Diagnosis ED Disposition     ED Disposition  Admit   Condition  --   Comment  Hospital Area: [facility] [[id number]]  Level of Care: Progressive [102]  Admit to Progressive based on following criteria: MULTISYSTEM THREATS such as stable sepsis, metabolic/electrolyte imbalance with or without encephalopathy that is responding to early treatment.  May admit patient to [facility] or [facility] if equivalent level of care is available:: Yes  Covid Evaluation: Asymptomatic - no recent exposure (last 10 days) testing not required  Diagnosis: Acute encephalopathy [161096]  Admitting Physician: Briscoe Deutscher [[id number]]  Attending Physician: Briscoe Deutscher [[id number]]  Certification:: I certify this patient will need inpatient services for at least 2 midnights   Expected Medical Readiness: 12/25/2022          B Medical/Surgery History Past Medical History:  Diagnosis Date   CKD (chronic kidney disease), stage II    nephrologist-- dr Kathrene Bongo  [facility])   Diabetes mellitus type 2, diet-controlled (HCC)    followed by pcp---   (09-20-2019  per pt does not check blood sugar at home)   Fracture of humeral shaft, right, closed 04/25/2022   GERD (gastroesophageal reflux disease)    History of primary hyperparathyroidism    s/p  parathyroidectomy right inferior on 12-02-2012   Humerus fracture 04/25/2022   Hyperlipidemia    Hypertension    followd by nephrologist----  (09-20-2019  per pt had stress test done some time ago , unsure when/ where, thinks told ok)   OA (osteoarthritis)    Osteoporosis    Prosthetic joint infection (HCC)    followed by dr j. hatcher ([facility])   left total shoulder arthroplasty 12/ 2016  ; 12/ 2020  revision with explant joint replacement and hemiarthroplasty;  completed 6 month antibiotic 05/ 2021   Vertigo    Past Surgical History:  Procedure Laterality Date   COLONOSCOPY     DILATATION & CURETTAGE/HYSTEROSCOPY WITH MYOSURE N/A 09/28/2019   Procedure: DILATATION & CURETTAGE/HYSTEROSCOPY WITH MYOSURE;  Surgeon: Olivia Mackie, MD;  Location: [facility];  Service: Gynecology;  Laterality: N/A;   EYE SURGERY  yrs ago   laser surgery for retinal tear.  (unilateral , pt unsure which side)   ORIF HUMERUS FRACTURE Right 04/27/2022   Procedure: OPEN REDUCTION INTERNAL FIXATION (ORIF) PROXIMAL HUMERUS FRACTURE;  Surgeon: Bjorn Pippin, MD;  Location: [facility];  Service: Orthopedics;  Laterality: Right;   PARATHYROIDECTOMY N/A 12/02/2012   Procedure: PARATHYROIDECTOMY with frozen section ;  Surgeon: Velora Heckler, MD;  Location: [facility];  Service: General;  Laterality: N/A;   REVERSE SHOULDER ARTHROPLASTY Right 04/27/2022   Procedure: REVERSE SHOULDER ARTHROPLASTY;  Surgeon: Bjorn Pippin, MD;  Location:  [facility];  Service: Orthopedics;  Laterality: Right;   SHOULDER INJECTION Left 07/27/2013   Procedure: SHOULDER INJECTION;  Surgeon: Loreta Ave, MD;  Location[facility];  Service: Orthopedics;  Laterality: Left;   STERIOD INJECTION Right 02/14/2015   Procedure: RIGHT SHOULDER CORTISONE INJECTION;  Surgeon: Loreta Ave, MD;  Location: [facility] OR;  Service: Orthopedics;  Laterality: Right;   TOTAL HIP ARTHROPLASTY Left 07/27/2013   Procedure: TOTAL HIP ARTHROPLASTY ANTERIOR APPROACH;  Surgeon: Loreta Ave, MD;  Location[facility];  Service: Orthopedics;  Laterality: Left;   TOTAL SHOULDER ARTHROPLASTY Left 02/14/2015   Procedure: LEFT TOTAL SHOULDER ARTHROPLASTY;  Surgeon: Loreta Ave, MD;  Location: [facility] OR;  Service: Orthopedics;  Laterality: Left;   TOTAL SHOULDER REVISION Left 02/09/2019   Procedure: TOTAL SHOULDER REVISION;  Surgeon: Bjorn Pippin, MD;  Location: [facility];  Service: Orthopedics;  Laterality: Left;     A IV Location/Drains/Wounds Patient Lines/Drains/Airways Status     Active Line/Drains/Airways     Name Placement date Placement time Site Days   Peripheral IV 12/22/22 20 G Anterior;Right Wrist 12/22/22  1540  Wrist  less than 1   Peripheral IV 12/22/22 20 G Anterior;Left Forearm 12/22/22  1615  Forearm  less than 1   Wound / Incision (Open or Dehisced) 04/25/22 Laceration Head Left Laceration with stitches to Left forehead 04/25/22  2115  Head  241            Intake/Output Last 24 hours No intake or output data in the 24 hours ending 12/22/22 2144  Labs/Imaging Results for orders placed or performed during the hospital encounter of 12/22/22 (from the past 48 hour(s))  I-Stat CG4 Lactic Acid     Status: Abnormal   Collection Time: 12/22/22  3:51 PM  Result Value Ref Range   Lactic Acid, Venous 2.2 (HH) 0.5 - 1.9 mmol/L   Comment NOTIFIED PHYSICIAN   CBC     Status: None   Collection Time: 12/22/22  4:10 PM  Result Value Ref Range   WBC 9.6 4.0 - 10.5 K/uL    RBC 4.25 3.87 - 5.11 MIL/uL   Hemoglobin 12.9 12.0 - 15.0 g/dL   HCT 16.1 09.6 - 04.5 %   MCV 96.9 80.0 - 100.0 fL   MCH 30.4 26.0 - 34.0 pg   MCHC 31.3 30.0 - 36.0 g/dL   RDW 40.9 81.1 - 91.4 %   Platelets 292 150 - 400 K/uL   nRBC 0.0 0.0 - 0.2 %    Comment: Performed at [facility], [address], [place], [place] [address]  Urinalysis, Routine w reflex microscopic -Urine, Catheterized     Status: Abnormal   Collection Time: 12/22/22  4:56 PM  Result Value Ref Range   Color, Urine YELLOW YELLOW   APPearance HAZY (A) CLEAR   Specific Gravity, Urine 1.011 1.005 - 1.030   pH 6.0 5.0 - 8.0   Glucose, UA NEGATIVE NEGATIVE mg/dL   Hgb urine dipstick NEGATIVE NEGATIVE   Bilirubin Urine NEGATIVE NEGATIVE   Ketones, ur NEGATIVE NEGATIVE mg/dL   Protein, ur  30 (A) NEGATIVE mg/dL   Nitrite POSITIVE (A) NEGATIVE   Leukocytes,Ua TRACE (A) NEGATIVE   RBC / HPF 0-5 0 - 5 RBC/hpf   WBC, UA 0-5 0 - 5 WBC/hpf   Bacteria, UA MANY (A) NONE SEEN   Squamous Epithelial / HPF 6-10 0 - 5 /HPF   Mucus PRESENT    Hyaline Casts, UA PRESENT     Comment: Performed at [facility], [address], [place], [place] [address]  Troponin I (High Sensitivity)     Status: Abnormal   Collection Time: 12/22/22  5:42 PM  Result Value Ref Range   Troponin I (High Sensitivity) 54 (H) <18 ng/L    Comment: (NOTE) Elevated high sensitivity troponin I (hsTnI) values and significant  changes across serial measurements may suggest ACS but many other  chronic and acute conditions are known to elevate hsTnI results.  Refer to the "Links" section for chest pain algorithms and additional  guidance. Performed at [facility], [address] [place], [place] [address]   I-Stat CG4 Lactic Acid     Status: None   Collection Time: 12/22/22  5:43 PM  Result Value Ref Range   Lactic Acid, Venous 1.4 0.5 - 1.9 mmol/L  Basic metabolic panel     Status: Abnormal    Collection Time: 12/22/22  5:59 PM  Result Value Ref Range   Sodium 134 (L) 135 - 145 mmol/L   Potassium 4.0 3.5 - 5.1 mmol/L   Chloride 103 98 - 111 mmol/L   CO2 20 (L) 22 - 32 mmol/L   Glucose, Bld 94 70 - 99 mg/dL    Comment: Glucose reference range applies only to samples taken after fasting for at least 8 hours.   BUN 30 (H) 8 - 23 mg/dL   Creatinine, Ser 9.52 (H) 0.44 - 1.00 mg/dL   Calcium 84.1 (HH) 8.9 - 10.3 mg/dL    Comment: CRITICAL RESULT CALLED TO, READ BACK BY AND VERIFIED WITH S.GRAY, RN AT 1932 ON 10.14.24 BY N.THOMPSON    GFR, Estimated 32 (L) >60 mL/min    Comment: (NOTE) Calculated using the CKD-EPI Creatinine Equation (2021)    Anion gap 11 5 - 15    Comment: Performed at [facility], [address] [name], [place] [address]  Blood culture (routine x 2)     Status: None (Preliminary result)   Collection Time: 12/22/22  6:34 PM   Specimen: BLOOD RIGHT HAND  Result Value Ref Range   Specimen Description      BLOOD RIGHT HAND Performed at [facility], [address] [place], [place] [address]    Special Requests      BOTTLES DRAWN AEROBIC AND ANAEROBIC Blood Culture results may not be optimal due to an inadequate volume of blood received in culture bottles Performed at [facility], [address] [place], [place] [address]    Culture PENDING    Report Status PENDING   Troponin I (High Sensitivity)     Status: Abnormal   Collection Time: 12/22/22  8:00 PM  Result Value Ref Range   Troponin I (High Sensitivity) 94 (H) <18 ng/L    Comment: READ BACK AND VERIFIED WITH GRAY,S RN @ 2108 12/22/22 BY CHILDRESS,E (NOTE) Elevated high sensitivity troponin I (hsTnI) values and significant  changes across serial measurements may suggest ACS but many other  chronic and acute conditions are known to elevate hsTnI results.  Refer to the "  Links" section for chest pain algorithms and additional  guidance. Performed at  [facility], [address], [place], [place] [address]    DG Chest Portable 1 View  Result Date: 12/22/2022 CLINICAL DATA:  Sepsis EXAM: PORTABLE CHEST 1 VIEW COMPARISON:  10/14/2022 FINDINGS: Stable heart size. Aortic atherosclerosis. No focal airspace consolidation, pleural effusion, or pneumothorax. Bilateral shoulder prostheses. IMPRESSION: No active disease. Electronically Signed   By: Duanne Guess D.O.   On: 12/22/2022 17:43    Pending Labs Unresulted Labs (From admission, onward)     Start     Ordered   12/23/22 0500  Parathyroid hormone, intact (no Ca)  Tomorrow morning,   R        12/22/22 2127   12/23/22 0500  TSH  Tomorrow morning,   R        12/22/22 2127   12/23/22 0500  Basic metabolic panel  Daily,   R      12/22/22 2127   12/23/22 0500  Magnesium  Tomorrow morning,   R        12/22/22 2127   12/23/22 0500  CBC  Daily,   R      12/22/22 2127   12/22/22 2126  Sodium, urine, random  Add-on,   AD        12/22/22 2127   12/22/22 2126  Creatinine, urine, random  Add-on,   AD        12/22/22 2127   12/22/22 2126  Urea nitrogen, urine  Add-on,   AD        12/22/22 2127   12/22/22 2111  Urine Culture  Once,   URGENT       Question:  Indication  Answer:  Bacteriuria screening (OB/GYN or Uro)   12/22/22 2110            Vitals/Pain Today's Vitals   12/22/22 1446 12/22/22 1753 12/22/22 1835 12/22/22 2105  BP: (!) 150/64 109/79  (!) 91/48  Pulse: (!) 111 94 61 72  Resp: 19 18 (!) 21 (!) 21  Temp: 97.8 F (36.6 C)  97.9 F (36.6 C)   TempSrc: Oral  Oral   SpO2: 97% 97% 90% 95%  Weight:      Height:      PainSc:        Isolation Precautions No active isolations  Medications Medications  aspirin EC tablet 81 mg (has no administration in time range)  atorvastatin (LIPITOR) tablet 10 mg (has no administration in time range)  carvedilol (COREG) tablet 12.5 mg (has no administration in time range)  pantoprazole (PROTONIX) EC tablet 40  mg (has no administration in time range)  cefTRIAXone (ROCEPHIN) 2 g in sodium chloride 0.9 % 100 mL IVPB (has no administration in time range)  sodium chloride flush (NS) 0.9 % injection 3 mL (has no administration in time range)  acetaminophen (TYLENOL) tablet 650 mg (has no administration in time range)    Or  acetaminophen (TYLENOL) suppository 650 mg (has no administration in time range)  ondansetron (ZOFRAN) tablet 4 mg (has no administration in time range)    Or  ondansetron (ZOFRAN) injection 4 mg (has no administration in time range)  0.9 %  sodium chloride infusion (has no administration in time range)  sodium chloride 0.9 % bolus 1,000 mL (0 mLs Intravenous Stopped 12/22/22 1950)  cefTRIAXone (ROCEPHIN) 2 g in sodium chloride 0.9 % 100 mL IVPB (0 g Intravenous Stopped 12/22/22 1950)  sodium chloride 0.9 % bolus  1,000 mL (1,000 mLs Intravenous New Bag/Given 12/22/22 1951)    Mobility walks with device     Focused Assessments Cardiac Assessment Handoff:    Lab Results  Component Value Date   TROPONINI <0.30 10/06/2013   Lab Results  Component Value Date   DDIMER 4.94 (H) 10/06/2013   Does the Patient currently have chest pain? No    R Recommendations: See Admitting Provider Note  Report given to:   Additional Notes: alert to self. Per son pt normally walks with a walker but has not done so lately. Son is at bedside.

## 2022-12-22 NOTE — Progress Notes (Signed)
PHARMACY - ANTICOAGULATION CONSULT NOTE  Pharmacy Consult for heparin Indication: atrial fibrillation  Allergies  Allergen Reactions   Abaloparatide Other (See Comments)    "Burred vision, lightheaded" (patient does not recall this in 2024)   Mobic [Meloxicam] Hypertension   Crestor [Rosuvastatin] Other (See Comments)    Muscle aches   Norvasc [Amlodipine Besylate] Swelling and Other (See Comments)    Ankle swelling   Simvastatin Other (See Comments)    Muscle aches  Other Reaction(s): high LFTs   Sulfa Antibiotics Rash   Zetia [Ezetimibe] Other (See Comments)    Muscle aches    Patient Measurements: Height: 5\' 5"  (165.1 cm) Weight: 63 kg (138 lb 14.2 oz) IBW/kg (Calculated) : 57 Heparin Dosing Weight: 63kg  Vital Signs: Temp: 97.9 F (36.6 C) (10/14 1835) Temp Source: Oral (10/14 1835) BP: 91/48 (10/14 2105) Pulse Rate: 72 (10/14 2105)  Labs: Recent Labs    12/22/22 1610 12/22/22 1742 12/22/22 1759 12/22/22 2000  HGB 12.9  --   --   --   HCT 41.2  --   --   --   PLT 292  --   --   --   CREATININE  --   --  1.57*  --   TROPONINIHS  --  54*  --  94*    Estimated Creatinine Clearance: 24 mL/min (A) (by C-G formula based on SCr of 1.57 mg/dL (H)).   Medical History: Past Medical History:  Diagnosis Date   CKD (chronic kidney disease), stage II    nephrologist-- dr Kathrene Bongo  [facility])   Diabetes mellitus type 2, diet-controlled (HCC)    followed by pcp---   (09-20-2019  per pt does not check blood sugar at home)   Fracture of humeral shaft, right, closed 04/25/2022   GERD (gastroesophageal reflux disease)    History of primary hyperparathyroidism    s/p  parathyroidectomy right inferior on 12-02-2012   Humerus fracture 04/25/2022   Hyperlipidemia    Hypertension    followd by nephrologist----  (09-20-2019  per pt had stress test done some time ago , unsure when/ where, thinks told ok)   OA (osteoarthritis)    Osteoporosis    Prosthetic  joint infection (HCC)    followed by dr j. hatcher (ID)   left total shoulder arthroplasty 12/ 2016  ; 12/ 2020  revision with explant joint replacement and hemiarthroplasty;  completed 6 month antibiotic 05/ 2021   Vertigo      Assessment: 82 y.o. female with medical history significant for type 2 diabetes mellitus, hypertension, hyperlipidemia, chronic HFpEF, and ischemic CVA in August 2024 who presents from her SNF with worsening confusion and refusal to eat, drink, or take her medications.  Pharmacy consulted to dose heparin for AFib.  No prior AC noted  CBC WNL, trop 94, Scr 1.57   Goal of Therapy:  Heparin level 0.3-0.7 units/ml Monitor platelets by anticoagulation protocol: Yes   Plan:  Heparin bolus 3000 units x 1 Start heparin drip at 900 units/hr Heparin level in 8 hours Daily CBC  Arley Phenix RPh 12/22/2022, 9:39 PM

## 2022-12-22 NOTE — Progress Notes (Signed)
Location:  [facility] Room Number: 66/A Place of Service:  SNF (31) Provider:  Octavia Heir, NP   Sigmund Hazel, MD  Patient Care Team: Sigmund Hazel, MD as PCP - General (Family Medicine) Suzi Roots as Physician Assistant (Dermatology)  Extended Emergency Contact Information Primary Emergency Contact: [name], [place] [address] [place] Home Phone: [phone] Work Phone: [phone] Mobile Phone: [phone] Relation: Son Secondary Emergency Contact: Natter,[name]  [place] Phone: [phone] Relation: Son  Code Status:  DNR Goals of care: Advanced Directive information    12/22/2022    2:45 PM  Advanced Directives  Does Patient Have a Medical Advance Directive? No  Would patient like information on creating a medical advance directive? No - Patient declined     Chief Complaint  Patient presents with   Acute Visit    Poor po intake    HPI:  Pt is a 82 y.o. female seen today for acute visit due to increased confusion, poor po intake.   She currently resides on the skilled nursing unit at [facility]. PMH: HTN, CVA, pulmonary edema, GERD, neoplasm of adrenal glrand, prediabetes, OA, osteoporosis, dementia, gout, hypercalcemia, and unstable gait.   Hospitalized 08/04-08/09 due to UTI, encephalopathy, acute CVA. MRI small punctate infarct in the posteerior limb of R internal capsule, no focal weakness.   10/09 increased confusion. UA/culture, cbc/diff, bmp ordered. Calcium was found to be 13.6. Rechecked calcium and PTH> not listed in chart review. 10/11 she was started on Cipro 500 mg po BID x 5 days. Over the weekend she continued to have increased confusion and weakness. In the last 24 hours, she has been refusing medications, including crushed. She is also not eating or drinking. She was unable to express needs during our encounter. Words were slightly slurred.  Baseline word finding. H/o frequent UTI per son. Recommend ED evaluation. Son agreeable. Afebrile. Vital stable.    Past Medical History:  Diagnosis Date   CKD (chronic kidney disease), stage II    nephrologist-- dr Kathrene Bongo  [facility])   Diabetes mellitus type 2, diet-controlled (HCC)    followed by pcp---   (09-20-2019  per pt does not check blood sugar at home)   Fracture of humeral shaft, right, closed 04/25/2022   GERD (gastroesophageal reflux disease)    History of primary hyperparathyroidism    s/p  parathyroidectomy right inferior on 12-02-2012   Humerus fracture 04/25/2022   Hyperlipidemia    Hypertension    followd by nephrologist----  (09-20-2019  per pt had stress test done some time ago , unsure when/ where, thinks told ok)   OA (osteoarthritis)    Osteoporosis    Prosthetic joint infection (HCC)    followed by dr j. hatcher (ID)   left total shoulder arthroplasty 12/ 2016  ; 12/ 2020  revision with explant joint replacement and hemiarthroplasty;  completed 6 month antibiotic 05/ 2021   Vertigo    Past Surgical History:  Procedure Laterality Date   COLONOSCOPY     DILATATION & CURETTAGE/HYSTEROSCOPY WITH MYOSURE N/A 09/28/2019   Procedure: DILATATION & CURETTAGE/HYSTEROSCOPY WITH MYOSURE;  Surgeon: Olivia Mackie, MD;  Location: [facility];  Service: Gynecology;  Laterality: N/A;   EYE SURGERY  yrs ago   laser surgery for retinal tear.  (unilateral , pt unsure which side)   ORIF HUMERUS FRACTURE Right 04/27/2022   Procedure: OPEN  REDUCTION INTERNAL FIXATION (ORIF) PROXIMAL HUMERUS FRACTURE;  Surgeon: Bjorn Pippin, MD;  Location: [facility];  Service: Orthopedics;  Laterality: Right;   PARATHYROIDECTOMY N/A 12/02/2012   Procedure: PARATHYROIDECTOMY with frozen section ;  Surgeon: Velora Heckler, MD;  Location: [facility];  Service: General;  Laterality: N/A;   REVERSE SHOULDER ARTHROPLASTY Right 04/27/2022   Procedure: REVERSE SHOULDER ARTHROPLASTY;   Surgeon: Bjorn Pippin, MD;  Location: [facility];  Service: Orthopedics;  Laterality: Right;   SHOULDER INJECTION Left 07/27/2013   Procedure: SHOULDER INJECTION;  Surgeon: Loreta Ave, MD;  Location: [facility] OR;  Service: Orthopedics;  Laterality: Left;   STERIOD INJECTION Right 02/14/2015   Procedure: RIGHT SHOULDER CORTISONE INJECTION;  Surgeon: Loreta Ave, MD;  Location: [facility] OR;  Service: Orthopedics;  Laterality: Right;   TOTAL HIP ARTHROPLASTY Left 07/27/2013   Procedure: TOTAL HIP ARTHROPLASTY ANTERIOR APPROACH;  Surgeon: Loreta Ave, MD;  Location: [facility];  Service: Orthopedics;  Laterality: Left;   TOTAL SHOULDER ARTHROPLASTY Left 02/14/2015   Procedure: LEFT TOTAL SHOULDER ARTHROPLASTY;  Surgeon: Loreta Ave, MD;  Location: [facility] OR;  Service: Orthopedics;  Laterality: Left;   TOTAL SHOULDER REVISION Left 02/09/2019   Procedure: TOTAL SHOULDER REVISION;  Surgeon: Bjorn Pippin, MD;  Location: [facility];  Service: Orthopedics;  Laterality: Left;    Allergies  Allergen Reactions   Abaloparatide Other (See Comments)    "Burred vision, lightheaded" (patient does not recall this in 2024)   Mobic [Meloxicam] Hypertension   Crestor [Rosuvastatin] Other (See Comments)    Muscle aches   Norvasc [Amlodipine Besylate] Swelling and Other (See Comments)    Ankle swelling   Simvastatin Other (See Comments)    Muscle aches  Other Reaction(s): high LFTs   Sulfa Antibiotics Rash   Zetia [Ezetimibe] Other (See Comments)    Muscle aches    Facility-Administered Encounter Medications as of 12/22/2022  Medication   cefTRIAXone (ROCEPHIN) 2 g in sodium chloride 0.9 % 100 mL IVPB   Outpatient Encounter Medications as of 12/22/2022  Medication Sig   acetaminophen (TYLENOL) 500 MG tablet Take 1,000 mg by mouth in the morning and at bedtime. Will take 1 additional dose if needed in the afternoon   allopurinol (ZYLOPRIM) 300 MG tablet Take 300 mg by mouth daily.   aspirin EC 81 MG tablet Take 1  tablet (81 mg total) by mouth daily. Swallow whole.   atorvastatin (LIPITOR) 10 MG tablet Take 10 mg by mouth at bedtime.    calcium carbonate (TUMS - DOSED IN MG ELEMENTAL CALCIUM) 500 MG chewable tablet Chew 1 tablet by mouth 3 (three) times daily as needed for indigestion or heartburn.   carvedilol (COREG) 12.5 MG tablet Take 12.5 mg by mouth See admin instructions. Take 12.5 mg by mouth in the morning and at 5 PM   cloNIDine (CATAPRES) 0.1 MG tablet Take 0.1 mg by mouth daily as needed (for hypertension- if systolic b/p is over 160).   diclofenac Sodium (VOLTAREN) 1 % GEL Apply 2-4 g topically 4 (four) times daily as needed (to painful sites).   furosemide (LASIX) 40 MG tablet Take 40 mg by mouth daily as needed for edema.   hydrALAZINE (APRESOLINE) 25 MG tablet Take 25 mg by mouth in the morning and at bedtime.   magnesium oxide (MAG-OX) 400 (240 Mg) MG tablet Take 1 tablet (400 mg total) by mouth daily.   pantoprazole (PROTONIX) 40 MG tablet Take 40 mg by mouth See admin instructions.  Take 40 mg by mouth in the morning and at 5 PM- BEFORE FOOD   senna-docusate (SENOKOT-S) 8.6-50 MG tablet Take 1 tablet by mouth at bedtime as needed for moderate constipation.    Review of Systems  Unable to perform ROS: Dementia    Immunization History  Administered Date(s) Administered   Influenza Split 11/24/2008   Influenza,inj,Quad PF,6+ Mos 11/20/2010, 02/01/2013   Influenza-Unspecified 02/19/2018   Moderna Sars-Covid-2 Vaccination 04/23/2019, 05/21/2019, 02/20/2020   Pneumococcal Conjugate-13 03/16/2014   Pneumococcal Polysaccharide-23 04/28/2005   Td 04/28/2005   Tdap 06/08/2017   Zoster Recombinant(Shingrix) 01/14/2019   Zoster, Live 07/21/2007   Pertinent  Health Maintenance Due  Topic Date Due   OPHTHALMOLOGY EXAM  Never done   FOOT EXAM  10/01/2022   INFLUENZA VACCINE  10/09/2022   HEMOGLOBIN A1C  04/15/2023   DEXA SCAN  Completed      04/12/2019    2:21 PM 07/19/2019    2:20 PM  09/28/2019    8:51 AM 06/21/2020    5:59 PM 09/23/2021    1:10 PM  Fall Risk  Falls in the past year? 0 0     (RETIRED) Patient Fall Risk Level Low fall risk Low fall risk Moderate fall risk Low fall risk Low fall risk  Fall risk Follow up Falls evaluation completed Falls evaluation completed      Functional Status Survey:    Vitals:   12/22/22 1804  BP: 126/68  Pulse: (!) 58  Resp: 18  Temp: 97.6 F (36.4 C)  SpO2: 98%  Weight: 137 lb 12.8 oz (62.5 kg)  Height: 5\' 5"  (1.651 m)   Body mass index is 22.93 kg/m. Physical Exam Vitals reviewed.  Constitutional:      Appearance: She is ill-appearing.  HENT:     Head: Normocephalic.  Eyes:     General:        Right eye: No discharge.        Left eye: No discharge.  Cardiovascular:     Rate and Rhythm: Regular rhythm.     Pulses: Normal pulses.     Heart sounds: Normal heart sounds.  Pulmonary:     Effort: Pulmonary effort is normal. No respiratory distress.     Breath sounds: Normal breath sounds. No wheezing or rales.  Abdominal:     General: Bowel sounds are normal.     Palpations: Abdomen is soft.  Musculoskeletal:     Cervical back: Neck supple.     Right lower leg: No edema.     Left lower leg: No edema.  Skin:    General: Skin is warm.  Neurological:     General: No focal deficit present.     Mental Status: She is alert. Mental status is at baseline.     Motor: Weakness present.     Gait: Gait abnormal.  Psychiatric:     Comments: Slurred speech, unable to answer simple questions     Labs reviewed: Recent Labs    10/13/22 0738 10/14/22 0248 10/15/22 0546 10/16/22 0043 10/16/22 2344  NA 138 137 141 133* 135  K 3.2* 3.8 3.5 3.2* 3.9  CL 108 108 109 102 103  CO2 21* 21* 20* 22 23  GLUCOSE 84 116* 126* 129* 126*  BUN 11 9 12 20 21   CREATININE 0.92 0.95 0.96 0.96 0.93  CALCIUM 10.3 9.9 10.3 10.1 10.4*  MG 1.9 1.5* 1.6* 1.9 1.7  PHOS 2.3* 3.4  --  2.6  --    Recent  Labs    10/12/22 2013  10/13/22 0738  AST 16 14*  ALT 9 10  ALKPHOS 65 56  BILITOT 1.1 1.2  PROT 5.7* 5.1*  ALBUMIN 2.9* 2.6*   Recent Labs    10/12/22 2013 10/14/22 0248 10/15/22 0546 12/22/22 1610  WBC 8.7 12.1* 11.0* 9.6  NEUTROABS 6.2  --   --   --   HGB 11.7* 11.3* 11.2* 12.9  HCT 36.2 34.9* 34.5* 41.2  MCV 89.6 90.6 87.6 96.9  PLT 167 225 206 292   Lab Results  Component Value Date   TSH 1.293 10/12/2022   Lab Results  Component Value Date   HGBA1C 6.0 (H) 10/13/2022   Lab Results  Component Value Date   CHOL 121 10/13/2022   HDL 44 10/13/2022   LDLCALC 62 10/13/2022   TRIG 74 10/13/2022   CHOLHDL 2.8 10/13/2022    Significant Diagnostic Results in last 30 days:  DG Chest Portable 1 View  Result Date: 12/22/2022 CLINICAL DATA:  Sepsis EXAM: PORTABLE CHEST 1 VIEW COMPARISON:  10/14/2022 FINDINGS: Stable heart size. Aortic atherosclerosis. No focal airspace consolidation, pleural effusion, or pneumothorax. Bilateral shoulder prostheses. IMPRESSION: No active disease. Electronically Signed   By: Duanne Guess D.O.   On: 12/22/2022 17:43    Assessment/Plan 1. Confusion - began 10/09 - 10/11 started on Cipro 500 mg po BID x 5 days - increased confusion over weekend - unable to take medications, poor po intake - h/o hospitalization UTI, encephalopathy, acute CVA 08/04-08/09 - send to ED for further evaluation  2. Acute cystitis without hematuria - see above - unable to locate urine culture in [facility] chart review  3. Hypercalcemia - recently 13.6 - repeat bmp and PTH ordered> unable to locate results     Family/ staff Communication: plan discussed with patient, son and nursing   Labs/tests ordered:  send to ED for evaluation

## 2022-12-22 NOTE — H&P (Signed)
History and Physical    Tina Frank GUY:[id number] DOB: [birth year] DOA: 12/22/2022  PCP: Sigmund Hazel, MD   Patient coming from: SNF   Chief Complaint: Increased confusion, not eating or drinking   HPI: Tina Frank is a 82 y.o. female with medical history significant for type 2 diabetes mellitus, hypertension, hyperlipidemia, chronic HFpEF, and ischemic CVA in August 2024 who presents from her SNF with worsening confusion and refusal to eat, drink, or take her medications.  Patient was noted to be more confused than usual on 12/17/2022.  She was evaluated with UA and basic labs, diagnosed with UTI and hypercalcemia, and was started on ciprofloxacin on 12/19/2022.  Despite starting the antibiotic, confusion persists and the patient has been refusing to take her medicines, eat, or drink.  ED Course: Upon arrival to the ED, patient is found to be afebrile and saturating mid 90s on room air with labile heart rate and systolic blood pressure of 91 and greater.  EKG demonstrates atrial fibrillation with rate 108.  Chest x-ray is negative for acute findings.  Labs are most notable for creatinine 1.57, calcium 13.1, lactic acid 2.2, normal WBC, and troponin of 54 which then increased to 94.  Blood and urine cultures were ordered from the ED and the patient was treated with 2 L of NS and 2 g IV Rocephin.  Cardiology was consulted by the ED PA.  Review of Systems:  ROS limited by patient's clinical condition.  Past Medical History:  Diagnosis Date   CKD (chronic kidney disease), stage II    nephrologist-- dr Kathrene Bongo  [facility])   Diabetes mellitus type 2, diet-controlled (HCC)    followed by pcp---   (09-20-2019  per pt does not check blood sugar at home)   Fracture of humeral shaft, right, closed 04/25/2022   GERD (gastroesophageal reflux disease)    History of primary hyperparathyroidism    s/p  parathyroidectomy right inferior on 12-02-2012   Humerus fracture 04/25/2022    Hyperlipidemia    Hypertension    followd by nephrologist----  (09-20-2019  per pt had stress test done some time ago , unsure when/ where, thinks told ok)   OA (osteoarthritis)    Osteoporosis    Prosthetic joint infection (HCC)    followed by dr j. hatcher ([facility])   left total shoulder arthroplasty 12/ 2016  ; 12/ 2020  revision with explant joint replacement and hemiarthroplasty;  completed 6 month antibiotic 05/ 2021   Vertigo     Past Surgical History:  Procedure Laterality Date   COLONOSCOPY     DILATATION & CURETTAGE/HYSTEROSCOPY WITH MYOSURE N/A 09/28/2019   Procedure: DILATATION & CURETTAGE/HYSTEROSCOPY WITH MYOSURE;  Surgeon: Olivia Mackie, MD;  Location: [facility];  Service: Gynecology;  Laterality: N/A;   EYE SURGERY  yrs ago   laser surgery for retinal tear.  (unilateral , pt unsure which side)   ORIF HUMERUS FRACTURE Right 04/27/2022   Procedure: OPEN REDUCTION INTERNAL FIXATION (ORIF) PROXIMAL HUMERUS FRACTURE;  Surgeon: Bjorn Pippin, MD;  Location: [facility];  Service: Orthopedics;  Laterality: Right;   PARATHYROIDECTOMY N/A 12/02/2012   Procedure: PARATHYROIDECTOMY with frozen section ;  Surgeon: Velora Heckler, MD;  Location: [facility];  Service: General;  Laterality: N/A;   REVERSE SHOULDER ARTHROPLASTY Right 04/27/2022   Procedure: REVERSE SHOULDER ARTHROPLASTY;  Surgeon: Bjorn Pippin, MD;  Location: [facility];  Service: Orthopedics;  Laterality: Right;   SHOULDER INJECTION Left 07/27/2013   Procedure: SHOULDER  INJECTION;  Surgeon: Loreta Ave, MD;  Location: [facility] OR;  Service: Orthopedics;  Laterality: Left;   STERIOD INJECTION Right 02/14/2015   Procedure: RIGHT SHOULDER CORTISONE INJECTION;  Surgeon: Loreta Ave, MD;  Location: [facility] OR;  Service: Orthopedics;  Laterality: Right;   TOTAL HIP ARTHROPLASTY Left 07/27/2013   Procedure: TOTAL HIP ARTHROPLASTY ANTERIOR APPROACH;  Surgeon: Loreta Ave, MD;  Location: [facility] OR;  Service: Orthopedics;  Laterality:  Left;   TOTAL SHOULDER ARTHROPLASTY Left 02/14/2015   Procedure: LEFT TOTAL SHOULDER ARTHROPLASTY;  Surgeon: Loreta Ave, MD;  Location: [facility] OR;  Service: Orthopedics;  Laterality: Left;   TOTAL SHOULDER REVISION Left 02/09/2019   Procedure: TOTAL SHOULDER REVISION;  Surgeon: Bjorn Pippin, MD;  Location: [facility];  Service: Orthopedics;  Laterality: Left;    Social History:   reports that she quit smoking about 35 years ago. Her smoking use included cigarettes. She started smoking about 55 years ago. She has a 20 pack-year smoking history. She has never used smokeless tobacco. She reports that she does not drink alcohol and does not use drugs.  Allergies  Allergen Reactions   Abaloparatide Other (See Comments)    "Burred vision, lightheaded" (patient does not recall this in 2024)   Mobic [Meloxicam] Hypertension   Crestor [Rosuvastatin] Other (See Comments)    Muscle aches   Norvasc [Amlodipine Besylate] Swelling and Other (See Comments)    Ankle swelling   Simvastatin Other (See Comments)    Muscle aches  Other Reaction(s): high LFTs   Sulfa Antibiotics Rash   Zetia [Ezetimibe] Other (See Comments)    Muscle aches    Family History  Problem Relation Age of Onset   Heart failure Mother    Cancer Mother    Breast cancer Mother        in her 44s   Cancer Father      Prior to Admission medications   Medication Sig Start Date End Date Taking? Authorizing Provider  acetaminophen (TYLENOL) 500 MG tablet Take 1,000 mg by mouth in the morning and at bedtime. Will take 1 additional dose if needed in the afternoon    [provider]  allopurinol (ZYLOPRIM) 300 MG tablet Take 300 mg by mouth daily.    [provider]  aspirin EC 81 MG tablet Take 1 tablet (81 mg total) by mouth daily. Swallow whole. 10/18/22   Burnadette Pop, MD  atorvastatin (LIPITOR) 10 MG tablet Take 10 mg by mouth at bedtime.     [provider]  calcium carbonate (TUMS - DOSED IN MG  ELEMENTAL CALCIUM) 500 MG chewable tablet Chew 1 tablet by mouth 3 (three) times daily as needed for indigestion or heartburn.    [provider]  carvedilol (COREG) 12.5 MG tablet Take 12.5 mg by mouth See admin instructions. Take 12.5 mg by mouth in the morning and at 5 PM 12/04/14   [provider]  cloNIDine (CATAPRES) 0.1 MG tablet Take 0.1 mg by mouth daily as needed (for hypertension- if systolic b/p is over 160).    [provider]  diclofenac Sodium (VOLTAREN) 1 % GEL Apply 2-4 g topically 4 (four) times daily as needed (to painful sites).    [provider]  furosemide (LASIX) 40 MG tablet Take 40 mg by mouth daily as needed for edema. 08/15/17   [provider]  hydrALAZINE (APRESOLINE) 25 MG tablet Take 25 mg by mouth in the morning and at bedtime.    [provider]  magnesium oxide (MAG-OX) 400 (240 Mg) MG tablet Take 1 tablet (400 mg total) by mouth daily. 10/18/22   Burnadette Pop, MD  pantoprazole (PROTONIX) 40 MG tablet Take 40 mg by mouth See admin instructions. Take 40 mg by mouth in the morning and at 5 PM- BEFORE FOOD 08/23/18   [provider]  senna-docusate (SENOKOT-S) 8.6-50 MG tablet Take 1 tablet by mouth at bedtime as needed for moderate constipation. 10/17/22   Burnadette Pop, MD    Physical Exam: Vitals:   12/22/22 1446 12/22/22 1753 12/22/22 1835 12/22/22 2105  BP: (!) 150/64 109/79  (!) 91/48  Pulse: (!) 111 94 61 72  Resp: 19 18 (!) 21 (!) 21  Temp: 97.8 F (36.6 C)  97.9 F (36.6 C)   TempSrc: Oral  Oral   SpO2: 97% 97% 90% 95%  Weight:      Height:        Constitutional: NAD, no pallor or diaphoresis  Eyes: PERTLA, lids and conjunctivae normal ENMT: Mucous membranes are dry. Posterior pharynx clear of any exudate or lesions.   Neck: supple, no masses  Respiratory: no wheezing, no crackles. No accessory muscle use.  Cardiovascular: Rate ~100 and irregularly irregular. No extremity edema.    Abdomen: No distension, no tenderness, soft. Bowel sounds active.  Musculoskeletal: no clubbing / cyanosis. No joint deformity upper and lower extremities.   Skin: no significant rashes, lesions, ulcers. Warm, dry, well-perfused. Neurologic: CN 2-12 grossly intact. Moving all extremities. Alert and oriented to person only.  Psychiatric: Calm. Cooperative.    Labs and Imaging on Admission: I have personally reviewed following labs and imaging studies  CBC: Recent Labs  Lab 12/22/22 1610  WBC 9.6  HGB 12.9  HCT 41.2  MCV 96.9  PLT 292   Basic Metabolic Panel: Recent Labs  Lab 12/22/22 1759  NA 134*  K 4.0  CL 103  CO2 20*  GLUCOSE 94  BUN 30*  CREATININE 1.57*  CALCIUM 13.1*   GFR: Estimated Creatinine Clearance: 24 mL/min (A) (by C-G formula based on SCr of 1.57 mg/dL (H)). Liver Function Tests: No results for input(s): "AST", "ALT", "ALKPHOS", "BILITOT", "PROT", "ALBUMIN" in the last 168 hours. No results for input(s): "LIPASE", "AMYLASE" in the last 168 hours. No results for input(s): "AMMONIA" in the last 168 hours. Coagulation Profile: No results for input(s): "INR", "PROTIME" in the last 168 hours. Cardiac Enzymes: No results for input(s): "CKTOTAL", "CKMB", "CKMBINDEX", "TROPONINI" in the last 168 hours. BNP (last 3 results) No results for input(s): "PROBNP" in the last 8760 hours. HbA1C: No results for input(s): "HGBA1C" in the last 72 hours. CBG: No results for input(s): "GLUCAP" in the last 168 hours. Lipid Profile: No results for input(s): "CHOL", "HDL", "LDLCALC", "TRIG", "CHOLHDL", "LDLDIRECT" in the last 72 hours. Thyroid Function Tests: No results for input(s): "TSH", "T4TOTAL", "FREET4", "T3FREE", "THYROIDAB" in the last 72 hours. Anemia Panel: No results for input(s): "VITAMINB12", "FOLATE", "FERRITIN", "TIBC", "IRON", "RETICCTPCT" in the last 72 hours. Urine analysis:    Component Value Date/Time   COLORURINE YELLOW 12/22/2022 1656    APPEARANCEUR HAZY (A) 12/22/2022 1656   LABSPEC 1.011 12/22/2022 1656   PHURINE 6.0 12/22/2022 1656   GLUCOSEU NEGATIVE 12/22/2022 1656   HGBUR NEGATIVE 12/22/2022 1656   BILIRUBINUR NEGATIVE 12/22/2022 1656   KETONESUR NEGATIVE 12/22/2022 1656   PROTEINUR 30 (A) 12/22/2022 1656   UROBILINOGEN 0.2 10/05/2013 2122   NITRITE POSITIVE (A) 12/22/2022 1656   LEUKOCYTESUR TRACE (A) 12/22/2022  1656   Sepsis Labs: @LABRCNTIP (procalcitonin:4,lacticidven:4) ) Recent Results (from the past 240 hour(s))  Blood culture (routine x 2)     Status: None (Preliminary result)   Collection Time: 12/22/22  6:34 PM   Specimen: BLOOD RIGHT HAND  Result Value Ref Range Status   Specimen Description   Final    BLOOD RIGHT HAND Performed at [facility], [address] [place], [place] [address]    Special Requests   Final    BOTTLES DRAWN AEROBIC AND ANAEROBIC Blood Culture results may not be optimal due to an inadequate volume of blood received in culture bottles Performed at [facility], [address] [place], [place] [address]    Culture PENDING  Incomplete   Report Status PENDING  Incomplete     Radiological Exams on Admission: DG Chest Portable 1 View  Result Date: 12/22/2022 CLINICAL DATA:  Sepsis EXAM: PORTABLE CHEST 1 VIEW COMPARISON:  10/14/2022 FINDINGS: Stable heart size. Aortic atherosclerosis. No focal airspace consolidation, pleural effusion, or pneumothorax. Bilateral shoulder prostheses. IMPRESSION: No active disease. Electronically Signed   By: Duanne Guess D.O.   On: 12/22/2022 17:43    EKG: Independently reviewed. Atrial fibrillation, rate 108, PVC, repolarization abnormality.   Assessment/Plan   1. Acute encephalopathy  - Likely d/t UTI, AKI, and/or electrolyte derangement  - Use delirium precautions, treat UTI, AKI, and electrolyte derangement as below   2. UTI  - Continue Rocephin, follow cultures and clinical course    3. AKI  - SCr is  1.57, up from baseline of <1  - Continue IVF hydration, renally-dose medications, repeat chem panel in am   4. Atrial fibrillation  - Appears to be new; CHADS-VASc is 72 (age x2, CVA x2, gender, DM, HTN, CHF)  - Anticoagulate with IV heparin for now while pt refusing oral medications, continue Coreg if she allows, check magnesium and TSH     5. Elevated troponin  - Troponin went from 54 to 94 in ED  - EKG with new atrial fibrillation  - ED PA discussed with cardiology who felt that this is not ACS   - Continue cardiac monitoring, trend troponin, continue ASA, Lipitor, and beta-blocker    6. Hypertension  - Continue Coreg as tolerated but hold clonidine and hydralazine initially given SBP 90s in ED   7. Hypercalcemia - Stop TUMS, continue IVF hydration, check PTH, repeat chemistry panel in am    DVT prophylaxis: IV heparin  Code Status: DNR  Level of Care: Level of care: Progressive Family Communication: Son at bedside   Disposition Plan:  Patient is from: SNF  Anticipated d/c is to: SNF Anticipated d/c date is: 12/26/22  Patient currently: Pending cultures, trend in troponin, echocardiogram, stable renal unction, improved mental status  Consults called: None  Admission status: Inpatient     Briscoe Deutscher, MD [facility]  12/22/2022, 9:27 PM

## 2022-12-22 NOTE — ED Triage Notes (Signed)
Patient BIB [facility] from [facility]. Sent out for UTI. This began 2 days ago. Staff said patient does not want to take fluids/her medication. Has dementia.

## 2022-12-22 NOTE — ED Provider Notes (Signed)
[place] [facility] AT [facility] Provider Note   CSN: [id number] Arrival date & time: 12/22/22  1426     History  Chief Complaint  Patient presents with   Altered Mental Status    Tina Frank is a 82 y.o. female with medical history of CKD stage II, diabetes, GERD, hypertension, vertigo.  Patient presents to ED for evaluation of possible sepsis, UTI.  Unable to collect much information from the patient.  Per EMS note, patient being brought in complaint subdural fluid.  She was sent for UTI.  This began 2 days ago.  Staff said the patient has not wanted take fluids or her medication and she has dementia.  Reached out to facility staff.  Nursing home staff states that the patient is typically alert x 1 with confusion.  She is currently being treated by UTI and on 5 days of ciprofloxacin 500 mg twice daily.  Facility nurse states that this morning the patient refused her medication and also became very emotional which is out of the normal for her.  She did not eat or drink anything for breakfast or lunch.  Provider at the facility assessed patient and wanted her sent out for fluids and increased confusion.  Apparently patient had culture done and grew out gram-negative bacilli.  Patient was diagnosed with UTI on the 10th.   Altered Mental Status      Home Medications Prior to Admission medications   Medication Sig Start Date End Date Taking? Authorizing Provider  acetaminophen (TYLENOL) 500 MG tablet Take 1,000 mg by mouth in the morning and at bedtime. Will take 1 additional dose if needed in the afternoon    [provider]  allopurinol (ZYLOPRIM) 300 MG tablet Take 300 mg by mouth daily.    [provider]  aspirin EC 81 MG tablet Take 1 tablet (81 mg total) by mouth daily. Swallow whole. 10/18/22   Burnadette Pop, MD  atorvastatin (LIPITOR) 10 MG tablet Take 10 mg by mouth at bedtime.     [provider]  calcium carbonate  (TUMS - DOSED IN MG ELEMENTAL CALCIUM) 500 MG chewable tablet Chew 1 tablet by mouth 3 (three) times daily as needed for indigestion or heartburn.    [provider]  carvedilol (COREG) 12.5 MG tablet Take 12.5 mg by mouth See admin instructions. Take 12.5 mg by mouth in the morning and at 5 PM 12/04/14   [provider]  cloNIDine (CATAPRES) 0.1 MG tablet Take 0.1 mg by mouth daily as needed (for hypertension- if systolic b/p is over 160).    [provider]  diclofenac Sodium (VOLTAREN) 1 % GEL Apply 2-4 g topically 4 (four) times daily as needed (to painful sites).    [provider]  furosemide (LASIX) 40 MG tablet Take 40 mg by mouth daily as needed for edema. 08/15/17   [provider]  hydrALAZINE (APRESOLINE) 25 MG tablet Take 25 mg by mouth in the morning and at bedtime.    [provider]  magnesium oxide (MAG-OX) 400 (240 Mg) MG tablet Take 1 tablet (400 mg total) by mouth daily. 10/18/22   Burnadette Pop, MD  pantoprazole (PROTONIX) 40 MG tablet Take 40 mg by mouth See admin instructions. Take 40 mg by mouth in the morning and at 5 PM- BEFORE FOOD 08/23/18   [provider]  senna-docusate (SENOKOT-S) 8.6-50 MG tablet Take 1 tablet by mouth at bedtime as needed for moderate constipation. 10/17/22  Burnadette Pop, MD      Allergies    Abaloparatide, Mobic [meloxicam], Crestor [rosuvastatin], Norvasc [amlodipine besylate], Simvastatin, Sulfa antibiotics, and Zetia [ezetimibe]    Review of Systems   Review of Systems  Unable to perform ROS: Mental status change (Level 5 caveat)  All other systems reviewed and are negative.   Physical Exam Updated Vital Signs BP (!) 91/48 (BP Location: Left Arm)   Pulse 72   Temp 97.9 F (36.6 C) (Oral)   Resp (!) 21   Ht 5\' 5"  (1.651 m)   Wt 63 kg   SpO2 95%   BMI 23.11 kg/m  Physical Exam Vitals and nursing note reviewed.  Constitutional:      General: She is not in acute  distress.    Appearance: Normal appearance. She is not ill-appearing, toxic-appearing or diaphoretic.  HENT:     Head: Normocephalic and atraumatic.     Nose: Nose normal.     Mouth/Throat:     Mouth: Mucous membranes are moist.     Pharynx: Oropharynx is clear.  Eyes:     Extraocular Movements: Extraocular movements intact.     Conjunctiva/sclera: Conjunctivae normal.     Pupils: Pupils are equal, round, and reactive to light.  Cardiovascular:     Rate and Rhythm: Tachycardia present. Rhythm irregular.  Pulmonary:     Effort: Pulmonary effort is normal.     Breath sounds: Normal breath sounds. No wheezing.  Abdominal:     General: Abdomen is flat. Bowel sounds are normal.     Palpations: Abdomen is soft.     Tenderness: There is no abdominal tenderness.  Musculoskeletal:     Cervical back: Normal range of motion and neck supple. No tenderness.     Right lower leg: No edema.     Left lower leg: No edema.  Skin:    General: Skin is warm and dry.     Capillary Refill: Capillary refill takes less than 2 seconds.  Neurological:     Mental Status: She is alert. She is disoriented.     ED Results / Procedures / Treatments   Labs (all labs ordered are listed, but only abnormal results are displayed) Labs Reviewed  URINALYSIS, ROUTINE W REFLEX MICROSCOPIC - Abnormal; Notable for the following components:      Result Value   APPearance HAZY (*)    Protein, ur 30 (*)    Nitrite POSITIVE (*)    Leukocytes,Ua TRACE (*)    Bacteria, UA MANY (*)    All other components within normal limits  BASIC METABOLIC PANEL - Abnormal; Notable for the following components:   Sodium 134 (*)    CO2 20 (*)    BUN 30 (*)    Creatinine, Ser 1.57 (*)    Calcium 13.1 (*)    GFR, Estimated 32 (*)    All other components within normal limits  I-STAT CG4 LACTIC ACID, ED - Abnormal; Notable for the following components:   Lactic Acid, Venous 2.2 (*)    All other components within normal limits   TROPONIN I (HIGH SENSITIVITY) - Abnormal; Notable for the following components:   Troponin I (High Sensitivity) 54 (*)    All other components within normal limits  TROPONIN I (HIGH SENSITIVITY) - Abnormal; Notable for the following components:   Troponin I (High Sensitivity) 94 (*)    All other components within normal limits  CULTURE, BLOOD (ROUTINE X 2)  URINE CULTURE  CBC  SODIUM, URINE, RANDOM  CREATININE, URINE, RANDOM  UREA NITROGEN, URINE  PARATHYROID HORMONE, INTACT (NO CA)  TSH  BASIC METABOLIC PANEL  MAGNESIUM  CBC  I-STAT CG4 LACTIC ACID, ED    EKG EKG Interpretation Date/Time:  Monday December 22 2022 15:47:54 EDT Ventricular Rate:  108 PR Interval:    QRS Duration:  92 QT Interval:  338 QTC Calculation: 453 R Axis:   54  Text Interpretation: Atrial fibrillation Ventricular premature complex Abnormal inferior Q waves Repol abnrm, severe global ischemia (LM/MVD) afib new since previous Confirmed by Richardean Canal [phone]) on 12/22/2022 5:19:52 PM  Radiology DG Chest Portable 1 View  Result Date: 12/22/2022 CLINICAL DATA:  Sepsis EXAM: PORTABLE CHEST 1 VIEW COMPARISON:  10/14/2022 FINDINGS: Stable heart size. Aortic atherosclerosis. No focal airspace consolidation, pleural effusion, or pneumothorax. Bilateral shoulder prostheses. IMPRESSION: No active disease. Electronically Signed   By: Duanne Guess D.O.   On: 12/22/2022 17:43    Procedures .Critical Care  Performed by: Al Decant, PA-C Authorized by: Al Decant, PA-C   Critical care provider statement:    Critical care time (minutes):  75   Critical care time was exclusive of:  Separately billable procedures and treating other patients   Critical care was necessary to treat or prevent imminent or life-threatening deterioration of the following conditions:  Sepsis   Critical care was time spent personally by me on the following activities:  Blood draw for specimens, development of  treatment plan with patient or surrogate, discussions with consultants, discussions with primary provider, evaluation of patient's response to treatment, examination of patient, interpretation of cardiac output measurements, ordering and performing treatments and interventions, ordering and review of laboratory studies, ordering and review of radiographic studies, pulse oximetry, re-evaluation of patient's condition and review of old charts   I assumed direction of critical care for this patient from another provider in my specialty: no     Care discussed with: admitting provider      Medications Ordered in ED Medications  aspirin EC tablet 81 mg (has no administration in time range)  atorvastatin (LIPITOR) tablet 10 mg (has no administration in time range)  carvedilol (COREG) tablet 12.5 mg (has no administration in time range)  pantoprazole (PROTONIX) EC tablet 40 mg (has no administration in time range)  cefTRIAXone (ROCEPHIN) 2 g in sodium chloride 0.9 % 100 mL IVPB (has no administration in time range)  sodium chloride flush (NS) 0.9 % injection 3 mL (has no administration in time range)  acetaminophen (TYLENOL) tablet 650 mg (has no administration in time range)    Or  acetaminophen (TYLENOL) suppository 650 mg (has no administration in time range)  ondansetron (ZOFRAN) tablet 4 mg (has no administration in time range)    Or  ondansetron (ZOFRAN) injection 4 mg (has no administration in time range)  0.9 %  sodium chloride infusion (has no administration in time range)  heparin bolus via infusion 3,000 Units (has no administration in time range)  heparin ADULT infusion 100 units/mL (25000 units/249mL) (has no administration in time range)  sodium chloride 0.9 % bolus 1,000 mL (0 mLs Intravenous Stopped 12/22/22 1950)  cefTRIAXone (ROCEPHIN) 2 g in sodium chloride 0.9 % 100 mL IVPB (0 g Intravenous Stopped 12/22/22 1950)  sodium chloride 0.9 % bolus 1,000 mL (1,000 mLs Intravenous New  Bag/Given 12/22/22 1951)    ED Course/ Medical Decision Making/ A&P Clinical Course as of 12/22/22 2147  Mon Dec 22, 2022  1504 Baseline is alert x1 with confusion.  Currently being treatd for UTI, on cipro for 5 days BID. This morning patient refused medication. Very emotional, out of the norm. Did not eat or drink anything for breakfast or lunch. Fluids and increased confusion. Gram negative bacilli (?) cipro started on 11th, 500mg . Diagnosed on the 10th.  [CG]  6713 82 year old female sent in from her facility for increased confusion concern for UTI.  Patient does not have any specific complaints.  She is tachycardic afebrile.  Getting labs and cultures.  Disposition per results of testing. [MB]  1930 Calcium 13.1 [CG]  1936 Hypercalcemia, AKI, new onset A fib, UTI [CG]  2140 Nate with cardiology states to trend troponins, doesn't think it is true ACS, does not think true type I, only reason to anticoag would be for a fib at this point [CG]    Clinical Course User Index [CG] Al Decant, PA-C [MB] Terrilee Files, MD   Medical Decision Making Amount and/or Complexity of Data Reviewed Labs: ordered. Radiology: ordered.  Risk Decision regarding hospitalization.   82 year old female presents to ED for evaluation.  Please see HPI for further details.  On examination patient is afebrile, tachycardic and and irregular rhythm.  Lung sounds are clear bilaterally, not hypoxic.  Abdomen soft compressible throughout.  Neuro examination baseline.  No edema bilateral lower extremities.  CBC without leukocytosis or anemia.  Metabolic panel shows sodium 134, creatinine 1.57.  Patient creatinine 2 months ago was 3.9.  The patient has AKI.  Patient calcium is also elevated to 13.1.  Anion gap 11.  Lactic acid 2.2, 1.4 on repeat check after fluids.  Patient troponin is elevated from 54 to 94.  She is in A-fib rhythm on the monitor.  Chart reviewed, patient was seen in May by Dr. Odis Hollingshead for  irregular heart rhythm.  Patient was set to wear a Zio patch monitor at that time however this never occurred per the patient's son.  Patient is not anticoagulated.  Discussed patient case with Nate, cardiology, who states that she does not believe that this is true ACS.  He states no reason for anticoagulation at this time other than the A-fib.  He does recommend he continue trending troponins until peak.  Patient urinalysis shows nitrate positive urine.  Patient require admission for urosepsis, hypercalcemia, AKI as well as an onset A-fib.  Discussed with Dr. Antionette Char who is agreed to admit the patient.   Final Clinical Impression(s) / ED Diagnoses Final diagnoses:  AKI (acute kidney injury) (HCC)  Hypercalcemia  Atrial fibrillation, unspecified type (HCC)  Acute cystitis without hematuria    Rx / DC Orders ED Discharge Orders     None         Clent Ridges 12/22/22 2147    Charlynne Pander, MD 12/22/22 2253

## 2022-12-23 DIAGNOSIS — G9340 Encephalopathy, unspecified: Secondary | ICD-10-CM | POA: Diagnosis not present

## 2022-12-23 LAB — CBC
HCT: 33.1 % — ABNORMAL LOW (ref 36.0–46.0)
Hemoglobin: 10 g/dL — ABNORMAL LOW (ref 12.0–15.0)
MCH: 29.9 pg (ref 26.0–34.0)
MCHC: 30.2 g/dL (ref 30.0–36.0)
MCV: 99.1 fL (ref 80.0–100.0)
Platelets: 264 10*3/uL (ref 150–400)
RBC: 3.34 MIL/uL — ABNORMAL LOW (ref 3.87–5.11)
RDW: 14.6 % (ref 11.5–15.5)
WBC: 8.6 10*3/uL (ref 4.0–10.5)
nRBC: 0 % (ref 0.0–0.2)

## 2022-12-23 LAB — GLUCOSE, CAPILLARY
Glucose-Capillary: 93 mg/dL (ref 70–99)
Glucose-Capillary: 96 mg/dL (ref 70–99)

## 2022-12-23 LAB — BASIC METABOLIC PANEL
Anion gap: 8 (ref 5–15)
BUN: 31 mg/dL — ABNORMAL HIGH (ref 8–23)
CO2: 21 mmol/L — ABNORMAL LOW (ref 22–32)
Calcium: 11.8 mg/dL — ABNORMAL HIGH (ref 8.9–10.3)
Chloride: 108 mmol/L (ref 98–111)
Creatinine, Ser: 1.41 mg/dL — ABNORMAL HIGH (ref 0.44–1.00)
GFR, Estimated: 37 mL/min — ABNORMAL LOW (ref >60)
Glucose, Bld: 85 mg/dL (ref 70–99)
Potassium: 3.4 mmol/L — ABNORMAL LOW (ref 3.5–5.1)
Sodium: 137 mmol/L (ref 135–145)

## 2022-12-23 LAB — TSH: TSH: 1.196 u[IU]/mL (ref 0.350–4.500)

## 2022-12-23 LAB — HEPARIN LEVEL (UNFRACTIONATED)
Heparin Unfractionated: 0.43 [IU]/mL (ref 0.30–0.70)
Heparin Unfractionated: 0.48 [IU]/mL (ref 0.30–0.70)

## 2022-12-23 LAB — MAGNESIUM: Magnesium: 2.1 mg/dL (ref 1.7–2.4)

## 2022-12-23 MED ORDER — INSULIN ASPART 100 UNIT/ML IJ SOLN
0.0000 [IU] | Freq: Three times a day (TID) | INTRAMUSCULAR | Status: DC
  Administered 2022-12-24: 1 [IU] via SUBCUTANEOUS
  Administered 2022-12-25: 3 [IU] via SUBCUTANEOUS
  Administered 2022-12-26 – 2022-12-27 (×2): 2 [IU] via SUBCUTANEOUS
  Administered 2022-12-29: 3 [IU] via SUBCUTANEOUS
  Administered 2022-12-31 – 2023-01-03 (×3): 2 [IU] via SUBCUTANEOUS
  Administered 2023-01-04: 3 [IU] via SUBCUTANEOUS
  Administered 2023-01-05: 2 [IU] via SUBCUTANEOUS
  Administered 2023-01-07 (×2): 3 [IU] via SUBCUTANEOUS
  Administered 2023-01-08: 2 [IU] via SUBCUTANEOUS

## 2022-12-23 MED ORDER — ORAL CARE MOUTH RINSE: 15.0000 mL | OROMUCOSAL | Status: DC | PRN

## 2022-12-23 MED ORDER — ORAL CARE MOUTH RINSE
15.0000 mL | OROMUCOSAL | Status: DC
  Administered 2022-12-23 – 2023-01-02 (×32): 15 mL via OROMUCOSAL

## 2022-12-23 MED ORDER — SODIUM CHLORIDE 0.9 % IV SOLN: INTRAVENOUS | Status: DC

## 2022-12-23 NOTE — Evaluation (Signed)
Clinical/Bedside Swallow Evaluation Patient Details  Name: Tina Frank MRN: [id number] Date of Birth: [birth year]  Today's Date: 12/23/2022 Time: SLP Start Time (ACUTE ONLY): 1147 SLP Stop Time (ACUTE ONLY): 1220 SLP Time Calculation (min) (ACUTE ONLY): 33 min  Past Medical History:  Past Medical History:  Diagnosis Date   CKD (chronic kidney disease), stage II    nephrologist-- dr Kathrene Bongo  [facility])   Diabetes mellitus type 2, diet-controlled (HCC)    followed by pcp---   (09-20-2019  per pt does not check blood sugar at home)   Fracture of humeral shaft, right, closed 04/25/2022   GERD (gastroesophageal reflux disease)    History of primary hyperparathyroidism    s/p  parathyroidectomy right inferior on 12-02-2012   Humerus fracture 04/25/2022   Hyperlipidemia    Hypertension    followd by nephrologist----  (09-20-2019  per pt had stress test done some time ago , unsure when/ where, thinks told ok)   OA (osteoarthritis)    Osteoporosis    Prosthetic joint infection (HCC)    followed by dr j. hatcher (ID)   left total shoulder arthroplasty 12/ 2016  ; 12/ 2020  revision with explant joint replacement and hemiarthroplasty;  completed 6 month antibiotic 05/ 2021   Vertigo    Past Surgical History:  Past Surgical History:  Procedure Laterality Date   COLONOSCOPY     DILATATION & CURETTAGE/HYSTEROSCOPY WITH MYOSURE N/A 09/28/2019   Procedure: DILATATION & CURETTAGE/HYSTEROSCOPY WITH MYOSURE;  Surgeon: Olivia Mackie, MD;  Location: [facility] [place];  Service: Gynecology;  Laterality: N/A;   EYE SURGERY  yrs ago   laser surgery for retinal tear.  (unilateral , pt unsure which side)   ORIF HUMERUS FRACTURE Right 04/27/2022   Procedure: OPEN REDUCTION INTERNAL FIXATION (ORIF) PROXIMAL HUMERUS FRACTURE;  Surgeon: Bjorn Pippin, MD;  Location: [facility];  Service: Orthopedics;  Laterality: Right;   PARATHYROIDECTOMY N/A 12/02/2012   Procedure: PARATHYROIDECTOMY  with frozen section ;  Surgeon: Velora Heckler, MD;  Location: [facility];  Service: General;  Laterality: N/A;   REVERSE SHOULDER ARTHROPLASTY Right 04/27/2022   Procedure: REVERSE SHOULDER ARTHROPLASTY;  Surgeon: Bjorn Pippin, MD;  Location: [facility];  Service: Orthopedics;  Laterality: Right;   SHOULDER INJECTION Left 07/27/2013   Procedure: SHOULDER INJECTION;  Surgeon: Loreta Ave, MD;  Location[facility];  Service: Orthopedics;  Laterality: Left;   STERIOD INJECTION Right 02/14/2015   Procedure: RIGHT SHOULDER CORTISONE INJECTION;  Surgeon: Loreta Ave, MD;  Location: [facility] OR;  Service: Orthopedics;  Laterality: Right;   TOTAL HIP ARTHROPLASTY Left 07/27/2013   Procedure: TOTAL HIP ARTHROPLASTY ANTERIOR APPROACH;  Surgeon: Loreta Ave, MD;  Location: [facility] OR;  Service: Orthopedics;  Laterality: Left;   TOTAL SHOULDER ARTHROPLASTY Left 02/14/2015   Procedure: LEFT TOTAL SHOULDER ARTHROPLASTY;  Surgeon: Loreta Ave, MD;  Location: [facility] OR;  Service: Orthopedics;  Laterality: Left;   TOTAL SHOULDER REVISION Left 02/09/2019   Procedure: TOTAL SHOULDER REVISION;  Surgeon: Bjorn Pippin, MD;  Location: [facility];  Service: Orthopedics;  Laterality: Left;   HPI:  Tina Frank is a 82 y.o. female adm with AMS, refusal to eat/drink nor take medications. Found to have metabolic encephalopathy d/t UTI and AKI with hypercalcemia. 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 CXR on this admit was negative, prior CXR suggested CHF 10/2022. Pt  was placed on nectar thick liquids clinically following her CVA in August 2024 and then was advanced to thin liquids prior to discharge from the hospital. She has undergone prior UGI testing 2019- that showed 13 mm barium tablet did not transit through the GEJ despite mutiple sips of water and monitoring for 4 minutes - but pt was not  sensate to pill lodging. Small sliding scale hiatal hernia and nod visible esophagitis noted. No family present for premorbid information.    Assessment / Plan / Recommendation  Clinical Impression  Clinical swallow evaluation completed, full report to follow.  Pt noted to be leaning to the left and inconsistently follows directions. No overt CN deficits present from oral motor tasks and po that pt would accept.  Pt presents with chronic throat clearing and occasional cough across all liquid trials including water, soda, and Ensure - and carries diagnosis of GERD that may contribute.  Pills given with icecream by RN required extra time to clear - as pt frequently allowed them to stay in oral cavity and spill to lateral channels.  She would benefit from extra time for eating and assistance as she is weak and nearly spilled her Ensure.  Recommend order softer foods that may be easier for her to masticate and encourage her to consume Ensure with meals as coughing was less frequent with Ensure. Pt appears with cognitive deficits - having difficulty communicating and following directions.  In addition, she repeatedly asked who was going to put in her IV after SLP explained it was already present.  Her cognitive deficits may allow her to spill liquids into pharynx poorly controlled.  RN reports lung sounds are clear and CXR was negative upon admit.  Will follow up briefly for indication for instrumental swallow evaluation and dysphagia management.  RN informed and provided with swallow precaution sign. SLP Visit Diagnosis: Dysphagia, unspecified (R13.10)    Aspiration Risk  Mild aspiration risk    Diet Recommendation Regular;Thin liquid (please order softer foods)    Liquid Administration via: Cup;Straw Medication Administration:  (with ice cream) Supervision: Full supervision/cueing for compensatory strategies Compensations: Minimize environmental distractions;Slow rate;Small sips/bites Postural Changes:  Seated upright at 90 degrees;Remain upright for at least 30 minutes after po intake    Other  Recommendations Oral Care Recommendations: Oral care BID    Recommendations for follow up therapy are one component of a multi-disciplinary discharge planning process, led by the attending physician.  Recommendations may be updated based on patient status, additional functional criteria and insurance authorization.  Follow up Recommendations Follow physician's recommendations for discharge plan and follow up therapies      Assistance Recommended at Discharge    Functional Status Assessment Patient has had a recent decline in their functional status and demonstrates the ability to make significant improvements in function in a reasonable and predictable amount of time.  Frequency and Duration min 1 x/week  1 week       Prognosis Prognosis for improved oropharyngeal function: Fair Barriers to Reach Goals: Cognitive deficits      Swallow Study   General Date of Onset: 12/23/22 HPI: Tina Frank is a 82 y.o. female adm with AMS, refusal to eat/drink nor take medications. Found to have metabolic encephalopathy d/t UTI and AKI with hypercalcemia. 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 CXR on this admit was negative, prior CXR suggested CHF 10/2022. Pt was placed  on nectar thick liquids clinically following her CVA in August 2024 and then was advanced to thin liquids prior to discharge from the hospital. She has undergone prior UGI testing 2019- that showed 13 mm barium tablet did not transit through the GEJ despite mutiple sips of water and monitoring for 4 minutes - but pt was not sensate to pill lodging. Small sliding scale hiatal hernia and nod visible esophagitis noted. No family present for premorbid information. Type of Study: Bedside Swallow  Evaluation Previous Swallow Assessment: prior BSE when in hospital August 2024 after CVA Diet Prior to this Study: Regular;Thin liquids (Level 0) Temperature Spikes Noted: No Respiratory Status: Nasal cannula History of Recent Intubation: No Behavior/Cognition: Alert;Confused;Cooperative Oral Cavity Assessment: Within Functional Limits Oral Care Completed by SLP: No Oral Cavity - Dentition: Edentulous Vision: Functional for self-feeding Self-Feeding Abilities: Needs assist Patient Positioning: Upright in bed Baseline Vocal Quality: Low vocal intensity Volitional Cough: Cognitively unable to elicit Volitional Swallow: Unable to elicit    Oral/Motor/Sensory Function Overall Oral Motor/Sensory Function: Within functional limits   Ice Chips Ice chips: Not tested   Thin Liquid Thin Liquid: Impaired Presentation: Cup;Straw Pharyngeal  Phase Impairments: Cough - Immediate;Cough - Delayed;Throat Clearing - Immediate    Nectar Thick Nectar Thick Liquid: Impaired Presentation: Cup;Straw Pharyngeal Phase Impairments: Cough - Delayed;Throat Clearing - Immediate;Throat Clearing - Delayed   Honey Thick Honey Thick Liquid: Not tested   Puree Puree: Within functional limits Presentation: Spoon   Solid     Solid: Impaired Oral Phase Impairments: Reduced lingual movement/coordination;Impaired mastication Oral Phase Functional Implications: Prolonged oral transit;Oral residue;Oral holding Pharyngeal Phase Impairments: Suspected delayed Swallow      Chales Abrahams 12/23/2022,4:25 PM  Rolena Infante, MS [facility] SLP Acute Rehab Services Office [phone]

## 2022-12-23 NOTE — Plan of Care (Signed)
  Problem: Activity: Goal: Ability to tolerate increased activity will improve Outcome: Progressing   Problem: Nutrition: Goal: Risk of aspiration will decrease Outcome: Progressing   Problem: Activity: Goal: Risk for activity intolerance will decrease Outcome: Progressing   Problem: Coping: Goal: Level of anxiety will decrease Outcome: Progressing   Problem: Safety: Goal: Ability to remain free from injury will improve Outcome: Progressing   Problem: Skin Integrity: Goal: Risk for impaired skin integrity will decrease Outcome: Progressing

## 2022-12-23 NOTE — Plan of Care (Signed)
Problem: Clinical Measurements: Goal: Diagnostic test results will improve Outcome: Progressing   Problem: Activity: Goal: Risk for activity intolerance will decrease Outcome: Progressing   Problem: Nutrition: Goal: Adequate nutrition will be maintained Outcome: Progressing   Problem: Safety: Goal: Ability to remain free from injury will improve Outcome: Progressing   Problem: Skin Integrity: Goal: Risk for impaired skin integrity will decrease Outcome: Progressing

## 2022-12-23 NOTE — Progress Notes (Signed)
PHARMACY - ANTICOAGULATION CONSULT NOTE  Pharmacy Consult for Heparin Indication: atrial fibrillation  Allergies  Allergen Reactions   Abaloparatide Other (See Comments)    "Burred vision, lightheaded" (patient does not recall this in 2024)   Mobic [Meloxicam] Hypertension   Crestor [Rosuvastatin] Other (See Comments)    Muscle aches   Norvasc [Amlodipine Besylate] Swelling and Other (See Comments)    Ankle swelling   Simvastatin Other (See Comments)    Muscle aches  Other Reaction(s): high LFTs   Sulfa Antibiotics Rash   Zetia [Ezetimibe] Other (See Comments)    Muscle aches    Patient Measurements: Height: 5\' 5"  (165.1 cm) Weight: 53.6 kg (118 lb 2.7 oz) IBW/kg (Calculated) : 57 Heparin Dosing Weight:  53.6 kg  Vital Signs: Temp: 99.1 F (37.3 C) (10/15 1141) Temp Source: Oral (10/15 1141) BP: 148/53 (10/15 1141) Pulse Rate: 57 (10/15 1141)  Labs: Recent Labs    12/22/22 1610 12/22/22 1742 12/22/22 1759 12/22/22 2000 12/22/22 2249 12/23/22 0606 12/23/22 0751 12/23/22 1357  HGB 12.9  --   --   --   --  10.0*  --   --   HCT 41.2  --   --   --   --  33.1*  --   --   PLT 292  --   --   --   --  264  --   --   HEPARINUNFRC  --   --   --   --   --   --  0.43 0.48  CREATININE  --   --  1.57*  --   --  1.41*  --   --   TROPONINIHS  --  54*  --  94* 97*  --   --   --     Estimated Creatinine Clearance: 25.1 mL/min (A) (by C-G formula based on SCr of 1.41 mg/dL (H)).   Medical History: Past Medical History:  Diagnosis Date   CKD (chronic kidney disease), stage II    nephrologist-- dr Kathrene Bongo  [facility])   Diabetes mellitus type 2, diet-controlled (HCC)    followed by pcp---   (09-20-2019  per pt does not check blood sugar at home)   Fracture of humeral shaft, right, closed 04/25/2022   GERD (gastroesophageal reflux disease)    History of primary hyperparathyroidism    s/p  parathyroidectomy right inferior on 12-02-2012   Humerus fracture  04/25/2022   Hyperlipidemia    Hypertension    followd by nephrologist----  (09-20-2019  per pt had stress test done some time ago , unsure when/ where, thinks told ok)   OA (osteoarthritis)    Osteoporosis    Prosthetic joint infection (HCC)    followed by dr j. hatcher (ID)   left total shoulder arthroplasty 12/ 2016  ; 12/ 2020  revision with explant joint replacement and hemiarthroplasty;  completed 6 month antibiotic 05/ 2021   Vertigo     Assessment: AC/Heme: Afib, CVA 10/2022.CHADS-VASc is 8  - Hgb 12.9>10, Plts 264 WNL - Hep level 0.43 in goal.   PM update - Confirmatory heparin level is 0.48, therapeutic - No line or bleeding issues per RN   Goal of Therapy:  Heparin level 0.3-0.7 units/ml Monitor platelets by anticoagulation protocol: Yes   Plan:  Continue IV heparin at  900 units/hr. Daily HL and CBC Monitor for signs and symptoms of bleeding  Adalberto Cole, PharmD, BCPS 12/23/2022 4:29 PM

## 2022-12-23 NOTE — Progress Notes (Signed)
PHARMACY - ANTICOAGULATION CONSULT NOTE  Pharmacy Consult for Heparin Indication: atrial fibrillation  Allergies  Allergen Reactions   Abaloparatide Other (See Comments)    "Burred vision, lightheaded" (patient does not recall this in 2024)   Mobic [Meloxicam] Hypertension   Crestor [Rosuvastatin] Other (See Comments)    Muscle aches   Norvasc [Amlodipine Besylate] Swelling and Other (See Comments)    Ankle swelling   Simvastatin Other (See Comments)    Muscle aches  Other Reaction(s): high LFTs   Sulfa Antibiotics Rash   Zetia [Ezetimibe] Other (See Comments)    Muscle aches    Patient Measurements: Height: 5\' 5"  (165.1 cm) Weight: 53.6 kg (118 lb 2.7 oz) IBW/kg (Calculated) : 57 Heparin Dosing Weight:  53.6 kg  Vital Signs: Temp: 98.5 F (36.9 C) (10/15 0628) Temp Source: Oral (10/15 0628) BP: 154/62 (10/15 0628) Pulse Rate: 68 (10/15 0628)  Labs: Recent Labs    12/22/22 1610 12/22/22 1742 12/22/22 1759 12/22/22 2000 12/22/22 2249 12/23/22 0606 12/23/22 0751  HGB 12.9  --   --   --   --  10.0*  --   HCT 41.2  --   --   --   --  33.1*  --   PLT 292  --   --   --   --  264  --   HEPARINUNFRC  --   --   --   --   --   --  0.43  CREATININE  --   --  1.57*  --   --  1.41*  --   TROPONINIHS  --  54*  --  94* 97*  --   --     Estimated Creatinine Clearance: 25.1 mL/min (A) (by C-G formula based on SCr of 1.41 mg/dL (H)).   Medical History: Past Medical History:  Diagnosis Date   CKD (chronic kidney disease), stage II    nephrologist-- dr Kathrene Bongo  [facility])   Diabetes mellitus type 2, diet-controlled (HCC)    followed by pcp---   (09-20-2019  per pt does not check blood sugar at home)   Fracture of humeral shaft, right, closed 04/25/2022   GERD (gastroesophageal reflux disease)    History of primary hyperparathyroidism    s/p  parathyroidectomy right inferior on 12-02-2012   Humerus fracture 04/25/2022   Hyperlipidemia    Hypertension     followd by nephrologist----  (09-20-2019  per pt had stress test done some time ago , unsure when/ where, thinks told ok)   OA (osteoarthritis)    Osteoporosis    Prosthetic joint infection (HCC)    followed by dr j. hatcher (ID)   left total shoulder arthroplasty 12/ 2016  ; 12/ 2020  revision with explant joint replacement and hemiarthroplasty;  completed 6 month antibiotic 05/ 2021   Vertigo     Assessment: AC/Heme: Afib, CVA 10/2022.CHADS-VASc is 8  - Hgb 12.9>10, Plts 264 WNL - Hep level 0.43 in goal.   Goal of Therapy:  Heparin level 0.3-0.7 units/ml Monitor platelets by anticoagulation protocol: Yes   Plan:  Con't IV heparin 900 units/hr Confirm therapeutic level in 6 hrs. Daily HL and CBC   Shakita Keir S. Merilynn Finland, PharmD, BCPS Clinical Staff Pharmacist [email or web address]  Merilynn Finland, [employer] 12/23/2022,8:58 AM

## 2022-12-23 NOTE — Progress Notes (Signed)
Clinical swallow evaluation completed, full report to follow.  Pt noted to be leaning to the left and inconsistently follows directions. No overt CN deficits present from oral motor tasks and po that pt would accept.  Pt presents with chronic throat clearing and occasional cough across all liquid trials including water, soda, and Ensure - and carries diagnosis of GERD that may contribute.  Pills given with icecream by RN required extra time to clear - as pt frequently allowed them to stay in oral cavity and spill to lateral channels.  She would benefit from extra time for eating and assistance as she is weak and nearly spilled her Ensure.  Recommend order softer foods that may be easier for her to masticate and encourage her to consume Ensure with meals as coughing was less frequent with Ensure. Pt appears with cognitive deficits - having difficulty communicating and following directions.  In addition, she repeatedly asked who was going to put in her IV after SLP explained it was already present.  Her cognitive deficits may allow her to spill liquids into pharynx poorly controlled.  RN reports lung sounds are clear and CXR was negative upon admit.  Will follow up briefly for indication for instrumental swallow evaluation and dysphagia management.  RN informed and provided with swallow precaution sign.    Rolena Infante, MS [facility] SLP [facility] [employer] [phone]

## 2022-12-23 NOTE — Progress Notes (Signed)
PROGRESS NOTE    Tina Frank  EAV:[id number] DOB: [birth year] DOA: 12/22/2022 PCP: Sigmund Hazel, MD   Brief Narrative: Tina Frank is a 82 y.o. female with a history of diabetes mellitus type 2, hyperparathyroidism s/p parathyroidectomy, primary hypertension, chronic diastolic heart failure, CVA .  Patient presented secondary to worsening confusion and refusal to eat, drink or take medication and found to have acute metabolic encephalopathy secondary to UTI and complicated by associated AKI and hypercalcemia. Ceftriaxone started empirically. Urine culture is pending.   Assessment and Plan:  Acute metabolic encephalopathy Presumed secondary to UTI, but could also be a component from hypercalcemia. Encephalopathy appears to be improved today. -Continue to treat infection and hypercalcemia  UTI Urinalysis is suggestive of infection. Urine culture obtained. Empiric Ceftriaxone started. -Continue Ceftriaxone -Follow-up urine culture  AKI Baseline creatinine of 0.9. Creatinine of 1.57 on admission. IV fluids started. Creatinine improved to 1.41 today. -Continue IV fluids  New onset atrial fibrillation Patient started on heparin IV. No echocardiogram obtained this admission, however patient has had a recent Transthoracic Echocardiogram from 10/13/2022 that was significant for an LVEF of 60-65% with grade 1 diastolic dysfunction and mildly dilated left atrium. Patient has a history of bradyarrhythmia for which she was sent to cardiology in May 2024.  Elevated troponin Troponin elevated from 54 > 94 > 97. No associated chest pain. Cardiology called per chart review with no concern for ACS. Patient remains asymptomatic.  Primary hypertension Patient is on clonidine, hydralazine as an outpatient which were held on admission.  Hypercalcemia Possibly iatrogenic as patient is on Tums as an outpatient however, patient has a history of borderline elevated calcium and on continued review,  it appears she has a history of hyperparathyroidism with history of parathyroidectomy in 2014. Calcium of 13.1 on admission. IV fluids started. Calcium improved to 11.8 today. -Continue IV fluids -Follow-up PTH  Diabetes mellitus type 2 Diet controlled. Well controlled. Last hemoglobin A1C of 6.0%. -SSI   DVT prophylaxis: Heparin IV Code Status:   Code Status: Limited: Do not attempt resuscitation (DNR) -DNR-LIMITED -Do Not Intubate/DNI  Family Communication: None at bedside Disposition Plan: Discharge back to SNF likely in 1-2 days pending transition to outpatient antibiotics, continued improvement in encephalopathy, improvement in serum calcium   Consultants:  None  Procedures:  None  Antimicrobials: None    Subjective: Patient reports no concerns this morning.  Objective: BP (!) 148/53 (BP Location: Right Arm)   Pulse (!) 57   Temp 99.1 F (37.3 C) (Oral)   Resp 17   Ht 5\' 5"  (1.651 m)   Wt 53.6 kg   SpO2 96%   BMI 19.66 kg/m   Examination:  General exam: Appears calm and comfortable Respiratory system: Clear to auscultation. Respiratory effort normal. Cardiovascular system: S1 & S2 heard. Gastrointestinal system: Abdomen is nondistended, soft and nontender. Normal bowel sounds heard. Central nervous system: Alert and oriented to person place and year. Musculoskeletal: No edema. No calf tenderness Skin: No cyanosis. No rashes Psychiatry: Judgement and insight appear normal. Mood & affect appropriate.    Data Reviewed: I have personally reviewed following labs and imaging studies  CBC Lab Results  Component Value Date   WBC 8.6 12/23/2022   RBC 3.34 (L) 12/23/2022   HGB 10.0 (L) 12/23/2022   HCT 33.1 (L) 12/23/2022   MCV 99.1 12/23/2022   MCH 29.9 12/23/2022   PLT 264 12/23/2022   MCHC 30.2 12/23/2022   RDW 14.6 12/23/2022   LYMPHSABS 1.3  10/12/2022   MONOABS 0.9 10/12/2022   EOSABS 0.2 10/12/2022   BASOSABS 0.1 10/12/2022     Last metabolic  panel Lab Results  Component Value Date   NA 137 12/23/2022   K 3.4 (L) 12/23/2022   CL 108 12/23/2022   CO2 21 (L) 12/23/2022   BUN 31 (H) 12/23/2022   CREATININE 1.41 (H) 12/23/2022   GLUCOSE 85 12/23/2022   GFRNONAA 37 (L) 12/23/2022   GFRAA >60 04/11/2019   CALCIUM 11.8 (H) 12/23/2022   PHOS 2.6 10/16/2022   PROT 5.1 (L) 10/13/2022   ALBUMIN 2.6 (L) 10/13/2022   LABGLOB 2.4 10/13/2022   AGRATIO 1.3 10/13/2022   BILITOT 1.2 10/13/2022   ALKPHOS 56 10/13/2022   AST 14 (L) 10/13/2022   ALT 10 10/13/2022   ANIONGAP 8 12/23/2022    GFR: Estimated Creatinine Clearance: 25.1 mL/min (A) (by C-G formula based on SCr of 1.41 mg/dL (H)).  Recent Results (from the past 240 hour(s))  Blood culture (routine x 2)     Status: None (Preliminary result)   Collection Time: 12/22/22  6:34 PM   Specimen: BLOOD RIGHT HAND  Result Value Ref Range Status   Specimen Description   Final    BLOOD RIGHT HAND Performed at [facility], [address], [place], [place] [address]    Special Requests   Final    BOTTLES DRAWN AEROBIC AND ANAEROBIC Blood Culture results may not be optimal due to an inadequate volume of blood received in culture bottles Performed at [facility], [address] [place], [place] [address]    Culture   Final    NO GROWTH < 24 HOURS Performed at [facility], [address], [place], [place] [address]    Report Status PENDING  Incomplete      Radiology Studies: DG Chest Portable 1 View  Result Date: 12/22/2022 CLINICAL DATA:  Sepsis EXAM: PORTABLE CHEST 1 VIEW COMPARISON:  10/14/2022 FINDINGS: Stable heart size. Aortic atherosclerosis. No focal airspace consolidation, pleural effusion, or pneumothorax. Bilateral shoulder prostheses. IMPRESSION: No active disease. Electronically Signed   By: Duanne Guess D.O.   On: 12/22/2022 17:43      LOS: 1 day    Jacquelin Hawking, MD [facility] 12/23/2022, 1:41 PM   If 7PM-7AM,  please contact night-coverage [email or web address]

## 2022-12-23 NOTE — Hospital Course (Addendum)
Tina Frank is a 82 y.o. female with a history of diabetes mellitus type 2, hyperparathyroidism s/p parathyroidectomy, primary hypertension, chronic diastolic heart failure, CVA .  Patient presented secondary to worsening confusion and refusal to eat, drink or take medication and found to have acute metabolic encephalopathy secondary to UTI and complicated by associated AKI and hypercalcemia.  Subsequently she improved and ended up getting a bone marrow biopsy to evaluate for her hypercalcemia.  She is stable and can be followed in outpatient setting with PCP, urology, nephrology, hematology and medical oncology as well as cardiology.  She will need continued strength training at SNF and follow-up on her bone marrow biopsy that was done 01/06/23.   Assessment and Plan:  Acute Metabolic Encephalopathy, improved  -Felt presumed secondary to UTI in the setting of hypercalcemia. -Patient noted to have some improvement with mental status however some concerns for waxing and waning as he became quite confused again on 12/28/2022.??  Sundowning. -Improving slowly clinically and appears baseline. -TSH 1.499, free T41.46, vitamin B12 of 362, vitamin B1 100.6.  Ammonia level < 10. -MRI brain with no acute intracranial process, no evidence of acute or subacute infarct noted. -EEG done with mild to moderate diffuse encephalopathy, no seizures or epileptiform discharges were seen throughout the recording. -Continued supportive care with IV fluids but now IVF stopped. -PT/OT recommending SNF and and stable for D/C   Hypercalcemia History of primary hyperparathyroidism SP parathyroidectomy in 2014 ??  Etiology of hypercalcemia. -Ca2+ Trend: Recent Labs  Lab 01/02/23 0413 01/03/23 0440 01/04/23 0447 01/05/23 0424 01/06/23 0926 01/07/23 0425 01/08/23 0413  CALCIUM 11.1* 10.7* 11.5* 10.6* 9.9 9.2 9.1  -May be secondary to exogenous calcium from home calcium and vitamin D supplementation. -PTH at 14  appropriately suppressed, calcitriol levels noted low, vitamin D 25 hydroxy within normal limits.  TSH within normal limits. -PTH RP was <2.0, UPEP pending. -Kappa lambda light chain ratio elevated at 2.64. 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 -Patient seen in consultation by nephrology, placed on IV fluids with calcium level slowly trending down. -Corrected calcium at 10.1. -Nephrology recommending holding off on calcitonin and oral bisphosphonates early on in the hospitalization.  -Nephrology recommended input from hematology/oncology and as such hematology/oncology consulted. -Per Hematology positive M protein spike has slightly elevated serum kappa light chain levels are not impressive and suspicion for multiple myeloma per hematology not extremely high. -Hematology oncology recommended bone marrow biopsy for further evaluation which was done on 01/06/2023.  -CT abdomen and pelvis recommended by hematology/oncology negative for malignancy or metastatic disease however does note 1.9 x 1.6 cm lesion in the left kidney concerning for renal malignancy.  -Iron studies also ordered by hematology/oncology with a iron level of 30, TIBC of 210, ferritin of 76, folate of 11.7,.  Vitamin B12 of 362. -Multiple myeloma panel pending. -Nephrology in agreement with hematology for 1 dose of Zometa 4 mg which was given 01/04/2023.  -Calcium levels continue to trend down. -Will need outpatient follow-up with endocrinology. -Hematology/oncology, nephrology following and appreciate their input and recommendations. -Will not resume calcium and vitamin D supplementation on discharge. -Will also follow-up with Hematology/Oncology outpatient setting in 1 week.   Electrolyte Abnormalities Hypokalemia/Hypomagnesemia/Hypophosphatemia Recent Labs  Lab 01/02/23 0413 01/03/23 0440 01/04/23 0447 01/05/23 0424 01/06/23 0926  01/07/23 0425 01/08/23 0413  K 3.1* 3.3* 3.5 3.4* 3.6 3.6 3.1*  MG 1.8 1.9 1.8  1.8 1.9 2.2 2.4  PHOS  --  2.8 3.4 3.0 2.0* 2.6 2.2*  -Replete with p.o. Potassium Chloride 40 mEQ x 1 and IV K-Phos 20 mmol -Continue to Monitor and Trend and repeat CMP, mag, Phos within 1 week   E. coli UTI -Was on IV Rocephin, transitioned to [facility] and completed 7-day course of treatment. -No further antibiotics needed.  AKI -Felt likely secondary to a prerenal azotemia in the setting of dehydration and hypercalcemia. -BUN/Cr Trend: Recent Labs  Lab 01/02/23 0413 01/03/23 0440 01/04/23 0447 01/05/23 0424 01/06/23 0926 01/07/23 0425 01/08/23 0413  BUN 12 10 19 19 14 13 13   CREATININE 0.63 0.64 0.72 0.71 0.62 0.65 0.61  -Renal function improved with hydration and was on NS and now stopped and currently resolved. -Saline lock IV fluids.   -Nephrology was following but has signed off.   -Avoid Nephrotoxic Medications, Contrast Dyes, Hypotension and Dehydration to Ensure Adequate Renal Perfusion and will need to Renally Adjust Meds -Continue to Monitor and Trend Renal Function carefully and repeat CMP within 1 week  New onset Atrial Fibrillation -2D echo from 10/13/2022 with a EF of 60 to 65%, grade 1 DD and mildly dilated left atrium. -Patient with history of bradycardia arrhythmia for which she was sent to cardiology in May 2024. -Patient felt not a great candidate for long-term anticoagulation but fall risk now is lower due to debility per son. -Patient placed on Xarelto which is preferred medication per family. -Patient has been placed on full dose Lovenox in anticipation of bone marrow biopsy which was done 01/06/2023.   -Resuming Rivaroxaban 01/07/2023.    Elevated Troponin -Currently asymptomatic. -Prior physician spoke with cardiology who reviewed the chart and felt no concerns for ACS.  Patient currently asymptomatic. -2D echo with normal EF, no significant valvular abnormality. -Follow  up with Cardiology in the outpatient setting    Essential Hypertension -Patient noted to have been on clonidine, hydralazine in the outpatient setting which were held on admission. -BP still elevated and as such hydralazine increased to 100 mg 3 times daily. -Status post Lasix 20 mg IV x 1 on 01/03/2023. -Continue home regimen as needed Clonidine 0.1 mg po Dailyprn  -BP currently controlled on current regimen of hydralazine. -If further blood pressure control is needed will start Amlodipine. -Last BP reading was 142/63   Diabetes Mellitus Type 2 -Hemoglobin A1c 6.0 (10/13/2022) -CBG Trend: Recent Labs  Lab 01/06/23 2127 01/07/23 0734 01/07/23 1126 01/07/23 1610 01/07/23 2017 01/08/23 0738 01/08/23 1113  GLUCAP 123* 102* 154* 158* 113* 118* 140*  -C/w Moderate Novolog SSI AC while hospitalized  Normocytic Anemia -Hgb/Hct Trend: Recent Labs  Lab 01/02/23 0413 01/03/23 0440 01/04/23 0447 01/05/23 0424 01/06/23 0926 01/07/23 0425 01/08/23 0413  HGB 9.2* 9.6* 9.1* 8.8* 8.7* 8.8* 9.2*  HCT 28.7* 30.4* 28.0* 27.4* 28.0* 29.3* 29.3*  MCV 93.2 93.8 94.0 94.2 95.6 99.3 96.7  -Check Anemia Panel in the outpatient setting within 1 week -Continue to Monitor for S/Sx of Bleeding; No overt bleeding noted -Repeat CBC within 1 week  Renal Mass versus numerous simple cysts Hydronephrosis Urinary retention -Noted on CT abdomen and pelvis. -1.9 x 1.6 cm lesion in the left kidney noted concerning for renal cell malignancy.  CT also concerning for mild bilateral hydronephrosis and hydroureter.  To the uterovesical junctions without calculus or other obstruction visualized.  Distended urinary bladder measuring up to 15.1 cm findings consistent with urinary retention and hydronephrosis secondary to back pressure. -Patient noted with  a urine output of 900 over the past 24 hours, urine output not accurately recorded.. -Renal function currently stable. -Patient with minimal PVR. -Continue  Tamsulosin, continue with bladder scans.  -Patient constipated and as such patient placed on bowel regimen which may be causing urinary retention per urology recommendations.  Her most recent postvoid residual is 0 mL and there is no signs of obstruction and they feel that her hydronephrosis was mild enough to where they think that they do not need to repeat her ultrasound -Hematology/oncology to follow-up on lesion on left kidney in the outpatient setting and likely would obtain an MRI for further evaluation. -Urology also to follow-up on kidney lesions in the outpatient setting. -Urology was following and has now signed off at this time as they feel the 0.9 x 1.6 cm lesion on the superior left kidney and a 3 x 8 x 3.3 cm complex appearing cyst on the right kidney will be need to followed up in outpatient basis  ? Vertigo -Patient stated on 01/01/2023, whenever she turns her head has a brief episode of spinning sensation which subsequently resolves on its own. -Patient does endorse a prior history of vertigo. -Clinically she is improved as symptoms have resolved so will providing Meclizine 12/5 mg po TID as needed if the symptoms recur

## 2022-12-24 ENCOUNTER — Inpatient Hospital Stay (HOSPITAL_COMMUNITY): Payer: Medicare PPO

## 2022-12-24 DIAGNOSIS — I4891 Unspecified atrial fibrillation: Secondary | ICD-10-CM | POA: Diagnosis not present

## 2022-12-24 DIAGNOSIS — G9340 Encephalopathy, unspecified: Secondary | ICD-10-CM | POA: Diagnosis not present

## 2022-12-24 LAB — GLUCOSE, CAPILLARY
Glucose-Capillary: 91 mg/dL (ref 70–99)
Glucose-Capillary: 103 mg/dL — ABNORMAL HIGH (ref 70–99)
Glucose-Capillary: 133 mg/dL — ABNORMAL HIGH (ref 70–99)
Glucose-Capillary: 130 mg/dL — ABNORMAL HIGH (ref 70–99)

## 2022-12-24 LAB — CBC
HCT: 29 % — ABNORMAL LOW (ref 36.0–46.0)
Hemoglobin: 9.2 g/dL — ABNORMAL LOW (ref 12.0–15.0)
MCH: 30.3 pg (ref 26.0–34.0)
MCHC: 31.7 g/dL (ref 30.0–36.0)
MCV: 95.4 fL (ref 80.0–100.0)
Platelets: 198 10*3/uL (ref 150–400)
RBC: 3.04 MIL/uL — ABNORMAL LOW (ref 3.87–5.11)
RDW: 14.6 % (ref 11.5–15.5)
WBC: 7.3 10*3/uL (ref 4.0–10.5)
nRBC: 0 % (ref 0.0–0.2)

## 2022-12-24 LAB — UREA NITROGEN, URINE: Urea Nitrogen, Ur: 368 mg/dL

## 2022-12-24 LAB — RENAL FUNCTION PANEL
Albumin: 2.7 g/dL — ABNORMAL LOW (ref 3.5–5.0)
Anion gap: 7 (ref 5–15)
BUN: 29 mg/dL — ABNORMAL HIGH (ref 8–23)
CO2: 23 mmol/L (ref 22–32)
Calcium: 11.1 mg/dL — ABNORMAL HIGH (ref 8.9–10.3)
Chloride: 105 mmol/L (ref 98–111)
Creatinine, Ser: 1.11 mg/dL — ABNORMAL HIGH (ref 0.44–1.00)
GFR, Estimated: 49 mL/min — ABNORMAL LOW (ref >60)
Glucose, Bld: 93 mg/dL (ref 70–99)
Phosphorus: 2.6 mg/dL (ref 2.5–4.6)
Potassium: 3.1 mmol/L — ABNORMAL LOW (ref 3.5–5.1)
Sodium: 135 mmol/L (ref 135–145)

## 2022-12-24 LAB — ECHOCARDIOGRAM LIMITED
Height: 65 in
S' Lateral: 2.5 cm
Weight: 2011.2 [oz_av]

## 2022-12-24 LAB — PARATHYROID HORMONE, INTACT (NO CA): PTH: 14 pg/mL — ABNORMAL LOW (ref 15–65)

## 2022-12-24 LAB — HEPARIN LEVEL (UNFRACTIONATED): Heparin Unfractionated: 0.39 [IU]/mL (ref 0.30–0.70)

## 2022-12-24 MED ORDER — SODIUM CHLORIDE 0.9 % IV SOLN: INTRAVENOUS | Status: AC

## 2022-12-24 MED ORDER — POTASSIUM CHLORIDE CRYS ER 20 MEQ PO TBCR
40.0000 meq | EXTENDED_RELEASE_TABLET | ORAL | Status: AC
  Administered 2022-12-24 (×2): 40 meq via ORAL
  Filled 2022-12-24 (×2): qty 2

## 2022-12-24 NOTE — Progress Notes (Signed)
  Echocardiogram 2D Echocardiogram has been performed.  Tina Frank 12/24/2022, 3:14 PM

## 2022-12-24 NOTE — TOC Initial Note (Signed)
Transition of Care [facility]) - Initial/Assessment Note    Patient Details  Name: Tina Frank MRN: [id number] Date of Birth: 05/10/1940  Transition of Care [facility]) CM/SW Contact:    Larrie Kass, LCSW Phone Number: 12/24/2022, 10:47 AM  Clinical Narrative:                 Pt is from [facility] short term rehab resident. Spoke with Florentina Addison in admission , pt can return but will need insurance authorization. TOC to follow.   Expected Discharge Plan:  (TBD) Barriers to Discharge: Continued Medical Work up   Patient Goals and CMS Choice            Expected Discharge Plan and Services                                              Prior Living Arrangements/Services                       Activities of Daily Living   ADL Screening (condition at time of admission) Independently performs ADLs?: No Does the patient have a NEW difficulty with bathing/dressing/toileting/self-feeding that is expected to last >3 days?: No Does the patient have a NEW difficulty with getting in/out of bed, walking, or climbing stairs that is expected to last >3 days?: No Does the patient have a NEW difficulty with communication that is expected to last >3 days?: No Is the patient deaf or have difficulty hearing?: Yes Does the patient have difficulty seeing, even when wearing glasses/contacts?: No Does the patient have difficulty concentrating, remembering, or making decisions?: Yes  Permission Sought/Granted                  Emotional Assessment              Admission diagnosis:  Hypercalcemia [E83.52] Acute cystitis without hematuria [N30.00] Acute encephalopathy [G93.40] AKI (acute kidney injury) (HCC) [N17.9] Atrial fibrillation, unspecified type (HCC) [I48.91] Patient Active Problem List   Diagnosis Date Noted   Acute encephalopathy 12/22/2022   UTI (urinary tract infection) 12/22/2022   AKI (acute kidney injury) (HCC) 12/22/2022   Atrial fibrillation  (HCC) 12/22/2022   Elevated troponin 12/22/2022   Chronic heart failure with preserved ejection fraction (HFpEF) (HCC) 12/22/2022   IDA (iron deficiency anemia) 12/09/2022   Slow transit constipation 11/26/2022   Pulmonary edema 11/26/2022   History of stroke 10/12/2022   Hypercalcemia 10/12/2022   Hypomagnesemia 10/12/2022   Positive anti-CCP test 10/12/2022   Bradycardia 10/12/2022   DNR (do not resuscitate)/DNI(Do Not Intubate) 10/12/2022   Arthritis of left ankle 08/15/2020   Benign neoplasm of adrenal gland 08/15/2020   Cyst of kidney, acquired 08/15/2020   Gastroesophageal reflux disease 08/15/2020   Hardening of the aorta (main artery of the heart) (HCC) 08/15/2020   History of primary hyperparathyroidism 08/15/2020   Gout 08/15/2020   Joint pain 08/15/2020   Oral phase dysphagia 08/15/2020   Osteoporosis 08/15/2020   Prediabetes 08/15/2020   Presence of left artificial shoulder joint 08/15/2020   Pure hypercholesterolemia 08/15/2020   Visual disturbance 08/15/2020   Vitamin D deficiency 08/15/2020   Voice hoarseness 08/15/2020   Chronic arthropathy 04/25/2020   Decubitus skin ulcer 04/12/2019   Hypokalemia 03/15/2019   Prosthetic joint infection (HCC) 02/09/2019   Degenerative joint disease of left shoulder 02/14/2015   Pre-syncope 10/06/2013  Leukocytosis 10/06/2013   Hypotension 10/06/2013   Osteoarthritis 07/27/2013   Essential hypertension 10/28/2011   DM (diabetes mellitus) (HCC) 10/28/2011   Hyperlipidemia 10/28/2011   Reflux esophagitis    Herpes zoster    Shingles    PCP:  Sigmund Hazel, MD Pharmacy:   [facility] [phone] [place], [place] - [address] AT [facility] [address] & [facility] [address] [place] [address] Phone: [phone] Fax: [phone]     Social Determinants of Health (SDOH) Social History: SDOH Screenings   Food Insecurity: No Food Insecurity (12/22/2022)  Housing: Low Risk  (12/22/2022)   Transportation Needs: No Transportation Needs (12/22/2022)  Utilities: Not At Risk (12/22/2022)  Depression (PHQ2-9): Low Risk  (07/19/2019)  Tobacco Use: Medium Risk (12/22/2022)   SDOH Interventions:     Readmission Risk Interventions    04/28/2022    3:15 PM  Readmission Risk Prevention Plan  Transportation Screening Complete  PCP or Specialist Appt within 5-7 Days Complete  Home Care Screening Complete  Medication Review (RN CM) Complete

## 2022-12-24 NOTE — Progress Notes (Signed)
PHARMACY - ANTICOAGULATION CONSULT NOTE  Pharmacy Consult for Heparin Indication: atrial fibrillation  Allergies  Allergen Reactions   Abaloparatide Other (See Comments)    "Burred vision, lightheaded" (patient does not recall this in 2024)   Mobic [Meloxicam] Hypertension   Crestor [Rosuvastatin] Other (See Comments)    Muscle aches   Norvasc [Amlodipine Besylate] Swelling and Other (See Comments)    Ankle swelling   Simvastatin Other (See Comments)    Muscle aches  Other Reaction(s): high LFTs   Sulfa Antibiotics Rash   Zetia [Ezetimibe] Other (See Comments)    Muscle aches    Patient Measurements: Height: 5\' 5"  (165.1 cm) Weight: 57 kg (125 lb 11.2 oz) IBW/kg (Calculated) : 57 Heparin Dosing Weight: TBW  Vital Signs: Temp: 98 F (36.7 C) (10/16 0622) Temp Source: Oral (10/16 0622) BP: 146/67 (10/16 0622) Pulse Rate: 88 (10/16 0622)  Labs: Recent Labs    12/22/22 1610 12/22/22 1742 12/22/22 1759 12/22/22 2000 12/22/22 2249 12/23/22 0606 12/23/22 0751 12/23/22 1357 12/24/22 0347  HGB 12.9  --   --   --   --  10.0*  --   --  9.2*  HCT 41.2  --   --   --   --  33.1*  --   --  29.0*  PLT 292  --   --   --   --  264  --   --  198  HEPARINUNFRC  --   --   --   --   --   --  0.43 0.48 0.39  CREATININE  --   --  1.57*  --   --  1.41*  --   --  1.11*  TROPONINIHS  --  54*  --  94* 97*  --   --   --   --     Estimated Creatinine Clearance: 33.9 mL/min (A) (by C-G formula based on SCr of 1.11 mg/dL (H)).   Medical History: Past Medical History:  Diagnosis Date   CKD (chronic kidney disease), stage II    nephrologist-- dr Kathrene Bongo  [facility])   Diabetes mellitus type 2, diet-controlled (HCC)    followed by pcp---   (09-20-2019  per pt does not check blood sugar at home)   Fracture of humeral shaft, right, closed 04/25/2022   GERD (gastroesophageal reflux disease)    History of primary hyperparathyroidism    s/p  parathyroidectomy right inferior on  12-02-2012   Humerus fracture 04/25/2022   Hyperlipidemia    Hypertension    followd by nephrologist----  (09-20-2019  per pt had stress test done some time ago , unsure when/ where, thinks told ok)   OA (osteoarthritis)    Osteoporosis    Prosthetic joint infection (HCC)    followed by dr j. hatcher (ID)   left total shoulder arthroplasty 12/ 2016  ; 12/ 2020  revision with explant joint replacement and hemiarthroplasty;  completed 6 month antibiotic 05/ 2021   Vertigo     Medications:  Infusions:   cefTRIAXone (ROCEPHIN)  IV Stopped (12/23/22 1951)   heparin 900 Units/hr (12/24/22 0400)    Assessment: 82 yoF admitted on 10/14 with acute metabolic encephalopathy secondary to UTI and complicated by associated AKI and hypercalcemia.  She was found to have new onset Afib and pharmacy is consulted to dose Heparin. Hx CVA 10/2022, CHADS-VASc is 8   Today, 12/24/2022: Heparin level 0.39, remains therapeutic on heparin 900 units/hr CBC:  Hgb decreased to 9.2, Plt WNL  No bleeding or complications reported by RN.  SCr decreased to 1.11 (peak 1.57)   Goal of Therapy:  Heparin level 0.3-0.7 units/ml Monitor platelets by anticoagulation protocol: Yes   Plan:  Continue heparin IV infusion at 900 units/hr Daily heparin level and CBC Follow up long-term anticoagulation plans.    Lynann Beaver PharmD, BCPS [facility] main pharmacy [phone] 12/24/2022 7:41 AM

## 2022-12-24 NOTE — Plan of Care (Signed)
  Problem: Education: Goal: Knowledge of the prescribed therapeutic regimen will improve Outcome: Progressing Goal: Understanding of activity limitations/precautions following surgery will improve Outcome: Progressing Goal: Individualized Educational Video(s) Outcome: Progressing   Problem: Activity: Goal: Ability to tolerate increased activity will improve Outcome: Progressing   Problem: Pain Management: Goal: Pain level will decrease with appropriate interventions Outcome: Progressing   Problem: Education: Goal: Knowledge of disease or condition will improve Outcome: Progressing Goal: Knowledge of secondary prevention will improve (MUST DOCUMENT ALL) Outcome: Progressing Goal: Knowledge of patient specific risk factors will improve Loraine Leriche N/A or DELETE if not current risk factor) Outcome: Progressing   Problem: Ischemic Stroke/TIA Tissue Perfusion: Goal: Complications of ischemic stroke/TIA will be minimized Outcome: Progressing   Problem: Coping: Goal: Will verbalize positive feelings about self Outcome: Progressing Goal: Will identify appropriate support needs Outcome: Progressing   Problem: Health Behavior/Discharge Planning: Goal: Ability to manage health-related needs will improve Outcome: Progressing Goal: Goals will be collaboratively established with patient/family Outcome: Progressing   Problem: Self-Care: Goal: Ability to participate in self-care as condition permits will improve Outcome: Progressing Goal: Verbalization of feelings and concerns over difficulty with self-care will improve Outcome: Progressing Goal: Ability to communicate needs accurately will improve Outcome: Progressing   Problem: Nutrition: Goal: Risk of aspiration will decrease Outcome: Progressing Goal: Dietary intake will improve Outcome: Progressing   Problem: Education: Goal: Knowledge of General Education information will improve Description: Including pain rating scale,  medication(s)/side effects and non-pharmacologic comfort measures Outcome: Progressing   Problem: Health Behavior/Discharge Planning: Goal: Ability to manage health-related needs will improve Outcome: Progressing   Problem: Clinical Measurements: Goal: Ability to maintain clinical measurements within normal limits will improve Outcome: Progressing Goal: Will remain free from infection Outcome: Progressing Goal: Diagnostic test results will improve Outcome: Progressing Goal: Respiratory complications will improve Outcome: Progressing Goal: Cardiovascular complication will be avoided Outcome: Progressing   Problem: Activity: Goal: Risk for activity intolerance will decrease Outcome: Progressing   Problem: Nutrition: Goal: Adequate nutrition will be maintained Outcome: Progressing   Problem: Coping: Goal: Level of anxiety will decrease Outcome: Progressing   Problem: Elimination: Goal: Will not experience complications related to bowel motility Outcome: Progressing Goal: Will not experience complications related to urinary retention Outcome: Progressing   Problem: Pain Managment: Goal: General experience of comfort will improve Outcome: Progressing   Problem: Safety: Goal: Ability to remain free from injury will improve Outcome: Progressing   Problem: Skin Integrity: Goal: Risk for impaired skin integrity will decrease Outcome: Progressing   Problem: Education: Goal: Ability to describe self-care measures that may prevent or decrease complications (Diabetes Survival Skills Education) will improve Outcome: Progressing Goal: Individualized Educational Video(s) Outcome: Progressing   Problem: Coping: Goal: Ability to adjust to condition or change in health will improve Outcome: Progressing   Problem: Fluid Volume: Goal: Ability to maintain a balanced intake and output will improve Outcome: Progressing   Problem: Health Behavior/Discharge Planning: Goal:  Ability to identify and utilize available resources and services will improve Outcome: Progressing Goal: Ability to manage health-related needs will improve Outcome: Progressing   Problem: Metabolic: Goal: Ability to maintain appropriate glucose levels will improve Outcome: Progressing   Problem: Nutritional: Goal: Maintenance of adequate nutrition will improve Outcome: Progressing Goal: Progress toward achieving an optimal weight will improve Outcome: Progressing   Problem: Skin Integrity: Goal: Risk for impaired skin integrity will decrease Outcome: Progressing   Problem: Tissue Perfusion: Goal: Adequacy of tissue perfusion will improve Outcome: Progressing

## 2022-12-24 NOTE — Progress Notes (Signed)
PROGRESS NOTE    Tina Frank  VHQ:[id number] DOB: [birth year] DOA: 12/22/2022 PCP: Sigmund Hazel, MD   Brief Narrative: Tina Frank is a 82 y.o. female with a history of diabetes mellitus type 2, hyperparathyroidism s/p parathyroidectomy, primary hypertension, chronic diastolic heart failure, CVA .  Patient presented secondary to worsening confusion and refusal to eat, drink or take medication and found to have acute metabolic encephalopathy secondary to UTI and complicated by associated AKI and hypercalcemia. Ceftriaxone started empirically. Urine culture is pending.   Assessment and Plan:  Acute metabolic encephalopathy Presumed secondary to UTI, but could also be a component from hypercalcemia.  Person her speech is still somewhat slurred which is different than her baseline otherwise her mentation is close to her baseline. Patient still somewhat not able to maintain chronology of the events on my evaluation.  E. coli UTI Urinalysis is suggestive of infection. Urine culture obtained. Empiric Ceftriaxone started. -Continue Ceftriaxone -Follow-up urine culture  AKI Baseline creatinine of 0.9. Creatinine of 1.57 on admission. IV fluids started. Creatinine improved  -Continue IV fluids  New onset atrial fibrillation Patient started on heparin IV. No echocardiogram obtained this admission, however patient has had a recent Transthoracic Echocardiogram from 10/13/2022 that was significant for an LVEF of 60-65% with grade 1 diastolic dysfunction and mildly dilated left atrium. Patient has a history of bradyarrhythmia for which she was sent to cardiology in May 2024. Not a great candidate for long-term anticoagulation but fall risk is now lower due to her debility per son.  Pending echocardiogram will consider switching to Xarelto which is the preferred medication per family.  Elevated troponin Troponin elevated from 54 > 94 > 97. No associated chest pain. Cardiology called per chart  review with no concern for ACS. Patient remains asymptomatic. Will check echocardiogram to confirm no evidence of ACS. Continue IV heparin for now.  Consult cardiology if echocardiogram shows abnormal EF or WMA.  Primary hypertension Patient is on clonidine, hydralazine as an outpatient which were held on admission.  Hypercalcemia History of primary hyperparathyroidism SP parathyroidectomy in 2014 Etiology of the hypercalcemia is not clear. Patient takes a lot of Tums and I do not think that she remembers how much she took. Calcium level improving with IV hydration. I will continue with IV hydration for now. She does have history of primary hyperparathyroidism as well as history of hyperparathyroid ectomy in 2014. PTH is actually low and therefore less likely the etiology here. Will check vitamin level as well as PTH RP although suspect mostly excessive oral intake of calcium as the etiology.  Diabetes mellitus type 2 Diet controlled. Well controlled. Last hemoglobin A1C of 6.0%. -SSI  DVT prophylaxis: Heparin IV Code Status:   Code Status: Limited: Do not attempt resuscitation (DNR) -DNR-LIMITED -Do Not Intubate/DNI  Family Communication: None at bedside Disposition Plan: Discharge back to SNF likely in 1-2 days pending transition to outpatient antibiotics, continued improvement in encephalopathy, improvement in serum calcium   Consultants:  None  Procedures:  None  Antimicrobials: None    Subjective: Denies any acute complaint.  No nausea no vomiting.  Requesting her Tums.  Objective: BP (!) 160/63   Pulse (!) 58   Temp 98.3 F (36.8 C) (Oral)   Resp 20   Ht 5\' 5"  (1.651 m)   Wt 57 kg   SpO2 100%   BMI 20.92 kg/m   Examination:  No asterixis. Alert awake and oriented. Clear to auscultation. S1-S2 present. Bowel sound present. No  edema.   Data Reviewed: I have personally reviewed following labs and imaging studies  CBC Lab Results  Component Value  Date   WBC 7.3 12/24/2022   RBC 3.04 (L) 12/24/2022   HGB 9.2 (L) 12/24/2022   HCT 29.0 (L) 12/24/2022   MCV 95.4 12/24/2022   MCH 30.3 12/24/2022   PLT 198 12/24/2022   MCHC 31.7 12/24/2022   RDW 14.6 12/24/2022   LYMPHSABS 1.3 10/12/2022   MONOABS 0.9 10/12/2022   EOSABS 0.2 10/12/2022   BASOSABS 0.1 10/12/2022     Last metabolic panel Lab Results  Component Value Date   NA 135 12/24/2022   K 3.1 (L) 12/24/2022   CL 105 12/24/2022   CO2 23 12/24/2022   BUN 29 (H) 12/24/2022   CREATININE 1.11 (H) 12/24/2022   GLUCOSE 93 12/24/2022   GFRNONAA 49 (L) 12/24/2022   GFRAA >60 04/11/2019   CALCIUM 11.1 (H) 12/24/2022   PHOS 2.6 12/24/2022   PROT 5.1 (L) 10/13/2022   ALBUMIN 2.7 (L) 12/24/2022   LABGLOB 2.4 10/13/2022   AGRATIO 1.3 10/13/2022   BILITOT 1.2 10/13/2022   ALKPHOS 56 10/13/2022   AST 14 (L) 10/13/2022   ALT 10 10/13/2022   ANIONGAP 7 12/24/2022    GFR: Estimated Creatinine Clearance: 33.9 mL/min (A) (by C-G formula based on SCr of 1.11 mg/dL (H)).  Recent Results (from the past 240 hour(s))  Urine Culture     Status: Abnormal (Preliminary result)   Collection Time: 12/22/22  4:56 PM   Specimen: Urine, Clean Catch  Result Value Ref Range Status   Specimen Description   Final    URINE, CLEAN CATCH Performed at [facility], [address], [place], [place] [address]    Special Requests   Final    NONE Performed at [facility], [address] [place], [place] [address]    Culture >=100,000 COLONIES/mL ESCHERICHIA COLI (A)  Final   Report Status PENDING  Incomplete  Blood culture (routine x 2)     Status: None (Preliminary result)   Collection Time: 12/22/22  6:34 PM   Specimen: BLOOD RIGHT HAND  Result Value Ref Range Status   Specimen Description   Final    BLOOD RIGHT HAND Performed at [facility], [address] [place], [place] [address]    Special Requests   Final    BOTTLES DRAWN AEROBIC AND  ANAEROBIC Blood Culture results may not be optimal due to an inadequate volume of blood received in culture bottles Performed at [facility], [address], [place], [place] [address]    Culture   Final    NO GROWTH 2 DAYS Performed at [facility], [address], [place], [place] [address]    Report Status PENDING  Incomplete      Radiology Studies: ECHOCARDIOGRAM LIMITED  Result Date: 12/24/2022    ECHOCARDIOGRAM LIMITED REPORT   Patient Name:   EMMILYN REVERON Date of Exam: 12/24/2022 Medical Rec #:  [id number]       Height:       65.0 in Accession #:    [id number]      Weight:       125.7 lb Date of Birth:  1941-02-07       BSA:          1.624 m Patient Age:    82 years        BP:  160/63 mmHg Patient Gender: F               HR:           68 bpm. Exam Location:  Inpatient Procedure: Limited Echo, Limited Color Doppler and Cardiac Doppler Indications:    Atrial fibrillation  History:        Patient has prior history of Echocardiogram examinations, most                 recent 10/13/2022. Risk Factors:Hypertension, Dyslipidemia and                 Diabetes. CKD.  Sonographer:    Milda Smart Referring Phys: [id number] Sheralyn Pinegar M Keyshon Stein  Sonographer Comments: Suboptimal subcostal window. Image acquisition challenging due to respiratory motion. IMPRESSIONS  1. Left ventricular ejection fraction, by estimation, is 60 to 65%. The left ventricle has normal function.  2. Right ventricular systolic function is normal. The right ventricular size is normal. Tricuspid regurgitation signal is inadequate for assessing PA pressure.  3. The mitral valve is grossly normal. Trivial mitral valve regurgitation. No evidence of mitral stenosis.  4. The aortic valve is tricuspid. There is mild calcification of the aortic valve. Aortic valve regurgitation is trivial. Aortic valve sclerosis is present, with no evidence of aortic valve stenosis.  5. The inferior vena cava is normal in size  with greater than 50% respiratory variability, suggesting right atrial pressure of 3 mmHg. Comparison(s): No significant change from prior study. FINDINGS  Left Ventricle: Left ventricular ejection fraction, by estimation, is 60 to 65%. The left ventricle has normal function. The left ventricular internal cavity size was normal in size. There is no left ventricular hypertrophy. Right Ventricle: The right ventricular size is normal. No increase in right ventricular wall thickness. Right ventricular systolic function is normal. Tricuspid regurgitation signal is inadequate for assessing PA pressure. Pericardium: There is no evidence of pericardial effusion. Presence of epicardial fat layer. Mitral Valve: The mitral valve is grossly normal. Trivial mitral valve regurgitation. No evidence of mitral valve stenosis. Tricuspid Valve: The tricuspid valve is grossly normal. Tricuspid valve regurgitation is trivial. No evidence of tricuspid stenosis. Aortic Valve: The aortic valve is tricuspid. There is mild calcification of the aortic valve. Aortic valve regurgitation is trivial. Aortic valve sclerosis is present, with no evidence of aortic valve stenosis. Pulmonic Valve: The pulmonic valve was grossly normal. Pulmonic valve regurgitation is trivial. No evidence of pulmonic stenosis. Venous: The inferior vena cava is normal in size with greater than 50% respiratory variability, suggesting right atrial pressure of 3 mmHg. Additional Comments: Spectral Doppler performed. Color Doppler performed.  LEFT VENTRICLE PLAX 2D LVIDd:         3.90 cm LVIDs:         2.50 cm LV PW:         1.00 cm LV IVS:        0.90 cm  LEFT ATRIUM         Index LA diam:    3.40 cm 2.09 cm/m  AORTIC VALVE LVOT Vmax:   129.00 cm/s LVOT Vmean:  93.400 cm/s LVOT VTI:    0.275 m  SHUNTS Systemic VTI: 0.28 m Lennie Odor MD Electronically signed by Lennie Odor MD Signature Date/Time: 12/24/2022/5:06:50 PM    Final       LOS: 2 days   Author: Lynden Oxford, MD  [facility] 12/24/2022  6:59 PM Between 7PM-7AM, please contact night-coverage, check [email or web address] for on call.

## 2022-12-25 DIAGNOSIS — G9340 Encephalopathy, unspecified: Secondary | ICD-10-CM | POA: Diagnosis not present

## 2022-12-25 LAB — CBC
HCT: 28.7 % — ABNORMAL LOW (ref 36.0–46.0)
Hemoglobin: 9.4 g/dL — ABNORMAL LOW (ref 12.0–15.0)
MCH: 30.7 pg (ref 26.0–34.0)
MCHC: 32.8 g/dL (ref 30.0–36.0)
MCV: 93.8 fL (ref 80.0–100.0)
Platelets: 195 10*3/uL (ref 150–400)
RBC: 3.06 MIL/uL — ABNORMAL LOW (ref 3.87–5.11)
RDW: 14.6 % (ref 11.5–15.5)
WBC: 7.8 10*3/uL (ref 4.0–10.5)
nRBC: 0 % (ref 0.0–0.2)

## 2022-12-25 LAB — HEPARIN LEVEL (UNFRACTIONATED): Heparin Unfractionated: 0.43 [IU]/mL (ref 0.30–0.70)

## 2022-12-25 LAB — GLUCOSE, CAPILLARY
Glucose-Capillary: 92 mg/dL (ref 70–99)
Glucose-Capillary: 153 mg/dL — ABNORMAL HIGH (ref 70–99)
Glucose-Capillary: 104 mg/dL — ABNORMAL HIGH (ref 70–99)
Glucose-Capillary: 143 mg/dL — ABNORMAL HIGH (ref 70–99)

## 2022-12-25 LAB — BASIC METABOLIC PANEL
Anion gap: 9 (ref 5–15)
BUN: 20 mg/dL (ref 8–23)
CO2: 21 mmol/L — ABNORMAL LOW (ref 22–32)
Calcium: 10.9 mg/dL — ABNORMAL HIGH (ref 8.9–10.3)
Chloride: 105 mmol/L (ref 98–111)
Creatinine, Ser: 1 mg/dL (ref 0.44–1.00)
GFR, Estimated: 56 mL/min — ABNORMAL LOW (ref >60)
Glucose, Bld: 97 mg/dL (ref 70–99)
Potassium: 3.2 mmol/L — ABNORMAL LOW (ref 3.5–5.1)
Sodium: 135 mmol/L (ref 135–145)

## 2022-12-25 LAB — URINE CULTURE: Culture: >=100000 — AB

## 2022-12-25 LAB — MAGNESIUM: Magnesium: 1.7 mg/dL (ref 1.7–2.4)

## 2022-12-25 LAB — VITAMIN D 25 HYDROXY (VIT D DEFICIENCY, FRACTURES): Vit D, 25-Hydroxy: 30.07 ng/mL (ref 30–100)

## 2022-12-25 MED ORDER — CEFADROXIL 500 MG PO CAPS
500.0000 mg | ORAL_CAPSULE | Freq: Two times a day (BID) | ORAL | Status: AC
  Administered 2022-12-25 – 2022-12-29 (×10): 500 mg via ORAL
  Filled 2022-12-25 (×10): qty 1

## 2022-12-25 MED ORDER — POTASSIUM CHLORIDE CRYS ER 20 MEQ PO TBCR
40.0000 meq | EXTENDED_RELEASE_TABLET | ORAL | Status: AC
  Administered 2022-12-25 (×2): 40 meq via ORAL
  Filled 2022-12-25 (×2): qty 2

## 2022-12-25 NOTE — Plan of Care (Signed)
  Problem: Activity: Goal: Ability to tolerate increased activity will improve Outcome: Progressing   Problem: Pain Management: Goal: Pain level will decrease with appropriate interventions Outcome: Progressing   Problem: Coping: Goal: Will verbalize positive feelings about self Outcome: Progressing   Problem: Self-Care: Goal: Ability to participate in self-care as condition permits will improve Outcome: Progressing   Problem: Nutrition: Goal: Risk of aspiration will decrease Outcome: Progressing   Problem: Education: Goal: Knowledge of General Education information will improve Description: Including pain rating scale, medication(s)/side effects and non-pharmacologic comfort measures Outcome: Progressing   Problem: Activity: Goal: Risk for activity intolerance will decrease Outcome: Progressing   Problem: Skin Integrity: Goal: Risk for impaired skin integrity will decrease Outcome: Progressing

## 2022-12-25 NOTE — Plan of Care (Signed)
Problem: Education: Goal: Knowledge of the prescribed therapeutic regimen will improve Outcome: Progressing Goal: Understanding of activity limitations/precautions following surgery will improve Outcome: Progressing Goal: Individualized Educational Video(s) Outcome: Progressing   Problem: Activity: Goal: Ability to tolerate increased activity will improve Outcome: Progressing   Problem: Pain Management: Goal: Pain level will decrease with appropriate interventions Outcome: Progressing   Problem: Education: Goal: Knowledge of disease or condition will improve Outcome: Progressing Goal: Knowledge of secondary prevention will improve (MUST DOCUMENT ALL) Outcome: Progressing Goal: Knowledge of patient specific risk factors will improve Loraine Leriche N/A or DELETE if not current risk factor) Outcome: Progressing   Problem: Ischemic Stroke/TIA Tissue Perfusion: Goal: Complications of ischemic stroke/TIA will be minimized Outcome: Progressing   Problem: Coping: Goal: Will verbalize positive feelings about self Outcome: Progressing Goal: Will identify appropriate support needs Outcome: Progressing   Problem: Health Behavior/Discharge Planning: Goal: Ability to manage health-related needs will improve Outcome: Progressing Goal: Goals will be collaboratively established with patient/family Outcome: Progressing   Problem: Self-Care: Goal: Ability to participate in self-care as condition permits will improve Outcome: Progressing Goal: Verbalization of feelings and concerns over difficulty with self-care will improve Outcome: Progressing Goal: Ability to communicate needs accurately will improve Outcome: Progressing   Problem: Nutrition: Goal: Risk of aspiration will decrease Outcome: Progressing Goal: Dietary intake will improve Outcome: Progressing   Problem: Education: Goal: Knowledge of General Education information will improve Description: Including pain rating scale,  medication(s)/side effects and non-pharmacologic comfort measures Outcome: Progressing   Problem: Health Behavior/Discharge Planning: Goal: Ability to manage health-related needs will improve Outcome: Progressing   Problem: Clinical Measurements: Goal: Ability to maintain clinical measurements within normal limits will improve Outcome: Progressing Goal: Will remain free from infection Outcome: Progressing Goal: Diagnostic test results will improve Outcome: Progressing Goal: Respiratory complications will improve Outcome: Progressing Goal: Cardiovascular complication will be avoided Outcome: Progressing   Problem: Activity: Goal: Risk for activity intolerance will decrease Outcome: Progressing   Problem: Nutrition: Goal: Adequate nutrition will be maintained Outcome: Progressing   Problem: Coping: Goal: Level of anxiety will decrease Outcome: Progressing   Problem: Elimination: Goal: Will not experience complications related to bowel motility Outcome: Progressing Goal: Will not experience complications related to urinary retention Outcome: Progressing   Problem: Pain Managment: Goal: General experience of comfort will improve Outcome: Progressing   Problem: Safety: Goal: Ability to remain free from injury will improve Outcome: Progressing   Problem: Skin Integrity: Goal: Risk for impaired skin integrity will decrease Outcome: Progressing   Problem: Education: Goal: Ability to describe self-care measures that may prevent or decrease complications (Diabetes Survival Skills Education) will improve Outcome: Progressing Goal: Individualized Educational Video(s) Outcome: Progressing   Problem: Coping: Goal: Ability to adjust to condition or change in health will improve Outcome: Progressing   Problem: Fluid Volume: Goal: Ability to maintain a balanced intake and output will improve Outcome: Progressing   Problem: Health Behavior/Discharge Planning: Goal:  Ability to identify and utilize available resources and services will improve Outcome: Progressing Goal: Ability to manage health-related needs will improve Outcome: Progressing   Problem: Metabolic: Goal: Ability to maintain appropriate glucose levels will improve Outcome: Progressing   Problem: Nutritional: Goal: Maintenance of adequate nutrition will improve Outcome: Progressing Goal: Progress toward achieving an optimal weight will improve Outcome: Progressing   Problem: Skin Integrity: Goal: Risk for impaired skin integrity will decrease Outcome: Progressing   Problem: Tissue Perfusion: Goal: Adequacy of tissue perfusion will improve Outcome: Progressing   Problem: Education: Goal: Understanding of post-operative  needs will improve Outcome: Progressing Goal: Individualized Educational Video(s) Outcome: Progressing   Problem: Clinical Measurements: Goal: Postoperative complications will be avoided or minimized Outcome: Progressing

## 2022-12-25 NOTE — Progress Notes (Signed)
PROGRESS NOTE    Tina Frank  ZOX:[id number] DOB: 1941/01/12 DOA: 12/22/2022 PCP: Sigmund Hazel, MD   Brief Narrative: Tina Frank is a 82 y.o. female with a history of diabetes mellitus type 2, hyperparathyroidism s/p parathyroidectomy, primary hypertension, chronic diastolic heart failure, CVA .  Patient presented secondary to worsening confusion and refusal to eat, drink or take medication and found to have acute metabolic encephalopathy secondary to UTI and complicated by associated AKI and hypercalcemia. Ceftriaxone started empirically. Urine culture is pending.   Assessment and Plan:  Acute metabolic encephalopathy Presumed secondary to UTI, but could also be a component from hypercalcemia.  Mentation very close to baseline.  E. coli UTI Urinalysis is suggestive of infection. Urine culture obtained. Empiric Ceftriaxone started. Urine culture showed E. coli.  Currently on oral cefadroxil.  AKI Baseline creatinine of 0.9. Creatinine of 1.57 on admission. IV fluids started. Creatinine improved treated with IV fluid.  Will  New onset atrial fibrillation Patient started on heparin IV. No echocardiogram obtained this admission, however patient has had a recent Transthoracic Echocardiogram from 10/13/2022 that was significant for an LVEF of 60-65% with grade 1 diastolic dysfunction and mildly dilated left atrium. Patient has a history of bradyarrhythmia for which she was sent to cardiology in May 2024. Not a great candidate for long-term anticoagulation but fall risk is now lower due to her debility per son.  Pending echocardiogram will consider switching to Xarelto which is the preferred medication per family. Switch to Xarelto tomorrow.  Elevated troponin Troponin elevated from 54 > 94 > 97. No associated chest pain. Cardiology called per chart review with no concern for ACS. Patient remains asymptomatic. Continue IV heparin for now.  Echocardiogram shows preserved EF.  No  significant valvular abnormality.  Primary hypertension Patient is on clonidine, hydralazine as an outpatient which were held on admission.  Hypercalcemia History of primary hyperparathyroidism SP parathyroidectomy in 2014 Etiology of the hypercalcemia is not clear. Patient takes a lot of Tums and I do not think that she remembers how much she took. Calcium level improving with IV hydration. She does have history of primary hyperparathyroidism as well as history of hyperparathyroid ectomy in 2014. PTH is actually low and therefore less likely the etiology here. Vitamin D level unremarkable as well. Other workup pending. suspect mostly excessive oral intake of calcium as the etiology. Will hold IV fluid and monitor.  Diabetes mellitus type 2 Diet controlled. Well controlled. Last hemoglobin A1C of 6.0%. -SSI  DVT prophylaxis: Heparin IV Code Status:   Code Status: Limited: Do not attempt resuscitation (DNR) -DNR-LIMITED -Do Not Intubate/DNI  Family Communication: None at bedside Disposition Plan: Back to SNF as long as calcium level better.  Consultants:  None  Procedures:  None  Antimicrobials: None    Subjective: No complaint.  No nausea vomiting fever no chills.  Objective: BP (!) 124/56 (BP Location: Left Arm)   Pulse (!) 54   Temp 98.2 F (36.8 C) (Oral)   Resp 20   Ht 5\' 5"  (1.651 m)   Wt 56.7 kg   SpO2 100%   BMI 20.80 kg/m   Examination:  Clear to auscultation. S1-S2 present. Bowel sound present No significant edema.   Data Reviewed: I have personally reviewed following labs and imaging studies  CBC Lab Results  Component Value Date   WBC 7.8 12/25/2022   RBC 3.06 (L) 12/25/2022   HGB 9.4 (L) 12/25/2022   HCT 28.7 (L) 12/25/2022   MCV 93.8  PROGRESS NOTE    Tina Frank  ZOX:[id number] DOB: 1941/01/12 DOA: 12/22/2022 PCP: Sigmund Hazel, MD   Brief Narrative: Tina Frank is a 82 y.o. female with a history of diabetes mellitus type 2, hyperparathyroidism s/p parathyroidectomy, primary hypertension, chronic diastolic heart failure, CVA .  Patient presented secondary to worsening confusion and refusal to eat, drink or take medication and found to have acute metabolic encephalopathy secondary to UTI and complicated by associated AKI and hypercalcemia. Ceftriaxone started empirically. Urine culture is pending.   Assessment and Plan:  Acute metabolic encephalopathy Presumed secondary to UTI, but could also be a component from hypercalcemia.  Mentation very close to baseline.  E. coli UTI Urinalysis is suggestive of infection. Urine culture obtained. Empiric Ceftriaxone started. Urine culture showed E. coli.  Currently on oral cefadroxil.  AKI Baseline creatinine of 0.9. Creatinine of 1.57 on admission. IV fluids started. Creatinine improved treated with IV fluid.  Will  New onset atrial fibrillation Patient started on heparin IV. No echocardiogram obtained this admission, however patient has had a recent Transthoracic Echocardiogram from 10/13/2022 that was significant for an LVEF of 60-65% with grade 1 diastolic dysfunction and mildly dilated left atrium. Patient has a history of bradyarrhythmia for which she was sent to cardiology in May 2024. Not a great candidate for long-term anticoagulation but fall risk is now lower due to her debility per son.  Pending echocardiogram will consider switching to Xarelto which is the preferred medication per family. Switch to Xarelto tomorrow.  Elevated troponin Troponin elevated from 54 > 94 > 97. No associated chest pain. Cardiology called per chart review with no concern for ACS. Patient remains asymptomatic. Continue IV heparin for now.  Echocardiogram shows preserved EF.  No  significant valvular abnormality.  Primary hypertension Patient is on clonidine, hydralazine as an outpatient which were held on admission.  Hypercalcemia History of primary hyperparathyroidism SP parathyroidectomy in 2014 Etiology of the hypercalcemia is not clear. Patient takes a lot of Tums and I do not think that she remembers how much she took. Calcium level improving with IV hydration. She does have history of primary hyperparathyroidism as well as history of hyperparathyroid ectomy in 2014. PTH is actually low and therefore less likely the etiology here. Vitamin D level unremarkable as well. Other workup pending. suspect mostly excessive oral intake of calcium as the etiology. Will hold IV fluid and monitor.  Diabetes mellitus type 2 Diet controlled. Well controlled. Last hemoglobin A1C of 6.0%. -SSI  DVT prophylaxis: Heparin IV Code Status:   Code Status: Limited: Do not attempt resuscitation (DNR) -DNR-LIMITED -Do Not Intubate/DNI  Family Communication: None at bedside Disposition Plan: Back to SNF as long as calcium level better.  Consultants:  None  Procedures:  None  Antimicrobials: None    Subjective: No complaint.  No nausea vomiting fever no chills.  Objective: BP (!) 124/56 (BP Location: Left Arm)   Pulse (!) 54   Temp 98.2 F (36.8 C) (Oral)   Resp 20   Ht 5\' 5"  (1.651 m)   Wt 56.7 kg   SpO2 100%   BMI 20.80 kg/m   Examination:  Clear to auscultation. S1-S2 present. Bowel sound present No significant edema.   Data Reviewed: I have personally reviewed following labs and imaging studies  CBC Lab Results  Component Value Date   WBC 7.8 12/25/2022   RBC 3.06 (L) 12/25/2022   HGB 9.4 (L) 12/25/2022   HCT 28.7 (L) 12/25/2022   MCV 93.8  PROGRESS NOTE    Tina Frank  ZOX:[id number] DOB: 1941/01/12 DOA: 12/22/2022 PCP: Sigmund Hazel, MD   Brief Narrative: Tina Frank is a 82 y.o. female with a history of diabetes mellitus type 2, hyperparathyroidism s/p parathyroidectomy, primary hypertension, chronic diastolic heart failure, CVA .  Patient presented secondary to worsening confusion and refusal to eat, drink or take medication and found to have acute metabolic encephalopathy secondary to UTI and complicated by associated AKI and hypercalcemia. Ceftriaxone started empirically. Urine culture is pending.   Assessment and Plan:  Acute metabolic encephalopathy Presumed secondary to UTI, but could also be a component from hypercalcemia.  Mentation very close to baseline.  E. coli UTI Urinalysis is suggestive of infection. Urine culture obtained. Empiric Ceftriaxone started. Urine culture showed E. coli.  Currently on oral cefadroxil.  AKI Baseline creatinine of 0.9. Creatinine of 1.57 on admission. IV fluids started. Creatinine improved treated with IV fluid.  Will  New onset atrial fibrillation Patient started on heparin IV. No echocardiogram obtained this admission, however patient has had a recent Transthoracic Echocardiogram from 10/13/2022 that was significant for an LVEF of 60-65% with grade 1 diastolic dysfunction and mildly dilated left atrium. Patient has a history of bradyarrhythmia for which she was sent to cardiology in May 2024. Not a great candidate for long-term anticoagulation but fall risk is now lower due to her debility per son.  Pending echocardiogram will consider switching to Xarelto which is the preferred medication per family. Switch to Xarelto tomorrow.  Elevated troponin Troponin elevated from 54 > 94 > 97. No associated chest pain. Cardiology called per chart review with no concern for ACS. Patient remains asymptomatic. Continue IV heparin for now.  Echocardiogram shows preserved EF.  No  significant valvular abnormality.  Primary hypertension Patient is on clonidine, hydralazine as an outpatient which were held on admission.  Hypercalcemia History of primary hyperparathyroidism SP parathyroidectomy in 2014 Etiology of the hypercalcemia is not clear. Patient takes a lot of Tums and I do not think that she remembers how much she took. Calcium level improving with IV hydration. She does have history of primary hyperparathyroidism as well as history of hyperparathyroid ectomy in 2014. PTH is actually low and therefore less likely the etiology here. Vitamin D level unremarkable as well. Other workup pending. suspect mostly excessive oral intake of calcium as the etiology. Will hold IV fluid and monitor.  Diabetes mellitus type 2 Diet controlled. Well controlled. Last hemoglobin A1C of 6.0%. -SSI  DVT prophylaxis: Heparin IV Code Status:   Code Status: Limited: Do not attempt resuscitation (DNR) -DNR-LIMITED -Do Not Intubate/DNI  Family Communication: None at bedside Disposition Plan: Back to SNF as long as calcium level better.  Consultants:  None  Procedures:  None  Antimicrobials: None    Subjective: No complaint.  No nausea vomiting fever no chills.  Objective: BP (!) 124/56 (BP Location: Left Arm)   Pulse (!) 54   Temp 98.2 F (36.8 C) (Oral)   Resp 20   Ht 5\' 5"  (1.651 m)   Wt 56.7 kg   SpO2 100%   BMI 20.80 kg/m   Examination:  Clear to auscultation. S1-S2 present. Bowel sound present No significant edema.   Data Reviewed: I have personally reviewed following labs and imaging studies  CBC Lab Results  Component Value Date   WBC 7.8 12/25/2022   RBC 3.06 (L) 12/25/2022   HGB 9.4 (L) 12/25/2022   HCT 28.7 (L) 12/25/2022   MCV 93.8  Accession #:    [id number]      Weight:       125.7 lb Date of Birth:  [birth year]       BSA:          1.624 m Patient Age:    82 years        BP:           160/63 mmHg Patient Gender: F               HR:           68 bpm. Exam Location:  Inpatient Procedure: Limited Echo, Limited Color Doppler and Cardiac Doppler Indications:    Atrial fibrillation  History:        Patient has prior history of Echocardiogram examinations, most                 recent 10/13/2022. Risk Factors:Hypertension, Dyslipidemia and                 Diabetes. CKD.  Sonographer:    Milda Smart Referring Phys: [id number] Estefano Victory M Jaylin Benzel  Sonographer Comments: Suboptimal subcostal window. Image acquisition challenging due to respiratory motion. IMPRESSIONS  1. Left ventricular ejection fraction, by estimation, is 60 to 65%. The left ventricle has normal function.  2. Right ventricular systolic function is normal. The right ventricular size is normal. Tricuspid regurgitation signal is inadequate for assessing PA pressure.  3. The mitral valve is grossly normal. Trivial mitral valve regurgitation. No  evidence of mitral stenosis.  4. The aortic valve is tricuspid. There is mild calcification of the aortic valve. Aortic valve regurgitation is trivial. Aortic valve sclerosis is present, with no evidence of aortic valve stenosis.  5. The inferior vena cava is normal in size with greater than 50% respiratory variability, suggesting right atrial pressure of 3 mmHg. Comparison(s): No significant change from prior study. FINDINGS  Left Ventricle: Left ventricular ejection fraction, by estimation, is 60 to 65%. The left ventricle has normal function. The left ventricular internal cavity size was normal in size. There is no left ventricular hypertrophy. Right Ventricle: The right ventricular size is normal. No increase in right ventricular wall thickness. Right ventricular systolic function is normal. Tricuspid regurgitation signal is inadequate for assessing PA pressure. Pericardium: There is no evidence of pericardial effusion. Presence of epicardial fat layer. Mitral Valve: The mitral valve is grossly normal. Trivial mitral valve regurgitation. No evidence of mitral valve stenosis. Tricuspid Valve: The tricuspid valve is grossly normal. Tricuspid valve regurgitation is trivial. No evidence of tricuspid stenosis. Aortic Valve: The aortic valve is tricuspid. There is mild calcification of the aortic valve. Aortic valve regurgitation is trivial. Aortic valve sclerosis is present, with no evidence of aortic valve stenosis. Pulmonic Valve: The pulmonic valve was grossly normal. Pulmonic valve regurgitation is trivial. No evidence of pulmonic stenosis. Venous: The inferior vena cava is normal in size with greater than 50% respiratory variability, suggesting right atrial pressure of 3 mmHg. Additional Comments: Spectral Doppler performed. Color Doppler performed.  LEFT VENTRICLE PLAX 2D LVIDd:         3.90 cm LVIDs:         2.50 cm LV PW:         1.00 cm LV IVS:        0.90 cm  LEFT ATRIUM         Index LA diam:    3.40 cm  2.09 cm/m  AORTIC VALVE LVOT Vmax:   129.00 cm/s

## 2022-12-25 NOTE — TOC Progression Note (Signed)
Transition of Care [facility]) - Progression Note    Patient Details  Name: Tina Frank MRN: [id number] Date of Birth: 1941/02/17  [facility] CM/SW Contact  Howell Rucks, RN Phone Number: 12/25/2022, 9:39 AM  Clinical Narrative:   [facility] consult for SNF placement, pt admitted from [facility] short term rehab, confirmed pt can return, will need insurance auth. PT eval request sent to attending.     Expected Discharge Plan:  (TBD) Barriers to Discharge: Continued Medical Work up  Expected Discharge Plan and Services                                               Social Determinants of Health (SDOH) Interventions SDOH Screenings   Food Insecurity: No Food Insecurity (12/22/2022)  Housing: Low Risk  (12/22/2022)  Transportation Needs: No Transportation Needs (12/22/2022)  Utilities: Not At Risk (12/22/2022)  Depression (PHQ2-9): Low Risk  (07/19/2019)  Tobacco Use: Medium Risk (12/22/2022)    Readmission Risk Interventions    04/28/2022    3:15 PM  Readmission Risk Prevention Plan  Transportation Screening Complete  PCP or Specialist Appt within 5-7 Days Complete  Home Care Screening Complete  Medication Review (RN CM) Complete

## 2022-12-25 NOTE — Progress Notes (Signed)
PHARMACY - ANTICOAGULATION CONSULT NOTE  Pharmacy Consult for Heparin Indication: atrial fibrillation  Allergies  Allergen Reactions   Abaloparatide Other (See Comments)    "Burred vision, lightheaded" (patient does not recall this in 2024)   Mobic [Meloxicam] Hypertension   Crestor [Rosuvastatin] Other (See Comments)    Muscle aches   Norvasc [Amlodipine Besylate] Swelling and Other (See Comments)    Ankle swelling   Simvastatin Other (See Comments)    Muscle aches  Other Reaction(s): high LFTs   Sulfa Antibiotics Rash   Zetia [Ezetimibe] Other (See Comments)    Muscle aches    Patient Measurements: Height: 5\' 5"  (165.1 cm) Weight: 57 kg (125 lb 11.2 oz) IBW/kg (Calculated) : 57 Heparin Dosing Weight: TBW  Vital Signs: Temp: 98.2 F (36.8 C) (10/17 0225) Temp Source: Oral (10/17 0225) BP: 156/58 (10/17 0225) Pulse Rate: 59 (10/17 0225)  Labs: Recent Labs    12/22/22 1742 12/22/22 1759 12/22/22 2000 12/22/22 2249 12/23/22 0606 12/23/22 0751 12/23/22 1357 12/24/22 0347 12/25/22 0402  HGB  --   --   --   --  10.0*  --   --  9.2* 9.4*  HCT  --   --   --   --  33.1*  --   --  29.0* 28.7*  PLT  --   --   --   --  264  --   --  198 195  HEPARINUNFRC  --   --   --   --   --    < > 0.48 0.39 0.43  CREATININE  --    < >  --   --  1.41*  --   --  1.11* 1.00  TROPONINIHS 54*  --  94* 97*  --   --   --   --   --    < > = values in this interval not displayed.    Estimated Creatinine Clearance: 37.7 mL/min (by C-G formula based on SCr of 1 mg/dL).   Medical History: Past Medical History:  Diagnosis Date   CKD (chronic kidney disease), stage II    nephrologist-- dr Kathrene Bongo  [facility])   Diabetes mellitus type 2, diet-controlled (HCC)    followed by pcp---   (09-20-2019  per pt does not check blood sugar at home)   Fracture of humeral shaft, right, closed 04/25/2022   GERD (gastroesophageal reflux disease)    History of primary hyperparathyroidism     s/p  parathyroidectomy right inferior on 12-02-2012   Humerus fracture 04/25/2022   Hyperlipidemia    Hypertension    followd by nephrologist----  (09-20-2019  per pt had stress test done some time ago , unsure when/ where, thinks told ok)   OA (osteoarthritis)    Osteoporosis    Prosthetic joint infection (HCC)    followed by dr j. hatcher (ID)   left total shoulder arthroplasty 12/ 2016  ; 12/ 2020  revision with explant joint replacement and hemiarthroplasty;  completed 6 month antibiotic 05/ 2021   Vertigo     Medications:  Infusions:   cefTRIAXone (ROCEPHIN)  IV 2 g (12/24/22 1610)   heparin 900 Units/hr (12/24/22 0400)    Assessment: 82 yoF admitted on 10/14 with acute metabolic encephalopathy secondary to UTI and complicated by associated AKI and hypercalcemia.  She was found to have new onset Afib and pharmacy is consulted to dose Heparin. Hx CVA 10/2022, CHADS-VASc is 8   Today, 12/25/2022: Heparin level 0.43, remains therapeutic  on heparin 900 units/hr CBC:  Hgb low but stable, plts WNL No bleeding or complications reported by RN.  SCr now 1.0   Goal of Therapy:  Heparin level 0.3-0.7 units/ml Monitor platelets by anticoagulation protocol: Yes   Plan:  Continue heparin IV infusion at 900 units/hr Daily heparin level and CBC Follow up long-term anticoagulation plans.   Arley Phenix RPh 12/25/2022, 4:39 AM

## 2022-12-26 DIAGNOSIS — G9340 Encephalopathy, unspecified: Secondary | ICD-10-CM | POA: Diagnosis not present

## 2022-12-26 LAB — CBC
HCT: 29.9 % — ABNORMAL LOW (ref 36.0–46.0)
Hemoglobin: 9.5 g/dL — ABNORMAL LOW (ref 12.0–15.0)
MCH: 29.7 pg (ref 26.0–34.0)
MCHC: 31.8 g/dL (ref 30.0–36.0)
MCV: 93.4 fL (ref 80.0–100.0)
Platelets: 191 10*3/uL (ref 150–400)
RBC: 3.2 MIL/uL — ABNORMAL LOW (ref 3.87–5.11)
RDW: 14.3 % (ref 11.5–15.5)
WBC: 7.4 10*3/uL (ref 4.0–10.5)
nRBC: 0 % (ref 0.0–0.2)

## 2022-12-26 LAB — BASIC METABOLIC PANEL
Anion gap: 6 (ref 5–15)
BUN: 16 mg/dL (ref 8–23)
CO2: 24 mmol/L (ref 22–32)
Calcium: 10.8 mg/dL — ABNORMAL HIGH (ref 8.9–10.3)
Chloride: 102 mmol/L (ref 98–111)
Creatinine, Ser: 0.9 mg/dL (ref 0.44–1.00)
GFR, Estimated: >60 mL/min (ref >60)
Glucose, Bld: 92 mg/dL (ref 70–99)
Potassium: 3.4 mmol/L — ABNORMAL LOW (ref 3.5–5.1)
Sodium: 132 mmol/L — ABNORMAL LOW (ref 135–145)

## 2022-12-26 LAB — GLUCOSE, CAPILLARY
Glucose-Capillary: 92 mg/dL (ref 70–99)
Glucose-Capillary: 122 mg/dL — ABNORMAL HIGH (ref 70–99)
Glucose-Capillary: 98 mg/dL (ref 70–99)
Glucose-Capillary: 114 mg/dL — ABNORMAL HIGH (ref 70–99)

## 2022-12-26 LAB — CALCITRIOL (1,25 DI-OH VIT D): Vit D, 1,25-Dihydroxy: 9.1 pg/mL — ABNORMAL LOW (ref 24.8–81.5)

## 2022-12-26 LAB — MAGNESIUM: Magnesium: 1.8 mg/dL (ref 1.7–2.4)

## 2022-12-26 LAB — HEPARIN LEVEL (UNFRACTIONATED): Heparin Unfractionated: 0.28 [IU]/mL — ABNORMAL LOW (ref 0.30–0.70)

## 2022-12-26 MED ORDER — POTASSIUM CHLORIDE CRYS ER 20 MEQ PO TBCR
40.0000 meq | EXTENDED_RELEASE_TABLET | Freq: Once | ORAL | Status: AC
  Administered 2022-12-26: 40 meq via ORAL
  Filled 2022-12-26: qty 2

## 2022-12-26 MED ORDER — POTASSIUM CHLORIDE CRYS ER 20 MEQ PO TBCR: 20.0000 meq | EXTENDED_RELEASE_TABLET | Freq: Every day | ORAL | 0 refills | Status: DC

## 2022-12-26 MED ORDER — HEPARIN (PORCINE) 25000 UT/250ML-% IV SOLN
1000.0000 [IU]/h | INTRAVENOUS | Status: AC
  Administered 2022-12-26: 1000 [IU]/h via INTRAVENOUS
  Filled 2022-12-26: qty 250

## 2022-12-26 MED ORDER — ASPIRIN 81 MG PO TBEC: 81.0000 mg | DELAYED_RELEASE_TABLET | Freq: Every day | ORAL | 0 refills | Status: DC

## 2022-12-26 MED ORDER — POTASSIUM CHLORIDE CRYS ER 20 MEQ PO TBCR
20.0000 meq | EXTENDED_RELEASE_TABLET | Freq: Every day | ORAL | Status: DC
  Administered 2022-12-27 – 2022-12-31 (×5): 20 meq via ORAL
  Filled 2022-12-26 (×6): qty 1

## 2022-12-26 MED ORDER — RIVAROXABAN 15 MG PO TABS: 15.0000 mg | ORAL_TABLET | Freq: Every day | ORAL | 0 refills | Status: AC

## 2022-12-26 MED ORDER — ASPIRIN 81 MG PO TBEC
81.0000 mg | DELAYED_RELEASE_TABLET | Freq: Every day | ORAL | Status: DC
  Administered 2022-12-26 – 2023-01-08 (×14): 81 mg via ORAL
  Filled 2022-12-26 (×14): qty 1

## 2022-12-26 MED ORDER — PANTOPRAZOLE SODIUM 40 MG PO TBEC: 40.0000 mg | DELAYED_RELEASE_TABLET | Freq: Two times a day (BID) | ORAL | 0 refills | Status: DC

## 2022-12-26 MED ORDER — CEFADROXIL 500 MG PO CAPS: 500.0000 mg | ORAL_CAPSULE | Freq: Two times a day (BID) | ORAL | 0 refills | Status: DC

## 2022-12-26 MED ORDER — RIVAROXABAN 15 MG PO TABS
15.0000 mg | ORAL_TABLET | Freq: Every day | ORAL | Status: DC
  Administered 2022-12-26 – 2023-01-02 (×8): 15 mg via ORAL
  Filled 2022-12-26 (×8): qty 1

## 2022-12-26 NOTE — TOC Progression Note (Signed)
Transition of Care [facility]) - Progression Note    Patient Details  Name: Donise Mauzey MRN: [id number] Date of Birth: 1940/07/01  Transition of Care [facility]) CM/SW Contact  Larrie Kass, LCSW Phone Number: 12/26/2022, 11:07 AM  Clinical Narrative:     CSW met with pt and pt's son to discuss pt return back to [facility]. CSW to start insurance authorization. TOC to follow.    Expected Discharge Plan: Skilled Nursing Facility Barriers to Discharge: Insurance Authorization  Expected Discharge Plan and Services       Living arrangements for the past 2 months: Skilled Nursing Facility Expected Discharge Date: 12/26/22                                     Social Determinants of Health (SDOH) Interventions SDOH Screenings   Food Insecurity: No Food Insecurity (12/22/2022)  Housing: Low Risk  (12/22/2022)  Transportation Needs: No Transportation Needs (12/22/2022)  Utilities: Not At Risk (12/22/2022)  Depression (PHQ2-9): Low Risk  (07/19/2019)  Tobacco Use: Medium Risk (12/22/2022)    Readmission Risk Interventions    04/28/2022    3:15 PM  Readmission Risk Prevention Plan  Transportation Screening Complete  PCP or Specialist Appt within 5-7 Days Complete  Home Care Screening Complete  Medication Review (RN CM) Complete

## 2022-12-26 NOTE — Discharge Summary (Signed)
Physician Discharge Summary   Patient: Tina Frank MRN: [id number] DOB: [birth year]  Admit date:     12/22/2022  Discharge date: 12/26/22  Discharge Physician: Lynden Oxford  PCP: Sigmund Hazel, MD  Recommendations at discharge: Follow up with PCP in 1 week.  Follow up with Cardiology in 1 month.   Follow-up Information     Sigmund Hazel, MD. Schedule an appointment as soon as possible for a visit in 1 week(s).   Specialty: Family Medicine Why: with BMP lab to look at kidney and electrolytes Contact information: [address] [place] [address] [phone]                Discharge Diagnoses: Principal Problem:   Acute encephalopathy Active Problems:   Hypercalcemia   UTI (urinary tract infection)   AKI (acute kidney injury) (HCC)   Atrial fibrillation (HCC)   Elevated troponin   Chronic heart failure with preserved ejection fraction (HFpEF) [facility])  Hospital Course: Tina Frank is a 82 y.o. female with a history of diabetes mellitus type 2, hyperparathyroidism s/p parathyroidectomy, primary hypertension, chronic diastolic heart failure, CVA .  Patient presented secondary to worsening confusion and refusal to eat, drink or take medication and found to have acute metabolic encephalopathy secondary to UTI and complicated by associated AKI and hypercalcemia.    Assessment and Plan: Acute metabolic encephalopathy Presumed secondary to UTI, but could also be a component from hypercalcemia.  Mentation very close to baseline.   E. coli UTI Urine culture showed E. coli.  Currently on oral cefadroxil. Continue for total 7 days,   AKI Baseline creatinine of 0.9. Creatinine of 1.57 on admission. IV fluids started. Creatinine improved treated with IV fluid.   New onset atrial fibrillation Patient started on heparin IV.  Transthoracic Echocardiogram from 10/13/2022 that was significant for an LVEF of 60-65% with grade 1 diastolic dysfunction and mildly dilated  left atrium.  Patient has a history of bradyarrhythmia for which she was sent to cardiology in May 2024. Not a great candidate for long-term anticoagulation but fall risk is now lower due to her debility per son. Switching to Xarelto which is the preferred medication per family.   Elevated troponin Troponin elevated from 54 > 94 > 97. No associated chest pain. Cardiology called per chart review with no concern for ACS. Patient remains asymptomatic. Echocardiogram shows preserved EF.  No significant valvular abnormality.   Primary hypertension Patient is on clonidine, hydralazine as an outpatient which were held on admission.  Hypercalcemia History of primary hyperparathyroidism SP parathyroidectomy in 2014 Etiology of the hypercalcemia is not clear. Patient takes a lot of Tums and I do not think that she remembers how much she took. Calcium level improving with IV hydration. She does have history of primary hyperparathyroidism as well as history of hyperparathyroid ectomy in 2014. PTH is actually low and therefore less likely the etiology here. Vitamin D level unremarkable as well. Other workup pending. suspect mostly excessive oral intake of calcium as the etiology.   Diabetes mellitus type 2 Diet controlled. Well controlled. Last hemoglobin A1C of 6.0%.  Consultants:  none  Procedures performed:  Echocardiogram   DISCHARGE MEDICATION: Allergies as of 12/26/2022       Reactions   Abaloparatide Other (See Comments)   "Burred vision, lightheaded" (patient does not recall this in 2024)   Mobic [meloxicam] Hypertension   Crestor [rosuvastatin] Other (See Comments)   Muscle aches   Norvasc [amlodipine Besylate] Swelling, Other (See Comments)  Physician Discharge Summary   Patient: Tina Frank MRN: [id number] DOB: [birth year]  Admit date:     12/22/2022  Discharge date: 12/26/22  Discharge Physician: Lynden Oxford  PCP: Sigmund Hazel, MD  Recommendations at discharge: Follow up with PCP in 1 week.  Follow up with Cardiology in 1 month.   Follow-up Information     Sigmund Hazel, MD. Schedule an appointment as soon as possible for a visit in 1 week(s).   Specialty: Family Medicine Why: with BMP lab to look at kidney and electrolytes Contact information: [address] [place] [address] [phone]                Discharge Diagnoses: Principal Problem:   Acute encephalopathy Active Problems:   Hypercalcemia   UTI (urinary tract infection)   AKI (acute kidney injury) (HCC)   Atrial fibrillation (HCC)   Elevated troponin   Chronic heart failure with preserved ejection fraction (HFpEF) [facility])  Hospital Course: Tina Frank is a 82 y.o. female with a history of diabetes mellitus type 2, hyperparathyroidism s/p parathyroidectomy, primary hypertension, chronic diastolic heart failure, CVA .  Patient presented secondary to worsening confusion and refusal to eat, drink or take medication and found to have acute metabolic encephalopathy secondary to UTI and complicated by associated AKI and hypercalcemia.    Assessment and Plan: Acute metabolic encephalopathy Presumed secondary to UTI, but could also be a component from hypercalcemia.  Mentation very close to baseline.   E. coli UTI Urine culture showed E. coli.  Currently on oral cefadroxil. Continue for total 7 days,   AKI Baseline creatinine of 0.9. Creatinine of 1.57 on admission. IV fluids started. Creatinine improved treated with IV fluid.   New onset atrial fibrillation Patient started on heparin IV.  Transthoracic Echocardiogram from 10/13/2022 that was significant for an LVEF of 60-65% with grade 1 diastolic dysfunction and mildly dilated  left atrium.  Patient has a history of bradyarrhythmia for which she was sent to cardiology in May 2024. Not a great candidate for long-term anticoagulation but fall risk is now lower due to her debility per son. Switching to Xarelto which is the preferred medication per family.   Elevated troponin Troponin elevated from 54 > 94 > 97. No associated chest pain. Cardiology called per chart review with no concern for ACS. Patient remains asymptomatic. Echocardiogram shows preserved EF.  No significant valvular abnormality.   Primary hypertension Patient is on clonidine, hydralazine as an outpatient which were held on admission.  Hypercalcemia History of primary hyperparathyroidism SP parathyroidectomy in 2014 Etiology of the hypercalcemia is not clear. Patient takes a lot of Tums and I do not think that she remembers how much she took. Calcium level improving with IV hydration. She does have history of primary hyperparathyroidism as well as history of hyperparathyroid ectomy in 2014. PTH is actually low and therefore less likely the etiology here. Vitamin D level unremarkable as well. Other workup pending. suspect mostly excessive oral intake of calcium as the etiology.   Diabetes mellitus type 2 Diet controlled. Well controlled. Last hemoglobin A1C of 6.0%.  Consultants:  none  Procedures performed:  Echocardiogram   DISCHARGE MEDICATION: Allergies as of 12/26/2022       Reactions   Abaloparatide Other (See Comments)   "Burred vision, lightheaded" (patient does not recall this in 2024)   Mobic [meloxicam] Hypertension   Crestor [rosuvastatin] Other (See Comments)   Muscle aches   Norvasc [amlodipine Besylate] Swelling, Other (See Comments)  Physician Discharge Summary   Patient: Tina Frank MRN: [id number] DOB: [birth year]  Admit date:     12/22/2022  Discharge date: 12/26/22  Discharge Physician: Lynden Oxford  PCP: Sigmund Hazel, MD  Recommendations at discharge: Follow up with PCP in 1 week.  Follow up with Cardiology in 1 month.   Follow-up Information     Sigmund Hazel, MD. Schedule an appointment as soon as possible for a visit in 1 week(s).   Specialty: Family Medicine Why: with BMP lab to look at kidney and electrolytes Contact information: [address] [place] [address] [phone]                Discharge Diagnoses: Principal Problem:   Acute encephalopathy Active Problems:   Hypercalcemia   UTI (urinary tract infection)   AKI (acute kidney injury) (HCC)   Atrial fibrillation (HCC)   Elevated troponin   Chronic heart failure with preserved ejection fraction (HFpEF) [facility])  Hospital Course: Tina Frank is a 82 y.o. female with a history of diabetes mellitus type 2, hyperparathyroidism s/p parathyroidectomy, primary hypertension, chronic diastolic heart failure, CVA .  Patient presented secondary to worsening confusion and refusal to eat, drink or take medication and found to have acute metabolic encephalopathy secondary to UTI and complicated by associated AKI and hypercalcemia.    Assessment and Plan: Acute metabolic encephalopathy Presumed secondary to UTI, but could also be a component from hypercalcemia.  Mentation very close to baseline.   E. coli UTI Urine culture showed E. coli.  Currently on oral cefadroxil. Continue for total 7 days,   AKI Baseline creatinine of 0.9. Creatinine of 1.57 on admission. IV fluids started. Creatinine improved treated with IV fluid.   New onset atrial fibrillation Patient started on heparin IV.  Transthoracic Echocardiogram from 10/13/2022 that was significant for an LVEF of 60-65% with grade 1 diastolic dysfunction and mildly dilated  left atrium.  Patient has a history of bradyarrhythmia for which she was sent to cardiology in May 2024. Not a great candidate for long-term anticoagulation but fall risk is now lower due to her debility per son. Switching to Xarelto which is the preferred medication per family.   Elevated troponin Troponin elevated from 54 > 94 > 97. No associated chest pain. Cardiology called per chart review with no concern for ACS. Patient remains asymptomatic. Echocardiogram shows preserved EF.  No significant valvular abnormality.   Primary hypertension Patient is on clonidine, hydralazine as an outpatient which were held on admission.  Hypercalcemia History of primary hyperparathyroidism SP parathyroidectomy in 2014 Etiology of the hypercalcemia is not clear. Patient takes a lot of Tums and I do not think that she remembers how much she took. Calcium level improving with IV hydration. She does have history of primary hyperparathyroidism as well as history of hyperparathyroid ectomy in 2014. PTH is actually low and therefore less likely the etiology here. Vitamin D level unremarkable as well. Other workup pending. suspect mostly excessive oral intake of calcium as the etiology.   Diabetes mellitus type 2 Diet controlled. Well controlled. Last hemoglobin A1C of 6.0%.  Consultants:  none  Procedures performed:  Echocardiogram   DISCHARGE MEDICATION: Allergies as of 12/26/2022       Reactions   Abaloparatide Other (See Comments)   "Burred vision, lightheaded" (patient does not recall this in 2024)   Mobic [meloxicam] Hypertension   Crestor [rosuvastatin] Other (See Comments)   Muscle aches   Norvasc [amlodipine Besylate] Swelling, Other (See Comments)  Weight:       125.7 lb Date of Birth:  Apr 13, 1940       BSA:          1.624 m Patient Age:    82 years        BP:           160/63 mmHg Patient Gender: F               HR:           68 bpm. Exam Location:  Inpatient Procedure: Limited Echo, Limited Color Doppler and Cardiac Doppler Indications:    Atrial fibrillation  History:        Patient has prior history of Echocardiogram examinations, most                 recent 10/13/2022. Risk Factors:Hypertension, Dyslipidemia and                 Diabetes. CKD.  Sonographer:    Milda Smart Referring Phys: [id number] Onika Gudiel M Gabreal Worton  Sonographer Comments: Suboptimal subcostal window. Image acquisition challenging due to respiratory motion. IMPRESSIONS  1. Left ventricular ejection fraction, by estimation, is 60 to 65%. The left ventricle has normal function.  2. Right ventricular systolic function is normal. The right ventricular size is normal. Tricuspid regurgitation signal is inadequate for assessing PA pressure.  3. The mitral valve is  grossly normal. Trivial mitral valve regurgitation. No evidence of mitral stenosis.  4. The aortic valve is tricuspid. There is mild calcification of the aortic valve. Aortic valve regurgitation is trivial. Aortic valve sclerosis is present, with no evidence of aortic valve stenosis.  5. The inferior vena cava is normal in size with greater than 50% respiratory variability, suggesting right atrial pressure of 3 mmHg. Comparison(s): No significant change from prior study. FINDINGS  Left Ventricle: Left ventricular ejection fraction, by estimation, is 60 to 65%. The left ventricle has normal function. The left ventricular internal cavity size was normal in size. There is no left ventricular hypertrophy. Right Ventricle: The right ventricular size is normal. No increase in right ventricular wall thickness. Right ventricular systolic function is normal. Tricuspid regurgitation signal is inadequate for assessing PA pressure. Pericardium: There is no evidence of pericardial effusion. Presence of epicardial fat layer. Mitral Valve: The mitral valve is grossly normal. Trivial mitral valve regurgitation. No evidence of mitral valve stenosis. Tricuspid Valve: The tricuspid valve is grossly normal. Tricuspid valve regurgitation is trivial. No evidence of tricuspid stenosis. Aortic Valve: The aortic valve is tricuspid. There is mild calcification of the aortic valve. Aortic valve regurgitation is trivial. Aortic valve sclerosis is present, with no evidence of aortic valve stenosis. Pulmonic Valve: The pulmonic valve was grossly normal. Pulmonic valve regurgitation is trivial. No evidence of pulmonic stenosis. Venous: The inferior vena cava is normal in size with greater than 50% respiratory variability, suggesting right atrial pressure of 3 mmHg. Additional Comments: Spectral Doppler performed. Color Doppler performed.  LEFT VENTRICLE PLAX 2D LVIDd:         3.90 cm LVIDs:         2.50 cm LV PW:         1.00 cm LV IVS:         0.90 cm  LEFT ATRIUM         Index LA diam:    3.40 cm 2.09 cm/m  AORTIC VALVE LVOT Vmax:   129.00 cm/s LVOT Vmean:  93.400 cm/s LVOT VTI:    0.275  Physician Discharge Summary   Patient: Tina Frank MRN: [id number] DOB: [birth year]  Admit date:     12/22/2022  Discharge date: 12/26/22  Discharge Physician: Lynden Oxford  PCP: Sigmund Hazel, MD  Recommendations at discharge: Follow up with PCP in 1 week.  Follow up with Cardiology in 1 month.   Follow-up Information     Sigmund Hazel, MD. Schedule an appointment as soon as possible for a visit in 1 week(s).   Specialty: Family Medicine Why: with BMP lab to look at kidney and electrolytes Contact information: [address] [place] [address] [phone]                Discharge Diagnoses: Principal Problem:   Acute encephalopathy Active Problems:   Hypercalcemia   UTI (urinary tract infection)   AKI (acute kidney injury) (HCC)   Atrial fibrillation (HCC)   Elevated troponin   Chronic heart failure with preserved ejection fraction (HFpEF) [facility])  Hospital Course: Tina Frank is a 82 y.o. female with a history of diabetes mellitus type 2, hyperparathyroidism s/p parathyroidectomy, primary hypertension, chronic diastolic heart failure, CVA .  Patient presented secondary to worsening confusion and refusal to eat, drink or take medication and found to have acute metabolic encephalopathy secondary to UTI and complicated by associated AKI and hypercalcemia.    Assessment and Plan: Acute metabolic encephalopathy Presumed secondary to UTI, but could also be a component from hypercalcemia.  Mentation very close to baseline.   E. coli UTI Urine culture showed E. coli.  Currently on oral cefadroxil. Continue for total 7 days,   AKI Baseline creatinine of 0.9. Creatinine of 1.57 on admission. IV fluids started. Creatinine improved treated with IV fluid.   New onset atrial fibrillation Patient started on heparin IV.  Transthoracic Echocardiogram from 10/13/2022 that was significant for an LVEF of 60-65% with grade 1 diastolic dysfunction and mildly dilated  left atrium.  Patient has a history of bradyarrhythmia for which she was sent to cardiology in May 2024. Not a great candidate for long-term anticoagulation but fall risk is now lower due to her debility per son. Switching to Xarelto which is the preferred medication per family.   Elevated troponin Troponin elevated from 54 > 94 > 97. No associated chest pain. Cardiology called per chart review with no concern for ACS. Patient remains asymptomatic. Echocardiogram shows preserved EF.  No significant valvular abnormality.   Primary hypertension Patient is on clonidine, hydralazine as an outpatient which were held on admission.  Hypercalcemia History of primary hyperparathyroidism SP parathyroidectomy in 2014 Etiology of the hypercalcemia is not clear. Patient takes a lot of Tums and I do not think that she remembers how much she took. Calcium level improving with IV hydration. She does have history of primary hyperparathyroidism as well as history of hyperparathyroid ectomy in 2014. PTH is actually low and therefore less likely the etiology here. Vitamin D level unremarkable as well. Other workup pending. suspect mostly excessive oral intake of calcium as the etiology.   Diabetes mellitus type 2 Diet controlled. Well controlled. Last hemoglobin A1C of 6.0%.  Consultants:  none  Procedures performed:  Echocardiogram   DISCHARGE MEDICATION: Allergies as of 12/26/2022       Reactions   Abaloparatide Other (See Comments)   "Burred vision, lightheaded" (patient does not recall this in 2024)   Mobic [meloxicam] Hypertension   Crestor [rosuvastatin] Other (See Comments)   Muscle aches   Norvasc [amlodipine Besylate] Swelling, Other (See Comments)

## 2022-12-26 NOTE — Plan of Care (Signed)
  Problem: Education: Goal: Knowledge of the prescribed therapeutic regimen will improve Outcome: Progressing   Problem: Activity: Goal: Ability to tolerate increased activity will improve Outcome: Progressing   Problem: Pain Management: Goal: Pain level will decrease with appropriate interventions Outcome: Progressing   Problem: Education: Goal: Knowledge of disease or condition will improve Outcome: Progressing   Problem: Coping: Goal: Will verbalize positive feelings about self Outcome: Progressing

## 2022-12-26 NOTE — Evaluation (Signed)
Physical Therapy Evaluation Patient Details Name: Tina Frank MRN: [id number] DOB: 1940/04/18 Today's Date: 12/26/2022  History of Present Illness  82 y.o. female with a history of diabetes mellitus type 2, hyperparathyroidism s/p parathyroidectomy, primary hypertension, chronic diastolic heart failure, internal capsule infarct August 2024, R reverse TSA 04/27/22 .  Patient presented secondary to worsening confusion and refusal to eat, drink or take medication and found to have acute metabolic encephalopathy secondary to UTI and complicated by associated AKI and hypercalcemia  Clinical Impression  Pt admitted with above diagnosis. Max assist for supine to sit, max assist sit to stand. Pt only able to stand for ~20 seconds with RW 2* BLEs feeling like they were going to "give out". Transferred pt to recliner using Careers information officer. Patient will benefit from continued inpatient follow up therapy, <3 hours/day.  Pt currently with functional limitations due to the deficits listed below (see PT Problem List). Pt will benefit from acute skilled PT to increase their independence and safety with mobility to allow discharge.           If plan is discharge home, recommend the following: A lot of help with bathing/dressing/bathroom;Two people to help with walking and/or transfers;Assistance with cooking/housework;Assist for transportation;Help with stairs or ramp for entrance   Can travel by private vehicle   No    Equipment Recommendations None recommended by PT  Recommendations for Other Services       Functional Status Assessment Patient has had a recent decline in their functional status and demonstrates the ability to make significant improvements in function in a reasonable and predictable amount of time.     Precautions / Restrictions Precautions Precautions: Fall Precaution Comments: hx of falls (with R shoulder fx in Feb 2024) Restrictions Weight Bearing Restrictions: No       Mobility  Bed Mobility Overal bed mobility: Needs Assistance Bed Mobility: Supine to Sit     Supine to sit: Max assist, HOB elevated     General bed mobility comments: pt able to assist with advancing BLEs to edge of bed, max A to raise trunk    Transfers Overall transfer level: Needs assistance Equipment used: Rolling walker (2 wheels) Transfers: Sit to/from Stand Sit to Stand: From elevated surface, Max assist           General transfer comment: sit to stand with RW with max assist, max verbal/manual cues for technique, bed highly elevated; pt only able to stand briefly bc she felt her knees were "giving out". Then sit to stand with max A from elevated bed with Stedy. Dependent transfer to recliner using Stedy. Transfer via Lift Equipment: Stedy  Ambulation/Gait               General Gait Details: unable  Acupuncturist Bed    Modified Rankin (Stroke Patients Only)       Balance Overall balance assessment: Needs assistance Sitting-balance support: Feet supported, No upper extremity supported Sitting balance-Leahy Scale: Fair     Standing balance support: Bilateral upper extremity supported, Reliant on assistive device for balance, During functional activity Standing balance-Leahy Scale: Poor                               Pertinent Vitals/Pain Pain Assessment Pain Assessment: Faces Faces Pain Scale: Hurts even more Breathing: occasional labored breathing, short period  of hyperventilation Negative Vocalization: occasional moan/groan, low speech, negative/disapproving quality Facial Expression: sad, frightened, frown Body Language: tense, distressed pacing, fidgeting Consolability: distracted or reassured by voice/touch PAINAD Score: 5 Pain Location: B shoulders/knees Pain Descriptors / Indicators: Moaning, Grimacing    Home Living Family/patient expects to be discharged to:: Skilled nursing  facility                        Prior Function Prior Level of Function : Needs assist       Physical Assist : Mobility (physical);ADLs (physical)     Mobility Comments: was walking short distances with RW in PT at SNF; reports she could pull up to stand with grab bar in bathroom for toilet transfers ADLs Comments: assist needed     Extremity/Trunk Assessment   Upper Extremity Assessment Upper Extremity Assessment: Defer to OT evaluation;Generalized weakness    Lower Extremity Assessment Lower Extremity Assessment: Generalized weakness;RLE deficits/detail;LLE deficits/detail RLE Deficits / Details: knee ext 4/5 RLE Sensation: WNL LLE Deficits / Details: knee ext 4/5 LLE Sensation: WNL       Communication   Communication Communication: No apparent difficulties Cueing Techniques: Verbal cues;Tactile cues  Cognition Arousal: Alert Behavior During Therapy: WFL for tasks assessed/performed, Anxious Overall Cognitive Status: Within Functional Limits for tasks assessed                                 General Comments: fearful of falling        General Comments      Exercises General Exercises - Lower Extremity Ankle Circles/Pumps: AROM, Both, 5 reps, Supine Short Arc Quad: AROM, Both, 5 reps, Supine   Assessment/Plan    PT Assessment Patient needs continued PT services  PT Problem List Decreased strength;Decreased activity tolerance;Decreased mobility;Decreased balance       PT Treatment Interventions Gait training;Functional mobility training;Therapeutic activities;Therapeutic exercise;Patient/family education    PT Goals (Current goals can be found in the Care Plan section)  Acute Rehab PT Goals Patient Stated Goal: to get stronger PT Goal Formulation: With patient/family Time For Goal Achievement: 01/09/23 Potential to Achieve Goals: Good    Frequency Min 1X/week     Co-evaluation               AM-PAC PT "6 Clicks"  Mobility  Outcome Measure Help needed turning from your back to your side while in a flat bed without using bedrails?: A Lot Help needed moving from lying on your back to sitting on the side of a flat bed without using bedrails?: A Lot Help needed moving to and from a bed to a chair (including a wheelchair)?: Total Help needed standing up from a chair using your arms (e.g., wheelchair or bedside chair)?: A Lot Help needed to walk in hospital room?: Total Help needed climbing 3-5 steps with a railing? : Total 6 Click Score: 9    End of Session Equipment Utilized During Treatment: Gait belt Activity Tolerance: Patient limited by fatigue Patient left: in chair;with family/visitor present;with call bell/phone within reach Nurse Communication: Mobility status;Need for lift equipment PT Visit Diagnosis: History of falling (Z91.81);Difficulty in walking, not elsewhere classified (R26.2)    Time: 5366-4403 PT Time Calculation (min) (ACUTE ONLY): 49 min   Charges:   PT Evaluation $PT Eval Moderate Complexity: 1 Mod PT Treatments $Therapeutic Activity: 23-37 mins PT General Charges $$ ACUTE PT VISIT: 1 Visit  Ralene Bathe Kistler PT 12/26/2022  Acute Rehabilitation Services  Office [phone]

## 2022-12-26 NOTE — Plan of Care (Signed)
CHL Tonsillectomy/Adenoidectomy, Postoperative PEDS care plan entered in error.

## 2022-12-26 NOTE — NC FL2 (Signed)
[place] MEDICAID FL2 LEVEL OF CARE FORM     IDENTIFICATION  Patient Name: Tina Frank Birthdate: 08-27-1940 Sex: female Admission Date (Current Location): 12/22/2022  [facility] and [place] Number:  Producer, television/film/video and Address:  [facility],  [address] [place], [place] [address]      Provider Number: [id number]  Attending Physician Name and Address:  Rolly Salter, MD  Relative Name and Phone Number:  Joylin, Dichiara)  [phone] [facility])    Current Level of Care: Hospital Recommended Level of Care: Skilled Nursing Facility Prior Approval Number:    Date Approved/Denied:   PASRR Number: [id number]  Discharge Plan: SNF    Current Diagnoses: Patient Active Problem List   Diagnosis Date Noted   Acute encephalopathy 12/22/2022   UTI (urinary tract infection) 12/22/2022   AKI (acute kidney injury) (HCC) 12/22/2022   Atrial fibrillation (HCC) 12/22/2022   Elevated troponin 12/22/2022   Chronic heart failure with preserved ejection fraction (HFpEF) (HCC) 12/22/2022   IDA (iron deficiency anemia) 12/09/2022   Slow transit constipation 11/26/2022   Pulmonary edema 11/26/2022   History of stroke 10/12/2022   Hypercalcemia 10/12/2022   Hypomagnesemia 10/12/2022   Positive anti-CCP test 10/12/2022   Bradycardia 10/12/2022   DNR (do not resuscitate)/DNI(Do Not Intubate) 10/12/2022   Arthritis of left ankle 08/15/2020   Benign neoplasm of adrenal gland 08/15/2020   Cyst of kidney, acquired 08/15/2020   Gastroesophageal reflux disease 08/15/2020   Hardening of the aorta (main artery of the heart) (HCC) 08/15/2020   History of primary hyperparathyroidism 08/15/2020   Gout 08/15/2020   Joint pain 08/15/2020   Oral phase dysphagia 08/15/2020   Osteoporosis 08/15/2020   Prediabetes 08/15/2020   Presence of left artificial shoulder joint 08/15/2020   Pure hypercholesterolemia 08/15/2020   Visual disturbance 08/15/2020   Vitamin D deficiency  08/15/2020   Voice hoarseness 08/15/2020   Chronic arthropathy 04/25/2020   Decubitus skin ulcer 04/12/2019   Hypokalemia 03/15/2019   Prosthetic joint infection (HCC) 02/09/2019   Degenerative joint disease of left shoulder 02/14/2015   Pre-syncope 10/06/2013   Leukocytosis 10/06/2013   Hypotension 10/06/2013   Osteoarthritis 07/27/2013   Essential hypertension 10/28/2011   DM (diabetes mellitus) (HCC) 10/28/2011   Hyperlipidemia 10/28/2011   Reflux esophagitis    Herpes zoster    Shingles     Orientation RESPIRATION BLADDER Height & Weight     Self, Time, Situation, Place  Normal Incontinent Weight: 126 lb 1.7 oz (57.2 kg) Height:  5\' 5"  (165.1 cm)  BEHAVIORAL SYMPTOMS/MOOD NEUROLOGICAL BOWEL NUTRITION STATUS      Incontinent Diet (Heart Healthy)  AMBULATORY STATUS COMMUNICATION OF NEEDS Skin   Limited Assist Verbally Normal                       Personal Care Assistance Level of Assistance  Bathing, Feeding, Dressing Bathing Assistance: Limited assistance Feeding assistance: Independent Dressing Assistance: Limited assistance     Functional Limitations Info  Sight, Hearing, Speech Sight Info: Adequate Hearing Info: Adequate Speech Info: Adequate    SPECIAL CARE FACTORS FREQUENCY  PT (By licensed PT), OT (By licensed OT)     PT Frequency: 5 x a week OT Frequency: 5 x a week            Contractures Contractures Info: Not present    Additional Factors Info  Code Status, Allergies Code Status Info: DNR Allergies Info: Abaloparatide  Mobic (Meloxicam)  Crestor (Rosuvastatin)  Norvasc (Amlodipine  Besylate)  Simvastatin  Sulfa Antibiotics  Zetia (Ezetimibe           Current Medications (12/26/2022):  This is the current hospital active medication list Current Facility-Administered Medications  Medication Dose Route Frequency Provider Last Rate Last Admin   acetaminophen (TYLENOL) tablet 650 mg  650 mg Oral Q6H PRN Opyd, Lavone Neri, MD   650 mg at  12/26/22 0932   Or   acetaminophen (TYLENOL) suppository 650 mg  650 mg Rectal Q6H PRN Opyd, Lavone Neri, MD       aspirin EC tablet 81 mg  81 mg Oral Daily Rolly Salter, MD   81 mg at 12/26/22 1007   cefadroxil (DURICEF) capsule 500 mg  500 mg Oral BID Rolly Salter, MD   500 mg at 12/26/22 0932   heparin ADULT infusion 100 units/mL (25000 units/253mL)  1,000 Units/hr Intravenous Continuous Rolly Salter, MD       insulin aspart (novoLOG) injection 0-15 Units  0-15 Units Subcutaneous TID WC Narda Bonds, MD   3 Units at 12/25/22 1215   ondansetron (ZOFRAN) tablet 4 mg  4 mg Oral Q6H PRN Opyd, Lavone Neri, MD       Or   ondansetron (ZOFRAN) injection 4 mg  4 mg Intravenous Q6H PRN Opyd, Lavone Neri, MD       Oral care mouth rinse  15 mL Mouth Rinse 4 times per day Briscoe Deutscher, MD   15 mL at 12/26/22 0935   Oral care mouth rinse  15 mL Mouth Rinse PRN Opyd, Lavone Neri, MD       pantoprazole (PROTONIX) EC tablet 40 mg  40 mg Oral Daily Opyd, Lavone Neri, MD   40 mg at 12/26/22 0932   [START ON 12/27/2022] potassium chloride SA (KLOR-CON M) CR tablet 20 mEq  20 mEq Oral Daily Rolly Salter, MD       Rivaroxaban Carlena Hurl) tablet 15 mg  15 mg Oral Q supper Rolly Salter, MD       sodium chloride flush (NS) 0.9 % injection 3 mL  3 mL Intravenous Q12H Opyd, Lavone Neri, MD   3 mL at 12/25/22 1006     Discharge Medications: Please see discharge summary for a list of discharge medications.  Relevant Imaging Results:  Relevant Lab Results:   Additional Information SSN:[id number]  Tina Frank Ptacek, LCSW

## 2022-12-26 NOTE — Discharge Instructions (Signed)

## 2022-12-27 DIAGNOSIS — G9340 Encephalopathy, unspecified: Secondary | ICD-10-CM | POA: Diagnosis not present

## 2022-12-27 LAB — CULTURE, BLOOD (ROUTINE X 2): Culture: NO GROWTH

## 2022-12-27 LAB — GLUCOSE, CAPILLARY
Glucose-Capillary: 98 mg/dL (ref 70–99)
Glucose-Capillary: 124 mg/dL — ABNORMAL HIGH (ref 70–99)
Glucose-Capillary: 109 mg/dL — ABNORMAL HIGH (ref 70–99)
Glucose-Capillary: 130 mg/dL — ABNORMAL HIGH (ref 70–99)

## 2022-12-27 MED ORDER — HYDRALAZINE HCL 25 MG PO TABS
25.0000 mg | ORAL_TABLET | Freq: Three times a day (TID) | ORAL | Status: DC
  Administered 2022-12-27 – 2022-12-30 (×9): 25 mg via ORAL
  Filled 2022-12-27 (×9): qty 1

## 2022-12-27 NOTE — TOC Progression Note (Signed)
Transition of Care [facility]) - Progression Note    Patient Details  Name: Tina Frank MRN: [id number] Date of Birth: 1941/01/23  [facility]) CM/SW Contact  Larrie Kass, LCSW Phone Number: 12/27/2022, 11:01 AM  Clinical Narrative:    Pt's insurance auth for SNF placement is still pending .TOC to follow.    Expected Discharge Plan: Skilled Nursing Facility Barriers to Discharge: Insurance Authorization  Expected Discharge Plan and Services       Living arrangements for the past 2 months: Skilled Nursing Facility Expected Discharge Date: 12/26/22                                     Social Determinants of Health (SDOH) Interventions SDOH Screenings   Food Insecurity: No Food Insecurity (12/22/2022)  Housing: Low Risk  (12/22/2022)  Transportation Needs: No Transportation Needs (12/22/2022)  Utilities: Not At Risk (12/22/2022)  Depression (PHQ2-9): Low Risk  (07/19/2019)  Tobacco Use: Medium Risk (12/22/2022)    Readmission Risk Interventions    04/28/2022    3:15 PM  Readmission Risk Prevention Plan  Transportation Screening Complete  PCP or Specialist Appt within 5-7 Days Complete  Home Care Screening Complete  Medication Review (RN CM) Complete

## 2022-12-27 NOTE — Progress Notes (Signed)
[facility] PROGRESS NOTE  Patient: Tina Frank RSW:[id number]   PCP: Sigmund Hazel, MD DOB: July 08, 1940   DOA: 12/22/2022   DOS: 12/27/2022    Subjective: No acute complaint.  No nausea or vomiting.  Objective:  Vitals:   12/26/22 2146 12/27/22 0500 12/27/22 0614 12/27/22 1209  BP: (!) 140/56  (!) 148/65 (!) 143/59  Pulse:   (!) 50 (!) 48  Resp: 16  16 16   Temp: 98.2 F (36.8 C)  97.6 F (36.4 C) (!) 97.5 F (36.4 C)  TempSrc: Oral  Oral Oral  SpO2: 97%  99% 97%  Weight:  55.7 kg    Height:       Clear to auscultation. S1-S2 present. No edema.  Assessment and plan: Hypercalcemia. Oral intake adequate. Currently low concern for further workup in the hospital.  Deconditioning. Awaiting placement.  Author: Lynden Oxford, MD [facility] 12/27/2022 7:00 PM   If 7PM-7AM, please contact night-coverage at [email or web address]

## 2022-12-28 ENCOUNTER — Inpatient Hospital Stay (HOSPITAL_COMMUNITY): Payer: Medicare PPO

## 2022-12-28 DIAGNOSIS — G9340 Encephalopathy, unspecified: Secondary | ICD-10-CM | POA: Diagnosis not present

## 2022-12-28 LAB — COMPREHENSIVE METABOLIC PANEL
ALT: 14 U/L (ref 0–44)
AST: 17 U/L (ref 15–41)
Albumin: 3 g/dL — ABNORMAL LOW (ref 3.5–5.0)
Alkaline Phosphatase: 85 U/L (ref 38–126)
Anion gap: 7 (ref 5–15)
BUN: 15 mg/dL (ref 8–23)
CO2: 26 mmol/L (ref 22–32)
Calcium: 12.5 mg/dL — ABNORMAL HIGH (ref 8.9–10.3)
Chloride: 101 mmol/L (ref 98–111)
Creatinine, Ser: 0.84 mg/dL (ref 0.44–1.00)
GFR, Estimated: >60 mL/min (ref >60)
Glucose, Bld: 114 mg/dL — ABNORMAL HIGH (ref 70–99)
Potassium: 3.8 mmol/L (ref 3.5–5.1)
Sodium: 134 mmol/L — ABNORMAL LOW (ref 135–145)
Total Bilirubin: 0.8 mg/dL (ref 0.3–1.2)
Total Protein: 6.5 g/dL (ref 6.5–8.1)

## 2022-12-28 LAB — GLUCOSE, CAPILLARY
Glucose-Capillary: 103 mg/dL — ABNORMAL HIGH (ref 70–99)
Glucose-Capillary: 120 mg/dL — ABNORMAL HIGH (ref 70–99)
Glucose-Capillary: 108 mg/dL — ABNORMAL HIGH (ref 70–99)
Glucose-Capillary: 97 mg/dL (ref 70–99)

## 2022-12-28 LAB — AMMONIA: Ammonia: <10 umol/L (ref 9–35)

## 2022-12-28 LAB — T4, FREE: Free T4: 1.46 ng/dL — ABNORMAL HIGH (ref 0.61–1.12)

## 2022-12-28 LAB — CBC WITH DIFFERENTIAL/PLATELET
Abs Immature Granulocytes: 0.04 10*3/uL (ref 0.00–0.07)
Basophils Absolute: 0 10*3/uL (ref 0.0–0.1)
Basophils Relative: 0 %
Eosinophils Absolute: 0.3 10*3/uL (ref 0.0–0.5)
Eosinophils Relative: 3 %
HCT: 34.5 % — ABNORMAL LOW (ref 36.0–46.0)
Hemoglobin: 11.3 g/dL — ABNORMAL LOW (ref 12.0–15.0)
Immature Granulocytes: 0 %
Lymphocytes Relative: 18 %
Lymphs Abs: 1.6 10*3/uL (ref 0.7–4.0)
MCH: 30.5 pg (ref 26.0–34.0)
MCHC: 32.8 g/dL (ref 30.0–36.0)
MCV: 93 fL (ref 80.0–100.0)
Monocytes Absolute: 0.8 10*3/uL (ref 0.1–1.0)
Monocytes Relative: 9 %
Neutro Abs: 6.3 10*3/uL (ref 1.7–7.7)
Neutrophils Relative %: 70 %
Platelets: 230 10*3/uL (ref 150–400)
RBC: 3.71 MIL/uL — ABNORMAL LOW (ref 3.87–5.11)
RDW: 14.7 % (ref 11.5–15.5)
WBC: 9 10*3/uL (ref 4.0–10.5)
nRBC: 0 % (ref 0.0–0.2)

## 2022-12-28 LAB — VITAMIN B12: Vitamin B-12: 362 pg/mL (ref 180–914)

## 2022-12-28 LAB — TSH: TSH: 1.499 u[IU]/mL (ref 0.350–4.500)

## 2022-12-28 MED ORDER — CYANOCOBALAMIN 1000 MCG/ML IJ SOLN
1000.0000 ug | Freq: Once | INTRAMUSCULAR | Status: AC
  Administered 2022-12-28: 1000 ug via SUBCUTANEOUS
  Filled 2022-12-28: qty 1

## 2022-12-28 MED ORDER — THIAMINE HCL 100 MG/ML IJ SOLN
100.0000 mg | Freq: Once | INTRAMUSCULAR | Status: AC
Start: 2022-12-28 — End: 2022-12-28
  Administered 2022-12-28: 100 mg via INTRAVENOUS
  Filled 2022-12-28: qty 2

## 2022-12-28 MED ORDER — SODIUM CHLORIDE 0.9 % IV SOLN: INTRAVENOUS | Status: AC

## 2022-12-28 NOTE — Progress Notes (Signed)
[facility] Progress Note Patient: Tina Frank ZDG:[id number] DOB: [birth year] DOA: 12/22/2022  DOS: the patient was seen and examined on 12/28/2022  Hospital Course: Tina Frank is a 82 y.o. female with a history of diabetes mellitus type 2, hyperparathyroidism s/p parathyroidectomy, primary hypertension, chronic diastolic heart failure, CVA .  Patient presented secondary to worsening confusion and refusal to eat, drink or take medication and found to have acute metabolic encephalopathy secondary to UTI and complicated by associated AKI and hypercalcemia.    Assessment and Plan: Acute metabolic encephalopathy Presumed secondary to UTI, but could also be a component from hypercalcemia.  Mentation improved originally but on 10/20 becomes confused again.  Initiating extensive workup including B12 which is on the low side.  B1 pending.  TSH and free T4 pending.  Calcium level elevated again and therefore initiating IV fluids again.  EEG and MRI brain also ordered as RN reports the patient actually had waxing and waning mentation.   E. coli UTI Urine culture showed E. coli.  Currently on oral cefadroxil. Completed total 7 days,   AKI Baseline creatinine of 0.9. Creatinine of 1.57 on admission. IV fluids started. Creatinine improved treated with IV fluid.   New onset atrial fibrillation Patient started on heparin IV.  Transthoracic Echocardiogram from 10/13/2022 that was significant for an LVEF of 60-65% with grade 1 diastolic dysfunction and mildly dilated left atrium.  Patient has a history of bradyarrhythmia for which she was sent to cardiology in May 2024. Not a great candidate for long-term anticoagulation but fall risk is now lower due to her debility per son. Switching to Xarelto which is the preferred medication per family.   Elevated troponin Troponin elevated from 54 > 94 > 97. No associated chest pain. Cardiology called per chart review with no concern for ACS. Patient  remains asymptomatic. Echocardiogram shows preserved EF.  No significant valvular abnormality.   Primary hypertension Patient is on clonidine, hydralazine as an outpatient which were held on admission.   Hypercalcemia History of primary hyperparathyroidism SP parathyroidectomy in 2014 Etiology of the hypercalcemia is not clear. Patient takes a lot of Tums and I do not think that she remembers how much she took. Calcium level improved with IV hydration She does have history of primary hyperparathyroidism as well as history of hyperparathyroid ectomy in 2014. PTH is actually low and therefore less likely the etiology here. Vitamin D level unremarkable as well. Other workup pending. Originally suspected to have oral intake as a primary etiology of hypercalcemia with prominence but now appears to have some underlying pathology.  SPEP ordered.  Will discuss with nephrology.   Diabetes mellitus type 2 Diet controlled. Well controlled. Last hemoglobin A1C of 6.0%.   Subjective: Confused.  Unable to answer questions clearly.  Repeating same sentence again and again.  No nausea no vomiting.  No diarrhea reported by the RN.  Physical Exam: General: in Mild distress, No Rash Cardiovascular: S1 and S2 Present, No Murmur Respiratory: Good respiratory effort, Bilateral Air entry present. No Crackles, No wheezes Abdomen: Bowel Sound present, No tenderness Extremities: No edema Neuro: Alert and not oriented.  Unable to follow commands.  Unable to answer questions clearly.  No dysarthria.  Although no other motor or sensory deficit identified. Asterixis present.  Data Reviewed: I have Reviewed nursing notes, Vitals, and Lab results. Since last encounter, pertinent lab results CBC and CMP   . I have ordered test including CBC and BMP  . I have ordered imaging MRI  brain and EEG  .   Disposition: Status is: Inpatient Remains inpatient appropriate because: Need further workup for  mentation  Rivaroxaban (XARELTO) tablet 15 mg   Family Communication: Discussed with son on the phone. Level of care: Telemetry   Vitals:   12/28/22 0500 12/28/22 0606 12/28/22 0636 12/28/22 1325  BP:  (!) 139/49 120/66 (!) 147/65  Pulse:  (!) 50 (!) 50 61  Resp:  18 18 18   Temp:  (!) 97.5 F (36.4 C) 97.6 F (36.4 C) 97.6 F (36.4 C)  TempSrc:  Oral Oral Oral  SpO2:  99% 99% 97%  Weight: 57.1 kg     Height:         Author: Lynden Oxford, MD 12/28/2022 6:09 PM  Please look on [email or web address] to find out who is on call.

## 2022-12-28 NOTE — TOC Progression Note (Signed)
Transition of Care [facility]) - Progression Note    Patient Details  Name: Tina Frank MRN: [id number] Date of Birth: 12-Aug-1940  Transition [facility] Care [facility]) CM/SW Contact  Darleene Cleaver, [place] Phone Number: 12/28/2022, 12:51 PM  Clinical Narrative:     Authorization for [facility], is still pending per [facility], reference number [id number].  TOC to continue to follow patient's progress throughout discharge planning.  Expected Discharge Plan: Skilled Nursing Facility Barriers to Discharge: Insurance Authorization  Expected Discharge Plan and Services  [facility] SNF.     Living arrangements for the past 2 months: Skilled Nursing Facility Expected Discharge Date: 12/26/22                                     Social Determinants of Health (SDOH) Interventions SDOH Screenings   Food Insecurity: No Food Insecurity (12/22/2022)  Housing: Low Risk  (12/22/2022)  Transportation Needs: No Transportation Needs (12/22/2022)  Utilities: Not At Risk (12/22/2022)  Depression (PHQ2-9): Low Risk  (07/19/2019)  Tobacco Use: Medium Risk (12/22/2022)    Readmission Risk Interventions    04/28/2022    3:15 PM  Readmission Risk Prevention Plan  Transportation Screening Complete  PCP or Specialist Appt within 5-7 Days Complete  Home Care Screening Complete  Medication Review (RN CM) Complete

## 2022-12-29 ENCOUNTER — Inpatient Hospital Stay (HOSPITAL_COMMUNITY)
Admit: 2022-12-29 | Discharge: 2022-12-29 | Disposition: A | Discharge status: Home / Self Care | Payer: Medicare PPO | Attending: Hematology and Oncology | Admitting: Hematology and Oncology

## 2022-12-29 DIAGNOSIS — G9340 Encephalopathy, unspecified: Secondary | ICD-10-CM | POA: Diagnosis not present

## 2022-12-29 DIAGNOSIS — R569 Unspecified convulsions: Secondary | ICD-10-CM | POA: Diagnosis not present

## 2022-12-29 LAB — CBC
HCT: 32.2 % — ABNORMAL LOW (ref 36.0–46.0)
Hemoglobin: 10.4 g/dL — ABNORMAL LOW (ref 12.0–15.0)
MCH: 29.9 pg (ref 26.0–34.0)
MCHC: 32.3 g/dL (ref 30.0–36.0)
MCV: 92.5 fL (ref 80.0–100.0)
Platelets: 219 10*3/uL (ref 150–400)
RBC: 3.48 MIL/uL — ABNORMAL LOW (ref 3.87–5.11)
RDW: 14.7 % (ref 11.5–15.5)
WBC: 8.6 10*3/uL (ref 4.0–10.5)
nRBC: 0 % (ref 0.0–0.2)

## 2022-12-29 LAB — GLUCOSE, CAPILLARY
Glucose-Capillary: 106 mg/dL — ABNORMAL HIGH (ref 70–99)
Glucose-Capillary: 163 mg/dL — ABNORMAL HIGH (ref 70–99)
Glucose-Capillary: 109 mg/dL — ABNORMAL HIGH (ref 70–99)
Glucose-Capillary: 137 mg/dL — ABNORMAL HIGH (ref 70–99)

## 2022-12-29 LAB — BASIC METABOLIC PANEL
Anion gap: 7 (ref 5–15)
BUN: 20 mg/dL (ref 8–23)
CO2: 24 mmol/L (ref 22–32)
Calcium: 12.3 mg/dL — ABNORMAL HIGH (ref 8.9–10.3)
Chloride: 102 mmol/L (ref 98–111)
Creatinine, Ser: 1.13 mg/dL — ABNORMAL HIGH (ref 0.44–1.00)
GFR, Estimated: 48 mL/min — ABNORMAL LOW (ref >60)
Glucose, Bld: 117 mg/dL — ABNORMAL HIGH (ref 70–99)
Potassium: 3.6 mmol/L (ref 3.5–5.1)
Sodium: 133 mmol/L — ABNORMAL LOW (ref 135–145)

## 2022-12-29 MED ORDER — SODIUM CHLORIDE 0.9 % IV SOLN: INTRAVENOUS | Status: AC

## 2022-12-29 NOTE — TOC Progression Note (Signed)
Transition of Care [facility]) - Progression Note    Patient Details  Name: Tina Frank MRN: [id number] Date of Birth: 11-07-1940  Transition of Care [facility]) CM/SW Contact  Larrie Kass, LCSW Phone Number: 12/29/2022, 2:48 PM  Clinical Narrative:   pt's insurance [employer] Harvey is still pending for SNF placement . TOC to follow.   Expected Discharge Plan: Skilled Nursing Facility Barriers to Discharge: Insurance Authorization  Expected Discharge Plan and Services       Living arrangements for the past 2 months: Skilled Nursing Facility Expected Discharge Date: 12/26/22                                     Social Determinants of Health (SDOH) Interventions SDOH Screenings   Food Insecurity: No Food Insecurity (12/22/2022)  Housing: Low Risk  (12/22/2022)  Transportation Needs: No Transportation Needs (12/22/2022)  Utilities: Not At Risk (12/22/2022)  Depression (PHQ2-9): Low Risk  (07/19/2019)  Tobacco Use: Medium Risk (12/22/2022)    Readmission Risk Interventions    04/28/2022    3:15 PM  Readmission Risk Prevention Plan  Transportation Screening Complete  PCP or Specialist Appt within 5-7 Days Complete  Home Care Screening Complete  Medication Review (RN CM) Complete

## 2022-12-29 NOTE — Procedures (Signed)
Patient Name: [name]  MRN: [id number]  Epilepsy Attending: [name]  Referring Physician/Provider: Rolly Salter, MD  Date: 12/29/2022 Duration: 25.08 mins  Patient history: 82yo F with ams getting eeg to evaluate for seizure  Level of alertness: Awake  AEDs during EEG study: None  Technical aspects: This EEG study was done with scalp electrodes positioned according to the 10-20 International system of electrode placement. Electrical activity was reviewed with band pass filter of 1-70Hz , sensitivity of 7 uV/mm, display speed of 25mm/sec with a 60Hz  notched filter applied as appropriate. EEG data were recorded continuously and digitally stored.  Video monitoring was available and reviewed as appropriate.  Description: The posterior dominant rhythm consists of 8 Hz activity of moderate voltage (25-35 uV) seen predominantly in posterior head regions, symmetric and reactive to eye opening and eye closing. EEG showed intermittent generalized 3 to 6 Hz theta-delta slowing. Hyperventilation and photic stimulation were not performed.     ABNORMALITY - Intermittent slow, generalized  IMPRESSION: This study is suggestive of mild to moderate diffuse encephalopathy. No seizures or epileptiform discharges were seen throughout the recording.  [name] [name]

## 2022-12-29 NOTE — Progress Notes (Signed)
[facility] Progress Note Patient: Carlethia Gornick AOZ:[id number] DOB: 1940-07-19 DOA: 12/22/2022  DOS: the patient was seen and examined on 12/29/2022  Hospital Course: Ebone Delaguila is a 82 y.o. female with a history of diabetes mellitus type 2, hyperparathyroidism s/p parathyroidectomy, primary hypertension, chronic diastolic heart failure, CVA .  Patient presented secondary to worsening confusion and refusal to eat, drink or take medication and found to have acute metabolic encephalopathy secondary to UTI and complicated by associated AKI and hypercalcemia.    Assessment and Plan: Acute metabolic encephalopathy Presumed secondary to UTI, but could also be a component from hypercalcemia.  Mentation improved originally but on 10/20 becomes confused again.  Initiating extensive workup including B12 which is on the low side.  B1 pending.  TSH and free T4 pending.  Calcium level elevated again and therefore initiating IV fluids again.  EEG and MRI brain also ordered as RN reports the patient actually had waxing and waning mentation.  Hypercalcemia History of primary hyperparathyroidism SP parathyroidectomy in 2014 Etiology of the hypercalcemia is not clear. Patient takes a lot of Tums and I do not think that she remembers how much she took. Calcium level improved with IV hydration She does have history of primary hyperparathyroidism as well as history of hyperparathyroid ectomy in 2014. PTH is actually low and therefore less likely the etiology here. Vitamin D level unremarkable as well. Other workup pending. Originally suspected to have oral intake as a primary etiology of hypercalcemia with prominence but now appears to have some underlying pathology.  SPEP ordered.  Will discuss with nephrology.   E. coli UTI Urine culture showed E. coli.  Currently on oral cefadroxil. Completed total 7 days,   AKI Baseline creatinine of 0.9. Creatinine of 1.57 on admission. IV fluids started.  Creatinine improved treated with IV fluid.   New onset atrial fibrillation Patient started on heparin IV.  Transthoracic Echocardiogram from 10/13/2022 that was significant for an LVEF of 60-65% with grade 1 diastolic dysfunction and mildly dilated left atrium.  Patient has a history of bradyarrhythmia for which she was sent to cardiology in May 2024. Not a great candidate for long-term anticoagulation but fall risk is now lower due to her debility per son. Switching to Xarelto which is the preferred medication per family.   Elevated troponin Troponin elevated from 54 > 94 > 97. No associated chest pain. Cardiology called per chart review with no concern for ACS. Patient remains asymptomatic. Echocardiogram shows preserved EF.  No significant valvular abnormality.   Primary hypertension Patient is on clonidine, hydralazine as an outpatient which were held on admission.   Diabetes mellitus type 2 Diet controlled. Well controlled. Last hemoglobin A1C of 6.0%.   Subjective: No nausea no vomiting no fever no chills.  Mentation improving compared to yesterday.  Oral intake still minimal.  Physical Exam: General: in Mild distress, No Rash Cardiovascular: S1 and S2 Present, No Murmur Respiratory: Good respiratory effort, Bilateral Air entry present. No Crackles, No wheezes Abdomen: Bowel Sound present, No tenderness Extremities: No edema Neuro: Alert and oriented x3, no new focal deficit no asterixis.  Data Reviewed: I have Reviewed nursing notes, Vitals, and Lab results. Since last encounter, pertinent lab results CBC and BMP   . I have ordered test including BMP  . I have discussed pt's care plan and test results with nephrology  .   Disposition: Status is: Inpatient Remains inpatient appropriate because: Need for treatment of hypercalcemia Rivaroxaban (XARELTO) tablet 15 mg  Family Communication: Son at bedside Level of care: Telemetry   Vitals:   12/28/22 2127 12/29/22 0518  12/29/22 0555 12/29/22 1237  BP: (!) 149/76 (!) 153/67  (!) 117/53  Pulse: (!) 59 60  72  Resp: 16 18  17   Temp: 97.8 F (36.6 C) 99.4 F (37.4 C)  97.9 F (36.6 C)  TempSrc: Oral Oral  Oral  SpO2: 94% 95%  95%  Weight:   58.2 kg   Height:         Author: Lynden Oxford, MD 12/29/2022 6:16 PM  Please look on [email or web address] to find out who is on call.

## 2022-12-29 NOTE — Progress Notes (Addendum)
I have reviewed and concur with this student's documentation.   Tina Nevin, MA, CCC-SLP Speech Therapy  12/29/2022 3:49 PM   Speech Language Pathology Treatment: Dysphagia  Patient Details Name: Tina Frank MRN: [id number] DOB: February 05, 1941 Today's Date: 12/29/2022 Time: 7846-9629 SLP Time Calculation (min) (ACUTE ONLY): 12 min  Assessment / Plan / Recommendation Clinical Impression  Patient seen by SLP for skilled intervention focused on dysphagia. Patient was awake, alert and cooperative throughout the session. RN reported to SLP that she has observed patient coughing when taking medication.  Patient complained of feeling dizzy, intermittently requesting SLP help her throughout the session. SLP repositioned patient in bed and patient verbalized relief. SLP directly observed patient with sips of thin liquid via straw. One instance of delayed coughing. However, cough was also heard independent of PO input throughout the session. No other s/sx of aspiration were present. SLP recommend patient continue on regular, thin liquid with medications whole (cut or crush if she does not tolerate whole)  in puree. ST to follow up at least once more to ensure diet toleration.   HPI HPI: Tina Frank is a 82 y.o. female adm with AMS, refusal to eat/drink nor take medications. Found to have metabolic encephalopathy d/t UTI and AKI with hypercalcemia. 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 CXR on this admit was negative, prior CXR suggested CHF 10/2022. Pt was placed on nectar thick liquids clinically following her CVA in August 2024 and then was advanced to thin liquids prior to discharge from the hospital. She has undergone prior UGI testing 2019- that showed 13 mm barium tablet did not transit through the GEJ despite mutiple sips of water and monitoring  for 4 minutes - but pt was not sensate to pill lodging. Small sliding scale hiatal hernia and nod visible esophagitis noted. No family present for premorbid information. ST swallow eval ordered on 10/15, patient was started on regular, thin liquid diet.      SLP Plan  Continue with current plan of care      Recommendations for follow up therapy are one component of a multi-disciplinary discharge planning process, led by the attending physician.  Recommendations may be updated based on patient status, additional functional criteria and insurance authorization.    Recommendations  Diet recommendations: Regular;Thin liquid Liquids provided via: Cup;Straw Medication Administration: Crushed with puree Supervision: Intermittent supervision to cue for compensatory strategies;Patient able to self feed Compensations: Minimize environmental distractions;Slow rate;Small sips/bites Postural Changes and/or Swallow Maneuvers: Seated upright 90 degrees                  Oral care BID   Intermittent Supervision/Assistance Dysphagia, unspecified (R13.10)     Continue with current plan of care     Tina Frank, Senaida Lange., Speech Therapy Student    12/29/2022, 3:47 PM

## 2022-12-29 NOTE — Consult Note (Signed)
Nephrology Consult   Reason for consult: hypercalcemia   Assessment/Recommendations:   Moderate Hypercalcemia -h/o hyperparathyroidism s/p rt inferior parathyroidectomy. Work up so far: PTH 14 (appropriately suppressed), calcitriol levels low, vit d 25 WNL. TSH WNL -SPEP pending. Will check UPEP and free light chains. Will also check PTHrP -agree with fluids; NS @ 150cc/hr. If her sensorium and/or calcium do not improve then will add on calcitonin and bisphosphonates -Will need outpatient follow up with endocrinology. Hold calcium and vitamin D supplements on discharge  AKI -baseline seems to be normal. Likely related to hypercalcemia. Fluids as above -Avoid nephrotoxic medications including NSAIDs and iodinated intravenous contrast exposure unless the latter is absolutely indicated.  Preferred narcotic agents for pain control are hydromorphone, fentanyl, and methadone. Morphine should not be used. Avoid Baclofen and avoid oral sodium phosphate and magnesium citrate based laxatives / bowel preps. Continue strict Input and Output monitoring. Will monitor the patient closely with you and intervene or adjust therapy as indicated by changes in clinical status/labs   Acute metabolic encephalopathy -presumed to be secondary to UTI and hyperclacemia  E-coli UTI -on cefadroxil per primary  New onset Afib -on xarelto, per primary  HTN -can titrate hydralazine accordingly if needed. Avoid thiazides   Recommendations conveyed to primary service.    [name] [facility] 12/29/2022 12:03 PM   _____________________________________________________________________________________   History of Present Illness: Tina Frank is a/an 82 y.o. female with a past medical history of DM2, hypertension, HFpEF, ischemic CVA, hyperlipidemia who presents to [facility] with worsening confusion, refusing to eat/drink/take meds.  She was diagnosed with a UTI and hypercalcemia.  She did  receive Rocephin initially, transition to cefadroxil.  Her calcium was high as 13.1 on presentation.  She did receive IV fluids, did improve to 10.8 on 10/18.  However her mental status has been waxing waning and her calcium had gone up again, now 12.3 today.  Her creatinine was also elevated at 1.57 on presentation which also improved but now back up to 1.1 today.  Urine output 0.9 L with 1 unmeasured void. Patient seen and examined bedside. She is awake but not answering all questions clearly. Following some commands. When prompted, she denies any chest pain, SOB, N/V/D, numbness/tingling.  Medications:  Current Facility-Administered Medications  Medication Dose Route Frequency Provider Last Rate Last Admin   0.9 %  sodium chloride infusion   Intravenous Continuous Rolly Salter, MD       acetaminophen (TYLENOL) tablet 650 mg  650 mg Oral Q6H PRN Briscoe Deutscher, MD   650 mg at 12/29/22 0935   Or   acetaminophen (TYLENOL) suppository 650 mg  650 mg Rectal Q6H PRN Opyd, Lavone Neri, MD       aspirin EC tablet 81 mg  81 mg Oral Daily Rolly Salter, MD   81 mg at 12/29/22 0934   cefadroxil (DURICEF) capsule 500 mg  500 mg Oral BID Rolly Salter, MD   500 mg at 12/29/22 0935   hydrALAZINE (APRESOLINE) tablet 25 mg  25 mg Oral Q8H Rolly Salter, MD   25 mg at 12/29/22 0629   insulin aspart (novoLOG) injection 0-15 Units  0-15 Units Subcutaneous TID WC Narda Bonds, MD   2 Units at 12/27/22 1334   ondansetron (ZOFRAN) tablet 4 mg  4 mg Oral Q6H PRN Opyd, Lavone Neri, MD       Or   ondansetron (ZOFRAN) injection 4 mg  4 mg Intravenous Q6H PRN  Briscoe Deutscher, MD       Oral care mouth rinse  15 mL Mouth Rinse 4 times per day Opyd, Lavone Neri, MD   15 mL at 12/29/22 0939   Oral care mouth rinse  15 mL Mouth Rinse PRN Opyd, Lavone Neri, MD       pantoprazole (PROTONIX) EC tablet 40 mg  40 mg Oral Daily Opyd, Lavone Neri, MD   40 mg at 12/29/22 0935   potassium chloride SA (KLOR-CON M) CR tablet 20 mEq   20 mEq Oral Daily Rolly Salter, MD   20 mEq at 12/29/22 0935   Rivaroxaban (XARELTO) tablet 15 mg  15 mg Oral Q supper Rolly Salter, MD   15 mg at 12/28/22 1820   sodium chloride flush (NS) 0.9 % injection 3 mL  3 mL Intravenous Q12H Opyd, Lavone Neri, MD   3 mL at 12/29/22 4098     ALLERGIES Abaloparatide, Mobic [meloxicam], Crestor [rosuvastatin], Norvasc [amlodipine besylate], Simvastatin, Sulfa antibiotics, and Zetia [ezetimibe]  MEDICAL HISTORY Past Medical History:  Diagnosis Date   CKD (chronic kidney disease), stage II    nephrologist-- dr Kathrene Bongo  [facility])   Diabetes mellitus type 2, diet-controlled (HCC)    followed by pcp---   (09-20-2019  per pt does not check blood sugar at home)   Fracture of humeral shaft, right, closed 04/25/2022   GERD (gastroesophageal reflux disease)    History of primary hyperparathyroidism    s/p  parathyroidectomy right inferior on 12-02-2012   Humerus fracture 04/25/2022   Hyperlipidemia    Hypertension    followd by nephrologist----  (09-20-2019  per pt had stress test done some time ago , unsure when/ where, thinks told ok)   OA (osteoarthritis)    Osteoporosis    Prosthetic joint infection (HCC)    followed by dr j. hatcher (ID)   left total shoulder arthroplasty 12/ 2016  ; 12/ 2020  revision with explant joint replacement and hemiarthroplasty;  completed 6 month antibiotic 05/ 2021   Vertigo      SOCIAL HISTORY Social History   Socioeconomic History   Marital status: Widowed    Spouse name: Not on file   Number of children: Not on file   Years of education: Not on file   Highest education level: Not on file  Occupational History   Not on file  Tobacco Use   Smoking status: Former    Current packs/day: 0.00    Average packs/day: 1 pack/day for 20.0 years (20.0 ttl pk-yrs)    Types: Cigarettes    Start date: 10/28/1967    Quit date: 10/28/1987    Years since quitting: 35.1   Smokeless tobacco: Never  Vaping  Use   Vaping status: Never Used  Substance and Sexual Activity   Alcohol use: No   Drug use: Never   Sexual activity: Yes    Birth control/protection: Post-menopausal  Other Topics Concern   Not on file  Social History Narrative   Not on file   Social Determinants of Health   Financial Resource Strain: Not on file  Food Insecurity: No Food Insecurity (12/22/2022)   Hunger Vital Sign    Worried About Running Out of Food in the Last Year: Never true    Ran Out of Food in the Last Year: Never true  Transportation Needs: No Transportation Needs (12/22/2022)   PRAPARE - Administrator, Civil Service (Medical): No    Lack of Transportation (  Non-Medical): No  Physical Activity: Not on file  Stress: Not on file  Social Connections: Not on file  Intimate Partner Violence: Not At Risk (12/22/2022)   Humiliation, Afraid, Rape, and Kick questionnaire    Fear of Current or Ex-Partner: No    Emotionally Abused: No    Physically Abused: No    Sexually Abused: No     FAMILY HISTORY Family History  Problem Relation Age of Onset   Heart failure Mother    Cancer Mother    Breast cancer Mother        in her 34s   Cancer Father      Review of Systems: 12 systems reviewed Otherwise as per HPI, all other systems reviewed and negative  Physical Exam: Vitals:   12/28/22 2127 12/29/22 0518  BP: (!) 149/76 (!) 153/67  Pulse: (!) 59 60  Resp: 16 18  Temp: 97.8 F (36.6 C) 99.4 F (37.4 C)  SpO2: 94% 95%   Total I/O In: 240 [P.O.:240] Out: -   Intake/Output Summary (Last 24 hours) at 12/29/2022 1203 Last data filed at 12/29/2022 0900 Gross per 24 hour  Intake 546.18 ml  Output 900 ml  Net -353.82 ml   General: chronically ill-appearing, NAD, frail HEENT: anicteric sclera, oropharynx clear without lesions CV: regular rate, normal rhythm, no murmurs, no gallops, no rubs, no peripheral edema Lungs: clear to auscultation bilaterally, normal work of breathing Abd:  soft, non-tender, non-distended Skin: no visible lesions or rashes Musculoskeletal: no obvious deformities Neuro: awake, not following all commands, moves all ext spontaneously  Test Results Reviewed Lab Results  Component Value Date   NA 133 (L) 12/29/2022   K 3.6 12/29/2022   CL 102 12/29/2022   CO2 24 12/29/2022   BUN 20 12/29/2022   CREATININE 1.13 (H) 12/29/2022   CALCIUM 12.3 (H) 12/29/2022   ALBUMIN 3.0 (L) 12/28/2022   PHOS 2.6 12/24/2022     I have reviewed all relevant outside healthcare records related to the patient's kidney injury.

## 2022-12-29 NOTE — Progress Notes (Signed)
EEG complete - results pending 

## 2022-12-29 NOTE — Progress Notes (Signed)
PT Cancellation Note  Patient Details Name: Daeonna Gant MRN: [id number] DOB: 1941-02-13   Cancelled Treatment:     Pt n/a x 2. Will re-attempt next available date/time per POC.   Jannet Askew 12/29/2022, 4:33 PM

## 2022-12-30 DIAGNOSIS — G9340 Encephalopathy, unspecified: Secondary | ICD-10-CM | POA: Diagnosis not present

## 2022-12-30 LAB — BASIC METABOLIC PANEL
Anion gap: 8 (ref 5–15)
BUN: 23 mg/dL (ref 8–23)
CO2: 22 mmol/L (ref 22–32)
Calcium: 12.2 mg/dL — ABNORMAL HIGH (ref 8.9–10.3)
Chloride: 107 mmol/L (ref 98–111)
Creatinine, Ser: 0.92 mg/dL (ref 0.44–1.00)
GFR, Estimated: >60 mL/min (ref >60)
Glucose, Bld: 98 mg/dL (ref 70–99)
Potassium: 3.7 mmol/L (ref 3.5–5.1)
Sodium: 137 mmol/L (ref 135–145)

## 2022-12-30 LAB — GLUCOSE, CAPILLARY
Glucose-Capillary: 118 mg/dL — ABNORMAL HIGH (ref 70–99)
Glucose-Capillary: 117 mg/dL — ABNORMAL HIGH (ref 70–99)
Glucose-Capillary: 108 mg/dL — ABNORMAL HIGH (ref 70–99)
Glucose-Capillary: 100 mg/dL — ABNORMAL HIGH (ref 70–99)

## 2022-12-30 LAB — CBC
HCT: 31.9 % — ABNORMAL LOW (ref 36.0–46.0)
Hemoglobin: 10.2 g/dL — ABNORMAL LOW (ref 12.0–15.0)
MCH: 30.2 pg (ref 26.0–34.0)
MCHC: 32 g/dL (ref 30.0–36.0)
MCV: 94.4 fL (ref 80.0–100.0)
Platelets: 197 10*3/uL (ref 150–400)
RBC: 3.38 MIL/uL — ABNORMAL LOW (ref 3.87–5.11)
RDW: 14.8 % (ref 11.5–15.5)
WBC: 8.3 10*3/uL (ref 4.0–10.5)
nRBC: 0 % (ref 0.0–0.2)

## 2022-12-30 MED ORDER — SODIUM CHLORIDE 0.9 % IV SOLN: INTRAVENOUS | Status: DC

## 2022-12-30 MED ORDER — HYDRALAZINE HCL 50 MG PO TABS
50.0000 mg | ORAL_TABLET | Freq: Three times a day (TID) | ORAL | Status: DC
  Administered 2022-12-30 – 2023-01-02 (×9): 50 mg via ORAL
  Filled 2022-12-30 (×9): qty 1

## 2022-12-30 NOTE — Progress Notes (Signed)
[facility] Progress Note    Assessment/ Plan:   Moderate Hypercalcemia -h/o hyperparathyroidism s/p rt inferior parathyroidectomy. Work up so far: PTH 14 (appropriately suppressed), calcitriol levels low, vit d 25 WNL. TSH WNL. Home calcium and vitamin D supplements on hold however etiology currently is overall unclear -SPEP, UPEP, FLC, PTHrP pending -Cal down to 12.2 today -fluids: restarting NS @ 150cc/hr. Her mentation seems to be much better for me (she possibly has been sundowning/delirium?) therefore will hold off on calcitonin and bisphosphonates especially when the etiology is unclear. If calcium and mentation worsens then will certainly consider expanding therapy -Will need outpatient follow up with endocrinology. Hold calcium and vitamin D supplements on discharge   AKI -baseline seems to be normal. Likely related to hypercalcemia. Fluids as above. Cr better, down to 0.9. -Avoid nephrotoxic medications including NSAIDs and iodinated intravenous contrast exposure unless the latter is absolutely indicated.  Preferred narcotic agents for pain control are hydromorphone, fentanyl, and methadone. Morphine should not be used. Avoid Baclofen and avoid oral sodium phosphate and magnesium citrate based laxatives / bowel preps. Continue strict Input and Output monitoring. Will monitor the patient closely with you and intervene or adjust therapy as indicated by changes in clinical status/labs    Acute metabolic encephalopathy -presumed to be secondary to UTI and hyperclacemia   E-coli UTI -on cefadroxil per primary   New onset Afib -on xarelto, per primary   HTN -can titrate hydralazine accordingly if needed. Avoid thiazides  Discussed with primary service.  Subjective:   Patient seen and examined bedside. S/p EEG which showed mild to moderate diffuse encephalopathy. Today, patient reports that she feels well. Much more interactive and engaged for me this afternoon.  She is able to confirm her parathyroidectomy, why she was brought to the hospital, etc. She does report that her mouth has been feeling dry. Denies any chest pain, SOB, swelling UOP ~1.7L   Objective:   BP (!) 153/55 (BP Location: Left Arm)   Pulse (!) 57   Temp 98.6 F (37 C) (Oral)   Resp 20   Ht 5\' 5"  (1.651 m)   Wt 57.3 kg   SpO2 100%   BMI 21.02 kg/m   Intake/Output Summary (Last 24 hours) at 12/30/2022 1229 Last data filed at 12/30/2022 0900 Gross per 24 hour  Intake 1062.5 ml  Output 1975 ml  Net -912.5 ml   Weight change: -0.9 kg  Physical Exam: Gen: NAD CVS: RRR Resp: CTA B/L Abd: soft Ext: no edema Neuro: aaox4 (self, place, time, situation), alert, awake, very hard of hearing  Imaging: EEG adult  Result Date: 12/29/2022 [name], MD     12/29/2022  6:38 PM Patient Name: Tina Frank MRN: [id number] Epilepsy Attending: [name] Referring Physician/Provider: Rolly Salter, MD Date: 12/29/2022 Duration: 25.08 mins Patient history: 82yo F with ams getting eeg to evaluate for seizure Level of alertness: Awake AEDs during EEG study: None Technical aspects: This EEG study was done with scalp electrodes positioned according to the 10-20 International system of electrode placement. Electrical activity was reviewed with band pass filter of 1-70Hz , sensitivity of 7 uV/mm, display speed of 28mm/sec with a 60Hz  notched filter applied as appropriate. EEG data were recorded continuously and digitally stored.  Video monitoring was available and reviewed as appropriate. Description: The posterior dominant rhythm consists of 8 Hz activity of moderate voltage (25-35 uV) seen predominantly in posterior head regions, symmetric and reactive to eye opening and eye closing.  EEG showed intermittent generalized 3 to 6 Hz theta-delta slowing. Hyperventilation and photic stimulation were not performed.   ABNORMALITY - Intermittent slow, generalized IMPRESSION: This study is  suggestive of mild to moderate diffuse encephalopathy. No seizures or epileptiform discharges were seen throughout the recording. [name]   MR BRAIN WO CONTRAST  Result Date: 12/29/2022 CLINICAL DATA:  Altered mental status EXAM: MRI HEAD WITHOUT CONTRAST TECHNIQUE: Multiplanar, multiecho pulse sequences of the brain and surrounding structures were obtained without intravenous contrast. COMPARISON:  10/12/2022 FINDINGS: Brain: No restricted diffusion to suggest acute or subacute infarct. No acute hemorrhage, mass, mass effect, or midline shift. No hydrocephalus or extra-axial collection. Pituitary and craniocervical junction within normal limits. Redemonstrated hemosiderin staining in the right occipital lobe. Confluent T2 hyperintense signal in the periventricular white matter and pons, likely the sequela of moderate chronic small vessel ischemic disease. Vascular: Normal arterial flow voids. Skull and upper cervical spine: Normal marrow signal. Sinuses/Orbits: Clear paranasal sinuses. No acute finding in the orbits. Status post bilateral lens replacements. Other: The mastoid air cells are well aerated. IMPRESSION: No acute intracranial process. No evidence of acute or subacute infarct. Electronically Signed   By: Wiliam Ke M.D.   On: 12/29/2022 01:42    Labs: BMET Recent Labs  Lab 12/24/22 0347 12/25/22 0402 12/26/22 0347 12/28/22 1256 12/29/22 0400 12/30/22 0433  NA 135 135 132* 134* 133* 137  K 3.1* 3.2* 3.4* 3.8 3.6 3.7  CL 105 105 102 101 102 107  CO2 23 21* 24 26 24 22   GLUCOSE 93 97 92 114* 117* 98  BUN 29* 20 16 15 20 23   CREATININE 1.11* 1.00 0.90 0.84 1.13* 0.92  CALCIUM 11.1* 10.9* 10.8* 12.5* 12.3* 12.2*  PHOS 2.6  --   --   --   --   --    CBC Recent Labs  Lab 12/26/22 0347 12/28/22 1256 12/29/22 0400 12/30/22 0433  WBC 7.4 9.0 8.6 8.3  NEUTROABS  --  6.3  --   --   HGB 9.5* 11.3* 10.4* 10.2*  HCT 29.9* 34.5* 32.2* 31.9*  MCV 93.4 93.0 92.5 94.4  PLT  191 230 219 197    Medications:     aspirin EC  81 mg Oral Daily   hydrALAZINE  50 mg Oral Q8H   insulin aspart  0-15 Units Subcutaneous TID WC   mouth rinse  15 mL Mouth Rinse 4 times per day   pantoprazole  40 mg Oral Daily   potassium chloride  20 mEq Oral Daily   Rivaroxaban  15 mg Oral Q supper   sodium chloride flush  3 mL Intravenous Q12H      [name], MD [facility] 12/30/2022, 12:29 PM

## 2022-12-30 NOTE — Progress Notes (Signed)
[facility] Progress Note Patient: Natalyia Dreisbach ZOX:[id number] DOB: [birth year] DOA: 12/22/2022  DOS: the patient was seen and examined on 12/30/2022  Hospital Course: Braeden Koep is a 82 y.o. female with a history of diabetes mellitus type 2, hyperparathyroidism s/p parathyroidectomy, primary hypertension, chronic diastolic heart failure, CVA .  Patient presented secondary to worsening confusion and refusal to eat, drink or take medication and found to have acute metabolic encephalopathy secondary to UTI and complicated by associated AKI and hypercalcemia.    Assessment and Plan: Acute metabolic encephalopathy Presumed secondary to UTI, but could also be a component from hypercalcemia.  Mentation improved originally but on 10/20 becomes confused again.  Initiating extensive workup including B12 which is on the low side.  B1 pending.  TSH and free T4 pending.  Calcium level elevated again and therefore initiating IV fluids again.  EEG and MRI brain also ordered as RN reports the patient actually had waxing and waning mentation.  Hypercalcemia History of primary hyperparathyroidism SP parathyroidectomy in 2014 Etiology of the hypercalcemia is not clear. Patient takes a lot of Tums and I do not think that she remembers how much she took. Calcium level improved with IV hydration She does have history of primary hyperparathyroidism as well as history of hyperparathyroid ectomy in 2014. PTH is actually low and therefore less likely the etiology here. Vitamin D level unremarkable as well. Other workup pending. Originally suspected to have oral intake as a primary etiology of hypercalcemia with prominence but now appears to have some underlying pathology.  Appreciate oncology input.  Current plan is for IV hydration and monitor.   E. coli UTI Urine culture showed E. coli.  Currently on oral cefadroxil. Completed total 7 days,   AKI Baseline creatinine of 0.9. Creatinine of 1.57 on  admission. IV fluids started. Creatinine improved treated with IV fluid.   New onset atrial fibrillation Patient started on heparin IV.  Transthoracic Echocardiogram from 10/13/2022 that was significant for an LVEF of 60-65% with grade 1 diastolic dysfunction and mildly dilated left atrium.  Patient has a history of bradyarrhythmia for which she was sent to cardiology in May 2024. Not a great candidate for long-term anticoagulation but fall risk is now lower due to her debility per son. Switching to Xarelto which is the preferred medication per family.   Elevated troponin Troponin elevated from 54 > 94 > 97. No associated chest pain. Cardiology called per chart review with no concern for ACS. Patient remains asymptomatic. Echocardiogram shows preserved EF.  No significant valvular abnormality.   Primary hypertension Patient is on clonidine, hydralazine as an outpatient which were held on admission.   Diabetes mellitus type 2 Diet controlled. Well controlled. Last hemoglobin A1C of 6.0%.   Subjective: Appears more lethargic but later on when nephrology with the patient she was more alert.  No other events.  No nausea no vomiting.  Physical Exam: General: in Mild distress, No Rash Cardiovascular: S1 and S2 Present, No Murmur Respiratory: Good respiratory effort, Bilateral Air entry present. No Crackles, No wheezes Abdomen: Bowel Sound present, No tenderness Extremities: No edema Neuro: Alert and oriented x3, no new focal deficit, no asterixis  Data Reviewed: I have Reviewed nursing notes, Vitals, and Lab results. Since last encounter, pertinent lab results CBC and BMP   . I have ordered test including CBC and BMP  .  Discussed with nephrology.  Disposition: Status is: Inpatient Remains inpatient appropriate because: Need for improvement in calcium level.  Rivaroxaban (XARELTO)  tablet 15 mg   Family Communication: Discussed with son at bedside Level of care: Telemetry  continue Vitals:   12/30/22 0500 12/30/22 1000 12/30/22 1429 12/30/22 1452  BP:  (!) 153/55 (!) 152/63   Pulse:  (!) 57 (!) 50   Resp:  20    Temp:    98.3 F (36.8 C)  TempSrc:    Oral  SpO2:  100% 100%   Weight: 57.3 kg     Height:         Author: Lynden Oxford, MD 12/30/2022 7:51 PM  Please look on [email or web address] to find out who is on call.

## 2022-12-30 NOTE — Progress Notes (Signed)
Physical Therapy Treatment Patient Details Name: Tina Frank MRN: [id number] DOB: 1940-12-01 Today's Date: 12/30/2022   History of Present Illness 82 y.o. female with a history of diabetes mellitus type 2, hyperparathyroidism s/p parathyroidectomy, primary hypertension, chronic diastolic heart failure, internal capsule infarct August 2024, R reverse TSA 04/27/22 .  Patient presented secondary to worsening confusion and refusal to eat, drink or take medication and found to have acute metabolic encephalopathy secondary to UTI and complicated by associated AKI and hypercalcemia    PT Comments  The patiernt reports feeling dizzy when rolling, sitting up and returning to supine.  Patient demonstrates strong posterior bias/pushing when  sitting and standing. Continue  PT for mobility.     If plan is discharge home, recommend the following: A lot of help with bathing/dressing/bathroom;Two people to help with walking and/or transfers;Assistance with cooking/housework;Assist for transportation;Help with stairs or ramp for entrance   Can travel by private vehicle     No  Equipment Recommendations       Recommendations for Other Services       Precautions / Restrictions Precautions Precautions: Fall Precaution Comments: hx of falls (with R shoulder fx in Feb 2024) Restrictions Weight Bearing Restrictions: No     Mobility  Bed Mobility   Bed Mobility: Rolling, Sidelying to Sit, Sit to Sidelying Rolling: Max assist, +2 for physical assistance, +2 for safety/equipment Sidelying to sit: Max assist, +2 for physical assistance, +2 for safety/equipment, HOB elevated     Sit to sidelying: Total assist, +2 for physical assistance, +2 for safety/equipment General bed mobility comments: pt able to assist with advancing BLEs to edge of bed, max A to raise trunk, total to flex hips to sit up, pushing posteriorly, total back to supine, reports dizziness    Transfers Overall transfer level:  Needs assistance Equipment used: Rolling walker (2 wheels) Transfers: Sit to/from Stand Sit to Stand: From elevated surface, Max assist, +2 safety/equipment, +2 physical assistance           General transfer comment: x 4 att RW, feet secured as patient picks up feet when stands, posterior  pushing strongly.    Ambulation/Gait                   Stairs             Wheelchair Mobility     Tilt Bed    Modified Rankin (Stroke Patients Only)       Balance Overall balance assessment: Needs assistance Sitting-balance support: Feet supported, No upper extremity supported Sitting balance-Leahy Scale: Poor   Postural control: Posterior lean Standing balance support: Bilateral upper extremity supported, Reliant on assistive device for balance, During functional activity Standing balance-Leahy Scale: Poor Standing balance comment: pushes back                            Cognition Arousal: Alert Behavior During Therapy: Anxious Overall Cognitive Status: No family/caregiver present to determine baseline cognitive functioning                                 General Comments: fearful of falling, not oriented to date of oct.        Exercises      General Comments        Pertinent Vitals/Pain Pain Assessment Faces Pain Scale: Hurts even more Pain Location: sitting on Pure wick Pain Descriptors /  Indicators: Moaning, Grimacing Pain Intervention(s): Monitored during session, Repositioned    Home Living                          Prior Function            PT Goals (current goals can now be found in the care plan section) Progress towards PT goals: Progressing toward goals    Frequency    Min 1X/week      PT Plan      Co-evaluation              AM-PAC PT "6 Clicks" Mobility   Outcome Measure  Help needed turning from your back to your side while in a flat bed without using bedrails?: A Lot Help needed  moving from lying on your back to sitting on the side of a flat bed without using bedrails?: A Lot Help needed moving to and from a bed to a chair (including a wheelchair)?: Total Help needed standing up from a chair using your arms (e.g., wheelchair or bedside chair)?: Total Help needed to walk in hospital room?: Total Help needed climbing 3-5 steps with a railing? : Total 6 Click Score: 8    End of Session Equipment Utilized During Treatment: Gait belt Activity Tolerance: Patient limited by fatigue;Patient tolerated treatment well Patient left: in bed;with call bell/phone within reach;with bed alarm set Nurse Communication: Mobility status;Need for lift equipment PT Visit Diagnosis: History of falling (Z91.81);Difficulty in walking, not elsewhere classified (R26.2)     Time: 3295-1884 PT Time Calculation (min) (ACUTE ONLY): 16 min  Charges:    $Therapeutic Activity: 8-22 mins PT General Charges $$ ACUTE PT VISIT: 1 Visit                     Blanchard Kelch PT Acute Rehabilitation Services Office [phone] Weekend pager-[phone]    Rada Hay 12/30/2022, 4:31 PM

## 2022-12-31 DIAGNOSIS — E876 Hypokalemia: Secondary | ICD-10-CM

## 2022-12-31 DIAGNOSIS — G9340 Encephalopathy, unspecified: Secondary | ICD-10-CM | POA: Diagnosis not present

## 2022-12-31 DIAGNOSIS — N179 Acute kidney failure, unspecified: Secondary | ICD-10-CM | POA: Diagnosis not present

## 2022-12-31 DIAGNOSIS — N3000 Acute cystitis without hematuria: Secondary | ICD-10-CM | POA: Diagnosis not present

## 2022-12-31 DIAGNOSIS — I4891 Unspecified atrial fibrillation: Secondary | ICD-10-CM | POA: Diagnosis not present

## 2022-12-31 LAB — KAPPA/LAMBDA LIGHT CHAINS
Kappa free light chain: 56.4 mg/L — ABNORMAL HIGH (ref 3.3–19.4)
Kappa, lambda light chain ratio: 2.64 — ABNORMAL HIGH (ref 0.26–1.65)
Lambda free light chains: 21.4 mg/L (ref 5.7–26.3)

## 2022-12-31 LAB — BASIC METABOLIC PANEL
Anion gap: 5 (ref 5–15)
BUN: 17 mg/dL (ref 8–23)
CO2: 21 mmol/L — ABNORMAL LOW (ref 22–32)
Calcium: 11.7 mg/dL — ABNORMAL HIGH (ref 8.9–10.3)
Chloride: 110 mmol/L (ref 98–111)
Creatinine, Ser: 0.9 mg/dL (ref 0.44–1.00)
GFR, Estimated: >60 mL/min (ref >60)
Glucose, Bld: 105 mg/dL — ABNORMAL HIGH (ref 70–99)
Potassium: 3.1 mmol/L — ABNORMAL LOW (ref 3.5–5.1)
Sodium: 136 mmol/L (ref 135–145)

## 2022-12-31 LAB — CBC
HCT: 31.7 % — ABNORMAL LOW (ref 36.0–46.0)
Hemoglobin: 10.2 g/dL — ABNORMAL LOW (ref 12.0–15.0)
MCH: 30.3 pg (ref 26.0–34.0)
MCHC: 32.2 g/dL (ref 30.0–36.0)
MCV: 94.1 fL (ref 80.0–100.0)
Platelets: 230 10*3/uL (ref 150–400)
RBC: 3.37 MIL/uL — ABNORMAL LOW (ref 3.87–5.11)
RDW: 14.8 % (ref 11.5–15.5)
WBC: 9.8 10*3/uL (ref 4.0–10.5)
nRBC: 0 % (ref 0.0–0.2)

## 2022-12-31 LAB — MAGNESIUM: Magnesium: 1.6 mg/dL — ABNORMAL LOW (ref 1.7–2.4)

## 2022-12-31 LAB — GLUCOSE, CAPILLARY
Glucose-Capillary: 110 mg/dL — ABNORMAL HIGH (ref 70–99)
Glucose-Capillary: 148 mg/dL — ABNORMAL HIGH (ref 70–99)
Glucose-Capillary: 114 mg/dL — ABNORMAL HIGH (ref 70–99)
Glucose-Capillary: 134 mg/dL — ABNORMAL HIGH (ref 70–99)

## 2022-12-31 MED ORDER — MAGNESIUM SULFATE 4 GM/100ML IV SOLN
4.0000 g | Freq: Once | INTRAVENOUS | Status: AC
  Administered 2022-12-31: 4 g via INTRAVENOUS
  Filled 2022-12-31: qty 100

## 2022-12-31 MED ORDER — SODIUM CHLORIDE 0.9 % IV SOLN: INTRAVENOUS | Status: DC

## 2022-12-31 MED ORDER — POTASSIUM CHLORIDE CRYS ER 10 MEQ PO TBCR
40.0000 meq | EXTENDED_RELEASE_TABLET | Freq: Once | ORAL | Status: AC
  Administered 2022-12-31: 40 meq via ORAL
  Filled 2022-12-31: qty 4

## 2022-12-31 MED ORDER — ENSURE ENLIVE PO LIQD
237.0000 mL | Freq: Two times a day (BID) | ORAL | Status: DC
  Administered 2022-12-31 – 2023-01-08 (×13): 237 mL via ORAL

## 2022-12-31 NOTE — TOC Progression Note (Signed)
Transition of Care [facility]) - Progression Note    Patient Details  Name: Quinya Zaunbrecher MRN: [id number] Date of Birth: 1941-03-05  [facility] of [facility]) CM/SW Contact  Larrie Kass, LCSW Phone Number: 12/31/2022, 2:47 PM  Clinical Narrative:    Pt's insurance [employer] was approved with ending date 10/22, will submitted new insurance auth when pt is close to medical stability. TOC to follow.   Expected Discharge Plan: Skilled Nursing Facility Barriers to Discharge: Insurance Authorization  Expected Discharge Plan and Services       Living arrangements for the past 2 months: Skilled Nursing Facility Expected Discharge Date: 12/26/22                                     Social Determinants of Health (SDOH) Interventions SDOH Screenings   Food Insecurity: No Food Insecurity (12/22/2022)  Housing: Low Risk  (12/22/2022)  Transportation Needs: No Transportation Needs (12/22/2022)  Utilities: Not At Risk (12/22/2022)  Depression (PHQ2-9): Low Risk  (07/19/2019)  Tobacco Use: Medium Risk (12/22/2022)    Readmission Risk Interventions    04/28/2022    3:15 PM  Readmission Risk Prevention Plan  Transportation Screening Complete  PCP or Specialist Appt within 5-7 Days Complete  Home Care Screening Complete  Medication Review (RN CM) Complete

## 2022-12-31 NOTE — Progress Notes (Addendum)
PROGRESS NOTE    Pat Tina Frank  BMW:[id number] DOB: June 11, 1940 DOA: 12/22/2022 PCP: Sigmund Hazel, MD    Chief Complaint  Patient presents with   Altered Mental Status    Brief Narrative:  Tina Frank is a 82 y.o. female with a history of diabetes mellitus type 2, hyperparathyroidism s/p parathyroidectomy, primary hypertension, chronic diastolic heart failure, CVA . Patient presented secondary to worsening confusion and refusal to eat, drink or take medication and found to have acute metabolic encephalopathy secondary to UTI and complicated by associated AKI and hypercalcemia.    Assessment & Plan:   Principal Problem:   Acute encephalopathy Active Problems:   Hypercalcemia   UTI (urinary tract infection)   AKI (acute kidney injury) (HCC)   Atrial fibrillation (HCC)   Elevated troponin   Chronic heart failure with preserved ejection fraction (HFpEF) (HCC)  #1 acute metabolic encephalopathy -Felt presumed secondary to UTI in the setting of hypercalcemia. -Patient noted to have some improvement with mental status however some concerns for waxing and waning as he became quite confused again on 12/28/2022.??  Sundowning. -TSH 1.499, free T41.46, vitamin B12 of 362, vitamin B1 pending.  Ammonia level < 10. -MRI brain with no acute intracranial process, no evidence of acute or subacute infarct noted. -EEG done with mild to moderate diffuse encephalopathy, no seizures or epileptiform discharges were seen throughout the recording. -Continue supportive care with IV fluids.  2.  Hypercalcemia/history of primary hyperparathyroidism status post parathyroidectomy in 2014 -??  Etiology of hypercalcemia. -May be secondary to exogenous calcium from home calcium and vitamin D supplementation. -PTH at 14 appropriately suppressed, calcitriol levels noted low, vitamin D 25 hydroxy within normal limits.  TSH within normal limits. -PTH RP pending, SPEP UPEP pending. -Patient seen in  consultation by nephrology, placed on IV fluids with calcium level slowly trending down. -Nephrology recommending holding off on calcitonin and oral bisphosphonates at this time. -Will need outpatient follow-up with endocrinology. -Nephrology following and appreciate input and recommendations.  3.  AKI -Felt likely secondary to a prerenal azotemia in the setting of dehydration and hypercalcemia. -Renal function improved with hydration and currently resolved.  4.  Hypokalemia/hypomagnesemia -Magnesium at 1.6, potassium at 3.1. -Magnesium sulfate 4 g IV x 1. -K-Dur 40 mEq p.o. x 1. -Repeat labs in AM.  5.  E. coli UTI -Was on IV Rocephin, transition to [facility] and completed 7-day course of treatment.  6.  New onset A-fib -2D echo from 10/13/2022 with a EF of 60 to 65%, grade 1 DD and mildly dilated left atrium. -Patient with history of bradycardia arrhythmia for which she was sent to cardiology in May 2024. -Patient felt not a great candidate for long-term anticoagulation but fall risk now is lower due to debility per son. -Patient placed on Xarelto which is preferred medication per family.  7.  Elevated troponin -Currently asymptomatic. -Prior physician spoke with cardiology who reviewed the chart and felt no concerns for ACS.  Patient currently asymptomatic. -2D echo with normal EF, no significant valvular abnormality.  8.  Hypertension -Patient noted to have been on clonidine, hydralazine in the outpatient setting which were held on admission. -Hydralazine resumed and will uptitrate as needed.  9.  Diet controlled diabetes mellitus type 2 -Hemoglobin A1c 6.0 (10/13/2022) -CBG 110 this morning. -SSI.       DVT prophylaxis: Xarelto Code Status: DNR Family Communication: updated son on telephone. Disposition: SNF when medically stable.  Status is: Inpatient Remains inpatient appropriate because: Severity of  illness   Consultants:  Nephrology: Dr. Thedore Mins  12/29/2022  Procedures:  Chest x-ray 12/22/2022 MRI brain 12/28/2022 2D echo 12/24/2022 EEG 12/29/2022  Antimicrobials:  Anti-infectives (From admission, onward)    Start     Dose/Rate Route Frequency Ordered Stop   12/26/22 0000  cefadroxil (DURICEF) 500 MG capsule        500 mg Oral 2 times daily 12/26/22 0938 12/30/22 2359   12/25/22 1000  cefadroxil (DURICEF) capsule 500 mg        500 mg Oral 2 times daily 12/25/22 0845 12/29/22 2240   12/23/22 1800  cefTRIAXone (ROCEPHIN) 2 g in sodium chloride 0.9 % 100 mL IVPB  Status:  Discontinued        2 g 200 mL/hr over 30 Minutes Intravenous Every 24 hours 12/22/22 2127 12/25/22 0845   12/22/22 1800  cefTRIAXone (ROCEPHIN) 2 g in sodium chloride 0.9 % 100 mL IVPB        2 g 200 mL/hr over 30 Minutes Intravenous  Once 12/22/22 1756 12/22/22 1950         Subjective: Patient laying in bed eating lunch.  Denies any chest pain or shortness of breath.  No significant abdominal pain.  Alert and oriented to self place and time.  Thinks is November.  Knows who the president is.  Knows why she is hospitalized.  Objective: Vitals:   12/30/22 2052 12/31/22 0414 12/31/22 0500 12/31/22 1126  BP: (!) 158/67 (!) 162/66  (!) 169/77  Pulse: (!) 58 76  73  Resp: 20 (!) 21  18  Temp: 98.5 F (36.9 C) 98.4 F (36.9 C)  98 F (36.7 C)  TempSrc: Oral Oral  Oral  SpO2: 98% 96%  98%  Weight:   55.8 kg   Height:        Intake/Output Summary (Last 24 hours) at 12/31/2022 1242 Last data filed at 12/31/2022 0900 Gross per 24 hour  Intake 2613.51 ml  Output 1100 ml  Net 1513.51 ml   Filed Weights   12/29/22 0555 12/30/22 0500 12/31/22 0500  Weight: 58.2 kg 57.3 kg 55.8 kg    Examination:  General exam: Appears calm and comfortable  Respiratory system: Clear to auscultation.  No wheezes, no crackles, no rhonchi.  Fair air movement.  Speaking in full sentences.  Respiratory effort normal. Cardiovascular system: S1 & S2 heard, RRR. No JVD,  murmurs, rubs, gallops or clicks. No pedal edema. Gastrointestinal system: Abdomen is nondistended, soft and nontender. No organomegaly or masses felt. Normal bowel sounds heard. Central nervous system: Alert and oriented.  Moving extremities spontaneously.  No focal neurological deficits. Extremities: Symmetric 5 x 5 power. Skin: No rashes, lesions or ulcers Psychiatry: Judgement and insight appear normal. Mood & affect appropriate.     Data Reviewed: I have personally reviewed following labs and imaging studies  CBC: Recent Labs  Lab 12/26/22 0347 12/28/22 1256 12/29/22 0400 12/30/22 0433 12/31/22 0409  WBC 7.4 9.0 8.6 8.3 9.8  NEUTROABS  --  6.3  --   --   --   HGB 9.5* 11.3* 10.4* 10.2* 10.2*  HCT 29.9* 34.5* 32.2* 31.9* 31.7*  MCV 93.4 93.0 92.5 94.4 94.1  PLT 191 230 219 197 230    Basic Metabolic Panel: Recent Labs  Lab 12/25/22 0402 12/26/22 0347 12/28/22 1256 12/29/22 0400 12/30/22 0433 12/31/22 0407 12/31/22 0409  NA 135 132* 134* 133* 137  --  136  K 3.2* 3.4* 3.8 3.6 3.7  --  3.1*  CL  105 102 101 102 107  --  110  CO2 21* 24 26 24 22   --  21*  GLUCOSE 97 92 114* 117* 98  --  105*  BUN 20 16 15 20 23   --  17  CREATININE 1.00 0.90 0.84 1.13* 0.92  --  0.90  CALCIUM 10.9* 10.8* 12.5* 12.3* 12.2*  --  11.7*  MG 1.7 1.8  --   --   --  1.6*  --     GFR: Estimated Creatinine Clearance: 41 mL/min (by C-G formula based on SCr of 0.9 mg/dL).  Liver Function Tests: Recent Labs  Lab 12/28/22 1256  AST 17  ALT 14  ALKPHOS 85  BILITOT 0.8  PROT 6.5  ALBUMIN 3.0*    CBG: Recent Labs  Lab 12/30/22 1118 12/30/22 1642 12/30/22 2045 12/31/22 0756 12/31/22 1111  GLUCAP 117* 108* 100* 110* 148*     Recent Results (from the past 240 hour(s))  Urine Culture     Status: Abnormal   Collection Time: 12/22/22  4:56 PM   Specimen: Urine, Clean Catch  Result Value Ref Range Status   Specimen Description   Final    URINE, CLEAN CATCH Performed at [facility], [address] [place], [place] [address]    Special Requests   Final    NONE Performed at [facility], [address], [place], [place] [address]    Culture >=100,000 COLONIES/mL ESCHERICHIA COLI (A)  Final   Report Status 12/25/2022 FINAL  Final   Organism ID, Bacteria ESCHERICHIA COLI (A)  Final      Susceptibility   Escherichia coli - MIC*    AMPICILLIN >=32 RESISTANT Resistant     CEFAZOLIN <=4 SENSITIVE Sensitive     CEFEPIME <=0.12 SENSITIVE Sensitive     CEFTRIAXONE <=0.25 SENSITIVE Sensitive     CIPROFLOXACIN >=4 RESISTANT Resistant     GENTAMICIN >=16 RESISTANT Resistant     IMIPENEM <=0.25 SENSITIVE Sensitive     NITROFURANTOIN <=16 SENSITIVE Sensitive     TRIMETH/SULFA <=20 SENSITIVE Sensitive     AMPICILLIN/SULBACTAM 16 INTERMEDIATE Intermediate     PIP/TAZO <=4 SENSITIVE Sensitive ug/mL    * >=100,000 COLONIES/mL ESCHERICHIA COLI  Blood culture (routine x 2)     Status: None   Collection Time: 12/22/22  6:34 PM   Specimen: BLOOD RIGHT HAND  Result Value Ref Range Status   Specimen Description   Final    BLOOD RIGHT HAND Performed at [facility], [address], [place], [place] [address]    Special Requests   Final    BOTTLES DRAWN AEROBIC AND ANAEROBIC Blood Culture results may not be optimal due to an inadequate volume of blood received in culture bottles Performed at [facility], [address] [place], [place] [address]    Culture   Final    NO GROWTH 5 DAYS Performed at [facility], [address] [place], [place] [address]    Report Status 12/27/2022 FINAL  Final         Radiology Studies: EEG adult  Result Date: 2023/01/19 [name], MD     January 19, 2023  6:38 PM Patient Name: Kourtny Guzzetta MRN: [id number] Epilepsy Attending: [name] Referring Physician/Provider: Rolly Salter, MD Date: 01/19/23 Duration: 25.08 mins Patient history: 82yo F with  ams getting eeg to evaluate for seizure Level of alertness: Awake AEDs during EEG study: None Technical aspects: This EEG study  was done with scalp electrodes positioned according to the 10-20 International system of electrode placement. Electrical activity was reviewed with band pass filter of 1-70Hz , sensitivity of 7 uV/mm, display speed of 78mm/sec with a 60Hz  notched filter applied as appropriate. EEG data were recorded continuously and digitally stored.  Video monitoring was available and reviewed as appropriate. Description: The posterior dominant rhythm consists of 8 Hz activity of moderate voltage (25-35 uV) seen predominantly in posterior head regions, symmetric and reactive to eye opening and eye closing. EEG showed intermittent generalized 3 to 6 Hz theta-delta slowing. Hyperventilation and photic stimulation were not performed.   ABNORMALITY - Intermittent slow, generalized IMPRESSION: This study is suggestive of mild to moderate diffuse encephalopathy. No seizures or epileptiform discharges were seen throughout the recording. [name]        Scheduled Meds:  aspirin EC  81 mg Oral Daily   feeding supplement  237 mL Oral BID BM   hydrALAZINE  50 mg Oral Q8H   insulin aspart  0-15 Units Subcutaneous TID WC   mouth rinse  15 mL Mouth Rinse 4 times per day   pantoprazole  40 mg Oral Daily   potassium chloride  20 mEq Oral Daily   Rivaroxaban  15 mg Oral Q supper   sodium chloride flush  3 mL Intravenous Q12H   Continuous Infusions:  sodium chloride 150 mL/hr at 12/31/22 0913   magnesium sulfate bolus IVPB 4 g (12/31/22 1102)     LOS: 9 days    Time spent: 35 minutes    Ramiro Harvest, MD [facility]   To contact the attending provider between 7A-7P or the covering provider during after hours 7P-7A, please log into the web site [email or web address] and access using universal [place] password for that web site. If you do not have the password, please call the  hospital operator.  12/31/2022, 12:42 PM

## 2022-12-31 NOTE — Plan of Care (Signed)
  Problem: Activity: Goal: Ability to tolerate increased activity will improve Outcome: Progressing   Problem: Pain Management: Goal: Pain level will decrease with appropriate interventions Outcome: Progressing   Problem: Clinical Measurements: Goal: Will remain free from infection Outcome: Progressing   Problem: Skin Integrity: Goal: Risk for impaired skin integrity will decrease Outcome: Progressing

## 2022-12-31 NOTE — Progress Notes (Addendum)
[facility] Progress Note    Assessment/ Plan:   Moderate Hypercalcemia, improving -h/o hyperparathyroidism s/p rt inferior parathyroidectomy 11/2012. Work up so far: PTH 14 (appropriately suppressed), calcitriol levels low, vit d 25 WNL. TSH WNL. Etiology presumed to be secondary to high cal and vit d intake. Home calcium and vitamin D supplements on hold -SPEP, UPEP, FLC, PTHrP pending -Cal down to 11.7 today -c/w fluids: NS @125cc /hr. Mentation stable for me on exam (apparently mentation waxing/waning, possibly related to delirium/sundowning?). Will hold off on calcitonin and/or bisphosphonates. If calcium and mentation worsens then will certainly consider expanding therapy -Will need outpatient follow up with endocrinology. Hold calcium and vitamin D supplements on discharge   AKI -baseline seems to be normal. Likely related to hypercalcemia. Fluids as above. AKI resolved -Avoid nephrotoxic medications including NSAIDs and iodinated intravenous contrast exposure unless the latter is absolutely indicated.  Preferred narcotic agents for pain control are hydromorphone, fentanyl, and methadone. Morphine should not be used. Avoid Baclofen and avoid oral sodium phosphate and magnesium citrate based laxatives / bowel preps. Continue strict Input and Output monitoring. Will monitor the patient closely with you and intervene or adjust therapy as indicated by changes in clinical status/labs    Acute metabolic encephalopathy -presumed to be secondary to UTI and hyperclacemia   E-coli UTI -s/p cefadroxil- per primary   New onset Afib -on xarelto, per primary   HTN -can titrate hydralazine accordingly if needed. Avoid thiazides  Hypokalemia -replete prn  [name], MD  [facility]  Subjective:   Patient seen and examined bedside. Uop charted ~1.3L Patient denies any chest pain, SOB, swelling. No complaints/acute events.   Objective:   BP (!) 169/77 (BP  Location: Left Arm)   Pulse 73   Temp 98 F (36.7 C) (Oral)   Resp 18   Ht 5\' 5"  (1.651 m)   Wt 55.8 kg   SpO2 98%   BMI 20.47 kg/m   Intake/Output Summary (Last 24 hours) at 12/31/2022 1254 Last data filed at 12/31/2022 0900 Gross per 24 hour  Intake 2613.51 ml  Output 1100 ml  Net 1513.51 ml   Weight change: -1.508 kg  Physical Exam: Gen: NAD CVS: RRR Resp: CTA B/L Abd: soft Ext: no edema Neuro: alert, awake, very hard of hearing  Imaging: EEG adult  Result Date: 12/29/2022 [name], MD     12/29/2022  6:38 PM Patient Name: Tina Frank MRN: [id number] Epilepsy Attending: [name] Referring Physician/Provider: Rolly Salter, MD Date: 12/29/2022 Duration: 25.08 mins Patient history: 82yo F with ams getting eeg to evaluate for seizure Level of alertness: Awake AEDs during EEG study: None Technical aspects: This EEG study was done with scalp electrodes positioned according to the 10-20 International system of electrode placement. Electrical activity was reviewed with band pass filter of 1-70Hz , sensitivity of 7 uV/mm, display speed of 34mm/sec with a 60Hz  notched filter applied as appropriate. EEG data were recorded continuously and digitally stored.  Video monitoring was available and reviewed as appropriate. Description: The posterior dominant rhythm consists of 8 Hz activity of moderate voltage (25-35 uV) seen predominantly in posterior head regions, symmetric and reactive to eye opening and eye closing. EEG showed intermittent generalized 3 to 6 Hz theta-delta slowing. Hyperventilation and photic stimulation were not performed.   ABNORMALITY - Intermittent slow, generalized IMPRESSION: This study is suggestive of mild to moderate diffuse encephalopathy. No seizures or epileptiform discharges were seen throughout the recording. [name]  Labs: BMET Recent Labs  Lab 12/25/22 0402 12/26/22 0347 12/28/22 1256 12/29/22 0400 12/30/22 0433  12/31/22 0409  NA 135 132* 134* 133* 137 136  K 3.2* 3.4* 3.8 3.6 3.7 3.1*  CL 105 102 101 102 107 110  CO2 21* 24 26 24 22  21*  GLUCOSE 97 92 114* 117* 98 105*  BUN 20 16 15 20 23 17   CREATININE 1.00 0.90 0.84 1.13* 0.92 0.90  CALCIUM 10.9* 10.8* 12.5* 12.3* 12.2* 11.7*   CBC Recent Labs  Lab 12/28/22 1256 12/29/22 0400 12/30/22 0433 12/31/22 0409  WBC 9.0 8.6 8.3 9.8  NEUTROABS 6.3  --   --   --   HGB 11.3* 10.4* 10.2* 10.2*  HCT 34.5* 32.2* 31.9* 31.7*  MCV 93.0 92.5 94.4 94.1  PLT 230 219 197 230    Medications:     aspirin EC  81 mg Oral Daily   feeding supplement  237 mL Oral BID BM   hydrALAZINE  50 mg Oral Q8H   insulin aspart  0-15 Units Subcutaneous TID WC   mouth rinse  15 mL Mouth Rinse 4 times per day   pantoprazole  40 mg Oral Daily   potassium chloride  20 mEq Oral Daily   Rivaroxaban  15 mg Oral Q supper   sodium chloride flush  3 mL Intravenous Q12H      [name], MD [facility] 12/31/2022, 12:54 PM

## 2023-01-01 DIAGNOSIS — N179 Acute kidney failure, unspecified: Secondary | ICD-10-CM | POA: Diagnosis not present

## 2023-01-01 DIAGNOSIS — N3000 Acute cystitis without hematuria: Secondary | ICD-10-CM | POA: Diagnosis not present

## 2023-01-01 DIAGNOSIS — G9340 Encephalopathy, unspecified: Secondary | ICD-10-CM | POA: Diagnosis not present

## 2023-01-01 DIAGNOSIS — I4891 Unspecified atrial fibrillation: Secondary | ICD-10-CM | POA: Diagnosis not present

## 2023-01-01 LAB — GLUCOSE, CAPILLARY
Glucose-Capillary: 99 mg/dL (ref 70–99)
Glucose-Capillary: 98 mg/dL (ref 70–99)
Glucose-Capillary: 111 mg/dL — ABNORMAL HIGH (ref 70–99)
Glucose-Capillary: 111 mg/dL — ABNORMAL HIGH (ref 70–99)

## 2023-01-01 LAB — CBC
HCT: 28.9 % — ABNORMAL LOW (ref 36.0–46.0)
Hemoglobin: 9.3 g/dL — ABNORMAL LOW (ref 12.0–15.0)
MCH: 30.4 pg (ref 26.0–34.0)
MCHC: 32.2 g/dL (ref 30.0–36.0)
MCV: 94.4 fL (ref 80.0–100.0)
Platelets: 209 10*3/uL (ref 150–400)
RBC: 3.06 MIL/uL — ABNORMAL LOW (ref 3.87–5.11)
RDW: 15 % (ref 11.5–15.5)
WBC: 8.4 10*3/uL (ref 4.0–10.5)
nRBC: 0 % (ref 0.0–0.2)

## 2023-01-01 LAB — VITAMIN B1: Vitamin B1 (Thiamine): 100.6 nmol/L (ref 66.5–200.0)

## 2023-01-01 LAB — RENAL FUNCTION PANEL
Albumin: 2.4 g/dL — ABNORMAL LOW (ref 3.5–5.0)
Anion gap: 6 (ref 5–15)
BUN: 15 mg/dL (ref 8–23)
CO2: 21 mmol/L — ABNORMAL LOW (ref 22–32)
Calcium: 11.2 mg/dL — ABNORMAL HIGH (ref 8.9–10.3)
Chloride: 109 mmol/L (ref 98–111)
Creatinine, Ser: 0.67 mg/dL (ref 0.44–1.00)
GFR, Estimated: >60 mL/min (ref >60)
Glucose, Bld: 102 mg/dL — ABNORMAL HIGH (ref 70–99)
Phosphorus: 2.7 mg/dL (ref 2.5–4.6)
Potassium: 3.1 mmol/L — ABNORMAL LOW (ref 3.5–5.1)
Sodium: 136 mmol/L (ref 135–145)

## 2023-01-01 LAB — MAGNESIUM: Magnesium: 2.1 mg/dL (ref 1.7–2.4)

## 2023-01-01 MED ORDER — MECLIZINE HCL 25 MG PO TABS
12.5000 mg | ORAL_TABLET | Freq: Three times a day (TID) | ORAL | Status: DC | PRN
  Administered 2023-01-03: 12.5 mg via ORAL
  Filled 2023-01-01 (×2): qty 1

## 2023-01-01 MED ORDER — POTASSIUM CHLORIDE 20 MEQ PO PACK
40.0000 meq | PACK | ORAL | Status: AC
  Administered 2023-01-01 (×2): 40 meq via ORAL
  Filled 2023-01-01 (×2): qty 2

## 2023-01-01 MED ORDER — ORAL CARE MOUTH RINSE: 15.0000 mL | OROMUCOSAL | Status: DC | PRN

## 2023-01-01 MED ORDER — SODIUM CHLORIDE 0.9 % IV SOLN: INTRAVENOUS | Status: AC

## 2023-01-01 NOTE — Progress Notes (Signed)
[facility] Progress Note    Assessment/ Plan:   Moderate Hypercalcemia, improving -h/o hyperparathyroidism s/p rt inferior parathyroidectomy 11/2012. Work up so far: PTH 14 (appropriately suppressed), calcitriol levels low, vit d 25 WNL. TSH WNL. Etiology presumed to be secondary to high cal and vit d intake. Home calcium and vitamin D supplements on hold -abnormal SPEP and FLC ratio -UPEP, PTHrP pending -Cal down to 11.2 today -c/w fluids: NS @125cc /hr. Will hold off on calcitonin and/or bisphosphonates. If calcium and mentation worsens then will certainly consider expanding therapy -Will need outpatient follow up with endocrinology. Hold calcium and vitamin D supplements on discharge  Abnormal SPEP -m-spike up to 0.4 (0.2 in August), FLC ratio 2.6, kappa dominant. Recommend involving oncology -upep pending   AKI -baseline seems to be normal. Likely related to hypercalcemia. Fluids as above. AKI resolved -Avoid nephrotoxic medications including NSAIDs and iodinated intravenous contrast exposure unless the latter is absolutely indicated.  Preferred narcotic agents for pain control are hydromorphone, fentanyl, and methadone. Morphine should not be used. Avoid Baclofen and avoid oral sodium phosphate and magnesium citrate based laxatives / bowel preps. Continue strict Input and Output monitoring. Will monitor the patient closely with you and intervene or adjust therapy as indicated by changes in clinical status/labs    Acute metabolic encephalopathy -presumed to be secondary to UTI and hyperclacemia. Possible component of sundowning/delirium?   E-coli UTI -s/p cefadroxil- per primary   New onset Afib -on xarelto, per primary   HTN -can titrate hydralazine accordingly if needed. Avoid thiazides  Hypokalemia -replete prn  [name], MD [facility]  Subjective:   Patient seen and examined bedside. Uop charted ~1.9L Patient denies any complaints.  Denies any chest pain, SOB, swelling.    Objective:   BP (!) 164/63 (BP Location: Left Arm)   Pulse 64   Temp 98.6 F (37 C) (Oral)   Resp 16   Ht 5\' 5"  (1.651 m)   Wt 55.7 kg   SpO2 99%   BMI 20.43 kg/m   Intake/Output Summary (Last 24 hours) at 01/01/2023 1257 Last data filed at 01/01/2023 0500 Gross per 24 hour  Intake 2325.34 ml  Output 1900 ml  Net 425.34 ml   Weight change: -0.092 kg  Physical Exam: Gen: NAD CVS: RRR Resp: CTA B/L Abd: soft Ext: no edema Neuro: alert, awake, very hard of hearing  Imaging: No results found.  Labs: BMET Recent Labs  Lab 12/26/22 0347 12/28/22 1256 12/29/22 0400 12/30/22 0433 12/31/22 0409 01/01/23 0416  NA 132* 134* 133* 137 136 136  K 3.4* 3.8 3.6 3.7 3.1* 3.1*  CL 102 101 102 107 110 109  CO2 24 26 24 22  21* 21*  GLUCOSE 92 114* 117* 98 105* 102*  BUN 16 15 20 23 17 15   CREATININE 0.90 0.84 1.13* 0.92 0.90 0.67  CALCIUM 10.8* 12.5* 12.3* 12.2* 11.7* 11.2*  PHOS  --   --   --   --   --  2.7   CBC Recent Labs  Lab 12/28/22 1256 12/29/22 0400 12/30/22 0433 12/31/22 0409 01/01/23 0416  WBC 9.0 8.6 8.3 9.8 8.4  NEUTROABS 6.3  --   --   --   --   HGB 11.3* 10.4* 10.2* 10.2* 9.3*  HCT 34.5* 32.2* 31.9* 31.7* 28.9*  MCV 93.0 92.5 94.4 94.1 94.4  PLT 230 219 197 230 209    Medications:     aspirin EC  81 mg Oral Daily   feeding  supplement  237 mL Oral BID BM   hydrALAZINE  50 mg Oral Q8H   insulin aspart  0-15 Units Subcutaneous TID WC   mouth rinse  15 mL Mouth Rinse 4 times per day   pantoprazole  40 mg Oral Daily   potassium chloride  40 mEq Oral Q4H   Rivaroxaban  15 mg Oral Q supper   sodium chloride flush  3 mL Intravenous Q12H      [name], MD [facility] 01/01/2023, 12:57 PM

## 2023-01-01 NOTE — Plan of Care (Signed)
  Problem: Education: Goal: Knowledge of the prescribed therapeutic regimen will improve Outcome: Progressing Goal: Understanding of activity limitations/precautions following surgery will improve Outcome: Progressing Goal: Individualized Educational Video(s) Outcome: Progressing   Problem: Activity: Goal: Ability to tolerate increased activity will improve Outcome: Progressing   Problem: Pain Management: Goal: Pain level will decrease with appropriate interventions Outcome: Progressing   Problem: Education: Goal: Knowledge of disease or condition will improve Outcome: Progressing Goal: Knowledge of secondary prevention will improve (MUST DOCUMENT ALL) Outcome: Progressing Goal: Knowledge of patient specific risk factors will improve Loraine Leriche N/A or DELETE if not current risk factor) Outcome: Progressing   Problem: Ischemic Stroke/TIA Tissue Perfusion: Goal: Complications of ischemic stroke/TIA will be minimized Outcome: Progressing   Problem: Coping: Goal: Will verbalize positive feelings about self Outcome: Progressing Goal: Will identify appropriate support needs Outcome: Progressing   Problem: Health Behavior/Discharge Planning: Goal: Ability to manage health-related needs will improve Outcome: Progressing Goal: Goals will be collaboratively established with patient/family Outcome: Progressing   Problem: Self-Care: Goal: Ability to participate in self-care as condition permits will improve Outcome: Progressing Goal: Verbalization of feelings and concerns over difficulty with self-care will improve Outcome: Progressing Goal: Ability to communicate needs accurately will improve Outcome: Progressing   Problem: Nutrition: Goal: Risk of aspiration will decrease Outcome: Progressing Goal: Dietary intake will improve Outcome: Progressing   Problem: Education: Goal: Knowledge of General Education information will improve Description: Including pain rating scale,  medication(s)/side effects and non-pharmacologic comfort measures Outcome: Progressing   Problem: Health Behavior/Discharge Planning: Goal: Ability to manage health-related needs will improve Outcome: Progressing   Problem: Clinical Measurements: Goal: Ability to maintain clinical measurements within normal limits will improve Outcome: Progressing Goal: Will remain free from infection Outcome: Progressing Goal: Diagnostic test results will improve Outcome: Progressing Goal: Respiratory complications will improve Outcome: Progressing Goal: Cardiovascular complication will be avoided Outcome: Progressing   Problem: Activity: Goal: Risk for activity intolerance will decrease Outcome: Progressing   Problem: Nutrition: Goal: Adequate nutrition will be maintained Outcome: Progressing   Problem: Coping: Goal: Level of anxiety will decrease Outcome: Progressing   Problem: Elimination: Goal: Will not experience complications related to bowel motility Outcome: Progressing Goal: Will not experience complications related to urinary retention Outcome: Progressing   Problem: Pain Managment: Goal: General experience of comfort will improve Outcome: Progressing   Problem: Safety: Goal: Ability to remain free from injury will improve Outcome: Progressing   Problem: Skin Integrity: Goal: Risk for impaired skin integrity will decrease Outcome: Progressing   Problem: Education: Goal: Ability to describe self-care measures that may prevent or decrease complications (Diabetes Survival Skills Education) will improve Outcome: Progressing Goal: Individualized Educational Video(s) Outcome: Progressing   Problem: Coping: Goal: Ability to adjust to condition or change in health will improve Outcome: Progressing   Problem: Fluid Volume: Goal: Ability to maintain a balanced intake and output will improve Outcome: Progressing   Problem: Health Behavior/Discharge Planning: Goal:  Ability to identify and utilize available resources and services will improve Outcome: Progressing Goal: Ability to manage health-related needs will improve Outcome: Progressing   Problem: Metabolic: Goal: Ability to maintain appropriate glucose levels will improve Outcome: Progressing   Problem: Nutritional: Goal: Maintenance of adequate nutrition will improve Outcome: Progressing Goal: Progress toward achieving an optimal weight will improve Outcome: Progressing   Problem: Skin Integrity: Goal: Risk for impaired skin integrity will decrease Outcome: Progressing   Problem: Tissue Perfusion: Goal: Adequacy of tissue perfusion will improve Outcome: Progressing

## 2023-01-01 NOTE — Progress Notes (Signed)
PROGRESS NOTE    Tina Frank  WUJ:[id number] DOB: [birth year] DOA: 12/22/2022 PCP: Sigmund Hazel, MD    Chief Complaint  Patient presents with   Altered Mental Status    Brief Narrative:  Tina Frank is a 82 y.o. female with a history of diabetes mellitus type 2, hyperparathyroidism s/p parathyroidectomy, primary hypertension, chronic diastolic heart failure, CVA . Patient presented secondary to worsening confusion and refusal to eat, drink or take medication and found to have acute metabolic encephalopathy secondary to UTI and complicated by associated AKI and hypercalcemia.    Assessment & Plan:   Principal Problem:   Acute encephalopathy Active Problems:   Hypercalcemia   UTI (urinary tract infection)   AKI (acute kidney injury) (HCC)   Atrial fibrillation (HCC)   Elevated troponin   Chronic heart failure with preserved ejection fraction (HFpEF) (HCC)  #1 acute metabolic encephalopathy -Felt presumed secondary to UTI in the setting of hypercalcemia. -Patient noted to have some improvement with mental status however some concerns for waxing and waning as he became quite confused again on 12/28/2022.??  Sundowning. -Improving slowly clinically. -TSH 1.499, free T41.46, vitamin B12 of 362, vitamin B1 pending.  Ammonia level < 10. -MRI brain with no acute intracranial process, no evidence of acute or subacute infarct noted. -EEG done with mild to moderate diffuse encephalopathy, no seizures or epileptiform discharges were seen throughout the recording. -Continue supportive care with IV fluids.  2.  Hypercalcemia/history of primary hyperparathyroidism status post parathyroidectomy in 2014 -??  Etiology of hypercalcemia. -May be secondary to exogenous calcium from home calcium and vitamin D supplementation. -PTH at 14 appropriately suppressed, calcitriol levels noted low, vitamin D 25 hydroxy within normal limits.  TSH within normal limits. -PTH RP pending, UPEP  pending. -Kappa lambda light chain ratio elevated at 2.64. -SPEP with 0.4% M spike which was 0.2% 2 months ago. -Patient seen in consultation by nephrology, placed on IV fluids with calcium level slowly trending down. -Corrected calcium at 12.48 today. -Nephrology recommending holding off on calcitonin and oral bisphosphonates at this time. -Will need outpatient follow-up with endocrinology. -Nephrology following and appreciate input and recommendations. -Nephrology recommending oncology input. -Will consult with oncology for further evaluation due to abnormal kappa lambda light chain ratio and abnormal SPEP.  3.  AKI -Felt likely secondary to a prerenal azotemia in the setting of dehydration and hypercalcemia. -Renal function improved with hydration and currently resolved.  4.  Hypokalemia/hypomagnesemia -Magnesium at 2.1, potassium at 3.1. -Replete potassium. -Repeat labs in AM.  5.  E. coli UTI -Was on IV Rocephin, transitioned to [facility] and completed 7-day course of treatment. -No further antibiotics needed.  6.  New onset A-fib -2D echo from 10/13/2022 with a EF of 60 to 65%, grade 1 DD and mildly dilated left atrium. -Patient with history of bradycardia arrhythmia for which she was sent to cardiology in May 2024. -Patient felt not a great candidate for long-term anticoagulation but fall risk now is lower due to debility per son. -Patient placed on Xarelto which is preferred medication per family.  7.  Elevated troponin -Currently asymptomatic. -Prior physician spoke with cardiology who reviewed the chart and felt no concerns for ACS.  Patient currently asymptomatic. -2D echo with normal EF, no significant valvular abnormality.  8.  Hypertension -Patient noted to have been on clonidine, hydralazine in the outpatient setting which were held on admission. -Continue current regimen of hydralazine.   9.  Well-controlled, diet controlled diabetes mellitus type 2 -Hemoglobin  A1c  6.0 (10/13/2022) -CBG 98 this morning. -SSI.  10.?  Vertigo -Patient states whenever she turns her head has a brief episode of spinning sensation which subsequently resolves on its own. -Patient does endorse a prior history of vertigo. -Place on meclizine as needed.       DVT prophylaxis: Xarelto Code Status: DNR Family Communication: Updated patient.  No family at bedside. Disposition: SNF when medically stable.  Status is: Inpatient Remains inpatient appropriate because: Severity of illness   Consultants:  Nephrology: Dr. Thedore Mins 12/29/2022  Procedures:  Chest x-ray 12/22/2022 MRI brain 12/28/2022 2D echo 12/24/2022 EEG 12/29/2022  Antimicrobials:  Anti-infectives (From admission, onward)    Start     Dose/Rate Route Frequency Ordered Stop   12/26/22 0000  cefadroxil (DURICEF) 500 MG capsule        500 mg Oral 2 times daily 12/26/22 0938 12/30/22 2359   12/25/22 1000  cefadroxil (DURICEF) capsule 500 mg        500 mg Oral 2 times daily 12/25/22 0845 12/29/22 2240   12/23/22 1800  cefTRIAXone (ROCEPHIN) 2 g in sodium chloride 0.9 % 100 mL IVPB  Status:  Discontinued        2 g 200 mL/hr over 30 Minutes Intravenous Every 24 hours 12/22/22 2127 12/25/22 0845   12/22/22 1800  cefTRIAXone (ROCEPHIN) 2 g in sodium chloride 0.9 % 100 mL IVPB        2 g 200 mL/hr over 30 Minutes Intravenous  Once 12/22/22 1756 12/22/22 1950         Subjective: Sitting up in bed on the bedpan.  Denies any chest pain or shortness of breath.  No abdominal pain.  States has been feeling a little bit of dizziness when she turns her head especially on the right that only last about a couple of minutes and subsequently resolves.  Does endorse a prior history of vertigo but states was over 10 years ago.    Objective: Vitals:   12/31/22 2110 12/31/22 2111 01/01/23 0437 01/01/23 0455  BP: (!) 138/113 (!) 138/113 (!) 164/63   Pulse: 64 64 64   Resp:   16   Temp:   98.6 F (37 C)   TempSrc:    Oral   SpO2:   99%   Weight:    55.7 kg  Height:        Intake/Output Summary (Last 24 hours) at 01/01/2023 1237 Last data filed at 01/01/2023 0500 Gross per 24 hour  Intake 2325.34 ml  Output 1900 ml  Net 425.34 ml   Filed Weights   12/30/22 0500 12/31/22 0500 01/01/23 0455  Weight: 57.3 kg 55.8 kg 55.7 kg    Examination:  General exam: NAD Respiratory system: Lungs clear to auscultation bilaterally.  No wheezes, no crackles, no rhonchi.  Fair air movement.  Speaking in full sentences.  Normal respiratory effort.   Cardiovascular system: Regular rate rhythm no murmurs rubs or gallops.  No JVD.  No pitting lower extremity edema. Gastrointestinal system: Abdomen soft, nontender, nondistended, positive bowel sounds.  No rebound.  No guarding.   Central nervous system: Alert and oriented.  Moving extremities spontaneously.  No focal neurological deficits. Extremities: Symmetric 5 x 5 power. Skin: No rashes, lesions or ulcers Psychiatry: Judgement and insight appear normal. Mood & affect appropriate.     Data Reviewed: I have personally reviewed following labs and imaging studies  CBC: Recent Labs  Lab 12/28/22 1256 12/29/22 0400 12/30/22 0433 12/31/22 0409 01/01/23 0416  WBC 9.0 8.6 8.3 9.8 8.4  NEUTROABS 6.3  --   --   --   --   HGB 11.3* 10.4* 10.2* 10.2* 9.3*  HCT 34.5* 32.2* 31.9* 31.7* 28.9*  MCV 93.0 92.5 94.4 94.1 94.4  PLT 230 219 197 230 209    Basic Metabolic Panel: Recent Labs  Lab 12/26/22 0347 12/28/22 1256 12/29/22 0400 12/30/22 0433 12/31/22 0407 12/31/22 0409 01/01/23 0416  NA 132* 134* 133* 137  --  136 136  K 3.4* 3.8 3.6 3.7  --  3.1* 3.1*  CL 102 101 102 107  --  110 109  CO2 24 26 24 22   --  21* 21*  GLUCOSE 92 114* 117* 98  --  105* 102*  BUN 16 15 20 23   --  17 15  CREATININE 0.90 0.84 1.13* 0.92  --  0.90 0.67  CALCIUM 10.8* 12.5* 12.3* 12.2*  --  11.7* 11.2*  MG 1.8  --   --   --  1.6*  --  2.1  PHOS  --   --   --   --   --    --  2.7    GFR: Estimated Creatinine Clearance: 46 mL/min (by C-G formula based on SCr of 0.67 mg/dL).  Liver Function Tests: Recent Labs  Lab 12/28/22 1256 01/01/23 0416  AST 17  --   ALT 14  --   ALKPHOS 85  --   BILITOT 0.8  --   PROT 6.5  --   ALBUMIN 3.0* 2.4*    CBG: Recent Labs  Lab 12/31/22 1111 12/31/22 1639 12/31/22 2042 01/01/23 0727 01/01/23 1158  GLUCAP 148* 114* 134* 99 98     Recent Results (from the past 240 hour(s))  Urine Culture     Status: Abnormal   Collection Time: 12/22/22  4:56 PM   Specimen: Urine, Clean Catch  Result Value Ref Range Status   Specimen Description   Final    URINE, CLEAN CATCH Performed at [facility], [address], [place], [place] [address]    Special Requests   Final    NONE Performed at [facility], [address] [place], [place] [address]    Culture >=100,000 COLONIES/mL ESCHERICHIA COLI (A)  Final   Report Status 12/25/2022 FINAL  Final   Organism ID, Bacteria ESCHERICHIA COLI (A)  Final      Susceptibility   Escherichia coli - MIC*    AMPICILLIN >=32 RESISTANT Resistant     CEFAZOLIN <=4 SENSITIVE Sensitive     CEFEPIME <=0.12 SENSITIVE Sensitive     CEFTRIAXONE <=0.25 SENSITIVE Sensitive     CIPROFLOXACIN >=4 RESISTANT Resistant     GENTAMICIN >=16 RESISTANT Resistant     IMIPENEM <=0.25 SENSITIVE Sensitive     NITROFURANTOIN <=16 SENSITIVE Sensitive     TRIMETH/SULFA <=20 SENSITIVE Sensitive     AMPICILLIN/SULBACTAM 16 INTERMEDIATE Intermediate     PIP/TAZO <=4 SENSITIVE Sensitive ug/mL    * >=100,000 COLONIES/mL ESCHERICHIA COLI  Blood culture (routine x 2)     Status: None   Collection Time: 12/22/22  6:34 PM   Specimen: BLOOD RIGHT HAND  Result Value Ref Range Status   Specimen Description   Final    BLOOD RIGHT HAND Performed at [facility], [address], [place], [place] [address]    Special Requests   Final    BOTTLES DRAWN AEROBIC AND  ANAEROBIC Blood Culture results may not be optimal due  to an inadequate volume of blood received in culture bottles Performed at [facility], [address] [place], [place] [address]    Culture   Final    NO GROWTH 5 DAYS Performed at [facility], [address] [place], [place] [address]    Report Status 12/27/2022 FINAL  Final         Radiology Studies: No results found.      Scheduled Meds:  aspirin EC  81 mg Oral Daily   feeding supplement  237 mL Oral BID BM   hydrALAZINE  50 mg Oral Q8H   insulin aspart  0-15 Units Subcutaneous TID WC   mouth rinse  15 mL Mouth Rinse 4 times per day   pantoprazole  40 mg Oral Daily   potassium chloride  40 mEq Oral Q4H   Rivaroxaban  15 mg Oral Q supper   sodium chloride flush  3 mL Intravenous Q12H   Continuous Infusions:  sodium chloride 125 mL/hr at 12/31/22 2320     LOS: 10 days    Time spent: 35 minutes    Ramiro Harvest, MD [facility]   To contact the attending provider between 7A-7P or the covering provider during after hours 7P-7A, please log into the web site [email or web address] and access using universal [place] password for that web site. If you do not have the password, please call the hospital operator.  01/01/2023, 12:37 PM

## 2023-01-01 NOTE — Progress Notes (Signed)
Physical Therapy Treatment Patient Details Name: Tina Frank MRN: [id number] DOB: Sep 10, 1940 Today's Date: 01/01/2023   History of Present Illness 82 y.o. female with a history of diabetes mellitus type 2, hyperparathyroidism s/p parathyroidectomy, primary hypertension, chronic diastolic heart failure, internal capsule infarct August 2024, R reverse TSA 04/27/22 .  Patient presented secondary to worsening confusion and refusal to eat, drink or take medication and found to have acute metabolic encephalopathy secondary to UTI and complicated by associated AKI and hypercalcemia    PT Comments  Pt. was assisted out of bed onto [facility], then onto chair. She required max assist + 2 when performing transfers. During transfers, she was fearful, weak, and unable to stand for long duration. LPT has recommended SNF.   If plan is discharge home, recommend the following: A lot of help with bathing/dressing/bathroom;Two people to help with walking and/or transfers;Assistance with cooking/housework;Assist for transportation;Help with stairs or ramp for entrance   Can travel by private vehicle     No  Equipment Recommendations  None recommended by PT    Recommendations for Other Services       Precautions / Restrictions Precautions Precautions: Fall Precaution Comments: hx of falls (with R shoulder fx in Feb 2024) Restrictions Weight Bearing Restrictions: No     Mobility  Bed Mobility Overal bed mobility: Needs Assistance Bed Mobility: Supine to Sit     Supine to sit: Max assist, +2 for physical assistance     General bed mobility comments: Pt. was very hesitant to lean forward when performing going from supine to sit; Bedpad was used to complete scooting to EOB.    Transfers Overall transfer level: Needs assistance Equipment used: None Transfers: Sit to/from Stand Sit to Stand: From elevated surface, Max assist, +2 safety/equipment, +2 physical assistance           General  transfer comment: Performed stand pivot sit from elevated bed to bedside commode. Posterior lean with bilateral feet "skiing". Transfered again from bedside commode to recliner. Pt. was weak and fearful. Pt. would grip the commode and not let go when brought to standing position, due to fear.    Ambulation/Gait               General Gait Details: unable   Stairs             Wheelchair Mobility     Tilt Bed    Modified Rankin (Stroke Patients Only)       Balance                                            Cognition Arousal: Alert Behavior During Therapy: WFL for tasks assessed/performed Overall Cognitive Status: No family/caregiver present to determine baseline cognitive functioning                                 General Comments: fearful of falling        Exercises      General Comments        Pertinent Vitals/Pain Pain Assessment Pain Assessment: Faces Faces Pain Scale: Hurts a little bit (R shoulder & buttocks)    Home Living                          Prior Function  PT Goals (current goals can now be found in the care plan section) Progress towards PT goals: Progressing toward goals    Frequency    Min 1X/week      PT Plan      Co-evaluation              AM-PAC PT "6 Clicks" Mobility   Outcome Measure  Help needed turning from your back to your side while in a flat bed without using bedrails?: A Lot Help needed moving from lying on your back to sitting on the side of a flat bed without using bedrails?: A Lot Help needed moving to and from a bed to a chair (including a wheelchair)?: Total Help needed standing up from a chair using your arms (e.g., wheelchair or bedside chair)?: Total Help needed to walk in hospital room?: Total Help needed climbing 3-5 steps with a railing? : Total 6 Click Score: 8    End of Session Equipment Utilized During Treatment: Gait  belt Activity Tolerance: Patient limited by fatigue Patient left: with chair alarm set;with call bell/phone within reach;in chair   PT Visit Diagnosis: History of falling (Z91.81);Difficulty in walking, not elsewhere classified (R26.2)     Time: 2952-8413 PT Time Calculation (min) (ACUTE ONLY): 25 min  Charges:    $Therapeutic Activity: 23-37 mins PT General Charges $$ ACUTE PT VISIT: 1 Visit                        Lazaro Arms, SPTA 01/01/2023, 4:28 PM

## 2023-01-01 NOTE — Plan of Care (Signed)
  Problem: Activity: Goal: Risk for activity intolerance will decrease Outcome: Progressing   Problem: Skin Integrity: Goal: Risk for impaired skin integrity will decrease Outcome: Progressing   

## 2023-01-02 DIAGNOSIS — I5032 Chronic diastolic (congestive) heart failure: Secondary | ICD-10-CM

## 2023-01-02 DIAGNOSIS — D649 Anemia, unspecified: Secondary | ICD-10-CM | POA: Diagnosis not present

## 2023-01-02 DIAGNOSIS — I509 Heart failure, unspecified: Secondary | ICD-10-CM

## 2023-01-02 DIAGNOSIS — E1122 Type 2 diabetes mellitus with diabetic chronic kidney disease: Secondary | ICD-10-CM

## 2023-01-02 DIAGNOSIS — Z8673 Personal history of transient ischemic attack (TIA), and cerebral infarction without residual deficits: Secondary | ICD-10-CM

## 2023-01-02 DIAGNOSIS — G9340 Encephalopathy, unspecified: Secondary | ICD-10-CM | POA: Diagnosis not present

## 2023-01-02 DIAGNOSIS — I4891 Unspecified atrial fibrillation: Secondary | ICD-10-CM | POA: Diagnosis not present

## 2023-01-02 DIAGNOSIS — D7589 Other specified diseases of blood and blood-forming organs: Secondary | ICD-10-CM

## 2023-01-02 DIAGNOSIS — N179 Acute kidney failure, unspecified: Secondary | ICD-10-CM | POA: Diagnosis not present

## 2023-01-02 LAB — CBC
HCT: 28.7 % — ABNORMAL LOW (ref 36.0–46.0)
Hemoglobin: 9.2 g/dL — ABNORMAL LOW (ref 12.0–15.0)
MCH: 29.9 pg (ref 26.0–34.0)
MCHC: 32.1 g/dL (ref 30.0–36.0)
MCV: 93.2 fL (ref 80.0–100.0)
Platelets: 206 10*3/uL (ref 150–400)
RBC: 3.08 MIL/uL — ABNORMAL LOW (ref 3.87–5.11)
RDW: 14.7 % (ref 11.5–15.5)
WBC: 7.8 10*3/uL (ref 4.0–10.5)
nRBC: 0 % (ref 0.0–0.2)

## 2023-01-02 LAB — GLUCOSE, CAPILLARY
Glucose-Capillary: 95 mg/dL (ref 70–99)
Glucose-Capillary: 122 mg/dL — ABNORMAL HIGH (ref 70–99)
Glucose-Capillary: 101 mg/dL — ABNORMAL HIGH (ref 70–99)
Glucose-Capillary: 108 mg/dL — ABNORMAL HIGH (ref 70–99)

## 2023-01-02 LAB — BASIC METABOLIC PANEL
Anion gap: 3 — ABNORMAL LOW (ref 5–15)
BUN: 12 mg/dL (ref 8–23)
CO2: 22 mmol/L (ref 22–32)
Calcium: 11.1 mg/dL — ABNORMAL HIGH (ref 8.9–10.3)
Chloride: 111 mmol/L (ref 98–111)
Creatinine, Ser: 0.63 mg/dL (ref 0.44–1.00)
GFR, Estimated: >60 mL/min (ref >60)
Glucose, Bld: 101 mg/dL — ABNORMAL HIGH (ref 70–99)
Potassium: 3.1 mmol/L — ABNORMAL LOW (ref 3.5–5.1)
Sodium: 136 mmol/L (ref 135–145)

## 2023-01-02 LAB — PTH-RELATED PEPTIDE: PTH-related peptide: <2 pmol/L

## 2023-01-02 LAB — MAGNESIUM: Magnesium: 1.8 mg/dL (ref 1.7–2.4)

## 2023-01-02 MED ORDER — POTASSIUM CHLORIDE 20 MEQ PO PACK
40.0000 meq | PACK | ORAL | Status: AC
  Administered 2023-01-02 (×2): 40 meq via ORAL
  Filled 2023-01-02 (×2): qty 2

## 2023-01-02 MED ORDER — HYDRALAZINE HCL 50 MG PO TABS
75.0000 mg | ORAL_TABLET | Freq: Three times a day (TID) | ORAL | Status: DC
  Administered 2023-01-02 – 2023-01-03 (×3): 75 mg via ORAL
  Filled 2023-01-02 (×4): qty 1

## 2023-01-02 MED ORDER — SODIUM CHLORIDE 0.9 % IV SOLN: INTRAVENOUS | Status: DC

## 2023-01-02 MED ORDER — MAGNESIUM SULFATE 2 GM/50ML IV SOLN
2.0000 g | Freq: Once | INTRAVENOUS | Status: AC
  Administered 2023-01-02: 2 g via INTRAVENOUS
  Filled 2023-01-02: qty 50

## 2023-01-02 NOTE — Plan of Care (Signed)
  Problem: Education: Goal: Knowledge of the prescribed therapeutic regimen will improve Outcome: Progressing Goal: Understanding of activity limitations/precautions following surgery will improve Outcome: Progressing Goal: Individualized Educational Video(s) Outcome: Progressing   Problem: Activity: Goal: Ability to tolerate increased activity will improve Outcome: Progressing   Problem: Pain Management: Goal: Pain level will decrease with appropriate interventions Outcome: Progressing   Problem: Education: Goal: Knowledge of disease or condition will improve Outcome: Progressing Goal: Knowledge of secondary prevention will improve (MUST DOCUMENT ALL) Outcome: Progressing Goal: Knowledge of patient specific risk factors will improve Loraine Leriche N/A or DELETE if not current risk factor) Outcome: Progressing   Problem: Ischemic Stroke/TIA Tissue Perfusion: Goal: Complications of ischemic stroke/TIA will be minimized Outcome: Progressing   Problem: Coping: Goal: Will verbalize positive feelings about self Outcome: Progressing Goal: Will identify appropriate support needs Outcome: Progressing   Problem: Health Behavior/Discharge Planning: Goal: Ability to manage health-related needs will improve Outcome: Progressing Goal: Goals will be collaboratively established with patient/family Outcome: Progressing   Problem: Self-Care: Goal: Ability to participate in self-care as condition permits will improve Outcome: Progressing Goal: Verbalization of feelings and concerns over difficulty with self-care will improve Outcome: Progressing Goal: Ability to communicate needs accurately will improve Outcome: Progressing   Problem: Nutrition: Goal: Risk of aspiration will decrease Outcome: Progressing Goal: Dietary intake will improve Outcome: Progressing   Problem: Education: Goal: Knowledge of General Education information will improve Description: Including pain rating scale,  medication(s)/side effects and non-pharmacologic comfort measures Outcome: Progressing   Problem: Health Behavior/Discharge Planning: Goal: Ability to manage health-related needs will improve Outcome: Progressing   Problem: Clinical Measurements: Goal: Ability to maintain clinical measurements within normal limits will improve Outcome: Progressing Goal: Will remain free from infection Outcome: Progressing Goal: Diagnostic test results will improve Outcome: Progressing Goal: Respiratory complications will improve Outcome: Progressing Goal: Cardiovascular complication will be avoided Outcome: Progressing   Problem: Activity: Goal: Risk for activity intolerance will decrease Outcome: Progressing   Problem: Nutrition: Goal: Adequate nutrition will be maintained Outcome: Progressing   Problem: Coping: Goal: Level of anxiety will decrease Outcome: Progressing   Problem: Elimination: Goal: Will not experience complications related to bowel motility Outcome: Progressing Goal: Will not experience complications related to urinary retention Outcome: Progressing   Problem: Pain Managment: Goal: General experience of comfort will improve Outcome: Progressing   Problem: Safety: Goal: Ability to remain free from injury will improve Outcome: Progressing   Problem: Skin Integrity: Goal: Risk for impaired skin integrity will decrease Outcome: Progressing   Problem: Education: Goal: Ability to describe self-care measures that may prevent or decrease complications (Diabetes Survival Skills Education) will improve Outcome: Progressing Goal: Individualized Educational Video(s) Outcome: Progressing   Problem: Coping: Goal: Ability to adjust to condition or change in health will improve Outcome: Progressing   Problem: Fluid Volume: Goal: Ability to maintain a balanced intake and output will improve Outcome: Progressing   Problem: Health Behavior/Discharge Planning: Goal:  Ability to identify and utilize available resources and services will improve Outcome: Progressing Goal: Ability to manage health-related needs will improve Outcome: Progressing   Problem: Metabolic: Goal: Ability to maintain appropriate glucose levels will improve Outcome: Progressing   Problem: Nutritional: Goal: Maintenance of adequate nutrition will improve Outcome: Progressing Goal: Progress toward achieving an optimal weight will improve Outcome: Progressing   Problem: Skin Integrity: Goal: Risk for impaired skin integrity will decrease Outcome: Progressing   Problem: Tissue Perfusion: Goal: Adequacy of tissue perfusion will improve Outcome: Progressing

## 2023-01-02 NOTE — Consult Note (Addendum)
[facility]  Telephone:([phone]) [phone]   HEMATOLOGY ONCOLOGY INPATIENT CONSULTATION   Tina Frank  DOB: 03-09-1941  MR#: [id number]  CSN#: [id number]    Requesting Physician: [facility]  Patient Care Team: Sigmund Hazel, MD as PCP - General (Family Medicine) Glyn Ade, PA-C as Physician Assistant (Dermatology)  Reason for consult: Hypercalcemia, (+) M protein   History of present illness:   82 year old female with past medical history of hyperparathyroidism status post inferior parathyroidectomy in 2014, diabetes, hypertension, chronic CHF, CVA in August 2024, who was admitted on December 22, 2022 for altered mental status, felt to be related to UTI, new onset hypercalcemia.  She also had a mild AKI on admission, which has resolved after hydration.  Her lab work also showed positive monoclonal protein in blood, 0.7, and slightly elevated light chain level.  Due to the persistent hypercalcemia and concerns for MM, I was called to evaluate patient.  Patient used to live independently at home, her house has gone down significantly since her mechanical fall which resulted right humeral shaft and proximal humerus fractures.  She underwent surgery and went to SNF after that.  She was admitted again on October 12, 2022 for dysphagia, which felt to be related to her stroke.  She was again sent to nursing home after hospital stay.  She was readmitted on December 22, 2022 for altered mental status, refused to eat etc. her mental status has recovered poorly, but she is still very weak.  She has lost about 20 pounds in the past several months.  She denies any significant pain except the pain from right shoulder, she also reports constipation, but no hematochezia or nausea.  MEDICAL HISTORY:  Past Medical History:  Diagnosis Date   CKD (chronic kidney disease), stage II    nephrologist-- dr Kathrene Bongo  [facility])   Diabetes mellitus type 2, diet-controlled (HCC)     followed by pcp---   (09-20-2019  per pt does not check blood sugar at home)   Fracture of humeral shaft, right, closed 04/25/2022   GERD (gastroesophageal reflux disease)    History of primary hyperparathyroidism    s/p  parathyroidectomy right inferior on 12-02-2012   Humerus fracture 04/25/2022   Hyperlipidemia    Hypertension    followd by nephrologist----  (09-20-2019  per pt had stress test done some time ago , unsure when/ where, thinks told ok)   OA (osteoarthritis)    Osteoporosis    Prosthetic joint infection (HCC)    followed by dr j. hatcher ([place])   left total shoulder arthroplasty 12/ 2016  ; 12/ 2020  revision with explant joint replacement and hemiarthroplasty;  completed 6 month antibiotic 05/ 2021   Vertigo     SURGICAL HISTORY: Past Surgical History:  Procedure Laterality Date   COLONOSCOPY     DILATATION & CURETTAGE/HYSTEROSCOPY WITH MYOSURE N/A 09/28/2019   Procedure: DILATATION & CURETTAGE/HYSTEROSCOPY WITH MYOSURE;  Surgeon: Olivia Mackie, MD;  Location: [facility] [place];  Service: Gynecology;  Laterality: N/A;   EYE SURGERY  yrs ago   laser surgery for retinal tear.  (unilateral , pt unsure which side)   ORIF HUMERUS FRACTURE Right 04/27/2022   Procedure: OPEN REDUCTION INTERNAL FIXATION (ORIF) PROXIMAL HUMERUS FRACTURE;  Surgeon: Bjorn Pippin, MD;  Location: [facility];  Service: Orthopedics;  Laterality: Right;   PARATHYROIDECTOMY N/A 12/02/2012   Procedure: PARATHYROIDECTOMY with frozen section ;  Surgeon: Velora Heckler, MD;  Location: [facility];  Service:  continuously and digitally stored.  Video monitoring was available and reviewed as appropriate. Description: The posterior dominant rhythm consists of 8 Hz activity of moderate voltage (25-35 uV) seen predominantly in posterior head regions, symmetric and reactive to eye opening and eye closing. EEG showed intermittent generalized 3 to 6 Hz theta-delta slowing. Hyperventilation and photic stimulation were not performed.   ABNORMALITY - Intermittent slow, generalized IMPRESSION: This study is suggestive of mild to moderate diffuse encephalopathy. No seizures or epileptiform discharges were seen throughout the recording. [name]   MR BRAIN WO CONTRAST  Result Date: 12/29/2022 CLINICAL DATA:  Altered mental status EXAM: MRI HEAD WITHOUT CONTRAST TECHNIQUE: Multiplanar, multiecho pulse sequences of the brain and surrounding structures were obtained without intravenous contrast. COMPARISON:  10/12/2022 FINDINGS: Brain: No restricted diffusion to suggest acute or subacute infarct. No acute hemorrhage, mass, mass effect, or midline shift. No hydrocephalus or extra-axial collection. Pituitary and craniocervical junction within normal limits. Redemonstrated hemosiderin staining in the right occipital lobe. Confluent T2 hyperintense signal in the periventricular white matter and pons, likely the sequela of moderate chronic small vessel ischemic disease. Vascular: Normal arterial flow voids. Skull and upper cervical spine:  Normal marrow signal. Sinuses/Orbits: Clear paranasal sinuses. No acute finding in the orbits. Status post bilateral lens replacements. Other: The mastoid air cells are well aerated. IMPRESSION: No acute intracranial process. No evidence of acute or subacute infarct. Electronically Signed   By: Wiliam Ke M.D.   On: 12/29/2022 01:42   ECHOCARDIOGRAM LIMITED  Result Date: 12/24/2022    ECHOCARDIOGRAM LIMITED REPORT   Patient Name:   Tina Frank Date of Exam: 12/24/2022 Medical Rec #:  [id number]       Height:       65.0 in Accession #:    [id number]      Weight:       125.7 lb Date of Birth:  Jul 31, 1940       BSA:          1.624 m Patient Age:    82 years        BP:           160/63 mmHg Patient Gender: F               HR:           68 bpm. Exam Location:  Inpatient Procedure: Limited Echo, Limited Color Doppler and Cardiac Doppler Indications:    Atrial fibrillation  History:        Patient has prior history of Echocardiogram examinations, most                 recent 10/13/2022. Risk Factors:Hypertension, Dyslipidemia and                 Diabetes. CKD.  Sonographer:    Milda Smart Referring Phys: [id number] PRANAV M PATEL  Sonographer Comments: Suboptimal subcostal window. Image acquisition challenging due to respiratory motion. IMPRESSIONS  1. Left ventricular ejection fraction, by estimation, is 60 to 65%. The left ventricle has normal function.  2. Right ventricular systolic function is normal. The right ventricular size is normal. Tricuspid regurgitation signal is inadequate for assessing PA pressure.  3. The mitral valve is grossly normal. Trivial mitral valve regurgitation. No evidence of mitral stenosis.  4. The aortic valve is tricuspid. There is mild calcification of the aortic valve. Aortic valve regurgitation is trivial. Aortic valve sclerosis is present, with no evidence of aortic valve stenosis.  5. The inferior vena  [facility]  Telephone:(336) [phone]   HEMATOLOGY ONCOLOGY INPATIENT CONSULTATION   Tina Frank  DOB: 03-09-1941  MR#: [id number]  CSN#: [id number]    Requesting Physician: [facility]  Patient Care Team: Sigmund Hazel, MD as PCP - General (Family Medicine) Glyn Ade, PA-C as Physician Assistant (Dermatology)  Reason for consult: Hypercalcemia, (+) M protein   History of present illness:   82 year old female with past medical history of hyperparathyroidism status post inferior parathyroidectomy in 2014, diabetes, hypertension, chronic CHF, CVA in August 2024, who was admitted on December 22, 2022 for altered mental status, felt to be related to UTI, new onset hypercalcemia.  She also had a mild AKI on admission, which has resolved after hydration.  Her lab work also showed positive monoclonal protein in blood, 0.7, and slightly elevated light chain level.  Due to the persistent hypercalcemia and concerns for MM, I was called to evaluate patient.  Patient used to live independently at home, her house has gone down significantly since her mechanical fall which resulted right humeral shaft and proximal humerus fractures.  She underwent surgery and went to SNF after that.  She was admitted again on October 12, 2022 for dysphagia, which felt to be related to her stroke.  She was again sent to nursing home after hospital stay.  She was readmitted on December 22, 2022 for altered mental status, refused to eat etc. her mental status has recovered poorly, but she is still very weak.  She has lost about 20 pounds in the past several months.  She denies any significant pain except the pain from right shoulder, she also reports constipation, but no hematochezia or nausea.  MEDICAL HISTORY:  Past Medical History:  Diagnosis Date   CKD (chronic kidney disease), stage II    nephrologist-- dr Kathrene Bongo  [facility])   Diabetes mellitus type 2, diet-controlled (HCC)     followed by pcp---   (09-20-2019  per pt does not check blood sugar at home)   Fracture of humeral shaft, right, closed 04/25/2022   GERD (gastroesophageal reflux disease)    History of primary hyperparathyroidism    s/p  parathyroidectomy right inferior on 12-02-2012   Humerus fracture 04/25/2022   Hyperlipidemia    Hypertension    followd by nephrologist----  (09-20-2019  per pt had stress test done some time ago , unsure when/ where, thinks told ok)   OA (osteoarthritis)    Osteoporosis    Prosthetic joint infection (HCC)    followed by dr j. hatcher ([place])   left total shoulder arthroplasty 12/ 2016  ; 12/ 2020  revision with explant joint replacement and hemiarthroplasty;  completed 6 month antibiotic 05/ 2021   Vertigo     SURGICAL HISTORY: Past Surgical History:  Procedure Laterality Date   COLONOSCOPY     DILATATION & CURETTAGE/HYSTEROSCOPY WITH MYOSURE N/A 09/28/2019   Procedure: DILATATION & CURETTAGE/HYSTEROSCOPY WITH MYOSURE;  Surgeon: Olivia Mackie, MD;  Location: [facility] [place];  Service: Gynecology;  Laterality: N/A;   EYE SURGERY  yrs ago   laser surgery for retinal tear.  (unilateral , pt unsure which side)   ORIF HUMERUS FRACTURE Right 04/27/2022   Procedure: OPEN REDUCTION INTERNAL FIXATION (ORIF) PROXIMAL HUMERUS FRACTURE;  Surgeon: Bjorn Pippin, MD;  Location: [facility];  Service: Orthopedics;  Laterality: Right;   PARATHYROIDECTOMY N/A 12/02/2012   Procedure: PARATHYROIDECTOMY with frozen section ;  Surgeon: Velora Heckler, MD;  Location: [facility];  Service:  [facility]  Telephone:([phone]) [phone]   HEMATOLOGY ONCOLOGY INPATIENT CONSULTATION   Tina Frank  DOB: 03-09-1941  MR#: [id number]  CSN#: [id number]    Requesting Physician: [facility]  Patient Care Team: Sigmund Hazel, MD as PCP - General (Family Medicine) Glyn Ade, PA-C as Physician Assistant (Dermatology)  Reason for consult: Hypercalcemia, (+) M protein   History of present illness:   82 year old female with past medical history of hyperparathyroidism status post inferior parathyroidectomy in 2014, diabetes, hypertension, chronic CHF, CVA in August 2024, who was admitted on December 22, 2022 for altered mental status, felt to be related to UTI, new onset hypercalcemia.  She also had a mild AKI on admission, which has resolved after hydration.  Her lab work also showed positive monoclonal protein in blood, 0.7, and slightly elevated light chain level.  Due to the persistent hypercalcemia and concerns for MM, I was called to evaluate patient.  Patient used to live independently at home, her house has gone down significantly since her mechanical fall which resulted right humeral shaft and proximal humerus fractures.  She underwent surgery and went to SNF after that.  She was admitted again on October 12, 2022 for dysphagia, which felt to be related to her stroke.  She was again sent to nursing home after hospital stay.  She was readmitted on December 22, 2022 for altered mental status, refused to eat etc. her mental status has recovered poorly, but she is still very weak.  She has lost about 20 pounds in the past several months.  She denies any significant pain except the pain from right shoulder, she also reports constipation, but no hematochezia or nausea.  MEDICAL HISTORY:  Past Medical History:  Diagnosis Date   CKD (chronic kidney disease), stage II    nephrologist-- dr Kathrene Bongo  [facility])   Diabetes mellitus type 2, diet-controlled (HCC)     followed by pcp---   (09-20-2019  per pt does not check blood sugar at home)   Fracture of humeral shaft, right, closed 04/25/2022   GERD (gastroesophageal reflux disease)    History of primary hyperparathyroidism    s/p  parathyroidectomy right inferior on 12-02-2012   Humerus fracture 04/25/2022   Hyperlipidemia    Hypertension    followd by nephrologist----  (09-20-2019  per pt had stress test done some time ago , unsure when/ where, thinks told ok)   OA (osteoarthritis)    Osteoporosis    Prosthetic joint infection (HCC)    followed by dr j. hatcher ([place])   left total shoulder arthroplasty 12/ 2016  ; 12/ 2020  revision with explant joint replacement and hemiarthroplasty;  completed 6 month antibiotic 05/ 2021   Vertigo     SURGICAL HISTORY: Past Surgical History:  Procedure Laterality Date   COLONOSCOPY     DILATATION & CURETTAGE/HYSTEROSCOPY WITH MYOSURE N/A 09/28/2019   Procedure: DILATATION & CURETTAGE/HYSTEROSCOPY WITH MYOSURE;  Surgeon: Olivia Mackie, MD;  Location: [facility] [place];  Service: Gynecology;  Laterality: N/A;   EYE SURGERY  yrs ago   laser surgery for retinal tear.  (unilateral , pt unsure which side)   ORIF HUMERUS FRACTURE Right 04/27/2022   Procedure: OPEN REDUCTION INTERNAL FIXATION (ORIF) PROXIMAL HUMERUS FRACTURE;  Surgeon: Bjorn Pippin, MD;  Location: [facility];  Service: Orthopedics;  Laterality: Right;   PARATHYROIDECTOMY N/A 12/02/2012   Procedure: PARATHYROIDECTOMY with frozen section ;  Surgeon: Velora Heckler, MD;  Location: [facility];  Service:  [facility]  Telephone:([phone]) [phone]   HEMATOLOGY ONCOLOGY INPATIENT CONSULTATION   Tina Frank  DOB: 03-09-1941  MR#: [id number]  CSN#: [id number]    Requesting Physician: [facility]  Patient Care Team: Sigmund Hazel, MD as PCP - General (Family Medicine) Glyn Ade, PA-C as Physician Assistant (Dermatology)  Reason for consult: Hypercalcemia, (+) M protein   History of present illness:   82 year old female with past medical history of hyperparathyroidism status post inferior parathyroidectomy in 2014, diabetes, hypertension, chronic CHF, CVA in August 2024, who was admitted on December 22, 2022 for altered mental status, felt to be related to UTI, new onset hypercalcemia.  She also had a mild AKI on admission, which has resolved after hydration.  Her lab work also showed positive monoclonal protein in blood, 0.7, and slightly elevated light chain level.  Due to the persistent hypercalcemia and concerns for MM, I was called to evaluate patient.  Patient used to live independently at home, her house has gone down significantly since her mechanical fall which resulted right humeral shaft and proximal humerus fractures.  She underwent surgery and went to SNF after that.  She was admitted again on October 12, 2022 for dysphagia, which felt to be related to her stroke.  She was again sent to nursing home after hospital stay.  She was readmitted on December 22, 2022 for altered mental status, refused to eat etc. her mental status has recovered poorly, but she is still very weak.  She has lost about 20 pounds in the past several months.  She denies any significant pain except the pain from right shoulder, she also reports constipation, but no hematochezia or nausea.  MEDICAL HISTORY:  Past Medical History:  Diagnosis Date   CKD (chronic kidney disease), stage II    nephrologist-- dr Kathrene Bongo  [facility])   Diabetes mellitus type 2, diet-controlled (HCC)     followed by pcp---   (09-20-2019  per pt does not check blood sugar at home)   Fracture of humeral shaft, right, closed 04/25/2022   GERD (gastroesophageal reflux disease)    History of primary hyperparathyroidism    s/p  parathyroidectomy right inferior on 12-02-2012   Humerus fracture 04/25/2022   Hyperlipidemia    Hypertension    followd by nephrologist----  (09-20-2019  per pt had stress test done some time ago , unsure when/ where, thinks told ok)   OA (osteoarthritis)    Osteoporosis    Prosthetic joint infection (HCC)    followed by dr j. hatcher ([place])   left total shoulder arthroplasty 12/ 2016  ; 12/ 2020  revision with explant joint replacement and hemiarthroplasty;  completed 6 month antibiotic 05/ 2021   Vertigo     SURGICAL HISTORY: Past Surgical History:  Procedure Laterality Date   COLONOSCOPY     DILATATION & CURETTAGE/HYSTEROSCOPY WITH MYOSURE N/A 09/28/2019   Procedure: DILATATION & CURETTAGE/HYSTEROSCOPY WITH MYOSURE;  Surgeon: Olivia Mackie, MD;  Location: [facility] [place];  Service: Gynecology;  Laterality: N/A;   EYE SURGERY  yrs ago   laser surgery for retinal tear.  (unilateral , pt unsure which side)   ORIF HUMERUS FRACTURE Right 04/27/2022   Procedure: OPEN REDUCTION INTERNAL FIXATION (ORIF) PROXIMAL HUMERUS FRACTURE;  Surgeon: Bjorn Pippin, MD;  Location: [facility];  Service: Orthopedics;  Laterality: Right;   PARATHYROIDECTOMY N/A 12/02/2012   Procedure: PARATHYROIDECTOMY with frozen section ;  Surgeon: Velora Heckler, MD;  Location: [facility];  Service:  [facility]  Telephone:([phone]) [phone]   HEMATOLOGY ONCOLOGY INPATIENT CONSULTATION   Tina Frank  DOB: 03-09-1941  MR#: [id number]  CSN#: [id number]    Requesting Physician: [facility]  Patient Care Team: Sigmund Hazel, MD as PCP - General (Family Medicine) Glyn Ade, PA-C as Physician Assistant (Dermatology)  Reason for consult: Hypercalcemia, (+) M protein   History of present illness:   82 year old female with past medical history of hyperparathyroidism status post inferior parathyroidectomy in 2014, diabetes, hypertension, chronic CHF, CVA in August 2024, who was admitted on December 22, 2022 for altered mental status, felt to be related to UTI, new onset hypercalcemia.  She also had a mild AKI on admission, which has resolved after hydration.  Her lab work also showed positive monoclonal protein in blood, 0.7, and slightly elevated light chain level.  Due to the persistent hypercalcemia and concerns for MM, I was called to evaluate patient.  Patient used to live independently at home, her house has gone down significantly since her mechanical fall which resulted right humeral shaft and proximal humerus fractures.  She underwent surgery and went to SNF after that.  She was admitted again on October 12, 2022 for dysphagia, which felt to be related to her stroke.  She was again sent to nursing home after hospital stay.  She was readmitted on December 22, 2022 for altered mental status, refused to eat etc. her mental status has recovered poorly, but she is still very weak.  She has lost about 20 pounds in the past several months.  She denies any significant pain except the pain from right shoulder, she also reports constipation, but no hematochezia or nausea.  MEDICAL HISTORY:  Past Medical History:  Diagnosis Date   CKD (chronic kidney disease), stage II    nephrologist-- dr Kathrene Bongo  [facility])   Diabetes mellitus type 2, diet-controlled (HCC)     followed by pcp---   (09-20-2019  per pt does not check blood sugar at home)   Fracture of humeral shaft, right, closed 04/25/2022   GERD (gastroesophageal reflux disease)    History of primary hyperparathyroidism    s/p  parathyroidectomy right inferior on 12-02-2012   Humerus fracture 04/25/2022   Hyperlipidemia    Hypertension    followd by nephrologist----  (09-20-2019  per pt had stress test done some time ago , unsure when/ where, thinks told ok)   OA (osteoarthritis)    Osteoporosis    Prosthetic joint infection (HCC)    followed by dr j. hatcher ([place])   left total shoulder arthroplasty 12/ 2016  ; 12/ 2020  revision with explant joint replacement and hemiarthroplasty;  completed 6 month antibiotic 05/ 2021   Vertigo     SURGICAL HISTORY: Past Surgical History:  Procedure Laterality Date   COLONOSCOPY     DILATATION & CURETTAGE/HYSTEROSCOPY WITH MYOSURE N/A 09/28/2019   Procedure: DILATATION & CURETTAGE/HYSTEROSCOPY WITH MYOSURE;  Surgeon: Olivia Mackie, MD;  Location: [facility] [place];  Service: Gynecology;  Laterality: N/A;   EYE SURGERY  yrs ago   laser surgery for retinal tear.  (unilateral , pt unsure which side)   ORIF HUMERUS FRACTURE Right 04/27/2022   Procedure: OPEN REDUCTION INTERNAL FIXATION (ORIF) PROXIMAL HUMERUS FRACTURE;  Surgeon: Bjorn Pippin, MD;  Location: [facility];  Service: Orthopedics;  Laterality: Right;   PARATHYROIDECTOMY N/A 12/02/2012   Procedure: PARATHYROIDECTOMY with frozen section ;  Surgeon: Velora Heckler, MD;  Location: [facility];  Service:  [facility]  Telephone:([phone]) [phone]   HEMATOLOGY ONCOLOGY INPATIENT CONSULTATION   Tina Frank  DOB: 03-09-1941  MR#: [id number]  CSN#: [id number]    Requesting Physician: [facility]  Patient Care Team: Sigmund Hazel, MD as PCP - General (Family Medicine) Glyn Ade, PA-C as Physician Assistant (Dermatology)  Reason for consult: Hypercalcemia, (+) M protein   History of present illness:   82 year old female with past medical history of hyperparathyroidism status post inferior parathyroidectomy in 2014, diabetes, hypertension, chronic CHF, CVA in August 2024, who was admitted on December 22, 2022 for altered mental status, felt to be related to UTI, new onset hypercalcemia.  She also had a mild AKI on admission, which has resolved after hydration.  Her lab work also showed positive monoclonal protein in blood, 0.7, and slightly elevated light chain level.  Due to the persistent hypercalcemia and concerns for MM, I was called to evaluate patient.  Patient used to live independently at home, her house has gone down significantly since her mechanical fall which resulted right humeral shaft and proximal humerus fractures.  She underwent surgery and went to SNF after that.  She was admitted again on October 12, 2022 for dysphagia, which felt to be related to her stroke.  She was again sent to nursing home after hospital stay.  She was readmitted on December 22, 2022 for altered mental status, refused to eat etc. her mental status has recovered poorly, but she is still very weak.  She has lost about 20 pounds in the past several months.  She denies any significant pain except the pain from right shoulder, she also reports constipation, but no hematochezia or nausea.  MEDICAL HISTORY:  Past Medical History:  Diagnosis Date   CKD (chronic kidney disease), stage II    nephrologist-- dr Kathrene Bongo  [facility])   Diabetes mellitus type 2, diet-controlled (HCC)     followed by pcp---   (09-20-2019  per pt does not check blood sugar at home)   Fracture of humeral shaft, right, closed 04/25/2022   GERD (gastroesophageal reflux disease)    History of primary hyperparathyroidism    s/p  parathyroidectomy right inferior on 12-02-2012   Humerus fracture 04/25/2022   Hyperlipidemia    Hypertension    followd by nephrologist----  (09-20-2019  per pt had stress test done some time ago , unsure when/ where, thinks told ok)   OA (osteoarthritis)    Osteoporosis    Prosthetic joint infection (HCC)    followed by dr j. hatcher ([place])   left total shoulder arthroplasty 12/ 2016  ; 12/ 2020  revision with explant joint replacement and hemiarthroplasty;  completed 6 month antibiotic 05/ 2021   Vertigo     SURGICAL HISTORY: Past Surgical History:  Procedure Laterality Date   COLONOSCOPY     DILATATION & CURETTAGE/HYSTEROSCOPY WITH MYOSURE N/A 09/28/2019   Procedure: DILATATION & CURETTAGE/HYSTEROSCOPY WITH MYOSURE;  Surgeon: Olivia Mackie, MD;  Location: [facility] [place];  Service: Gynecology;  Laterality: N/A;   EYE SURGERY  yrs ago   laser surgery for retinal tear.  (unilateral , pt unsure which side)   ORIF HUMERUS FRACTURE Right 04/27/2022   Procedure: OPEN REDUCTION INTERNAL FIXATION (ORIF) PROXIMAL HUMERUS FRACTURE;  Surgeon: Bjorn Pippin, MD;  Location: [facility];  Service: Orthopedics;  Laterality: Right;   PARATHYROIDECTOMY N/A 12/02/2012   Procedure: PARATHYROIDECTOMY with frozen section ;  Surgeon: Velora Heckler, MD;  Location: [facility];  Service:

## 2023-01-02 NOTE — Progress Notes (Signed)
Tina Frank Progress Note    Assessment/ Plan:   Moderate Hypercalcemia, improving -h/o hyperparathyroidism s/p rt inferior parathyroidectomy 11/2012. Work up so far: PTH 14 (appropriately suppressed), calcitriol levels low, vit d 25 WNL. TSH WNL. Etiology presumed to be secondary to high cal and vit d intake. Home calcium and vitamin D supplements on hold -abnormal SPEP and FLC ratio -UPEP, PTHrP pending -Cal down to 11.1 today -c/w fluids: NS @125cc /hr. Will hold off on calcitonin and/or bisphosphonates. If calcium and mentation worsens then will certainly consider expanding therapy -Will need outpatient follow up with endocrinology. Hold calcium and vitamin D supplements on discharge  Abnormal SPEP -m-spike up to 0.4 (0.2 in August), FLC ratio 2.6, kappa dominant. Discussed with primary service and oncology to consult, appreciate assistance -upep pending   AKI -baseline seems to be normal. Likely related to hypercalcemia. Fluids as above. AKI resolved -Avoid nephrotoxic medications including NSAIDs and iodinated intravenous contrast exposure unless the latter is absolutely indicated.  Preferred narcotic agents for pain control are hydromorphone, fentanyl, and methadone. Morphine should not be used. Avoid Baclofen and avoid oral sodium phosphate and magnesium citrate based laxatives / bowel preps. Continue strict Input and Output monitoring. Will monitor the patient closely with you and intervene or adjust therapy as indicated by changes in clinical status/labs    Acute metabolic encephalopathy -presumed to be secondary to UTI and hyperclacemia. Possible component of sundowning/delirium?   E-coli UTI -s/p cefadroxil- per primary   New onset Afib -on xarelto, per primary   HTN -can titrate hydralazine accordingly if needed. Avoid thiazides  Hypokalemia -replete prn  Discussed with primary service and Dr. Mosetta Putt.  [name], MD  [facility]  Subjective:   Patient seen and examined bedside. Uop charted ~2.2L Patient denies any complaints. Denies any chest pain, SOB, swelling.    Objective:   BP (!) 176/58 (BP Location: Right Arm)   Pulse (!) 51   Temp 98.1 F (36.7 C) (Oral)   Resp 16   Ht 5\' 5"  (1.651 m)   Wt 58.9 kg   SpO2 95%   BMI 21.61 kg/m   Intake/Output Summary (Last 24 hours) at 01/02/2023 1324 Last data filed at 01/02/2023 0930 Gross per 24 hour  Intake 1617.12 ml  Output 2950 ml  Net -1332.88 ml   Weight change: 3.2 kg  Physical Exam: Gen: NAD, sitting up in bed CVS: RRR Resp: normal wob, unlabored Abd: soft Ext: no edema Neuro: alert, awake, hard of hearing  Imaging: No results found.  Labs: BMET Recent Labs  Lab 12/28/22 1256 12/29/22 0400 12/30/22 0433 12/31/22 0409 01/01/23 0416 01/02/23 0413  NA 134* 133* 137 136 136 136  K 3.8 3.6 3.7 3.1* 3.1* 3.1*  CL 101 102 107 110 109 111  CO2 26 24 22  21* 21* 22  GLUCOSE 114* 117* 98 105* 102* 101*  BUN 15 20 23 17 15 12   CREATININE 0.84 1.13* 0.92 0.90 0.67 0.63  CALCIUM 12.5* 12.3* 12.2* 11.7* 11.2* 11.1*  PHOS  --   --   --   --  2.7  --    CBC Recent Labs  Lab 12/28/22 1256 12/29/22 0400 12/30/22 0433 12/31/22 0409 01/01/23 0416 01/02/23 0413  WBC 9.0   < > 8.3 9.8 8.4 7.8  NEUTROABS 6.3  --   --   --   --   --   HGB 11.3*   < > 10.2* 10.2* 9.3* 9.2*  HCT 34.5*   < >  31.9* 31.7* 28.9* 28.7*  MCV 93.0   < > 94.4 94.1 94.4 93.2  PLT 230   < > 197 230 209 206   < > = values in this interval not displayed.    Medications:     aspirin EC  81 mg Oral Daily   feeding supplement  237 mL Oral BID BM   hydrALAZINE  75 mg Oral Q8H   insulin aspart  0-15 Units Subcutaneous TID WC   mouth rinse  15 mL Mouth Rinse 4 times per day   pantoprazole  40 mg Oral Daily   Rivaroxaban  15 mg Oral Q supper   sodium chloride flush  3 mL Intravenous Q12H      [name], MD [facility] 01/02/2023,  1:24 PM

## 2023-01-02 NOTE — Progress Notes (Addendum)
PROGRESS NOTE    Tina Frank  WGN:[id number] DOB: Aug 30, 1940 DOA: 12/22/2022 PCP: Sigmund Hazel, MD    Chief Complaint  Patient presents with   Altered Mental Status    Brief Narrative:  Tina Frank is a 82 y.o. female with a history of diabetes mellitus type 2, hyperparathyroidism s/p parathyroidectomy, primary hypertension, chronic diastolic heart failure, CVA . Patient presented secondary to worsening confusion and refusal to eat, drink or take medication and found to have acute metabolic encephalopathy secondary to UTI and complicated by associated AKI and hypercalcemia.    Assessment & Plan:   Principal Problem:   Acute encephalopathy Active Problems:   Hypercalcemia   UTI (urinary tract infection)   AKI (acute kidney injury) (HCC)   Atrial fibrillation (HCC)   Elevated troponin   Chronic heart failure with preserved ejection fraction (HFpEF) (HCC)  #1 acute metabolic encephalopathy -Felt presumed secondary to UTI in the setting of hypercalcemia. -Patient noted to have some improvement with mental status however some concerns for waxing and waning as he became quite confused again on 12/28/2022.??  Sundowning. -Improving slowly clinically. -TSH 1.499, free T41.46, vitamin B12 of 362, vitamin B1 pending.  Ammonia level < 10. -MRI brain with no acute intracranial process, no evidence of acute or subacute infarct noted. -EEG done with mild to moderate diffuse encephalopathy, no seizures or epileptiform discharges were seen throughout the recording. -Continue supportive care with IV fluids.  2.  Hypercalcemia/history of primary hyperparathyroidism status post parathyroidectomy in 2014 -??  Etiology of hypercalcemia. -May be secondary to exogenous calcium from home calcium and vitamin D supplementation. -PTH at 14 appropriately suppressed, calcitriol levels noted low, vitamin D 25 hydroxy within normal limits.  TSH within normal limits. -PTH RP pending, UPEP  pending. -Kappa lambda light chain ratio elevated at 2.64. -SPEP with 0.4% M spike which was 0.2% 2 months ago. -Patient seen in consultation by nephrology, placed on IV fluids with calcium level slowly trending down. -Corrected calcium at 12.48. -Nephrology recommending holding off on calcitonin and oral bisphosphonates at this time. -Nephrology recommended input from hematology/oncology and as such hematology/oncology consulted. -Per hematology positive M protein spike has slightly elevated serum kappa light chain levels are not impressive and suspicion for multiple myeloma per hematology not extremely high. -Hematology oncology recommending bone marrow biopsy for further evaluation and also recommending CT chest abdomen and pelvis with contrast due to malignancy being on differential. -Iron studies also ordered by hematology/oncology. -Will need outpatient follow-up with endocrinology. -Per hematology/oncology if patient still with persistent hypercalcemia in the next days may need to consider 1 dose of Zometa 4 mg. -Hematology/oncology, nephrology following and appreciate their input and recommendations.  3.  AKI -Felt likely secondary to a prerenal azotemia in the setting of dehydration and hypercalcemia. -Renal function improved with hydration and currently resolved. -Nephrology following.  4.  Hypokalemia/hypomagnesemia -Potassium at 3.1 this morning, magnesium at 1.8.   -Potassium packet 40 mEq p.o. every 4 hours x 2 doses.   -Magnesium sulfate 2 g IV x 1.   -Repeat labs in AM.  5.  E. coli UTI -Was on IV Rocephin, transitioned to [facility] and completed 7-day course of treatment. -No further antibiotics needed.  6.  New onset A-fib -2D echo from 10/13/2022 with a EF of 60 to 65%, grade 1 DD and mildly dilated left atrium. -Patient with history of bradycardia arrhythmia for which she was sent to cardiology in May 2024. -Patient felt not a great candidate for long-term  anticoagulation but fall risk now is lower due to debility per son. -Patient placed on Xarelto which is preferred medication per family.  7.  Elevated troponin -Currently asymptomatic. -Prior physician spoke with cardiology who reviewed the chart and felt no concerns for ACS.  Patient currently asymptomatic. -2D echo with normal EF, no significant valvular abnormality.  8.  Hypertension -Patient noted to have been on clonidine, hydralazine in the outpatient setting which were held on admission. -BP still elevated and as such we will increase hydralazine to 75 mg 3 times daily.   -Was on clonidine as needed prior to admission.  9.  Well-controlled, diet controlled diabetes mellitus type 2 -Hemoglobin A1c 6.0 (10/13/2022) -CBG 95 this morning. -SSI.  10.?  Vertigo -Patient stated on 01/01/2023, whenever she turns her head has a brief episode of spinning sensation which subsequently resolves on its own. -Patient does endorse a prior history of vertigo. -Clinical improvement. -Meclizine as needed.       DVT prophylaxis: Continue Xarelto Code Status: DNR Family Communication: Updated patient, updated son on telephone. Disposition: SNF when medically stable.  Status is: Inpatient Remains inpatient appropriate because: Severity of illness   Consultants:  Nephrology: Dr. Thedore Mins 12/29/2022 Hematology/oncology: Dr. Mosetta Putt 01/02/2023  Procedures:  Chest x-ray 12/22/2022 MRI brain 12/28/2022 2D echo 12/24/2022 EEG 12/29/2022  Antimicrobials:  Anti-infectives (From admission, onward)    Start     Dose/Rate Route Frequency Ordered Stop   12/26/22 0000  cefadroxil (DURICEF) 500 MG capsule        500 mg Oral 2 times daily 12/26/22 0938 12/30/22 2359   12/25/22 1000  cefadroxil (DURICEF) capsule 500 mg        500 mg Oral 2 times daily 12/25/22 0845 12/29/22 2240   12/23/22 1800  cefTRIAXone (ROCEPHIN) 2 g in sodium chloride 0.9 % 100 mL IVPB  Status:  Discontinued        2 g 200  mL/hr over 30 Minutes Intravenous Every 24 hours 12/22/22 2127 12/25/22 0845   12/22/22 1800  cefTRIAXone (ROCEPHIN) 2 g in sodium chloride 0.9 % 100 mL IVPB        2 g 200 mL/hr over 30 Minutes Intravenous  Once 12/22/22 1756 12/22/22 1950         Subjective: Patient sitting up in bed.  Denies any chest pain or shortness of breath.  No abdominal pain.  States dizziness with head turning has improved since yesterday.  Denies any overt bleeding.  Objective: Vitals:   01/01/23 1357 01/01/23 2045 01/02/23 0537 01/02/23 0539  BP: (!) 155/76 (!) 168/61  (!) 176/58  Pulse: (!) 56 66  (!) 51  Resp: 17 20  16   Temp: 98.5 F (36.9 C) 98.2 F (36.8 C)  98.1 F (36.7 C)  TempSrc: Oral Oral  Oral  SpO2: 98% 100%  95%  Weight:   58.9 kg   Height:        Intake/Output Summary (Last 24 hours) at 01/02/2023 1632 Last data filed at 01/02/2023 1600 Gross per 24 hour  Intake 2249.77 ml  Output 3800 ml  Net -1550.23 ml   Filed Weights   12/31/22 0500 01/01/23 0455 01/02/23 0537  Weight: 55.8 kg 55.7 kg 58.9 kg    Examination:  General exam: NAD Respiratory system: CTAB.  No wheezes, no crackles, no rhonchi.  Fair air movement.  Speaking in full sentences.  Cardiovascular system: RRR no murmurs rubs or gallops.  No JVD.  No pitting lower extremity edema.  Gastrointestinal system:  Abdomen is soft, nontender, nondistended, positive bowel sounds.  No rebound.  No guarding.    Central nervous system: Alert and oriented.  Moving extremities spontaneously.  No focal neurological deficits. Extremities: Symmetric 5 x 5 power. Skin: No rashes, lesions or ulcers Psychiatry: Judgement and insight appear normal. Mood & affect appropriate.     Data Reviewed: I have personally reviewed following labs and imaging studies  CBC: Recent Labs  Lab 12/28/22 1256 12/29/22 0400 12/30/22 0433 12/31/22 0409 01/01/23 0416 01/02/23 0413  WBC 9.0 8.6 8.3 9.8 8.4 7.8  NEUTROABS 6.3  --   --   --   --    --   HGB 11.3* 10.4* 10.2* 10.2* 9.3* 9.2*  HCT 34.5* 32.2* 31.9* 31.7* 28.9* 28.7*  MCV 93.0 92.5 94.4 94.1 94.4 93.2  PLT 230 219 197 230 209 206    Basic Metabolic Panel: Recent Labs  Lab 12/29/22 0400 12/30/22 0433 12/31/22 0407 12/31/22 0409 01/01/23 0416 01/02/23 0413  NA 133* 137  --  136 136 136  K 3.6 3.7  --  3.1* 3.1* 3.1*  CL 102 107  --  110 109 111  CO2 24 22  --  21* 21* 22  GLUCOSE 117* 98  --  105* 102* 101*  BUN 20 23  --  17 15 12   CREATININE 1.13* 0.92  --  0.90 0.67 0.63  CALCIUM 12.3* 12.2*  --  11.7* 11.2* 11.1*  MG  --   --  1.6*  --  2.1 1.8  PHOS  --   --   --   --  2.7  --     GFR: Estimated Creatinine Clearance: 47.1 mL/min (by C-G formula based on SCr of 0.63 mg/dL).  Liver Function Tests: Recent Labs  Lab 12/28/22 1256 01/01/23 0416  AST 17  --   ALT 14  --   ALKPHOS 85  --   BILITOT 0.8  --   PROT 6.5  --   ALBUMIN 3.0* 2.4*    CBG: Recent Labs  Lab 01/01/23 1703 01/01/23 2039 01/02/23 0742 01/02/23 1130 01/02/23 1631  GLUCAP 111* 111* 95 122* 101*     No results found for this or any previous visit (from the past 240 hour(s)).        Radiology Studies: No results found.      Scheduled Meds:  aspirin EC  81 mg Oral Daily   feeding supplement  237 mL Oral BID BM   hydrALAZINE  75 mg Oral Q8H   insulin aspart  0-15 Units Subcutaneous TID WC   mouth rinse  15 mL Mouth Rinse 4 times per day   pantoprazole  40 mg Oral Daily   Rivaroxaban  15 mg Oral Q supper   sodium chloride flush  3 mL Intravenous Q12H   Continuous Infusions:  sodium chloride 125 mL/hr at 01/02/23 1559     LOS: 11 days    Time spent: 35 minutes    Ramiro Harvest, MD [facility]   To contact the attending provider between 7A-7P or the covering provider during after hours 7P-7A, please log into the web site [email or web address] and access using universal [place] password for that web site. If you do not have the password,  please call the hospital operator.  01/02/2023, 4:32 PM

## 2023-01-02 NOTE — Plan of Care (Signed)
Discussed with patient plan of care for the evening, pain management and medications with some teach back displayed.  What is important to the patient is frequent visits and seeing staff to decrease her use for the call bell.  Patient is incontinent of urine and stool at this time with her confusion decreasing.  Problem: Education: Goal: Knowledge of the prescribed therapeutic regimen will improve Outcome: Not Progressing   Problem: Activity: Goal: Ability to tolerate increased activity will improve Outcome: Not Progressing   Problem: Pain Management: Goal: Pain level will decrease with appropriate interventions Outcome: Progressing

## 2023-01-03 ENCOUNTER — Inpatient Hospital Stay (HOSPITAL_COMMUNITY): Payer: Medicare PPO

## 2023-01-03 ENCOUNTER — Encounter (HOSPITAL_COMMUNITY): Payer: Self-pay | Admitting: Family Medicine

## 2023-01-03 DIAGNOSIS — N179 Acute kidney failure, unspecified: Secondary | ICD-10-CM | POA: Diagnosis not present

## 2023-01-03 DIAGNOSIS — I4891 Unspecified atrial fibrillation: Secondary | ICD-10-CM | POA: Diagnosis not present

## 2023-01-03 DIAGNOSIS — G9340 Encephalopathy, unspecified: Secondary | ICD-10-CM | POA: Diagnosis not present

## 2023-01-03 LAB — RENAL FUNCTION PANEL
Albumin: 2.5 g/dL — ABNORMAL LOW (ref 3.5–5.0)
Anion gap: 5 (ref 5–15)
BUN: 10 mg/dL (ref 8–23)
CO2: 21 mmol/L — ABNORMAL LOW (ref 22–32)
Calcium: 10.7 mg/dL — ABNORMAL HIGH (ref 8.9–10.3)
Chloride: 106 mmol/L (ref 98–111)
Creatinine, Ser: 0.64 mg/dL (ref 0.44–1.00)
GFR, Estimated: >60 mL/min (ref >60)
Glucose, Bld: 104 mg/dL — ABNORMAL HIGH (ref 70–99)
Phosphorus: 2.8 mg/dL (ref 2.5–4.6)
Potassium: 3.3 mmol/L — ABNORMAL LOW (ref 3.5–5.1)
Sodium: 132 mmol/L — ABNORMAL LOW (ref 135–145)

## 2023-01-03 LAB — RETIC PANEL
Immature Retic Fract: 7.1 % (ref 2.3–15.9)
RBC.: 3.21 MIL/uL — ABNORMAL LOW (ref 3.87–5.11)
Retic Count, Absolute: 45.9 10*3/uL (ref 19.0–186.0)
Retic Ct Pct: 1.4 % (ref 0.4–3.1)
Reticulocyte Hemoglobin: 33 pg (ref >27.9)

## 2023-01-03 LAB — CBC WITH DIFFERENTIAL/PLATELET
Abs Immature Granulocytes: 0.03 10*3/uL (ref 0.00–0.07)
Basophils Absolute: 0.1 10*3/uL (ref 0.0–0.1)
Basophils Relative: 1 %
Eosinophils Absolute: 0.3 10*3/uL (ref 0.0–0.5)
Eosinophils Relative: 4 %
HCT: 30.4 % — ABNORMAL LOW (ref 36.0–46.0)
Hemoglobin: 9.6 g/dL — ABNORMAL LOW (ref 12.0–15.0)
Immature Granulocytes: 0 %
Lymphocytes Relative: 11 %
Lymphs Abs: 0.9 10*3/uL (ref 0.7–4.0)
MCH: 29.6 pg (ref 26.0–34.0)
MCHC: 31.6 g/dL (ref 30.0–36.0)
MCV: 93.8 fL (ref 80.0–100.0)
Monocytes Absolute: 0.6 10*3/uL (ref 0.1–1.0)
Monocytes Relative: 8 %
Neutro Abs: 6.4 10*3/uL (ref 1.7–7.7)
Neutrophils Relative %: 76 %
Platelets: 228 10*3/uL (ref 150–400)
RBC: 3.24 MIL/uL — ABNORMAL LOW (ref 3.87–5.11)
RDW: 14.8 % (ref 11.5–15.5)
WBC: 8.4 10*3/uL (ref 4.0–10.5)
nRBC: 0 % (ref 0.0–0.2)

## 2023-01-03 LAB — FERRITIN: Ferritin: 76 ng/mL (ref 11–307)

## 2023-01-03 LAB — GLUCOSE, CAPILLARY
Glucose-Capillary: 106 mg/dL — ABNORMAL HIGH (ref 70–99)
Glucose-Capillary: 111 mg/dL — ABNORMAL HIGH (ref 70–99)
Glucose-Capillary: 126 mg/dL — ABNORMAL HIGH (ref 70–99)
Glucose-Capillary: 119 mg/dL — ABNORMAL HIGH (ref 70–99)

## 2023-01-03 LAB — MAGNESIUM: Magnesium: 1.9 mg/dL (ref 1.7–2.4)

## 2023-01-03 LAB — IRON AND TIBC
Iron: 30 ug/dL (ref 28–170)
Saturation Ratios: 14 % (ref 10.4–31.8)
TIBC: 210 ug/dL — ABNORMAL LOW (ref 250–450)
UIBC: 180 ug/dL

## 2023-01-03 LAB — FOLATE: Folate: 11.7 ng/mL (ref >5.9)

## 2023-01-03 MED ORDER — HYDRALAZINE HCL 20 MG/ML IJ SOLN
10.0000 mg | Freq: Once | INTRAMUSCULAR | Status: AC
  Administered 2023-01-03: 10 mg via INTRAVENOUS
  Filled 2023-01-03: qty 1

## 2023-01-03 MED ORDER — SODIUM CHLORIDE 0.9 % IV SOLN: INTRAVENOUS | Status: DC

## 2023-01-03 MED ORDER — IOHEXOL 300 MG/ML  SOLN
100.0000 mL | Freq: Once | INTRAMUSCULAR | Status: AC | PRN
  Administered 2023-01-03: 100 mL via INTRAVENOUS

## 2023-01-03 MED ORDER — ENOXAPARIN SODIUM 60 MG/0.6ML IJ SOSY
1.0000 mg/kg | PREFILLED_SYRINGE | Freq: Two times a day (BID) | INTRAMUSCULAR | Status: DC
  Administered 2023-01-03 – 2023-01-06 (×6): 60 mg via SUBCUTANEOUS
  Filled 2023-01-03 (×6): qty 0.6

## 2023-01-03 MED ORDER — ALLOPURINOL 300 MG PO TABS
300.0000 mg | ORAL_TABLET | Freq: Every day | ORAL | Status: DC
  Administered 2023-01-03 – 2023-01-08 (×6): 300 mg via ORAL
  Filled 2023-01-03 (×6): qty 1

## 2023-01-03 MED ORDER — IOHEXOL 9 MG/ML PO SOLN
ORAL | Status: AC
  Filled 2023-01-03: qty 1000

## 2023-01-03 MED ORDER — HYDRALAZINE HCL 50 MG PO TABS
100.0000 mg | ORAL_TABLET | Freq: Three times a day (TID) | ORAL | Status: DC
  Administered 2023-01-03 – 2023-01-08 (×17): 100 mg via ORAL
  Filled 2023-01-03 (×17): qty 2

## 2023-01-03 MED ORDER — MAGNESIUM OXIDE -MG SUPPLEMENT 400 (240 MG) MG PO TABS
400.0000 mg | ORAL_TABLET | Freq: Every day | ORAL | Status: DC
  Administered 2023-01-03 – 2023-01-08 (×6): 400 mg via ORAL
  Filled 2023-01-03 (×6): qty 1

## 2023-01-03 MED ORDER — CLONIDINE HCL 0.1 MG PO TABS: 0.1000 mg | ORAL_TABLET | Freq: Every day | ORAL | Status: DC | PRN

## 2023-01-03 MED ORDER — SENNOSIDES-DOCUSATE SODIUM 8.6-50 MG PO TABS: 1.0000 | ORAL_TABLET | Freq: Every evening | ORAL | Status: DC | PRN

## 2023-01-03 MED ORDER — POTASSIUM CHLORIDE 20 MEQ PO PACK
40.0000 meq | PACK | Freq: Once | ORAL | Status: AC
  Administered 2023-01-03: 40 meq via ORAL
  Filled 2023-01-03: qty 2

## 2023-01-03 MED ORDER — FUROSEMIDE 10 MG/ML IJ SOLN
20.0000 mg | Freq: Once | INTRAMUSCULAR | Status: AC
  Administered 2023-01-03: 20 mg via INTRAVENOUS
  Filled 2023-01-03: qty 2

## 2023-01-03 NOTE — Progress Notes (Signed)
PHARMACY - ANTICOAGULATION CONSULT NOTE  Pharmacy Consult for enoxaparin Indication: atrial fibrillation (Xarelto on hold)  Allergies  Allergen Reactions   Abaloparatide Other (See Comments)    "Burred vision, lightheaded" (patient does not recall this in 2024)   Mobic [Meloxicam] Hypertension   Crestor [Rosuvastatin] Other (See Comments)    Muscle aches   Norvasc [Amlodipine Besylate] Swelling and Other (See Comments)    Ankle swelling   Simvastatin Other (See Comments)    Muscle aches  Other Reaction(s): high LFTs   Sulfa Antibiotics Rash   Zetia [Ezetimibe] Other (See Comments)    Muscle aches    Patient Measurements: Height: 5\' 5"  (165.1 cm) Weight: 60.5 kg (133 lb 4.8 oz) IBW/kg (Calculated) : 57  Vital Signs: Temp: 98.5 F (36.9 C) (10/26 1538) Temp Source: Oral (10/26 1538) BP: 129/68 (10/26 1538) Pulse Rate: 85 (10/26 1538)  Labs: Recent Labs    01/01/23 0416 01/02/23 0413 01/03/23 0440  HGB 9.3* 9.2* 9.6*  HCT 28.9* 28.7* 30.4*  PLT 209 206 228  CREATININE 0.67 0.63 0.64    Estimated Creatinine Clearance: 47.1 mL/min (by C-G formula based on SCr of 0.64 mg/dL).   Medical History: Past Medical History:  Diagnosis Date   CKD (chronic kidney disease), stage II    nephrologist-- dr Kathrene Bongo  [facility])   Diabetes mellitus type 2, diet-controlled (HCC)    followed by pcp---   (09-20-2019  per pt does not check blood sugar at home)   Fracture of humeral shaft, right, closed 04/25/2022   GERD (gastroesophageal reflux disease)    History of primary hyperparathyroidism    s/p  parathyroidectomy right inferior on 12-02-2012   Humerus fracture 04/25/2022   Hyperlipidemia    Hypertension    followd by nephrologist----  (09-20-2019  per pt had stress test done some time ago , unsure when/ where, thinks told ok)   OA (osteoarthritis)    Osteoporosis    Prosthetic joint infection (HCC)    followed by dr j. hatcher (ID)   left total shoulder  arthroplasty 12/ 2016  ; 12/ 2020  revision with explant joint replacement and hemiarthroplasty;  completed 6 month antibiotic 05/ 2021   Vertigo     Assessment: Pt is an 78 yoF who is currently on rivaroxaban for new onset atrial fibrillation. Noted hx CVA 10/2022. Anticoagulation being transitioned to enoxaparin in anticipation for possible bone marrow biopsy/procedures.   AKI has resolved. Nephrology following.  Last dose of rivaroxaban 10/25 @ 15:56  Today, 01/03/23 CBC: Hgb low but stable, Plt WNL SCr 0.64 Total body weight 60.5 kg  Goal of Therapy:  Anti-Xa level (4 hours after dose): 0.6 - 1 units/mL Monitor platelets by anticoagulation protocol: Yes   Plan:  Discontinue rivaroxaban Initiate enoxaparin 60 mg (1 mg/kg) subQ q12h Monitor CBC, renal function Monitor for signs of bleeding Follow for eventual transition back to rivaroxaban  Cindi Carbon, PharmD 01/03/2023,4:48 PM

## 2023-01-03 NOTE — Progress Notes (Signed)
Patient has had SBP >170 and as high as 190's throughout this shift.  She has been taking her scheduled Hydralazine every 8 hrs and a one-time dose of 10 mg IV.  She may need her catapres 0.1 mg PO for SBP>160 as needed restarted from her PTA med list.

## 2023-01-03 NOTE — Progress Notes (Signed)
PROGRESS NOTE    Tina Frank  ZOX:[id number] DOB: Feb 16, 1941 DOA: 12/22/2022 PCP: Sigmund Hazel, MD    Chief Complaint  Patient presents with   Altered Mental Status    Brief Narrative:  Tina Frank is a 82 y.o. female with a history of diabetes mellitus type 2, hyperparathyroidism s/p parathyroidectomy, primary hypertension, chronic diastolic heart failure, CVA . Patient presented secondary to worsening confusion and refusal to eat, drink or take medication and found to have acute metabolic encephalopathy secondary to UTI and complicated by associated AKI and hypercalcemia.    Assessment & Plan:   Principal Problem:   Acute encephalopathy Active Problems:   Hypercalcemia   UTI (urinary tract infection)   AKI (acute kidney injury) (HCC)   Atrial fibrillation (HCC)   Elevated troponin   Chronic heart failure with preserved ejection fraction (HFpEF) (HCC)  #1 acute metabolic encephalopathy -Felt presumed secondary to UTI in the setting of hypercalcemia. -Patient noted to have some improvement with mental status however some concerns for waxing and waning as he became quite confused again on 12/28/2022.??  Sundowning. -Improving slowly clinically. -TSH 1.499, free T41.46, vitamin B12 of 362, vitamin B1 100.6.  Ammonia level < 10. -MRI brain with no acute intracranial process, no evidence of acute or subacute infarct noted. -EEG done with mild to moderate diffuse encephalopathy, no seizures or epileptiform discharges were seen throughout the recording. -Continue supportive care with IV fluids.  2.  Hypercalcemia/history of primary hyperparathyroidism status post parathyroidectomy in 2014 -??  Etiology of hypercalcemia. -May be secondary to exogenous calcium from home calcium and vitamin D supplementation. -PTH at 14 appropriately suppressed, calcitriol levels noted low, vitamin D 25 hydroxy within normal limits.  TSH within normal limits. -PTH RP pending, UPEP  pending. -Kappa lambda light chain ratio elevated at 2.64. -SPEP with 0.4% M spike which was 0.2% 2 months ago. -Patient seen in consultation by nephrology, placed on IV fluids with calcium level slowly trending down. -Corrected calcium at 11.9. -Nephrology recommending holding off on calcitonin and oral bisphosphonates at this time. -Nephrology recommended input from hematology/oncology and as such hematology/oncology consulted. -Per hematology positive M protein spike has slightly elevated serum kappa light chain levels are not impressive and suspicion for multiple myeloma per hematology not extremely high. -Hematology oncology recommending bone marrow biopsy for further evaluation and also recommending CT chest abdomen and pelvis with contrast due to malignancy being on differential. -CT chest abdomen and pelvis done this morning. -Iron studies also ordered by hematology/oncology with a iron level of 30, TIBC of 210, ferritin of 76, folate of 11.7,.  Vitamin B12 of 362. -Multiple myeloma panel pending. -Will need outpatient follow-up with endocrinology. -Per hematology/oncology if patient still with persistent hypercalcemia in the next days may need to consider 1 dose of Zometa 4 mg. -Hematology/oncology, nephrology following and appreciate their input and recommendations.  3.  AKI -Felt likely secondary to a prerenal azotemia in the setting of dehydration and hypercalcemia. -Renal function improved with hydration and currently resolved. -IV fluids decreased to 50 cc/h per nephrology. -Patient to receive Lasix 20 mg IV x 1 today, follow renal function. -Nephrology following.  4.  Hypokalemia/hypomagnesemia -Potassium at 3.3 this morning, magnesium at 1.9.   -Potassium packet 40 mEq p.o. x 1. -Start magnesium oxide 400 mg p.o. twice daily. -Repeat labs in AM.  5.  E. coli UTI -Was on IV Rocephin, transitioned to [facility] and completed 7-day course of treatment. -No further antibiotics  needed.  6.  New onset A-fib -2D echo from 10/13/2022 with a EF of 60 to 65%, grade 1 DD and mildly dilated left atrium. -Patient with history of bradycardia arrhythmia for which she was sent to cardiology in May 2024. -Patient felt not a great candidate for long-term anticoagulation but fall risk now is lower due to debility per son. -Patient placed on Xarelto which is preferred medication per family. -Will place patient on full dose Lovenox for now pending possible bone marrow biopsy per hematology recommendations.  7.  Elevated troponin -Currently asymptomatic. -Prior physician spoke with cardiology who reviewed the chart and felt no concerns for ACS.  Patient currently asymptomatic. -2D echo with normal EF, no significant valvular abnormality.  8.  Hypertension -Patient noted to have been on clonidine, hydralazine in the outpatient setting which were held on admission. -BP still elevated and as such we will increase hydralazine to 100 mg 3 times daily.   -Resume home regimen as needed clonidine. -Will give a dose of Lasix 20 mg IV x 1 today.  9.  Well-controlled, diet controlled diabetes mellitus type 2 -Hemoglobin A1c 6.0 (10/13/2022) -CBG 106 this morning. -SSI.  10.?  Vertigo -Patient stated on 01/01/2023, whenever she turns her head has a brief episode of spinning sensation which subsequently resolves on its own. -Patient does endorse a prior history of vertigo. -Clinical improvement. -Meclizine as needed.       DVT prophylaxis: Continue Xarelto>>>.  Lovenox Code Status: DNR Family Communication: Updated patient, no family at bedside.  Disposition: SNF when medically stable.  Status is: Inpatient Remains inpatient appropriate because: Severity of illness   Consultants:  Nephrology: Dr. Thedore Mins 12/29/2022 Hematology/oncology: Dr. Mosetta Putt 01/02/2023  Procedures:  Chest x-ray 12/22/2022 MRI brain 12/28/2022 2D echo 12/24/2022 EEG 12/29/2022 CT abdomen and pelvis  01/03/2023  Antimicrobials:  Anti-infectives (From admission, onward)    Start     Dose/Rate Route Frequency Ordered Stop   12/26/22 0000  cefadroxil (DURICEF) 500 MG capsule        500 mg Oral 2 times daily 12/26/22 0938 12/30/22 2359   12/25/22 1000  cefadroxil (DURICEF) capsule 500 mg        500 mg Oral 2 times daily 12/25/22 0845 12/29/22 2240   12/23/22 1800  cefTRIAXone (ROCEPHIN) 2 g in sodium chloride 0.9 % 100 mL IVPB  Status:  Discontinued        2 g 200 mL/hr over 30 Minutes Intravenous Every 24 hours 12/22/22 2127 12/25/22 0845   12/22/22 1800  cefTRIAXone (ROCEPHIN) 2 g in sodium chloride 0.9 % 100 mL IVPB        2 g 200 mL/hr over 30 Minutes Intravenous  Once 12/22/22 1756 12/22/22 1950         Subjective: Patient sleeping but easily arousable.  Denies any chest pain or shortness of breath.  Denies any abdominal pain.  Stated she has had a CT scan of her abdomen done this morning.  Objective: Vitals:   01/02/23 2028 01/03/23 0232 01/03/23 0500 01/03/23 0545  BP: (!) 193/86 (!) 180/62  (!) 170/76  Pulse: 72 65  64  Resp:  15    Temp:  98.5 F (36.9 C)    TempSrc:  Oral    SpO2:  94%    Weight:   60.5 kg   Height:        Intake/Output Summary (Last 24 hours) at 01/03/2023 1247 Last data filed at 01/03/2023 1208 Gross per 24 hour  Intake 3620.22 ml  Output 2800  ml  Net 820.22 ml   Filed Weights   01/01/23 0455 01/02/23 0537 01/03/23 0500  Weight: 55.7 kg 58.9 kg 60.5 kg    Examination:  General exam: NAD Respiratory system: Lungs clear to auscultation bilaterally anterior lung fields.  No wheezes, no crackles, no rhonchi.  Fair air movement.  Speaking in full sentences.   Cardiovascular system: Regular rate rhythm no murmurs rubs or gallops.  No JVD.  No lower extremity edema.  Gastrointestinal system: Abdomen is soft, nontender, nondistended, positive bowel sounds.  No rebound.  No guarding.  Central nervous system: Alert and oriented.  Moving  extremities spontaneously.  No focal neurological deficits. Extremities: Symmetric 5 x 5 power. Skin: No rashes, lesions or ulcers Psychiatry: Judgement and insight appear normal. Mood & affect appropriate.     Data Reviewed: I have personally reviewed following labs and imaging studies  CBC: Recent Labs  Lab 12/28/22 1256 12/29/22 0400 12/30/22 0433 12/31/22 0409 01/01/23 0416 01/02/23 0413 01/03/23 0440  WBC 9.0   < > 8.3 9.8 8.4 7.8 8.4  NEUTROABS 6.3  --   --   --   --   --  6.4  HGB 11.3*   < > 10.2* 10.2* 9.3* 9.2* 9.6*  HCT 34.5*   < > 31.9* 31.7* 28.9* 28.7* 30.4*  MCV 93.0   < > 94.4 94.1 94.4 93.2 93.8  PLT 230   < > 197 230 209 206 228   < > = values in this interval not displayed.    Basic Metabolic Panel: Recent Labs  Lab 12/30/22 0433 12/31/22 0407 12/31/22 0409 01/01/23 0416 01/02/23 0413 01/03/23 0440  NA 137  --  136 136 136 132*  K 3.7  --  3.1* 3.1* 3.1* 3.3*  CL 107  --  110 109 111 106  CO2 22  --  21* 21* 22 21*  GLUCOSE 98  --  105* 102* 101* 104*  BUN 23  --  17 15 12 10   CREATININE 0.92  --  0.90 0.67 0.63 0.64  CALCIUM 12.2*  --  11.7* 11.2* 11.1* 10.7*  MG  --  1.6*  --  2.1 1.8 1.9  PHOS  --   --   --  2.7  --  2.8    GFR: Estimated Creatinine Clearance: 47.1 mL/min (by C-G formula based on SCr of 0.64 mg/dL).  Liver Function Tests: Recent Labs  Lab 12/28/22 1256 01/01/23 0416 01/03/23 0440  AST 17  --   --   ALT 14  --   --   ALKPHOS 85  --   --   BILITOT 0.8  --   --   PROT 6.5  --   --   ALBUMIN 3.0* 2.4* 2.5*    CBG: Recent Labs  Lab 01/02/23 1130 01/02/23 1631 01/02/23 2057 01/03/23 0805 01/03/23 1207  GLUCAP 122* 101* 108* 106* 111*     No results found for this or any previous visit (from the past 240 hour(s)).        Radiology Studies: No results found.      Scheduled Meds:  allopurinol  300 mg Oral Daily   aspirin EC  81 mg Oral Daily   feeding supplement  237 mL Oral BID BM    hydrALAZINE  100 mg Oral Q8H   insulin aspart  0-15 Units Subcutaneous TID WC   magnesium oxide  400 mg Oral Daily   mouth rinse  15 mL Mouth Rinse 4 times per  day   pantoprazole  40 mg Oral Daily   Rivaroxaban  15 mg Oral Q supper   sodium chloride flush  3 mL Intravenous Q12H   Continuous Infusions:     LOS: 12 days    Time spent: 35 minutes    Ramiro Harvest, MD [facility]   To contact the attending provider between 7A-7P or the covering provider during after hours 7P-7A, please log into the web site [email or web address] and access using universal [place] password for that web site. If you do not have the password, please call the hospital operator.  01/03/2023, 12:47 PM

## 2023-01-03 NOTE — Progress Notes (Signed)
[facility] Progress Note    Assessment/ Plan:   Moderate Hypercalcemia, improving -h/o hyperparathyroidism s/p rt inferior parathyroidectomy 11/2012. Work up so far: PTH 14 (appropriately suppressed), calcitriol levels low, vit d 25 WNL. TSH WNL, PTHrP low. Etiology presumed to be secondary to high cal and vit d intake. Home calcium and vitamin D supplements on hold -abnormal SPEP and FLC ratio -UPEP pending -Cal down to 10.7 today (Cca ~11.9). BP is up now but still remains euvolemic on exam suprisingly. I am going to decrease her fluid rate to 50cc/hr. She did receive a dose of lasix today. Monitoring to see if she rebounds tomorrow. If so, then will give a dose of zometa +/- inc IVF rate -Will need outpatient follow up with endocrinology. Hold calcium and vitamin D supplements on discharge  Abnormal SPEP -m-spike up to 0.4 (0.2 in August), FLC ratio 2.6, kappa dominant. Oncology following, appreciate assistance -upep pending -ct c/a/p w/ contrast pending (okay from my end)   AKI -baseline seems to be normal. Likely related to hypercalcemia. Fluids as above. AKI resolved -Avoid nephrotoxic medications including NSAIDs and iodinated intravenous contrast exposure unless the latter is absolutely indicated.  Preferred narcotic agents for pain control are hydromorphone, fentanyl, and methadone. Morphine should not be used. Avoid Baclofen and avoid oral sodium phosphate and magnesium citrate based laxatives / bowel preps. Continue strict Input and Output monitoring. Will monitor the patient closely with you and intervene or adjust therapy as indicated by changes in clinical status/labs    Acute metabolic encephalopathy -presumed to be secondary to UTI and hyperclacemia. Possible component of sundowning/delirium?   E-coli UTI -s/p cefadroxil- per primary   New onset Afib -on xarelto, per primary   HTN -can titrate hydralazine accordingly if needed. Avoid thiazides. S/p  lasix today. Decreasing IVF rate as above  Hypokalemia -replete prn  Discussed with primary service.  [name], MD [facility]  Subjective:   Patient seen and examined bedside. Uop charted ~2.2L w/ one unmeasured void Patient denies any complaints. Denies any chest pain, SOB, swelling. Is eating a bit more today per RN    Objective:   BP (!) 170/76 (BP Location: Left Arm) Comment: sch'd BP med given  Pulse 64   Temp 98.5 F (36.9 C) (Oral)   Resp 15   Ht 5\' 5"  (1.651 m)   Wt 60.5 kg   SpO2 94%   BMI 22.18 kg/m   Intake/Output Summary (Last 24 hours) at 01/03/2023 1244 Last data filed at 01/03/2023 1208 Gross per 24 hour  Intake 3620.22 ml  Output 2800 ml  Net 820.22 ml   Weight change: 1.564 kg  Physical Exam: Gen: NAD, sitting up in bed CVS: RRR Resp: normal wob, unlabored Abd: soft Ext: no edema Neuro: alert, awake, hard of hearing  Imaging: No results found.  Labs: BMET Recent Labs  Lab 12/28/22 1256 12/29/22 0400 12/30/22 0433 12/31/22 0409 01/01/23 0416 01/02/23 0413 01/03/23 0440  NA 134* 133* 137 136 136 136 132*  K 3.8 3.6 3.7 3.1* 3.1* 3.1* 3.3*  CL 101 102 107 110 109 111 106  CO2 26 24 22  21* 21* 22 21*  GLUCOSE 114* 117* 98 105* 102* 101* 104*  BUN 15 20 23 17 15 12 10   CREATININE 0.84 1.13* 0.92 0.90 0.67 0.63 0.64  CALCIUM 12.5* 12.3* 12.2* 11.7* 11.2* 11.1* 10.7*  PHOS  --   --   --   --  2.7  --  2.8  CBC Recent Labs  Lab 12/28/22 1256 12/29/22 0400 12/31/22 0409 01/01/23 0416 01/02/23 0413 01/03/23 0440  WBC 9.0   < > 9.8 8.4 7.8 8.4  NEUTROABS 6.3  --   --   --   --  6.4  HGB 11.3*   < > 10.2* 9.3* 9.2* 9.6*  HCT 34.5*   < > 31.7* 28.9* 28.7* 30.4*  MCV 93.0   < > 94.1 94.4 93.2 93.8  PLT 230   < > 230 209 206 228   < > = values in this interval not displayed.    Medications:     allopurinol  300 mg Oral Daily   aspirin EC  81 mg Oral Daily   feeding supplement  237 mL Oral BID BM    hydrALAZINE  100 mg Oral Q8H   insulin aspart  0-15 Units Subcutaneous TID WC   magnesium oxide  400 mg Oral Daily   mouth rinse  15 mL Mouth Rinse 4 times per day   pantoprazole  40 mg Oral Daily   Rivaroxaban  15 mg Oral Q supper   sodium chloride flush  3 mL Intravenous Q12H      [name], MD [facility] 01/03/2023, 12:44 PM

## 2023-01-04 ENCOUNTER — Encounter (HOSPITAL_COMMUNITY): Payer: Self-pay | Admitting: Family Medicine

## 2023-01-04 DIAGNOSIS — I4891 Unspecified atrial fibrillation: Secondary | ICD-10-CM | POA: Diagnosis not present

## 2023-01-04 DIAGNOSIS — D7589 Other specified diseases of blood and blood-forming organs: Secondary | ICD-10-CM | POA: Diagnosis not present

## 2023-01-04 DIAGNOSIS — N1330 Unspecified hydronephrosis: Secondary | ICD-10-CM

## 2023-01-04 DIAGNOSIS — R338 Other retention of urine: Secondary | ICD-10-CM

## 2023-01-04 DIAGNOSIS — N2889 Other specified disorders of kidney and ureter: Secondary | ICD-10-CM

## 2023-01-04 DIAGNOSIS — N179 Acute kidney failure, unspecified: Secondary | ICD-10-CM | POA: Diagnosis not present

## 2023-01-04 DIAGNOSIS — G9340 Encephalopathy, unspecified: Secondary | ICD-10-CM | POA: Diagnosis not present

## 2023-01-04 DIAGNOSIS — Z87898 Personal history of other specified conditions: Secondary | ICD-10-CM

## 2023-01-04 DIAGNOSIS — D649 Anemia, unspecified: Secondary | ICD-10-CM | POA: Diagnosis not present

## 2023-01-04 LAB — RENAL FUNCTION PANEL
Albumin: 2.6 g/dL — ABNORMAL LOW (ref 3.5–5.0)
Anion gap: 6 (ref 5–15)
BUN: 19 mg/dL (ref 8–23)
CO2: 23 mmol/L (ref 22–32)
Calcium: 11.5 mg/dL — ABNORMAL HIGH (ref 8.9–10.3)
Chloride: 108 mmol/L (ref 98–111)
Creatinine, Ser: 0.72 mg/dL (ref 0.44–1.00)
GFR, Estimated: >60 mL/min (ref >60)
Glucose, Bld: 100 mg/dL — ABNORMAL HIGH (ref 70–99)
Phosphorus: 3.4 mg/dL (ref 2.5–4.6)
Potassium: 3.5 mmol/L (ref 3.5–5.1)
Sodium: 137 mmol/L (ref 135–145)

## 2023-01-04 LAB — CBC
HCT: 28 % — ABNORMAL LOW (ref 36.0–46.0)
Hemoglobin: 9.1 g/dL — ABNORMAL LOW (ref 12.0–15.0)
MCH: 30.5 pg (ref 26.0–34.0)
MCHC: 32.5 g/dL (ref 30.0–36.0)
MCV: 94 fL (ref 80.0–100.0)
Platelets: 235 10*3/uL (ref 150–400)
RBC: 2.98 MIL/uL — ABNORMAL LOW (ref 3.87–5.11)
RDW: 15 % (ref 11.5–15.5)
WBC: 7.2 10*3/uL (ref 4.0–10.5)
nRBC: 0 % (ref 0.0–0.2)

## 2023-01-04 LAB — GLUCOSE, CAPILLARY
Glucose-Capillary: 120 mg/dL — ABNORMAL HIGH (ref 70–99)
Glucose-Capillary: 157 mg/dL — ABNORMAL HIGH (ref 70–99)
Glucose-Capillary: 115 mg/dL — ABNORMAL HIGH (ref 70–99)
Glucose-Capillary: 134 mg/dL — ABNORMAL HIGH (ref 70–99)

## 2023-01-04 LAB — PTH-RELATED PEPTIDE: PTH-related peptide: <2 pmol/L

## 2023-01-04 LAB — MAGNESIUM: Magnesium: 1.8 mg/dL (ref 1.7–2.4)

## 2023-01-04 MED ORDER — POTASSIUM CHLORIDE 20 MEQ PO PACK
40.0000 meq | PACK | Freq: Once | ORAL | Status: AC
  Administered 2023-01-04: 40 meq via ORAL
  Filled 2023-01-04: qty 2

## 2023-01-04 MED ORDER — TAMSULOSIN HCL 0.4 MG PO CAPS
0.4000 mg | ORAL_CAPSULE | Freq: Every day | ORAL | Status: DC
  Administered 2023-01-04 – 2023-01-08 (×5): 0.4 mg via ORAL
  Filled 2023-01-04 (×6): qty 1

## 2023-01-04 MED ORDER — SODIUM CHLORIDE 0.9 % IV SOLN: INTRAVENOUS | Status: AC

## 2023-01-04 MED ORDER — ZOLEDRONIC ACID 4 MG/100ML IV SOLN
4.0000 mg | Freq: Once | INTRAVENOUS | Status: AC
  Administered 2023-01-04: 4 mg via INTRAVENOUS
  Filled 2023-01-04: qty 100

## 2023-01-04 NOTE — Plan of Care (Signed)
  Problem: Activity: Goal: Ability to tolerate increased activity will improve Outcome: Progressing   Problem: Pain Management: Goal: Pain level will decrease with appropriate interventions Outcome: Progressing   Problem: Elimination: Goal: Will not experience complications related to urinary retention Outcome: Not Progressing

## 2023-01-04 NOTE — Progress Notes (Signed)
Bladder scan done at 1800 showing 280 ml. Pt was able to void 150 ml in purewick after. Will continue to monitor.

## 2023-01-04 NOTE — Progress Notes (Signed)
Bladder scan was done and read .  In & Out cath PRN was done . 650 urine was removed.  Bed pad also was wet and emptied canister earlier ( ).

## 2023-01-04 NOTE — Progress Notes (Signed)
Tina Frank   DOB:November 03, 1940   WU#:[id number]   GNF#:[id number]  Hem/onc follow up note   Subjective: Patient was eating lunch when I saw her, she was awake and alert.  Son at bedside.  No new complaints.   Objective:  Vitals:   01/03/23 2219 01/04/23 0539  BP: 137/89 (!) 151/68  Pulse: 66 64  Resp:  16  Temp:  98.4 F (36.9 C)  SpO2:  97%    Body mass index is 21.63 kg/m.  Intake/Output Summary (Last 24 hours) at 01/04/2023 1302 Last data filed at 01/04/2023 8469 Gross per 24 hour  Intake 2799.6 ml  Output 1650 ml  Net 1149.6 ml     Sclerae unicteric  MSK no focal spinal tenderness, no peripheral edema  Neuro nonfocal    CBG (last 3)  Recent Labs    01/03/23 2014 01/04/23 0800 01/04/23 1218  GLUCAP 119* 120* 157*     Labs:  Lab Results  Component Value Date   WBC 7.2 01/04/2023   HGB 9.1 (L) 01/04/2023   HCT 28.0 (L) 01/04/2023   MCV 94.0 01/04/2023   PLT 235 01/04/2023   NEUTROABS 6.4 01/03/2023    @LASTCHEMISTRY @  Urine Studies No results for input(s): "UHGB", "CRYS" in the last 72 hours.  Invalid input(s): "UACOL", "UAPR", "USPG", "UPH", "UTP", "UGL", "UKET", "UBIL", "UNIT", "UROB", "ULEU", "UEPI", "UWBC", "URBC", "UBAC", "CAST", "UCOM", "BILUA"  Basic Metabolic Panel: Recent Labs  Lab 12/31/22 0407 12/31/22 0409 01/01/23 0416 01/02/23 0413 01/03/23 0440 01/04/23 0447  NA  --  136 136 136 132* 137  K  --  3.1* 3.1* 3.1* 3.3* 3.5  CL  --  110 109 111 106 108  CO2  --  21* 21* 22 21* 23  GLUCOSE  --  105* 102* 101* 104* 100*  BUN  --  17 15 12 10 19   CREATININE  --  0.90 0.67 0.63 0.64 0.72  CALCIUM  --  11.7* 11.2* 11.1* 10.7* 11.5*  MG 1.6*  --  2.1 1.8 1.9 1.8  PHOS  --   --  2.7  --  2.8 3.4   GFR Estimated Creatinine Clearance: 47.1 mL/min (by C-G formula based on SCr of 0.72 mg/dL). Liver Function Tests: Recent Labs  Lab 01/01/23 0416 01/03/23 0440 01/04/23 0447  ALBUMIN 2.4* 2.5* 2.6*   No results for input(s):  "LIPASE", "AMYLASE" in the last 168 hours. No results for input(s): "AMMONIA" in the last 168 hours. Coagulation profile No results for input(s): "INR", "PROTIME" in the last 168 hours.  CBC: Recent Labs  Lab 12/31/22 0409 01/01/23 0416 01/02/23 0413 01/03/23 0440 01/04/23 0447  WBC 9.8 8.4 7.8 8.4 7.2  NEUTROABS  --   --   --  6.4  --   HGB 10.2* 9.3* 9.2* 9.6* 9.1*  HCT 31.7* 28.9* 28.7* 30.4* 28.0*  MCV 94.1 94.4 93.2 93.8 94.0  PLT 230 209 206 228 235   Cardiac Enzymes: No results for input(s): "CKTOTAL", "CKMB", "CKMBINDEX", "TROPONINI" in the last 168 hours. BNP: Invalid input(s): "POCBNP" CBG: Recent Labs  Lab 01/03/23 1207 01/03/23 1608 01/03/23 2014 01/04/23 0800 01/04/23 1218  GLUCAP 111* 126* 119* 120* 157*   D-Dimer No results for input(s): "DDIMER" in the last 72 hours. Hgb A1c No results for input(s): "HGBA1C" in the last 72 hours. Lipid Profile No results for input(s): "CHOL", "HDL", "LDLCALC", "TRIG", "CHOLHDL", "LDLDIRECT" in the last 72 hours. Thyroid function studies No results for input(s): "TSH", "T4TOTAL", "T3FREE", "THYROIDAB" in the  last 72 hours.  Invalid input(s): "FREET3" Anemia work up Recent Labs    01/03/23 0440  FOLATE 11.7  FERRITIN 76  TIBC 210*  IRON 30  RETICCTPCT 1.4   Microbiology No results found for this or any previous visit (from the past 240 hour(s)).    Studies:  CT CHEST ABDOMEN PELVIS W CONTRAST  Result Date: 01/03/2023 CLINICAL DATA:  Hypercalcemia, weight loss, rule out malignancy * Tracking Code: BO * EXAM: CT CHEST, ABDOMEN, AND PELVIS WITH CONTRAST TECHNIQUE: Multidetector CT imaging of the chest, abdomen and pelvis was performed following the standard protocol during bolus administration of intravenous contrast. RADIATION DOSE REDUCTION: This exam was performed according to the departmental dose-optimization program which includes automated exposure control, adjustment of the mA and/or kV according to  patient size and/or use of iterative reconstruction technique. CONTRAST:  OMNIPAQUE IOHEXOL 300 MG/ML  SOLN COMPARISON:  CT abdomen pelvis, 06/21/2019 FINDINGS: CT CHEST FINDINGS Cardiovascular: Aortic atherosclerosis. Cardiomegaly. Three-vessel coronary artery calcifications. No pericardial effusion. Mediastinum/Nodes: No enlarged mediastinal, hilar, or axillary lymph nodes. Thyroid gland, trachea, and esophagus demonstrate no significant findings. Lungs/Pleura: Bibasilar scarring or atelectasis. No pleural effusion or pneumothorax. Musculoskeletal: No chest wall abnormality. No acute osseous findings. Status post bilateral shoulder arthroplasty. CT ABDOMEN PELVIS FINDINGS Hepatobiliary: No solid liver abnormality is seen. Layering sludge in the gallbladder. No discrete gallstones. No gallbladder wall thickening, or biliary dilatation. Pancreas: Unremarkable. No pancreatic ductal dilatation or surrounding inflammatory changes. Spleen: Normal in size without significant abnormality. Adrenals/Urinary Tract: Benign non nodular adenomatous thickening of the adrenal glands. Mild bilateral hydronephrosis and hydroureter to the ureterovesicular junctions, without calculus or other obstruction visualized. Distended urinary bladder, measuring up to 15.1 cm. Numerous bilateral renal lesions. The majority of these are simple appearing, fluid attenuation cysts, however there is a soft tissue attenuation lesion arising from the superior pole of the left kidney measuring 1.9 x 1.6 cm (series 3, image 69). Additional complex appearing cyst of the inferior pole of the right kidney with both fluid and soft tissue attenuation components, measuring 3.8 x 3.3 cm (series 3, image 73) and of the lateral midportion of the left kidney measuring 1.9 x 1.3 cm (series 3, image 67). Stomach/Bowel: Stomach is within normal limits. Appendix appears normal. No evidence of bowel wall thickening, distention, or inflammatory changes.  Descending and sigmoid diverticulosis. Vascular/Lymphatic: Severe aortic atherosclerosis. No enlarged abdominal or pelvic lymph nodes. Reproductive: No mass or other abnormality. Other: No abdominal wall hernia or abnormality. No ascites. Musculoskeletal: No acute osseous findings. Status post left hip total arthroplasty. IMPRESSION: 1. No definite evidence of malignancy or metastatic disease in the chest, abdomen, or pelvis. 2. Soft tissue attenuation lesion arising from the superior pole of the left kidney measuring 1.9 x 1.6 cm concerning for solid renal mass. Additional complex appearing cysts of the inferior pole of the right kidney and lateral midportion of the left kidney. Recommend multiphasic contrast enhanced CT or MRI for further characterization, preferably on a nonemergent, outpatient basis, when appropriate. 3. Mild bilateral hydronephrosis and hydroureter to the ureterovesicular junctions, without calculus or other obstruction visualized. Distended urinary bladder, measuring up to 15.1 cm. Findings are consistent with urinary retention and hydronephrosis secondary to back pressure. 4. Cardiomegaly and coronary artery disease. Aortic Atherosclerosis (ICD10-I70.0). Electronically Signed   By: Jearld Lesch M.D.   On: 01/03/2023 15:07    Assessment: 82 y.o. female with past medical history of CHF, diabetes, fall and her right humeral fracture in February 2024, stroke  in August 2024, now presenting with hypocalcemia   Metabolic encephalopathy secondary to AKI, hypercalcemia and UTI, resolved. Persistent hypercalcemia, with low PTH, unclear etiology AKI second to hypocalcemia, resolved Normocytic anemia Plasma dyscrasia, rule out multiple myeloma    Plan:  -I reviewed her CT scan findings with patient and her son, there is no evidence of malignancy or metastatic disease.  There is a incidental finding of a 1.9 x 1.6 cm lesion in the left kidney, concerning for renal malignancy.  This is  unlikely as a cause of her hypercalcemia.  I will probably obtain abdominal MRI in the future for further for follow up, will consider IR referral in outpatient setting. -Will proceed with bone marrow biopsy to rule out multiple myeloma, this is scheduled for tomorrow.  Spoke with patient and her son they are in agreement with the plan. -I recommend biphosphonate Zometa 4 mg once for her hypocalcemia, this has been ordered by Dr. Thedore Mins -Okay to discharge after pulmonary biopsy, I will follow-up her biopsy results in office -Plan discussed with Dr. Dr. Vernona Rieger, MD 01/04/2023  1:02 PM

## 2023-01-04 NOTE — Progress Notes (Signed)
PROGRESS NOTE    Tina Frank  HYQ:[id number] DOB: 1940-11-14 DOA: 12/22/2022 PCP: Sigmund Hazel, MD    Chief Complaint  Patient presents with   Altered Mental Status    Brief Narrative:  Tina Frank is a 82 y.o. female with a history of diabetes mellitus type 2, hyperparathyroidism s/p parathyroidectomy, primary hypertension, chronic diastolic heart failure, CVA . Patient presented secondary to worsening confusion and refusal to eat, drink or take medication and found to have acute metabolic encephalopathy secondary to UTI and complicated by associated AKI and hypercalcemia.    Assessment & Plan:   Principal Problem:   Acute encephalopathy Active Problems:   Hypercalcemia   UTI (urinary tract infection)   AKI (acute kidney injury) (HCC)   Atrial fibrillation (HCC)   Elevated troponin   Chronic heart failure with preserved ejection fraction (HFpEF) (HCC)   Renal mass   Bilateral hydronephrosis   Acute urinary retention  #1 acute metabolic encephalopathy -Felt presumed secondary to UTI in the setting of hypercalcemia. -Patient noted to have some improvement with mental status however some concerns for waxing and waning as he became quite confused again on 12/28/2022.??  Sundowning. -Improving slowly clinically. -TSH 1.499, free T41.46, vitamin B12 of 362, vitamin B1 100.6.  Ammonia level < 10. -MRI brain with no acute intracranial process, no evidence of acute or subacute infarct noted. -EEG done with mild to moderate diffuse encephalopathy, no seizures or epileptiform discharges were seen throughout the recording. -Continue supportive care with IV fluids.  2.  Hypercalcemia/history of primary hyperparathyroidism status post parathyroidectomy in 2014 -??  Etiology of hypercalcemia. -May be secondary to exogenous calcium from home calcium and vitamin D supplementation. -PTH at 14 appropriately suppressed, calcitriol levels noted low, vitamin D 25 hydroxy within normal  limits.  TSH within normal limits. -PTH RP pending, UPEP pending. -Kappa lambda light chain ratio elevated at 2.64. -SPEP with 0.4% M spike which was 0.2% 2 months ago. -Patient seen in consultation by nephrology, placed on IV fluids with calcium level slowly trending down. -Corrected calcium at 12.62. -Nephrology recommending holding off on calcitonin and oral bisphosphonates early on in the hospitalization.  -Nephrology recommended input from hematology/oncology and as such hematology/oncology consulted. -Per hematology positive M protein spike has slightly elevated serum kappa light chain levels are not impressive and suspicion for multiple myeloma per hematology not extremely high. -Hematology oncology recommending bone marrow biopsy for further evaluation which will be done per IR tomorrow 01/05/2023.   -CT abdomen and pelvis recommended by hematology/oncology negative for malignancy or metastatic disease however does note 1.9 x 1.6 cm lesion in the left kidney concerning for renal malignancy.  -Iron studies also ordered by hematology/oncology with a iron level of 30, TIBC of 210, ferritin of 76, folate of 11.7,.  Vitamin B12 of 362. -Multiple myeloma panel pending. -Nephrology in agreement with hematology for 1 dose of Zometa 4 mg which has been ordered per nephrology. -Will need outpatient follow-up with endocrinology. -Hematology/oncology, nephrology following and appreciate their input and recommendations.  3.  AKI -Felt likely secondary to a prerenal azotemia in the setting of dehydration and hypercalcemia. -Renal function improved with hydration and currently resolved. -IV fluids increased back to 125 cc/h per nephrology today. -Nephrology following.  4.  Hypokalemia/hypomagnesemia -Potassium at 3.5 this morning, magnesium at 1.8.   -Continue mag oxide 400 mg daily.   -Potassium packet 40 mEq x 1.  -Repeat labs in AM.  5.  E. coli UTI -Was on IV  Rocephin, transitioned to  [facility] and completed 7-day course of treatment. -No further antibiotics needed.  6.  New onset A-fib -2D echo from 10/13/2022 with a EF of 60 to 65%, grade 1 DD and mildly dilated left atrium. -Patient with history of bradycardia arrhythmia for which she was sent to cardiology in May 2024. -Patient felt not a great candidate for long-term anticoagulation but fall risk now is lower due to debility per son. -Patient placed on Xarelto which is preferred medication per family. -Patient has been placed on full dose Lovenox for now pending bone marrow biopsy per hematology recommendations to be done by IR tomorrow 01/05/2023..  7.  Elevated troponin -Currently asymptomatic. -Prior physician spoke with cardiology who reviewed the chart and felt no concerns for ACS.  Patient currently asymptomatic. -2D echo with normal EF, no significant valvular abnormality.  8.  Hypertension -Patient noted to have been on clonidine, hydralazine in the outpatient setting which were held on admission. -BP still elevated and as such hydralazine increased to 100 mg 3 times daily. -Status post Lasix 20 mg IV x 1 on 01/03/2023. -Continue home regimen as needed clonidine.  9.  Well-controlled, diet controlled diabetes mellitus type 2 -Hemoglobin A1c 6.0 (10/13/2022) -CBG 120 this morning. -SSI.  10.  Renal mass/hydronephrosis/urinary retention -Noted on CT abdomen and pelvis. -1.9 x 1.6 cm lesion in the left kidney noted concerning for renal cell malignancy.  CT also concerning for mild bilateral hydronephrosis and hydroureter.  To the uterovesical junctions without calculus or other obstruction visualized.  Distended urinary bladder measuring up to 15.1 cm findings consistent with urinary retention and hydronephrosis secondary to back pressure. -Patient noted with a urine output of 2.9 L over the past 24 hours. -Renal function currently stable. -Place on Flomax, serial bladder scans. -Hematology/oncology to  follow-up on lesion on left kidney in the outpatient setting and likely would obtain an MRI for further evaluation.  11.?  Vertigo -Patient stated on 01/01/2023, whenever she turns her head has a brief episode of spinning sensation which subsequently resolves on its own. -Patient does endorse a prior history of vertigo. -Clinical improvement. -Meclizine as needed.       DVT prophylaxis: Continue Xarelto>>>Lovenox pending bone marrow biopsy Code Status: DNR Family Communication: Updated patient, no family at bedside.  Disposition: SNF when medically stable.  Status is: Inpatient Remains inpatient appropriate because: Severity of illness   Consultants:  Nephrology: Dr. Thedore Mins 12/29/2022 Hematology/oncology: Dr. Mosetta Putt 01/02/2023  Procedures:  Chest x-ray 12/22/2022 MRI brain 12/28/2022 2D echo 12/24/2022 EEG 12/29/2022 CT abdomen and pelvis 01/03/2023  Antimicrobials:  Anti-infectives (From admission, onward)    Start     Dose/Rate Route Frequency Ordered Stop   12/26/22 0000  cefadroxil (DURICEF) 500 MG capsule        500 mg Oral 2 times daily 12/26/22 0938 12/30/22 2359   12/25/22 1000  cefadroxil (DURICEF) capsule 500 mg        500 mg Oral 2 times daily 12/25/22 0845 12/29/22 2240   12/23/22 1800  cefTRIAXone (ROCEPHIN) 2 g in sodium chloride 0.9 % 100 mL IVPB  Status:  Discontinued        2 g 200 mL/hr over 30 Minutes Intravenous Every 24 hours 12/22/22 2127 12/25/22 0845   12/22/22 1800  cefTRIAXone (ROCEPHIN) 2 g in sodium chloride 0.9 % 100 mL IVPB        2 g 200 mL/hr over 30 Minutes Intravenous  Once 12/22/22 1756 12/22/22 1950  Subjective: Patient sleeping deeply.  Bladder scan obtained overnight with 453 cc status post In-N-Out cath with 650 removed.  Patient with good urine output of 2.9 L over the past 24 hours.  Objective: Vitals:   01/03/23 2219 01/04/23 0500 01/04/23 0539 01/04/23 1329  BP: 137/89  (!) 151/68 (!) 135/59  Pulse: 66  64 63   Resp:   16 16  Temp:   98.4 F (36.9 C) 98.2 F (36.8 C)  TempSrc:   Oral Oral  SpO2:   97% 98%  Weight:  59 kg    Height:        Intake/Output Summary (Last 24 hours) at 01/04/2023 1617 Last data filed at 01/04/2023 1600 Gross per 24 hour  Intake 2142.04 ml  Output 1446 ml  Net 696.04 ml   Filed Weights   01/02/23 0537 01/03/23 0500 01/04/23 0500  Weight: 58.9 kg 60.5 kg 59 kg    Examination:  General exam: NAD Respiratory system: CTAB anterior lung fields.  No wheezes, no crackles, no rhonchi.  Fair air movement.  Speaking in full sentences.  Cardiovascular system: RRR no murmurs rubs or gallops.  No JVD.  No lower extremity edema.  Gastrointestinal system: Abdomen is soft, nontender, nondistended, positive bowel sounds.  No rebound.  No guarding. Central nervous system: Alert and oriented.  Moving extremities spontaneously.  No focal neurological deficits. Extremities: Symmetric 5 x 5 power. Skin: No rashes, lesions or ulcers Psychiatry: Judgement and insight appear normal. Mood & affect appropriate.     Data Reviewed: I have personally reviewed following labs and imaging studies  CBC: Recent Labs  Lab 12/31/22 0409 01/01/23 0416 01/02/23 0413 01/03/23 0440 01/04/23 0447  WBC 9.8 8.4 7.8 8.4 7.2  NEUTROABS  --   --   --  6.4  --   HGB 10.2* 9.3* 9.2* 9.6* 9.1*  HCT 31.7* 28.9* 28.7* 30.4* 28.0*  MCV 94.1 94.4 93.2 93.8 94.0  PLT 230 209 206 228 235    Basic Metabolic Panel: Recent Labs  Lab 12/31/22 0407 12/31/22 0409 01/01/23 0416 01/02/23 0413 01/03/23 0440 01/04/23 0447  NA  --  136 136 136 132* 137  K  --  3.1* 3.1* 3.1* 3.3* 3.5  CL  --  110 109 111 106 108  CO2  --  21* 21* 22 21* 23  GLUCOSE  --  105* 102* 101* 104* 100*  BUN  --  17 15 12 10 19   CREATININE  --  0.90 0.67 0.63 0.64 0.72  CALCIUM  --  11.7* 11.2* 11.1* 10.7* 11.5*  MG 1.6*  --  2.1 1.8 1.9 1.8  PHOS  --   --  2.7  --  2.8 3.4    GFR: Estimated Creatinine Clearance:  47.1 mL/min (by C-G formula based on SCr of 0.72 mg/dL).  Liver Function Tests: Recent Labs  Lab 01/01/23 0416 01/03/23 0440 01/04/23 0447  ALBUMIN 2.4* 2.5* 2.6*    CBG: Recent Labs  Lab 01/03/23 1608 01/03/23 2014 01/04/23 0800 01/04/23 1218 01/04/23 1540  GLUCAP 126* 119* 120* 157* 115*     No results found for this or any previous visit (from the past 240 hour(s)).        Radiology Studies: CT CHEST ABDOMEN PELVIS W CONTRAST  Result Date: 01/03/2023 CLINICAL DATA:  Hypercalcemia, weight loss, rule out malignancy * Tracking Code: BO * EXAM: CT CHEST, ABDOMEN, AND PELVIS WITH CONTRAST TECHNIQUE: Multidetector CT imaging of the chest, abdomen and pelvis was performed following  the standard protocol during bolus administration of intravenous contrast. RADIATION DOSE REDUCTION: This exam was performed according to the departmental dose-optimization program which includes automated exposure control, adjustment of the mA and/or kV according to patient size and/or use of iterative reconstruction technique. CONTRAST:  OMNIPAQUE IOHEXOL 300 MG/ML  SOLN COMPARISON:  CT abdomen pelvis, 06/21/2019 FINDINGS: CT CHEST FINDINGS Cardiovascular: Aortic atherosclerosis. Cardiomegaly. Three-vessel coronary artery calcifications. No pericardial effusion. Mediastinum/Nodes: No enlarged mediastinal, hilar, or axillary lymph nodes. Thyroid gland, trachea, and esophagus demonstrate no significant findings. Lungs/Pleura: Bibasilar scarring or atelectasis. No pleural effusion or pneumothorax. Musculoskeletal: No chest wall abnormality. No acute osseous findings. Status post bilateral shoulder arthroplasty. CT ABDOMEN PELVIS FINDINGS Hepatobiliary: No solid liver abnormality is seen. Layering sludge in the gallbladder. No discrete gallstones. No gallbladder wall thickening, or biliary dilatation. Pancreas: Unremarkable. No pancreatic ductal dilatation or surrounding inflammatory changes. Spleen:  Normal in size without significant abnormality. Adrenals/Urinary Tract: Benign non nodular adenomatous thickening of the adrenal glands. Mild bilateral hydronephrosis and hydroureter to the ureterovesicular junctions, without calculus or other obstruction visualized. Distended urinary bladder, measuring up to 15.1 cm. Numerous bilateral renal lesions. The majority of these are simple appearing, fluid attenuation cysts, however there is a soft tissue attenuation lesion arising from the superior pole of the left kidney measuring 1.9 x 1.6 cm (series 3, image 69). Additional complex appearing cyst of the inferior pole of the right kidney with both fluid and soft tissue attenuation components, measuring 3.8 x 3.3 cm (series 3, image 73) and of the lateral midportion of the left kidney measuring 1.9 x 1.3 cm (series 3, image 67). Stomach/Bowel: Stomach is within normal limits. Appendix appears normal. No evidence of bowel wall thickening, distention, or inflammatory changes. Descending and sigmoid diverticulosis. Vascular/Lymphatic: Severe aortic atherosclerosis. No enlarged abdominal or pelvic lymph nodes. Reproductive: No mass or other abnormality. Other: No abdominal wall hernia or abnormality. No ascites. Musculoskeletal: No acute osseous findings. Status post left hip total arthroplasty. IMPRESSION: 1. No definite evidence of malignancy or metastatic disease in the chest, abdomen, or pelvis. 2. Soft tissue attenuation lesion arising from the superior pole of the left kidney measuring 1.9 x 1.6 cm concerning for solid renal mass. Additional complex appearing cysts of the inferior pole of the right kidney and lateral midportion of the left kidney. Recommend multiphasic contrast enhanced CT or MRI for further characterization, preferably on a nonemergent, outpatient basis, when appropriate. 3. Mild bilateral hydronephrosis and hydroureter to the ureterovesicular junctions, without calculus or other obstruction  visualized. Distended urinary bladder, measuring up to 15.1 cm. Findings are consistent with urinary retention and hydronephrosis secondary to back pressure. 4. Cardiomegaly and coronary artery disease. Aortic Atherosclerosis (ICD10-I70.0). Electronically Signed   By: Jearld Lesch M.D.   On: 01/03/2023 15:07        Scheduled Meds:  allopurinol  300 mg Oral Daily   aspirin EC  81 mg Oral Daily   enoxaparin (LOVENOX) injection  1 mg/kg Subcutaneous Q12H   feeding supplement  237 mL Oral BID BM   hydrALAZINE  100 mg Oral Q8H   insulin aspart  0-15 Units Subcutaneous TID WC   magnesium oxide  400 mg Oral Daily   pantoprazole  40 mg Oral Daily   sodium chloride flush  3 mL Intravenous Q12H   tamsulosin  0.4 mg Oral Daily   Continuous Infusions:  sodium chloride 125 mL/hr at 01/04/23 1540      LOS: 13 days    Time spent:  35 minutes    Ramiro Harvest, MD [facility]   To contact the attending provider between 7A-7P or the covering provider during after hours 7P-7A, please log into the web site [email or web address] and access using universal  password for that web site. If you do not have the password, please call the hospital operator.  01/04/2023, 4:17 PM

## 2023-01-04 NOTE — Progress Notes (Signed)
Per IR MD Oley Balm, anticoagulation does not need to be held before bone marrow biopsy 01/05/23. OK to give scheduled PO ASA and SQ Lovenox.

## 2023-01-04 NOTE — Progress Notes (Signed)
[facility] Progress Note    Assessment/ Plan:   Hypercalcemia -h/o hyperparathyroidism s/p rt inferior parathyroidectomy 11/2012. Work up so far: PTH 14 (appropriately suppressed), calcitriol levels low, vit d 25 WNL. TSH WNL, PTHrP low (<2). Etiology presumed to be secondary to high cal and vit d intake. Home calcium and vitamin D supplements on hold -abnormal SPEP and FLC ratio. UPEP pending -decreased fluid rate yesterday however calcium rebounded back up-- Cca 12.6. Of note, received lasix yesterday. C/w fluids---increasing rate back to 125cc/hr x 1 day and then reassess -Zometa 4mg  IV x 1 dose today. Holding off on calcitonin but can certainly re-consider this in this next day or so if calcium doesn't budge much with increasing fluid rate -Will need outpatient follow up with endocrinology. Hold calcium and vitamin D supplements on discharge  Abnormal SPEP -m-spike up to 0.4 (0.2 in August), FLC ratio 2.6, kappa dominant. Oncology following, appreciate assistance -upep pending -ct c/a/p w/ contrast: without definitive evidence of malignancy/metastatic disease. Concern for solid left superior pole renal mass and additional complex cysts inferior pole of rt kidney and lateral left kidney. Mild bilateral hydronephrosis and hydroureter with distended bladder -open for bone marrow biopsy  Renal mass Hydronephrosis Urinary retention -oncology following, would follow reccs in regards to renal mass but may be beneficial to have her MRI done while she's here as opposed to outpatient. May need to involve urology +/- foley placement but not sure if her hydro is secondary to urinary retention at the time which she did have -would recommend initiation of flomax, serial bladder scans for today--discussed w/ primary   AKI -baseline seems to be normal. Likely related to hypercalcemia. Fluids as above. AKI resolved -Avoid nephrotoxic medications including NSAIDs and iodinated  intravenous contrast exposure unless the latter is absolutely indicated.  Preferred narcotic agents for pain control are hydromorphone, fentanyl, and methadone. Morphine should not be used. Avoid Baclofen and avoid oral sodium phosphate and magnesium citrate based laxatives / bowel preps. Continue strict Input and Output monitoring. Will monitor the patient closely with you and intervene or adjust therapy as indicated by changes in clinical status/labs    Acute metabolic encephalopathy -presumed to be secondary to UTI and hyperclacemia. Possible component of sundowning/delirium?   E-coli UTI -s/p cefadroxil- per primary   New onset Afib -on xarelto, per primary   HTN -fluctuant BP, watch for now. Consider amlodipine if consistently elevated despite off fluids -avoid thiazides  Hypokalemia -replete prn  Discussed with primary service.  [name], MD  [facility]  Subjective:   Patient seen and examined bedside. Bladder scan yesterday with 453cc, s/p I/O and 650 removed. UOP charted 2.9L Rather drowsy during my encounter but easily arousable    Objective:   BP (!) 151/68   Pulse 64   Temp 98.4 F (36.9 C) (Oral)   Resp 16   Ht 5\' 5"  (1.651 m)   Wt 59 kg   SpO2 97%   BMI 21.63 kg/m   Intake/Output Summary (Last 24 hours) at 01/04/2023 1051 Last data filed at 01/04/2023 1610 Gross per 24 hour  Intake 2799.6 ml  Output 2250 ml  Net 549.6 ml   Weight change: -1.497 kg  Physical Exam: Gen: NAD, sitting up in bed CVS: RRR Resp: normal wob, unlabored, cta b/l Abd: soft Ext: no edema Neuro: drowsy, wakes up to vocal stimuli but falls back asleep rather quickly, following commands, very hard of hearing  Imaging: CT CHEST ABDOMEN PELVIS W CONTRAST  Result Date: 01/03/2023 CLINICAL DATA:  Hypercalcemia, weight loss, rule out malignancy * Tracking Code: BO * EXAM: CT CHEST, ABDOMEN, AND PELVIS WITH CONTRAST TECHNIQUE: Multidetector CT imaging of the  chest, abdomen and pelvis was performed following the standard protocol during bolus administration of intravenous contrast. RADIATION DOSE REDUCTION: This exam was performed according to the departmental dose-optimization program which includes automated exposure control, adjustment of the mA and/or kV according to patient size and/or use of iterative reconstruction technique. CONTRAST:  OMNIPAQUE IOHEXOL 300 MG/ML  SOLN COMPARISON:  CT abdomen pelvis, 06/21/2019 FINDINGS: CT CHEST FINDINGS Cardiovascular: Aortic atherosclerosis. Cardiomegaly. Three-vessel coronary artery calcifications. No pericardial effusion. Mediastinum/Nodes: No enlarged mediastinal, hilar, or axillary lymph nodes. Thyroid gland, trachea, and esophagus demonstrate no significant findings. Lungs/Pleura: Bibasilar scarring or atelectasis. No pleural effusion or pneumothorax. Musculoskeletal: No chest wall abnormality. No acute osseous findings. Status post bilateral shoulder arthroplasty. CT ABDOMEN PELVIS FINDINGS Hepatobiliary: No solid liver abnormality is seen. Layering sludge in the gallbladder. No discrete gallstones. No gallbladder wall thickening, or biliary dilatation. Pancreas: Unremarkable. No pancreatic ductal dilatation or surrounding inflammatory changes. Spleen: Normal in size without significant abnormality. Adrenals/Urinary Tract: Benign non nodular adenomatous thickening of the adrenal glands. Mild bilateral hydronephrosis and hydroureter to the ureterovesicular junctions, without calculus or other obstruction visualized. Distended urinary bladder, measuring up to 15.1 cm. Numerous bilateral renal lesions. The majority of these are simple appearing, fluid attenuation cysts, however there is a soft tissue attenuation lesion arising from the superior pole of the left kidney measuring 1.9 x 1.6 cm (series 3, image 69). Additional complex appearing cyst of the inferior pole of the right kidney with both fluid and soft tissue  attenuation components, measuring 3.8 x 3.3 cm (series 3, image 73) and of the lateral midportion of the left kidney measuring 1.9 x 1.3 cm (series 3, image 67). Stomach/Bowel: Stomach is within normal limits. Appendix appears normal. No evidence of bowel wall thickening, distention, or inflammatory changes. Descending and sigmoid diverticulosis. Vascular/Lymphatic: Severe aortic atherosclerosis. No enlarged abdominal or pelvic lymph nodes. Reproductive: No mass or other abnormality. Other: No abdominal wall hernia or abnormality. No ascites. Musculoskeletal: No acute osseous findings. Status post left hip total arthroplasty. IMPRESSION: 1. No definite evidence of malignancy or metastatic disease in the chest, abdomen, or pelvis. 2. Soft tissue attenuation lesion arising from the superior pole of the left kidney measuring 1.9 x 1.6 cm concerning for solid renal mass. Additional complex appearing cysts of the inferior pole of the right kidney and lateral midportion of the left kidney. Recommend multiphasic contrast enhanced CT or MRI for further characterization, preferably on a nonemergent, outpatient basis, when appropriate. 3. Mild bilateral hydronephrosis and hydroureter to the ureterovesicular junctions, without calculus or other obstruction visualized. Distended urinary bladder, measuring up to 15.1 cm. Findings are consistent with urinary retention and hydronephrosis secondary to back pressure. 4. Cardiomegaly and coronary artery disease. Aortic Atherosclerosis (ICD10-I70.0). Electronically Signed   By: Jearld Lesch M.D.   On: 01/03/2023 15:07    Labs: BMET Recent Labs  Lab 12/29/22 0400 12/30/22 0433 12/31/22 0409 01/01/23 0416 01/02/23 0413 01/03/23 0440 01/04/23 0447  NA 133* 137 136 136 136 132* 137  K 3.6 3.7 3.1* 3.1* 3.1* 3.3* 3.5  CL 102 107 110 109 111 106 108  CO2 24 22 21* 21* 22 21* 23  GLUCOSE 117* 98 105* 102* 101* 104* 100*  BUN 20 23 17 15 12 10 19   CREATININE 1.13* 0.92  0.90 0.67 0.63  0.64 0.72  CALCIUM 12.3* 12.2* 11.7* 11.2* 11.1* 10.7* 11.5*  PHOS  --   --   --  2.7  --  2.8 3.4   CBC Recent Labs  Lab 12/28/22 1256 12/29/22 0400 01/01/23 0416 01/02/23 0413 01/03/23 0440 01/04/23 0447  WBC 9.0   < > 8.4 7.8 8.4 7.2  NEUTROABS 6.3  --   --   --  6.4  --   HGB 11.3*   < > 9.3* 9.2* 9.6* 9.1*  HCT 34.5*   < > 28.9* 28.7* 30.4* 28.0*  MCV 93.0   < > 94.4 93.2 93.8 94.0  PLT 230   < > 209 206 228 235   < > = values in this interval not displayed.    Medications:     allopurinol  300 mg Oral Daily   aspirin EC  81 mg Oral Daily   enoxaparin (LOVENOX) injection  1 mg/kg Subcutaneous Q12H   feeding supplement  237 mL Oral BID BM   hydrALAZINE  100 mg Oral Q8H   insulin aspart  0-15 Units Subcutaneous TID WC   magnesium oxide  400 mg Oral Daily   pantoprazole  40 mg Oral Daily   sodium chloride flush  3 mL Intravenous Q12H      [name], MD [facility] 01/04/2023, 10:51 AM

## 2023-01-04 NOTE — Consult Note (Signed)
Chief Complaint: Patient was seen in consultation today for rule out MM  Referring Physician(s): Dr. Mosetta Putt  Supervising Physician: Oley Balm  Patient Status: [facility] - In-pt  History of Present Illness: Tina Frank is a 82 y.o. female with CKD, DM2, GERD, HLD, who presented to [facility] 10/14 for AMS.  She was found to have UTI as well as hypercalcemia.  She was treated medically without improved ultimately prompting Heme/Onc consultation. M protein +.  Bone marrow biopsy requested.   Patient assessed at bedside.  She is alert, fail-appearing, with either mild confusion or limited insight into current medical status.  Able to state her immediate needs.  No family at bedside. Eating lunch with minimal amount consumed.   Past Medical History:  Diagnosis Date   CKD (chronic kidney disease), stage II    nephrologist-- dr Kathrene Bongo  [facility])   Diabetes mellitus type 2, diet-controlled (HCC)    followed by pcp---   (09-20-2019  per pt does not check blood sugar at home)   Fracture of humeral shaft, right, closed 04/25/2022   GERD (gastroesophageal reflux disease)    History of primary hyperparathyroidism    s/p  parathyroidectomy right inferior on 12-02-2012   Humerus fracture 04/25/2022   Hyperlipidemia    Hypertension    followd by nephrologist----  (09-20-2019  per pt had stress test done some time ago , unsure when/ where, thinks told ok)   OA (osteoarthritis)    Osteoporosis    Prosthetic joint infection (HCC)    followed by dr j. hatcher (ID)   left total shoulder arthroplasty 12/ 2016  ; 12/ 2020  revision with explant joint replacement and hemiarthroplasty;  completed 6 month antibiotic 05/ 2021   Vertigo     Past Surgical History:  Procedure Laterality Date   COLONOSCOPY     DILATATION & CURETTAGE/HYSTEROSCOPY WITH MYOSURE N/A 09/28/2019   Procedure: DILATATION & CURETTAGE/HYSTEROSCOPY WITH MYOSURE;  Surgeon: Olivia Mackie, MD;  Location: [facility];  Service: Gynecology;  Laterality: N/A;   EYE SURGERY  yrs ago   laser surgery for retinal tear.  (unilateral , pt unsure which side)   ORIF HUMERUS FRACTURE Right 04/27/2022   Procedure: OPEN REDUCTION INTERNAL FIXATION (ORIF) PROXIMAL HUMERUS FRACTURE;  Surgeon: Bjorn Pippin, MD;  Location: [facility];  Service: Orthopedics;  Laterality: Right;   PARATHYROIDECTOMY N/A 12/02/2012   Procedure: PARATHYROIDECTOMY with frozen section ;  Surgeon: Velora Heckler, MD;  Location: [facility];  Service: General;  Laterality: N/A;   REVERSE SHOULDER ARTHROPLASTY Right 04/27/2022   Procedure: REVERSE SHOULDER ARTHROPLASTY;  Surgeon: Bjorn Pippin, MD;  Location: [facility];  Service: Orthopedics;  Laterality: Right;   SHOULDER INJECTION Left 07/27/2013   Procedure: SHOULDER INJECTION;  Surgeon: Loreta Ave, MD;  Location: [facility] OR;  Service: Orthopedics;  Laterality: Left;   STERIOD INJECTION Right 02/14/2015   Procedure: RIGHT SHOULDER CORTISONE INJECTION;  Surgeon: Loreta Ave, MD;  Location: [facility] OR;  Service: Orthopedics;  Laterality: Right;   TOTAL HIP ARTHROPLASTY Left 07/27/2013   Procedure: TOTAL HIP ARTHROPLASTY ANTERIOR APPROACH;  Surgeon: Loreta Ave, MD;  Location: [facility] OR;  Service: Orthopedics;  Laterality: Left;   TOTAL SHOULDER ARTHROPLASTY Left 02/14/2015   Procedure: LEFT TOTAL SHOULDER ARTHROPLASTY;  Surgeon: Loreta Ave, MD;  Location: [facility] OR;  Service: Orthopedics;  Laterality: Left;   TOTAL SHOULDER REVISION Left 02/09/2019   Procedure: TOTAL SHOULDER REVISION;  Surgeon: Bjorn Pippin, MD;  Location: [facility];  Service: Orthopedics;  Laterality: Left;    Allergies: Abaloparatide, Mobic [meloxicam], Crestor [rosuvastatin], Norvasc [amlodipine besylate], Simvastatin, Sulfa antibiotics, and Zetia [ezetimibe]  Medications: Prior to Admission medications   Medication Sig Start Date End Date Taking? Authorizing Provider  allopurinol (ZYLOPRIM) 300 MG tablet Take 300 mg by mouth  daily.   Yes [provider]  calcium carbonate (TUMS - DOSED IN MG ELEMENTAL CALCIUM) 500 MG chewable tablet Chew 1 tablet by mouth 3 (three) times daily as needed for indigestion or heartburn.   Yes [provider]  ciprofloxacin (CIPRO) 500 MG tablet Take 500 mg by mouth 2 (two) times daily.   Yes [provider]  cloNIDine (CATAPRES) 0.1 MG tablet Take 0.1 mg by mouth daily as needed (for hypertension- if systolic b/p is over 160).   Yes [provider]  hydrALAZINE (APRESOLINE) 25 MG tablet Take 25 mg by mouth 3 (three) times daily.   Yes [provider]  magnesium oxide (MAG-OX) 400 (240 Mg) MG tablet Take 1 tablet (400 mg total) by mouth daily. 10/18/22  Yes Burnadette Pop, MD  aspirin EC 81 MG tablet Take 1 tablet (81 mg total) by mouth daily. Swallow whole. 12/26/22   Rolly Salter, MD  pantoprazole (PROTONIX) 40 MG tablet Take 1 tablet (40 mg total) by mouth 2 (two) times daily before a meal. Take 40 mg by mouth in the morning and at 5 PM- BEFORE FOOD 12/26/22   Rolly Salter, MD  potassium chloride SA (KLOR-CON M) 20 MEQ tablet Take 1 tablet (20 mEq total) by mouth daily for 3 days. 12/27/22 12/30/22  Rolly Salter, MD  Rivaroxaban (XARELTO) 15 MG TABS tablet Take 1 tablet (15 mg total) by mouth daily with supper. 12/26/22   Rolly Salter, MD  senna-docusate (SENOKOT-S) 8.6-50 MG tablet Take 1 tablet by mouth at bedtime as needed for moderate constipation. Patient not taking: Reported on 12/22/2022 10/17/22   Burnadette Pop, MD     Family History  Problem Relation Age of Onset   Heart failure Mother    Cancer Mother    Breast cancer Mother        in her 61s   Cancer Father     Social History   Socioeconomic History   Marital status: Widowed    Spouse name: Not on file   Number of children: Not on file   Years of education: Not on file   Highest education level: Not on file  Occupational History   Not on file  Tobacco Use    Smoking status: Former    Current packs/day: 0.00    Average packs/day: 1 pack/day for 20.0 years (20.0 ttl pk-yrs)    Types: Cigarettes    Start date: 10/28/1967    Quit date: 10/28/1987    Years since quitting: 35.2   Smokeless tobacco: Never  Vaping Use   Vaping status: Never Used  Substance and Sexual Activity   Alcohol use: No   Drug use: Never   Sexual activity: Yes    Birth control/protection: Post-menopausal  Other Topics Concern   Not on file  Social History Narrative   Not on file   Social Determinants of Health   Financial Resource Strain: Not on file  Food Insecurity: No Food Insecurity (12/22/2022)   Hunger Vital Sign    Worried About Running Out of Food in the Last Year: Never true    Ran Out of Food in the Last Year:  Never true  Transportation Needs: No Transportation Needs (12/22/2022)   PRAPARE - Administrator, Civil Service (Medical): No    Lack of Transportation (Non-Medical): No  Physical Activity: Not on file  Stress: Not on file  Social Connections: Not on file     Review of Systems: A 12 point ROS discussed and pertinent positives are indicated in the HPI above.  All other systems are negative.  Review of Systems  Unable to perform ROS: Mental status change    Vital Signs: BP (!) 135/59 (BP Location: Right Arm)   Pulse 63   Temp 98.2 F (36.8 C) (Oral)   Resp 16   Ht 5\' 5"  (1.651 m)   Wt 130 lb (59 kg)   SpO2 98%   BMI 21.63 kg/m   Physical Exam Vitals and nursing note reviewed.  Constitutional:      General: She is not in acute distress.    Appearance: Normal appearance. She is not ill-appearing.  HENT:     Mouth/Throat:     Mouth: Mucous membranes are moist.     Pharynx: Oropharynx is clear.  Cardiovascular:     Rate and Rhythm: Normal rate and regular rhythm.  Pulmonary:     Effort: Pulmonary effort is normal. No respiratory distress.     Breath sounds: Normal breath sounds.  Abdominal:     General: Abdomen  is flat.     Palpations: Abdomen is soft.  Skin:    General: Skin is warm and dry.  Neurological:     General: No focal deficit present.     Mental Status: She is alert and oriented to person, place, and time. Mental status is at baseline.  Psychiatric:        Mood and Affect: Mood normal.        Behavior: Behavior normal.        Thought Content: Thought content normal.        Judgment: Judgment normal.      MD Evaluation Airway: WNL Heart: WNL Abdomen: WNL Chest/ Lungs: WNL ASA  Classification: 3 Mallampati/Airway Score: Two   Imaging: CT CHEST ABDOMEN PELVIS W CONTRAST  Result Date: 01/03/2023 CLINICAL DATA:  Hypercalcemia, weight loss, rule out malignancy * Tracking Code: BO * EXAM: CT CHEST, ABDOMEN, AND PELVIS WITH CONTRAST TECHNIQUE: Multidetector CT imaging of the chest, abdomen and pelvis was performed following the standard protocol during bolus administration of intravenous contrast. RADIATION DOSE REDUCTION: This exam was performed according to the departmental dose-optimization program which includes automated exposure control, adjustment of the mA and/or kV according to patient size and/or use of iterative reconstruction technique. CONTRAST:  OMNIPAQUE IOHEXOL 300 MG/ML  SOLN COMPARISON:  CT abdomen pelvis, 06/21/2019 FINDINGS: CT CHEST FINDINGS Cardiovascular: Aortic atherosclerosis. Cardiomegaly. Three-vessel coronary artery calcifications. No pericardial effusion. Mediastinum/Nodes: No enlarged mediastinal, hilar, or axillary lymph nodes. Thyroid gland, trachea, and esophagus demonstrate no significant findings. Lungs/Pleura: Bibasilar scarring or atelectasis. No pleural effusion or pneumothorax. Musculoskeletal: No chest wall abnormality. No acute osseous findings. Status post bilateral shoulder arthroplasty. CT ABDOMEN PELVIS FINDINGS Hepatobiliary: No solid liver abnormality is seen. Layering sludge in the gallbladder. No discrete gallstones. No gallbladder wall  thickening, or biliary dilatation. Pancreas: Unremarkable. No pancreatic ductal dilatation or surrounding inflammatory changes. Spleen: Normal in size without significant abnormality. Adrenals/Urinary Tract: Benign non nodular adenomatous thickening of the adrenal glands. Mild bilateral hydronephrosis and hydroureter to the ureterovesicular junctions, without calculus or other obstruction visualized. Distended urinary bladder, measuring up  to 15.1 cm. Numerous bilateral renal lesions. The majority of these are simple appearing, fluid attenuation cysts, however there is a soft tissue attenuation lesion arising from the superior pole of the left kidney measuring 1.9 x 1.6 cm (series 3, image 69). Additional complex appearing cyst of the inferior pole of the right kidney with both fluid and soft tissue attenuation components, measuring 3.8 x 3.3 cm (series 3, image 73) and of the lateral midportion of the left kidney measuring 1.9 x 1.3 cm (series 3, image 67). Stomach/Bowel: Stomach is within normal limits. Appendix appears normal. No evidence of bowel wall thickening, distention, or inflammatory changes. Descending and sigmoid diverticulosis. Vascular/Lymphatic: Severe aortic atherosclerosis. No enlarged abdominal or pelvic lymph nodes. Reproductive: No mass or other abnormality. Other: No abdominal wall hernia or abnormality. No ascites. Musculoskeletal: No acute osseous findings. Status post left hip total arthroplasty. IMPRESSION: 1. No definite evidence of malignancy or metastatic disease in the chest, abdomen, or pelvis. 2. Soft tissue attenuation lesion arising from the superior pole of the left kidney measuring 1.9 x 1.6 cm concerning for solid renal mass. Additional complex appearing cysts of the inferior pole of the right kidney and lateral midportion of the left kidney. Recommend multiphasic contrast enhanced CT or MRI for further characterization, preferably on a nonemergent, outpatient basis, when  appropriate. 3. Mild bilateral hydronephrosis and hydroureter to the ureterovesicular junctions, without calculus or other obstruction visualized. Distended urinary bladder, measuring up to 15.1 cm. Findings are consistent with urinary retention and hydronephrosis secondary to back pressure. 4. Cardiomegaly and coronary artery disease. Aortic Atherosclerosis (ICD10-I70.0). Electronically Signed   By: Jearld Lesch M.D.   On: 01/03/2023 15:07   EEG adult  Result Date: 12/29/2022 [name], MD     12/29/2022  6:38 PM Patient Name: Jomary Ruffing MRN: [id number] Epilepsy Attending: [name] Referring Physician/Provider: Rolly Salter, MD Date: 12/29/2022 Duration: 25.08 mins Patient history: 82yo F with ams getting eeg to evaluate for seizure Level of alertness: Awake AEDs during EEG study: None Technical aspects: This EEG study was done with scalp electrodes positioned according to the 10-20 International system of electrode placement. Electrical activity was reviewed with band pass filter of 1-70Hz , sensitivity of 7 uV/mm, display speed of 67mm/sec with a 60Hz  notched filter applied as appropriate. EEG data were recorded continuously and digitally stored.  Video monitoring was available and reviewed as appropriate. Description: The posterior dominant rhythm consists of 8 Hz activity of moderate voltage (25-35 uV) seen predominantly in posterior head regions, symmetric and reactive to eye opening and eye closing. EEG showed intermittent generalized 3 to 6 Hz theta-delta slowing. Hyperventilation and photic stimulation were not performed.   ABNORMALITY - Intermittent slow, generalized IMPRESSION: This study is suggestive of mild to moderate diffuse encephalopathy. No seizures or epileptiform discharges were seen throughout the recording. [name]   MR BRAIN WO CONTRAST  Result Date: 12/29/2022 CLINICAL DATA:  Altered mental status EXAM: MRI HEAD WITHOUT CONTRAST TECHNIQUE:  Multiplanar, multiecho pulse sequences of the brain and surrounding structures were obtained without intravenous contrast. COMPARISON:  10/12/2022 FINDINGS: Brain: No restricted diffusion to suggest acute or subacute infarct. No acute hemorrhage, mass, mass effect, or midline shift. No hydrocephalus or extra-axial collection. Pituitary and craniocervical junction within normal limits. Redemonstrated hemosiderin staining in the right occipital lobe. Confluent T2 hyperintense signal in the periventricular white matter and pons, likely the sequela of moderate chronic small vessel ischemic disease. Vascular: Normal arterial flow voids. Skull  and upper cervical spine: Normal marrow signal. Sinuses/Orbits: Clear paranasal sinuses. No acute finding in the orbits. Status post bilateral lens replacements. Other: The mastoid air cells are well aerated. IMPRESSION: No acute intracranial process. No evidence of acute or subacute infarct. Electronically Signed   By: Wiliam Ke M.D.   On: 12/29/2022 01:42   ECHOCARDIOGRAM LIMITED  Result Date: 12/24/2022    ECHOCARDIOGRAM LIMITED REPORT   Patient Name:   SAVON GIUFFRIDA Date of Exam: 12/24/2022 Medical Rec #:  [id number]       Height:       65.0 in Accession #:    [id number]      Weight:       125.7 lb Date of Birth:  1940-07-12       BSA:          1.624 m Patient Age:    82 years        BP:           160/63 mmHg Patient Gender: F               HR:           68 bpm. Exam Location:  Inpatient Procedure: Limited Echo, Limited Color Doppler and Cardiac Doppler Indications:    Atrial fibrillation  History:        Patient has prior history of Echocardiogram examinations, most                 recent 10/13/2022. Risk Factors:Hypertension, Dyslipidemia and                 Diabetes. CKD.  Sonographer:    Milda Smart Referring Phys: [id number] PRANAV M PATEL  Sonographer Comments: Suboptimal subcostal window. Image acquisition challenging due to respiratory motion. IMPRESSIONS  1.  Left ventricular ejection fraction, by estimation, is 60 to 65%. The left ventricle has normal function.  2. Right ventricular systolic function is normal. The right ventricular size is normal. Tricuspid regurgitation signal is inadequate for assessing PA pressure.  3. The mitral valve is grossly normal. Trivial mitral valve regurgitation. No evidence of mitral stenosis.  4. The aortic valve is tricuspid. There is mild calcification of the aortic valve. Aortic valve regurgitation is trivial. Aortic valve sclerosis is present, with no evidence of aortic valve stenosis.  5. The inferior vena cava is normal in size with greater than 50% respiratory variability, suggesting right atrial pressure of 3 mmHg. Comparison(s): No significant change from prior study. FINDINGS  Left Ventricle: Left ventricular ejection fraction, by estimation, is 60 to 65%. The left ventricle has normal function. The left ventricular internal cavity size was normal in size. There is no left ventricular hypertrophy. Right Ventricle: The right ventricular size is normal. No increase in right ventricular wall thickness. Right ventricular systolic function is normal. Tricuspid regurgitation signal is inadequate for assessing PA pressure. Pericardium: There is no evidence of pericardial effusion. Presence of epicardial fat layer. Mitral Valve: The mitral valve is grossly normal. Trivial mitral valve regurgitation. No evidence of mitral valve stenosis. Tricuspid Valve: The tricuspid valve is grossly normal. Tricuspid valve regurgitation is trivial. No evidence of tricuspid stenosis. Aortic Valve: The aortic valve is tricuspid. There is mild calcification of the aortic valve. Aortic valve regurgitation is trivial. Aortic valve sclerosis is present, with no evidence of aortic valve stenosis. Pulmonic Valve: The pulmonic valve was grossly normal. Pulmonic valve regurgitation is trivial. No evidence of pulmonic stenosis. Venous: The inferior vena cava is  normal  in size with greater than 50% respiratory variability, suggesting right atrial pressure of 3 mmHg. Additional Comments: Spectral Doppler performed. Color Doppler performed.  LEFT VENTRICLE PLAX 2D LVIDd:         3.90 cm LVIDs:         2.50 cm LV PW:         1.00 cm LV IVS:        0.90 cm  LEFT ATRIUM         Index LA diam:    3.40 cm 2.09 cm/m  AORTIC VALVE LVOT Vmax:   129.00 cm/s LVOT Vmean:  93.400 cm/s LVOT VTI:    0.275 m  SHUNTS Systemic VTI: 0.28 m Lennie Odor MD Electronically signed by Lennie Odor MD Signature Date/Time: 12/24/2022/5:06:50 PM    Final    DG Chest Portable 1 View  Result Date: 12/22/2022 CLINICAL DATA:  Sepsis EXAM: PORTABLE CHEST 1 VIEW COMPARISON:  10/14/2022 FINDINGS: Stable heart size. Aortic atherosclerosis. No focal airspace consolidation, pleural effusion, or pneumothorax. Bilateral shoulder prostheses. IMPRESSION: No active disease. Electronically Signed   By: Duanne Guess D.O.   On: 12/22/2022 17:43    Labs:  CBC: Recent Labs    01/01/23 0416 01/02/23 0413 01/03/23 0440 01/04/23 0447  WBC 8.4 7.8 8.4 7.2  HGB 9.3* 9.2* 9.6* 9.1*  HCT 28.9* 28.7* 30.4* 28.0*  PLT 209 206 228 235    COAGS: Recent Labs    10/12/22 2013  INR 1.0    BMP: Recent Labs    01/01/23 0416 01/02/23 0413 01/03/23 0440 01/04/23 0447  NA 136 136 132* 137  K 3.1* 3.1* 3.3* 3.5  CL 109 111 106 108  CO2 21* 22 21* 23  GLUCOSE 102* 101* 104* 100*  BUN 15 12 10 19   CALCIUM 11.2* 11.1* 10.7* 11.5*  CREATININE 0.67 0.63 0.64 0.72  GFRNONAA >60 >60 >60 >60    LIVER FUNCTION TESTS: Recent Labs    10/12/22 2013 10/13/22 0738 12/24/22 0347 12/28/22 1256 01/01/23 0416 01/03/23 0440 01/04/23 0447  BILITOT 1.1 1.2  --  0.8  --   --   --   AST 16 14*  --  17  --   --   --   ALT 9 10  --  14  --   --   --   ALKPHOS 65 56  --  85  --   --   --   PROT 5.7* 5.1*  --  6.5  --   --   --   ALBUMIN 2.9* 2.6*   < > 3.0* 2.4* 2.5* 2.6*   < > = values in this  interval not displayed.    TUMOR MARKERS: No results for input(s): "AFPTM", "CEA", "CA199", "CHROMGRNA" in the last 8760 hours.  Assessment and Plan: Hypercalcemia, positive M spike IR consulted for bone marrow biopsy to rule out multiple myeloma at the request of Dr. Mosetta Putt.  Patient assessed at bedside.  Pleasant, somewhat confused.  NPO p MN CBC with diff ordered for tomorrow.   Risks and benefits was discussed with the patient and/or patient's family London Sheer) including, but not limited to bleeding, infection, damage to adjacent structures or low yield requiring additional tests.  All of the questions were answered and there is agreement to proceed.  Consent signed and in chart.   Thank you for this interesting consult.  I greatly enjoyed meeting Tina Frank and look forward to participating in their care.  A copy of  this report was sent to the requesting provider on this date.  Electronically Signed: Hoyt Koch, PA 01/04/2023, 2:10 PM   I spent a total of 20 Minutes    in face to face in clinical consultation, greater than 50% of which was counseling/coordinating care for hypercalcemia, rule out MM.

## 2023-01-05 DIAGNOSIS — G9340 Encephalopathy, unspecified: Secondary | ICD-10-CM | POA: Diagnosis not present

## 2023-01-05 DIAGNOSIS — N179 Acute kidney failure, unspecified: Secondary | ICD-10-CM | POA: Diagnosis not present

## 2023-01-05 DIAGNOSIS — I4891 Unspecified atrial fibrillation: Secondary | ICD-10-CM | POA: Diagnosis not present

## 2023-01-05 LAB — RENAL FUNCTION PANEL
Albumin: 2.3 g/dL — ABNORMAL LOW (ref 3.5–5.0)
Anion gap: 3 — ABNORMAL LOW (ref 5–15)
BUN: 19 mg/dL (ref 8–23)
CO2: 24 mmol/L (ref 22–32)
Calcium: 10.6 mg/dL — ABNORMAL HIGH (ref 8.9–10.3)
Chloride: 109 mmol/L (ref 98–111)
Creatinine, Ser: 0.71 mg/dL (ref 0.44–1.00)
GFR, Estimated: >60 mL/min (ref >60)
Glucose, Bld: 105 mg/dL — ABNORMAL HIGH (ref 70–99)
Phosphorus: 3 mg/dL (ref 2.5–4.6)
Potassium: 3.4 mmol/L — ABNORMAL LOW (ref 3.5–5.1)
Sodium: 136 mmol/L (ref 135–145)

## 2023-01-05 LAB — GLUCOSE, CAPILLARY
Glucose-Capillary: 100 mg/dL — ABNORMAL HIGH (ref 70–99)
Glucose-Capillary: 111 mg/dL — ABNORMAL HIGH (ref 70–99)
Glucose-Capillary: 137 mg/dL — ABNORMAL HIGH (ref 70–99)
Glucose-Capillary: 132 mg/dL — ABNORMAL HIGH (ref 70–99)

## 2023-01-05 LAB — CBC WITH DIFFERENTIAL/PLATELET
Abs Immature Granulocytes: 0.03 10*3/uL (ref 0.00–0.07)
Basophils Absolute: 0.1 10*3/uL (ref 0.0–0.1)
Basophils Relative: 1 %
Eosinophils Absolute: 0.4 10*3/uL (ref 0.0–0.5)
Eosinophils Relative: 5 %
HCT: 27.4 % — ABNORMAL LOW (ref 36.0–46.0)
Hemoglobin: 8.8 g/dL — ABNORMAL LOW (ref 12.0–15.0)
Immature Granulocytes: 0 %
Lymphocytes Relative: 20 %
Lymphs Abs: 1.4 10*3/uL (ref 0.7–4.0)
MCH: 30.2 pg (ref 26.0–34.0)
MCHC: 32.1 g/dL (ref 30.0–36.0)
MCV: 94.2 fL (ref 80.0–100.0)
Monocytes Absolute: 0.7 10*3/uL (ref 0.1–1.0)
Monocytes Relative: 10 %
Neutro Abs: 4.3 10*3/uL (ref 1.7–7.7)
Neutrophils Relative %: 64 %
Platelets: 226 10*3/uL (ref 150–400)
RBC: 2.91 MIL/uL — ABNORMAL LOW (ref 3.87–5.11)
RDW: 14.8 % (ref 11.5–15.5)
WBC: 6.8 10*3/uL (ref 4.0–10.5)
nRBC: 0 % (ref 0.0–0.2)

## 2023-01-05 LAB — MAGNESIUM: Magnesium: 1.8 mg/dL (ref 1.7–2.4)

## 2023-01-05 MED ORDER — ORAL CARE MOUTH RINSE: 15.0000 mL | OROMUCOSAL | Status: DC | PRN

## 2023-01-05 MED ORDER — BISACODYL 10 MG RE SUPP
10.0000 mg | Freq: Every day | RECTAL | Status: DC
  Administered 2023-01-05 – 2023-01-07 (×3): 10 mg via RECTAL
  Filled 2023-01-05 (×3): qty 1

## 2023-01-05 MED ORDER — POLYETHYLENE GLYCOL 3350 17 G PO PACK
17.0000 g | PACK | Freq: Two times a day (BID) | ORAL | Status: DC
  Administered 2023-01-05 – 2023-01-07 (×6): 17 g via ORAL
  Filled 2023-01-05 (×7): qty 1

## 2023-01-05 MED ORDER — POTASSIUM CHLORIDE 20 MEQ PO PACK
40.0000 meq | PACK | Freq: Once | ORAL | Status: AC
  Administered 2023-01-05: 40 meq via ORAL
  Filled 2023-01-05: qty 2

## 2023-01-05 MED ORDER — SENNOSIDES-DOCUSATE SODIUM 8.6-50 MG PO TABS
1.0000 | ORAL_TABLET | Freq: Two times a day (BID) | ORAL | Status: DC
  Administered 2023-01-05 – 2023-01-07 (×6): 1 via ORAL
  Filled 2023-01-05 (×7): qty 1

## 2023-01-05 NOTE — Plan of Care (Signed)
  Problem: Activity: Goal: Ability to tolerate increased activity will improve Outcome: Not Progressing   Problem: Safety: Goal: Ability to remain free from injury will improve Outcome: Progressing   Problem: Skin Integrity: Goal: Risk for impaired skin integrity will decrease Outcome: Progressing

## 2023-01-05 NOTE — Progress Notes (Signed)
Just contacted by Nursing, her most recent bladder scanned was 0 mL. This was repeated three times with consistent results. Discussed case and plan with primary team who has escalated her bowel regiment.

## 2023-01-05 NOTE — Progress Notes (Signed)
  [facility] Progress Note   Subjective:   Patient seen and examined bedside. No /co's.    Objective:   BP (!) 131/48 (BP Location: Right Arm)   Pulse 73   Temp 98.3 F (36.8 C) (Oral)   Resp 16   Ht 5\' 5"  (1.651 m)   Wt 59.1 kg   SpO2 100%   BMI 21.68 kg/m   Intake/Output Summary (Last 24 hours) at 01/05/2023 1539 Last data filed at 01/05/2023 1300 Gross per 24 hour  Intake 2447.8 ml  Output 1285 ml  Net 1162.8 ml   Weight change: 0.136 kg  Physical Exam: Gen: NAD, sitting up in bed CVS: RRR Resp: normal wob, unlabored, cta b/l Abd: soft Ext: no edema Neuro: drowsy, wakes up to vocal stimuli but falls back asleep rather quickly, following commands, very HOH  Assessment/ Plan:   Hypercalcemia -h/o hyperparathyroidism s/p rt inferior parathyroidectomy 11/2012. Work up so far: PTH 14 (appropriately suppressed), calcitriol levels low, vit d 25 WNL. TSH WNL, PTHrP low (<2). Etiology presumed to be secondary to high cal and vit d intake. Home calcium and vitamin D supplements on hold -abnormal SPEP and FLC ratio. UPEP pending -decreased fluid rate yesterday however calcium rebounded back up-- Cca 12.6. Of note, received lasix yesterday. C/w fluids---increasing rate back to 125cc/hr x 1 day and then reassess -Zometa 4mg  IV x 1 dose was given yesterday 10/27 . Ca++ stable at 10.6 today.  -Will need outpatient follow up with endocrinology. Hold calcium and vitamin D supplements on discharge. No other suggestions. Will sign off.   Abnormal SPEP -m-spike up to 0.4 (0.2 in August), FLC ratio 2.6, kappa dominant. Oncology following, plan is for BM bx today. Appreciate ONC assistance.  -ct c/a/p w/ contrast: without definitive evidence of malignancy/metastatic disease. Concern for solid left superior pole renal mass and additional complex cysts inferior pole of rt kidney and lateral left kidney. Mild bilateral hydronephrosis and hydroureter with distended bladder -open  for bone marrow biopsy  Renal mass Hydronephrosis by imaging Urinary retention - oncology following, would follow rec's in regards to renal mass.  - urology consulting today, appreciate assistance   AKI -baseline seems to be normal. Likely related to hypercalcemia. Fluids as above. AKI resolved   Acute metabolic encephalopathy -presumed to be secondary to UTI and hyperclacemia. Possible component of sundowning/delirium   E-coli UTI -s/p cefadroxil- per primary   New onset Afib -on xarelto, per primary   HTN -fluctuant BP, watch for now. Consider amlodipine if consistently elevated despite off fluids -avoid thiazides  Hypokalemia -replete prn  Discussed with primary service.  Tina Moselle  MD  [facility] 01/05/2023, 3:43 PM

## 2023-01-05 NOTE — Progress Notes (Addendum)
Bladder scan  per order:  @2347                                           431 ml@0505   Purewick canister: @ 0505                                @0605 

## 2023-01-05 NOTE — Consult Note (Signed)
Urology Consult Note   Requesting Attending Physician:  Rodolph Bong, MD Service Providing Consult: Urology  Consulting Attending: Dr. Annabell Howells   Reason for Consult:  hydronephrosis  HPI: Tina Frank is seen in consultation for reasons noted above at the request of Rodolph Bong, MD. 82 year old female with PMH significant for T2DM, hyperparathyroidism-s/p parathyroidectomy, primary HTN, chronic diastolic heart failure, and CVA.  Patient was found to have acute metabolic encephalopathy 2/2 nitrite positive UTI with associated AKI and hypercalcemia.  On the CT imaging mild bilateral hydronephrosis was noted.  Patient was alert, oriented, in no distress at time of assessment.  She had no specific urologic complaints.  She reports that she has not been having regular bowel movements and feels like she needs to use the bathroom, and then is unable to go.  She has been having bladder scans between 3 and 400 cc, and has been putting out a comparable amount of urine prior to each scan.    ------------------  Assessment:  82 y.o. female with mild bilateral hydronephrosis in the context of significant constipation.   Recommendations:  # Mild bilateral hydronephrosis  Considering the relatively minor level of hydronephrosis and preserved renal function, I am inclined to think that her moderate pelvic stool burden is restricting her bladder outlet.  Particularly on the axial imaging, the pressure on the bladder outlet can be appreciated.  Recommend scheduled aggressive bowel regimen.  If renal indices worsen would recommend Foley catheter placement. Doesn't appear necessary at this time.   #renal mass vs cyst Numerous simple cysts.  A 1.9 x 1.6 cm lesion on the superior left kidney and a 3.8 x 3.3 cm complex appearing cyst on the right kidney.  These will need to be followed up with on an outpatient basis.  Case and plan discussed with Dr. Annabell Howells  Past Medical History: Past  Medical History:  Diagnosis Date   CKD (chronic kidney disease), stage II    nephrologist-- dr Kathrene Bongo  [facility])   Diabetes mellitus type 2, diet-controlled (HCC)    followed by pcp---   (09-20-2019  per pt does not check blood sugar at home)   Fracture of humeral shaft, right, closed 04/25/2022   GERD (gastroesophageal reflux disease)    History of primary hyperparathyroidism    s/p  parathyroidectomy right inferior on 12-02-2012   Humerus fracture 04/25/2022   Hyperlipidemia    Hypertension    followd by nephrologist----  (09-20-2019  per pt had stress test done some time ago , unsure when/ where, thinks told ok)   OA (osteoarthritis)    Osteoporosis    Prosthetic joint infection (HCC)    followed by dr j. hatcher (ID)   left total shoulder arthroplasty 12/ 2016  ; 12/ 2020  revision with explant joint replacement and hemiarthroplasty;  completed 6 month antibiotic 05/ 2021   Vertigo     Past Surgical History:  Past Surgical History:  Procedure Laterality Date   COLONOSCOPY     DILATATION & CURETTAGE/HYSTEROSCOPY WITH MYOSURE N/A 09/28/2019   Procedure: DILATATION & CURETTAGE/HYSTEROSCOPY WITH MYOSURE;  Surgeon: Olivia Mackie, MD;  Location: [facility];  Service: Gynecology;  Laterality: N/A;   EYE SURGERY  yrs ago   laser surgery for retinal tear.  (unilateral , pt unsure which side)   ORIF HUMERUS FRACTURE Right 04/27/2022   Procedure: OPEN REDUCTION INTERNAL FIXATION (ORIF) PROXIMAL HUMERUS FRACTURE;  Surgeon: Bjorn Pippin, MD;  Location: [facility];  Service: Orthopedics;  Laterality: Right;   PARATHYROIDECTOMY N/A 12/02/2012   Procedure: PARATHYROIDECTOMY with frozen section ;  Surgeon: Velora Heckler, MD;  Location: [facility];  Service: General;  Laterality: N/A;   REVERSE SHOULDER ARTHROPLASTY Right 04/27/2022   Procedure: REVERSE SHOULDER ARTHROPLASTY;  Surgeon: Bjorn Pippin, MD;  Location: [facility];  Service: Orthopedics;  Laterality: Right;    SHOULDER INJECTION Left 07/27/2013   Procedure: SHOULDER INJECTION;  Surgeon: Loreta Ave, MD;  Location[facility] OR;  Service: Orthopedics;  Laterality: Left;   STERIOD INJECTION Right 02/14/2015   Procedure: RIGHT SHOULDER CORTISONE INJECTION;  Surgeon: Loreta Ave, MD;  Location: [facility] OR;  Service: Orthopedics;  Laterality: Right;   TOTAL HIP ARTHROPLASTY Left 07/27/2013   Procedure: TOTAL HIP ARTHROPLASTY ANTERIOR APPROACH;  Surgeon: Loreta Ave, MD;  Location: [facility] OR;  Service: Orthopedics;  Laterality: Left;   TOTAL SHOULDER ARTHROPLASTY Left 02/14/2015   Procedure: LEFT TOTAL SHOULDER ARTHROPLASTY;  Surgeon: Loreta Ave, MD;  Location: [facility] OR;  Service: Orthopedics;  Laterality: Left;   TOTAL SHOULDER REVISION Left 02/09/2019   Procedure: TOTAL SHOULDER REVISION;  Surgeon: Bjorn Pippin, MD;  Location: [facility];  Service: Orthopedics;  Laterality: Left;    Medication: Current Facility-Administered Medications  Medication Dose Route Frequency Provider Last Rate Last Admin   acetaminophen (TYLENOL) tablet 650 mg  650 mg Oral Q6H PRN Opyd, Lavone Neri, MD   650 mg at 01/03/23 1951   Or   acetaminophen (TYLENOL) suppository 650 mg  650 mg Rectal Q6H PRN Opyd, Lavone Neri, MD       allopurinol (ZYLOPRIM) tablet 300 mg  300 mg Oral Daily Rodolph Bong, MD   300 mg at 01/05/23 0930   aspirin EC tablet 81 mg  81 mg Oral Daily Rolly Salter, MD   81 mg at 01/05/23 0930   cloNIDine (CATAPRES) tablet 0.1 mg  0.1 mg Oral Daily PRN Rodolph Bong, MD       enoxaparin (LOVENOX) injection 60 mg  1 mg/kg Subcutaneous Q12H Yehudit, Boysel, RPH   60 mg at 01/05/23 4098   feeding supplement (ENSURE ENLIVE / ENSURE PLUS) liquid 237 mL  237 mL Oral BID BM Rodolph Bong, MD   237 mL at 01/04/23 1347   hydrALAZINE (APRESOLINE) tablet 100 mg  100 mg Oral Q8H Rodolph Bong, MD   100 mg at 01/05/23 1191   insulin aspart (novoLOG) injection 0-15 Units  0-15 Units Subcutaneous TID WC Narda Bonds, MD   3 Units at 01/04/23 1335   magnesium oxide (MAG-OX) tablet 400 mg  400 mg Oral Daily Rodolph Bong, MD   400 mg at 01/05/23 0930   meclizine (ANTIVERT) tablet 12.5 mg  12.5 mg Oral TID PRN Rodolph Bong, MD   12.5 mg at 01/03/23 0308   ondansetron (ZOFRAN) tablet 4 mg  4 mg Oral Q6H PRN Opyd, Lavone Neri, MD       Or   ondansetron (ZOFRAN) injection 4 mg  4 mg Intravenous Q6H PRN Opyd, Lavone Neri, MD       Oral care mouth rinse  15 mL Mouth Rinse PRN Opyd, Lavone Neri, MD       Oral care mouth rinse  15 mL Mouth Rinse PRN Rodolph Bong, MD       Oral care mouth rinse  15 mL Mouth Rinse PRN Rodolph Bong, MD       pantoprazole (PROTONIX) EC tablet 40  mg  40 mg Oral Daily Opyd, Lavone Neri, MD   40 mg at 01/05/23 0930   senna-docusate (Senokot-S) tablet 1 tablet  1 tablet Oral QHS PRN Rodolph Bong, MD       sodium chloride flush (NS) 0.9 % injection 3 mL  3 mL Intravenous Q12H Opyd, Lavone Neri, MD   3 mL at 01/03/23 0851   tamsulosin (FLOMAX) capsule 0.4 mg  0.4 mg Oral Daily Rodolph Bong, MD   0.4 mg at 01/05/23 0930    Allergies: Allergies  Allergen Reactions   Abaloparatide Other (See Comments)    "Burred vision, lightheaded" (patient does not recall this in 2024)   Mobic [Meloxicam] Hypertension   Crestor [Rosuvastatin] Other (See Comments)    Muscle aches   Norvasc [Amlodipine Besylate] Swelling and Other (See Comments)    Ankle swelling   Simvastatin Other (See Comments)    Muscle aches  Other Reaction(s): high LFTs   Sulfa Antibiotics Rash   Zetia [Ezetimibe] Other (See Comments)    Muscle aches    Social History: Social History   Tobacco Use   Smoking status: Former    Current packs/day: 0.00    Average packs/day: 1 pack/day for 20.0 years (20.0 ttl pk-yrs)    Types: Cigarettes    Start date: 10/28/1967    Quit date: 10/28/1987    Years since quitting: 35.2   Smokeless tobacco: Never  Vaping Use   Vaping status: Never Used  Substance  Use Topics   Alcohol use: No   Drug use: Never    Family History Family History  Problem Relation Age of Onset   Heart failure Mother    Cancer Mother    Breast cancer Mother        in her 44s   Cancer Father     ROS   Objective   Vital signs in last 24 hours: BP (!) 131/48 (BP Location: Right Arm)   Pulse 73   Temp 98.3 F (36.8 C) (Oral)   Resp 16   Ht 5\' 5"  (1.651 m)   Wt 59.1 kg   SpO2 100%   BMI 21.68 kg/m   Physical Exam General: NAD, A&O, resting, appropriate HEENT: /AT Pulmonary: Normal work of breathing Cardiovascular: no cyanosis Abdomen: Soft, NTTP, nondistended GU: purewick in place, clear yellow urine Neuro: Appropriate, no focal neurological deficits  Most Recent Labs: Lab Results  Component Value Date   WBC 6.8 01/05/2023   HGB 8.8 (L) 01/05/2023   HCT 27.4 (L) 01/05/2023   PLT 226 01/05/2023    Lab Results  Component Value Date   NA 136 01/05/2023   K 3.4 (L) 01/05/2023   CL 109 01/05/2023   CO2 24 01/05/2023   BUN 19 01/05/2023   CREATININE 0.71 01/05/2023   CALCIUM 10.6 (H) 01/05/2023   MG 1.8 01/05/2023   PHOS 3.0 01/05/2023    Lab Results  Component Value Date   INR 1.0 10/12/2022   APTT 33 02/05/2015     Urine Culture: @[id number] (laburin,org,[id number],[id number])@   IMAGING: No results found.  ------  Elmon Kirschner, NP Pager: [phone]   Please contact the urology consult pager with any further questions/concerns.

## 2023-01-05 NOTE — Progress Notes (Signed)
PROGRESS NOTE    Tina Frank  HYQ:[id number] DOB: 1941/01/28 DOA: 12/22/2022 PCP: Sigmund Hazel, MD    Chief Complaint  Patient presents with   Altered Mental Status    Brief Narrative:  Tina Frank is a 82 y.o. female with a history of diabetes mellitus type 2, hyperparathyroidism s/p parathyroidectomy, primary hypertension, chronic diastolic heart failure, CVA . Patient presented secondary to worsening confusion and refusal to eat, drink or take medication and found to have acute metabolic encephalopathy secondary to UTI and complicated by associated AKI and hypercalcemia.    Assessment & Plan:   Principal Problem:   Acute encephalopathy Active Problems:   Hypercalcemia   UTI (urinary tract infection)   AKI (acute kidney injury) (HCC)   Atrial fibrillation (HCC)   Elevated troponin   Chronic heart failure with preserved ejection fraction (HFpEF) (HCC)   Renal mass   Bilateral hydronephrosis   Acute urinary retention  #1 acute metabolic encephalopathy -Felt presumed secondary to UTI in the setting of hypercalcemia. -Patient noted to have some improvement with mental status however some concerns for waxing and waning as he became quite confused again on 12/28/2022.??  Sundowning. -Improving slowly clinically. -TSH 1.499, free T41.46, vitamin B12 of 362, vitamin B1 100.6.  Ammonia level < 10. -MRI brain with no acute intracranial process, no evidence of acute or subacute infarct noted. -EEG done with mild to moderate diffuse encephalopathy, no seizures or epileptiform discharges were seen throughout the recording. -Continue supportive care with IV fluids.  2.  Hypercalcemia/history of primary hyperparathyroidism status post parathyroidectomy in 2014 -??  Etiology of hypercalcemia. -May be secondary to exogenous calcium from home calcium and vitamin D supplementation. -PTH at 14 appropriately suppressed, calcitriol levels noted low, vitamin D 25 hydroxy within normal  limits.  TSH within normal limits. -PTH RP pending, UPEP pending. -Kappa lambda light chain ratio elevated at 2.64. -SPEP with 0.4% M spike which was 0.2% 2 months ago. -Patient seen in consultation by nephrology, placed on IV fluids with calcium level slowly trending down. -Corrected calcium at 11.96. -Nephrology recommending holding off on calcitonin and oral bisphosphonates early on in the hospitalization.  -Nephrology recommended input from hematology/oncology and as such hematology/oncology consulted. -Per hematology positive M protein spike has slightly elevated serum kappa light chain levels are not impressive and suspicion for multiple myeloma per hematology not extremely high. -Hematology oncology recommending bone marrow biopsy for further evaluation which will be done per IR tomorrow 01/05/2023.   -CT abdomen and pelvis recommended by hematology/oncology negative for malignancy or metastatic disease however does note 1.9 x 1.6 cm lesion in the left kidney concerning for renal malignancy.  -Iron studies also ordered by hematology/oncology with a iron level of 30, TIBC of 210, ferritin of 76, folate of 11.7,.  Vitamin B12 of 362. -Multiple myeloma panel pending. -Nephrology in agreement with hematology for 1 dose of Zometa 4 mg which was given 01/04/2023.  -Will need outpatient follow-up with endocrinology. -Hematology/oncology, nephrology following and appreciate their input and recommendations.  3.  AKI -Felt likely secondary to a prerenal azotemia in the setting of dehydration and hypercalcemia. -Renal function improved with hydration and currently resolved. -IV fluids increased back to 125 cc/h per nephrology. -Nephrology following.  4.  Hypokalemia/hypomagnesemia -Potassium at 3.4 this morning, magnesium at 1.8.   -Continue mag oxide 400 mg daily.   -Potassium packet 40 mEq x 1.  -Repeat labs in AM.  5.  E. coli UTI -Was on IV Rocephin, transitioned  to [facility] and  completed 7-day course of treatment. -No further antibiotics needed.  6.  New onset A-fib -2D echo from 10/13/2022 with a EF of 60 to 65%, grade 1 DD and mildly dilated left atrium. -Patient with history of bradycardia arrhythmia for which she was sent to cardiology in May 2024. -Patient felt not a great candidate for long-term anticoagulation but fall risk now is lower due to debility per son. -Patient placed on Xarelto which is preferred medication per family. -Patient has been placed on full dose Lovenox for now pending bone marrow biopsy per hematology recommendations to be done by IR tomorrow 01/06/2023..  7.  Elevated troponin -Currently asymptomatic. -Prior physician spoke with cardiology who reviewed the chart and felt no concerns for ACS.  Patient currently asymptomatic. -2D echo with normal EF, no significant valvular abnormality.  8.  Hypertension -Patient noted to have been on clonidine, hydralazine in the outpatient setting which were held on admission. -BP still elevated and as such hydralazine increased to 100 mg 3 times daily. -Status post Lasix 20 mg IV x 1 on 01/03/2023. -Continue home regimen as needed clonidine. -If further blood pressure control is needed to start Norvasc.  9.  Well-controlled, diet controlled diabetes mellitus type 2 -Hemoglobin A1c 6.0 (10/13/2022) -CBG 100 this morning. -SSI.  10.  Renal mass/hydronephrosis/urinary retention -Noted on CT abdomen and pelvis. -1.9 x 1.6 cm lesion in the left kidney noted concerning for renal cell malignancy.  CT also concerning for mild bilateral hydronephrosis and hydroureter.  To the uterovesical junctions without calculus or other obstruction visualized.  Distended urinary bladder measuring up to 15.1 cm findings consistent with urinary retention and hydronephrosis secondary to back pressure. -Patient noted with a urine output of 1.781 L over the past 24 hours. -Renal function currently stable. -Continue Flomax,  serial bladder scans.  -Patient constipated will place on bowel regimen which may be causing urinary retention. -Hematology/oncology to follow-up on lesion on left kidney in the outpatient setting and likely would obtain an MRI for further evaluation. -Urology consulted for further evaluation and management.  11.?  Vertigo -Patient stated on 01/01/2023, whenever she turns her head has a brief episode of spinning sensation which subsequently resolves on its own. -Patient does endorse a prior history of vertigo. -Clinical improvement. -Meclizine as needed.       DVT prophylaxis: Continue Xarelto>>>Lovenox pending bone marrow biopsy Code Status: DNR Family Communication: Updated patient, no family at bedside.  Disposition: SNF when medically stable.  Status is: Inpatient Remains inpatient appropriate because: Severity of illness   Consultants:  Nephrology: Dr. Thedore Mins 12/29/2022 Hematology/oncology: Dr. Mosetta Putt 01/02/2023 Urology:Cameron Sattenfield, NP  Procedures:  Chest x-ray 12/22/2022 MRI brain 12/28/2022 2D echo 12/24/2022 EEG 12/29/2022 CT abdomen and pelvis 01/03/2023  Antimicrobials:  Anti-infectives (From admission, onward)    Start     Dose/Rate Route Frequency Ordered Stop   12/26/22 0000  cefadroxil (DURICEF) 500 MG capsule        500 mg Oral 2 times daily 12/26/22 0938 12/30/22 2359   12/25/22 1000  cefadroxil (DURICEF) capsule 500 mg        500 mg Oral 2 times daily 12/25/22 0845 12/29/22 2240   12/23/22 1800  cefTRIAXone (ROCEPHIN) 2 g in sodium chloride 0.9 % 100 mL IVPB  Status:  Discontinued        2 g 200 mL/hr over 30 Minutes Intravenous Every 24 hours 12/22/22 2127 12/25/22 0845   12/22/22 1800  cefTRIAXone (ROCEPHIN) 2 g in sodium  chloride 0.9 % 100 mL IVPB        2 g 200 mL/hr over 30 Minutes Intravenous  Once 12/22/22 1756 12/22/22 1950         Subjective: Patient sleeping but arousable.  Denies any chest pain or shortness of breath.  No  abdominal pain.  Urine output of 1.781 L over the past 24 hours.  Patient states she was on bowel rest/n.p.o. since last night in anticipation of bone marrow biopsy and told biopsy will be delayed and will not be done until tomorrow.  Objective: Vitals:   01/04/23 2152 01/05/23 0500 01/05/23 0501 01/05/23 1149  BP: (!) 156/69  (!) 152/112 (!) 131/48  Pulse: 75  67 73  Resp: 18  16 16   Temp: 98.3 F (36.8 C)  98.5 F (36.9 C) 98.3 F (36.8 C)  TempSrc: Oral  Oral Oral  SpO2: 96%  98% 100%  Weight:  59.1 kg    Height:        Intake/Output Summary (Last 24 hours) at 01/05/2023 1614 Last data filed at 01/05/2023 1300 Gross per 24 hour  Intake 1872.13 ml  Output 1285 ml  Net 587.13 ml   Filed Weights   01/03/23 0500 01/04/23 0500 01/05/23 0500  Weight: 60.5 kg 59 kg 59.1 kg    Examination:  General exam: NAD Respiratory system: Lungs clear to auscultation bilaterally anterior lung fields.  No wheezes, no crackles, no rhonchi.  Fair air movement.  Speaking in full sentences.   Cardiovascular system: Regular rate rhythm no murmurs rubs or gallops.  No JVD.  No lower extremity edema.  Gastrointestinal system: Abdomen is soft, nontender, nondistended, positive bowel sounds.  No rebound.  No guarding.  Central nervous system: Alert and oriented.  Moving extremities spontaneously.  No focal neurological deficits. Extremities: Symmetric 5 x 5 power. Skin: No rashes, lesions or ulcers Psychiatry: Judgement and insight appear normal. Mood & affect appropriate.     Data Reviewed: I have personally reviewed following labs and imaging studies  CBC: Recent Labs  Lab 01/01/23 0416 01/02/23 0413 01/03/23 0440 01/04/23 0447 01/05/23 0424  WBC 8.4 7.8 8.4 7.2 6.8  NEUTROABS  --   --  6.4  --  4.3  HGB 9.3* 9.2* 9.6* 9.1* 8.8*  HCT 28.9* 28.7* 30.4* 28.0* 27.4*  MCV 94.4 93.2 93.8 94.0 94.2  PLT 209 206 228 235 226    Basic Metabolic Panel: Recent Labs  Lab 01/01/23 0416  01/02/23 0413 01/03/23 0440 01/04/23 0447 01/05/23 0424  NA 136 136 132* 137 136  K 3.1* 3.1* 3.3* 3.5 3.4*  CL 109 111 106 108 109  CO2 21* 22 21* 23 24  GLUCOSE 102* 101* 104* 100* 105*  BUN 15 12 10 19 19   CREATININE 0.67 0.63 0.64 0.72 0.71  CALCIUM 11.2* 11.1* 10.7* 11.5* 10.6*  MG 2.1 1.8 1.9 1.8 1.8  PHOS 2.7  --  2.8 3.4 3.0    GFR: Estimated Creatinine Clearance: 47.1 mL/min (by C-G formula based on SCr of 0.71 mg/dL).  Liver Function Tests: Recent Labs  Lab 01/01/23 0416 01/03/23 0440 01/04/23 0447 01/05/23 0424  ALBUMIN 2.4* 2.5* 2.6* 2.3*    CBG: Recent Labs  Lab 01/04/23 1218 01/04/23 1540 01/04/23 2153 01/05/23 0720 01/05/23 1150  GLUCAP 157* 115* 134* 100* 111*     No results found for this or any previous visit (from the past 240 hour(s)).        Radiology Studies: No results found.  Scheduled Meds:  allopurinol  300 mg Oral Daily   aspirin EC  81 mg Oral Daily   bisacodyl  10 mg Rectal Daily   enoxaparin (LOVENOX) injection  1 mg/kg Subcutaneous Q12H   feeding supplement  237 mL Oral BID BM   hydrALAZINE  100 mg Oral Q8H   insulin aspart  0-15 Units Subcutaneous TID WC   magnesium oxide  400 mg Oral Daily   pantoprazole  40 mg Oral Daily   polyethylene glycol  17 g Oral BID   senna-docusate  1 tablet Oral BID   sodium chloride flush  3 mL Intravenous Q12H   tamsulosin  0.4 mg Oral Daily   Continuous Infusions:      LOS: 14 days    Time spent: 35 minutes    Ramiro Harvest, MD [facility]   To contact the attending provider between 7A-7P or the covering provider during after hours 7P-7A, please log into the web site [email or web address] and access using universal [place] password for that web site. If you do not have the password, please call the hospital operator.  01/05/2023, 4:14 PM

## 2023-01-05 NOTE — Progress Notes (Signed)
Physical Therapy Treatment Patient Details Name: Tina Frank MRN: [id number] DOB: [birth year] Today's Date: 01/05/2023   History of Present Illness 82 y.o. female with a history of diabetes mellitus type 2, hyperparathyroidism s/p parathyroidectomy, primary hypertension, chronic diastolic heart failure, internal capsule infarct August 2024, R reverse TSA 04/27/22 .  Patient presented secondary to worsening confusion and refusal to eat, drink or take medication and found to have acute metabolic encephalopathy secondary to UTI and complicated by associated AKI and hypercalcemia    PT Comments  AxO x 3 very pleasant Lady.  Assisted OOB was difficult.  General bed mobility comments: Total Assist using bed pad to complete scooting EOB.  Initital posterior lean.  Fear of falling. General transfer comment: Applied personal shoes for better "griping".  Pt required + 2 Max Assist to rise.  Therapist "blocking" feet to prevent "skiing"   Assisted to [facility] great difficulty completing 1/4 pivot turn. General Gait Details: very limited gait due to weakness/fear/effort Pt will need ST Rehab at SNF to address mobility and functional decline prior to safely returning home.    If plan is discharge home, recommend the following: A lot of help with bathing/dressing/bathroom;Two people to help with walking and/or transfers;Assistance with cooking/housework;Assist for transportation;Help with stairs or ramp for entrance   Can travel by private vehicle     No  Equipment Recommendations  None recommended by PT    Recommendations for Other Services       Precautions / Restrictions Precautions Precautions: Fall Precaution Comments: hx of falls (with R shoulder fx in Feb 2024) Restrictions Weight Bearing Restrictions: No     Mobility  Bed Mobility Overal bed mobility: Needs Assistance Bed Mobility: Supine to Sit     Supine to sit: Max assist, +2 for physical assistance     General bed mobility  comments: Total Assist using bed pad to complete scooting EOB.  Initital posterior lean.  Fear of falling.    Transfers Overall transfer level: Needs assistance Equipment used: Rolling walker (2 wheels) Transfers: Sit to/from Stand Sit to Stand: From elevated surface, Max assist, +2 safety/equipment, +2 physical assistance           General transfer comment: Applied personal shoes for better "griping".  Pt required + 2 Max Assist to rise.  Therapist "blocking" feet to prevent "skiing"   Assisted to [facility] great difficulty completing 1/4 pivot turn.    Ambulation/Gait Ambulation/Gait assistance: Max assist, +2 physical assistance, +2 safety/equipment Gait Distance (Feet): 2 Feet Assistive device: Rolling walker (2 wheels) Gait Pattern/deviations: Step-to pattern, Decreased step length - left       General Gait Details: very limited gait due to weakness/fear/effort   Stairs             Wheelchair Mobility     Tilt Bed    Modified Rankin (Stroke Patients Only)       Balance                                            Cognition Arousal: Alert Behavior During Therapy: WFL for tasks assessed/performed                                   General Comments: fearful of falling        Exercises  General Comments        Pertinent Vitals/Pain Pain Assessment Pain Assessment: Faces Faces Pain Scale: Hurts little more Pain Location: R shoulder and buttock    Home Living                          Prior Function            PT Goals (current goals can now be found in the care plan section) Progress towards PT goals: Progressing toward goals    Frequency    Min 1X/week      PT Plan      Co-evaluation              AM-PAC PT "6 Clicks" Mobility   Outcome Measure  Help needed turning from your back to your side while in a flat bed without using bedrails?: A Lot Help needed moving from lying on  your back to sitting on the side of a flat bed without using bedrails?: A Lot Help needed moving to and from a bed to a chair (including a wheelchair)?: A Lot Help needed standing up from a chair using your arms (e.g., wheelchair or bedside chair)?: A Lot Help needed to walk in hospital room?: A Lot Help needed climbing 3-5 steps with a railing? : Total 6 Click Score: 11    End of Session Equipment Utilized During Treatment: Gait belt Activity Tolerance: Patient limited by fatigue Patient left: with chair alarm set;with call bell/phone within reach;in chair Nurse Communication: Mobility status PT Visit Diagnosis: History of falling (Z91.81);Difficulty in walking, not elsewhere classified (R26.2)     Time: 9604-5409 PT Time Calculation (min) (ACUTE ONLY): 17 min  Charges:    $Therapeutic Activity: 8-22 mins PT General Charges $$ ACUTE PT VISIT: 1 Visit                     {Lei Dower  PTA Acute  Rehabilitation Services Office M-F          [phone]

## 2023-01-06 ENCOUNTER — Inpatient Hospital Stay (HOSPITAL_COMMUNITY): Payer: Medicare PPO

## 2023-01-06 DIAGNOSIS — G9340 Encephalopathy, unspecified: Secondary | ICD-10-CM | POA: Diagnosis not present

## 2023-01-06 DIAGNOSIS — I4891 Unspecified atrial fibrillation: Secondary | ICD-10-CM | POA: Diagnosis not present

## 2023-01-06 DIAGNOSIS — N179 Acute kidney failure, unspecified: Secondary | ICD-10-CM | POA: Diagnosis not present

## 2023-01-06 DIAGNOSIS — N390 Urinary tract infection, site not specified: Secondary | ICD-10-CM | POA: Diagnosis not present

## 2023-01-06 LAB — MULTIPLE MYELOMA PANEL, SERUM
Albumin SerPl Elph-Mcnc: 2.3 g/dL — ABNORMAL LOW (ref 2.9–4.4)
Albumin/Glob SerPl: 1 (ref 0.7–1.7)
Alpha 1: 0.3 g/dL (ref 0.0–0.4)
Alpha2 Glob SerPl Elph-Mcnc: 0.7 g/dL (ref 0.4–1.0)
B-Globulin SerPl Elph-Mcnc: 0.8 g/dL (ref 0.7–1.3)
Gamma Glob SerPl Elph-Mcnc: 0.8 g/dL (ref 0.4–1.8)
Globulin, Total: 2.5 g/dL (ref 2.2–3.9)
IgA: 150 mg/dL (ref 64–422)
IgG (Immunoglobin G), Serum: 545 mg/dL — ABNORMAL LOW (ref 586–1602)
IgM (Immunoglobulin M), Srm: 440 mg/dL — ABNORMAL HIGH (ref 26–217)
M Protein SerPl Elph-Mcnc: 0.3 g/dL — ABNORMAL HIGH
Total Protein ELP: 4.8 g/dL — ABNORMAL LOW (ref 6.0–8.5)

## 2023-01-06 LAB — RENAL FUNCTION PANEL
Albumin: 2.5 g/dL — ABNORMAL LOW (ref 3.5–5.0)
Anion gap: 6 (ref 5–15)
BUN: 14 mg/dL (ref 8–23)
CO2: 22 mmol/L (ref 22–32)
Calcium: 9.9 mg/dL (ref 8.9–10.3)
Chloride: 106 mmol/L (ref 98–111)
Creatinine, Ser: 0.62 mg/dL (ref 0.44–1.00)
GFR, Estimated: >60 mL/min (ref >60)
Glucose, Bld: 115 mg/dL — ABNORMAL HIGH (ref 70–99)
Phosphorus: 2 mg/dL — ABNORMAL LOW (ref 2.5–4.6)
Potassium: 3.6 mmol/L (ref 3.5–5.1)
Sodium: 134 mmol/L — ABNORMAL LOW (ref 135–145)

## 2023-01-06 LAB — CBC WITH DIFFERENTIAL/PLATELET
Abs Immature Granulocytes: 0.03 10*3/uL (ref 0.00–0.07)
Basophils Absolute: 0.1 10*3/uL (ref 0.0–0.1)
Basophils Relative: 1 %
Eosinophils Absolute: 0.2 10*3/uL (ref 0.0–0.5)
Eosinophils Relative: 3 %
HCT: 28 % — ABNORMAL LOW (ref 36.0–46.0)
Hemoglobin: 8.7 g/dL — ABNORMAL LOW (ref 12.0–15.0)
Immature Granulocytes: 1 %
Lymphocytes Relative: 16 %
Lymphs Abs: 1.1 10*3/uL (ref 0.7–4.0)
MCH: 29.7 pg (ref 26.0–34.0)
MCHC: 31.1 g/dL (ref 30.0–36.0)
MCV: 95.6 fL (ref 80.0–100.0)
Monocytes Absolute: 0.6 10*3/uL (ref 0.1–1.0)
Monocytes Relative: 9 %
Neutro Abs: 4.7 10*3/uL (ref 1.7–7.7)
Neutrophils Relative %: 70 %
Platelets: 242 10*3/uL (ref 150–400)
RBC: 2.93 MIL/uL — ABNORMAL LOW (ref 3.87–5.11)
RDW: 14.8 % (ref 11.5–15.5)
WBC: 6.6 10*3/uL (ref 4.0–10.5)
nRBC: 0 % (ref 0.0–0.2)

## 2023-01-06 LAB — MAGNESIUM: Magnesium: 1.9 mg/dL (ref 1.7–2.4)

## 2023-01-06 LAB — GLUCOSE, CAPILLARY
Glucose-Capillary: 115 mg/dL — ABNORMAL HIGH (ref 70–99)
Glucose-Capillary: 116 mg/dL — ABNORMAL HIGH (ref 70–99)
Glucose-Capillary: 110 mg/dL — ABNORMAL HIGH (ref 70–99)
Glucose-Capillary: 123 mg/dL — ABNORMAL HIGH (ref 70–99)

## 2023-01-06 MED ORDER — BISACODYL 10 MG RE SUPP: 10.0000 mg | Freq: Every day | RECTAL | Status: AC

## 2023-01-06 MED ORDER — FENTANYL CITRATE (PF) 100 MCG/2ML IJ SOLN
INTRAMUSCULAR | Status: AC
  Filled 2023-01-06: qty 2

## 2023-01-06 MED ORDER — TAMSULOSIN HCL 0.4 MG PO CAPS: 0.4000 mg | ORAL_CAPSULE | Freq: Every day | ORAL | Status: DC

## 2023-01-06 MED ORDER — MIDAZOLAM HCL 2 MG/2ML IJ SOLN
INTRAMUSCULAR | Status: AC | PRN
Start: 2023-01-06 — End: 2023-01-06
  Administered 2023-01-06: 1 mg via INTRAVENOUS

## 2023-01-06 MED ORDER — HYDRALAZINE HCL 100 MG PO TABS: 100.0000 mg | ORAL_TABLET | Freq: Three times a day (TID) | ORAL | Status: AC

## 2023-01-06 MED ORDER — MIDAZOLAM HCL 2 MG/2ML IJ SOLN
INTRAMUSCULAR | Status: AC
  Filled 2023-01-06: qty 4

## 2023-01-06 MED ORDER — SENNOSIDES-DOCUSATE SODIUM 8.6-50 MG PO TABS: 1.0000 | ORAL_TABLET | Freq: Two times a day (BID) | ORAL | Status: AC

## 2023-01-06 MED ORDER — FENTANYL CITRATE (PF) 100 MCG/2ML IJ SOLN
INTRAMUSCULAR | Status: AC | PRN
  Administered 2023-01-06: 50 ug via INTRAVENOUS

## 2023-01-06 MED ORDER — POLYETHYLENE GLYCOL 3350 17 G PO PACK: 17.0000 g | PACK | Freq: Two times a day (BID) | ORAL | Status: DC

## 2023-01-06 MED ORDER — K PHOS MONO-SOD PHOS DI & MONO 155-852-130 MG PO TABS: 250.0000 mg | ORAL_TABLET | Freq: Two times a day (BID) | ORAL | Status: AC

## 2023-01-06 MED ORDER — ENSURE ENLIVE PO LIQD: 237.0000 mL | Freq: Two times a day (BID) | ORAL | Status: DC

## 2023-01-06 MED ORDER — RIVAROXABAN 15 MG PO TABS
15.0000 mg | ORAL_TABLET | Freq: Every day | ORAL | Status: DC
  Administered 2023-01-07 – 2023-01-08 (×2): 15 mg via ORAL
  Filled 2023-01-06 (×2): qty 1

## 2023-01-06 MED ORDER — K PHOS MONO-SOD PHOS DI & MONO 155-852-130 MG PO TABS
250.0000 mg | ORAL_TABLET | Freq: Two times a day (BID) | ORAL | Status: DC
  Administered 2023-01-06 – 2023-01-08 (×5): 250 mg via ORAL
  Filled 2023-01-06 (×6): qty 1

## 2023-01-06 MED ORDER — MECLIZINE HCL 12.5 MG PO TABS: 12.5000 mg | ORAL_TABLET | Freq: Three times a day (TID) | ORAL | 0 refills | Status: DC | PRN

## 2023-01-06 NOTE — Progress Notes (Signed)
Patient Bladder Scan  At 0051 shows 217 ml. At 0455 shows 629 ml. Assisted patient to the [facility], patient voided 500 ml. Post void bladder scan shows 46 ml. Patient alert and complains of dizziness but patient refuse to take PRN meclizine. Patient in bed locked and in low position,side rails upright. Call light given and within reach.  Comfort measures provided.

## 2023-01-06 NOTE — Progress Notes (Signed)
Speech Language Pathology Treatment: Dysphagia  Patient Details Name: Tina Frank MRN: [id number] DOB: March 02, 1941 Today's Date: 01/06/2023 Time: 5188-4166 SLP Time Calculation (min) (ACUTE ONLY): 12 min  Assessment / Plan / Recommendation Clinical Impression  Patient seen by SLP for skilled intervention focused on dysphagia management. Patient was asleep when SLP entered the room but easily roused. However, multiple cues to remain awake were required throughout the session. Patient's son remained at bedside and stated patient was noticeably more lethargic today after completing procedures earlier. RN and patient's son reported patient has been demonstrating difficulty swallowing pills in a timely manner. Patient's son stated that patient has otherwise tolerated diet well. SLP directly observed patient with thin liquid (ensure) via straw. No overt s/sx of aspiration noted. Prolonged bolus formation and suspicion of delayed swallow initiation. No further PO's attempted due to patient lethargy. SLP recommending continuation of regular solids, thin liquids at this time. Medications may continue to be administered crushed in puree at tolerated. ST will continue to follow for diet toleration monitoring.   HPI HPI: Tina Frank is a 82 y.o. female adm with AMS, refusal to eat/drink nor take medications. Found to have metabolic encephalopathy d/t UTI and AKI with hypercalcemia. 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 CXR on this admit was negative, prior CXR suggested CHF 10/2022. Pt was placed on nectar thick liquids clinically following her CVA in August 2024 and then was advanced to thin liquids prior to discharge from the hospital. She has undergone prior UGI testing 2019- that showed 13 mm barium tablet did not transit through the GEJ despite mutiple sips of  water and monitoring for 4 minutes - but pt was not sensate to pill lodging. Small sliding scale hiatal hernia and nod visible esophagitis noted. ST swallow eval ordered on 10/15, patient was started on regular, thin liquid diet.      SLP Plan  Continue with current plan of care      Recommendations for follow up therapy are one component of a multi-disciplinary discharge planning process, led by the attending physician.  Recommendations may be updated based on patient status, additional functional criteria and insurance authorization.    Recommendations  Diet recommendations: Regular;Thin liquid Liquids provided via: Cup;Straw Medication Administration: Crushed with puree Supervision: Full supervision/cueing for compensatory strategies;Staff to assist with self feeding Compensations: Minimize environmental distractions;Slow rate;Small sips/bites Postural Changes and/or Swallow Maneuvers: Seated upright 90 degrees;Upright 30-60 min after meal                  Oral care BID     Dysphagia, unspecified (R13.10)     Continue with current plan of care     Marline Backbone, Senaida Lange., Speech Therapy Student   01/06/2023, 4:34 PM

## 2023-01-06 NOTE — Progress Notes (Addendum)
PROGRESS NOTE    Tina Frank  NFA:[id number] DOB: 13-May-1940 DOA: 12/22/2022 PCP: Sigmund Hazel, MD    Chief Complaint  Patient presents with   Altered Mental Status    Brief Narrative:  Tina Frank is a 82 y.o. female with a history of diabetes mellitus type 2, hyperparathyroidism s/p parathyroidectomy, primary hypertension, chronic diastolic heart failure, CVA . Patient presented secondary to worsening confusion and refusal to eat, drink or take medication and found to have acute metabolic encephalopathy secondary to UTI and complicated by associated AKI and hypercalcemia.    Assessment & Plan:   Principal Problem:   Acute encephalopathy Active Problems:   Hypercalcemia   UTI (urinary tract infection)   AKI (acute kidney injury) (HCC)   Atrial fibrillation (HCC)   Elevated troponin   Chronic heart failure with preserved ejection fraction (HFpEF) (HCC)   Renal mass   Bilateral hydronephrosis   Acute urinary retention  #1 acute metabolic encephalopathy -Felt presumed secondary to UTI in the setting of hypercalcemia. -Patient noted to have some improvement with mental status however some concerns for waxing and waning as he became quite confused again on 12/28/2022.??  Sundowning. -Improving slowly clinically. -TSH 1.499, free T41.46, vitamin B12 of 362, vitamin B1 100.6.  Ammonia level < 10. -MRI brain with no acute intracranial process, no evidence of acute or subacute infarct noted. -EEG done with mild to moderate diffuse encephalopathy, no seizures or epileptiform discharges were seen throughout the recording. -Continue supportive care with IV fluids.  2.  Hypercalcemia/history of primary hyperparathyroidism status post parathyroidectomy in 2014 -??  Etiology of hypercalcemia. -May be secondary to exogenous calcium from home calcium and vitamin D supplementation. -PTH at 14 appropriately suppressed, calcitriol levels noted low, vitamin D 25 hydroxy within normal  limits.  TSH within normal limits. -PTH RP pending, UPEP pending. -Kappa lambda light chain ratio elevated at 2.64. -SPEP with 0.4% M spike which was 0.2% 2 months ago. -Patient seen in consultation by nephrology, placed on IV fluids with calcium level slowly trending down. -Corrected calcium at 11.1. -Nephrology recommending holding off on calcitonin and oral bisphosphonates early on in the hospitalization.  -Nephrology recommended input from hematology/oncology and as such hematology/oncology consulted. -Per hematology positive M protein spike has slightly elevated serum kappa light chain levels are not impressive and suspicion for multiple myeloma per hematology not extremely high. -Hematology oncology recommended bone marrow biopsy for further evaluation which was done this morning, 01/06/2023.  -CT abdomen and pelvis recommended by hematology/oncology negative for malignancy or metastatic disease however does note 1.9 x 1.6 cm lesion in the left kidney concerning for renal malignancy.  -Iron studies also ordered by hematology/oncology with a iron level of 30, TIBC of 210, ferritin of 76, folate of 11.7,.  Vitamin B12 of 362. -Multiple myeloma panel pending. -Nephrology in agreement with hematology for 1 dose of Zometa 4 mg which was given 01/04/2023.  -Calcium levels continue to trend down. -Will need outpatient follow-up with endocrinology. -Hematology/oncology, nephrology following and appreciate their input and recommendations. -Will not resume calcium and vitamin D supplementation on discharge. -Will also follow-up with hematology/oncology outpatient setting in 1 week.  3.  AKI -Felt likely secondary to a prerenal azotemia in the setting of dehydration and hypercalcemia. -Renal function improved with hydration and currently resolved. -Was on IV fluids at 125 cc an hour per nephrology.   -Saline lock IV fluids.   -Nephrology was following but has signed off.   4.   Hypokalemia/hypomagnesemia/hypophosphatemia -  Potassium at 3.6 this morning, magnesium at 1.9, phosphorus at 2.0.   -K-Phos 250 mg p.o. twice daily x 5 days.   -Continue mag oxide 400 mg daily.  -Repeat labs in AM.  5.  E. coli UTI -Was on IV Rocephin, transitioned to [facility] and completed 7-day course of treatment. -No further antibiotics needed.  6.  New onset A-fib -2D echo from 10/13/2022 with a EF of 60 to 65%, grade 1 DD and mildly dilated left atrium. -Patient with history of bradycardia arrhythmia for which she was sent to cardiology in May 2024. -Patient felt not a great candidate for long-term anticoagulation but fall risk now is lower due to debility per son. -Patient placed on Xarelto which is preferred medication per family. -Patient has been placed on full dose Lovenox in anticipation of bone marrow biopsy which was done 01/06/2023.   -Resume Xarelto tomorrow 01/07/2023.    7.  Elevated troponin -Currently asymptomatic. -Prior physician spoke with cardiology who reviewed the chart and felt no concerns for ACS.  Patient currently asymptomatic. -2D echo with normal EF, no significant valvular abnormality.  8.  Hypertension -Patient noted to have been on clonidine, hydralazine in the outpatient setting which were held on admission. -BP still elevated and as such hydralazine increased to 100 mg 3 times daily. -Status post Lasix 20 mg IV x 1 on 01/03/2023. -Continue home regimen as needed clonidine. -BP currently controlled on current regimen of hydralazine. -If further blood pressure control is needed will start Norvasc.  9.  Well-controlled, diet controlled diabetes mellitus type 2 -Hemoglobin A1c 6.0 (10/13/2022) -CBG 115 this morning. -SSI.  10.  Renal mass/hydronephrosis/urinary retention -Noted on CT abdomen and pelvis. -1.9 x 1.6 cm lesion in the left kidney noted concerning for renal cell malignancy.  CT also concerning for mild bilateral hydronephrosis and  hydroureter.  To the uterovesical junctions without calculus or other obstruction visualized.  Distended urinary bladder measuring up to 15.1 cm findings consistent with urinary retention and hydronephrosis secondary to back pressure. -Patient noted with a urine output of 900 over the past 24 hours, urine output not accurately recorded.. -Renal function currently stable. -Patient with minimal PVR. -Continue Flomax, serial bladder scans.  -Patient constipated and as such patient placed on bowel regimen which may be causing urinary retention per urology recommendations. -Hematology/oncology to follow-up on lesion on left kidney in the outpatient setting and likely would obtain an MRI for further evaluation. -Urology also to follow-up on kidney lesions in the outpatient setting. -Urology following and appreciate their input and recommendations.  11.?  Vertigo -Patient stated on 01/01/2023, whenever she turns her head has a brief episode of spinning sensation which subsequently resolves on its own. -Patient does endorse a prior history of vertigo. -Clinical improvement. -Meclizine as needed.       DVT prophylaxis: Xarelto >>>> Lovenox >>>> Xarelto Code Status: DNR Family Communication: Updated patient, updated son on the telephone.   Disposition: SNF when medically stable hopefully tomorrow, 01/07/2023.  Status is: Inpatient Remains inpatient appropriate because: Severity of illness   Consultants:  Nephrology: Dr. Thedore Mins 12/29/2022 Hematology/oncology: Dr. Mosetta Putt 01/02/2023 Urology:Cameron Sattenfield, NP  Procedures:  Chest x-ray 12/22/2022 MRI brain 12/28/2022 2D echo 12/24/2022 EEG 12/29/2022 CT abdomen and pelvis 01/03/2023 CT-guided aspirate and core biopsy of right iliac bone per IR: Dr. Elby Showers 01/06/2023  Antimicrobials:  Anti-infectives (From admission, onward)    Start     Dose/Rate Route Frequency Ordered Stop   12/26/22 0000  cefadroxil (DURICEF) 500 MG  capsule         500 mg Oral 2 times daily 12/26/22 0938 12/30/22 2359   12/25/22 1000  cefadroxil (DURICEF) capsule 500 mg        500 mg Oral 2 times daily 12/25/22 0845 12/29/22 2240   12/23/22 1800  cefTRIAXone (ROCEPHIN) 2 g in sodium chloride 0.9 % 100 mL IVPB  Status:  Discontinued        2 g 200 mL/hr over 30 Minutes Intravenous Every 24 hours 12/22/22 2127 12/25/22 0845   12/22/22 1800  cefTRIAXone (ROCEPHIN) 2 g in sodium chloride 0.9 % 100 mL IVPB        2 g 200 mL/hr over 30 Minutes Intravenous  Once 12/22/22 1756 12/22/22 1950         Subjective: Patient sleeping deeply.  Just returned from bone marrow biopsy.  Per RN patient having bowel movements and good urine output.    Objective: Vitals:   01/06/23 0820 01/06/23 0847 01/06/23 1101 01/06/23 1152  BP: (!) 139/55 (!) 131/59 (!) 145/67 (!) 149/60  Pulse: 93 77 77 67  Resp: 18 20 20 18   Temp:  98.2 F (36.8 C) 98.3 F (36.8 C) 98.4 F (36.9 C)  TempSrc:  Oral Oral Oral  SpO2: 100% 97% 97% 96%  Weight:      Height:        Intake/Output Summary (Last 24 hours) at 01/06/2023 1244 Last data filed at 01/06/2023 1114 Gross per 24 hour  Intake 676 ml  Output 900 ml  Net -224 ml   Filed Weights   01/04/23 0500 01/05/23 0500 01/06/23 0518  Weight: 59 kg 59.1 kg 57.9 kg    Examination:  General exam: NAD Respiratory system: Lungs clear to auscultation bilaterally anterior lung fields.  No wheezes, no crackles, no rhonchi.  Fair air movement.  Speaking in full sentences.  Cardiovascular system: RRR no murmurs rubs or gallops.  No JVD.  No lower extremity edema.  Gastrointestinal system: Abdomen is soft, nontender, nondistended, positive bowel sounds.  No rebound.  No guarding.   Central nervous system: Alert and oriented.  Moving extremities spontaneously.  No focal neurological deficits. Extremities: Symmetric 5 x 5 power. Skin: No rashes, lesions or ulcers Psychiatry: Judgement and insight appear normal. Mood & affect  appropriate.     Data Reviewed: I have personally reviewed following labs and imaging studies  CBC: Recent Labs  Lab 01/02/23 0413 01/03/23 0440 01/04/23 0447 01/05/23 0424 01/06/23 0926  WBC 7.8 8.4 7.2 6.8 6.6  NEUTROABS  --  6.4  --  4.3 4.7  HGB 9.2* 9.6* 9.1* 8.8* 8.7*  HCT 28.7* 30.4* 28.0* 27.4* 28.0*  MCV 93.2 93.8 94.0 94.2 95.6  PLT 206 228 235 226 242    Basic Metabolic Panel: Recent Labs  Lab 01/01/23 0416 01/02/23 0413 01/03/23 0440 01/04/23 0447 01/05/23 0424 01/06/23 0926  NA 136 136 132* 137 136 134*  K 3.1* 3.1* 3.3* 3.5 3.4* 3.6  CL 109 111 106 108 109 106  CO2 21* 22 21* 23 24 22   GLUCOSE 102* 101* 104* 100* 105* 115*  BUN 15 12 10 19 19 14   CREATININE 0.67 0.63 0.64 0.72 0.71 0.62  CALCIUM 11.2* 11.1* 10.7* 11.5* 10.6* 9.9  MG 2.1 1.8 1.9 1.8 1.8 1.9  PHOS 2.7  --  2.8 3.4 3.0 2.0*    GFR: Estimated Creatinine Clearance: 47.1 mL/min (by C-G formula based on SCr of 0.62 mg/dL).  Liver Function Tests: Recent Labs  Lab 01/01/23 0416 01/03/23 0440 01/04/23 0447 01/05/23 0424 01/06/23 0926  ALBUMIN 2.4* 2.5* 2.6* 2.3* 2.5*    CBG: Recent Labs  Lab 01/05/23 1150 01/05/23 1650 01/05/23 2059 01/06/23 0843 01/06/23 1154  GLUCAP 111* 137* 132* 115* 116*     No results found for this or any previous visit (from the past 240 hour(s)).        Radiology Studies: CT BONE MARROW BIOPSY & ASPIRATION  Result Date: 01/06/2023 INDICATION: 82 year old female with history of monoclonal gammopathy of unknown significance. EXAM: CT-GUIDED BONE MARROW BIOPSY AND ASPIRATION MEDICATIONS: None ANESTHESIA/SEDATION: Fentanyl 50 mcg IV; Versed 1 mg IV Sedation Time: 10 minutes; The patient was continuously monitored during the procedure by the interventional radiology nurse under my direct supervision. COMPLICATIONS: None immediate. PROCEDURE: Informed consent was obtained from the patient following an explanation of the procedure, risks, benefits  and alternatives. The patient understands, agrees and consents for the procedure. All questions were addressed. A time out was performed prior to the initiation of the procedure. The patient was positioned prone and non-contrast localization CT was performed of the pelvis to demonstrate the iliac marrow spaces. The operative site was prepped and draped in the usual sterile fashion. Under sterile conditions and local anesthesia, a 22 gauge spinal needle was utilized for procedural planning. Next, an 11 gauge coaxial bone biopsy needle was advanced into the right iliac marrow space. Needle position was confirmed with CT imaging. Initially, a bone marrow aspiration was performed. Next, a bone marrow biopsy was obtained with the 11 gauge outer bone marrow device. Samples were prepared with the cytotechnologist and deemed adequate. The needle was removed and superficial hemostasis was obtained with manual compression. A dressing was applied. The patient tolerated the procedure well without immediate post procedural complication. IMPRESSION: Successful CT guided right iliac bone marrow aspiration and core biopsy. Marliss Coots, MD [facility] Radiology Electronically Signed   By: Marliss Coots M.D.   On: 01/06/2023 09:22        Scheduled Meds:  allopurinol  300 mg Oral Daily   aspirin EC  81 mg Oral Daily   bisacodyl  10 mg Rectal Daily   enoxaparin (LOVENOX) injection  1 mg/kg Subcutaneous Q12H   feeding supplement  237 mL Oral BID BM   hydrALAZINE  100 mg Oral Q8H   insulin aspart  0-15 Units Subcutaneous TID WC   magnesium oxide  400 mg Oral Daily   pantoprazole  40 mg Oral Daily   phosphorus  250 mg Oral BID   polyethylene glycol  17 g Oral BID   senna-docusate  1 tablet Oral BID   sodium chloride flush  3 mL Intravenous Q12H   tamsulosin  0.4 mg Oral Daily   Continuous Infusions:      LOS: 15 days    Time spent: 35 minutes    Ramiro Harvest, MD [facility]   To contact the attending provider between 7A-7P or the covering provider during after hours 7P-7A, please log into the web site [email or web address] and access using universal [place] password for that web site. If you do not have the password, please call the hospital operator.  01/06/2023, 12:44 PM

## 2023-01-06 NOTE — Procedures (Signed)
Interventional Radiology Procedure Note  Procedure: CT guided aspirate and core biopsy of right iliac bone  Complications: None  Recommendations: - Bedrest supine x 1 hrs - Hydrocodone PRN  Pain - Follow biopsy results   Arel Tippen, MD   

## 2023-01-07 DIAGNOSIS — G9340 Encephalopathy, unspecified: Secondary | ICD-10-CM | POA: Diagnosis not present

## 2023-01-07 DIAGNOSIS — N179 Acute kidney failure, unspecified: Secondary | ICD-10-CM | POA: Diagnosis not present

## 2023-01-07 DIAGNOSIS — R338 Other retention of urine: Secondary | ICD-10-CM | POA: Diagnosis not present

## 2023-01-07 LAB — CBC
HCT: 29.3 % — ABNORMAL LOW (ref 36.0–46.0)
Hemoglobin: 8.8 g/dL — ABNORMAL LOW (ref 12.0–15.0)
MCH: 29.8 pg (ref 26.0–34.0)
MCHC: 30 g/dL (ref 30.0–36.0)
MCV: 99.3 fL (ref 80.0–100.0)
Platelets: 231 10*3/uL (ref 150–400)
RBC: 2.95 MIL/uL — ABNORMAL LOW (ref 3.87–5.11)
RDW: 14.7 % (ref 11.5–15.5)
WBC: 6.4 10*3/uL (ref 4.0–10.5)
nRBC: 0 % (ref 0.0–0.2)

## 2023-01-07 LAB — RENAL FUNCTION PANEL
Albumin: 2.5 g/dL — ABNORMAL LOW (ref 3.5–5.0)
Anion gap: 9 (ref 5–15)
BUN: 13 mg/dL (ref 8–23)
CO2: 19 mmol/L — ABNORMAL LOW (ref 22–32)
Calcium: 9.2 mg/dL (ref 8.9–10.3)
Chloride: 106 mmol/L (ref 98–111)
Creatinine, Ser: 0.65 mg/dL (ref 0.44–1.00)
GFR, Estimated: >60 mL/min (ref >60)
Glucose, Bld: 89 mg/dL (ref 70–99)
Phosphorus: 2.6 mg/dL (ref 2.5–4.6)
Potassium: 3.6 mmol/L (ref 3.5–5.1)
Sodium: 134 mmol/L — ABNORMAL LOW (ref 135–145)

## 2023-01-07 LAB — UPEP/UIFE/LIGHT CHAINS/TP, 24-HR UR
% BETA, Urine: 17.3 %
ALPHA 1 URINE: 5.6 %
Albumin, U: 57.4 %
Alpha 2, Urine: 7.1 %
Free Kappa Lt Chains,Ur: 54.96 mg/L (ref 1.17–86.46)
Free Kappa/Lambda Ratio: 11.67 (ref 1.83–14.26)
Free Lambda Lt Chains,Ur: 4.71 mg/L (ref 0.27–15.21)
GAMMA GLOBULIN URINE: 12.5 %
Total Protein, Urine-Ur/day: 475 mg/(24.h) — ABNORMAL HIGH (ref 30–150)
Total Protein, Urine: 28.8 mg/dL
Total Volume: 1650

## 2023-01-07 LAB — GLUCOSE, CAPILLARY
Glucose-Capillary: 102 mg/dL — ABNORMAL HIGH (ref 70–99)
Glucose-Capillary: 154 mg/dL — ABNORMAL HIGH (ref 70–99)
Glucose-Capillary: 158 mg/dL — ABNORMAL HIGH (ref 70–99)
Glucose-Capillary: 113 mg/dL — ABNORMAL HIGH (ref 70–99)

## 2023-01-07 LAB — MAGNESIUM: Magnesium: 2.2 mg/dL (ref 1.7–2.4)

## 2023-01-07 NOTE — TOC Progression Note (Signed)
Transition of Care [facility]) - Progression Note    Patient Details  Name: Camika Apicella MRN: [id number] Date of Birth: Dec 03, 1940  Transition of Care [facility]) CM/SW Contact  Larrie Kass, LCSW Phone Number: 01/07/2023, 10:18 AM  Clinical Narrative:     Pt's insurance [employer] is pending for SNF placement . TOC to follow.   Expected Discharge Plan: Skilled Nursing Facility   Expected Discharge Plan and Services       Living arrangements for the past 2 months: Skilled Nursing Facility Expected Discharge Date: 12/26/22                                     Social Determinants of Health (SDOH) Interventions SDOH Screenings   Food Insecurity: No Food Insecurity (12/22/2022)  Housing: Low Risk  (12/22/2022)  Transportation Needs: No Transportation Needs (12/22/2022)  Utilities: Not At Risk (12/22/2022)  Depression (PHQ2-9): Low Risk  (07/19/2019)  Tobacco Use: Medium Risk (01/04/2023)    Readmission Risk Interventions    04/28/2022    3:15 PM  Readmission Risk Prevention Plan  Transportation Screening Complete  PCP or Specialist Appt within 5-7 Days Complete  Home Care Screening Complete  Medication Review (RN CM) Complete

## 2023-01-07 NOTE — Progress Notes (Signed)
Subjective: Events overnight.  Patient sleeping on rounds.  I did not wake her.  Reviewed case and plan with nursing who confirmed that she was voiding completely and making progress with her bowel regimen.  Objective: Vital signs in last 24 hours: Temp:  [97.6 F (36.4 C)-98.4 F (36.9 C)] 97.6 F (36.4 C) (10/30 0504) Pulse Rate:  [67-77] 70 (10/30 0504) Resp:  [16-20] 16 (10/30 0504) BP: (140-153)/(58-67) 153/58 (10/30 0504) SpO2:  [95 %-97 %] 96 % (10/30 0504) Weight:  [58.2 kg] 58.2 kg (10/30 0520)  Assessment/Plan: # Mild bilateral hydronephrosis   Patient continuing aggressive bowel regimen and now having bowel movements.  Most recent postvoid residual was 0 mL.  No sign of obstruction at this time.  Her hydronephrosis was mild enough to where I do not think a repeat ultrasound is necessary.  Renal function is preserved. Continue as needed bladder scans    #renal mass vs cyst Numerous simple cysts.  A 1.9 x 1.6 cm lesion on the superior left kidney and a 3.8 x 3.3 cm complex appearing cyst on the right kidney.  These will need to be followed up with on an outpatient basis.  Urology will sign off at this time.  Please feel free to call with questions  Intake/Output from previous day: 10/29 0701 - 10/30 0700 In: 246 [P.O.:240; I.V.:6] Out: 1515 [Urine:1515]  Intake/Output this shift: No intake/output data recorded.  Physical Exam:  General: Sleeping CV: No cyanosis Lungs: equal chest rise   Lab Results: Recent Labs    01/05/23 0424 01/06/23 0926 01/07/23 0425  HGB 8.8* 8.7* 8.8*  HCT 27.4* 28.0* 29.3*   BMET Recent Labs    01/06/23 0926 01/07/23 0425  NA 134* 134*  K 3.6 3.6  CL 106 106  CO2 22 19*  GLUCOSE 115* 89  BUN 14 13  CREATININE 0.62 0.65  CALCIUM 9.9 9.2     Studies/Results: CT BONE MARROW BIOPSY & ASPIRATION  Result Date: 01/06/2023 INDICATION: 82 year old female with history of monoclonal gammopathy of unknown significance.  EXAM: CT-GUIDED BONE MARROW BIOPSY AND ASPIRATION MEDICATIONS: None ANESTHESIA/SEDATION: Fentanyl 50 mcg IV; Versed 1 mg IV Sedation Time: 10 minutes; The patient was continuously monitored during the procedure by the interventional radiology nurse under my direct supervision. COMPLICATIONS: None immediate. PROCEDURE: Informed consent was obtained from the patient following an explanation of the procedure, risks, benefits and alternatives. The patient understands, agrees and consents for the procedure. All questions were addressed. A time out was performed prior to the initiation of the procedure. The patient was positioned prone and non-contrast localization CT was performed of the pelvis to demonstrate the iliac marrow spaces. The operative site was prepped and draped in the usual sterile fashion. Under sterile conditions and local anesthesia, a 22 gauge spinal needle was utilized for procedural planning. Next, an 11 gauge coaxial bone biopsy needle was advanced into the right iliac marrow space. Needle position was confirmed with CT imaging. Initially, a bone marrow aspiration was performed. Next, a bone marrow biopsy was obtained with the 11 gauge outer bone marrow device. Samples were prepared with the cytotechnologist and deemed adequate. The needle was removed and superficial hemostasis was obtained with manual compression. A dressing was applied. The patient tolerated the procedure well without immediate post procedural complication. IMPRESSION: Successful CT guided right iliac bone marrow aspiration and core biopsy. Marliss Coots, MD [facility] Radiology Electronically Signed   By: Jae Dire.D.  On: 01/06/2023 09:22      LOS: 16 days   Elmon Kirschner, NP [facility] Pager: [phone]  01/07/2023, 9:59 AM

## 2023-01-07 NOTE — Progress Notes (Signed)
PROGRESS NOTE    Tina Frank  QIH:[id number] DOB: 12-Jun-1940 DOA: 12/22/2022 PCP: Sigmund Hazel, MD   Brief Narrative:  Hospital Course: Tina Frank is a 82 y.o. female with a history of diabetes mellitus type 2, hyperparathyroidism s/p parathyroidectomy, primary hypertension, chronic diastolic heart failure, CVA .  Patient presented secondary to worsening confusion and refusal to eat, drink or take medication and found to have acute metabolic encephalopathy secondary to UTI and complicated by associated AKI and hypercalcemia.    Assessment and Plan:  Acute Metabolic Encephalopathy -Felt presumed secondary to UTI in the setting of hypercalcemia. -Patient noted to have some improvement with mental status however some concerns for waxing and waning as he became quite confused again on 12/28/2022.??  Sundowning. -Improving slowly clinically. -TSH 1.499, free T41.46, vitamin B12 of 362, vitamin B1 100.6.  Ammonia level < 10. -MRI brain with no acute intracranial process, no evidence of acute or subacute infarct noted. -EEG done with mild to moderate diffuse encephalopathy, no seizures or epileptiform discharges were seen throughout the recording. -Continue supportive care with IV fluids. -PT/OT recommending SNF and awaiting Insurance Authorization   Hypercalcemia History of primary hyperparathyroidism SP parathyroidectomy in 2014 ??  Etiology of hypercalcemia. -Ca2+ Trend: Recent Labs  Lab 01/01/23 0416 01/02/23 0413 01/03/23 0440 01/04/23 0447 01/05/23 0424 01/06/23 0926 01/07/23 0425  CALCIUM 11.2* 11.1* 10.7* 11.5* 10.6* 9.9 9.2  -May be secondary to exogenous calcium from home calcium and vitamin D supplementation. -PTH at 14 appropriately suppressed, calcitriol levels noted low, vitamin D 25 hydroxy within normal limits.  TSH within normal limits. -PTH RP pending, UPEP pending. -Kappa lambda light chain ratio elevated at 2.64. -SPEP with 0.4% M spike which was 0.2% 2  months ago. -Patient seen in consultation by nephrology, placed on IV fluids with calcium level slowly trending down. -Corrected calcium at 11.1. -Nephrology recommending holding off on calcitonin and oral bisphosphonates early on in the hospitalization.  -Nephrology recommended input from hematology/oncology and as such hematology/oncology consulted. -Per hematology positive M protein spike has slightly elevated serum kappa light chain levels are not impressive and suspicion for multiple myeloma per hematology not extremely high. -Hematology oncology recommended bone marrow biopsy for further evaluation which was done this morning, 01/06/2023.  -CT abdomen and pelvis recommended by hematology/oncology negative for malignancy or metastatic disease however does note 1.9 x 1.6 cm lesion in the left kidney concerning for renal malignancy.  -Iron studies also ordered by hematology/oncology with a iron level of 30, TIBC of 210, ferritin of 76, folate of 11.7,.  Vitamin B12 of 362. -Multiple myeloma panel pending. -Nephrology in agreement with hematology for 1 dose of Zometa 4 mg which was given 01/04/2023.  -Calcium levels continue to trend down. -Will need outpatient follow-up with endocrinology. -Hematology/oncology, nephrology following and appreciate their input and recommendations. -Will not resume calcium and vitamin D supplementation on discharge. -Will also follow-up with Hematology/Oncology outpatient setting in 1 week.   Electrolyte Abnormalities Hypokalemia/hypomagnesemia/hypophosphatemia Recent Labs  Lab 01/01/23 0416 01/02/23 0413 01/03/23 0440 01/04/23 0447 01/05/23 0424 01/06/23 0926 01/07/23 0425  K 3.1* 3.1* 3.3* 3.5 3.4* 3.6 3.6  MG 2.1 1.8 1.9 1.8 1.8 1.9 2.2  PHOS 2.7  --  2.8 3.4 3.0 2.0* 2.6  -Continue to Monitor and Trend and repeat CMP in the AM   E. coli UTI -Was on IV Rocephin, transitioned to [facility] and completed 7-day course of treatment. -No further  antibiotics needed.  AKI -Felt likely secondary to a  prerenal azotemia in the setting of dehydration and hypercalcemia. -BUN/Cr Trend: Recent Labs  Lab 01/01/23 0416 01/02/23 0413 01/03/23 0440 01/04/23 0447 01/05/23 0424 01/06/23 0926 01/07/23 0425  BUN 15 12 10 19 19 14 13   CREATININE 0.67 0.63 0.64 0.72 0.71 0.62 0.65  -Renal function improved with hydration and was on NS and now stopped and currently resolved. -Saline lock IV fluids.   -Nephrology was following but has signed off.   -Avoid Nephrotoxic Medications, Contrast Dyes, Hypotension and Dehydration to Ensure Adequate Renal Perfusion and will need to Renally Adjust Meds -Continue to Monitor and Trend Renal Function carefully and repeat CMP in the AM   New onset Atrial Fibrillation -2D echo from 10/13/2022 with a EF of 60 to 65%, grade 1 DD and mildly dilated left atrium. -Patient with history of bradycardia arrhythmia for which she was sent to cardiology in May 2024. -Patient felt not a great candidate for long-term anticoagulation but fall risk now is lower due to debility per son. -Patient placed on Xarelto which is preferred medication per family. -Patient has been placed on full dose Lovenox in anticipation of bone marrow biopsy which was done 01/06/2023.   -Resuming Rivaroxaban today 01/07/2023.    Elevated Troponin -Currently asymptomatic. -Prior physician spoke with cardiology who reviewed the chart and felt no concerns for ACS.  Patient currently asymptomatic. -2D echo with normal EF, no significant valvular abnormality. -Follow up with Cardiology in the outpatient setting    Essential Hypertension -Patient noted to have been on clonidine, hydralazine in the outpatient setting which were held on admission. -BP still elevated and as such hydralazine increased to 100 mg 3 times daily. -Status post Lasix 20 mg IV x 1 on 01/03/2023. -Continue home regimen as needed Clonidine 0.1 mg po Dailyprn  -BP currently  controlled on current regimen of hydralazine. -If further blood pressure control is needed will start Amlodipine. -Last BP reading was 131/68   Diabetes mellitus type 2 -Hemoglobin A1c 6.0 (10/13/2022) -CBG Trend: Recent Labs  Lab 01/06/23 0843 01/06/23 1154 01/06/23 1602 01/06/23 2127 01/07/23 0734 01/07/23 1126 01/07/23 1610  GLUCAP 115* 116* 110* 123* 102* 154* 158*  -C/w Moderate Novolog SSI AC  Normocytic Anemia -Hgb/Hct Trend: Recent Labs  Lab 01/01/23 0416 01/02/23 0413 01/03/23 0440 01/04/23 0447 01/05/23 0424 01/06/23 0926 01/07/23 0425  HGB 9.3* 9.2* 9.6* 9.1* 8.8* 8.7* 8.8*  HCT 28.9* 28.7* 30.4* 28.0* 27.4* 28.0* 29.3*  MCV 94.4 93.2 93.8 94.0 94.2 95.6 99.3  -Check Anemia Panel in the AM -Continue to Monitor for S/Sx of Bleeding; No overt bleeding noted -Repeat CBC in the AM   Renal Mass versus numerous simple cysts Hydronephrosis Urinary retention -Noted on CT abdomen and pelvis. -1.9 x 1.6 cm lesion in the left kidney noted concerning for renal cell malignancy.  CT also concerning for mild bilateral hydronephrosis and hydroureter.  To the uterovesical junctions without calculus or other obstruction visualized.  Distended urinary bladder measuring up to 15.1 cm findings consistent with urinary retention and hydronephrosis secondary to back pressure. -Patient noted with a urine output of 900 over the past 24 hours, urine output not accurately recorded.. -Renal function currently stable. -Patient with minimal PVR. -Continue Tamsulosin, continue with bladder scans.  -Patient constipated and as such patient placed on bowel regimen which may be causing urinary retention per urology recommendations.  Her most recent postvoid residual is 0 mL and there is no signs of obstruction and they feel that her hydronephrosis was mild enough to  where they think that they do not need to repeat her ultrasound -Hematology/oncology to follow-up on lesion on left kidney in the  outpatient setting and likely would obtain an MRI for further evaluation. -Urology also to follow-up on kidney lesions in the outpatient setting. -Urology was following and has now signed off at this time as they feel the 0.9 x 1.6 cm lesion on the superior left kidney and a 3 x 8 x 3.3 cm complex appearing cyst on the right kidney will be need to followed up in outpatient basis  ? Vertigo -Patient stated on 01/01/2023, whenever she turns her head has a brief episode of spinning sensation which subsequently resolves on its own. -Patient does endorse a prior history of vertigo. -Clinically she is improved so will providing Meclizine 12/5 mg po TID as needed.   DVT prophylaxis:  Rivaroxaban (XARELTO) tablet 15 mg    Code Status: Limited: Do not attempt resuscitation (DNR) -DNR-LIMITED -Do Not Intubate/DNI  Family Communication: No family present at bedside  Disposition Plan:  Level of care: Telemetry Status is: Inpatient Remains inpatient appropriate because: Needs SNF and awaiting on insurance authorization   Consultants:  Nephrology: Dr. Thedore Mins 12/29/2022 Hematology/oncology: Dr. Mosetta Putt 01/02/2023 Urology:Cameron Sattenfield, NP  Procedures:  Chest x-ray 12/22/2022 MRI brain 12/28/2022 2D echo 12/24/2022 EEG 12/29/2022 CT abdomen and pelvis 01/03/2023 CT-guided aspirate and core biopsy of right iliac bone per IR: Dr. Elby Showers 01/06/2023  Antimicrobials:  Anti-infectives (From admission, onward)    Start     Dose/Rate Route Frequency Ordered Stop   12/26/22 0000  cefadroxil (DURICEF) 500 MG capsule  Status:  Discontinued        500 mg Oral 2 times daily 12/26/22 0938 01/06/23    12/25/22 1000  cefadroxil (DURICEF) capsule 500 mg        500 mg Oral 2 times daily 12/25/22 0845 12/29/22 2240   12/23/22 1800  cefTRIAXone (ROCEPHIN) 2 g in sodium chloride 0.9 % 100 mL IVPB  Status:  Discontinued        2 g 200 mL/hr over 30 Minutes Intravenous Every 24 hours 12/22/22 2127 12/25/22 0845    12/22/22 1800  cefTRIAXone (ROCEPHIN) 2 g in sodium chloride 0.9 % 100 mL IVPB        2 g 200 mL/hr over 30 Minutes Intravenous  Once 12/22/22 1756 12/22/22 1950       Subjective: Seen and examined at bedside she is sitting in the chair at bedside and states she feels okay.  Denied any nausea or vomiting.  Wanting to have a bowel movement.  No other concerns or complaints at this time.  Objective: Vitals:   01/06/23 2211 01/07/23 0504 01/07/23 0520 01/07/23 1329  BP: (!) 140/62 (!) 153/58  131/68  Pulse:  70  75  Resp:  16  14  Temp:  97.6 F (36.4 C)  98.2 F (36.8 C)  TempSrc:  Oral  Oral  SpO2:  96%  99%  Weight:   58.2 kg   Height:        Intake/Output Summary (Last 24 hours) at 01/07/2023 1733 Last data filed at 01/07/2023 1300 Gross per 24 hour  Intake 283 ml  Output 1515 ml  Net -1232 ml   Filed Weights   01/05/23 0500 01/06/23 0518 01/07/23 0520  Weight: 59.1 kg 57.9 kg 58.2 kg   Examination: Physical Exam:  Constitutional: Thin elderly chronically ill-appearing Caucasian female in no acute distress sitting in chair at bedside Respiratory: Diminished to auscultation  bilaterally, no wheezing, rales, rhonchi or crackles. Normal respiratory effort and patient is not tachypenic. No accessory muscle use.  Unlabored breathing Cardiovascular: RRR, no murmurs / rubs / gallops. S1 and S2 auscultated. No extremity edema.  Abdomen: Soft, non-tender, non-distended. Bowel sounds positive.  GU: Deferred. Musculoskeletal: No clubbing / cyanosis of digits/nails. No joint deformity upper and lower extremities.  Skin: No rashes, lesions, ulcers on limited skin evaluation. No induration; Warm and dry.  Neurologic: CN 2-12 grossly intact with no focal deficits. Romberg sign and cerebellar reflexes not assessed.   Data Reviewed: I have personally reviewed following labs and imaging studies  CBC: Recent Labs  Lab 01/03/23 0440 01/04/23 0447 01/05/23 0424 01/06/23 0926  01/07/23 0425  WBC 8.4 7.2 6.8 6.6 6.4  NEUTROABS 6.4  --  4.3 4.7  --   HGB 9.6* 9.1* 8.8* 8.7* 8.8*  HCT 30.4* 28.0* 27.4* 28.0* 29.3*  MCV 93.8 94.0 94.2 95.6 99.3  PLT 228 235 226 242 231   Basic Metabolic Panel: Recent Labs  Lab 01/03/23 0440 01/04/23 0447 01/05/23 0424 01/06/23 0926 01/07/23 0425  NA 132* 137 136 134* 134*  K 3.3* 3.5 3.4* 3.6 3.6  CL 106 108 109 106 106  CO2 21* 23 24 22  19*  GLUCOSE 104* 100* 105* 115* 89  BUN 10 19 19 14 13   CREATININE 0.64 0.72 0.71 0.62 0.65  CALCIUM 10.7* 11.5* 10.6* 9.9 9.2  MG 1.9 1.8 1.8 1.9 2.2  PHOS 2.8 3.4 3.0 2.0* 2.6   GFR: Estimated Creatinine Clearance: 47.1 mL/min (by C-G formula based on SCr of 0.65 mg/dL). Liver Function Tests: Recent Labs  Lab 01/03/23 0440 01/04/23 0447 01/05/23 0424 01/06/23 0926 01/07/23 0425  ALBUMIN 2.5* 2.6* 2.3* 2.5* 2.5*   No results for input(s): "LIPASE", "AMYLASE" in the last 168 hours. No results for input(s): "AMMONIA" in the last 168 hours. Coagulation Profile: No results for input(s): "INR", "PROTIME" in the last 168 hours. Cardiac Enzymes: No results for input(s): "CKTOTAL", "CKMB", "CKMBINDEX", "TROPONINI" in the last 168 hours. BNP (last 3 results) No results for input(s): "PROBNP" in the last 8760 hours. HbA1C: No results for input(s): "HGBA1C" in the last 72 hours. CBG: Recent Labs  Lab 01/06/23 1602 01/06/23 2127 01/07/23 0734 01/07/23 1126 01/07/23 1610  GLUCAP 110* 123* 102* 154* 158*   Lipid Profile: No results for input(s): "CHOL", "HDL", "LDLCALC", "TRIG", "CHOLHDL", "LDLDIRECT" in the last 72 hours. Thyroid Function Tests: No results for input(s): "TSH", "T4TOTAL", "FREET4", "T3FREE", "THYROIDAB" in the last 72 hours. Anemia Panel: No results for input(s): "VITAMINB12", "FOLATE", "FERRITIN", "TIBC", "IRON", "RETICCTPCT" in the last 72 hours. Sepsis Labs: No results for input(s): "PROCALCITON", "LATICACIDVEN" in the last 168 hours.  No results  found for this or any previous visit (from the past 240 hour(s)).   Radiology Studies: CT BONE MARROW BIOPSY & ASPIRATION  Result Date: 01/06/2023 INDICATION: 82 year old female with history of monoclonal gammopathy of unknown significance. EXAM: CT-GUIDED BONE MARROW BIOPSY AND ASPIRATION MEDICATIONS: None ANESTHESIA/SEDATION: Fentanyl 50 mcg IV; Versed 1 mg IV Sedation Time: 10 minutes; The patient was continuously monitored during the procedure by the interventional radiology nurse under my direct supervision. COMPLICATIONS: None immediate. PROCEDURE: Informed consent was obtained from the patient following an explanation of the procedure, risks, benefits and alternatives. The patient understands, agrees and consents for the procedure. All questions were addressed. A time out was performed prior to the initiation of the procedure. The patient was positioned prone and non-contrast localization CT was  performed of the pelvis to demonstrate the iliac marrow spaces. The operative site was prepped and draped in the usual sterile fashion. Under sterile conditions and local anesthesia, a 22 gauge spinal needle was utilized for procedural planning. Next, an 11 gauge coaxial bone biopsy needle was advanced into the right iliac marrow space. Needle position was confirmed with CT imaging. Initially, a bone marrow aspiration was performed. Next, a bone marrow biopsy was obtained with the 11 gauge outer bone marrow device. Samples were prepared with the cytotechnologist and deemed adequate. The needle was removed and superficial hemostasis was obtained with manual compression. A dressing was applied. The patient tolerated the procedure well without immediate post procedural complication. IMPRESSION: Successful CT guided right iliac bone marrow aspiration and core biopsy. Marliss Coots, MD [facility] Radiology Electronically Signed   By: Marliss Coots M.D.   On: 01/06/2023  09:22    Scheduled Meds:  allopurinol  300 mg Oral Daily   aspirin EC  81 mg Oral Daily   bisacodyl  10 mg Rectal Daily   feeding supplement  237 mL Oral BID BM   hydrALAZINE  100 mg Oral Q8H   insulin aspart  0-15 Units Subcutaneous TID WC   magnesium oxide  400 mg Oral Daily   pantoprazole  40 mg Oral Daily   phosphorus  250 mg Oral BID   polyethylene glycol  17 g Oral BID   rivaroxaban  15 mg Oral Q breakfast   senna-docusate  1 tablet Oral BID   sodium chloride flush  3 mL Intravenous Q12H   tamsulosin  0.4 mg Oral Daily   Continuous Infusions:   LOS: 16 days   Marguerita Merles, DO [facility] Available via Epic secure chat 7am-7pm After these hours, please refer to coverage provider listed on [email or web address] 01/07/2023, 5:33 PM

## 2023-01-08 ENCOUNTER — Ambulatory Visit: Payer: Medicare PPO | Admitting: Podiatry

## 2023-01-08 DIAGNOSIS — R1311 Dysphagia, oral phase: Secondary | ICD-10-CM | POA: Diagnosis not present

## 2023-01-08 DIAGNOSIS — Z0000 Encounter for general adult medical examination without abnormal findings: Secondary | ICD-10-CM | POA: Diagnosis not present

## 2023-01-08 DIAGNOSIS — R338 Other retention of urine: Secondary | ICD-10-CM | POA: Diagnosis not present

## 2023-01-08 DIAGNOSIS — I69391 Dysphagia following cerebral infarction: Secondary | ICD-10-CM | POA: Diagnosis not present

## 2023-01-08 DIAGNOSIS — I5032 Chronic diastolic (congestive) heart failure: Secondary | ICD-10-CM | POA: Diagnosis not present

## 2023-01-08 DIAGNOSIS — N179 Acute kidney failure, unspecified: Secondary | ICD-10-CM | POA: Diagnosis not present

## 2023-01-08 DIAGNOSIS — I4891 Unspecified atrial fibrillation: Secondary | ICD-10-CM | POA: Diagnosis not present

## 2023-01-08 DIAGNOSIS — Z66 Do not resuscitate: Secondary | ICD-10-CM | POA: Diagnosis not present

## 2023-01-08 DIAGNOSIS — H903 Sensorineural hearing loss, bilateral: Secondary | ICD-10-CM | POA: Diagnosis not present

## 2023-01-08 DIAGNOSIS — Z87898 Personal history of other specified conditions: Secondary | ICD-10-CM | POA: Diagnosis not present

## 2023-01-08 DIAGNOSIS — J029 Acute pharyngitis, unspecified: Secondary | ICD-10-CM | POA: Diagnosis not present

## 2023-01-08 DIAGNOSIS — Z7982 Long term (current) use of aspirin: Secondary | ICD-10-CM | POA: Diagnosis not present

## 2023-01-08 DIAGNOSIS — R1111 Vomiting without nausea: Secondary | ICD-10-CM | POA: Diagnosis not present

## 2023-01-08 DIAGNOSIS — K5901 Slow transit constipation: Secondary | ICD-10-CM | POA: Diagnosis not present

## 2023-01-08 DIAGNOSIS — I10 Essential (primary) hypertension: Secondary | ICD-10-CM | POA: Diagnosis not present

## 2023-01-08 DIAGNOSIS — B351 Tinea unguium: Secondary | ICD-10-CM | POA: Diagnosis not present

## 2023-01-08 DIAGNOSIS — Z7401 Bed confinement status: Secondary | ICD-10-CM | POA: Diagnosis not present

## 2023-01-08 DIAGNOSIS — N2889 Other specified disorders of kidney and ureter: Secondary | ICD-10-CM | POA: Diagnosis not present

## 2023-01-08 DIAGNOSIS — R2689 Other abnormalities of gait and mobility: Secondary | ICD-10-CM | POA: Diagnosis not present

## 2023-01-08 DIAGNOSIS — K219 Gastro-esophageal reflux disease without esophagitis: Secondary | ICD-10-CM | POA: Diagnosis not present

## 2023-01-08 DIAGNOSIS — D649 Anemia, unspecified: Secondary | ICD-10-CM | POA: Diagnosis not present

## 2023-01-08 DIAGNOSIS — N390 Urinary tract infection, site not specified: Secondary | ICD-10-CM | POA: Diagnosis not present

## 2023-01-08 DIAGNOSIS — R531 Weakness: Secondary | ICD-10-CM | POA: Diagnosis not present

## 2023-01-08 DIAGNOSIS — I639 Cerebral infarction, unspecified: Secondary | ICD-10-CM | POA: Diagnosis not present

## 2023-01-08 DIAGNOSIS — Z8673 Personal history of transient ischemic attack (TIA), and cerebral infarction without residual deficits: Secondary | ICD-10-CM | POA: Diagnosis not present

## 2023-01-08 DIAGNOSIS — E119 Type 2 diabetes mellitus without complications: Secondary | ICD-10-CM | POA: Diagnosis not present

## 2023-01-08 DIAGNOSIS — R001 Bradycardia, unspecified: Secondary | ICD-10-CM | POA: Diagnosis not present

## 2023-01-08 DIAGNOSIS — M810 Age-related osteoporosis without current pathological fracture: Secondary | ICD-10-CM | POA: Diagnosis not present

## 2023-01-08 DIAGNOSIS — L7622 Postprocedural hemorrhage and hematoma of skin and subcutaneous tissue following other procedure: Secondary | ICD-10-CM | POA: Diagnosis not present

## 2023-01-08 DIAGNOSIS — K1379 Other lesions of oral mucosa: Secondary | ICD-10-CM | POA: Diagnosis present

## 2023-01-08 DIAGNOSIS — D509 Iron deficiency anemia, unspecified: Secondary | ICD-10-CM | POA: Diagnosis not present

## 2023-01-08 DIAGNOSIS — K068 Other specified disorders of gingiva and edentulous alveolar ridge: Secondary | ICD-10-CM | POA: Diagnosis not present

## 2023-01-08 DIAGNOSIS — R2681 Unsteadiness on feet: Secondary | ICD-10-CM | POA: Diagnosis not present

## 2023-01-08 DIAGNOSIS — N1330 Unspecified hydronephrosis: Secondary | ICD-10-CM | POA: Diagnosis not present

## 2023-01-08 DIAGNOSIS — M79676 Pain in unspecified toe(s): Secondary | ICD-10-CM | POA: Diagnosis not present

## 2023-01-08 DIAGNOSIS — R7989 Other specified abnormal findings of blood chemistry: Secondary | ICD-10-CM | POA: Diagnosis not present

## 2023-01-08 DIAGNOSIS — D472 Monoclonal gammopathy: Secondary | ICD-10-CM | POA: Diagnosis not present

## 2023-01-08 DIAGNOSIS — M109 Gout, unspecified: Secondary | ICD-10-CM | POA: Diagnosis not present

## 2023-01-08 DIAGNOSIS — E876 Hypokalemia: Secondary | ICD-10-CM | POA: Diagnosis not present

## 2023-01-08 DIAGNOSIS — G9340 Encephalopathy, unspecified: Secondary | ICD-10-CM | POA: Diagnosis not present

## 2023-01-08 DIAGNOSIS — R7303 Prediabetes: Secondary | ICD-10-CM | POA: Diagnosis not present

## 2023-01-08 DIAGNOSIS — Z7901 Long term (current) use of anticoagulants: Secondary | ICD-10-CM | POA: Diagnosis not present

## 2023-01-08 LAB — CBC WITH DIFFERENTIAL/PLATELET
Abs Immature Granulocytes: 0.05 10*3/uL (ref 0.00–0.07)
Basophils Absolute: 0 10*3/uL (ref 0.0–0.1)
Basophils Relative: 1 %
Eosinophils Absolute: 0.2 10*3/uL (ref 0.0–0.5)
Eosinophils Relative: 3 %
HCT: 29.3 % — ABNORMAL LOW (ref 36.0–46.0)
Hemoglobin: 9.2 g/dL — ABNORMAL LOW (ref 12.0–15.0)
Immature Granulocytes: 1 %
Lymphocytes Relative: 13 %
Lymphs Abs: 1.1 10*3/uL (ref 0.7–4.0)
MCH: 30.4 pg (ref 26.0–34.0)
MCHC: 31.4 g/dL (ref 30.0–36.0)
MCV: 96.7 fL (ref 80.0–100.0)
Monocytes Absolute: 0.9 10*3/uL (ref 0.1–1.0)
Monocytes Relative: 10 %
Neutro Abs: 6.5 10*3/uL (ref 1.7–7.7)
Neutrophils Relative %: 72 %
Platelets: 254 10*3/uL (ref 150–400)
RBC: 3.03 MIL/uL — ABNORMAL LOW (ref 3.87–5.11)
RDW: 14.7 % (ref 11.5–15.5)
WBC: 8.8 10*3/uL (ref 4.0–10.5)
nRBC: 0 % (ref 0.0–0.2)

## 2023-01-08 LAB — COMPREHENSIVE METABOLIC PANEL
ALT: 11 U/L (ref 0–44)
AST: 17 U/L (ref 15–41)
Albumin: 2.7 g/dL — ABNORMAL LOW (ref 3.5–5.0)
Alkaline Phosphatase: 64 U/L (ref 38–126)
Anion gap: 7 (ref 5–15)
BUN: 13 mg/dL (ref 8–23)
CO2: 22 mmol/L (ref 22–32)
Calcium: 9.1 mg/dL (ref 8.9–10.3)
Chloride: 106 mmol/L (ref 98–111)
Creatinine, Ser: 0.61 mg/dL (ref 0.44–1.00)
GFR, Estimated: >60 mL/min (ref >60)
Glucose, Bld: 95 mg/dL (ref 70–99)
Potassium: 3.1 mmol/L — ABNORMAL LOW (ref 3.5–5.1)
Sodium: 135 mmol/L (ref 135–145)
Total Bilirubin: 0.6 mg/dL (ref 0.3–1.2)
Total Protein: 5.6 g/dL — ABNORMAL LOW (ref 6.5–8.1)

## 2023-01-08 LAB — GLUCOSE, CAPILLARY
Glucose-Capillary: 118 mg/dL — ABNORMAL HIGH (ref 70–99)
Glucose-Capillary: 140 mg/dL — ABNORMAL HIGH (ref 70–99)
Glucose-Capillary: 107 mg/dL — ABNORMAL HIGH (ref 70–99)
Glucose-Capillary: 120 mg/dL — ABNORMAL HIGH (ref 70–99)

## 2023-01-08 LAB — PHOSPHORUS: Phosphorus: 2.2 mg/dL — ABNORMAL LOW (ref 2.5–4.6)

## 2023-01-08 LAB — MAGNESIUM: Magnesium: 2.4 mg/dL (ref 1.7–2.4)

## 2023-01-08 MED ORDER — POTASSIUM PHOSPHATES 15 MMOLE/5ML IV SOLN
20.0000 mmol | Freq: Once | INTRAVENOUS | Status: AC
  Administered 2023-01-08: 20 mmol via INTRAVENOUS
  Filled 2023-01-08: qty 6.67

## 2023-01-08 MED ORDER — POTASSIUM CHLORIDE CRYS ER 20 MEQ PO TBCR
40.0000 meq | EXTENDED_RELEASE_TABLET | Freq: Once | ORAL | Status: AC
  Administered 2023-01-08: 40 meq via ORAL
  Filled 2023-01-08: qty 2

## 2023-01-08 MED ORDER — K PHOS MONO-SOD PHOS DI & MONO 155-852-130 MG PO TABS
500.0000 mg | ORAL_TABLET | Freq: Once | ORAL | Status: AC
  Administered 2023-01-08: 500 mg via ORAL
  Filled 2023-01-08: qty 2

## 2023-01-08 NOTE — TOC Progression Note (Addendum)
Transition of Care [facility]) - Progression Note    Patient Details  Name: Tina Frank MRN: [id number] Date of Birth: 1941/03/03  [facility] CM/SW Contact  Larrie Kass, LCSW Phone Number: 01/08/2023, 10:04 AM  Clinical Narrative:    Pt's insurance [employer] was approved for short term rehab at [facility]. TOC to follow.    Expected Discharge Plan: Skilled Nursing Facility \  Expected Discharge Plan and Services       Living arrangements for the past 2 months: Skilled Nursing Facility Expected Discharge Date: 12/26/22                                     Social Determinants of Health (SDOH) Interventions SDOH Screenings   Food Insecurity: No Food Insecurity (12/22/2022)  Housing: Low Risk  (12/22/2022)  Transportation Needs: No Transportation Needs (12/22/2022)  Utilities: Not At Risk (12/22/2022)  Depression (PHQ2-9): Low Risk  (07/19/2019)  Tobacco Use: Medium Risk (01/04/2023)    Readmission Risk Interventions    04/28/2022    3:15 PM  Readmission Risk Prevention Plan  Transportation Screening Complete  PCP or Specialist Appt within 5-7 Days Complete  Home Care Screening Complete  Medication Review (RN CM) Complete

## 2023-01-08 NOTE — TOC Transition Note (Signed)
Transition of Care [facility]) - CM/SW Discharge Note   Patient Details  Name: Tina Frank MRN: [id number] Date of Birth: 10/17/1940  Transition of Care [facility]) CM/SW Contact:  Larrie Kass, LCSW Phone Number: 01/08/2023, 1:28 PM   Clinical Narrative:    Pt to d/c to [facility]. Pt's room [address], RN to call report. [phone] ext [phone]. PTAR called, no further TOC needs , TOC sign off.    Final next level of care: Skilled Nursing Facility Barriers to Discharge: No Barriers Identified   Patient Goals and CMS Choice CMS Medicare.gov Compare Post Acute Care list provided to:: Patient Choice offered to / list presented to : Patient  Discharge Placement                      Patient and family notified of of transfer: 01/08/23  Discharge Plan and Services Additional resources added to the After Visit Summary for                                       Social Determinants of Health (SDOH) Interventions SDOH Screenings   Food Insecurity: No Food Insecurity (12/22/2022)  Housing: Low Risk  (12/22/2022)  Transportation Needs: No Transportation Needs (12/22/2022)  Utilities: Not At Risk (12/22/2022)  Depression (PHQ2-9): Low Risk  (07/19/2019)  Tobacco Use: Medium Risk (01/04/2023)     Readmission Risk Interventions    04/28/2022    3:15 PM  Readmission Risk Prevention Plan  Transportation Screening Complete  PCP or Specialist Appt within 5-7 Days Complete  Home Care Screening Complete  Medication Review (RN CM) Complete

## 2023-01-08 NOTE — Discharge Summary (Signed)
Tina Frank   MR BRAIN WO CONTRAST  Result Date: 12/29/2022 CLINICAL DATA:  Altered mental status EXAM: MRI HEAD WITHOUT CONTRAST TECHNIQUE: Multiplanar, multiecho pulse sequences of the brain and surrounding structures were obtained without intravenous contrast. COMPARISON:  10/12/2022 FINDINGS: Brain: No restricted diffusion to suggest acute or subacute infarct. No acute hemorrhage, mass, mass effect, or midline shift. No hydrocephalus or extra-axial collection. Pituitary and craniocervical junction within normal limits. Redemonstrated hemosiderin staining in the right occipital lobe. Confluent T2 hyperintense signal in the periventricular white matter  and pons, likely the sequela of moderate chronic small vessel ischemic disease. Vascular: Normal arterial flow voids. Skull and upper cervical spine: Normal marrow signal. Sinuses/Orbits: Clear paranasal sinuses. No acute finding in the orbits. Status post bilateral lens replacements. Other: The mastoid air cells are well aerated. IMPRESSION: No acute intracranial process. No evidence of acute or subacute infarct. Electronically Signed   By: Wiliam Ke M.D.   On: 12/29/2022 01:42   ECHOCARDIOGRAM LIMITED  Result Date: 12/24/2022    ECHOCARDIOGRAM LIMITED REPORT   Patient Name:   Tina Frank Date of Exam: 12/24/2022 Medical Rec #:  [id number]       Height:       65.0 in Accession #:    [id number]      Weight:       125.7 lb Date of Birth:  10-02-1940       BSA:          1.624 m Patient Age:    82 years        BP:           160/63 mmHg Patient Gender: F               HR:           68 bpm. Exam Location:  Inpatient Procedure: Limited Echo, Limited Color Doppler and Cardiac Doppler Indications:    Atrial fibrillation  History:        Patient has prior history of Echocardiogram examinations, most                 recent 10/13/2022. Risk Factors:Hypertension, Dyslipidemia and                 Diabetes. CKD.  Sonographer:    Milda Smart Referring Phys: [id number] PRANAV M PATEL  Sonographer Comments: Suboptimal subcostal window. Image acquisition challenging due to respiratory motion. IMPRESSIONS  1. Left ventricular ejection fraction, by estimation, is 60 to 65%. The left ventricle has normal function.  2. Right ventricular systolic function is normal. The right ventricular size is normal. Tricuspid regurgitation signal is inadequate for assessing PA pressure.  3. The mitral valve is grossly normal. Trivial mitral valve regurgitation. No evidence of mitral stenosis.  4. The aortic valve is tricuspid. There is mild calcification of the aortic valve. Aortic valve regurgitation is trivial. Aortic valve sclerosis is  present, with no evidence of aortic valve stenosis.  5. The inferior vena cava is normal in size with greater than 50% respiratory variability, suggesting right atrial pressure of 3 mmHg. Comparison(s): No significant change from prior study. FINDINGS  Left Ventricle: Left ventricular ejection fraction, by estimation, is 60 to 65%. The left ventricle has normal function. The left ventricular internal cavity size was normal in size. There is no left ventricular hypertrophy. Right Ventricle: The right ventricular size is normal. No increase in right ventricular wall thickness. Right ventricular systolic function is normal. Tricuspid regurgitation signal is inadequate for  Physician Discharge Summary   Patient: Tina Frank MRN: [id number] DOB: 1940/08/17  Admit date:     12/22/2022  Discharge date: 01/08/23  Discharge Physician: Marguerita Merles, DO   PCP: Sigmund Hazel, MD   Recommendations at discharge:   Follow-up with PCP within 1 to 2 weeks and repeat CBC, CMP, mag, Phos within 1 week Follow-up with Urology in outpatient setting for bilateral hydronephrosis as well as renal mass versus cyst Follow-up with Hematology/Medical Oncology in the outpatient setting and follow-up on the biopsy results of the CT-guided aspirate and core biopsy of the right iliac bone Have an outpatient MRI done on renal lesion for further evaluation Follow-up with Nephrology in outpatient setting if necessary Follow-up with Cardiology in outpatient setting for atrial fibrillation and elevated troponin  Discharge Diagnoses: Principal Problem:   Acute encephalopathy Active Problems:   Hypercalcemia   UTI (urinary tract infection)   AKI (acute kidney injury) (HCC)   Atrial fibrillation (HCC)   Elevated troponin   Chronic heart failure with preserved ejection fraction (HFpEF) (HCC)   Renal mass   Bilateral hydronephrosis   Acute urinary retention  Resolved Problems:   * No resolved hospital problems. [facility] Course: Tina Frank is a 82 y.o. female with a history of diabetes mellitus type 2, hyperparathyroidism s/p parathyroidectomy, primary hypertension, chronic diastolic heart failure, CVA .  Patient presented secondary to worsening confusion and refusal to eat, drink or take medication and found to have acute metabolic encephalopathy secondary to UTI and complicated by associated AKI and hypercalcemia.  Subsequently she improved and ended up getting a bone marrow biopsy to evaluate for her hypercalcemia.  She is stable and can be followed in outpatient setting with PCP, urology, nephrology, hematology and medical oncology as well as cardiology.  She will need  continued strength training at SNF and follow-up on her bone marrow biopsy that was done 01/06/23.   Assessment and Plan:  Acute Metabolic Encephalopathy, improved  -Felt presumed secondary to UTI in the setting of hypercalcemia. -Patient noted to have some improvement with mental status however some concerns for waxing and waning as he became quite confused again on 12/28/2022.??  Sundowning. -Improving slowly clinically and appears baseline. -TSH 1.499, free T41.46, vitamin B12 of 362, vitamin B1 100.6.  Ammonia level < 10. -MRI brain with no acute intracranial process, no evidence of acute or subacute infarct noted. -EEG done with mild to moderate diffuse encephalopathy, no seizures or epileptiform discharges were seen throughout the recording. -Continued supportive care with IV fluids but now IVF stopped. -PT/OT recommending SNF and and stable for D/C   Hypercalcemia History of primary hyperparathyroidism SP parathyroidectomy in 2014 ??  Etiology of hypercalcemia. -Ca2+ Trend: Recent Labs  Lab 01/02/23 0413 01/03/23 0440 01/04/23 0447 01/05/23 0424 01/06/23 0926 01/07/23 0425 01/08/23 0413  CALCIUM 11.1* 10.7* 11.5* 10.6* 9.9 9.2 9.1  -May be secondary to exogenous calcium from home calcium and vitamin D supplementation. -PTH at 14 appropriately suppressed, calcitriol levels noted low, vitamin D 25 hydroxy within normal limits.  TSH within normal limits. -PTH RP was <2.0, UPEP pending. -Kappa lambda light chain ratio elevated at 2.64. 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 -Patient seen in consultation by nephrology, placed on IV fluids with calcium level slowly trending down. -Corrected calcium at 10.1. -Nephrology recommending holding off on calcitonin and oral bisphosphonates early on in the hospitalization.  -  Physician Discharge Summary   Patient: Tina Frank MRN: [id number] DOB: 1940/08/17  Admit date:     12/22/2022  Discharge date: 01/08/23  Discharge Physician: Marguerita Merles, DO   PCP: Sigmund Hazel, MD   Recommendations at discharge:   Follow-up with PCP within 1 to 2 weeks and repeat CBC, CMP, mag, Phos within 1 week Follow-up with Urology in outpatient setting for bilateral hydronephrosis as well as renal mass versus cyst Follow-up with Hematology/Medical Oncology in the outpatient setting and follow-up on the biopsy results of the CT-guided aspirate and core biopsy of the right iliac bone Have an outpatient MRI done on renal lesion for further evaluation Follow-up with Nephrology in outpatient setting if necessary Follow-up with Cardiology in outpatient setting for atrial fibrillation and elevated troponin  Discharge Diagnoses: Principal Problem:   Acute encephalopathy Active Problems:   Hypercalcemia   UTI (urinary tract infection)   AKI (acute kidney injury) (HCC)   Atrial fibrillation (HCC)   Elevated troponin   Chronic heart failure with preserved ejection fraction (HFpEF) (HCC)   Renal mass   Bilateral hydronephrosis   Acute urinary retention  Resolved Problems:   * No resolved hospital problems. [facility] Course: Tina Frank is a 82 y.o. female with a history of diabetes mellitus type 2, hyperparathyroidism s/p parathyroidectomy, primary hypertension, chronic diastolic heart failure, CVA .  Patient presented secondary to worsening confusion and refusal to eat, drink or take medication and found to have acute metabolic encephalopathy secondary to UTI and complicated by associated AKI and hypercalcemia.  Subsequently she improved and ended up getting a bone marrow biopsy to evaluate for her hypercalcemia.  She is stable and can be followed in outpatient setting with PCP, urology, nephrology, hematology and medical oncology as well as cardiology.  She will need  continued strength training at SNF and follow-up on her bone marrow biopsy that was done 01/06/23.   Assessment and Plan:  Acute Metabolic Encephalopathy, improved  -Felt presumed secondary to UTI in the setting of hypercalcemia. -Patient noted to have some improvement with mental status however some concerns for waxing and waning as he became quite confused again on 12/28/2022.??  Sundowning. -Improving slowly clinically and appears baseline. -TSH 1.499, free T41.46, vitamin B12 of 362, vitamin B1 100.6.  Ammonia level < 10. -MRI brain with no acute intracranial process, no evidence of acute or subacute infarct noted. -EEG done with mild to moderate diffuse encephalopathy, no seizures or epileptiform discharges were seen throughout the recording. -Continued supportive care with IV fluids but now IVF stopped. -PT/OT recommending SNF and and stable for D/C   Hypercalcemia History of primary hyperparathyroidism SP parathyroidectomy in 2014 ??  Etiology of hypercalcemia. -Ca2+ Trend: Recent Labs  Lab 01/02/23 0413 01/03/23 0440 01/04/23 0447 01/05/23 0424 01/06/23 0926 01/07/23 0425 01/08/23 0413  CALCIUM 11.1* 10.7* 11.5* 10.6* 9.9 9.2 9.1  -May be secondary to exogenous calcium from home calcium and vitamin D supplementation. -PTH at 14 appropriately suppressed, calcitriol levels noted low, vitamin D 25 hydroxy within normal limits.  TSH within normal limits. -PTH RP was <2.0, UPEP pending. -Kappa lambda light chain ratio elevated at 2.64. 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 -Patient seen in consultation by nephrology, placed on IV fluids with calcium level slowly trending down. -Corrected calcium at 10.1. -Nephrology recommending holding off on calcitonin and oral bisphosphonates early on in the hospitalization.  -  Tina Frank   MR BRAIN WO CONTRAST  Result Date: 12/29/2022 CLINICAL DATA:  Altered mental status EXAM: MRI HEAD WITHOUT CONTRAST TECHNIQUE: Multiplanar, multiecho pulse sequences of the brain and surrounding structures were obtained without intravenous contrast. COMPARISON:  10/12/2022 FINDINGS: Brain: No restricted diffusion to suggest acute or subacute infarct. No acute hemorrhage, mass, mass effect, or midline shift. No hydrocephalus or extra-axial collection. Pituitary and craniocervical junction within normal limits. Redemonstrated hemosiderin staining in the right occipital lobe. Confluent T2 hyperintense signal in the periventricular white matter  and pons, likely the sequela of moderate chronic small vessel ischemic disease. Vascular: Normal arterial flow voids. Skull and upper cervical spine: Normal marrow signal. Sinuses/Orbits: Clear paranasal sinuses. No acute finding in the orbits. Status post bilateral lens replacements. Other: The mastoid air cells are well aerated. IMPRESSION: No acute intracranial process. No evidence of acute or subacute infarct. Electronically Signed   By: Wiliam Ke M.D.   On: 12/29/2022 01:42   ECHOCARDIOGRAM LIMITED  Result Date: 12/24/2022    ECHOCARDIOGRAM LIMITED REPORT   Patient Name:   Tina Frank Date of Exam: 12/24/2022 Medical Rec #:  [id number]       Height:       65.0 in Accession #:    [id number]      Weight:       125.7 lb Date of Birth:  10-02-1940       BSA:          1.624 m Patient Age:    82 years        BP:           160/63 mmHg Patient Gender: F               HR:           68 bpm. Exam Location:  Inpatient Procedure: Limited Echo, Limited Color Doppler and Cardiac Doppler Indications:    Atrial fibrillation  History:        Patient has prior history of Echocardiogram examinations, most                 recent 10/13/2022. Risk Factors:Hypertension, Dyslipidemia and                 Diabetes. CKD.  Sonographer:    Milda Smart Referring Phys: [id number] PRANAV M PATEL  Sonographer Comments: Suboptimal subcostal window. Image acquisition challenging due to respiratory motion. IMPRESSIONS  1. Left ventricular ejection fraction, by estimation, is 60 to 65%. The left ventricle has normal function.  2. Right ventricular systolic function is normal. The right ventricular size is normal. Tricuspid regurgitation signal is inadequate for assessing PA pressure.  3. The mitral valve is grossly normal. Trivial mitral valve regurgitation. No evidence of mitral stenosis.  4. The aortic valve is tricuspid. There is mild calcification of the aortic valve. Aortic valve regurgitation is trivial. Aortic valve sclerosis is  present, with no evidence of aortic valve stenosis.  5. The inferior vena cava is normal in size with greater than 50% respiratory variability, suggesting right atrial pressure of 3 mmHg. Comparison(s): No significant change from prior study. FINDINGS  Left Ventricle: Left ventricular ejection fraction, by estimation, is 60 to 65%. The left ventricle has normal function. The left ventricular internal cavity size was normal in size. There is no left ventricular hypertrophy. Right Ventricle: The right ventricular size is normal. No increase in right ventricular wall thickness. Right ventricular systolic function is normal. Tricuspid regurgitation signal is inadequate for  Physician Discharge Summary   Patient: Tina Frank MRN: [id number] DOB: 1940/08/17  Admit date:     12/22/2022  Discharge date: 01/08/23  Discharge Physician: Marguerita Merles, DO   PCP: Sigmund Hazel, MD   Recommendations at discharge:   Follow-up with PCP within 1 to 2 weeks and repeat CBC, CMP, mag, Phos within 1 week Follow-up with Urology in outpatient setting for bilateral hydronephrosis as well as renal mass versus cyst Follow-up with Hematology/Medical Oncology in the outpatient setting and follow-up on the biopsy results of the CT-guided aspirate and core biopsy of the right iliac bone Have an outpatient MRI done on renal lesion for further evaluation Follow-up with Nephrology in outpatient setting if necessary Follow-up with Cardiology in outpatient setting for atrial fibrillation and elevated troponin  Discharge Diagnoses: Principal Problem:   Acute encephalopathy Active Problems:   Hypercalcemia   UTI (urinary tract infection)   AKI (acute kidney injury) (HCC)   Atrial fibrillation (HCC)   Elevated troponin   Chronic heart failure with preserved ejection fraction (HFpEF) (HCC)   Renal mass   Bilateral hydronephrosis   Acute urinary retention  Resolved Problems:   * No resolved hospital problems. [facility] Course: Tina Frank is a 82 y.o. female with a history of diabetes mellitus type 2, hyperparathyroidism s/p parathyroidectomy, primary hypertension, chronic diastolic heart failure, CVA .  Patient presented secondary to worsening confusion and refusal to eat, drink or take medication and found to have acute metabolic encephalopathy secondary to UTI and complicated by associated AKI and hypercalcemia.  Subsequently she improved and ended up getting a bone marrow biopsy to evaluate for her hypercalcemia.  She is stable and can be followed in outpatient setting with PCP, urology, nephrology, hematology and medical oncology as well as cardiology.  She will need  continued strength training at SNF and follow-up on her bone marrow biopsy that was done 01/06/23.   Assessment and Plan:  Acute Metabolic Encephalopathy, improved  -Felt presumed secondary to UTI in the setting of hypercalcemia. -Patient noted to have some improvement with mental status however some concerns for waxing and waning as he became quite confused again on 12/28/2022.??  Sundowning. -Improving slowly clinically and appears baseline. -TSH 1.499, free T41.46, vitamin B12 of 362, vitamin B1 100.6.  Ammonia level < 10. -MRI brain with no acute intracranial process, no evidence of acute or subacute infarct noted. -EEG done with mild to moderate diffuse encephalopathy, no seizures or epileptiform discharges were seen throughout the recording. -Continued supportive care with IV fluids but now IVF stopped. -PT/OT recommending SNF and and stable for D/C   Hypercalcemia History of primary hyperparathyroidism SP parathyroidectomy in 2014 ??  Etiology of hypercalcemia. -Ca2+ Trend: Recent Labs  Lab 01/02/23 0413 01/03/23 0440 01/04/23 0447 01/05/23 0424 01/06/23 0926 01/07/23 0425 01/08/23 0413  CALCIUM 11.1* 10.7* 11.5* 10.6* 9.9 9.2 9.1  -May be secondary to exogenous calcium from home calcium and vitamin D supplementation. -PTH at 14 appropriately suppressed, calcitriol levels noted low, vitamin D 25 hydroxy within normal limits.  TSH within normal limits. -PTH RP was <2.0, UPEP pending. -Kappa lambda light chain ratio elevated at 2.64. 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 -Patient seen in consultation by nephrology, placed on IV fluids with calcium level slowly trending down. -Corrected calcium at 10.1. -Nephrology recommending holding off on calcitonin and oral bisphosphonates early on in the hospitalization.  -  Physician Discharge Summary   Patient: Tina Frank MRN: [id number] DOB: 1940/08/17  Admit date:     12/22/2022  Discharge date: 01/08/23  Discharge Physician: Marguerita Merles, DO   PCP: Sigmund Hazel, MD   Recommendations at discharge:   Follow-up with PCP within 1 to 2 weeks and repeat CBC, CMP, mag, Phos within 1 week Follow-up with Urology in outpatient setting for bilateral hydronephrosis as well as renal mass versus cyst Follow-up with Hematology/Medical Oncology in the outpatient setting and follow-up on the biopsy results of the CT-guided aspirate and core biopsy of the right iliac bone Have an outpatient MRI done on renal lesion for further evaluation Follow-up with Nephrology in outpatient setting if necessary Follow-up with Cardiology in outpatient setting for atrial fibrillation and elevated troponin  Discharge Diagnoses: Principal Problem:   Acute encephalopathy Active Problems:   Hypercalcemia   UTI (urinary tract infection)   AKI (acute kidney injury) (HCC)   Atrial fibrillation (HCC)   Elevated troponin   Chronic heart failure with preserved ejection fraction (HFpEF) (HCC)   Renal mass   Bilateral hydronephrosis   Acute urinary retention  Resolved Problems:   * No resolved hospital problems. [facility] Course: Tina Frank is a 82 y.o. female with a history of diabetes mellitus type 2, hyperparathyroidism s/p parathyroidectomy, primary hypertension, chronic diastolic heart failure, CVA .  Patient presented secondary to worsening confusion and refusal to eat, drink or take medication and found to have acute metabolic encephalopathy secondary to UTI and complicated by associated AKI and hypercalcemia.  Subsequently she improved and ended up getting a bone marrow biopsy to evaluate for her hypercalcemia.  She is stable and can be followed in outpatient setting with PCP, urology, nephrology, hematology and medical oncology as well as cardiology.  She will need  continued strength training at SNF and follow-up on her bone marrow biopsy that was done 01/06/23.   Assessment and Plan:  Acute Metabolic Encephalopathy, improved  -Felt presumed secondary to UTI in the setting of hypercalcemia. -Patient noted to have some improvement with mental status however some concerns for waxing and waning as he became quite confused again on 12/28/2022.??  Sundowning. -Improving slowly clinically and appears baseline. -TSH 1.499, free T41.46, vitamin B12 of 362, vitamin B1 100.6.  Ammonia level < 10. -MRI brain with no acute intracranial process, no evidence of acute or subacute infarct noted. -EEG done with mild to moderate diffuse encephalopathy, no seizures or epileptiform discharges were seen throughout the recording. -Continued supportive care with IV fluids but now IVF stopped. -PT/OT recommending SNF and and stable for D/C   Hypercalcemia History of primary hyperparathyroidism SP parathyroidectomy in 2014 ??  Etiology of hypercalcemia. -Ca2+ Trend: Recent Labs  Lab 01/02/23 0413 01/03/23 0440 01/04/23 0447 01/05/23 0424 01/06/23 0926 01/07/23 0425 01/08/23 0413  CALCIUM 11.1* 10.7* 11.5* 10.6* 9.9 9.2 9.1  -May be secondary to exogenous calcium from home calcium and vitamin D supplementation. -PTH at 14 appropriately suppressed, calcitriol levels noted low, vitamin D 25 hydroxy within normal limits.  TSH within normal limits. -PTH RP was <2.0, UPEP pending. -Kappa lambda light chain ratio elevated at 2.64. 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 -Patient seen in consultation by nephrology, placed on IV fluids with calcium level slowly trending down. -Corrected calcium at 10.1. -Nephrology recommending holding off on calcitonin and oral bisphosphonates early on in the hospitalization.  -  Physician Discharge Summary   Patient: Tina Frank MRN: [id number] DOB: 1940/08/17  Admit date:     12/22/2022  Discharge date: 01/08/23  Discharge Physician: Marguerita Merles, DO   PCP: Sigmund Hazel, MD   Recommendations at discharge:   Follow-up with PCP within 1 to 2 weeks and repeat CBC, CMP, mag, Phos within 1 week Follow-up with Urology in outpatient setting for bilateral hydronephrosis as well as renal mass versus cyst Follow-up with Hematology/Medical Oncology in the outpatient setting and follow-up on the biopsy results of the CT-guided aspirate and core biopsy of the right iliac bone Have an outpatient MRI done on renal lesion for further evaluation Follow-up with Nephrology in outpatient setting if necessary Follow-up with Cardiology in outpatient setting for atrial fibrillation and elevated troponin  Discharge Diagnoses: Principal Problem:   Acute encephalopathy Active Problems:   Hypercalcemia   UTI (urinary tract infection)   AKI (acute kidney injury) (HCC)   Atrial fibrillation (HCC)   Elevated troponin   Chronic heart failure with preserved ejection fraction (HFpEF) (HCC)   Renal mass   Bilateral hydronephrosis   Acute urinary retention  Resolved Problems:   * No resolved hospital problems. [facility] Course: Tina Frank is a 82 y.o. female with a history of diabetes mellitus type 2, hyperparathyroidism s/p parathyroidectomy, primary hypertension, chronic diastolic heart failure, CVA .  Patient presented secondary to worsening confusion and refusal to eat, drink or take medication and found to have acute metabolic encephalopathy secondary to UTI and complicated by associated AKI and hypercalcemia.  Subsequently she improved and ended up getting a bone marrow biopsy to evaluate for her hypercalcemia.  She is stable and can be followed in outpatient setting with PCP, urology, nephrology, hematology and medical oncology as well as cardiology.  She will need  continued strength training at SNF and follow-up on her bone marrow biopsy that was done 01/06/23.   Assessment and Plan:  Acute Metabolic Encephalopathy, improved  -Felt presumed secondary to UTI in the setting of hypercalcemia. -Patient noted to have some improvement with mental status however some concerns for waxing and waning as he became quite confused again on 12/28/2022.??  Sundowning. -Improving slowly clinically and appears baseline. -TSH 1.499, free T41.46, vitamin B12 of 362, vitamin B1 100.6.  Ammonia level < 10. -MRI brain with no acute intracranial process, no evidence of acute or subacute infarct noted. -EEG done with mild to moderate diffuse encephalopathy, no seizures or epileptiform discharges were seen throughout the recording. -Continued supportive care with IV fluids but now IVF stopped. -PT/OT recommending SNF and and stable for D/C   Hypercalcemia History of primary hyperparathyroidism SP parathyroidectomy in 2014 ??  Etiology of hypercalcemia. -Ca2+ Trend: Recent Labs  Lab 01/02/23 0413 01/03/23 0440 01/04/23 0447 01/05/23 0424 01/06/23 0926 01/07/23 0425 01/08/23 0413  CALCIUM 11.1* 10.7* 11.5* 10.6* 9.9 9.2 9.1  -May be secondary to exogenous calcium from home calcium and vitamin D supplementation. -PTH at 14 appropriately suppressed, calcitriol levels noted low, vitamin D 25 hydroxy within normal limits.  TSH within normal limits. -PTH RP was <2.0, UPEP pending. -Kappa lambda light chain ratio elevated at 2.64. 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 -Patient seen in consultation by nephrology, placed on IV fluids with calcium level slowly trending down. -Corrected calcium at 10.1. -Nephrology recommending holding off on calcitonin and oral bisphosphonates early on in the hospitalization.  -  Tina Frank   MR BRAIN WO CONTRAST  Result Date: 12/29/2022 CLINICAL DATA:  Altered mental status EXAM: MRI HEAD WITHOUT CONTRAST TECHNIQUE: Multiplanar, multiecho pulse sequences of the brain and surrounding structures were obtained without intravenous contrast. COMPARISON:  10/12/2022 FINDINGS: Brain: No restricted diffusion to suggest acute or subacute infarct. No acute hemorrhage, mass, mass effect, or midline shift. No hydrocephalus or extra-axial collection. Pituitary and craniocervical junction within normal limits. Redemonstrated hemosiderin staining in the right occipital lobe. Confluent T2 hyperintense signal in the periventricular white matter  and pons, likely the sequela of moderate chronic small vessel ischemic disease. Vascular: Normal arterial flow voids. Skull and upper cervical spine: Normal marrow signal. Sinuses/Orbits: Clear paranasal sinuses. No acute finding in the orbits. Status post bilateral lens replacements. Other: The mastoid air cells are well aerated. IMPRESSION: No acute intracranial process. No evidence of acute or subacute infarct. Electronically Signed   By: Wiliam Ke M.D.   On: 12/29/2022 01:42   ECHOCARDIOGRAM LIMITED  Result Date: 12/24/2022    ECHOCARDIOGRAM LIMITED REPORT   Patient Name:   Tina Frank Date of Exam: 12/24/2022 Medical Rec #:  [id number]       Height:       65.0 in Accession #:    [id number]      Weight:       125.7 lb Date of Birth:  10-02-1940       BSA:          1.624 m Patient Age:    82 years        BP:           160/63 mmHg Patient Gender: F               HR:           68 bpm. Exam Location:  Inpatient Procedure: Limited Echo, Limited Color Doppler and Cardiac Doppler Indications:    Atrial fibrillation  History:        Patient has prior history of Echocardiogram examinations, most                 recent 10/13/2022. Risk Factors:Hypertension, Dyslipidemia and                 Diabetes. CKD.  Sonographer:    Milda Smart Referring Phys: [id number] PRANAV M PATEL  Sonographer Comments: Suboptimal subcostal window. Image acquisition challenging due to respiratory motion. IMPRESSIONS  1. Left ventricular ejection fraction, by estimation, is 60 to 65%. The left ventricle has normal function.  2. Right ventricular systolic function is normal. The right ventricular size is normal. Tricuspid regurgitation signal is inadequate for assessing PA pressure.  3. The mitral valve is grossly normal. Trivial mitral valve regurgitation. No evidence of mitral stenosis.  4. The aortic valve is tricuspid. There is mild calcification of the aortic valve. Aortic valve regurgitation is trivial. Aortic valve sclerosis is  present, with no evidence of aortic valve stenosis.  5. The inferior vena cava is normal in size with greater than 50% respiratory variability, suggesting right atrial pressure of 3 mmHg. Comparison(s): No significant change from prior study. FINDINGS  Left Ventricle: Left ventricular ejection fraction, by estimation, is 60 to 65%. The left ventricle has normal function. The left ventricular internal cavity size was normal in size. There is no left ventricular hypertrophy. Right Ventricle: The right ventricular size is normal. No increase in right ventricular wall thickness. Right ventricular systolic function is normal. Tricuspid regurgitation signal is inadequate for  Tina Frank   MR BRAIN WO CONTRAST  Result Date: 12/29/2022 CLINICAL DATA:  Altered mental status EXAM: MRI HEAD WITHOUT CONTRAST TECHNIQUE: Multiplanar, multiecho pulse sequences of the brain and surrounding structures were obtained without intravenous contrast. COMPARISON:  10/12/2022 FINDINGS: Brain: No restricted diffusion to suggest acute or subacute infarct. No acute hemorrhage, mass, mass effect, or midline shift. No hydrocephalus or extra-axial collection. Pituitary and craniocervical junction within normal limits. Redemonstrated hemosiderin staining in the right occipital lobe. Confluent T2 hyperintense signal in the periventricular white matter  and pons, likely the sequela of moderate chronic small vessel ischemic disease. Vascular: Normal arterial flow voids. Skull and upper cervical spine: Normal marrow signal. Sinuses/Orbits: Clear paranasal sinuses. No acute finding in the orbits. Status post bilateral lens replacements. Other: The mastoid air cells are well aerated. IMPRESSION: No acute intracranial process. No evidence of acute or subacute infarct. Electronically Signed   By: Wiliam Ke M.D.   On: 12/29/2022 01:42   ECHOCARDIOGRAM LIMITED  Result Date: 12/24/2022    ECHOCARDIOGRAM LIMITED REPORT   Patient Name:   Tina Frank Date of Exam: 12/24/2022 Medical Rec #:  [id number]       Height:       65.0 in Accession #:    [id number]      Weight:       125.7 lb Date of Birth:  10-02-1940       BSA:          1.624 m Patient Age:    82 years        BP:           160/63 mmHg Patient Gender: F               HR:           68 bpm. Exam Location:  Inpatient Procedure: Limited Echo, Limited Color Doppler and Cardiac Doppler Indications:    Atrial fibrillation  History:        Patient has prior history of Echocardiogram examinations, most                 recent 10/13/2022. Risk Factors:Hypertension, Dyslipidemia and                 Diabetes. CKD.  Sonographer:    Milda Smart Referring Phys: [id number] PRANAV M PATEL  Sonographer Comments: Suboptimal subcostal window. Image acquisition challenging due to respiratory motion. IMPRESSIONS  1. Left ventricular ejection fraction, by estimation, is 60 to 65%. The left ventricle has normal function.  2. Right ventricular systolic function is normal. The right ventricular size is normal. Tricuspid regurgitation signal is inadequate for assessing PA pressure.  3. The mitral valve is grossly normal. Trivial mitral valve regurgitation. No evidence of mitral stenosis.  4. The aortic valve is tricuspid. There is mild calcification of the aortic valve. Aortic valve regurgitation is trivial. Aortic valve sclerosis is  present, with no evidence of aortic valve stenosis.  5. The inferior vena cava is normal in size with greater than 50% respiratory variability, suggesting right atrial pressure of 3 mmHg. Comparison(s): No significant change from prior study. FINDINGS  Left Ventricle: Left ventricular ejection fraction, by estimation, is 60 to 65%. The left ventricle has normal function. The left ventricular internal cavity size was normal in size. There is no left ventricular hypertrophy. Right Ventricle: The right ventricular size is normal. No increase in right ventricular wall thickness. Right ventricular systolic function is normal. Tricuspid regurgitation signal is inadequate for

## 2023-01-08 NOTE — Progress Notes (Addendum)
Patient discharged via [facility] at 2300 on 01/08/23 to [facility]. All Belongings given. [facility] supervisor [phone] "Phineas Semen" notified The patient's son "Jeannett Senior" also notified.

## 2023-01-09 ENCOUNTER — Telehealth: Payer: Self-pay

## 2023-01-09 LAB — PROTEIN ELECTROPHORESIS, SERUM
A/G Ratio: 1 (ref 0.7–1.7)
Albumin ELP: 2.6 g/dL — ABNORMAL LOW (ref 2.9–4.4)
Alpha-1-Globulin: 0.3 g/dL (ref 0.0–0.4)
Alpha-2-Globulin: 0.7 g/dL (ref 0.4–1.0)
Beta Globulin: 0.8 g/dL (ref 0.7–1.3)
Gamma Globulin: 0.9 g/dL (ref 0.4–1.8)
Globulin, Total: 2.6 g/dL (ref 2.2–3.9)
M-Spike, %: 0.4 g/dL — ABNORMAL HIGH
Total Protein ELP: 5.2 g/dL — ABNORMAL LOW (ref 6.0–8.5)

## 2023-01-09 LAB — SURGICAL PATHOLOGY

## 2023-01-09 NOTE — Telephone Encounter (Signed)
Katie Child psychotherapist from nursing facility called about giving a contact number for patient to be contacted for appointment.

## 2023-01-12 ENCOUNTER — Non-Acute Institutional Stay (SKILLED_NURSING_FACILITY): Payer: Medicare PPO | Admitting: Sports Medicine

## 2023-01-12 ENCOUNTER — Encounter: Payer: Self-pay | Admitting: Sports Medicine

## 2023-01-12 ENCOUNTER — Ambulatory Visit: Payer: Medicare PPO | Attending: Cardiology | Admitting: Cardiology

## 2023-01-12 DIAGNOSIS — R1311 Dysphagia, oral phase: Secondary | ICD-10-CM

## 2023-01-12 DIAGNOSIS — N2889 Other specified disorders of kidney and ureter: Secondary | ICD-10-CM

## 2023-01-12 DIAGNOSIS — D649 Anemia, unspecified: Secondary | ICD-10-CM | POA: Diagnosis not present

## 2023-01-12 DIAGNOSIS — I4891 Unspecified atrial fibrillation: Secondary | ICD-10-CM

## 2023-01-12 DIAGNOSIS — I10 Essential (primary) hypertension: Secondary | ICD-10-CM | POA: Diagnosis not present

## 2023-01-12 NOTE — Progress Notes (Signed)
Provider:  Dr Andree Coss Location:   [facility]   Place of Service:   Skilled care   PCP: Sigmund Hazel, MD Patient Care Team: Sigmund Hazel, MD as PCP - General (Family Medicine) Tessa Lerner, DO as PCP - Cardiology (Cardiology) Suzi Roots as Physician Assistant (Dermatology)  Extended Emergency Contact Information Primary Emergency Contact: [place], [place], [place] [address] [place] Home Phone: [phone] Work Phone: [phone] Mobile Phone: [phone] Relation: Son Secondary Emergency Contact: Scholes,[name]  [place] Phone: [phone] Relation: Son  Code Status:  Goals of Care: Advanced Directive information    12/22/2022    2:45 PM  Advanced Directives  Does Patient Have a Medical Advance Directive? No  Would patient like information on creating a medical advance directive? No - Patient declined      No chief complaint on file.   HPI: Patient is a 82 y.o. female seen today for admission to Skilled care   Pt seen in her room, she is sitting comfortably in her wheelchair watching TV  She knows her name, oriented to time, place, year  Pt states that her cardiology appt is being cancelled this morning, as her son wa snot aware of the appt it is rescheduled to a later date. Pt denies runny nose, cough, sob, abdominal pain, nausea, vomiting, dysuria, hematuria, bloody or dark stools. As per nursing staff pt refused all PM meds, reported having difficulty swallowing her meds one by one , states that it makes her gag and vomit.   Discharge Diagnoses: Principal Problem:   Acute encephalopathy Active Problems:   Hypercalcemia   UTI (urinary tract infection)   AKI (acute kidney injury) (HCC)   Atrial fibrillation (HCC)   Elevated troponin   Chronic heart failure with preserved ejection fraction (HFpEF) (HCC)   Renal mass   Bilateral hydronephrosis   Acute urinary  retention  Recommendations at discharge:    Follow-up with PCP within 1 to 2 weeks and repeat CBC, CMP, mag, Phos within 1 week Follow-up with Urology in outpatient setting for bilateral hydronephrosis as well as renal mass versus cyst Follow-up with Hematology/Medical Oncology in the outpatient setting and follow-up on the biopsy results of the CT-guided aspirate and core biopsy of the right iliac bone Have an outpatient MRI done on renal lesion for further evaluation Follow-up with Nephrology in outpatient setting if necessary Follow-up with Cardiology in outpatient setting for atrial fibrillation and elevated troponin  Past Medical History:  Diagnosis Date   CKD (chronic kidney disease), stage II    nephrologist-- dr Kathrene Bongo  [facility])   Diabetes mellitus type 2, diet-controlled (HCC)    followed by pcp---   (09-20-2019  per pt does not check blood sugar at home)   Fracture of humeral shaft, right, closed 04/25/2022   GERD (gastroesophageal reflux disease)    History of primary hyperparathyroidism    s/p  parathyroidectomy right inferior on 12-02-2012   Humerus fracture 04/25/2022   Hyperlipidemia    Hypertension    followd by nephrologist----  (09-20-2019  per pt had stress test done some time ago , unsure when/ where, thinks told ok)   OA (osteoarthritis)    Osteoporosis    Prosthetic joint infection (HCC)    followed by dr j. hatcher (ID)   left total shoulder arthroplasty 12/ 2016  ; 12/ 2020  revision with explant joint replacement and hemiarthroplasty;  completed 6 month  antibiotic 05/ 2021   Vertigo    Past Surgical History:  Procedure Laterality Date   COLONOSCOPY     DILATATION & CURETTAGE/HYSTEROSCOPY WITH MYOSURE N/A 09/28/2019   Procedure: DILATATION & CURETTAGE/HYSTEROSCOPY WITH MYOSURE;  Surgeon: Olivia Mackie, MD;  Location: [facility];  Service: Gynecology;  Laterality: N/A;   EYE SURGERY  yrs ago   laser surgery for retinal tear.   (unilateral , pt unsure which side)   ORIF HUMERUS FRACTURE Right 04/27/2022   Procedure: OPEN REDUCTION INTERNAL FIXATION (ORIF) PROXIMAL HUMERUS FRACTURE;  Surgeon: Bjorn Pippin, MD;  Location: [facility];  Service: Orthopedics;  Laterality: Right;   PARATHYROIDECTOMY N/A 12/02/2012   Procedure: PARATHYROIDECTOMY with frozen section ;  Surgeon: Velora Heckler, MD;  Location: [facility];  Service: General;  Laterality: N/A;   REVERSE SHOULDER ARTHROPLASTY Right 04/27/2022   Procedure: REVERSE SHOULDER ARTHROPLASTY;  Surgeon: Bjorn Pippin, MD;  Location: [facility];  Service: Orthopedics;  Laterality: Right;   SHOULDER INJECTION Left 07/27/2013   Procedure: SHOULDER INJECTION;  Surgeon: Loreta Ave, MD;  Location: [facility];  Service: Orthopedics;  Laterality: Left;   STERIOD INJECTION Right 02/14/2015   Procedure: RIGHT SHOULDER CORTISONE INJECTION;  Surgeon: Loreta Ave, MD;  Location: [facility];  Service: Orthopedics;  Laterality: Right;   TOTAL HIP ARTHROPLASTY Left 07/27/2013   Procedure: TOTAL HIP ARTHROPLASTY ANTERIOR APPROACH;  Surgeon: Loreta Ave, MD;  Location: [facility] OR;  Service: Orthopedics;  Laterality: Left;   TOTAL SHOULDER ARTHROPLASTY Left 02/14/2015   Procedure: LEFT TOTAL SHOULDER ARTHROPLASTY;  Surgeon: Loreta Ave, MD;  Location: [facility] OR;  Service: Orthopedics;  Laterality: Left;   TOTAL SHOULDER REVISION Left 02/09/2019   Procedure: TOTAL SHOULDER REVISION;  Surgeon: Bjorn Pippin, MD;  Location: [facility];  Service: Orthopedics;  Laterality: Left;    reports that she quit smoking about 35 years ago. Her smoking use included cigarettes. She started smoking about 55 years ago. She has a 20 pack-year smoking history. She has never used smokeless tobacco. She reports that she does not drink alcohol and does not use drugs. Social History   Socioeconomic History   Marital status: Widowed    Spouse name: Not on file   Number of children: Not on file   Years of education: Not on file   Highest  education level: Not on file  Occupational History   Not on file  Tobacco Use   Smoking status: Former    Current packs/day: 0.00    Average packs/day: 1 pack/day for 20.0 years (20.0 ttl pk-yrs)    Types: Cigarettes    Start date: 10/28/1967    Quit date: 10/28/1987    Years since quitting: 35.2   Smokeless tobacco: Never  Vaping Use   Vaping status: Never Used  Substance and Sexual Activity   Alcohol use: No   Drug use: Never   Sexual activity: Yes    Birth control/protection: Post-menopausal  Other Topics Concern   Not on file  Social History Narrative   Not on file   Social Determinants of Health   Financial Resource Strain: Not on file  Food Insecurity: No Food Insecurity (12/22/2022)   Hunger Vital Sign    Worried About Running Out of Food in the Last Year: Never true    Ran Out of Food in the Last Year: Never true  Transportation Needs: No Transportation Needs (12/22/2022)   PRAPARE - Administrator, Civil Service (Medical): No  Lack of Transportation (Non-Medical): No  Physical Activity: Not on file  Stress: Not on file  Social Connections: Not on file  Intimate Partner Violence: Not At Risk (12/22/2022)   Humiliation, Afraid, Rape, and Kick questionnaire    Fear of Current or Ex-Partner: No    Emotionally Abused: No    Physically Abused: No    Sexually Abused: No    Functional Status Survey:    Family History  Problem Relation Age of Onset   Heart failure Mother    Cancer Mother    Breast cancer Mother        in her 72s   Cancer Father     Health Maintenance  Topic Date Due   Medicare Annual Wellness (AWV)  Never done   OPHTHALMOLOGY EXAM  Never done   Diabetic kidney evaluation - Urine ACR  01/26/2014   Zoster Vaccines- Shingrix (2 of 2) 03/11/2019   FOOT EXAM  10/01/2022   INFLUENZA VACCINE  10/09/2022   COVID-19 Vaccine (4 - 2023-24 season) 11/09/2022   HEMOGLOBIN A1C  04/15/2023   Diabetic kidney evaluation - eGFR measurement   01/08/2024   DTaP/Tdap/Td (3 - Td or Tdap) 06/09/2027   Pneumonia Vaccine 86+ Years old  Completed   DEXA SCAN  Completed   HPV VACCINES  Aged Out    Allergies  Allergen Reactions   Abaloparatide Other (See Comments)    "Burred vision, lightheaded" (patient does not recall this in 2024)   Mobic [Meloxicam] Hypertension   Crestor [Rosuvastatin] Other (See Comments)    Muscle aches   Norvasc [Amlodipine Besylate] Swelling and Other (See Comments)    Ankle swelling   Simvastatin Other (See Comments)    Muscle aches  Other Reaction(s): high LFTs   Sulfa Antibiotics Rash   Zetia [Ezetimibe] Other (See Comments)    Muscle aches    Outpatient Encounter Medications as of 01/12/2023  Medication Sig   allopurinol (ZYLOPRIM) 300 MG tablet Take 300 mg by mouth daily.   aspirin EC 81 MG tablet Take 1 tablet (81 mg total) by mouth daily. Swallow whole.   bisacodyl (DULCOLAX) 10 MG suppository Place 1 suppository (10 mg total) rectally daily for 7 days.   cloNIDine (CATAPRES) 0.1 MG tablet Take 0.1 mg by mouth daily as needed (for hypertension- if systolic b/p is over 160).   feeding supplement (ENSURE ENLIVE / ENSURE PLUS) LIQD Take 237 mLs by mouth 2 (two) times daily between meals.   hydrALAZINE (APRESOLINE) 100 MG tablet Take 1 tablet (100 mg total) by mouth 3 (three) times daily.   magnesium oxide (MAG-OX) 400 (240 Mg) MG tablet Take 1 tablet (400 mg total) by mouth daily.   meclizine (ANTIVERT) 12.5 MG tablet Take 1 tablet (12.5 mg total) by mouth 3 (three) times daily as needed for dizziness.   pantoprazole (PROTONIX) 40 MG tablet Take 1 tablet (40 mg total) by mouth 2 (two) times daily before a meal. Take 40 mg by mouth in the morning and at 5 PM- BEFORE FOOD   polyethylene glycol (MIRALAX / GLYCOLAX) 17 g packet Take 17 g by mouth 2 (two) times daily.   Rivaroxaban (XARELTO) 15 MG TABS tablet Take 1 tablet (15 mg total) by mouth daily with supper.   senna-docusate (SENOKOT-S) 8.6-50  MG tablet Take 1 tablet by mouth 2 (two) times daily.   tamsulosin (FLOMAX) 0.4 MG CAPS capsule Take 1 capsule (0.4 mg total) by mouth daily.   No facility-administered encounter medications on file as  of 01/12/2023.    Review of Systems  There were no vitals filed for this visit. There is no height or weight on file to calculate BMI. Physical Exam Constitutional:      Appearance: Normal appearance.  HENT:     Head: Normocephalic and atraumatic.  Cardiovascular:     Rate and Rhythm: Normal rate and regular rhythm.  Pulmonary:     Effort: Pulmonary effort is normal. No respiratory distress.     Breath sounds: Normal breath sounds. No wheezing.  Abdominal:     General: Bowel sounds are normal. There is no distension.     Tenderness: There is no abdominal tenderness. There is no guarding or rebound.  Musculoskeletal:        General: No swelling or tenderness.  Neurological:     Mental Status: She is alert. Mental status is at baseline.     Sensory: No sensory deficit.     Motor: No weakness.     Labs reviewed: Basic Metabolic Panel: Recent Labs    01/06/23 0926 01/07/23 0425 01/08/23 0413  NA 134* 134* 135  K 3.6 3.6 3.1*  CL 106 106 106  CO2 22 19* 22  GLUCOSE 115* 89 95  BUN 14 13 13   CREATININE 0.62 0.65 0.61  CALCIUM 9.9 9.2 9.1  MG 1.9 2.2 2.4  PHOS 2.0* 2.6 2.2*   Liver Function Tests: Recent Labs    10/13/22 0738 12/24/22 0347 12/28/22 1256 01/01/23 0416 01/06/23 0926 01/07/23 0425 01/08/23 0413  AST 14*  --  17  --   --   --  17  ALT 10  --  14  --   --   --  11  ALKPHOS 56  --  85  --   --   --  64  BILITOT 1.2  --  0.8  --   --   --  0.6  PROT 5.1*  --  6.5  --   --   --  5.6*  ALBUMIN 2.6*   < > 3.0*   < > 2.5* 2.5* 2.7*   < > = values in this interval not displayed.   Recent Labs    10/12/22 2013  LIPASE 26   Recent Labs    10/12/22 2104 12/28/22 1256  AMMONIA <10 <10   CBC: Recent Labs    01/05/23 0424 01/06/23 0926  01/07/23 0425 01/08/23 0413  WBC 6.8 6.6 6.4 8.8  NEUTROABS 4.3 4.7  --  6.5  HGB 8.8* 8.7* 8.8* 9.2*  HCT 27.4* 28.0* 29.3* 29.3*  MCV 94.2 95.6 99.3 96.7  PLT 226 242 231 254   Cardiac Enzymes: No results for input(s): "CKTOTAL", "CKMB", "CKMBINDEX", "TROPONINI" in the last 8760 hours. BNP: Invalid input(s): "POCBNP" Lab Results  Component Value Date   HGBA1C 6.0 (H) 10/13/2022   Lab Results  Component Value Date   TSH 1.499 12/28/2022   Lab Results  Component Value Date   VITAMINB12 362 12/28/2022   Lab Results  Component Value Date   FOLATE 11.7 01/03/2023   Lab Results  Component Value Date   IRON 30 01/03/2023   TIBC 210 (L) 01/03/2023   FERRITIN 76 01/03/2023    Imaging and Procedures obtained prior to SNF admission: EEG adult  Result Date: 12/29/2022 [name], MD     12/29/2022  6:38 PM Patient Name: Arturo Freundlich MRN: [id number] Epilepsy Attending: [name] Referring Physician/Provider: Rolly Salter, MD Date: 12/29/2022 Duration: 25.08 mins Patient  history: 82yo F with ams getting eeg to evaluate for seizure Level of alertness: Awake AEDs during EEG study: None Technical aspects: This EEG study was done with scalp electrodes positioned according to the 10-20 International system of electrode placement. Electrical activity was reviewed with band pass filter of 1-70Hz , sensitivity of 7 uV/mm, display speed of 32mm/sec with a 60Hz  notched filter applied as appropriate. EEG data were recorded continuously and digitally stored.  Video monitoring was available and reviewed as appropriate. Description: The posterior dominant rhythm consists of 8 Hz activity of moderate voltage (25-35 uV) seen predominantly in posterior head regions, symmetric and reactive to eye opening and eye closing. EEG showed intermittent generalized 3 to 6 Hz theta-delta slowing. Hyperventilation and photic stimulation were not performed.   ABNORMALITY - Intermittent slow,  generalized IMPRESSION: This study is suggestive of mild to moderate diffuse encephalopathy. No seizures or epileptiform discharges were seen throughout the recording. [name]    Assessment/Plan  Dementia  Pt requires help with most of her ADLS  She needs help with transferring  Has intermittent baseline confusion as per nursing staff  Pt is alert and oriented x 3   Dysphagia  Will refer to speech therapy    Hyper calcemia Serum calcium 9.1 SPEP m spike , evaluated by hem/ onc and had bone marrow biopsy  Will check bmp, calcium, phosphorus levels    UTI  Denies urinary symptoms   HFpEF  Euvolemic on exam  Missed appt with cardiology , rescheduled   Renal mass Hydronephrosis  Pt needs to follow up with urology for follow up on renal mass Follow up MRI as per urology  Soft tissue attenuation lesion arising from the superior pole of the left kidney measuring 1.9 x 1.6 cm concerning for solid renal mass. Additional complex appearing cysts of the inferior pole of the right kidney and lateral midportion of the left kidney. Recommend multiphasic contrast enhanced CT or MRI for further characterization, preferably on a nonemergent, outpatient basis, when appropriate.  Afib On xarelto  Follow up with cardiology    Anemia Will check cbc     Latest Ref Rng & Units 01/08/2023    4:13 AM 01/07/2023    4:25 AM 01/06/2023    9:26 AM  CBC  WBC 4.0 - 10.5 K/uL 8.8  6.4  6.6   Hemoglobin 12.0 - 15.0 g/dL 9.2  8.8  8.7   Hematocrit 36.0 - 46.0 % 29.3  29.3  28.0   Platelets 150 - 400 K/uL 254  231  242    Iron/TIBC/Ferritin/ %Sat    Component Value Date/Time   IRON 30 01/03/2023 0440   TIBC 210 (L) 01/03/2023 0440   FERRITIN 76 01/03/2023 0440   IRONPCTSAT 14 01/03/2023 0440     M spike  Bone marrow, aspirate, flow cytometry:  -  No immunophenotypic evidence of a lymphoproliferative disorder (i.e.  no monoclonal B cells or immunophenotypically abnormal T  cells detected)  -  CD34 blasts are not increased   Follow up with oncology  HTN  Cont with hydralazine, clonidine prn     Family/ staff Communication:  care plan discussed with the nursing staff   Labs/tests ordered: labs ordered for next week    I spent greater than 60 minutes for the care of this patient in face to face time, chart review, clinical documentation, patient education.

## 2023-01-16 LAB — COMPREHENSIVE METABOLIC PANEL
Albumin: 2.5 — AB (ref 3.5–5.0)
Calcium: 8.3 — AB (ref 8.7–10.7)
Globulin: 2.2
eGFR: 88

## 2023-01-16 LAB — CBC: RBC: 2.78 — AB (ref 3.87–5.11)

## 2023-01-16 LAB — CBC AND DIFFERENTIAL
HCT: 25 — AB (ref 36–46)
Hemoglobin: 8.1 — AB (ref 12.0–16.0)
Neutrophils Absolute: 3641
Platelets: 315 10*3/uL (ref 150–400)
WBC: 5.5

## 2023-01-16 LAB — BASIC METABOLIC PANEL
BUN: 10 (ref 4–21)
Chloride: 105 (ref 99–108)
Creatinine: 0.6 (ref 0.5–1.1)
Glucose: 87
Potassium: 3.4 meq/L — AB (ref 3.5–5.1)
Sodium: 135 — AB (ref 137–147)

## 2023-01-16 LAB — HEPATIC FUNCTION PANEL
ALT: 7 U/L (ref 7–35)
AST: 11 — AB (ref 13–35)
Alkaline Phosphatase: 72 (ref 25–125)
Bilirubin, Total: 0.3

## 2023-01-18 DIAGNOSIS — D472 Monoclonal gammopathy: Secondary | ICD-10-CM | POA: Insufficient documentation

## 2023-01-18 NOTE — Assessment & Plan Note (Addendum)
-  IgM kappa MGUS -pt was hospitalized for metabolic encephalopathy secondary to UTI, AKI, and hypercalcemia on 12/22/2022 -MM lab showed positive M protein 0.3g/dl, and elevated Ig M level at 440mg /dl, slightly elevated kappa light chain with ratio 2.6. -CT CAP with contrast was negative for malignancy, or adenopathy, except a 1.9 cm solid mass in left kidney.  -I reviewed the bone marrow biopsy results, which was negative, no increased plasma cells  in the marrow. -this is consistent with IgM kappa MGUS.  -I reviewed the risk of progression to IgM multiple myeloma or [place] macroglobulinemia, which is moderate (19-37% in next 20 years), and recommend routine lab and office follow-up.

## 2023-01-19 ENCOUNTER — Encounter (HOSPITAL_COMMUNITY): Payer: Self-pay

## 2023-01-19 ENCOUNTER — Inpatient Hospital Stay: Payer: Medicare PPO | Attending: Hematology | Admitting: Hematology

## 2023-01-19 DIAGNOSIS — N2889 Other specified disorders of kidney and ureter: Secondary | ICD-10-CM | POA: Diagnosis not present

## 2023-01-19 DIAGNOSIS — D472 Monoclonal gammopathy: Secondary | ICD-10-CM

## 2023-01-19 LAB — BASIC METABOLIC PANEL
BUN: 8 (ref 4–21)
CO2: 23 — AB (ref 13–22)
Chloride: 105 (ref 99–108)
Creatinine: 0.5 (ref 0.5–1.1)
Glucose: 88
Potassium: 3.4 meq/L — AB (ref 3.5–5.1)
Sodium: 137 (ref 137–147)

## 2023-01-19 LAB — VITAMIN B12: Vitamin B-12: 359

## 2023-01-19 LAB — IRON,TIBC AND FERRITIN PANEL
%SAT: 10
Ferritin: 88
Iron: 16
TIBC: 155

## 2023-01-19 LAB — COMPREHENSIVE METABOLIC PANEL
Albumin: 2.6 — AB (ref 3.5–5.0)
Calcium: 8.4 — AB (ref 8.7–10.7)
Globulin: 2.3
eGFR: 91

## 2023-01-19 LAB — HEPATIC FUNCTION PANEL
ALT: 3 U/L — AB (ref 7–35)
AST: 10 — AB (ref 13–35)
Alkaline Phosphatase: 79 (ref 25–125)
Bilirubin, Total: 0.4

## 2023-01-19 LAB — CBC AND DIFFERENTIAL
HCT: 24 — AB (ref 36–46)
Hemoglobin: 8 — AB (ref 12.0–16.0)
Neutrophils Absolute: 4730
Platelets: 324 10*3/uL (ref 150–400)
WBC: 6.7

## 2023-01-19 LAB — CBC: RBC: 2.78 — AB (ref 3.87–5.11)

## 2023-01-19 NOTE — Progress Notes (Signed)
[facility]   Telephone:([phone] Fax:([phone]) [phone]   Clinic Follow up Note   Patient Care Team: Sigmund Hazel, MD as PCP - General (Family Medicine) Tessa Lerner, DO as PCP - Cardiology (Cardiology) Suzi Roots as Physician Assistant (Dermatology) 01/19/2023  I connected with Tina Frank on 01/19/23 at 10:00 AM EST by telephone and verified that I am speaking with the correct person using two identifiers.   I discussed the limitations, risks, security and privacy concerns of performing an evaluation and management service by telephone and the availability of in person appointments. I also discussed with the patient that there may be a patient responsible charge related to this service. The patient expressed understanding and agreed to proceed.   Patient's location:  SNF Provider's location:  Office    CHIEF COMPLAINT: Follow-up of bone marrow biopsy results   Oncology history MGUS (monoclonal gammopathy of unknown significance) -IgM kappa MGUS -pt was hospitalized for metabolic encephalopathy secondary to UTI, AKI, and hypercalcemia on 12/22/2022 -MM lab showed positive M protein 0.3g/dl, and elevated Ig M level at 440mg /dl, slightly elevated kappa light chain with ratio 2.6. -CT CAP with contrast was negative for malignancy, or adenopathy, except a 1.9 cm solid mass in left kidney.  -I reviewed the bone marrow biopsy results, which was negative, no increased plasma cells  in the marrow. -this is consistent with IgM kappa MGUS.  -I reviewed the risk of progression to IgM multiple myeloma or Waldenstrm macroglobulinemia, which is moderate (19-37% in next 20 years), and recommend routine lab and office follow-up.  Assessment and Plan    Renal Mass Renal mass identified on CT scan from December 28, 2022, potentially cancerous. Missed last year's annual kidney check due to other complications. Previous CT scans were well-controlled. MRI needed  within three months for further evaluation. Patient prefers MRI after Christmas, acceptable with regular kidney function monitoring. - Order MRI of abdomen with and without contrast 2 weeks before next visit - Schedule follow-up appointment in 2 months - Coordinate with patient's son for appointment details.  Monoclonal Gammopathy of Undetermined Significance (MGUS) Diagnosed with IgM type MGUS following bone marrow biopsy due to elevated calcium and monoclonal protein in blood. Biopsy unremarkable for multiple myeloma. MGUS is benign but requires monitoring every 3-6 months for progression risk. - Repeat lab tests every 3-6 months.  General Health Maintenance Resides in a skilled nursing facility, participating in daily physical therapy, using a walker or wheelchair, and improving eating habits. - Continue physical therapy as scheduled. - Monitor nutritional intake and encourage balanced diet.      Plan -Follow-up in 2 months with lab and abdominal MRI with and without contrast to evaluate her renal mass 1 week before her office visit   Discussed the use of AI scribe software for clinical note transcription with the patient, who gave verbal consent to proceed.  History of Present Illness   The patient, an 82 year old individual residing in a rehabilitation facility, was recently hospitalized and underwent a bone marrow biopsy due to concerns of multiple myeloma. The biopsy was prompted by high calcium levels and the presence of an abnormal M protein in the blood. She reports feeling better since her hospital stay and is actively participating in daily physical therapy. She is also making efforts to improve her eating habits. Mobility is primarily with the assistance of a walker or wheelchair.  In addition to the recent hospitalization, the patient has a history of kidney issues, which  have been monitored annually via CT scans. However, she missed her most recent annual check due to  complications with her shoulder.         REVIEW OF SYSTEMS:   Constitutional: Denies fevers, chills or abnormal weight loss, (+) fatigue  Eyes: Denies blurriness of vision Ears, nose, mouth, throat, and face: Denies mucositis or sore throat Respiratory: Denies cough, dyspnea or wheezes Cardiovascular: Denies palpitation, chest discomfort or lower extremity swelling Gastrointestinal:  Denies nausea, heartburn or change in bowel habits Skin: Denies abnormal skin rashes Lymphatics: Denies new lymphadenopathy or easy bruising Neurological:Denies numbness, tingling or new weaknesses Behavioral/Psych: Mood is stable, no new changes  All other systems were reviewed with the patient and are negative.  MEDICAL HISTORY:  Past Medical History:  Diagnosis Date   CKD (chronic kidney disease), stage II    nephrologist-- dr Kathrene Bongo  [facility])   Diabetes mellitus type 2, diet-controlled (HCC)    followed by pcp---   (09-20-2019  per pt does not check blood sugar at home)   Fracture of humeral shaft, right, closed 04/25/2022   GERD (gastroesophageal reflux disease)    History of primary hyperparathyroidism    s/p  parathyroidectomy right inferior on 12-02-2012   Humerus fracture 04/25/2022   Hyperlipidemia    Hypertension    followd by nephrologist----  (09-20-2019  per pt had stress test done some time ago , unsure when/ where, thinks told ok)   OA (osteoarthritis)    Osteoporosis    Prosthetic joint infection (HCC)    followed by dr j. hatcher (ID)   left total shoulder arthroplasty 12/ 2016  ; 12/ 2020  revision with explant joint replacement and hemiarthroplasty;  completed 6 month antibiotic 05/ 2021   Vertigo     SURGICAL HISTORY: Past Surgical History:  Procedure Laterality Date   COLONOSCOPY     DILATATION & CURETTAGE/HYSTEROSCOPY WITH MYOSURE N/A 09/28/2019   Procedure: DILATATION & CURETTAGE/HYSTEROSCOPY WITH MYOSURE;  Surgeon: Olivia Mackie, MD;  Location: [facility]  [place];  Service: Gynecology;  Laterality: N/A;   EYE SURGERY  yrs ago   laser surgery for retinal tear.  (unilateral , pt unsure which side)   ORIF HUMERUS FRACTURE Right 04/27/2022   Procedure: OPEN REDUCTION INTERNAL FIXATION (ORIF) PROXIMAL HUMERUS FRACTURE;  Surgeon: Bjorn Pippin, MD;  Location: [facility];  Service: Orthopedics;  Laterality: Right;   PARATHYROIDECTOMY N/A 12/02/2012   Procedure: PARATHYROIDECTOMY with frozen section ;  Surgeon: Velora Heckler, MD;  Location: [facility];  Service: General;  Laterality: N/A;   REVERSE SHOULDER ARTHROPLASTY Right 04/27/2022   Procedure: REVERSE SHOULDER ARTHROPLASTY;  Surgeon: Bjorn Pippin, MD;  Location: [facility];  Service: Orthopedics;  Laterality: Right;   SHOULDER INJECTION Left 07/27/2013   Procedure: SHOULDER INJECTION;  Surgeon: Loreta Ave, MD;  Location: [facility] OR;  Service: Orthopedics;  Laterality: Left;   STERIOD INJECTION Right 02/14/2015   Procedure: RIGHT SHOULDER CORTISONE INJECTION;  Surgeon: Loreta Ave, MD;  Location: [facility] OR;  Service: Orthopedics;  Laterality: Right;   TOTAL HIP ARTHROPLASTY Left 07/27/2013   Procedure: TOTAL HIP ARTHROPLASTY ANTERIOR APPROACH;  Surgeon: Loreta Ave, MD;  Location: [facility] OR;  Service: Orthopedics;  Laterality: Left;   TOTAL SHOULDER ARTHROPLASTY Left 02/14/2015   Procedure: LEFT TOTAL SHOULDER ARTHROPLASTY;  Surgeon: Loreta Ave, MD;  Location: [facility] OR;  Service: Orthopedics;  Laterality: Left;   TOTAL SHOULDER REVISION Left 02/09/2019   Procedure: TOTAL SHOULDER REVISION;  Surgeon: Everardo Pacific,  Murriel Hopper, MD;  Location: [facility];  Service: Orthopedics;  Laterality: Left;    I have reviewed the social history and family history with the patient and they are unchanged from previous note.  ALLERGIES:  is allergic to abaloparatide, mobic [meloxicam], crestor [rosuvastatin], norvasc [amlodipine besylate], simvastatin, sulfa antibiotics, and zetia [ezetimibe].  MEDICATIONS:  Current Outpatient  Medications  Medication Sig Dispense Refill   allopurinol (ZYLOPRIM) 300 MG tablet Take 300 mg by mouth daily.     aspirin EC 81 MG tablet Take 1 tablet (81 mg total) by mouth daily. Swallow whole. 30 tablet 0   cloNIDine (CATAPRES) 0.1 MG tablet Take 0.1 mg by mouth daily as needed (for hypertension- if systolic b/p is over 160).     feeding supplement (ENSURE ENLIVE / ENSURE PLUS) LIQD Take 237 mLs by mouth 2 (two) times daily between meals.     hydrALAZINE (APRESOLINE) 100 MG tablet Take 1 tablet (100 mg total) by mouth 3 (three) times daily.     magnesium oxide (MAG-OX) 400 (240 Mg) MG tablet Take 1 tablet (400 mg total) by mouth daily.     meclizine (ANTIVERT) 12.5 MG tablet Take 1 tablet (12.5 mg total) by mouth 3 (three) times daily as needed for dizziness. 20 tablet 0   pantoprazole (PROTONIX) 40 MG tablet Take 1 tablet (40 mg total) by mouth 2 (two) times daily before a meal. Take 40 mg by mouth in the morning and at 5 PM- BEFORE FOOD 120 tablet 0   polyethylene glycol (MIRALAX / GLYCOLAX) 17 g packet Take 17 g by mouth 2 (two) times daily.     Rivaroxaban (XARELTO) 15 MG TABS tablet Take 1 tablet (15 mg total) by mouth daily with supper. 30 tablet 0   senna-docusate (SENOKOT-S) 8.6-50 MG tablet Take 1 tablet by mouth 2 (two) times daily.     tamsulosin (FLOMAX) 0.4 MG CAPS capsule Take 1 capsule (0.4 mg total) by mouth daily.     No current facility-administered medications for this visit.    PHYSICAL EXAMINATION: Not performed   LABORATORY DATA:  I have reviewed the data as listed    Latest Ref Rng & Units 01/08/2023    4:13 AM 01/07/2023    4:25 AM 01/06/2023    9:26 AM  CBC  WBC 4.0 - 10.5 K/uL 8.8  6.4  6.6   Hemoglobin 12.0 - 15.0 g/dL 9.2  8.8  8.7   Hematocrit 36.0 - 46.0 % 29.3  29.3  28.0   Platelets 150 - 400 K/uL 254  231  242         Latest Ref Rng & Units 01/08/2023    4:13 AM 01/07/2023    4:25 AM 01/06/2023    9:26 AM  CMP  Glucose 70 - 99 mg/dL 95   89  010   BUN 8 - 23 mg/dL 13  13  14    Creatinine 0.44 - 1.00 mg/dL 2.72  5.36  6.44   Sodium 135 - 145 mmol/L 135  134  134   Potassium 3.5 - 5.1 mmol/L 3.1  3.6  3.6   Chloride 98 - 111 mmol/L 106  106  106   CO2 22 - 32 mmol/L 22  19  22    Calcium 8.9 - 10.3 mg/dL 9.1  9.2  9.9   Total Protein 6.5 - 8.1 g/dL 5.6     Total Bilirubin 0.3 - 1.2 mg/dL 0.6     Alkaline Phos 38 - 126 U/L  64     AST 15 - 41 U/L 17     ALT 0 - 44 U/L 11         RADIOGRAPHIC STUDIES: I have personally reviewed the radiological images as listed and agreed with the findings in the report. No results found.     I discussed the assessment and treatment plan with the patient. The patient was provided an opportunity to ask questions and all were answered. The patient agreed with the plan and demonstrated an understanding of the instructions.   The patient was advised to call back or seek an in-person evaluation if the symptoms worsen or if the condition fails to improve as anticipated.  I provided 15 minutes of non face-to-face telephone visit time during this encounter, and > 50% was spent counseling as documented under my assessment & plan.     Malachy Mood, MD 01/19/23

## 2023-01-20 ENCOUNTER — Non-Acute Institutional Stay (SKILLED_NURSING_FACILITY): Payer: Self-pay | Admitting: Nurse Practitioner

## 2023-01-20 ENCOUNTER — Encounter: Payer: Self-pay | Admitting: Nurse Practitioner

## 2023-01-20 DIAGNOSIS — M109 Gout, unspecified: Secondary | ICD-10-CM | POA: Diagnosis not present

## 2023-01-20 DIAGNOSIS — D509 Iron deficiency anemia, unspecified: Secondary | ICD-10-CM

## 2023-01-20 DIAGNOSIS — K5901 Slow transit constipation: Secondary | ICD-10-CM

## 2023-01-20 DIAGNOSIS — K219 Gastro-esophageal reflux disease without esophagitis: Secondary | ICD-10-CM | POA: Diagnosis not present

## 2023-01-20 DIAGNOSIS — Z66 Do not resuscitate: Secondary | ICD-10-CM

## 2023-01-20 DIAGNOSIS — I10 Essential (primary) hypertension: Secondary | ICD-10-CM

## 2023-01-20 NOTE — Assessment & Plan Note (Addendum)
Hgb 8.0, Iron 16, ferritin 88 01/20/23, 8.1 01/15/23, Iron 25, Folate 5.3, Vit B12 359 12/10/22, added Iron 01/15/23, added Folate 1mg  every day 01/20/23, no s/s active bleeding. 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   01/15/23 wbc 5.5, Hgb 81, plt 315, Na 135, K 3.4, Bun 10, creat 0.6, Ca 8.3, Mg/P wnl, anemia panel and adding Fe 325mg  MWF after obtained FOBT x3  Repeat CBC/diff one wek

## 2023-01-20 NOTE — Assessment & Plan Note (Signed)
takes Pantoprazole, Hgb 11.2 10/15/22>>8.0 01/20/23

## 2023-01-20 NOTE — Assessment & Plan Note (Signed)
stable, takes Allopurinol.

## 2023-01-20 NOTE — Assessment & Plan Note (Signed)
Intermittent elevated Sbp,  on Hydralazine,  prn Clonidine

## 2023-01-20 NOTE — Assessment & Plan Note (Signed)
Stable, stable, on prn Senokot S

## 2023-01-20 NOTE — Progress Notes (Unsigned)
Location:  [facility] Room Number: 061-A Place of Service:  SNF [phone]) Provider:  Alverda Skeans, MD  Patient Care Team: Tina Hazel, MD as PCP - General (Family Medicine) Tina Lerner, DO as PCP - Cardiology (Cardiology) Tina Frank as Physician Assistant (Dermatology)  Extended Emergency Contact Information Primary Emergency Contact: Tina Frank, [place] [address] [place] Home Phone: [phone] Work Phone: [phone] Mobile Phone: [phone] Relation: Son Secondary Emergency Contact: Weisenburger,Tina Frank  [place] Phone: [phone] Relation: Son  Code Status:  DNR Goals of care: Advanced Directive information    01/20/2023    8:48 AM  Advanced Directives  Does Patient Have a Medical Advance Directive? Yes  Type of Advance Directive Out of facility DNR (pink MOST or yellow form)  Does patient want to make changes to medical advance directive? No - Patient declined  Pre-existing out of facility DNR order (yellow form or pink MOST form) Pink MOST/Yellow Form most recent copy in chart - Physician notified to receive inpatient order     Chief Complaint  Patient presents with  . Acute Visit    Low hemoglobin    HPI:  Pt is a 82 y.o. female seen today for an acute visit for Hgb 8.0, Iron 16, ferritin 88 01/20/23, 8.1 01/15/23, Iron 25 12/10/22, added Iron 01/15/23, no s/s active bleeding. 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   Hydronephrosis: f/u urology for renal mass left 1.9x1.6cm, pending MRI  Hospitalized 12/22/22 for metabolic encephalopathy 2/2 to UTI, AKI, and hypercalcemia.   Dysphagia, f/u ST  Hospitalized 10/12/22-10/17/22 UTI, encephalopathy, acute CVA             GERD, takes Pantoprazole, Hgb 11.2 10/15/22>>8.0 01/20/23             OA R+L  knee pain, f/u Ortho for Gel inj             CVA resolved difficulty word finding, on Statin, ASA, f/u stroke clinic, MRI small punctate infarct in the posteerior limb of R internal capsule, no focal weakness             Gout, stable, takes Allopurinol             HLD, off Atorvastatin, LDL 62 10/13/22             HTN, on Hydralazine,  prn Clonidine             CXR 10/14/22 pulm edema/volume overload, not apparent, prn Furosemide, BNP 950.8 10/14/22, Echo 60-65% EF 10/13/22             Constipation, stable, on prn Senokot S             Prediabetes Hgb A1c 6.0 10/13/22, TSH 1.293 10/12/22             Positive anti CCP test, f/u Rheumatology              Bradycardia, Hx of, off Carvedilol                            Past Medical History:  Diagnosis Date  . CKD (chronic kidney disease), stage II    nephrologist-- dr Kathrene Bongo  [facility])  . Diabetes mellitus type 2, diet-controlled (HCC)  followed by pcp---   (09-20-2019  per pt does not check blood sugar at home)  . Fracture of humeral shaft, right, closed 04/25/2022  . GERD (gastroesophageal reflux disease)   . History of primary hyperparathyroidism    s/p  parathyroidectomy right inferior on 12-02-2012  . Humerus fracture 04/25/2022  . Hyperlipidemia   . Hypertension    followd by nephrologist----  (09-20-2019  per pt had stress test done some time ago , unsure when/ where, thinks told ok)  . OA (osteoarthritis)   . Osteoporosis   . Prosthetic joint infection (HCC)    followed by dr j. hatcher (ID)   left total shoulder arthroplasty 12/ 2016  ; 12/ 2020  revision with explant joint replacement and hemiarthroplasty;  completed 6 month antibiotic 05/ 2021  . Vertigo    Past Surgical History:  Procedure Laterality Date  . COLONOSCOPY    . DILATATION & CURETTAGE/HYSTEROSCOPY WITH MYOSURE N/A 09/28/2019   Procedure: DILATATION & CURETTAGE/HYSTEROSCOPY WITH MYOSURE;  Surgeon: Olivia Mackie, MD;  Location: [facility];   Service: Gynecology;  Laterality: N/A;  . EYE SURGERY  yrs ago   laser surgery for retinal tear.  (unilateral , pt unsure which side)  . ORIF HUMERUS FRACTURE Right 04/27/2022   Procedure: OPEN REDUCTION INTERNAL FIXATION (ORIF) PROXIMAL HUMERUS FRACTURE;  Surgeon: Bjorn Pippin, MD;  Location: [facility];  Service: Orthopedics;  Laterality: Right;  . PARATHYROIDECTOMY N/A 12/02/2012   Procedure: PARATHYROIDECTOMY with frozen section ;  Surgeon: Velora Heckler, MD;  Location: [facility];  Service: General;  Laterality: N/A;  . REVERSE SHOULDER ARTHROPLASTY Right 04/27/2022   Procedure: REVERSE SHOULDER ARTHROPLASTY;  Surgeon: Bjorn Pippin, MD;  Location: [facility];  Service: Orthopedics;  Laterality: Right;  . SHOULDER INJECTION Left 07/27/2013   Procedure: SHOULDER INJECTION;  Surgeon: Loreta Ave, MD;  Location: [facility] OR;  Service: Orthopedics;  Laterality: Left;  . STERIOD INJECTION Right 02/14/2015   Procedure: RIGHT SHOULDER CORTISONE INJECTION;  Surgeon: Loreta Ave, MD;  Location: [facility] OR;  Service: Orthopedics;  Laterality: Right;  . TOTAL HIP ARTHROPLASTY Left 07/27/2013   Procedure: TOTAL HIP ARTHROPLASTY ANTERIOR APPROACH;  Surgeon: Loreta Ave, MD;  Location: [facility] OR;  Service: Orthopedics;  Laterality: Left;  . TOTAL SHOULDER ARTHROPLASTY Left 02/14/2015   Procedure: LEFT TOTAL SHOULDER ARTHROPLASTY;  Surgeon: Loreta Ave, MD;  Location: [facility] OR;  Service: Orthopedics;  Laterality: Left;  . TOTAL SHOULDER REVISION Left 02/09/2019   Procedure: TOTAL SHOULDER REVISION;  Surgeon: Bjorn Pippin, MD;  Location: [facility];  Service: Orthopedics;  Laterality: Left;    Allergies  Allergen Reactions  . Abaloparatide Other (See Comments)    "Burred vision, lightheaded" (patient does not recall this in 2024)  . Mobic [Meloxicam] Hypertension  . Crestor [Rosuvastatin] Other (See Comments)    Muscle aches  . Norvasc [Amlodipine Besylate] Swelling and Other (See Comments)    Ankle swelling  .  Simvastatin Other (See Comments)    Muscle aches  Other Reaction(s): high LFTs  . Sulfa Antibiotics Rash  . Zetia [Ezetimibe] Other (See Comments)    Muscle aches    Outpatient Encounter Medications as of 01/20/2023  Medication Sig  . allopurinol (ZYLOPRIM) 300 MG tablet Take 300 mg by mouth daily.  Marland Kitchen aspirin EC 81 MG tablet Take 1 tablet (81 mg total) by mouth daily. Swallow whole.  . cloNIDine (CATAPRES) 0.1 MG tablet Take 0.1 mg by mouth daily as needed (for hypertension-  if systolic b/p is over 160).  . feeding supplement (ENSURE ENLIVE / ENSURE PLUS) LIQD Take 237 mLs by mouth 2 (two) times daily between meals.  . hydrALAZINE (APRESOLINE) 100 MG tablet Take 1 tablet (100 mg total) by mouth 3 (three) times daily.  . magnesium oxide (MAG-OX) 400 (240 Mg) MG tablet Take 1 tablet (400 mg total) by mouth daily.  . meclizine (ANTIVERT) 12.5 MG tablet Take 1 tablet (12.5 mg total) by mouth 3 (three) times daily as needed for dizziness.  . pantoprazole (PROTONIX) 40 MG tablet Take 1 tablet (40 mg total) by mouth 2 (two) times daily before a meal. Take 40 mg by mouth in the morning and at 5 PM- BEFORE FOOD  . Rivaroxaban (XARELTO) 15 MG TABS tablet Take 1 tablet (15 mg total) by mouth daily with supper.  . tamsulosin (FLOMAX) 0.4 MG CAPS capsule Take 1 capsule (0.4 mg total) by mouth daily.  . polyethylene glycol (MIRALAX / GLYCOLAX) 17 g packet Take 17 g by mouth 2 (two) times daily. (Patient not taking: Reported on 01/20/2023)  . senna-docusate (SENOKOT-S) 8.6-50 MG tablet Take 1 tablet by mouth 2 (two) times daily. (Patient not taking: Reported on 01/20/2023)   No facility-administered encounter medications on file as of 01/20/2023.    Review of Systems  Constitutional:  Negative for appetite change, fatigue and fever.  HENT:  Negative for trouble swallowing.   Eyes:  Negative for visual disturbance.  Respiratory:  Negative for cough and shortness of breath.   Cardiovascular:  Negative  for leg swelling.  Gastrointestinal:  Negative for abdominal pain and constipation.       GERD symptoms intermittently  Genitourinary:  Negative for dysuria and urgency.       Nocturnal urination 2x average.   Musculoskeletal:  Positive for arthralgias and gait problem.       R+L knee pain, R+L shoulder limited overhead ROM, c/o pain in the R shoulder, mild.   Skin:  Negative for color change.  Neurological:  Negative for speech difficulty, weakness and headaches.  Psychiatric/Behavioral:  Positive for sleep disturbance. Negative for confusion. The patient is not nervous/anxious.        Sometimes not sleeping, declined sleeping aid.     Immunization History  Administered Date(s) Administered  . Influenza Split 11/24/2008  . Influenza, High Dose Seasonal PF 02/24/2022  . Influenza,inj,Quad PF,6+ Mos 11/20/2010, 02/01/2013  . Influenza-Unspecified 02/19/2018  . Moderna Sars-Covid-2 Vaccination 04/23/2019, 05/21/2019, 02/20/2020  . Pneumococcal Conjugate-13 03/16/2014  . Pneumococcal Polysaccharide-23 04/28/2005  . Td 04/28/2005  . Tdap 06/08/2017  . Zoster Recombinant(Shingrix) 01/14/2019  . Zoster, Live 07/21/2007   Pertinent  Health Maintenance Due  Topic Date Due  . OPHTHALMOLOGY EXAM  Never done  . FOOT EXAM  10/01/2022  . INFLUENZA VACCINE  10/09/2022  . HEMOGLOBIN A1C  04/15/2023  . DEXA SCAN  Completed      04/12/2019    2:21 PM 07/19/2019    2:20 PM 09/28/2019    8:51 AM 06/21/2020    5:59 PM 09/23/2021    1:10 PM  Fall Risk  Falls in the past year? 0 0     (RETIRED) Patient Fall Risk Level Low fall risk Low fall risk Moderate fall risk Low fall risk Low fall risk  Fall risk Follow up Falls evaluation completed Falls evaluation completed      Functional Status Survey:    Vitals:   01/20/23 0843  BP: (!) 145/64  Pulse: 81  Resp: 20  Temp: (!) 97.4 F (36.3 C)  SpO2: 98%  Weight: 163 lb (73.9 kg)  Height: 5\' 5"  (1.651 m)   Body mass index is 27.12  kg/m. Physical Exam Vitals and nursing note reviewed.  Constitutional:      Appearance: Normal appearance.  HENT:     Head: Normocephalic and atraumatic.     Nose: Nose normal.     Mouth/Throat:     Mouth: Mucous membranes are moist.  Eyes:     Extraocular Movements: Extraocular movements intact.     Conjunctiva/sclera: Conjunctivae normal.     Pupils: Pupils are equal, round, and reactive to light.  Cardiovascular:     Rate and Rhythm: Normal rate and regular rhythm.     Heart sounds: No murmur heard. Pulmonary:     Effort: Pulmonary effort is normal.     Breath sounds: Rales present. No wheezing or rhonchi.     Comments: Bibasilar rales.  Abdominal:     Palpations: Abdomen is soft.     Tenderness: There is no abdominal tenderness.  Musculoskeletal:        General: Tenderness present. No swelling or deformity.     Cervical back: Normal range of motion and neck supple.     Right lower leg: No edema.     Left lower leg: Edema present.     Comments: R+L shoulder limited overhead ROM, mild R shoulder pain. S/p R+L shoulder replacement. Left ankle trace edema.   Skin:    General: Skin is warm and dry.     Findings: Rash present.  Neurological:     General: No focal deficit present.     Mental Status: She is alert and oriented to person, place, and time. Mental status is at baseline.     Gait: Gait abnormal.  Psychiatric:        Mood and Affect: Mood normal.        Behavior: Behavior normal.        Thought Content: Thought content normal.        Judgment: Judgment normal.    Labs reviewed: Recent Labs    01/06/23 0926 01/07/23 0425 01/08/23 0413 01/16/23 0000  NA 134* 134* 135 135*  K 3.6 3.6 3.1* 3.4*  CL 106 106 106 105  CO2 22 19* 22  --   GLUCOSE 115* 89 95  --   BUN 14 13 13 10   CREATININE 0.62 0.65 0.61 0.6  CALCIUM 9.9 9.2 9.1 8.3*  MG 1.9 2.2 2.4  --   PHOS 2.0* 2.6 2.2*  --    Recent Labs    10/13/22 0738 12/24/22 0347 12/28/22 1256  01/01/23 0416 01/07/23 0425 01/08/23 0413 01/16/23 0000  AST 14*  --  17  --   --  17 11*  ALT 10  --  14  --   --  11 7  ALKPHOS 56  --  85  --   --  64 72  BILITOT 1.2  --  0.8  --   --  0.6  --   PROT 5.1*  --  6.5  --   --  5.6*  --   ALBUMIN 2.6*   < > 3.0*   < > 2.5* 2.7* 2.5*   < > = values in this interval not displayed.   Recent Labs    01/06/23 0926 01/07/23 0425 01/08/23 0413 01/16/23 0000  WBC 6.6 6.4 8.8 5.5  NEUTROABS 4.7  --  6.5 3,641.00  HGB 8.7* 8.8*  9.2* 8.1*  HCT 28.0* 29.3* 29.3* 25*  MCV 95.6 99.3 96.7  --   PLT 242 231 254 315   Lab Results  Component Value Date   TSH 1.499 12/28/2022   Lab Results  Component Value Date   HGBA1C 6.0 (H) 10/13/2022   Lab Results  Component Value Date   CHOL 121 10/13/2022   HDL 44 10/13/2022   LDLCALC 62 10/13/2022   TRIG 74 10/13/2022   CHOLHDL 2.8 10/13/2022    Significant Diagnostic Results in last 30 days:  CT BONE MARROW BIOPSY & ASPIRATION  Result Date: 01/06/2023 INDICATION: 82 year old female with history of monoclonal gammopathy of unknown significance. EXAM: CT-GUIDED BONE MARROW BIOPSY AND ASPIRATION MEDICATIONS: None ANESTHESIA/SEDATION: Fentanyl 50 mcg IV; Versed 1 mg IV Sedation Time: 10 minutes; The patient was continuously monitored during the procedure by the interventional radiology nurse under my direct supervision. COMPLICATIONS: None immediate. PROCEDURE: Informed consent was obtained from the patient following an explanation of the procedure, risks, benefits and alternatives. The patient understands, agrees and consents for the procedure. All questions were addressed. A time out was performed prior to the initiation of the procedure. The patient was positioned prone and non-contrast localization CT was performed of the pelvis to demonstrate the iliac marrow spaces. The operative site was prepped and draped in the usual sterile fashion. Under sterile conditions and local anesthesia, a 22 gauge  spinal needle was utilized for procedural planning. Next, an 11 gauge coaxial bone biopsy needle was advanced into the right iliac marrow space. Needle position was confirmed with CT imaging. Initially, a bone marrow aspiration was performed. Next, a bone marrow biopsy was obtained with the 11 gauge outer bone marrow device. Samples were prepared with the cytotechnologist and deemed adequate. The needle was removed and superficial hemostasis was obtained with manual compression. A dressing was applied. The patient tolerated the procedure well without immediate post procedural complication. IMPRESSION: Successful CT guided right iliac bone marrow aspiration and core biopsy. Marliss Coots, MD [facility] Radiology Electronically Signed   By: Marliss Coots M.D.   On: 01/06/2023 09:22   CT CHEST ABDOMEN PELVIS W CONTRAST  Result Date: 01/03/2023 CLINICAL DATA:  Hypercalcemia, weight loss, rule out malignancy * Tracking Code: BO * EXAM: CT CHEST, ABDOMEN, AND PELVIS WITH CONTRAST TECHNIQUE: Multidetector CT imaging of the chest, abdomen and pelvis was performed following the standard protocol during bolus administration of intravenous contrast. RADIATION DOSE REDUCTION: This exam was performed according to the departmental dose-optimization program which includes automated exposure control, adjustment of the mA and/or kV according to patient size and/or use of iterative reconstruction technique. CONTRAST:  OMNIPAQUE IOHEXOL 300 MG/ML  SOLN COMPARISON:  CT abdomen pelvis, 06/21/2019 FINDINGS: CT CHEST FINDINGS Cardiovascular: Aortic atherosclerosis. Cardiomegaly. Three-vessel coronary artery calcifications. No pericardial effusion. Mediastinum/Nodes: No enlarged mediastinal, hilar, or axillary lymph nodes. Thyroid gland, trachea, and esophagus demonstrate no significant findings. Lungs/Pleura: Bibasilar scarring or atelectasis. No pleural effusion or pneumothorax.  Musculoskeletal: No chest wall abnormality. No acute osseous findings. Status post bilateral shoulder arthroplasty. CT ABDOMEN PELVIS FINDINGS Hepatobiliary: No solid liver abnormality is seen. Layering sludge in the gallbladder. No discrete gallstones. No gallbladder wall thickening, or biliary dilatation. Pancreas: Unremarkable. No pancreatic ductal dilatation or surrounding inflammatory changes. Spleen: Normal in size without significant abnormality. Adrenals/Urinary Tract: Benign non nodular adenomatous thickening of the adrenal glands. Mild bilateral hydronephrosis and hydroureter to the ureterovesicular junctions, without calculus or other obstruction visualized. Distended urinary bladder,  measuring up to 15.1 cm. Numerous bilateral renal lesions. The majority of these are simple appearing, fluid attenuation cysts, however there is a soft tissue attenuation lesion arising from the superior pole of the left kidney measuring 1.9 x 1.6 cm (series 3, image 69). Additional complex appearing cyst of the inferior pole of the right kidney with both fluid and soft tissue attenuation components, measuring 3.8 x 3.3 cm (series 3, image 73) and of the lateral midportion of the left kidney measuring 1.9 x 1.3 cm (series 3, image 67). Stomach/Bowel: Stomach is within normal limits. Appendix appears normal. No evidence of bowel wall thickening, distention, or inflammatory changes. Descending and sigmoid diverticulosis. Vascular/Lymphatic: Severe aortic atherosclerosis. No enlarged abdominal or pelvic lymph nodes. Reproductive: No mass or other abnormality. Other: No abdominal wall hernia or abnormality. No ascites. Musculoskeletal: No acute osseous findings. Status post left hip total arthroplasty. IMPRESSION: 1. No definite evidence of malignancy or metastatic disease in the chest, abdomen, or pelvis. 2. Soft tissue attenuation lesion arising from the superior pole of the left kidney measuring 1.9 x 1.6 cm concerning for  solid renal mass. Additional complex appearing cysts of the inferior pole of the right kidney and lateral midportion of the left kidney. Recommend multiphasic contrast enhanced CT or MRI for further characterization, preferably on a nonemergent, outpatient basis, when appropriate. 3. Mild bilateral hydronephrosis and hydroureter to the ureterovesicular junctions, without calculus or other obstruction visualized. Distended urinary bladder, measuring up to 15.1 cm. Findings are consistent with urinary retention and hydronephrosis secondary to back pressure. 4. Cardiomegaly and coronary artery disease. Aortic Atherosclerosis (ICD10-I70.0). Electronically Signed   By: Jearld Lesch M.D.   On: 01/03/2023 15:07   EEG adult  Result Date: 12/29/2022 [name], MD     12/29/2022  6:38 PM Patient Name: Lynix Glendening MRN: [id number] Epilepsy Attending: [name] Referring Physician/Provider: Rolly Salter, MD Date: 12/29/2022 Duration: 25.08 mins Patient history: 82yo F with ams getting eeg to evaluate for seizure Level of alertness: Awake AEDs during EEG study: None Technical aspects: This EEG study was done with scalp electrodes positioned according to the 10-20 International system of electrode placement. Electrical activity was reviewed with band pass filter of 1-70Hz , sensitivity of 7 uV/mm, display speed of 64mm/sec with a 60Hz  notched filter applied as appropriate. EEG data were recorded continuously and digitally stored.  Video monitoring was available and reviewed as appropriate. Description: The posterior dominant rhythm consists of 8 Hz activity of moderate voltage (25-35 uV) seen predominantly in posterior head regions, symmetric and reactive to eye opening and eye closing. EEG showed intermittent generalized 3 to 6 Hz theta-delta slowing. Hyperventilation and photic stimulation were not performed.   ABNORMALITY - Intermittent slow, generalized IMPRESSION: This study is suggestive of mild to  moderate diffuse encephalopathy. No seizures or epileptiform discharges were seen throughout the recording. [name]   MR BRAIN WO CONTRAST  Result Date: 12/29/2022 CLINICAL DATA:  Altered mental status EXAM: MRI HEAD WITHOUT CONTRAST TECHNIQUE: Multiplanar, multiecho pulse sequences of the brain and surrounding structures were obtained without intravenous contrast. COMPARISON:  10/12/2022 FINDINGS: Brain: No restricted diffusion to suggest acute or subacute infarct. No acute hemorrhage, mass, mass effect, or midline shift. No hydrocephalus or extra-axial collection. Pituitary and craniocervical junction within normal limits. Redemonstrated hemosiderin staining in the right occipital lobe. Confluent T2 hyperintense signal in the periventricular white matter and pons, likely the sequela of moderate chronic small vessel ischemic disease. Vascular: Normal arterial flow  voids. Skull and upper cervical spine: Normal marrow signal. Sinuses/Orbits: Clear paranasal sinuses. No acute finding in the orbits. Status post bilateral lens replacements. Other: The mastoid air cells are well aerated. IMPRESSION: No acute intracranial process. No evidence of acute or subacute infarct. Electronically Signed   By: Wiliam Ke M.D.   On: 12/29/2022 01:42   ECHOCARDIOGRAM LIMITED  Result Date: 12/24/2022    ECHOCARDIOGRAM LIMITED REPORT   Patient Name:   AKAIYA HOLLADAY Date of Exam: 12/24/2022 Medical Rec #:  [id number]       Height:       65.0 in Accession #:    [id number]      Weight:       125.7 lb Date of Birth:  09-Sep-1940       BSA:          1.624 m Patient Age:    82 years        BP:           160/63 mmHg Patient Gender: F               HR:           68 bpm. Exam Location:  Inpatient Procedure: Limited Echo, Limited Color Doppler and Cardiac Doppler Indications:    Atrial fibrillation  History:        Patient has prior history of Echocardiogram examinations, most                 recent 10/13/2022. Risk  Factors:Hypertension, Dyslipidemia and                 Diabetes. CKD.  Sonographer:    Milda Smart Referring Phys: [id number] PRANAV M PATEL  Sonographer Comments: Suboptimal subcostal window. Image acquisition challenging due to respiratory motion. IMPRESSIONS  1. Left ventricular ejection fraction, by estimation, is 60 to 65%. The left ventricle has normal function.  2. Right ventricular systolic function is normal. The right ventricular size is normal. Tricuspid regurgitation signal is inadequate for assessing PA pressure.  3. The mitral valve is grossly normal. Trivial mitral valve regurgitation. No evidence of mitral stenosis.  4. The aortic valve is tricuspid. There is mild calcification of the aortic valve. Aortic valve regurgitation is trivial. Aortic valve sclerosis is present, with no evidence of aortic valve stenosis.  5. The inferior vena cava is normal in size with greater than 50% respiratory variability, suggesting right atrial pressure of 3 mmHg. Comparison(s): No significant change from prior study. FINDINGS  Left Ventricle: Left ventricular ejection fraction, by estimation, is 60 to 65%. The left ventricle has normal function. The left ventricular internal cavity size was normal in size. There is no left ventricular hypertrophy. Right Ventricle: The right ventricular size is normal. No increase in right ventricular wall thickness. Right ventricular systolic function is normal. Tricuspid regurgitation signal is inadequate for assessing PA pressure. Pericardium: There is no evidence of pericardial effusion. Presence of epicardial fat layer. Mitral Valve: The mitral valve is grossly normal. Trivial mitral valve regurgitation. No evidence of mitral valve stenosis. Tricuspid Valve: The tricuspid valve is grossly normal. Tricuspid valve regurgitation is trivial. No evidence of tricuspid stenosis. Aortic Valve: The aortic valve is tricuspid. There is mild calcification of the aortic valve. Aortic valve  regurgitation is trivial. Aortic valve sclerosis is present, with no evidence of aortic valve stenosis. Pulmonic Valve: The pulmonic valve was grossly normal. Pulmonic valve regurgitation is trivial. No evidence of pulmonic stenosis. Venous: The inferior vena cava  is normal in size with greater than 50% respiratory variability, suggesting right atrial pressure of 3 mmHg. Additional Comments: Spectral Doppler performed. Color Doppler performed.  LEFT VENTRICLE PLAX 2D LVIDd:         3.90 cm LVIDs:         2.50 cm LV PW:         1.00 cm LV IVS:        0.90 cm  LEFT ATRIUM         Index LA diam:    3.40 cm 2.09 cm/m  AORTIC VALVE LVOT Vmax:   129.00 cm/s LVOT Vmean:  93.400 cm/s LVOT VTI:    0.275 m  SHUNTS Systemic VTI: 0.28 m Lennie Odor MD Electronically signed by Lennie Odor MD Signature Date/Time: 12/24/2022/5:06:50 PM    Final     Assessment/Plan IDA (iron deficiency anemia) Hgb 8.0, Iron 16, ferritin 88 01/20/23, 8.1 01/15/23, Iron 25, Folate 5.3, Vit B12 359 12/10/22, added Iron 01/15/23, added Folate 1mg  every day 01/20/23, no s/s active bleeding. 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   01/15/23 wbc 5.5, Hgb 81, plt 315, Na 135, K 3.4, Bun 10, creat 0.6, Ca 8.3, Mg/P wnl, anemia panel and adding Fe 325mg  MWF after obtained FOBT x3  Repeat CBC/diff one wek  Gastroesophageal reflux disease  takes Pantoprazole, Hgb 11.2 10/15/22>>8.0 01/20/23  Gout stable, takes Allopurinol  Essential hypertension Intermittent elevated Sbp,  on Hydralazine,  prn Clonidine  Slow transit constipation Stable, stable, on prn Senokot S      Family/ staff Communication: plan of care reviewed with the patient and charge nurse.   Labs/tests ordered:  pending CBC/diff  Time spend 30 minutes.

## 2023-01-23 ENCOUNTER — Non-Acute Institutional Stay (SKILLED_NURSING_FACILITY): Payer: Medicare PPO | Admitting: Sports Medicine

## 2023-01-23 ENCOUNTER — Encounter: Payer: Self-pay | Admitting: Sports Medicine

## 2023-01-23 DIAGNOSIS — J029 Acute pharyngitis, unspecified: Secondary | ICD-10-CM | POA: Diagnosis not present

## 2023-01-23 NOTE — Progress Notes (Unsigned)
Location:  [facility] Room Number: N061-A Place of Service:  SNF [phone]) Provider:  Venita Sheffield, MD    Patient Care Team: Sigmund Hazel, MD as PCP - General (Family Medicine) Tessa Lerner, DO as PCP - Cardiology (Cardiology) Suzi Roots as Physician Assistant (Dermatology)  Extended Emergency Contact Information Primary Emergency Contact: [name], [place] [address] [address] Home Phone: [phone] Work Phone: [phone] Mobile Phone: [phone] Relation: Son Secondary Emergency Contact: Rocca,[name]  [place] Phone: [phone] Relation: Son  Code Status:  DNR Goals of care: Advanced Directive information    01/23/2023   12:22 PM  Advanced Directives  Does Patient Have a Medical Advance Directive? Yes  Type of Advance Directive Out of facility DNR (pink MOST or yellow form);Healthcare Power of [place];Living will  Does patient want to make changes to medical advance directive? No - Patient declined  Copy of Healthcare Power of Attorney in Chart? No - copy requested  Would patient like information on creating a medical advance directive? No - Patient declined  Pre-existing out of facility DNR order (yellow form or pink MOST form) Yellow form placed in chart (order not valid for inpatient use)     Chief Complaint  Patient presents with   Acute Visit    Sore throat and Covid positive     HPI:  Pt is a 82 y.o. female seen today for an acute visit for sore throat  Pt seen and examined in her room She is sitting comfortable in her chair Does not appear to be in distress Able to speak in full sentences Pt denies fevers, chills, cough, runny nose, SOB, abdominal pain, nausea, vomiting Reports good appetite  Tolerating PO intake  Covid test was done this morning and came negative    Past Medical History:  Diagnosis Date   CKD (chronic kidney disease), stage II     nephrologist-- dr Kathrene Bongo  [facility])   Diabetes mellitus type 2, diet-controlled (HCC)    followed by pcp---   (09-20-2019  per pt does not check blood sugar at home)   Fracture of humeral shaft, right, closed 04/25/2022   GERD (gastroesophageal reflux disease)    History of primary hyperparathyroidism    s/p  parathyroidectomy right inferior on 12-02-2012   Humerus fracture 04/25/2022   Hyperlipidemia    Hypertension    followd by nephrologist----  (09-20-2019  per pt had stress test done some time ago , unsure when/ where, thinks told ok)   OA (osteoarthritis)    Osteoporosis    Prosthetic joint infection (HCC)    followed by dr j. hatcher (ID)   left total shoulder arthroplasty 12/ 2016  ; 12/ 2020  revision with explant joint replacement and hemiarthroplasty;  completed 6 month antibiotic 05/ 2021   Vertigo    Past Surgical History:  Procedure Laterality Date   COLONOSCOPY     DILATATION & CURETTAGE/HYSTEROSCOPY WITH MYOSURE N/A 09/28/2019   Procedure: DILATATION & CURETTAGE/HYSTEROSCOPY WITH MYOSURE;  Surgeon: Olivia Mackie, MD;  Location: [facility] [place];  Service: Gynecology;  Laterality: N/A;   EYE SURGERY  yrs ago   laser surgery for retinal tear.  (unilateral , pt unsure which side)   ORIF HUMERUS FRACTURE Right 04/27/2022   Procedure: OPEN REDUCTION INTERNAL FIXATION (ORIF) PROXIMAL HUMERUS FRACTURE;  Surgeon: Bjorn Pippin, MD;  Location: [facility];  Service: Orthopedics;  Laterality: Right;  PARATHYROIDECTOMY N/A 12/02/2012   Procedure: PARATHYROIDECTOMY with frozen section ;  Surgeon: Velora Heckler, MD;  Location: [facility];  Service: General;  Laterality: N/A;   REVERSE SHOULDER ARTHROPLASTY Right 04/27/2022   Procedure: REVERSE SHOULDER ARTHROPLASTY;  Surgeon: Bjorn Pippin, MD;  Location: [facility];  Service: Orthopedics;  Laterality: Right;   SHOULDER INJECTION Left 07/27/2013   Procedure: SHOULDER INJECTION;  Surgeon: Loreta Ave, MD;  Location:  [facility] OR;  Service: Orthopedics;  Laterality: Left;   STERIOD INJECTION Right 02/14/2015   Procedure: RIGHT SHOULDER CORTISONE INJECTION;  Surgeon: Loreta Ave, MD;  Location: [facility] OR;  Service: Orthopedics;  Laterality: Right;   TOTAL HIP ARTHROPLASTY Left 07/27/2013   Procedure: TOTAL HIP ARTHROPLASTY ANTERIOR APPROACH;  Surgeon: Loreta Ave, MD;  Location: [facility] OR;  Service: Orthopedics;  Laterality: Left;   TOTAL SHOULDER ARTHROPLASTY Left 02/14/2015   Procedure: LEFT TOTAL SHOULDER ARTHROPLASTY;  Surgeon: Loreta Ave, MD;  Location: [facility] OR;  Service: Orthopedics;  Laterality: Left;   TOTAL SHOULDER REVISION Left 02/09/2019   Procedure: TOTAL SHOULDER REVISION;  Surgeon: Bjorn Pippin, MD;  Location: [facility];  Service: Orthopedics;  Laterality: Left;    Allergies  Allergen Reactions   Abaloparatide Other (See Comments)    "Burred vision, lightheaded" (patient does not recall this in 2024)   Mobic [Meloxicam] Hypertension   Crestor [Rosuvastatin] Other (See Comments)    Muscle aches   Norvasc [Amlodipine Besylate] Swelling and Other (See Comments)    Ankle swelling   Simvastatin Other (See Comments)    Muscle aches  Other Reaction(s): high LFTs   Sulfa Antibiotics Rash   Zetia [Ezetimibe] Other (See Comments)    Muscle aches    Outpatient Encounter Medications as of 01/23/2023  Medication Sig   acetaminophen (TYLENOL) 325 MG tablet Take 650 mg by mouth every 4 (four) hours as needed for moderate pain (pain score 4-6).   allopurinol (ZYLOPRIM) 300 MG tablet Take 300 mg by mouth daily.   aspirin EC 81 MG tablet Take 1 tablet (81 mg total) by mouth daily. Swallow whole.   carbamide peroxide (DEBROX) 6.5 % OTIC solution Place 5 drops into both ears daily.   cloNIDine (CATAPRES) 0.1 MG tablet Take 0.1 mg by mouth daily as needed (for hypertension- if systolic b/p is over 160).   feeding supplement (ENSURE ENLIVE / ENSURE PLUS) LIQD Take 237 mLs by mouth 2 (two) times daily between  meals.   ferrous sulfate 325 (65 FE) MG tablet Take 325 mg by mouth.   folic acid (FOLVITE) 1 MG tablet Take 1 mg by mouth daily.   hydrALAZINE (APRESOLINE) 100 MG tablet Take 1 tablet (100 mg total) by mouth 3 (three) times daily.   magnesium oxide (MAG-OX) 400 (240 Mg) MG tablet Take 1 tablet (400 mg total) by mouth daily.   meclizine (ANTIVERT) 12.5 MG tablet Take 1 tablet (12.5 mg total) by mouth 3 (three) times daily as needed for dizziness.   pantoprazole (PROTONIX) 40 MG tablet Take 1 tablet (40 mg total) by mouth 2 (two) times daily before a meal. Take 40 mg by mouth in the morning and at 5 PM- BEFORE FOOD   Rivaroxaban (XARELTO) 15 MG TABS tablet Take 1 tablet (15 mg total) by mouth daily with supper.   tamsulosin (FLOMAX) 0.4 MG CAPS capsule Take 1 capsule (0.4 mg total) by mouth daily.   Zinc Oxide 10 % OINT Apply 1 Application topically daily.   polyethylene glycol (MIRALAX /  GLYCOLAX) 17 g packet Take 17 g by mouth 2 (two) times daily. (Patient not taking: Reported on 01/20/2023)   senna-docusate (SENOKOT-S) 8.6-50 MG tablet Take 1 tablet by mouth 2 (two) times daily. (Patient not taking: Reported on 01/20/2023)   No facility-administered encounter medications on file as of 01/23/2023.    Review of Systems  Constitutional:  Negative for chills and fever.  HENT:  Positive for sore throat. Negative for sinus pressure.   Respiratory:  Negative for cough, shortness of breath and wheezing.   Cardiovascular:  Negative for chest pain, palpitations and leg swelling.  Gastrointestinal:  Negative for abdominal distention, abdominal pain, blood in stool, constipation, diarrhea, nausea and vomiting.  Genitourinary:  Negative for dysuria, frequency and urgency.  Neurological:  Negative for dizziness, weakness and numbness.  Psychiatric/Behavioral:  Negative for confusion.     Immunization History  Administered Date(s) Administered   Influenza Split 11/24/2008   Influenza, High Dose  Seasonal PF 03/16/2014, 01/14/2016, 01/21/2017, 02/24/2022   Influenza,inj,Quad PF,6+ Mos 11/20/2010, 02/01/2013   Influenza-Unspecified 11/20/2010, 02/01/2013, 02/19/2018, 02/24/2022   Moderna Sars-Covid-2 Vaccination 04/23/2019, 05/21/2019, 02/20/2020   Pneumococcal Conjugate-13 03/16/2014   Pneumococcal Polysaccharide-23 04/28/2005   Td 04/28/2005   Td (Adult) 04/28/2005   Tdap 06/08/2017   Zoster Recombinant(Shingrix) 01/14/2019   Zoster, Live 07/21/2007   Pertinent  Health Maintenance Due  Topic Date Due   OPHTHALMOLOGY EXAM  Never done   FOOT EXAM  10/01/2022   INFLUENZA VACCINE  10/09/2022   HEMOGLOBIN A1C  04/15/2023   DEXA SCAN  Completed      04/12/2019    2:21 PM 07/19/2019    2:20 PM 09/28/2019    8:51 AM 06/21/2020    5:59 PM 09/23/2021    1:10 PM  Fall Risk  Falls in the past year? 0 0     (RETIRED) Patient Fall Risk Level Low fall risk Low fall risk Moderate fall risk Low fall risk Low fall risk  Fall risk Follow up Falls evaluation completed Falls evaluation completed      Functional Status Survey:    Vitals:   01/23/23 1204  BP: 116/62  Pulse: (!) 104  Resp: 20  Temp: (!) 97.4 F (36.3 C)  SpO2: 98%  Weight: 163 lb 4.8 oz (74.1 kg)  Height: 5\' 5"  (1.651 m)   Body mass index is 27.17 kg/m. Physical Exam Constitutional:      Appearance: Normal appearance.  Cardiovascular:     Rate and Rhythm: Normal rate and regular rhythm.     Heart sounds: No murmur heard. Pulmonary:     Effort: Pulmonary effort is normal. No respiratory distress.     Breath sounds: Normal breath sounds. No wheezing.  Abdominal:     General: Bowel sounds are normal. There is no distension.     Tenderness: There is no abdominal tenderness. There is no guarding or rebound.  Musculoskeletal:        General: No swelling or tenderness.  Skin:    General: Skin is dry.  Neurological:     Mental Status: She is alert. Mental status is at baseline.     Sensory: No sensory deficit.      Motor: No weakness.     Labs reviewed: Recent Labs    01/06/23 0926 01/07/23 0425 01/08/23 0413 01/16/23 0000 01/19/23 0000  NA 134* 134* 135 135* 137  K 3.6 3.6 3.1* 3.4* 3.4*  CL 106 106 106 105 105  CO2 22 19* 22  --  23*  GLUCOSE 115* 89 95  --   --   BUN 14 13 13 10 8   CREATININE 0.62 0.65 0.61 0.6 0.5  CALCIUM 9.9 9.2 9.1 8.3* 8.4*  MG 1.9 2.2 2.4  --   --   PHOS 2.0* 2.6 2.2*  --   --    Recent Labs    10/13/22 0738 12/24/22 0347 12/28/22 1256 01/01/23 0416 01/08/23 0413 01/16/23 0000 01/19/23 0000  AST 14*  --  17  --  17 11* 10*  ALT 10  --  14  --  11 7 3*  ALKPHOS 56  --  85  --  64 72 79  BILITOT 1.2  --  0.8  --  0.6  --   --   PROT 5.1*  --  6.5  --  5.6*  --   --   ALBUMIN 2.6*   < > 3.0*   < > 2.7* 2.5* 2.6*   < > = values in this interval not displayed.   Recent Labs    01/06/23 0926 01/07/23 0425 01/08/23 0413 01/16/23 0000 01/19/23 0000  WBC 6.6 6.4 8.8 5.5 6.7  NEUTROABS 4.7  --  6.5 3,641.00 4,730.00  HGB 8.7* 8.8* 9.2* 8.1* 8.0*  HCT 28.0* 29.3* 29.3* 25* 24*  MCV 95.6 99.3 96.7  --   --   PLT 242 231 254 315 324   Lab Results  Component Value Date   TSH 1.499 12/28/2022   Lab Results  Component Value Date   HGBA1C 6.0 (H) 10/13/2022   Lab Results  Component Value Date   CHOL 121 10/13/2022   HDL 44 10/13/2022   LDLCALC 62 10/13/2022   TRIG 74 10/13/2022   CHOLHDL 2.8 10/13/2022    Significant Diagnostic Results in last 30 days:  CT BONE MARROW BIOPSY & ASPIRATION  Result Date: 01/06/2023 INDICATION: 82 year old female with history of monoclonal gammopathy of unknown significance. EXAM: CT-GUIDED BONE MARROW BIOPSY AND ASPIRATION MEDICATIONS: None ANESTHESIA/SEDATION: Fentanyl 50 mcg IV; Versed 1 mg IV Sedation Time: 10 minutes; The patient was continuously monitored during the procedure by the interventional radiology nurse under my direct supervision. COMPLICATIONS: None immediate. PROCEDURE: Informed consent was  obtained from the patient following an explanation of the procedure, risks, benefits and alternatives. The patient understands, agrees and consents for the procedure. All questions were addressed. A time out was performed prior to the initiation of the procedure. The patient was positioned prone and non-contrast localization CT was performed of the pelvis to demonstrate the iliac marrow spaces. The operative site was prepped and draped in the usual sterile fashion. Under sterile conditions and local anesthesia, a 22 gauge spinal needle was utilized for procedural planning. Next, an 11 gauge coaxial bone biopsy needle was advanced into the right iliac marrow space. Needle position was confirmed with CT imaging. Initially, a bone marrow aspiration was performed. Next, a bone marrow biopsy was obtained with the 11 gauge outer bone marrow device. Samples were prepared with the cytotechnologist and deemed adequate. The needle was removed and superficial hemostasis was obtained with manual compression. A dressing was applied. The patient tolerated the procedure well without immediate post procedural complication. IMPRESSION: Successful CT guided right iliac bone marrow aspiration and core biopsy. Marliss Coots, MD [facility] Radiology Electronically Signed   By: Marliss Coots M.D.   On: 01/06/2023 09:22   CT CHEST ABDOMEN PELVIS W CONTRAST  Result Date: 01/03/2023 CLINICAL DATA:  Hypercalcemia, weight loss, rule  out malignancy * Tracking Code: BO * EXAM: CT CHEST, ABDOMEN, AND PELVIS WITH CONTRAST TECHNIQUE: Multidetector CT imaging of the chest, abdomen and pelvis was performed following the standard protocol during bolus administration of intravenous contrast. RADIATION DOSE REDUCTION: This exam was performed according to the departmental dose-optimization program which includes automated exposure control, adjustment of the mA and/or kV according to patient size  and/or use of iterative reconstruction technique. CONTRAST:  OMNIPAQUE IOHEXOL 300 MG/ML  SOLN COMPARISON:  CT abdomen pelvis, 06/21/2019 FINDINGS: CT CHEST FINDINGS Cardiovascular: Aortic atherosclerosis. Cardiomegaly. Three-vessel coronary artery calcifications. No pericardial effusion. Mediastinum/Nodes: No enlarged mediastinal, hilar, or axillary lymph nodes. Thyroid gland, trachea, and esophagus demonstrate no significant findings. Lungs/Pleura: Bibasilar scarring or atelectasis. No pleural effusion or pneumothorax. Musculoskeletal: No chest wall abnormality. No acute osseous findings. Status post bilateral shoulder arthroplasty. CT ABDOMEN PELVIS FINDINGS Hepatobiliary: No solid liver abnormality is seen. Layering sludge in the gallbladder. No discrete gallstones. No gallbladder wall thickening, or biliary dilatation. Pancreas: Unremarkable. No pancreatic ductal dilatation or surrounding inflammatory changes. Spleen: Normal in size without significant abnormality. Adrenals/Urinary Tract: Benign non nodular adenomatous thickening of the adrenal glands. Mild bilateral hydronephrosis and hydroureter to the ureterovesicular junctions, without calculus or other obstruction visualized. Distended urinary bladder, measuring up to 15.1 cm. Numerous bilateral renal lesions. The majority of these are simple appearing, fluid attenuation cysts, however there is a soft tissue attenuation lesion arising from the superior pole of the left kidney measuring 1.9 x 1.6 cm (series 3, image 69). Additional complex appearing cyst of the inferior pole of the right kidney with both fluid and soft tissue attenuation components, measuring 3.8 x 3.3 cm (series 3, image 73) and of the lateral midportion of the left kidney measuring 1.9 x 1.3 cm (series 3, image 67). Stomach/Bowel: Stomach is within normal limits. Appendix appears normal. No evidence of bowel wall thickening, distention, or inflammatory changes. Descending and  sigmoid diverticulosis. Vascular/Lymphatic: Severe aortic atherosclerosis. No enlarged abdominal or pelvic lymph nodes. Reproductive: No mass or other abnormality. Other: No abdominal wall hernia or abnormality. No ascites. Musculoskeletal: No acute osseous findings. Status post left hip total arthroplasty. IMPRESSION: 1. No definite evidence of malignancy or metastatic disease in the chest, abdomen, or pelvis. 2. Soft tissue attenuation lesion arising from the superior pole of the left kidney measuring 1.9 x 1.6 cm concerning for solid renal mass. Additional complex appearing cysts of the inferior pole of the right kidney and lateral midportion of the left kidney. Recommend multiphasic contrast enhanced CT or MRI for further characterization, preferably on a nonemergent, outpatient basis, when appropriate. 3. Mild bilateral hydronephrosis and hydroureter to the ureterovesicular junctions, without calculus or other obstruction visualized. Distended urinary bladder, measuring up to 15.1 cm. Findings are consistent with urinary retention and hydronephrosis secondary to back pressure. 4. Cardiomegaly and coronary artery disease. Aortic Atherosclerosis (ICD10-I70.0). Electronically Signed   By: Jearld Lesch M.D.   On: 01/03/2023 15:07   EEG adult  Result Date: 12/29/2022 [name], MD     12/29/2022  6:38 PM Patient Name: Jameelah Judson MRN: [id number] Epilepsy Attending: [name] Referring Physician/Provider: Rolly Salter, MD Date: 12/29/2022 Duration: 25.08 mins Patient history: 82yo F with ams getting eeg to evaluate for seizure Level of alertness: Awake AEDs during EEG study: None Technical aspects: This EEG study was done with scalp electrodes positioned according to the 10-20 International system of electrode placement. Electrical activity was reviewed with band pass filter of 1-70Hz , sensitivity  of 7 uV/mm, display speed of 67mm/sec with a 60Hz  notched filter applied as appropriate. EEG  data were recorded continuously and digitally stored.  Video monitoring was available and reviewed as appropriate. Description: The posterior dominant rhythm consists of 8 Hz activity of moderate voltage (25-35 uV) seen predominantly in posterior head regions, symmetric and reactive to eye opening and eye closing. EEG showed intermittent generalized 3 to 6 Hz theta-delta slowing. Hyperventilation and photic stimulation were not performed.   ABNORMALITY - Intermittent slow, generalized IMPRESSION: This study is suggestive of mild to moderate diffuse encephalopathy. No seizures or epileptiform discharges were seen throughout the recording. [name]   MR BRAIN WO CONTRAST  Result Date: 12/29/2022 CLINICAL DATA:  Altered mental status EXAM: MRI HEAD WITHOUT CONTRAST TECHNIQUE: Multiplanar, multiecho pulse sequences of the brain and surrounding structures were obtained without intravenous contrast. COMPARISON:  10/12/2022 FINDINGS: Brain: No restricted diffusion to suggest acute or subacute infarct. No acute hemorrhage, mass, mass effect, or midline shift. No hydrocephalus or extra-axial collection. Pituitary and craniocervical junction within normal limits. Redemonstrated hemosiderin staining in the right occipital lobe. Confluent T2 hyperintense signal in the periventricular white matter and pons, likely the sequela of moderate chronic small vessel ischemic disease. Vascular: Normal arterial flow voids. Skull and upper cervical spine: Normal marrow signal. Sinuses/Orbits: Clear paranasal sinuses. No acute finding in the orbits. Status post bilateral lens replacements. Other: The mastoid air cells are well aerated. IMPRESSION: No acute intracranial process. No evidence of acute or subacute infarct. Electronically Signed   By: Wiliam Ke M.D.   On: 12/29/2022 01:42   ECHOCARDIOGRAM LIMITED  Result Date: 12/24/2022    ECHOCARDIOGRAM LIMITED REPORT   Patient Name:   KAMARRA SIRMON Date of Exam:  12/24/2022 Medical Rec #:  [id number]       Height:       65.0 in Accession #:    [id number]      Weight:       125.7 lb Date of Birth:  Dec 08, 1940       BSA:          1.624 m Patient Age:    82 years        BP:           160/63 mmHg Patient Gender: F               HR:           68 bpm. Exam Location:  Inpatient Procedure: Limited Echo, Limited Color Doppler and Cardiac Doppler Indications:    Atrial fibrillation  History:        Patient has prior history of Echocardiogram examinations, most                 recent 10/13/2022. Risk Factors:Hypertension, Dyslipidemia and                 Diabetes. CKD.  Sonographer:    Milda Smart Referring Phys: [id number] PRANAV M PATEL  Sonographer Comments: Suboptimal subcostal window. Image acquisition challenging due to respiratory motion. IMPRESSIONS  1. Left ventricular ejection fraction, by estimation, is 60 to 65%. The left ventricle has normal function.  2. Right ventricular systolic function is normal. The right ventricular size is normal. Tricuspid regurgitation signal is inadequate for assessing PA pressure.  3. The mitral valve is grossly normal. Trivial mitral valve regurgitation. No evidence of mitral stenosis.  4. The aortic valve is tricuspid. There is mild calcification of the aortic valve. Aortic valve  regurgitation is trivial. Aortic valve sclerosis is present, with no evidence of aortic valve stenosis.  5. The inferior vena cava is normal in size with greater than 50% respiratory variability, suggesting right atrial pressure of 3 mmHg. Comparison(s): No significant change from prior study. FINDINGS  Left Ventricle: Left ventricular ejection fraction, by estimation, is 60 to 65%. The left ventricle has normal function. The left ventricular internal cavity size was normal in size. There is no left ventricular hypertrophy. Right Ventricle: The right ventricular size is normal. No increase in right ventricular wall thickness. Right ventricular systolic function is  normal. Tricuspid regurgitation signal is inadequate for assessing PA pressure. Pericardium: There is no evidence of pericardial effusion. Presence of epicardial fat layer. Mitral Valve: The mitral valve is grossly normal. Trivial mitral valve regurgitation. No evidence of mitral valve stenosis. Tricuspid Valve: The tricuspid valve is grossly normal. Tricuspid valve regurgitation is trivial. No evidence of tricuspid stenosis. Aortic Valve: The aortic valve is tricuspid. There is mild calcification of the aortic valve. Aortic valve regurgitation is trivial. Aortic valve sclerosis is present, with no evidence of aortic valve stenosis. Pulmonic Valve: The pulmonic valve was grossly normal. Pulmonic valve regurgitation is trivial. No evidence of pulmonic stenosis. Venous: The inferior vena cava is normal in size with greater than 50% respiratory variability, suggesting right atrial pressure of 3 mmHg. Additional Comments: Spectral Doppler performed. Color Doppler performed.  LEFT VENTRICLE PLAX 2D LVIDd:         3.90 cm LVIDs:         2.50 cm LV PW:         1.00 cm LV IVS:        0.90 cm  LEFT ATRIUM         Index LA diam:    3.40 cm 2.09 cm/m  AORTIC VALVE LVOT Vmax:   129.00 cm/s LVOT Vmean:  93.400 cm/s LVOT VTI:    0.275 m  SHUNTS Systemic VTI: 0.28 m Lennie Odor MD Electronically signed by Lennie Odor MD Signature Date/Time: 12/24/2022/5:06:50 PM    Final     Assessment/Plan  Sorethroat  Pt c/o sorethroat since this morning Denies fevers, chills, sinus pain, ear pain Afebrile Denies Sob Does not appear to be distress Tolerating po food Will start  cephacol lozenges    Family/ staff Communication: care plan discussed with the nursing facility  Labs/tests ordered:  none  30  Total time spent for obtaining history,  performing a medically appropriate examination and evaluation, reviewing the tests, counseling and educating the patient/family/caregiver, ordering medications, , documenting clinical  information in the electronic or other health record, independently interpreting results and communicating results to the patient/family/caregiver, care coordination (not separately reported)

## 2023-01-26 ENCOUNTER — Emergency Department (HOSPITAL_COMMUNITY)
Admission: EM | Admit: 2023-01-26 | Discharge: 2023-01-26 | Disposition: A | Discharge status: Home / Self Care | Payer: Medicare PPO | Attending: Emergency Medicine | Admitting: Emergency Medicine

## 2023-01-26 ENCOUNTER — Encounter: Payer: Self-pay | Admitting: Sports Medicine

## 2023-01-26 ENCOUNTER — Other Ambulatory Visit: Payer: Self-pay

## 2023-01-26 ENCOUNTER — Encounter (HOSPITAL_COMMUNITY): Payer: Self-pay | Admitting: Emergency Medicine

## 2023-01-26 ENCOUNTER — Non-Acute Institutional Stay (SKILLED_NURSING_FACILITY): Payer: Self-pay | Admitting: Sports Medicine

## 2023-01-26 DIAGNOSIS — K1379 Other lesions of oral mucosa: Secondary | ICD-10-CM | POA: Insufficient documentation

## 2023-01-26 DIAGNOSIS — R531 Weakness: Secondary | ICD-10-CM | POA: Diagnosis not present

## 2023-01-26 DIAGNOSIS — Z7982 Long term (current) use of aspirin: Secondary | ICD-10-CM | POA: Diagnosis not present

## 2023-01-26 DIAGNOSIS — R1111 Vomiting without nausea: Secondary | ICD-10-CM | POA: Diagnosis not present

## 2023-01-26 DIAGNOSIS — K068 Other specified disorders of gingiva and edentulous alveolar ridge: Secondary | ICD-10-CM | POA: Diagnosis not present

## 2023-01-26 DIAGNOSIS — Z7901 Long term (current) use of anticoagulants: Secondary | ICD-10-CM | POA: Insufficient documentation

## 2023-01-26 DIAGNOSIS — Z7401 Bed confinement status: Secondary | ICD-10-CM | POA: Diagnosis not present

## 2023-01-26 DIAGNOSIS — L7622 Postprocedural hemorrhage and hematoma of skin and subcutaneous tissue following other procedure: Secondary | ICD-10-CM | POA: Diagnosis not present

## 2023-01-26 LAB — BASIC METABOLIC PANEL
Anion gap: 5 (ref 5–15)
BUN: 9 mg/dL (ref 8–23)
CO2: 21 mmol/L — ABNORMAL LOW (ref 22–32)
Calcium: 8.8 mg/dL — ABNORMAL LOW (ref 8.9–10.3)
Chloride: 109 mmol/L (ref 98–111)
Creatinine, Ser: 0.95 mg/dL (ref 0.44–1.00)
GFR, Estimated: 59 mL/min — ABNORMAL LOW (ref >60)
Glucose, Bld: 98 mg/dL (ref 70–99)
Potassium: 4.1 mmol/L (ref 3.5–5.1)
Sodium: 135 mmol/L (ref 135–145)

## 2023-01-26 LAB — CBC WITH DIFFERENTIAL/PLATELET
Abs Immature Granulocytes: 0.02 10*3/uL (ref 0.00–0.07)
Basophils Absolute: 0 10*3/uL (ref 0.0–0.1)
Basophils Relative: 1 %
Eosinophils Absolute: 0.2 10*3/uL (ref 0.0–0.5)
Eosinophils Relative: 4 %
HCT: 26.1 % — ABNORMAL LOW (ref 36.0–46.0)
Hemoglobin: 7.9 g/dL — ABNORMAL LOW (ref 12.0–15.0)
Immature Granulocytes: 0 %
Lymphocytes Relative: 21 %
Lymphs Abs: 1.3 10*3/uL (ref 0.7–4.0)
MCH: 28.6 pg (ref 26.0–34.0)
MCHC: 30.3 g/dL (ref 30.0–36.0)
MCV: 94.6 fL (ref 80.0–100.0)
Monocytes Absolute: 0.5 10*3/uL (ref 0.1–1.0)
Monocytes Relative: 9 %
Neutro Abs: 3.9 10*3/uL (ref 1.7–7.7)
Neutrophils Relative %: 65 %
Platelets: 263 10*3/uL (ref 150–400)
RBC: 2.76 MIL/uL — ABNORMAL LOW (ref 3.87–5.11)
RDW: 15.1 % (ref 11.5–15.5)
WBC: 6 10*3/uL (ref 4.0–10.5)
nRBC: 0 % (ref 0.0–0.2)

## 2023-01-26 NOTE — ED Triage Notes (Addendum)
Pt arrived via EMS from [facility]. Pt c/o bleeding from mouth x2 days. Facility staff also reports coughing up blood clots. Pt states she vomited this morning and it was "completely clear" per EMS. Pt has dried blood around her mouth but no obvious bleeding noted. Pt started taking Rivaroxaban on 01/09/23 per facility paperwork.   EMS vitals:  BP 150/75 HR 66 SpO2 95% RA FSBS 110

## 2023-01-26 NOTE — Discharge Instructions (Signed)
I did not see any obvious cause of your bleeding on exam.  Your blood level is stable.  Please discuss this with your family doctor.  They may elect to stop your blood thinning medication or your aspirin.  Please return for recurrent bleeding especially if it does not seem to be stopping.

## 2023-01-26 NOTE — Progress Notes (Signed)
Provider:   Venita Sheffield MD Location:   [facility]   Place of Service:  skilled care   PCP: Sigmund Hazel, MD Patient Care Team: Sigmund Hazel, MD as PCP - General (Family Medicine) Tessa Lerner, DO as PCP - Cardiology (Cardiology) Glyn Ade, PA-C as Physician Assistant (Dermatology)  Extended Emergency Contact Information Primary Emergency Contact: [name], [place] [address] [place] of [place] Home Phone: [phone] Work Phone: [phone] Mobile Phone: [phone] Relation: Son Secondary Emergency Contact: Holz,[name]  [place] Phone: [phone] Relation: Son  Code Status:  Goals of Care: Advanced Directive information    01/26/2023    6:54 AM  Advanced Directives  Does Patient Have a Medical Advance Directive? Yes  Does patient want to make changes to medical advance directive? No - Patient declined      No chief complaint on file.   HPI: Patient is a 82 y.o. female seen today for  ED follow up  Staff reported that pt went to ED this morning for bleeding from her gums  Pt went to therapy and came back Pt seen and examined in her room, she is bleeding from her gums again  Denies pain in her gums  Denies nasal congestion, nose bleeds, cough, sob, abdominal pain, nausea, vomiting.      Latest Ref Rng & Units 01/26/2023    7:18 AM 01/19/2023   12:00 AM 01/16/2023   12:00 AM  CBC  WBC 4.0 - 10.5 K/uL 6.0  6.7     5.5      Hemoglobin 12.0 - 15.0 g/dL 7.9  8.0     8.1      Hematocrit 36.0 - 46.0 % 26.1  24     25       Platelets 150 - 400 K/uL 263  324     315         This result is from an external source.     Past Medical History:  Diagnosis Date   CKD (chronic kidney disease), stage II    nephrologist-- dr Kathrene Bongo  [facility])   Diabetes mellitus type 2, diet-controlled (HCC)    followed by pcp---   (09-20-2019  per pt does not check blood sugar at home)    Fracture of humeral shaft, right, closed 04/25/2022   GERD (gastroesophageal reflux disease)    History of primary hyperparathyroidism    s/p  parathyroidectomy right inferior on 12-02-2012   Humerus fracture 04/25/2022   Hyperlipidemia    Hypertension    followd by nephrologist----  (09-20-2019  per pt had stress test done some time ago , unsure when/ where, thinks told ok)   OA (osteoarthritis)    Osteoporosis    Prosthetic joint infection (HCC)    followed by dr j. hatcher (ID)   left total shoulder arthroplasty 12/ 2016  ; 12/ 2020  revision with explant joint replacement and hemiarthroplasty;  completed 6 month antibiotic 05/ 2021   Vertigo    Past Surgical History:  Procedure Laterality Date   COLONOSCOPY     DILATATION & CURETTAGE/HYSTEROSCOPY WITH MYOSURE N/A 09/28/2019   Procedure: DILATATION & CURETTAGE/HYSTEROSCOPY WITH MYOSURE;  Surgeon: Olivia Mackie, MD;  Location: [facility];  Service: Gynecology;  Laterality: N/A;   EYE SURGERY  yrs ago   laser surgery for retinal tear.  (unilateral , pt unsure which side)   ORIF HUMERUS FRACTURE Right 04/27/2022   Procedure:  OPEN REDUCTION INTERNAL FIXATION (ORIF) PROXIMAL HUMERUS FRACTURE;  Surgeon: Bjorn Pippin, MD;  Location: [facility];  Service: Orthopedics;  Laterality: Right;   PARATHYROIDECTOMY N/A 12/02/2012   Procedure: PARATHYROIDECTOMY with frozen section ;  Surgeon: Velora Heckler, MD;  Location: [facility];  Service: General;  Laterality: N/A;   REVERSE SHOULDER ARTHROPLASTY Right 04/27/2022   Procedure: REVERSE SHOULDER ARTHROPLASTY;  Surgeon: Bjorn Pippin, MD;  Location: [facility];  Service: Orthopedics;  Laterality: Right;   SHOULDER INJECTION Left 07/27/2013   Procedure: SHOULDER INJECTION;  Surgeon: Loreta Ave, MD;  Location[facility] OR;  Service: Orthopedics;  Laterality: Left;   STERIOD INJECTION Right 02/14/2015   Procedure: RIGHT SHOULDER CORTISONE INJECTION;  Surgeon: Loreta Ave, MD;  Location: [facility];   Service: Orthopedics;  Laterality: Right;   TOTAL HIP ARTHROPLASTY Left 07/27/2013   Procedure: TOTAL HIP ARTHROPLASTY ANTERIOR APPROACH;  Surgeon: Loreta Ave, MD;  Location: [facility] OR;  Service: Orthopedics;  Laterality: Left;   TOTAL SHOULDER ARTHROPLASTY Left 02/14/2015   Procedure: LEFT TOTAL SHOULDER ARTHROPLASTY;  Surgeon: Loreta Ave, MD;  [facility];  Service: Orthopedics;  Laterality: Left;   TOTAL SHOULDER REVISION Left 02/09/2019   Procedure: TOTAL SHOULDER REVISION;  Surgeon: Bjorn Pippin, MD;  Location: [facility];  Service: Orthopedics;  Laterality: Left;    reports that she quit smoking about 35 years ago. Her smoking use included cigarettes. She started smoking about 55 years ago. She has a 20 pack-year smoking history. She has never used smokeless tobacco. She reports that she does not drink alcohol and does not use drugs. Social History   Socioeconomic History   Marital status: Widowed    Spouse name: Not on file   Number of children: Not on file   Years of education: Not on file   Highest education level: Not on file  Occupational History   Not on file  Tobacco Use   Smoking status: Former    Current packs/day: 0.00    Average packs/day: 1 pack/day for 20.0 years (20.0 ttl pk-yrs)    Types: Cigarettes    Start date: 10/28/1967    Quit date: 10/28/1987    Years since quitting: 35.2   Smokeless tobacco: Never  Vaping Use   Vaping status: Never Used  Substance and Sexual Activity   Alcohol use: No   Drug use: Never   Sexual activity: Yes    Birth control/protection: Post-menopausal  Other Topics Concern   Not on file  Social History Narrative   Not on file   Social Determinants of Health   Financial Resource Strain: Not on file  Food Insecurity: No Food Insecurity (12/22/2022)   Hunger Vital Sign    Worried About Running Out of Food in the Last Year: Never true    Ran Out of Food in the Last Year: Never true  Transportation Needs: No Transportation  Needs (12/22/2022)   PRAPARE - Administrator, Civil Service (Medical): No    Lack of Transportation (Non-Medical): No  Physical Activity: Not on file  Stress: Not on file  Social Connections: Not on file  Intimate Partner Violence: Not At Risk (12/22/2022)   Humiliation, Afraid, Rape, and Kick questionnaire    Fear of Current or Ex-Partner: No    Emotionally Abused: No    Physically Abused: No    Sexually Abused: No    Functional Status Survey:    Family History  Problem Relation Age of Onset  Heart failure Mother    Cancer Mother    Breast cancer Mother        in her 69s   Cancer Father     Health Maintenance  Topic Date Due   OPHTHALMOLOGY EXAM  Never done   Diabetic kidney evaluation - Urine ACR  01/26/2014   Zoster Vaccines- Shingrix (2 of 2) 03/11/2019   FOOT EXAM  10/01/2022   INFLUENZA VACCINE  10/09/2022   COVID-19 Vaccine (4 - 2023-24 season) 11/09/2022   Medicare Annual Wellness (AWV)  02/22/2023   HEMOGLOBIN A1C  04/15/2023   Diabetic kidney evaluation - eGFR measurement  01/26/2024   DTaP/Tdap/Td (4 - Td or Tdap) 06/09/2027   Pneumonia Vaccine 67+ Years old  Completed   DEXA SCAN  Completed   HPV VACCINES  Aged Out    Allergies  Allergen Reactions   Abaloparatide Other (See Comments)    "Burred vision, lightheaded" (patient does not recall this in 2024)   Mobic [Meloxicam] Hypertension   Crestor [Rosuvastatin] Other (See Comments)    Muscle aches   Norvasc [Amlodipine Besylate] Swelling and Other (See Comments)    Ankle swelling   Simvastatin Other (See Comments)    Muscle aches, high LFTs   Sulfa Antibiotics Rash   Zetia [Ezetimibe] Other (See Comments)    Muscle aches    Outpatient Encounter Medications as of 01/26/2023  Medication Sig   acetaminophen (TYLENOL) 325 MG tablet Take 650 mg by mouth every 4 (four) hours as needed for moderate pain (pain score 4-6).   allopurinol (ZYLOPRIM) 300 MG tablet Take 300 mg by mouth daily.    aspirin EC 81 MG tablet Take 1 tablet (81 mg total) by mouth daily. Swallow whole.   carbamide peroxide (DEBROX) 6.5 % OTIC solution Place 5 drops into both ears daily.   cloNIDine (CATAPRES) 0.1 MG tablet Take 0.1 mg by mouth daily as needed (for hypertension- if systolic b/p is over 160).   feeding supplement (ENSURE ENLIVE / ENSURE PLUS) LIQD Take 237 mLs by mouth 2 (two) times daily between meals.   ferrous sulfate 325 (65 FE) MG tablet Take 325 mg by mouth See admin instructions. Take 325 mg by mouth by mouth once daily on Monday, Wednesday, and Friday   folic acid (FOLVITE) 1 MG tablet Take 1 mg by mouth daily.   hydrALAZINE (APRESOLINE) 100 MG tablet Take 1 tablet (100 mg total) by mouth 3 (three) times daily.   magnesium oxide (MAG-OX) 400 (240 Mg) MG tablet Take 1 tablet (400 mg total) by mouth daily.   meclizine (ANTIVERT) 12.5 MG tablet Take 1 tablet (12.5 mg total) by mouth 3 (three) times daily as needed for dizziness. (Patient taking differently: Take 12.5 mg by mouth 3 (three) times daily as needed for dizziness (vertigo).)   pantoprazole (PROTONIX) 40 MG tablet Take 1 tablet (40 mg total) by mouth 2 (two) times daily before a meal. Take 40 mg by mouth in the morning and at 5 PM- BEFORE FOOD   polyethylene glycol (MIRALAX / GLYCOLAX) 17 g packet Take 17 g by mouth 2 (two) times daily. (Patient not taking: Reported on 01/20/2023)   Rivaroxaban (XARELTO) 15 MG TABS tablet Take 1 tablet (15 mg total) by mouth daily with supper. (Patient taking differently: Take 15 mg by mouth every evening.)   senna-docusate (SENOKOT-S) 8.6-50 MG tablet Take 1 tablet by mouth 2 (two) times daily. (Patient not taking: Reported on 01/20/2023)   tamsulosin (FLOMAX) 0.4 MG CAPS capsule Take  1 capsule (0.4 mg total) by mouth daily.   Zinc Oxide 10 % OINT Apply 1 Application topically See admin instructions. 1 application to buttocks every shift for redness   No facility-administered encounter medications on  file as of 01/26/2023.    Review of Systems  Constitutional:  Negative for fever.  HENT:  Negative for sinus pressure and sore throat.   Respiratory:  Negative for cough, shortness of breath and wheezing.   Cardiovascular:  Negative for chest pain, palpitations and leg swelling.  Gastrointestinal:  Negative for abdominal distention, abdominal pain, blood in stool, constipation, diarrhea, nausea and vomiting.  Genitourinary:  Negative for dysuria, frequency and urgency.  Neurological:  Negative for dizziness, weakness and numbness.  Psychiatric/Behavioral:  Negative for confusion.     There were no vitals filed for this visit. There is no height or weight on file to calculate BMI. Physical Exam Constitutional:      Appearance: Normal appearance.  HENT:     Head: Normocephalic and atraumatic.     Mouth/Throat:     Comments: Bleeding from gums Cardiovascular:     Rate and Rhythm: Normal rate and regular rhythm.     Heart sounds: No murmur heard. Pulmonary:     Effort: Pulmonary effort is normal. No respiratory distress.     Breath sounds: Normal breath sounds. No wheezing.  Abdominal:     General: Bowel sounds are normal. There is no distension.     Tenderness: There is no abdominal tenderness. There is no guarding or rebound.  Musculoskeletal:        General: No swelling or tenderness.  Skin:    General: Skin is dry.  Neurological:     Mental Status: She is alert. Mental status is at baseline.     Sensory: No sensory deficit.     Motor: No weakness.    Labs reviewed: Basic Metabolic Panel: Recent Labs    01/06/23 0926 01/07/23 0425 01/08/23 0413 01/16/23 0000 01/19/23 0000 01/26/23 0718  NA 134* 134* 135 135* 137 135  K 3.6 3.6 3.1* 3.4* 3.4* 4.1  CL 106 106 106 105 105 109  CO2 22 19* 22  --  23* 21*  GLUCOSE 115* 89 95  --   --  98  BUN 14 13 13 10 8 9   CREATININE 0.62 0.65 0.61 0.6 0.5 0.95  CALCIUM 9.9 9.2 9.1 8.3* 8.4* 8.8*  MG 1.9 2.2 2.4  --   --   --    PHOS 2.0* 2.6 2.2*  --   --   --    Liver Function Tests: Recent Labs    10/13/22 0738 12/24/22 0347 12/28/22 1256 01/01/23 0416 01/08/23 0413 01/16/23 0000 01/19/23 0000  AST 14*  --  17  --  17 11* 10*  ALT 10  --  14  --  11 7 3*  ALKPHOS 56  --  85  --  64 72 79  BILITOT 1.2  --  0.8  --  0.6  --   --   PROT 5.1*  --  6.5  --  5.6*  --   --   ALBUMIN 2.6*   < > 3.0*   < > 2.7* 2.5* 2.6*   < > = values in this interval not displayed.   Recent Labs    10/12/22 2013  LIPASE 26   Recent Labs    10/12/22 2104 12/28/22 1256  AMMONIA <10 <10   CBC: Recent Labs    01/07/23  0425 01/08/23 0413 01/16/23 0000 01/19/23 0000 01/26/23 0718  WBC 6.4 8.8 5.5 6.7 6.0  NEUTROABS  --  6.5 3,641.00 4,730.00 3.9  HGB 8.8* 9.2* 8.1* 8.0* 7.9*  HCT 29.3* 29.3* 25* 24* 26.1*  MCV 99.3 96.7  --   --  94.6  PLT 231 254 315 324 263   Cardiac Enzymes: No results for input(s): "CKTOTAL", "CKMB", "CKMBINDEX", "TROPONINI" in the last 8760 hours. BNP: Invalid input(s): "POCBNP" Lab Results  Component Value Date   HGBA1C 6.0 (H) 10/13/2022   Lab Results  Component Value Date   TSH 1.499 12/28/2022   Lab Results  Component Value Date   VITAMINB12 359 01/19/2023   Lab Results  Component Value Date   FOLATE 11.7 01/03/2023   Lab Results  Component Value Date   IRON 16 01/19/2023   TIBC 155 01/19/2023   FERRITIN 88 01/19/2023    Imaging and Procedures obtained prior to SNF admission: No results found.  Assessment/Plan   1. Bleeding gums Pt had another episode this after noon with bleeding from her gums Will check cbc She has plaques around her tooth She has an appt to see dentist this afternoon.  Family/ staff Communication:  care plan discussed with the nursing staff  Labs/tests ordered: cbc  30  Total time spent for obtaining history,  performing a medically appropriate examination and evaluation, reviewing the tests,, ordering medications, tests, or  procedures, referring and communicating with other health care professionals  , documenting clinical information in the electronic or other health record, independently interpreting results and communicating results to the patient/family/caregiver, care coordination (not separately reported)

## 2023-01-26 NOTE — ED Notes (Signed)
Ptar called eta 

## 2023-01-26 NOTE — ED Provider Notes (Signed)
[facility] EMERGENCY DEPARTMENT AT [facility] Provider Note   CSN: [id number] Arrival date & time: 01/26/23  4098     History  Chief Complaint  Patient presents with   Bleeding/Bruising    Tina Frank is a 81 y.o. female.  82 yo F with a chief complaints of blood coming from her mouth.  This has been going on for about 24 to 48 hours.  She was recently started on Xarelto.  She is not really sure why.  She said she was recently in the hospital and had to go to rehab.  She has been on Xarelto since then.  She also takes a baby aspirin.  She has no pain in the mouth denies nosebleeds denies vomiting of blood.  She denies dark stool or blood in her stool.  She says her stool was tested for blood at her rehab facility but she is not sure of the results.  She had worsening bleeding this evening.  Seems to have stopped.        Home Medications Prior to Admission medications   Medication Sig Start Date End Date Taking? Authorizing Provider  allopurinol (ZYLOPRIM) 300 MG tablet Take 300 mg by mouth daily.   Yes [provider]  aspirin EC 81 MG tablet Take 1 tablet (81 mg total) by mouth daily. Swallow whole. 12/26/22  Yes Rolly Salter, MD  cloNIDine (CATAPRES) 0.1 MG tablet Take 0.1 mg by mouth daily as needed (for hypertension- if systolic b/p is over 160).   Yes [provider]  ferrous sulfate 325 (65 FE) MG tablet Take 325 mg by mouth See admin instructions. Take 325 mg by mouth by mouth once daily on Monday, Wednesday, and Friday   Yes [provider]  folic acid (FOLVITE) 1 MG tablet Take 1 mg by mouth daily.   Yes [provider]  hydrALAZINE (APRESOLINE) 100 MG tablet Take 1 tablet (100 mg total) by mouth 3 (three) times daily. 01/06/23  Yes Rodolph Bong, MD  magnesium oxide (MAG-OX) 400 (240 Mg) MG tablet Take 1 tablet (400 mg total) by mouth daily. 10/18/22  Yes Burnadette Pop, MD  meclizine (ANTIVERT) 12.5 MG tablet  Take 1 tablet (12.5 mg total) by mouth 3 (three) times daily as needed for dizziness. Patient taking differently: Take 12.5 mg by mouth 3 (three) times daily as needed for dizziness (vertigo). 01/06/23  Yes Rodolph Bong, MD  pantoprazole (PROTONIX) 40 MG tablet Take 1 tablet (40 mg total) by mouth 2 (two) times daily before a meal. Take 40 mg by mouth in the morning and at 5 PM- BEFORE FOOD 12/26/22  Yes Rolly Salter, MD  Rivaroxaban (XARELTO) 15 MG TABS tablet Take 1 tablet (15 mg total) by mouth daily with supper. Patient taking differently: Take 15 mg by mouth every evening. 12/26/22  Yes Rolly Salter, MD  tamsulosin (FLOMAX) 0.4 MG CAPS capsule Take 1 capsule (0.4 mg total) by mouth daily. 01/07/23  Yes Rodolph Bong, MD  Zinc Oxide 10 % OINT Apply 1 Application topically See admin instructions. 1 application to buttocks every shift for redness   Yes [provider]  acetaminophen (TYLENOL) 325 MG tablet Take 650 mg by mouth every 4 (four) hours as needed for moderate pain (pain score 4-6).    [provider]  carbamide peroxide (DEBROX) 6.5 % OTIC solution Place 5 drops into both ears daily.    [provider]  feeding supplement (ENSURE  ENLIVE / ENSURE PLUS) LIQD Take 237 mLs by mouth 2 (two) times daily between meals. 01/07/23   Rodolph Bong, MD  polyethylene glycol (MIRALAX / GLYCOLAX) 17 g packet Take 17 g by mouth 2 (two) times daily. Patient not taking: Reported on 01/20/2023 01/06/23   Rodolph Bong, MD  senna-docusate (SENOKOT-S) 8.6-50 MG tablet Take 1 tablet by mouth 2 (two) times daily. Patient not taking: Reported on 01/20/2023 01/06/23   Rodolph Bong, MD      Allergies    Abaloparatide, Mobic [meloxicam], Crestor [rosuvastatin], Norvasc [amlodipine besylate], Simvastatin, Sulfa antibiotics, and Zetia [ezetimibe]    Review of Systems   Review of Systems  Physical Exam Updated Vital Signs BP 139/78 (BP Location:  Right Arm)   Pulse 72   Temp 98.2 F (36.8 C) (Oral)   Resp 18   SpO2 98%  Physical Exam Vitals and nursing note reviewed.  Constitutional:      General: She is not in acute distress.    Appearance: She is well-developed. She is not diaphoretic.     Comments: pale  HENT:     Head: Normocephalic and atraumatic.     Mouth/Throat:     Comments: There is some dried blood on the lips.  I do not appreciate any obvious intraoral areas of bleeding.  No obvious intraoral injury.  No obvious signs of epistaxis.  No obvious blood in the posterior oropharynx. Eyes:     Pupils: Pupils are equal, round, and reactive to light.  Cardiovascular:     Rate and Rhythm: Normal rate and regular rhythm.     Heart sounds: No murmur heard.    No friction rub. No gallop.  Pulmonary:     Effort: Pulmonary effort is normal.     Breath sounds: No wheezing or rales.  Abdominal:     General: There is no distension.     Palpations: Abdomen is soft.     Tenderness: There is no abdominal tenderness.  Musculoskeletal:        General: No tenderness.     Cervical back: Normal range of motion and neck supple.  Skin:    General: Skin is warm and dry.  Neurological:     Mental Status: She is alert and oriented to person, place, and time.  Psychiatric:        Behavior: Behavior normal.     ED Results / Procedures / Treatments   Labs (all labs ordered are listed, but only abnormal results are displayed) Labs Reviewed  CBC WITH DIFFERENTIAL/PLATELET - Abnormal; Notable for the following components:      Result Value   RBC 2.76 (*)    Hemoglobin 7.9 (*)    HCT 26.1 (*)    All other components within normal limits  BASIC METABOLIC PANEL - Abnormal; Notable for the following components:   CO2 21 (*)    Calcium 8.8 (*)    GFR, Estimated 59 (*)    All other components within normal limits    EKG None  Radiology No results found.  Procedures Procedures    Medications Ordered in ED Medications -  No data to display  ED Course/ Medical Decision Making/ A&P                                 Medical Decision Making Amount and/or Complexity of Data Reviewed Labs: ordered.   82 yo F with a chief  complaints of blood coming out of her mouth.  This has been off and on for about 24 hours.  It sounds like it was significantly worse just prior to arrival here but resolved spontaneously.  I do not see any obvious source of the bleeding.  Will obtain a laboratory evaluation as the patient appears quite pale.  Reassess.  On my record review the patient was started on Xarelto due to atrial fibrillation.  Patient's hemoglobin is 7.9, last check was 8.  Patient with no significant electrolyte abnormalities.  Patient was rechecked without continued bleeding.  Discussed results with her.  Will discharge home.  PCP follow-up.  8:31 AM:  I have discussed the diagnosis/risks/treatment options with the patient.  Evaluation and diagnostic testing in the emergency department does not suggest an emergent condition requiring admission or immediate intervention beyond what has been performed at this time.  They will follow up with PCP. We also discussed returning to the ED immediately if new or worsening sx occur. We discussed the sx which are most concerning (e.g., sudden worsening pain, fever, inability to tolerate by mouth) that necessitate immediate return. Medications administered to the patient during their visit and any new prescriptions provided to the patient are listed below.  Medications given during this visit Medications - No data to display   The patient appears reasonably screen and/or stabilized for discharge and I doubt any other medical condition or other [facility] requiring further screening, evaluation, or treatment in the ED at this time prior to discharge.          Final Clinical Impression(s) / ED Diagnoses Final diagnoses:  Oral bleeding    Rx / DC Orders ED Discharge Orders      None         Melene Plan, DO 01/26/23 2952

## 2023-01-28 ENCOUNTER — Non-Acute Institutional Stay (SKILLED_NURSING_FACILITY): Payer: Self-pay | Admitting: Nurse Practitioner

## 2023-01-28 ENCOUNTER — Encounter: Payer: Self-pay | Admitting: Cardiovascular Disease

## 2023-01-28 ENCOUNTER — Telehealth: Payer: Self-pay | Admitting: Cardiology

## 2023-01-28 DIAGNOSIS — D472 Monoclonal gammopathy: Secondary | ICD-10-CM

## 2023-01-28 DIAGNOSIS — K219 Gastro-esophageal reflux disease without esophagitis: Secondary | ICD-10-CM

## 2023-01-28 DIAGNOSIS — D509 Iron deficiency anemia, unspecified: Secondary | ICD-10-CM | POA: Diagnosis not present

## 2023-01-28 DIAGNOSIS — Z8673 Personal history of transient ischemic attack (TIA), and cerebral infarction without residual deficits: Secondary | ICD-10-CM

## 2023-01-28 DIAGNOSIS — I4891 Unspecified atrial fibrillation: Secondary | ICD-10-CM | POA: Diagnosis not present

## 2023-01-28 DIAGNOSIS — I10 Essential (primary) hypertension: Secondary | ICD-10-CM | POA: Diagnosis not present

## 2023-01-28 DIAGNOSIS — Z79899 Other long term (current) drug therapy: Secondary | ICD-10-CM

## 2023-01-28 NOTE — Telephone Encounter (Signed)
Call from staff Tywan at [facility] who are asking if patient should continue xarelto and aspirin as patient had visit to ED on 01/26/23 for bleeding gums and hgb of 7.9. Pt last saw Dr. Odis Hollingshead 07/24/22. Pt has f/u with Dr. Odis Hollingshead 02/04/23, staff at [facility] requesting sooner visit.  Spoke with Dr. Odis Hollingshead who found record of visit to ED for stroke 12/26/22. It appears aspirin and xarelto were started at this time. Called Tywan back, was not able to reach her, left detailed message advising per Dr. Odis Hollingshead to stop aspirin. Also advised we had scheduled patient for a DOD visit 01/30/23 at 10:30 with Dr. Elease Hashimoto. Requested BMET to be done prior.  Also called Jackelyn Poling, social worker at [facility] and advised of same. Katie agrees to appt on 01/30/23 at 10:30, states that signed order for BMET would need to be sent to [facility]. Order printed and faxed to [phone]. Advised patient should stop aspirin. Katie verbalized understanding.

## 2023-01-28 NOTE — Telephone Encounter (Signed)
Pt c/o medication issue:  1. Name of Medication:   Rivaroxaban (XARELTO) 15 MG TABS tablet    aspirin EC 81 MG tablet    2. How are you currently taking this medication (dosage and times per day)? As directed  3. Are you having a reaction (difficulty breathing--STAT)? No  4. What is your medication issue? Patients gums are bleeding, she went to the ED on 11/18. Her hemoglobin is dropping, it was an 8 on 11/12 and yesterday on 11/19 it was a 7.4. Tywan from [facility] called in requesting an appointment to discuss coming off of one of the blood thinners. Patient scheduled for 11/27 at 1:15pm with Dr. Odis Hollingshead.

## 2023-01-28 NOTE — Assessment & Plan Note (Signed)
Blood pressure is managed  on Hydralazine,  prn Clonidine

## 2023-01-28 NOTE — Assessment & Plan Note (Signed)
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

## 2023-01-28 NOTE — Assessment & Plan Note (Addendum)
Hgb 7.411/19/24<< 8.0 01/20/23<<8.1 01/15/23, added Vit B12(Vit B12 273 12/10/22) and Fe. Gum bleed, followed by dentist.  Will hold Xarelto, ASA if HPOA and Cardiology agree Repeat CBC in am Cardiology:  see her 01/30/23, hold ASA, continue Xarelto, adding BMP in am

## 2023-01-28 NOTE — Assessment & Plan Note (Signed)
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

## 2023-01-28 NOTE — Addendum Note (Signed)
Addended by: Luellen Pucker on: 01/28/2023 02:43 PM   Modules accepted: Orders

## 2023-01-28 NOTE — Assessment & Plan Note (Signed)
Heart rate is in control, hold Xarelto if HPOA/cardiology agree

## 2023-01-28 NOTE — Assessment & Plan Note (Signed)
Stable, continue Pantoprazole.  

## 2023-01-28 NOTE — Progress Notes (Signed)
  Cardiology Office Note:  .   Date:  01/30/2023  ID:  Tina Frank, DOB Feb 18, 1941, MRN [id number] PCP: Sigmund Hazel, MD  [facility] Providers Cardiologist:  Tessa Lerner, DO    History of Present Illness: Tina Frank   Tina Frank is a 82 y.o. female bradycardia, atrial fib,  HLD, gout Trivial MR MGUS (monoclonal gammopathy of unknown significance) -IgM kappa MGUS  Seen with her son, Lorin Picket.  Was sent here for low Hb  Was having bleeding bums  Saw her dentist  The bleeding has now stopped  She is still taking the xarleto Has stopped the ASA   No CP , no dyspnea  Is taking oral liquid iron.     Had blood drawn this am at [facility]  Her hemoglobin dropped as low as 7.9 during her hospitalization.  Her hemoglobin from yesterday drawn at friend's home was 9.2.  I have talked to her and her son and him quite pleased with her hemoglobin.  She will not need to get any further acute checks at the cardiology office.  She will need to keep her regular appointment with Dr. Jovita Gamma in Jan. 1, 2025 ( Cancel the appt with Dr. Odis Hollingshead next week .      ROS:   Studies Reviewed: .         Risk Assessment/Calculations:    CHA2DS2-VASc Score = 4  This indicates a 4.8% annual risk of stroke. The patient's score is based upon: CHF History: 0 HTN History: 1 Diabetes History: 0 Stroke History: 0 Vascular Disease History: 0 Age Score: 2 Gender Score: 1       Physical Exam:   VS:  BP 134/60   Pulse 84   Ht 5\' 6"  (1.676 m)   Wt 163 lb 9.6 oz (74.2 kg)   SpO2 96%   BMI 26.41 kg/m    Wt Readings from Last 3 Encounters:  01/30/23 163 lb 9.6 oz (74.2 kg)  01/28/23 163 lb 4.8 oz (74.1 kg)  01/26/23 163 lb 4.8 oz (74.1 kg)    GEN: Well nourished, well developed in no acute distress NECK: No JVD; No carotid bruits CARDIAC: Irreg. Irreg.  RESPIRATORY:  Clear to auscultation without rales, wheezing or rhonchi  ABDOMEN: Soft, non-tender, non-distended EXTREMITIES:  some degree  of dementia   ASSESSMENT AND PLAN: .     Atrial fib :   still in atrial fib .   She is on Xarelto . She will follow-up with Dr. Odis Hollingshead  in the office as scheduled on January   2.  Anemia:   she was having bleeding from her gums`.  Dr. Odis Hollingshead stopped aspirin last week.  She has continued the Xarelto.  The bleeding has now stopped.  Her hemoglobin has improved from 7.9 up to 9.2.  At this point it is safe for her to continue Xarelto.  She is no longer having any bleeding and her hemoglobin is improving.         Dispo: with dr. Odis Hollingshead on Jan. 2, 2025    Signed, Kristeen Miss, MD

## 2023-01-28 NOTE — Progress Notes (Unsigned)
Location:   SNF [facility] Room Number: 26 Place of Service:  SNF (31) Provider: Arna Snipe Truly Stankiewicz NP  Sigmund Hazel, MD  Patient Care Team: Sigmund Hazel, MD as PCP - General (Family Medicine) Tessa Lerner, DO as PCP - Cardiology (Cardiology) Suzi Roots as Physician Assistant (Dermatology)  Extended Emergency Contact Information Primary Emergency Contact: [name], [place] [address] [place] of [place] Home Phone: [phone] Work Phone: [phone] Mobile Phone: [phone] Relation: Son Secondary Emergency Contact: Busch,[name]  [place] Phone: [phone] Relation: Son  Code Status: DNR Goals of care: Advanced Directive information    01/26/2023    6:54 AM  Advanced Directives  Does Patient Have a Medical Advance Directive? Yes  Does patient want to make changes to medical advance directive? No - Patient declined     Chief Complaint  Patient presents with  . Acute Visit    Anemia    HPI:  Pt is a 82 y.o. female seen today for an acute visit for anemia, Hgb 7.411/19/24<< 8.0 01/20/23<<8.1 01/15/23, added Vit B12(Vit B12 273 12/10/22) and Fe 01/15/23. Gum bleed, followed by dentist.   ED 01/26/23 oral bleed, saw dentist 2/2 gum bleed, plaque issues.   New onset Atrial Fibrillation -2D echo from 10/13/2022 with a EF of 60 to 65%, grade 1 DD and mildly dilated left atrium. -Patient with history of bradycardia arrhythmia for which she was sent to cardiology in May 2024. -Patient felt not a great candidate for long-term anticoagulation but fall risk now is lower due to debility per son. -Patient placed on Xarelto which is preferred medication per family. -Patient has been placed on full dose Lovenox in anticipation of bone marrow biopsy which was done 01/06/2023.   -Resuming Rivaroxaban 01/07/2023.    Saw oncology 01/19/23 MGUS, negative cone marrow biopsy, moderate risk of progression to IgM myeloma or  Waldenstrom macroglobulinemia(19-37% in next 20 years), f/u.              Hydronephrosis: f/u urology for renal mass left 1.9x1.6cm, pending MRI             Hospitalized 12/22/22 for metabolic encephalopathy 2/2 to UTI, AKI, and hypercalcemia.              Dysphagia, f/u ST             Hospitalized 10/12/22-10/17/22 UTI, encephalopathy, acute CVA             GERD, takes Pantoprazole             OA R+L knee pain, f/u Ortho for Gel inj             CVA resolved difficulty word finding, on Statin, ASA, f/u stroke clinic, MRI small punctate infarct in the posteerior limb of R internal capsule, no focal weakness             Gout, stable, takes Allopurinol             HLD, off Atorvastatin, LDL 62 10/13/22             HTN, on Hydralazine,  prn Clonidine             CXR 10/14/22 pulm edema/volume overload, not apparent, prn Furosemide, BNP 950.8 10/14/22, Echo 60-65% EF 10/13/22             Constipation, stable, on prn Senokot S  Prediabetes Hgb A1c 6.0 10/13/22, TSH 1.293 10/12/22             Positive anti CCP test, f/u Rheumatology              Bradycardia, Hx of, off Carvedilol                 Past Medical History:  Diagnosis Date  . CKD (chronic kidney disease), stage II    nephrologist-- dr Kathrene Bongo  [facility])  . Diabetes mellitus type 2, diet-controlled (HCC)    followed by pcp---   (09-20-2019  per pt does not check blood sugar at home)  . Fracture of humeral shaft, right, closed 04/25/2022  . GERD (gastroesophageal reflux disease)   . History of primary hyperparathyroidism    s/p  parathyroidectomy right inferior on 12-02-2012  . Humerus fracture 04/25/2022  . Hyperlipidemia   . Hypertension    followd by nephrologist----  (09-20-2019  per pt had stress test done some time ago , unsure when/ where, thinks told ok)  . OA (osteoarthritis)   . Osteoporosis   . Prosthetic joint infection (HCC)    followed by dr j. hatcher (ID)   left total shoulder arthroplasty 12/ 2016  ; 12/  2020  revision with explant joint replacement and hemiarthroplasty;  completed 6 month antibiotic 05/ 2021  . Vertigo    Past Surgical History:  Procedure Laterality Date  . COLONOSCOPY    . DILATATION & CURETTAGE/HYSTEROSCOPY WITH MYOSURE N/A 09/28/2019   Procedure: DILATATION & CURETTAGE/HYSTEROSCOPY WITH MYOSURE;  Surgeon: Olivia Mackie, MD;  Location: [facility];  Service: Gynecology;  Laterality: N/A;  . EYE SURGERY  yrs ago   laser surgery for retinal tear.  (unilateral , pt unsure which side)  . ORIF HUMERUS FRACTURE Right 04/27/2022   Procedure: OPEN REDUCTION INTERNAL FIXATION (ORIF) PROXIMAL HUMERUS FRACTURE;  Surgeon: Bjorn Pippin, MD;  Location: [facility];  Service: Orthopedics;  Laterality: Right;  . PARATHYROIDECTOMY N/A 12/02/2012   Procedure: PARATHYROIDECTOMY with frozen section ;  Surgeon: Velora Heckler, MD;  Location: [facility];  Service: General;  Laterality: N/A;  . REVERSE SHOULDER ARTHROPLASTY Right 04/27/2022   Procedure: REVERSE SHOULDER ARTHROPLASTY;  Surgeon: Bjorn Pippin, MD;  Location: [facility];  Service: Orthopedics;  Laterality: Right;  . SHOULDER INJECTION Left 07/27/2013   Procedure: SHOULDER INJECTION;  Surgeon: Loreta Ave, MD;  Location: [facility] OR;  Service: Orthopedics;  Laterality: Left;  . STERIOD INJECTION Right 02/14/2015   Procedure: RIGHT SHOULDER CORTISONE INJECTION;  Surgeon: Loreta Ave, MD;  Location: [facility] OR;  Service: Orthopedics;  Laterality: Right;  . TOTAL HIP ARTHROPLASTY Left 07/27/2013   Procedure: TOTAL HIP ARTHROPLASTY ANTERIOR APPROACH;  Surgeon: Loreta Ave, MD;  Location: [facility] OR;  Service: Orthopedics;  Laterality: Left;  . TOTAL SHOULDER ARTHROPLASTY Left 02/14/2015   Procedure: LEFT TOTAL SHOULDER ARTHROPLASTY;  Surgeon: Loreta Ave, MD;  Location: [facility] OR;  Service: Orthopedics;  Laterality: Left;  . TOTAL SHOULDER REVISION Left 02/09/2019   Procedure: TOTAL SHOULDER REVISION;  Surgeon: Bjorn Pippin, MD;  Location: [facility];  Service: Orthopedics;  Laterality: Left;    Allergies  Allergen Reactions  . Abaloparatide Other (See Comments)    "Burred vision, lightheaded" (patient does not recall this in 2024)  . Mobic [Meloxicam] Hypertension  . Crestor [Rosuvastatin] Other (See Comments)    Muscle aches  . Norvasc [Amlodipine Besylate] Swelling and Other (See Comments)    Ankle  swelling  . Simvastatin Other (See Comments)    Muscle aches, high LFTs  . Sulfa Antibiotics Rash  . Zetia [Ezetimibe] Other (See Comments)    Muscle aches    Allergies as of 01/28/2023       Reactions   Abaloparatide Other (See Comments)   "Burred vision, lightheaded" (patient does not recall this in 2024)   Mobic [meloxicam] Hypertension   Crestor [rosuvastatin] Other (See Comments)   Muscle aches   Norvasc [amlodipine Besylate] Swelling, Other (See Comments)   Ankle swelling   Simvastatin Other (See Comments)   Muscle aches, high LFTs   Sulfa Antibiotics Rash   Zetia [ezetimibe] Other (See Comments)   Muscle aches        Medication List        Accurate as of January 28, 2023 11:59 PM. If you have any questions, ask your nurse or doctor.          acetaminophen 325 MG tablet Commonly known as: TYLENOL Take 650 mg by mouth every 4 (four) hours as needed for moderate pain (pain score 4-6).   allopurinol 300 MG tablet Commonly known as: ZYLOPRIM Take 300 mg by mouth daily.   aspirin EC 81 MG tablet Take 1 tablet (81 mg total) by mouth daily. Swallow whole.   carbamide peroxide 6.5 % OTIC solution Commonly known as: DEBROX Place 5 drops into both ears daily.   cloNIDine 0.1 MG tablet Commonly known as: CATAPRES Take 0.1 mg by mouth daily as needed (for hypertension- if systolic b/p is over 160).   feeding supplement Liqd Take 237 mLs by mouth 2 (two) times daily between meals.   ferrous sulfate 325 (65 FE) MG tablet Take 325 mg by mouth See admin instructions. Take 325 mg by mouth by mouth  once daily on Monday, Wednesday, and Friday   folic acid 1 MG tablet Commonly known as: FOLVITE Take 1 mg by mouth daily.   hydrALAZINE 100 MG tablet Commonly known as: APRESOLINE Take 1 tablet (100 mg total) by mouth 3 (three) times daily.   magnesium oxide 400 (240 Mg) MG tablet Commonly known as: MAG-OX Take 1 tablet (400 mg total) by mouth daily.   meclizine 12.5 MG tablet Commonly known as: ANTIVERT Take 1 tablet (12.5 mg total) by mouth 3 (three) times daily as needed for dizziness. What changed: reasons to take this   pantoprazole 40 MG tablet Commonly known as: PROTONIX Take 1 tablet (40 mg total) by mouth 2 (two) times daily before a meal. Take 40 mg by mouth in the morning and at 5 PM- BEFORE FOOD   polyethylene glycol 17 g packet Commonly known as: MIRALAX / GLYCOLAX Take 17 g by mouth 2 (two) times daily.   Rivaroxaban 15 MG Tabs tablet Commonly known as: XARELTO Take 1 tablet (15 mg total) by mouth daily with supper. What changed: when to take this   senna-docusate 8.6-50 MG tablet Commonly known as: Senokot-S Take 1 tablet by mouth 2 (two) times daily.   tamsulosin 0.4 MG Caps capsule Commonly known as: FLOMAX Take 1 capsule (0.4 mg total) by mouth daily.   Zinc Oxide 10 % Oint Apply 1 Application topically See admin instructions. 1 application to buttocks every shift for redness        Review of Systems  Constitutional:  Negative for appetite change, fatigue and fever.  HENT:  Negative for trouble swallowing.        Gum bleed  Eyes:  Negative for visual  disturbance.  Respiratory:  Negative for cough and shortness of breath.   Cardiovascular:  Negative for leg swelling.  Gastrointestinal:  Negative for abdominal pain and constipation.       GERD symptoms intermittently  Genitourinary:  Negative for dysuria and urgency.       Nocturnal urination 2x average.   Musculoskeletal:  Positive for arthralgias and gait problem.       R+L knee pain, R+L  shoulder limited overhead ROM, c/o pain in the R shoulder, mild.   Skin:  Negative for color change.  Neurological:  Negative for speech difficulty, weakness and headaches.  Psychiatric/Behavioral:  Positive for sleep disturbance. Negative for confusion. The patient is not nervous/anxious.        Sometimes not sleeping, declined sleeping aid.     Immunization History  Administered Date(s) Administered  . Influenza Split 11/24/2008  . Influenza, High Dose Seasonal PF 03/16/2014, 01/14/2016, 01/21/2017, 02/24/2022  . Influenza,inj,Quad PF,6+ Mos 11/20/2010, 02/01/2013  . Influenza-Unspecified 11/20/2010, 02/01/2013, 02/19/2018, 02/24/2022  . Moderna Sars-Covid-2 Vaccination 04/23/2019, 05/21/2019, 02/20/2020  . Pneumococcal Conjugate-13 03/16/2014  . Pneumococcal Polysaccharide-23 04/28/2005  . Td 04/28/2005  . Td (Adult) 04/28/2005  . Tdap 06/08/2017  . Zoster Recombinant(Shingrix) 01/14/2019  . Zoster, Live 07/21/2007   Pertinent  Health Maintenance Due  Topic Date Due  . OPHTHALMOLOGY EXAM  Never done  . FOOT EXAM  10/01/2022  . INFLUENZA VACCINE  10/09/2022  . HEMOGLOBIN A1C  04/15/2023  . DEXA SCAN  Completed      04/12/2019    2:21 PM 07/19/2019    2:20 PM 09/28/2019    8:51 AM 06/21/2020    5:59 PM 09/23/2021    1:10 PM  Fall Risk  Falls in the past year? 0 0     (RETIRED) Patient Fall Risk Level Low fall risk Low fall risk Moderate fall risk Low fall risk Low fall risk  Fall risk Follow up Falls evaluation completed Falls evaluation completed      Functional Status Survey:    Vitals:   01/28/23 1117  BP: 136/71  Pulse: 84  Resp: 20  Temp: 98 F (36.7 C)  SpO2: 98%  Weight: 163 lb 4.8 oz (74.1 kg)   Body mass index is 27.17 kg/m. Physical Exam Vitals and nursing note reviewed.  Constitutional:      Appearance: Normal appearance.  HENT:     Head: Normocephalic and atraumatic.     Nose: Nose normal.     Mouth/Throat:     Mouth: Mucous membranes are moist.   Eyes:     Extraocular Movements: Extraocular movements intact.     Conjunctiva/sclera: Conjunctivae normal.     Pupils: Pupils are equal, round, and reactive to light.  Cardiovascular:     Rate and Rhythm: Normal rate and regular rhythm.     Heart sounds: No murmur heard. Pulmonary:     Effort: Pulmonary effort is normal.     Breath sounds: Rales present. No wheezing or rhonchi.     Comments: Bibasilar rales.  Abdominal:     Palpations: Abdomen is soft.     Tenderness: There is no abdominal tenderness.  Musculoskeletal:        General: Tenderness present. No swelling or deformity.     Cervical back: Normal range of motion and neck supple.     Right lower leg: No edema.     Left lower leg: Edema present.     Comments: R+L shoulder limited overhead ROM, mild R shoulder pain.  S/p R+L shoulder replacement. Left ankle trace edema.   Skin:    General: Skin is warm and dry.     Findings: Rash present.  Neurological:     General: No focal deficit present.     Mental Status: She is alert and oriented to person, place, and time. Mental status is at baseline.     Gait: Gait abnormal.  Psychiatric:        Mood and Affect: Mood normal.        Behavior: Behavior normal.        Thought Content: Thought content normal.        Judgment: Judgment normal.    Labs reviewed: Recent Labs    01/06/23 0926 01/07/23 0425 01/08/23 0413 01/16/23 0000 01/19/23 0000 01/26/23 0718  NA 134* 134* 135 135* 137 135  K 3.6 3.6 3.1* 3.4* 3.4* 4.1  CL 106 106 106 105 105 109  CO2 22 19* 22  --  23* 21*  GLUCOSE 115* 89 95  --   --  98  BUN 14 13 13 10 8 9   CREATININE 0.62 0.65 0.61 0.6 0.5 0.95  CALCIUM 9.9 9.2 9.1 8.3* 8.4* 8.8*  MG 1.9 2.2 2.4  --   --   --   PHOS 2.0* 2.6 2.2*  --   --   --    Recent Labs    10/13/22 0738 12/24/22 0347 12/28/22 1256 01/01/23 0416 01/08/23 0413 01/16/23 0000 01/19/23 0000  AST 14*  --  17  --  17 11* 10*  ALT 10  --  14  --  11 7 3*  ALKPHOS 56  --   85  --  64 72 79  BILITOT 1.2  --  0.8  --  0.6  --   --   PROT 5.1*  --  6.5  --  5.6*  --   --   ALBUMIN 2.6*   < > 3.0*   < > 2.7* 2.5* 2.6*   < > = values in this interval not displayed.   Recent Labs    01/07/23 0425 01/08/23 0413 01/16/23 0000 01/19/23 0000 01/26/23 0718  WBC 6.4 8.8 5.5 6.7 6.0  NEUTROABS  --  6.5 3,641.00 4,730.00 3.9  HGB 8.8* 9.2* 8.1* 8.0* 7.9*  HCT 29.3* 29.3* 25* 24* 26.1*  MCV 99.3 96.7  --   --  94.6  PLT 231 254 315 324 263   Lab Results  Component Value Date   TSH 1.499 12/28/2022   Lab Results  Component Value Date   HGBA1C 6.0 (H) 10/13/2022   Lab Results  Component Value Date   CHOL 121 10/13/2022   HDL 44 10/13/2022   LDLCALC 62 10/13/2022   TRIG 74 10/13/2022   CHOLHDL 2.8 10/13/2022    Significant Diagnostic Results in last 30 days:  CT BONE MARROW BIOPSY & ASPIRATION  Result Date: 01/06/2023 INDICATION: 82 year old female with history of monoclonal gammopathy of unknown significance. EXAM: CT-GUIDED BONE MARROW BIOPSY AND ASPIRATION MEDICATIONS: None ANESTHESIA/SEDATION: Fentanyl 50 mcg IV; Versed 1 mg IV Sedation Time: 10 minutes; The patient was continuously monitored during the procedure by the interventional radiology nurse under my direct supervision. COMPLICATIONS: None immediate. PROCEDURE: Informed consent was obtained from the patient following an explanation of the procedure, risks, benefits and alternatives. The patient understands, agrees and consents for the procedure. All questions were addressed. A time out was performed prior to the initiation of the procedure. The patient was positioned prone  and non-contrast localization CT was performed of the pelvis to demonstrate the iliac marrow spaces. The operative site was prepped and draped in the usual sterile fashion. Under sterile conditions and local anesthesia, a 22 gauge spinal needle was utilized for procedural planning. Next, an 11 gauge coaxial bone biopsy needle was  advanced into the right iliac marrow space. Needle position was confirmed with CT imaging. Initially, a bone marrow aspiration was performed. Next, a bone marrow biopsy was obtained with the 11 gauge outer bone marrow device. Samples were prepared with the cytotechnologist and deemed adequate. The needle was removed and superficial hemostasis was obtained with manual compression. A dressing was applied. The patient tolerated the procedure well without immediate post procedural complication. IMPRESSION: Successful CT guided right iliac bone marrow aspiration and core biopsy. Marliss Coots, MD [facility]iology Electronically Signed   By: Marliss Coots M.D.   On: 01/06/2023 09:22   CT CHEST ABDOMEN PELVIS W CONTRAST  Result Date: 01/03/2023 CLINICAL DATA:  Hypercalcemia, weight loss, rule out malignancy * Tracking Code: BO * EXAM: CT CHEST, ABDOMEN, AND PELVIS WITH CONTRAST TECHNIQUE: Multidetector CT imaging of the chest, abdomen and pelvis was performed following the standard protocol during bolus administration of intravenous contrast. RADIATION DOSE REDUCTION: This exam was performed according to the departmental dose-optimization program which includes automated exposure control, adjustment of the mA and/or kV according to patient size and/or use of iterative reconstruction technique. CONTRAST:  OMNIPAQUE IOHEXOL 300 MG/ML  SOLN COMPARISON:  CT abdomen pelvis, 06/21/2019 FINDINGS: CT CHEST FINDINGS Cardiovascular: Aortic atherosclerosis. Cardiomegaly. Three-vessel coronary artery calcifications. No pericardial effusion. Mediastinum/Nodes: No enlarged mediastinal, hilar, or axillary lymph nodes. Thyroid gland, trachea, and esophagus demonstrate no significant findings. Lungs/Pleura: Bibasilar scarring or atelectasis. No pleural effusion or pneumothorax. Musculoskeletal: No chest wall abnormality. No acute osseous findings. Status post bilateral  shoulder arthroplasty. CT ABDOMEN PELVIS FINDINGS Hepatobiliary: No solid liver abnormality is seen. Layering sludge in the gallbladder. No discrete gallstones. No gallbladder wall thickening, or biliary dilatation. Pancreas: Unremarkable. No pancreatic ductal dilatation or surrounding inflammatory changes. Spleen: Normal in size without significant abnormality. Adrenals/Urinary Tract: Benign non nodular adenomatous thickening of the adrenal glands. Mild bilateral hydronephrosis and hydroureter to the ureterovesicular junctions, without calculus or other obstruction visualized. Distended urinary bladder, measuring up to 15.1 cm. Numerous bilateral renal lesions. The majority of these are simple appearing, fluid attenuation cysts, however there is a soft tissue attenuation lesion arising from the superior pole of the left kidney measuring 1.9 x 1.6 cm (series 3, image 69). Additional complex appearing cyst of the inferior pole of the right kidney with both fluid and soft tissue attenuation components, measuring 3.8 x 3.3 cm (series 3, image 73) and of the lateral midportion of the left kidney measuring 1.9 x 1.3 cm (series 3, image 67). Stomach/Bowel: Stomach is within normal limits. Appendix appears normal. No evidence of bowel wall thickening, distention, or inflammatory changes. Descending and sigmoid diverticulosis. Vascular/Lymphatic: Severe aortic atherosclerosis. No enlarged abdominal or pelvic lymph nodes. Reproductive: No mass or other abnormality. Other: No abdominal wall hernia or abnormality. No ascites. Musculoskeletal: No acute osseous findings. Status post left hip total arthroplasty. IMPRESSION: 1. No definite evidence of malignancy or metastatic disease in the chest, abdomen, or pelvis. 2. Soft tissue attenuation lesion arising from the superior pole of the left kidney measuring 1.9 x 1.6 cm concerning for solid renal mass. Additional complex appearing cysts of the inferior pole of the right kidney  and lateral midportion of the left kidney. Recommend multiphasic contrast enhanced CT or MRI for further characterization, preferably on a nonemergent, outpatient basis, when appropriate. 3. Mild bilateral hydronephrosis and hydroureter to the ureterovesicular junctions, without calculus or other obstruction visualized. Distended urinary bladder, measuring up to 15.1 cm. Findings are consistent with urinary retention and hydronephrosis secondary to back pressure. 4. Cardiomegaly and coronary artery disease. Aortic Atherosclerosis (ICD10-I70.0). Electronically Signed   By: Jearld Lesch M.D.   On: 01/03/2023 15:07    Assessment/Plan: IDA (iron deficiency anemia) Hgb 7.411/19/24<< 8.0 01/20/23<<8.1 01/15/23, added Vit B12(Vit B12 273 12/10/22) and Fe. Gum bleed, followed by dentist.  Will hold Xarelto, ASA if HPOA and Cardiology agree Repeat CBC in am Cardiology:  see her 01/30/23, hold ASA, continue Xarelto, adding BMP in am  Gastroesophageal reflux disease Stable, continue Pantoprazole.   Atrial fibrillation (HCC) Heart rate is in control, hold Xarelto if HPOA/cardiology agree  MGUS (monoclonal gammopathy of unknown significance) Saw oncology 01/19/23 MGUS, negative cone marrow biopsy, moderate risk of progression to IgM myeloma or Waldenstrom macroglobulinemia(19-37% in next 20 years), f/u.   History of stroke resolved difficulty word finding, hold ASA 2/2 gum bleed/Hgb 7.4 01/27/23, f/u stroke clinic, MRI small punctate infarct in the posteerior limb of R internal capsule, no focal weakness  Essential hypertension Blood pressure is managed  on Hydralazine,  prn Clonidine    Family/ staff Communication: plan of care reviewed with the patient and charge nurse.   Labs/tests ordered: BMP,  CBC in am  Time spend 30 minutes.

## 2023-01-29 ENCOUNTER — Encounter: Payer: Self-pay | Admitting: Nurse Practitioner

## 2023-01-29 LAB — CBC AND DIFFERENTIAL
HCT: 29 — AB (ref 36–46)
Hemoglobin: 9.2 — AB (ref 12.0–16.0)
Platelets: 346 10*3/uL (ref 150–400)
WBC: 7.4

## 2023-01-29 LAB — CBC: RBC: 3.21 — AB (ref 3.87–5.11)

## 2023-01-29 LAB — BASIC METABOLIC PANEL
BUN: 8 (ref 4–21)
CO2: 24 — AB (ref 13–22)
Chloride: 105 (ref 99–108)
Creatinine: 0.8 (ref 0.5–1.1)
Glucose: 134
Potassium: 3.8 meq/L (ref 3.5–5.1)
Sodium: 136 — AB (ref 137–147)

## 2023-01-29 LAB — COMPREHENSIVE METABOLIC PANEL: Calcium: 9.6 (ref 8.7–10.7)

## 2023-01-30 ENCOUNTER — Encounter: Payer: Self-pay | Admitting: Cardiovascular Disease

## 2023-01-30 ENCOUNTER — Ambulatory Visit: Payer: Medicare PPO | Attending: Cardiovascular Disease | Admitting: Cardiovascular Disease

## 2023-01-30 VITALS — BP 134/60 | HR 84 | Ht 66.0 in | Wt 163.6 lb

## 2023-01-30 DIAGNOSIS — I4891 Unspecified atrial fibrillation: Secondary | ICD-10-CM | POA: Diagnosis not present

## 2023-01-30 LAB — PROTIME-INR: Protime: 12.5 (ref 10.0–13.8)

## 2023-01-30 LAB — POCT INR: INR: 1.2 (ref 0.80–1.20)

## 2023-01-30 NOTE — Patient Instructions (Signed)
Follow-Up: At [facility], you and your health needs are our priority.  As part of our continuing mission to provide you with exceptional heart care, we have created designated Provider Care Teams.  These Care Teams include your primary Cardiologist (physician) and Advanced Practice Providers (APPs -  Physician Assistants and Nurse Practitioners) who all work together to provide you with the care you need, when you need it.  We recommend signing up for the patient portal called "MyChart".  Sign up information is provided on this After Visit Summary.  MyChart is used to connect with patients for Virtual Visits (Telemedicine).  Patients are able to view lab/test results, encounter notes, upcoming appointments, etc.  Non-urgent messages can be sent to your provider as well.   To learn more about what you can do with MyChart, go to [email or web address].    Your next appointment:   As Scheduled  Provider:   S. Odis Hollingshead, MD

## 2023-02-04 ENCOUNTER — Ambulatory Visit: Payer: Medicare PPO | Admitting: Cardiology

## 2023-02-13 ENCOUNTER — Encounter: Payer: Self-pay | Admitting: Podiatry

## 2023-02-13 ENCOUNTER — Ambulatory Visit: Payer: Medicare PPO | Admitting: Podiatry

## 2023-02-13 DIAGNOSIS — E119 Type 2 diabetes mellitus without complications: Secondary | ICD-10-CM | POA: Diagnosis not present

## 2023-02-13 DIAGNOSIS — B351 Tinea unguium: Secondary | ICD-10-CM

## 2023-02-13 DIAGNOSIS — M79676 Pain in unspecified toe(s): Secondary | ICD-10-CM

## 2023-02-13 NOTE — Progress Notes (Signed)
This patient returns to my office for at risk foot care.  This patient requires this care by a professional since this patient will be at risk due to having diabetes type 2.  This patient is unable to cut nails herself since the patient cannot reach her nails.These nails are painful walking and wearing shoes.  This patient presents for at risk foot care today.  General Appearance  Alert, conversant and in no acute stress.  Vascular  Dorsalis pedis and posterior tibial  pulses are  weakly palpable  bilaterally.  Capillary return is within normal limits  bilaterally. Temperature is within normal limits  bilaterally.  Neurologic  Senn-Weinstein monofilament wire test within normal limits  bilaterally. Muscle power within normal limits bilaterally.  Nails Thick disfigured discolored nails with subungual debris  from hallux to fifth toes bilaterally. No evidence of bacterial infection or drainage bilaterally.  Orthopedic  No limitations of motion  feet .  No crepitus or effusions noted.  No bony pathology or digital deformities noted.  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  Skin  normotropic skin with no porokeratosis noted bilaterally.  No signs of infections or ulcers noted.     Onychomycosis  Pain in right toes  Pain in left toes  Chronic arthrits  Midfoot  B/L.  Consent was obtained for treatment procedures.   Mechanical debridement of nails 1-5  bilaterally performed with a nail nipper.  Filed with dremel without incident.   Return office visit     3 months               Told patient to return for periodic foot care and evaluation due to potential at risk complications.   Helane Gunther DPM

## 2023-02-17 DIAGNOSIS — H903 Sensorineural hearing loss, bilateral: Secondary | ICD-10-CM | POA: Diagnosis not present

## 2023-02-19 ENCOUNTER — Encounter: Payer: Self-pay | Admitting: Nurse Practitioner

## 2023-02-19 ENCOUNTER — Non-Acute Institutional Stay (SKILLED_NURSING_FACILITY): Payer: Medicare PPO | Admitting: Nurse Practitioner

## 2023-02-19 DIAGNOSIS — Z66 Do not resuscitate: Secondary | ICD-10-CM

## 2023-02-19 DIAGNOSIS — R7303 Prediabetes: Secondary | ICD-10-CM | POA: Diagnosis not present

## 2023-02-19 DIAGNOSIS — I4891 Unspecified atrial fibrillation: Secondary | ICD-10-CM

## 2023-02-19 DIAGNOSIS — I10 Essential (primary) hypertension: Secondary | ICD-10-CM | POA: Diagnosis not present

## 2023-02-19 DIAGNOSIS — Z87898 Personal history of other specified conditions: Secondary | ICD-10-CM | POA: Diagnosis not present

## 2023-02-19 DIAGNOSIS — M109 Gout, unspecified: Secondary | ICD-10-CM

## 2023-02-19 DIAGNOSIS — K219 Gastro-esophageal reflux disease without esophagitis: Secondary | ICD-10-CM

## 2023-02-19 NOTE — Assessment & Plan Note (Signed)
stable, takes Allopurinol.

## 2023-02-19 NOTE — Progress Notes (Signed)
Location:  [facility] Room Number: 061-A Place of Service:  SNF [phone]) Provider:  Alverda Skeans, MD  Patient Care Team: Sigmund Hazel, MD as PCP - General (Family Medicine) Tessa Lerner, DO as PCP - Cardiology (Cardiology) Suzi Roots as Physician Assistant (Dermatology)  Extended Emergency Contact Information Primary Emergency Contact: [place], [place] [address] D[place] of [place] Home Phone: [phone] Work Phone: [phone] Mobile Phone: [phone] Relation: Son Secondary Emergency Contact: Vanbuskirk,[name]  [place] Phone: [phone] Relation: Son  Code Status:  DNR Goals of care: Advanced Directive information    02/19/2023    9:58 AM  Advanced Directives  Does Patient Have a Medical Advance Directive? Yes  Type of Advance Directive Out of facility DNR (pink MOST or yellow form);Healthcare Power of [place];Living will  Does patient want to make changes to medical advance directive? No - Patient declined  Copy of Healthcare Power of Attorney in Chart? No - copy requested  Would patient like information on creating a medical advance directive? No - Patient declined  Pre-existing out of facility DNR order (yellow form or pink MOST form) Yellow form placed in chart (order not valid for inpatient use)     Chief Complaint  Patient presents with   Medical Management of Chronic Issues    Routine visit and discuss eye exam ,medicare annual wellness visit, foot exam,diabetic kidney evaluation,shingles,flu,and covid vaccines.    HPI:  Pt is a 82 y.o. female seen today for medical management of chronic diseases.       Anemia, Hgb 9.2 01/29/23, added Vit B12(Vit B12 273 12/10/22) and Fe 01/15/23.              New onset Atrial Fibrillation -2D echo from 10/13/2022 with a EF of 60 to 65%, grade 1 DD and mildly dilated left atrium. -Patient with history of bradycardia arrhythmia  for which she was sent to cardiology in May 2024. -Patient felt not a great candidate for long-term anticoagulation but fall risk now is lower due to debility per son. -Patient placed on Xarelto which is preferred medication per family. -Patient has been placed on full dose Lovenox in anticipation of bone marrow biopsy which was done 01/06/2023.   -Resuming Rivaroxaban 01/07/2023.               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              Hydronephrosis: f/u urology for renal mass left 1.9x1.6cm, pending MRI             Hospitalized 12/22/22 for metabolic encephalopathy 2/2 to UTI, AKI, and hypercalcemia.              Dysphagia, f/u ST             Hospitalized 10/12/22-10/17/22 UTI, encephalopathy, acute CVA             GERD, takes Pantoprazole             OA R+L knee pain, f/u Ortho for Gel inj             CVA resolved difficulty word finding, off Statin, ASA, f/u stroke clinic, MRI small punctate infarct in the posteerior limb of R internal capsule, no focal weakness  Gout, stable, takes Allopurinol             HLD, off Atorvastatin, LDL 62 10/13/22             HTN, on Hydralazine,  prn Clonidine, Bun/creat 8/0.81 01/29/23             CXR 10/14/22 pulm edema/volume overload, not apparent, prn Furosemide, BNP 950.8 10/14/22, Echo 60-65% EF 10/13/22             Constipation, stable, diet controlled             Prediabetes Hgb A1c 6.0 10/13/22, TSH 1.293 10/12/22             Positive anti CCP test, f/u Rheumatology              Bradycardia, Hx of, off Carvedilol   Hx of urinary retention,  taking Tamsulosin.                 Past Medical History:  Diagnosis Date   CKD (chronic kidney disease), stage II    nephrologist-- dr Kathrene Bongo  [facility])   Diabetes mellitus type 2, diet-controlled (HCC)    followed by pcp---   (09-20-2019  per pt does not check  blood sugar at home)   Fracture of humeral shaft, right, closed 04/25/2022   GERD (gastroesophageal reflux disease)    History of primary hyperparathyroidism    s/p  parathyroidectomy right inferior on 12-02-2012   Humerus fracture 04/25/2022   Hyperlipidemia    Hypertension    followd by nephrologist----  (09-20-2019  per pt had stress test done some time ago , unsure when/ where, thinks told ok)   OA (osteoarthritis)    Osteoporosis    Prosthetic joint infection (HCC)    followed by dr j. hatcher (ID)   left total shoulder arthroplasty 12/ 2016  ; 12/ 2020  revision with explant joint replacement and hemiarthroplasty;  completed 6 month antibiotic 05/ 2021   Vertigo    Past Surgical History:  Procedure Laterality Date   COLONOSCOPY     DILATATION & CURETTAGE/HYSTEROSCOPY WITH MYOSURE N/A 09/28/2019   Procedure: DILATATION & CURETTAGE/HYSTEROSCOPY WITH MYOSURE;  Surgeon: Olivia Mackie, MD;  Location: [facility];  Service: Gynecology;  Laterality: N/A;   EYE SURGERY  yrs ago   laser surgery for retinal tear.  (unilateral , pt unsure which side)   ORIF HUMERUS FRACTURE Right 04/27/2022   Procedure: OPEN REDUCTION INTERNAL FIXATION (ORIF) PROXIMAL HUMERUS FRACTURE;  Surgeon: Bjorn Pippin, MD;  Location: [facility];  Service: Orthopedics;  Laterality: Right;   PARATHYROIDECTOMY N/A 12/02/2012   Procedure: PARATHYROIDECTOMY with frozen section ;  Surgeon: Velora Heckler, MD;  Location: [facility];  Service: General;  Laterality: N/A;   REVERSE SHOULDER ARTHROPLASTY Right 04/27/2022   Procedure: REVERSE SHOULDER ARTHROPLASTY;  Surgeon: Bjorn Pippin, MD;  Location: [facility];  Service: Orthopedics;  Laterality: Right;   SHOULDER INJECTION Left 07/27/2013   Procedure: SHOULDER INJECTION;  Surgeon: Loreta Ave, MD;  Location: [facility] OR;  Service: Orthopedics;  Laterality: Left;   STERIOD INJECTION Right 02/14/2015   Procedure: RIGHT SHOULDER CORTISONE INJECTION;  Surgeon: Loreta Ave,  MD;  Location: [facility] OR;  Service: Orthopedics;  Laterality: Right;   TOTAL HIP ARTHROPLASTY Left 07/27/2013   Procedure: TOTAL HIP ARTHROPLASTY ANTERIOR APPROACH;  Surgeon: Loreta Ave, MD;  Location: [facility] OR;  Service: Orthopedics;  Laterality: Left;   TOTAL SHOULDER ARTHROPLASTY Left 02/14/2015  Procedure: LEFT TOTAL SHOULDER ARTHROPLASTY;  Surgeon: Loreta Ave, MD;  Location: [facility] OR;  Service: Orthopedics;  Laterality: Left;   TOTAL SHOULDER REVISION Left 02/09/2019   Procedure: TOTAL SHOULDER REVISION;  Surgeon: Bjorn Pippin, MD;  Location: [facility];  Service: Orthopedics;  Laterality: Left;    Allergies  Allergen Reactions   Abaloparatide Other (See Comments)    "Burred vision, lightheaded" (patient does not recall this in 2024)   Mobic [Meloxicam] Hypertension   Crestor [Rosuvastatin] Other (See Comments)    Muscle aches   Norvasc [Amlodipine Besylate] Swelling and Other (See Comments)    Ankle swelling   Simvastatin Other (See Comments)    Muscle aches, high LFTs   Sulfa Antibiotics Rash   Zetia [Ezetimibe] Other (See Comments)    Muscle aches    Outpatient Encounter Medications as of 02/19/2023  Medication Sig   acetaminophen (TYLENOL) 325 MG tablet Take 650 mg by mouth every 4 (four) hours as needed for moderate pain (pain score 4-6).   allopurinol (ZYLOPRIM) 300 MG tablet Take 300 mg by mouth daily.   cloNIDine (CATAPRES) 0.1 MG tablet Take 0.1 mg by mouth daily as needed (for hypertension- if systolic b/p is over 160).   feeding supplement (ENSURE ENLIVE / ENSURE PLUS) LIQD Take 237 mLs by mouth 2 (two) times daily between meals.   ferrous sulfate 325 (65 FE) MG tablet Take 325 mg by mouth See admin instructions. Take 325 mg by mouth by mouth once daily on Monday, Wednesday, and Friday   hydrALAZINE (APRESOLINE) 100 MG tablet Take 1 tablet (100 mg total) by mouth 3 (three) times daily.   magnesium oxide (MAG-OX) 400 (240 Mg) MG tablet Take 1 tablet (400 mg total) by mouth  daily.   pantoprazole (PROTONIX) 40 MG tablet Take 1 tablet (40 mg total) by mouth 2 (two) times daily before a meal. Take 40 mg by mouth in the morning and at 5 PM- BEFORE FOOD   Rivaroxaban (XARELTO) 15 MG TABS tablet Take 1 tablet (15 mg total) by mouth daily with supper. (Patient taking differently: Take 15 mg by mouth every evening.)   tamsulosin (FLOMAX) 0.4 MG CAPS capsule Take 1 capsule (0.4 mg total) by mouth daily.   Zinc Oxide 10 % OINT Apply 1 Application topically See admin instructions. 1 application to buttocks every shift for redness   aspirin EC 81 MG tablet Take 1 tablet (81 mg total) by mouth daily. Swallow whole. (Patient not taking: Reported on 02/19/2023)   carbamide peroxide (DEBROX) 6.5 % OTIC solution Place 5 drops into both ears daily. (Patient not taking: Reported on 02/19/2023)   folic acid (FOLVITE) 1 MG tablet Take 1 mg by mouth daily. (Patient not taking: Reported on 02/19/2023)   meclizine (ANTIVERT) 12.5 MG tablet Take 1 tablet (12.5 mg total) by mouth 3 (three) times daily as needed for dizziness. (Patient not taking: Reported on 02/19/2023)   polyethylene glycol (MIRALAX / GLYCOLAX) 17 g packet Take 17 g by mouth 2 (two) times daily. (Patient not taking: Reported on 02/19/2023)   senna-docusate (SENOKOT-S) 8.6-50 MG tablet Take 1 tablet by mouth 2 (two) times daily. (Patient not taking: Reported on 02/19/2023)   No facility-administered encounter medications on file as of 02/19/2023.    Review of Systems  Constitutional:  Negative for appetite change, fatigue and fever.  HENT:  Negative for congestion, mouth sores and trouble swallowing.   Eyes:  Negative for visual disturbance.  Respiratory:  Negative for cough and  shortness of breath.   Cardiovascular:  Negative for leg swelling.  Gastrointestinal:  Negative for abdominal pain and constipation.       GERD symptoms intermittently  Genitourinary:  Negative for dysuria and urgency.       Nocturnal urination  2x average.   Musculoskeletal:  Positive for arthralgias and gait problem.       R+L knee pain, R+L shoulder limited overhead ROM, c/o pain in the R shoulder, mild.   Skin:  Negative for color change.  Neurological:  Negative for speech difficulty, weakness and headaches.  Psychiatric/Behavioral:  Positive for sleep disturbance. Negative for confusion. The patient is not nervous/anxious.        Sometimes not sleeping, declined sleeping aid.     Immunization History  Administered Date(s) Administered   Influenza Split 11/24/2008   Influenza, High Dose Seasonal PF 03/16/2014, 01/14/2016, 01/21/2017, 02/24/2022   Influenza,inj,Quad PF,6+ Mos 11/20/2010, 02/01/2013   Influenza-Unspecified 11/20/2010, 02/01/2013, 02/19/2018, 02/24/2022   Moderna Sars-Covid-2 Vaccination 04/23/2019, 05/21/2019, 02/20/2020   Pneumococcal Conjugate-13 03/16/2014   Pneumococcal Polysaccharide-23 04/28/2005   Td 04/28/2005   Td (Adult) 04/28/2005   Tdap 06/08/2017   Zoster Recombinant(Shingrix) 01/14/2019   Zoster, Live 07/21/2007   Pertinent  Health Maintenance Due  Topic Date Due   OPHTHALMOLOGY EXAM  Never done   FOOT EXAM  10/01/2022   INFLUENZA VACCINE  10/09/2022   HEMOGLOBIN A1C  04/15/2023   DEXA SCAN  Completed      04/12/2019    2:21 PM 07/19/2019    2:20 PM 09/28/2019    8:51 AM 06/21/2020    5:59 PM 09/23/2021    1:10 PM  Fall Risk  Falls in the past year? 0 0     (RETIRED) Patient Fall Risk Level Low fall risk Low fall risk Moderate fall risk Low fall risk Low fall risk  Fall risk Follow up Falls evaluation completed Falls evaluation completed      Functional Status Survey:    Vitals:   02/19/23 0956 02/20/23 1036  BP: (!) 128/56 (!) 149/83  Pulse: 66   Resp: 18   Temp: (!) 97.2 F (36.2 C)   SpO2: 97%   Weight: 129 lb 6.4 oz (58.7 kg)   Height: 5\' 6"  (1.676 m)    Body mass index is 20.89 kg/m. Physical Exam Vitals and nursing note reviewed.  Constitutional:       Appearance: Normal appearance.  HENT:     Head: Normocephalic and atraumatic.     Nose: Nose normal.     Mouth/Throat:     Mouth: Mucous membranes are moist.  Eyes:     Extraocular Movements: Extraocular movements intact.     Conjunctiva/sclera: Conjunctivae normal.     Pupils: Pupils are equal, round, and reactive to light.  Cardiovascular:     Rate and Rhythm: Normal rate and regular rhythm.     Heart sounds: No murmur heard. Pulmonary:     Effort: Pulmonary effort is normal.     Breath sounds: Rales present. No wheezing or rhonchi.     Comments: Bibasilar rales.  Abdominal:     Palpations: Abdomen is soft.     Tenderness: There is no abdominal tenderness.  Musculoskeletal:        General: Tenderness present. No swelling or deformity.     Cervical back: Normal range of motion and neck supple.     Right lower leg: No edema.     Left lower leg: Edema present.  Comments: R+L shoulder limited overhead ROM, mild R shoulder pain. S/p R+L shoulder replacement. Left ankle trace edema.   Skin:    General: Skin is warm and dry.  Neurological:     General: No focal deficit present.     Mental Status: She is alert and oriented to person, place, and time. Mental status is at baseline.     Gait: Gait abnormal.  Psychiatric:        Mood and Affect: Mood normal.        Behavior: Behavior normal.        Thought Content: Thought content normal.        Judgment: Judgment normal.     Labs reviewed: Recent Labs    01/06/23 0926 01/07/23 0425 01/08/23 0413 01/16/23 0000 01/19/23 0000 01/26/23 0718  NA 134* 134* 135 135* 137 135  K 3.6 3.6 3.1* 3.4* 3.4* 4.1  CL 106 106 106 105 105 109  CO2 22 19* 22  --  23* 21*  GLUCOSE 115* 89 95  --   --  98  BUN 14 13 13 10 8 9   CREATININE 0.62 0.65 0.61 0.6 0.5 0.95  CALCIUM 9.9 9.2 9.1 8.3* 8.4* 8.8*  MG 1.9 2.2 2.4  --   --   --   PHOS 2.0* 2.6 2.2*  --   --   --    Recent Labs    10/13/22 0738 12/24/22 0347 12/28/22 1256  01/01/23 0416 01/08/23 0413 01/16/23 0000 01/19/23 0000  AST 14*  --  17  --  17 11* 10*  ALT 10  --  14  --  11 7 3*  ALKPHOS 56  --  85  --  64 72 79  BILITOT 1.2  --  0.8  --  0.6  --   --   PROT 5.1*  --  6.5  --  5.6*  --   --   ALBUMIN 2.6*   < > 3.0*   < > 2.7* 2.5* 2.6*   < > = values in this interval not displayed.   Recent Labs    01/07/23 0425 01/08/23 0413 01/16/23 0000 01/19/23 0000 01/26/23 0718  WBC 6.4 8.8 5.5 6.7 6.0  NEUTROABS  --  6.5 3,641.00 4,730.00 3.9  HGB 8.8* 9.2* 8.1* 8.0* 7.9*  HCT 29.3* 29.3* 25* 24* 26.1*  MCV 99.3 96.7  --   --  94.6  PLT 231 254 315 324 263   Lab Results  Component Value Date   TSH 1.499 12/28/2022   Lab Results  Component Value Date   HGBA1C 6.0 (H) 10/13/2022   Lab Results  Component Value Date   CHOL 121 10/13/2022   HDL 44 10/13/2022   LDLCALC 62 10/13/2022   TRIG 74 10/13/2022   CHOLHDL 2.8 10/13/2022    Significant Diagnostic Results in last 30 days:  No results found.  Assessment/Plan Prediabetes Diet controlled, Hgb A1c 6.0 10/13/22, TSH 1.293 10/12/22  History of urinary retention taking Tamsulosin, stable  Essential hypertension  on Hydralazine,  prn Clonidine, Bun/creat 8/0.81 01/29/23, blood pressure is fluctuating.   Gout stable, takes Allopurinol  Gastroesophageal reflux disease  takes Pantoprazole  Atrial fibrillation (HCC) Heart rate is in control, on Xarelto  IDA (iron deficiency anemia) Hgb 9.2 01/29/23, added Vit B12(Vit B12 273 12/10/22) and Fe 01/15/23.      Family/ staff Communication: plan of care reviewed with the patient and charge nurse.   Labs/tests ordered:  none  Time spend  30 minutes.

## 2023-02-19 NOTE — Assessment & Plan Note (Signed)
Heart rate is in control, on Xarelto

## 2023-02-19 NOTE — Assessment & Plan Note (Signed)
taking Tamsulosin, stable

## 2023-02-19 NOTE — Assessment & Plan Note (Signed)
takes Pantoprazole 

## 2023-02-19 NOTE — Assessment & Plan Note (Signed)
Diet controlled, Hgb A1c 6.0 10/13/22, TSH 1.293 10/12/22

## 2023-02-19 NOTE — Assessment & Plan Note (Addendum)
on Hydralazine,  prn Clonidine, Bun/creat 8/0.81 01/29/23, blood pressure is fluctuating.

## 2023-02-19 NOTE — Assessment & Plan Note (Signed)
Hgb 9.2 01/29/23, added Vit B12(Vit B12 273 12/10/22) and Fe 01/15/23.

## 2023-02-20 ENCOUNTER — Encounter: Payer: Self-pay | Admitting: Nurse Practitioner

## 2023-02-20 ENCOUNTER — Non-Acute Institutional Stay (INDEPENDENT_AMBULATORY_CARE_PROVIDER_SITE_OTHER): Payer: Self-pay | Admitting: Nurse Practitioner

## 2023-02-20 DIAGNOSIS — Z0000 Encounter for general adult medical examination without abnormal findings: Secondary | ICD-10-CM | POA: Diagnosis not present

## 2023-02-20 DIAGNOSIS — M810 Age-related osteoporosis without current pathological fracture: Secondary | ICD-10-CM | POA: Diagnosis not present

## 2023-02-20 NOTE — Progress Notes (Unsigned)
Subjective:   Tina Frank is a 82 y.o. female who presents for Medicare Annual (Subsequent) preventive examination.  Visit Complete: {VISITMETHODVS:[phone]}  Patient Medicare AWV questionnaire was completed by the patient on ***; I have confirmed that all information answered by patient is correct and no changes since this date.        Objective:    Today's Vitals   02/20/23 1533  BP: (!) 149/83  Pulse: 72  Resp: 20  Temp: (!) 97.3 F (36.3 C)  SpO2: 96%   There is no height or weight on file to calculate BMI.     02/19/2023    9:58 AM 01/26/2023    6:54 AM 01/23/2023   12:22 PM 01/20/2023    8:48 AM 12/22/2022    2:45 PM 12/09/2022    8:51 AM 10/20/2022    9:38 AM  Advanced Directives  Does Patient Have a Medical Advance Directive? Yes Yes Yes Yes No No No  Type of Advance Directive Out of facility DNR (pink MOST or yellow form);Healthcare Power of [place];Living will  Out of facility DNR (pink MOST or yellow form);Healthcare Power of [place];Living will Out of facility DNR (pink MOST or yellow form)     Does patient want to make changes to medical advance directive? No - Patient declined No - Patient declined No - Patient declined No - Patient declined     Copy of Healthcare Power of Attorney in Chart? No - copy requested  No - copy requested      Would patient like information on creating a medical advance directive? No - Patient declined  No - Patient declined  No - Patient declined No - Patient declined No - Patient declined  Pre-existing out of facility DNR order (yellow form or pink MOST form) Yellow form placed in chart (order not valid for inpatient use)  Yellow form placed in chart (order not valid for inpatient use) Pink MOST/Yellow Form most recent copy in chart - Physician notified to receive inpatient order       Current Medications (verified) Outpatient Encounter Medications as of 02/20/2023  Medication Sig   acetaminophen (TYLENOL) 325 MG tablet  Take 650 mg by mouth every 4 (four) hours as needed for moderate pain (pain score 4-6).   allopurinol (ZYLOPRIM) 300 MG tablet Take 300 mg by mouth daily.   aspirin EC 81 MG tablet Take 1 tablet (81 mg total) by mouth daily. Swallow whole. (Patient not taking: Reported on 02/19/2023)   carbamide peroxide (DEBROX) 6.5 % OTIC solution Place 5 drops into both ears daily. (Patient not taking: Reported on 02/19/2023)   cloNIDine (CATAPRES) 0.1 MG tablet Take 0.1 mg by mouth daily as needed (for hypertension- if systolic b/p is over 160).   feeding supplement (ENSURE ENLIVE / ENSURE PLUS) LIQD Take 237 mLs by mouth 2 (two) times daily between meals.   ferrous sulfate 325 (65 FE) MG tablet Take 325 mg by mouth See admin instructions. Take 325 mg by mouth by mouth once daily on Monday, Wednesday, and Friday   folic acid (FOLVITE) 1 MG tablet Take 1 mg by mouth daily. (Patient not taking: Reported on 02/19/2023)   hydrALAZINE (APRESOLINE) 100 MG tablet Take 1 tablet (100 mg total) by mouth 3 (three) times daily.   magnesium oxide (MAG-OX) 400 (240 Mg) MG tablet Take 1 tablet (400 mg total) by mouth daily.   meclizine (ANTIVERT) 12.5 MG tablet Take 1 tablet (12.5 mg total) by mouth 3 (three) times daily  as needed for dizziness. (Patient not taking: Reported on 02/19/2023)   pantoprazole (PROTONIX) 40 MG tablet Take 1 tablet (40 mg total) by mouth 2 (two) times daily before a meal. Take 40 mg by mouth in the morning and at 5 PM- BEFORE FOOD   polyethylene glycol (MIRALAX / GLYCOLAX) 17 g packet Take 17 g by mouth 2 (two) times daily. (Patient not taking: Reported on 02/19/2023)   Rivaroxaban (XARELTO) 15 MG TABS tablet Take 1 tablet (15 mg total) by mouth daily with supper. (Patient taking differently: Take 15 mg by mouth every evening.)   senna-docusate (SENOKOT-S) 8.6-50 MG tablet Take 1 tablet by mouth 2 (two) times daily. (Patient not taking: Reported on 02/19/2023)   tamsulosin (FLOMAX) 0.4 MG CAPS capsule  Take 1 capsule (0.4 mg total) by mouth daily.   Zinc Oxide 10 % OINT Apply 1 Application topically See admin instructions. 1 application to buttocks every shift for redness   No facility-administered encounter medications on file as of 02/20/2023.    Allergies (verified) Abaloparatide, Mobic [meloxicam], Crestor [rosuvastatin], Norvasc [amlodipine besylate], Simvastatin, Sulfa antibiotics, and Zetia [ezetimibe]   History: Past Medical History:  Diagnosis Date   CKD (chronic kidney disease), stage II    nephrologist-- dr Kathrene Bongo  [facility])   Diabetes mellitus type 2, diet-controlled (HCC)    followed by pcp---   (09-20-2019  per pt does not check blood sugar at home)   Fracture of humeral shaft, right, closed 04/25/2022   GERD (gastroesophageal reflux disease)    History of primary hyperparathyroidism    s/p  parathyroidectomy right inferior on 12-02-2012   Humerus fracture 04/25/2022   Hyperlipidemia    Hypertension    followd by nephrologist----  (09-20-2019  per pt had stress test done some time ago , unsure when/ where, thinks told ok)   OA (osteoarthritis)    Osteoporosis    Prosthetic joint infection (HCC)    followed by dr j. hatcher ([facility])   left total shoulder arthroplasty 12/ 2016  ; 12/ 2020  revision with explant joint replacement and hemiarthroplasty;  completed 6 month antibiotic 05/ 2021   Vertigo    Past Surgical History:  Procedure Laterality Date   COLONOSCOPY     DILATATION & CURETTAGE/HYSTEROSCOPY WITH MYOSURE N/A 09/28/2019   Procedure: DILATATION & CURETTAGE/HYSTEROSCOPY WITH MYOSURE;  Surgeon: Olivia Mackie, MD;  Location: [facility];  Service: Gynecology;  Laterality: N/A;   EYE SURGERY  yrs ago   laser surgery for retinal tear.  (unilateral , pt unsure which side)   ORIF HUMERUS FRACTURE Right 04/27/2022   Procedure: OPEN REDUCTION INTERNAL FIXATION (ORIF) PROXIMAL HUMERUS FRACTURE;  Surgeon: Bjorn Pippin, MD;  Location: [facility]  ORS;  Service: Orthopedics;  Laterality: Right;   PARATHYROIDECTOMY N/A 12/02/2012   Procedure: PARATHYROIDECTOMY with frozen section ;  Surgeon: Velora Heckler, MD;  Location: [facility];  Service: General;  Laterality: N/A;   REVERSE SHOULDER ARTHROPLASTY Right 04/27/2022   Procedure: REVERSE SHOULDER ARTHROPLASTY;  Surgeon: Bjorn Pippin, MD;  Location: [facility];  Service: Orthopedics;  Laterality: Right;   SHOULDER INJECTION Left 07/27/2013   Procedure: SHOULDER INJECTION;  Surgeon: Loreta Ave, MD;  Location: [facility] OR;  Service: Orthopedics;  Laterality: Left;   STERIOD INJECTION Right 02/14/2015   Procedure: RIGHT SHOULDER CORTISONE INJECTION;  Surgeon: Loreta Ave, MD;  Location: [facility];  Service: Orthopedics;  Laterality: Right;   TOTAL HIP ARTHROPLASTY Left 07/27/2013   Procedure: TOTAL HIP ARTHROPLASTY  ANTERIOR APPROACH;  Surgeon: Loreta Ave, MD;  Location: [facility] OR;  Service: Orthopedics;  Laterality: Left;   TOTAL SHOULDER ARTHROPLASTY Left 02/14/2015   Procedure: LEFT TOTAL SHOULDER ARTHROPLASTY;  Surgeon: Loreta Ave, MD;  Location: [facility] OR;  Service: Orthopedics;  Laterality: Left;   TOTAL SHOULDER REVISION Left 02/09/2019   Procedure: TOTAL SHOULDER REVISION;  Surgeon: Bjorn Pippin, MD;  Location: [facility];  Service: Orthopedics;  Laterality: Left;   Family History  Problem Relation Age of Onset   Heart failure Mother    Cancer Mother    Breast cancer Mother        in her 5s   Cancer Father    Social History   Socioeconomic History   Marital status: Widowed    Spouse name: Not on file   Number of children: Not on file   Years of education: Not on file   Highest education level: Not on file  Occupational History   Not on file  Tobacco Use   Smoking status: Former    Current packs/day: 0.00    Average packs/day: 1 pack/day for 20.0 years (20.0 ttl pk-yrs)    Types: Cigarettes    Start date: 10/28/1967    Quit date: 10/28/1987    Years since quitting: 35.3   Smokeless  tobacco: Never  Vaping Use   Vaping status: Never Used  Substance and Sexual Activity   Alcohol use: No   Drug use: Never   Sexual activity: Yes    Birth control/protection: Post-menopausal  Other Topics Concern   Not on file  Social History Narrative   Not on file   Social Drivers of Health   Financial Resource Strain: Not on file  Food Insecurity: No Food Insecurity (12/22/2022)   Hunger Vital Sign    Worried About Running Out of Food in the Last Year: Never true    Ran Out of Food in the Last Year: Never true  Transportation Needs: No Transportation Needs (12/22/2022)   PRAPARE - Administrator, Civil Service (Medical): No    Lack of Transportation (Non-Medical): No  Physical Activity: Not on file  Stress: Not on file  Social Connections: Not on file    Tobacco Counseling Counseling given: Not Answered   Clinical Intake:                        Activities of Daily Living    12/22/2022   11:00 PM 10/13/2022    2:47 AM  In your present state of health, do you have any difficulty performing the following activities:  Hearing? 1 1  Vision? 0 1  Difficulty concentrating or making decisions? 1 1  Walking or climbing stairs?  1  Dressing or bathing?  1  Doing errands, shopping? 1 1    Patient Care Team: Sigmund Hazel, MD as PCP - General (Family Medicine) Tessa Lerner, DO as PCP - Cardiology (Cardiology) Glyn Ade, PA-C as Physician Assistant (Dermatology)  Indicate any recent Medical Services you may have received from other than [facility] providers in the past year (date may be approximate).     Assessment:   This is a routine wellness examination for [facility].  Hearing/Vision screen No results found.   Goals Addressed   None    Depression Screen    07/19/2019    2:20 PM 04/12/2019    2:21 PM 01/18/2019    2:16 PM 12/17/2018   10:30 AM 05/07/2018  9:43 AM 03/16/2018    2:38 PM  PHQ 2/9 Scores  PHQ - 2 Score 0 0 0 0 0 0     Fall Risk    07/19/2019    2:20 PM 04/12/2019    2:21 PM 01/18/2019    2:16 PM 12/17/2018   10:30 AM 05/07/2018    9:42 AM  Fall Risk   Falls in the past year? 0 0 0 0 0  Number falls in past yr:   0    Injury with Fall?   0    Follow up Falls evaluation completed Falls evaluation completed  Falls evaluation completed     MEDICARE RISK AT HOME:    TIMED UP AND GO:  Was the test performed?  {AMBTIMEDUPGO:[id number]}    Cognitive Function:        Immunizations Immunization History  Administered Date(s) Administered   Influenza Split 11/24/2008   Influenza, High Dose Seasonal PF 03/16/2014, 01/14/2016, 01/21/2017, 02/24/2022   Influenza,inj,Quad PF,6+ Mos 11/20/2010, 02/01/2013   Influenza-Unspecified 11/20/2010, 02/01/2013, 02/19/2018, 02/24/2022   Moderna Sars-Covid-2 Vaccination 04/23/2019, 05/21/2019, 02/20/2020   Pneumococcal Conjugate-13 03/16/2014   Pneumococcal Polysaccharide-23 04/28/2005   Td 04/28/2005   Td (Adult) 04/28/2005   Tdap 06/08/2017   Zoster Recombinant(Shingrix) 01/14/2019   Zoster, Live 07/21/2007    {TDAP status:2101805}  {Flu Vaccine status:2101806}  {Pneumococcal vaccine status:2101807}  {Covid-19 vaccine status:2101808}  Qualifies for Shingles Vaccine? {YES/NO:21197}  Zostavax completed {YES/NO:21197}  {Shingrix Completed?:2101804}  Screening Tests Health Maintenance  Topic Date Due   OPHTHALMOLOGY EXAM  Never done   Diabetic kidney evaluation - Urine ACR  01/26/2014   Zoster Vaccines- Shingrix (2 of 2) 03/11/2019   FOOT EXAM  10/01/2022   INFLUENZA VACCINE  10/09/2022   COVID-19 Vaccine (4 - 2024-25 season) 11/09/2022   Medicare Annual Wellness (AWV)  02/22/2023   HEMOGLOBIN A1C  04/15/2023   Diabetic kidney evaluation - eGFR measurement  01/26/2024   DTaP/Tdap/Td (4 - Td or Tdap) 06/09/2027   Pneumonia Vaccine 23+ Years old  Completed   DEXA SCAN  Completed   HPV VACCINES  Aged Out    Health Maintenance  Health  Maintenance Due  Topic Date Due   OPHTHALMOLOGY EXAM  Never done   Diabetic kidney evaluation - Urine ACR  01/26/2014   Zoster Vaccines- Shingrix (2 of 2) 03/11/2019   FOOT EXAM  10/01/2022   INFLUENZA VACCINE  10/09/2022   COVID-19 Vaccine (4 - 2024-25 season) 11/09/2022   Medicare Annual Wellness (AWV)  02/22/2023    {Colorectal cancer screening:2101809}  {Mammogram status:21018020}  {Bone Density status:21018021}  Lung Cancer Screening: (Low Dose CT Chest recommended if Age 54-80 years, 20 pack-year currently smoking OR have quit w/in 15years.) {DOES NOT does:27190::"does not"} qualify.   Lung Cancer Screening Referral: ***  Additional Screening:  Hepatitis C Screening: {DOES NOT does:27190::"does not"} qualify; Completed ***  Vision Screening: Recommended annual ophthalmology exams for early detection of glaucoma and other disorders of the eye. Is the patient up to date with their annual eye exam?  {YES/NO:21197} Who is the provider or what is the name of the office in which the patient attends annual eye exams? *** If pt is not established with a provider, would they like to be referred to a provider to establish care? {YES/NO:21197}.   Dental Screening: Recommended annual dental exams for proper oral hygiene  Diabetic Foot Exam: {Diabetic Foot Exam:2101802}  Community Resource Referral / Chronic Care Management: CRR required this visit?  {YES/NO:21197}  CCM  required this visit?  {CCM Required choices:[phone]}     Plan:     I have personally reviewed and noted the following in the patient's chart:   Medical and social history Use of alcohol, tobacco or illicit drugs  Current medications and supplements including opioid prescriptions. {Opioid [phone]} Functional ability and status Nutritional status Physical activity Advanced directives List of other physicians Hospitalizations, surgeries, and ER visits in previous 12  months Vitals Screenings to include cognitive, depression, and falls Referrals and appointments  In addition, I have reviewed and discussed with patient certain preventive protocols, quality metrics, and best practice recommendations. A written personalized care plan for preventive services as well as general preventive health recommendations were provided to patient.     Tezra Mahr X Sherrick Araki, NP   02/20/2023   After Visit Summary: {CHL AMB AWV After Visit Summary:[phone]}  Nurse Notes: ***

## 2023-02-26 ENCOUNTER — Encounter: Payer: Self-pay | Admitting: Sports Medicine

## 2023-02-26 ENCOUNTER — Non-Acute Institutional Stay (SKILLED_NURSING_FACILITY): Payer: Self-pay | Admitting: Sports Medicine

## 2023-02-26 DIAGNOSIS — Z66 Do not resuscitate: Secondary | ICD-10-CM

## 2023-02-26 DIAGNOSIS — K068 Other specified disorders of gingiva and edentulous alveolar ridge: Secondary | ICD-10-CM

## 2023-02-26 DIAGNOSIS — K219 Gastro-esophageal reflux disease without esophagitis: Secondary | ICD-10-CM | POA: Diagnosis not present

## 2023-02-26 LAB — PROTEIN / CREATININE RATIO, URINE
Albumin, U: 4.6
Creatinine, Urine: 19

## 2023-02-26 LAB — MICROALBUMIN / CREATININE URINE RATIO: Microalb Creat Ratio: 242

## 2023-02-26 NOTE — Progress Notes (Signed)
Location:  [facility] Room Number: N061-A Place of Service:  SNF [phone]) Provider:  Willey Blade, MD   Patient Care Team: Sigmund Hazel, MD as PCP - General (Family Medicine) Tessa Lerner, DO as PCP - Cardiology (Cardiology) Suzi Roots as Physician Assistant (Dermatology)  Extended Emergency Contact Information Primary Emergency Contact: [name], [place] [address] [place] Home Phone: [phone] Work Phone: [phone] Mobile Phone: [phone] Relation: Son Secondary Emergency Contact: Ewart,[name]  [place] Phone: [phone] Relation: Son  Code Status:  DNR Goals of care: Advanced Directive information    02/26/2023   10:36 AM  Advanced Directives  Does Patient Have a Medical Advance Directive? Yes  Type of Advance Directive Out of facility DNR (pink MOST or yellow form);Healthcare Power of [place];Living will  Copy of Healthcare Power of Attorney in Chart? Yes - validated most recent copy scanned in chart (See row information)  Would patient like information on creating a medical advance directive? No - Patient declined  Pre-existing out of facility DNR order (yellow form or pink MOST form) Yellow form placed in chart (order not valid for inpatient use)     Chief Complaint  Patient presents with   Acute Visit    Bleeding gums     HPI:  Pt is a 82 y.o. female seen today for an acute visit for bleeding from gums. Patient seen and examined in her room.  She is sitting comfortably in her recliner watching TV.  Patient reports that she had a bleeding from her gums last night and none since then.  Patient has a recent history of bleeding from gums.  She was evaluated by dentist and had a cleaning completed antibiotic prophylaxis. Patient denies being dizzy, lightheaded, bloody or dark stools.  GERD-patient complains of acid reflux, heartburn. Denies abdominal pain,  nausea, vomiting, bloody or dark stools.   Past Medical History:  Diagnosis Date   CKD (chronic kidney disease), stage II    nephrologist-- dr Kathrene Bongo  [facility])   Diabetes mellitus type 2, diet-controlled (HCC)    followed by pcp---   (09-20-2019  per pt does not check blood sugar at home)   Fracture of humeral shaft, right, closed 04/25/2022   GERD (gastroesophageal reflux disease)    History of primary hyperparathyroidism    s/p  parathyroidectomy right inferior on 12-02-2012   Humerus fracture 04/25/2022   Hyperlipidemia    Hypertension    followd by nephrologist----  (09-20-2019  per pt had stress test done some time ago , unsure when/ where, thinks told ok)   OA (osteoarthritis)    Osteoporosis    Prosthetic joint infection (HCC)    followed by dr j. hatcher (ID)   left total shoulder arthroplasty 12/ 2016  ; 12/ 2020  revision with explant joint replacement and hemiarthroplasty;  completed 6 month antibiotic 05/ 2021   Vertigo    Past Surgical History:  Procedure Laterality Date   COLONOSCOPY     DILATATION & CURETTAGE/HYSTEROSCOPY WITH MYOSURE N/A 09/28/2019   Procedure: DILATATION & CURETTAGE/HYSTEROSCOPY WITH MYOSURE;  Surgeon: Olivia Mackie, MD;  Location: [facility];  Service: Gynecology;  Laterality: N/A;   EYE SURGERY  yrs ago   laser surgery for retinal tear.  (unilateral , pt unsure which side)   ORIF HUMERUS FRACTURE Right 04/27/2022   Procedure: OPEN REDUCTION INTERNAL FIXATION (ORIF) PROXIMAL HUMERUS FRACTURE;  Surgeon: Everardo Pacific,  Murriel Hopper, MD;  Location: [facility];  Service: Orthopedics;  Laterality: Right;   PARATHYROIDECTOMY N/A 12/02/2012   Procedure: PARATHYROIDECTOMY with frozen section ;  Surgeon: Velora Heckler, MD;  Location: [facility];  Service: General;  Laterality: N/A;   REVERSE SHOULDER ARTHROPLASTY Right 04/27/2022   Procedure: REVERSE SHOULDER ARTHROPLASTY;  Surgeon: Bjorn Pippin, MD;  Location: [facility];  Service: Orthopedics;   Laterality: Right;   SHOULDER INJECTION Left 07/27/2013   Procedure: SHOULDER INJECTION;  Surgeon: Loreta Ave, MD;  Location: [facility] OR;  Service: Orthopedics;  Laterality: Left;   STERIOD INJECTION Right 02/14/2015   Procedure: RIGHT SHOULDER CORTISONE INJECTION;  Surgeon: Loreta Ave, MD;  Location: [facility] OR;  Service: Orthopedics;  Laterality: Right;   TOTAL HIP ARTHROPLASTY Left 07/27/2013   Procedure: TOTAL HIP ARTHROPLASTY ANTERIOR APPROACH;  Surgeon: Loreta Ave, MD;  Location: [facility] OR;  Service: Orthopedics;  Laterality: Left;   TOTAL SHOULDER ARTHROPLASTY Left 02/14/2015   Procedure: LEFT TOTAL SHOULDER ARTHROPLASTY;  Surgeon: Loreta Ave, MD;  Location: [facility] OR;  Service: Orthopedics;  Laterality: Left;   TOTAL SHOULDER REVISION Left 02/09/2019   Procedure: TOTAL SHOULDER REVISION;  Surgeon: Bjorn Pippin, MD;  Location: [facility];  Service: Orthopedics;  Laterality: Left;    Allergies  Allergen Reactions   Abaloparatide Other (See Comments)    "Burred vision, lightheaded" (patient does not recall this in 2024)   Mobic [Meloxicam] Hypertension   Crestor [Rosuvastatin] Other (See Comments)    Muscle aches   Norvasc [Amlodipine Besylate] Swelling and Other (See Comments)    Ankle swelling   Simvastatin Other (See Comments)    Muscle aches, high LFTs   Sulfa Antibiotics Rash   Zetia [Ezetimibe] Other (See Comments)    Muscle aches    Outpatient Encounter Medications as of 02/26/2023  Medication Sig   acetaminophen (TYLENOL) 325 MG tablet Take 650 mg by mouth every 4 (four) hours as needed for moderate pain (pain score 4-6).   allopurinol (ZYLOPRIM) 300 MG tablet Take 300 mg by mouth daily.   cloNIDine (CATAPRES) 0.1 MG tablet Take 0.1 mg by mouth daily as needed (for hypertension- if systolic b/p is over 160).   ferrous sulfate 325 (65 FE) MG tablet Take 325 mg by mouth See admin instructions. Take 325 mg by mouth by mouth once daily on Monday, Wednesday, and Friday    hydrALAZINE (APRESOLINE) 100 MG tablet Take 1 tablet (100 mg total) by mouth 3 (three) times daily.   magnesium oxide (MAG-OX) 400 (240 Mg) MG tablet Take 1 tablet (400 mg total) by mouth daily.   meclizine (ANTIVERT) 12.5 MG tablet Take 1 tablet (12.5 mg total) by mouth 3 (three) times daily as needed for dizziness.   pantoprazole (PROTONIX) 40 MG tablet Take 1 tablet (40 mg total) by mouth 2 (two) times daily before a meal. Take 40 mg by mouth in the morning and at 5 PM- BEFORE FOOD   Rivaroxaban (XARELTO) 15 MG TABS tablet Take 1 tablet (15 mg total) by mouth daily with supper.   tamsulosin (FLOMAX) 0.4 MG CAPS capsule Take 1 capsule (0.4 mg total) by mouth daily.   aspirin EC 81 MG tablet Take 1 tablet (81 mg total) by mouth daily. Swallow whole. (Patient not taking: Reported on 01/30/2023)   carbamide peroxide (DEBROX) 6.5 % OTIC solution Place 5 drops into both ears daily. (Patient not taking: Reported on 01/30/2023)   feeding supplement (ENSURE ENLIVE / ENSURE PLUS) LIQD Take  237 mLs by mouth 2 (two) times daily between meals.   folic acid (FOLVITE) 1 MG tablet Take 1 mg by mouth daily. (Patient not taking: Reported on 01/30/2023)   polyethylene glycol (MIRALAX / GLYCOLAX) 17 g packet Take 17 g by mouth 2 (two) times daily. (Patient not taking: Reported on 01/20/2023)   senna-docusate (SENOKOT-S) 8.6-50 MG tablet Take 1 tablet by mouth 2 (two) times daily. (Patient not taking: Reported on 01/20/2023)   Zinc Oxide 10 % OINT Apply 1 Application topically See admin instructions. 1 application to buttocks every shift for redness (Patient not taking: Reported on 02/26/2023)   No facility-administered encounter medications on file as of 02/26/2023.    Review of Systems  Constitutional:  Negative for fever.  HENT:  Negative for sore throat.   Respiratory:  Negative for cough, shortness of breath and wheezing.   Cardiovascular:  Negative for chest pain, palpitations and leg swelling.   Gastrointestinal:  Negative for abdominal distention, abdominal pain, blood in stool, constipation, diarrhea, nausea and vomiting.  Genitourinary:  Negative for dysuria, frequency and urgency.  Neurological:  Negative for dizziness, weakness and numbness.  Psychiatric/Behavioral:  Negative for confusion.     Immunization History  Administered Date(s) Administered   Influenza Split 11/24/2008   Influenza, High Dose Seasonal PF 03/16/2014, 01/14/2016, 01/21/2017, 02/24/2022   Influenza,inj,Quad PF,6+ Mos 11/20/2010, 02/01/2013   Influenza-Unspecified 11/20/2010, 02/01/2013, 02/19/2018, 02/24/2022   Moderna Sars-Covid-2 Vaccination 04/23/2019, 05/21/2019, 02/20/2020   Pneumococcal Conjugate-13 03/16/2014   Pneumococcal Polysaccharide-23 04/28/2005   Td 04/28/2005   Td (Adult) 04/28/2005   Tdap 06/08/2017   Zoster Recombinant(Shingrix) 01/14/2019   Zoster, Live 07/21/2007   Pertinent  Health Maintenance Due  Topic Date Due   OPHTHALMOLOGY EXAM  Never done   HEMOGLOBIN A1C  04/15/2023   FOOT EXAM  02/20/2024   DEXA SCAN  Completed   INFLUENZA VACCINE  Discontinued      04/12/2019    2:21 PM 07/19/2019    2:20 PM 09/28/2019    8:51 AM 06/21/2020    5:59 PM 09/23/2021    1:10 PM  Fall Risk  Falls in the past year? 0 0     (RETIRED) Patient Fall Risk Level Low fall risk Low fall risk Moderate fall risk Low fall risk Low fall risk  Fall risk Follow up Falls evaluation completed Falls evaluation completed      Functional Status Survey:    Vitals:   02/26/23 1022  BP: (!) 130/54  Pulse: 84  Resp: (!) 84  Temp: (!) 97.2 F (36.2 C)  SpO2: 97%  Weight: 129 lb 6.4 oz (58.7 kg)  Height: 5\' 6"  (1.676 m)   Body mass index is 20.89 kg/m. Physical Exam Constitutional:      Appearance: Normal appearance.  HENT:     Head: Normocephalic and atraumatic.  Cardiovascular:     Rate and Rhythm: Normal rate and regular rhythm.  Pulmonary:     Effort: Pulmonary effort is normal. No  respiratory distress.     Breath sounds: Normal breath sounds. No wheezing.  Abdominal:     General: Bowel sounds are normal. There is no distension.     Tenderness: There is no abdominal tenderness. There is no guarding or rebound.     Comments:    Musculoskeletal:        General: No swelling or tenderness.  Neurological:     Mental Status: She is alert. Mental status is at baseline.     Sensory: No sensory  deficit.     Motor: No weakness.     Labs reviewed: Recent Labs    01/06/23 0926 01/07/23 0425 01/08/23 0413 01/16/23 0000 01/19/23 0000 01/26/23 0718 01/29/23 0000  NA 134* 134* 135   < > 137 135 136*  K 3.6 3.6 3.1*   < > 3.4* 4.1 3.8  CL 106 106 106   < > 105 109 105  CO2 22 19* 22  --  23* 21* 24*  GLUCOSE 115* 89 95  --   --  98  --   BUN 14 13 13    < > 8 9 8   CREATININE 0.62 0.65 0.61   < > 0.5 0.95 0.8  CALCIUM 9.9 9.2 9.1   < > 8.4* 8.8* 9.6  MG 1.9 2.2 2.4  --   --   --   --   PHOS 2.0* 2.6 2.2*  --   --   --   --    < > = values in this interval not displayed.   Recent Labs    10/13/22 0738 12/24/22 0347 12/28/22 1256 01/01/23 0416 01/08/23 0413 01/16/23 0000 01/19/23 0000  AST 14*  --  17  --  17 11* 10*  ALT 10  --  14  --  11 7 3*  ALKPHOS 56  --  85  --  64 72 79  BILITOT 1.2  --  0.8  --  0.6  --   --   PROT 5.1*  --  6.5  --  5.6*  --   --   ALBUMIN 2.6*   < > 3.0*   < > 2.7* 2.5* 2.6*   < > = values in this interval not displayed.   Recent Labs    01/07/23 0425 01/08/23 0413 01/08/23 0413 01/16/23 0000 01/19/23 0000 01/26/23 0718 01/29/23 0000  WBC 6.4 8.8   < > 5.5 6.7 6.0 7.4  NEUTROABS  --  6.5  --  3,641.00 4,730.00 3.9  --   HGB 8.8* 9.2*  --  8.1* 8.0* 7.9* 9.2*  HCT 29.3* 29.3*  --  25* 24* 26.1* 29*  MCV 99.3 96.7  --   --   --  94.6  --   PLT 231 254  --  315 324 263 346   < > = values in this interval not displayed.   Lab Results  Component Value Date   TSH 1.499 12/28/2022   Lab Results  Component Value Date    HGBA1C 6.0 (H) 10/13/2022   Lab Results  Component Value Date   CHOL 121 10/13/2022   HDL 44 10/13/2022   LDLCALC 62 10/13/2022   TRIG 74 10/13/2022   CHOLHDL 2.8 10/13/2022    Significant Diagnostic Results in last 30 days:  No results found.  Assessment/Plan  Gum bleeding  No further episodes Pt needs to follow up with dental  GERD Pt c/o acid reflux Instructed to avoid spicy foods Cont with omeprazole Will start famotidine 20 mg twice daily     Family/ staff Communication: care plan discussed with the nursing staff   30 minTotal time spent for obtaining history,  performing a medically appropriate examination and evaluation, reviewing the tests,ordering  tests,  documenting clinical information in the electronic or other health record, independently interpreting results ,care coordination (not separately reported)

## 2023-02-27 ENCOUNTER — Encounter: Payer: Self-pay | Admitting: Sports Medicine

## 2023-03-02 ENCOUNTER — Encounter: Payer: Self-pay | Admitting: Nurse Practitioner

## 2023-03-02 ENCOUNTER — Non-Acute Institutional Stay (SKILLED_NURSING_FACILITY): Payer: Medicare PPO | Admitting: Nurse Practitioner

## 2023-03-02 DIAGNOSIS — D509 Iron deficiency anemia, unspecified: Secondary | ICD-10-CM

## 2023-03-02 DIAGNOSIS — I10 Essential (primary) hypertension: Secondary | ICD-10-CM

## 2023-03-02 DIAGNOSIS — I4891 Unspecified atrial fibrillation: Secondary | ICD-10-CM | POA: Diagnosis not present

## 2023-03-02 DIAGNOSIS — M6281 Muscle weakness (generalized): Secondary | ICD-10-CM | POA: Diagnosis not present

## 2023-03-02 DIAGNOSIS — K219 Gastro-esophageal reflux disease without esophagitis: Secondary | ICD-10-CM

## 2023-03-02 DIAGNOSIS — R7303 Prediabetes: Secondary | ICD-10-CM | POA: Diagnosis not present

## 2023-03-02 DIAGNOSIS — N3000 Acute cystitis without hematuria: Secondary | ICD-10-CM | POA: Diagnosis not present

## 2023-03-02 DIAGNOSIS — N130 Hydronephrosis with ureteropelvic junction obstruction: Secondary | ICD-10-CM | POA: Diagnosis not present

## 2023-03-02 DIAGNOSIS — R2681 Unsteadiness on feet: Secondary | ICD-10-CM | POA: Diagnosis not present

## 2023-03-02 NOTE — Assessment & Plan Note (Signed)
Blood pressure is controlled,  on Hydralazine,  prn Clonidine, Bun/creat 8/0.81 01/29/23

## 2023-03-02 NOTE — Assessment & Plan Note (Signed)
worsened GERD, c/o vomited x1 yesterday, better today.  Continue Pantoprazole bid, Famotidine bid Adding Carafate 10ml qid x 1 wk Hold Fe x 1 wk Update CBC/diff, CMP/eGFR May consider Reglan if no better.

## 2023-03-02 NOTE — Assessment & Plan Note (Signed)
Hgb 9.2 01/29/23, added Vit B12(Vit B12 273 12/10/22) and Fe 01/15/23.  Hold Fe x 1wk 2/2 c/o worsened GERD symptoms.

## 2023-03-02 NOTE — Assessment & Plan Note (Signed)
Hgb A1c 6.0 10/13/22, TSH 1.293 10/12/22, ACR 242(<30)02/26/23

## 2023-03-02 NOTE — Assessment & Plan Note (Signed)
 Heart rate is in control, on Xarelto

## 2023-03-02 NOTE — Progress Notes (Signed)
Location:   SNF [facility] Room Number: 86 Place of Service:  SNF (31) Provider: Arna Snipe Belynda Pagaduan NP  Tina Hazel, MD  Patient Care Team: Tina Hazel, MD as PCP - General (Family Medicine) Tina Lerner, DO as PCP - Cardiology (Cardiology) Tina Frank as Physician Assistant (Dermatology)  Extended Emergency Contact Information Primary Emergency Contact: Tina Frank, [place] [address] [place] Home Phone: [phone] Work Phone: [phone] Mobile Phone: [phone] Relation: Son Secondary Emergency Contact: Tina Frank  [place] Phone: [phone] Relation: Son  Code Status: DNR Goals of care: Advanced Directive information    03/06/2023    1:26 PM  Advanced Directives  Does Patient Have a Medical Advance Directive? Yes  Type of Advance Directive Out of facility DNR (pink MOST Frank yellow form);Healthcare Power of Tina Frank;Living will  Does patient want to make changes to medical advance directive? No - Patient declined  Copy of Healthcare Power of Attorney in Chart? Yes - validated most recent copy scanned in chart (See row information)  Would patient like information on creating a medical advance directive? No - Patient declined  Pre-existing out of facility DNR order (yellow form Frank pink MOST form) Yellow form placed in chart (order not valid for inpatient use)     Chief Complaint  Patient presents with   Acute Visit    Worsened GERD, c/o vomited x1 yesterday, better today.     HPI:  Pt is a 82 y.o. female seen today for an acute visit for worsened GERD, c/o vomited x1 yesterday, better today.   GERD, takes Pantoprazole bid, Famotidine bid. Last BM yesterday, denied abd pain, nausea, vomiting, SOB today.    Anemia, Hgb 9.2 01/29/23, added Vit B12(Vit B12 273 12/10/22) and Fe 01/15/23.              New onset Atrial Fibrillation -2D echo from 10/13/2022 with a EF of 60 to 65%, grade 1 DD and mildly  dilated left atrium. -Patient with history of bradycardia arrhythmia for which she was sent to cardiology in May 2024. -Patient felt not a great candidate for long-term anticoagulation but fall risk now is lower due to debility per son. -Patient placed on Xarelto which is preferred medication per family. -Patient has been placed on full dose Lovenox in anticipation of bone marrow biopsy which was done 01/06/2023.   -Resuming Rivaroxaban 01/07/2023.               Saw oncology 01/19/23 MGUS, negative cone marrow biopsy, moderate risk of progression to IgM myeloma Frank [place] macroglobulinemia(19-37% in next 20 years), f/u.              Hydronephrosis: f/u urology for renal mass left 1.9x1.6cm, pending MRI             Hospitalized 12/22/22 for metabolic encephalopathy 2/2 to UTI, AKI, and hypercalcemia.              Dysphagia, f/u ST             Hospitalized 10/12/22-10/17/22 UTI, encephalopathy, acute CVA             OA R+L knee pain, f/u Ortho for Gel inj             CVA resolved difficulty word finding, off Statin, ASA, f/u stroke clinic, MRI small punctate infarct in the posteerior limb of R internal capsule, no focal weakness  Gout, stable, takes Allopurinol             HLD, off Atorvastatin, LDL 62 10/13/22             HTN, on Hydralazine,  prn Clonidine, Bun/creat 8/0.81 01/29/23             CXR 10/14/22 pulm edema/volume overload, not apparent, prn Furosemide, BNP 950.8 10/14/22, Echo 60-65% EF 10/13/22             Constipation, stable, diet controlled             Prediabetes Hgb A1c 6.0 10/13/22, TSH 1.293 10/12/22, ACR 242(<30)02/26/23             Positive anti CCP test, f/u Rheumatology              Bradycardia, Hx of, off Carvedilol              Hx of urinary retention,  taking Tamsulosin.              Past Medical History:  Diagnosis Date   CKD (chronic kidney disease), stage II    nephrologist-- dr Tina Frank  [facility])   Diabetes mellitus type 2, diet-controlled (HCC)     followed by pcp---   (09-20-2019  per pt does not check blood sugar at home)   Fracture of humeral shaft, right, closed 04/25/2022   GERD (gastroesophageal reflux disease)    History of primary hyperparathyroidism    s/p  parathyroidectomy right inferior on 12-02-2012   Humerus fracture 04/25/2022   Hyperlipidemia    Hypertension    followd by nephrologist----  (09-20-2019  per pt had stress test done some time ago , unsure when/ where, thinks told ok)   OA (osteoarthritis)    Osteoporosis    Prosthetic joint infection (HCC)    followed by dr j. hatcher (ID)   left total shoulder arthroplasty 12/ 2016  ; 12/ 2020  revision with explant joint replacement and hemiarthroplasty;  completed 6 month antibiotic 05/ 2021   Vertigo    Past Surgical History:  Procedure Laterality Date   COLONOSCOPY     DILATATION & CURETTAGE/HYSTEROSCOPY WITH MYOSURE N/A 09/28/2019   Procedure: DILATATION & CURETTAGE/HYSTEROSCOPY WITH MYOSURE;  Surgeon: Tina Mackie, MD;  Location: [facility];  Service: Gynecology;  Laterality: N/A;   EYE SURGERY  yrs ago   laser surgery for retinal tear.  (unilateral , pt unsure which side)   ORIF HUMERUS FRACTURE Right 04/27/2022   Procedure: OPEN REDUCTION INTERNAL FIXATION (ORIF) PROXIMAL HUMERUS FRACTURE;  Surgeon: Tina Pippin, MD;  Location: [facility];  Service: Orthopedics;  Laterality: Right;   PARATHYROIDECTOMY N/A 12/02/2012   Procedure: PARATHYROIDECTOMY with frozen section ;  Surgeon: Tina Heckler, MD;  Location: [facility];  Service: General;  Laterality: N/A;   REVERSE SHOULDER ARTHROPLASTY Right 04/27/2022   Procedure: REVERSE SHOULDER ARTHROPLASTY;  Surgeon: Tina Pippin, MD;  Location: [facility];  Service: Orthopedics;  Laterality: Right;   SHOULDER INJECTION Left 07/27/2013   Procedure: SHOULDER INJECTION;  Surgeon: Tina Ave, MD;  Location: [facility];  Service: Orthopedics;  Laterality: Left;   STERIOD INJECTION Right 02/14/2015   Procedure: RIGHT  SHOULDER CORTISONE INJECTION;  Surgeon: Tina Ave, MD;  Location: [facility];  Service: Orthopedics;  Laterality: Right;   TOTAL HIP ARTHROPLASTY Left 07/27/2013   Procedure: TOTAL HIP ARTHROPLASTY ANTERIOR APPROACH;  Surgeon: Tina Ave, MD;  Location: [facility];  Service: Orthopedics;  Laterality: Left;   TOTAL SHOULDER ARTHROPLASTY Left 02/14/2015   Procedure: LEFT TOTAL SHOULDER ARTHROPLASTY;  Surgeon: Tina Ave, MD;  Location: [facility];  Service: Orthopedics;  Laterality: Left;   TOTAL SHOULDER REVISION Left 02/09/2019   Procedure: TOTAL SHOULDER REVISION;  Surgeon: Tina Pippin, MD;  Location: [facility];  Service: Orthopedics;  Laterality: Left;    Allergies  Allergen Reactions   Abaloparatide Other (See Comments)    "Burred vision, lightheaded" (patient does not recall this in 2024)   Mobic [Meloxicam] Hypertension   Crestor [Rosuvastatin] Other (See Comments)    Muscle aches   Norvasc [Amlodipine Besylate] Swelling and Other (See Comments)    Ankle swelling   Simvastatin Other (See Comments)    Muscle aches, high LFTs   Sulfa Antibiotics Rash   Zetia [Ezetimibe] Other (See Comments)    Muscle aches    Allergies as of 03/02/2023       Reactions   Abaloparatide Other (See Comments)   "Burred vision, lightheaded" (patient does not recall this in 2024)   Mobic [meloxicam] Hypertension   Crestor [rosuvastatin] Other (See Comments)   Muscle aches   Norvasc [amlodipine Besylate] Swelling, Other (See Comments)   Ankle swelling   Simvastatin Other (See Comments)   Muscle aches, high LFTs   Sulfa Antibiotics Rash   Zetia [ezetimibe] Other (See Comments)   Muscle aches        Medication List        Accurate as of March 02, 2023 11:59 PM. If you have any questions, ask your nurse Frank doctor.          acetaminophen 325 MG tablet Commonly known as: TYLENOL Take 650 mg by mouth every 4 (four) hours as needed for moderate pain (pain score 4-6).   allopurinol 300 MG  tablet Commonly known as: ZYLOPRIM Take 300 mg by mouth daily.   aspirin EC 81 MG tablet Take 1 tablet (81 mg total) by mouth daily. Swallow whole.   carbamide peroxide 6.5 % OTIC solution Commonly known as: DEBROX Place 5 drops into both ears daily.   cloNIDine 0.1 MG tablet Commonly known as: CATAPRES Take 0.1 mg by mouth daily as needed (for hypertension- if systolic b/p is over 160).   feeding supplement Liqd Take 237 mLs by mouth 2 (two) times daily between meals.   ferrous sulfate 325 (65 FE) MG tablet Take 325 mg by mouth See admin instructions. Take 325 mg by mouth by mouth once daily on Monday, Wednesday, and Friday   folic acid 1 MG tablet Commonly known as: FOLVITE Take 1 mg by mouth daily.   hydrALAZINE 100 MG tablet Commonly known as: APRESOLINE Take 1 tablet (100 mg total) by mouth 3 (three) times daily.   magnesium oxide 400 (240 Mg) MG tablet Commonly known as: MAG-OX Take 1 tablet (400 mg total) by mouth daily.   meclizine 12.5 MG tablet Commonly known as: ANTIVERT Take 1 tablet (12.5 mg total) by mouth 3 (three) times daily as needed for dizziness.   pantoprazole 40 MG tablet Commonly known as: PROTONIX Take 1 tablet (40 mg total) by mouth 2 (two) times daily before a meal. Take 40 mg by mouth in the morning and at 5 PM- BEFORE FOOD   polyethylene glycol 17 g packet Commonly known as: MIRALAX / GLYCOLAX Take 17 g by mouth 2 (two) times daily.   Rivaroxaban 15 MG Tabs tablet Commonly known as: XARELTO Take 1 tablet (15 mg total) by mouth  daily with supper.   senna-docusate 8.6-50 MG tablet Commonly known as: Senokot-S Take 1 tablet by mouth 2 (two) times daily.   tamsulosin 0.4 MG Caps capsule Commonly known as: FLOMAX Take 1 capsule (0.4 mg total) by mouth daily.   Zinc Oxide 10 % Oint Apply 1 Application topically See admin instructions. 1 application to buttocks every shift for redness        Review of Systems  Constitutional:   Negative for appetite change, fatigue and fever.  HENT:  Negative for congestion, mouth sores and trouble swallowing.   Eyes:  Negative for visual disturbance.  Respiratory:  Negative for cough and shortness of breath.   Cardiovascular:  Negative for leg swelling.  Gastrointestinal:  Negative for abdominal pain and constipation.       GERD symptoms intermittently  Genitourinary:  Negative for dysuria and urgency.       Nocturnal urination 2x average.   Musculoskeletal:  Positive for arthralgias and gait problem.       R+L knee pain, R+L shoulder limited overhead ROM, c/o pain in the R shoulder, mild.   Skin:  Negative for color change.  Neurological:  Negative for speech difficulty, weakness and headaches.  Psychiatric/Behavioral:  Positive for sleep disturbance. Negative for confusion. The patient is not nervous/anxious.        Sometimes not sleeping, declined sleeping aid.     Immunization History  Administered Date(s) Administered   Influenza Split 11/24/2008   Influenza, High Dose Seasonal PF 03/16/2014, 01/14/2016, 01/21/2017, 02/24/2022   Influenza,inj,Quad PF,6+ Mos 11/20/2010, 02/01/2013   Influenza-Unspecified 11/20/2010, 02/01/2013, 02/19/2018, 02/24/2022   Moderna Sars-Covid-2 Vaccination 04/23/2019, 05/21/2019, 02/20/2020   Pneumococcal Conjugate-13 03/16/2014   Pneumococcal Polysaccharide-23 04/28/2005   Td 04/28/2005   Td (Adult) 04/28/2005   Tdap 06/08/2017   Zoster Recombinant(Shingrix) 01/14/2019   Zoster, Live 07/21/2007   Pertinent  Health Maintenance Due  Topic Date Due   OPHTHALMOLOGY EXAM  Never done   HEMOGLOBIN A1C  04/15/2023   FOOT EXAM  02/20/2024   DEXA SCAN  Completed   INFLUENZA VACCINE  Discontinued      04/12/2019    2:21 PM 07/19/2019    2:20 PM 09/28/2019    8:51 AM 06/21/2020    5:59 PM 09/23/2021    1:10 PM  Fall Risk  Falls in the past year? 0 0     (RETIRED) Patient Fall Risk Level Low fall risk Low fall risk Moderate fall risk Low fall  risk Low fall risk  Fall risk Follow up Falls evaluation completed Falls evaluation completed      Functional Status Survey:    Vitals:   03/02/23 1036  BP: 125/82  Pulse: 62  Resp: 20  Temp: (!) 97 F (36.1 C)  SpO2: 97%  Weight: 127 lb 11.2 oz (57.9 kg)   Body mass index is 20.61 kg/m. Physical Exam Vitals and nursing note reviewed.  Constitutional:      Appearance: Normal appearance.  HENT:     Head: Normocephalic and atraumatic.     Nose: Nose normal.     Mouth/Throat:     Mouth: Mucous membranes are moist.  Eyes:     Extraocular Movements: Extraocular movements intact.     Conjunctiva/sclera: Conjunctivae normal.     Pupils: Pupils are equal, round, and reactive to light.  Cardiovascular:     Rate and Rhythm: Normal rate and regular rhythm.     Heart sounds: No murmur heard. Pulmonary:     Effort: Pulmonary  effort is normal.     Breath sounds: Rales present. No wheezing Frank rhonchi.     Comments: Bibasilar rales.  Abdominal:     Palpations: Abdomen is soft.     Tenderness: There is no abdominal tenderness. There is no right CVA tenderness, guarding Frank rebound.  Musculoskeletal:        General: Tenderness present. No swelling Frank deformity.     Cervical back: Normal range of motion and neck supple.     Right lower leg: No edema.     Left lower leg: Edema present.     Comments: R+L shoulder limited overhead ROM, mild R shoulder pain. S/p R+L shoulder replacement. Left ankle trace edema.   Skin:    General: Skin is warm and dry.  Neurological:     General: No focal deficit present.     Mental Status: She is alert and oriented to person, place, and time. Mental status is at baseline.     Gait: Gait abnormal.  Psychiatric:        Mood and Affect: Mood normal.        Behavior: Behavior normal.        Thought Content: Thought content normal.        Judgment: Judgment normal.     Labs reviewed: Recent Labs    01/06/23 0926 01/07/23 0425 01/08/23 0413  01/16/23 0000 01/19/23 0000 01/26/23 0718 01/29/23 0000  NA 134* 134* 135   < > 137 135 136*  K 3.6 3.6 3.1*   < > 3.4* 4.1 3.8  CL 106 106 106   < > 105 109 105  CO2 22 19* 22  --  23* 21* 24*  GLUCOSE 115* 89 95  --   --  98  --   BUN 14 13 13    < > 8 9 8   CREATININE 0.62 0.65 0.61   < > 0.5 0.95 0.8  CALCIUM 9.9 9.2 9.1   < > 8.4* 8.8* 9.6  MG 1.9 2.2 2.4  --   --   --   --   PHOS 2.0* 2.6 2.2*  --   --   --   --    < > = values in this interval not displayed.   Recent Labs    10/13/22 0738 12/24/22 0347 12/28/22 1256 01/01/23 0416 01/08/23 0413 01/16/23 0000 01/19/23 0000  AST 14*  --  17  --  17 11* 10*  ALT 10  --  14  --  11 7 3*  ALKPHOS 56  --  85  --  64 72 79  BILITOT 1.2  --  0.8  --  0.6  --   --   PROT 5.1*  --  6.5  --  5.6*  --   --   ALBUMIN 2.6*   < > 3.0*   < > 2.7* 2.5* 2.6*   < > = values in this interval not displayed.   Recent Labs    01/07/23 0425 01/08/23 0413 01/08/23 0413 01/16/23 0000 01/19/23 0000 01/26/23 0718 01/29/23 0000  WBC 6.4 8.8   < > 5.5 6.7 6.0 7.4  NEUTROABS  --  6.5  --  3,641.00 4,730.00 3.9  --   HGB 8.8* 9.2*  --  8.1* 8.0* 7.9* 9.2*  HCT 29.3* 29.3*  --  25* 24* 26.1* 29*  MCV 99.3 96.7  --   --   --  94.6  --   PLT 231 254  --  315 324 263 346   < > = values in this interval not displayed.   Lab Results  Component Value Date   TSH 1.499 12/28/2022   Lab Results  Component Value Date   HGBA1C 6.0 (H) 10/13/2022   Lab Results  Component Value Date   CHOL 121 10/13/2022   HDL 44 10/13/2022   LDLCALC 62 10/13/2022   TRIG 74 10/13/2022   CHOLHDL 2.8 10/13/2022    Significant Diagnostic Results in last 30 days:  No results found.  Assessment/Plan: Gastroesophageal reflux disease  worsened GERD, c/o vomited x1 yesterday, better today.  Continue Pantoprazole bid, Famotidine bid Adding Carafate 10ml qid x 1 wk Hold Fe x 1 wk Update CBC/diff, CMP/eGFR May consider Reglan if no better.   IDA (iron  deficiency anemia) Hgb 9.2 01/29/23, added Vit B12(Vit B12 273 12/10/22) and Fe 01/15/23.  Hold Fe x 1wk 2/2 c/o worsened GERD symptoms.   Atrial fibrillation (HCC) Heart rate is in control, on Xarelto  Essential hypertension Blood pressure is controlled,  on Hydralazine,  prn Clonidine, Bun/creat 8/0.81 01/29/23  Prediabetes  Hgb A1c 6.0 10/13/22, TSH 1.293 10/12/22, ACR 242(<30)02/26/23    Family/ staff Communication: plan of care reviewed with the patient and charge nurse  Labs/tests ordered:  CBC/diff, CMP/eGFR  Time spend 30 minutes.

## 2023-03-03 DIAGNOSIS — I10 Essential (primary) hypertension: Secondary | ICD-10-CM | POA: Diagnosis not present

## 2023-03-03 DIAGNOSIS — N179 Acute kidney failure, unspecified: Secondary | ICD-10-CM | POA: Diagnosis not present

## 2023-03-03 DIAGNOSIS — M6281 Muscle weakness (generalized): Secondary | ICD-10-CM | POA: Diagnosis not present

## 2023-03-03 DIAGNOSIS — R2681 Unsteadiness on feet: Secondary | ICD-10-CM | POA: Diagnosis not present

## 2023-03-03 LAB — BASIC METABOLIC PANEL
BUN: 17 (ref 4–21)
CO2: 23 — AB (ref 13–22)
Chloride: 104 (ref 99–108)
Creatinine: 0.8 (ref 0.5–1.1)
Glucose: 90
Potassium: 3.6 meq/L (ref 3.5–5.1)
Sodium: 137 (ref 137–147)

## 2023-03-03 LAB — HEPATIC FUNCTION PANEL
ALT: 5 U/L — AB (ref 7–35)
AST: 12 — AB (ref 13–35)
Alkaline Phosphatase: 76 (ref 25–125)
Bilirubin, Total: 0.4

## 2023-03-03 LAB — CBC AND DIFFERENTIAL
HCT: 31 — AB (ref 36–46)
Hemoglobin: 10 — AB (ref 12.0–16.0)
Neutrophils Absolute: 3474
Platelets: 258 10*3/uL (ref 150–400)
WBC: 5.2

## 2023-03-03 LAB — COMPREHENSIVE METABOLIC PANEL
Albumin: 3.2 — AB (ref 3.5–5.0)
Globulin: 2.7
eGFR: 77

## 2023-03-03 LAB — CBC: RBC: 3.5 — AB (ref 3.87–5.11)

## 2023-03-05 DIAGNOSIS — R2681 Unsteadiness on feet: Secondary | ICD-10-CM | POA: Diagnosis not present

## 2023-03-05 DIAGNOSIS — M6281 Muscle weakness (generalized): Secondary | ICD-10-CM | POA: Diagnosis not present

## 2023-03-06 ENCOUNTER — Non-Acute Institutional Stay (SKILLED_NURSING_FACILITY): Payer: Self-pay | Admitting: Sports Medicine

## 2023-03-06 ENCOUNTER — Encounter: Payer: Self-pay | Admitting: Sports Medicine

## 2023-03-06 DIAGNOSIS — M6281 Muscle weakness (generalized): Secondary | ICD-10-CM | POA: Diagnosis not present

## 2023-03-06 DIAGNOSIS — K5900 Constipation, unspecified: Secondary | ICD-10-CM | POA: Diagnosis not present

## 2023-03-06 DIAGNOSIS — K219 Gastro-esophageal reflux disease without esophagitis: Secondary | ICD-10-CM | POA: Diagnosis not present

## 2023-03-06 DIAGNOSIS — R2681 Unsteadiness on feet: Secondary | ICD-10-CM | POA: Diagnosis not present

## 2023-03-06 DIAGNOSIS — H9203 Otalgia, bilateral: Secondary | ICD-10-CM

## 2023-03-06 DIAGNOSIS — R0981 Nasal congestion: Secondary | ICD-10-CM

## 2023-03-06 NOTE — Progress Notes (Signed)
Location:  [facility] Room Number: N061-A Place of Service:  SNF [id number]) Provider:  Venita Sheffield, MD    Patient Care Team: Sigmund Hazel, MD as PCP - General (Family Medicine) Tessa Lerner, DO as PCP - Cardiology (Cardiology) Suzi Roots as Physician Assistant (Dermatology)  Extended Emergency Contact Information Primary Emergency Contact: Tina Frank, [place] [address] [address] [place] Home Phone: [phone] Work Phone: [phone] Mobile Phone: [phone] Relation: Son Secondary Emergency Contact: Pittman,Tina Frank  [place] Phone: [phone] Relation: Son  Code Status:  DNR Goals of care: Advanced Directive information    03/06/2023    1:26 PM  Advanced Directives  Does Patient Have a Medical Advance Directive? Yes  Type of Advance Directive Out of facility DNR (pink MOST or yellow form);Healthcare Power of [place];Living will  Does patient want to make changes to medical advance directive? No - Patient declined  Copy of Healthcare Power of Attorney in Chart? Yes - validated most recent copy scanned in chart (See row information)  Would patient like information on creating a medical advance directive? No - Patient declined  Pre-existing out of facility DNR order (yellow form or pink MOST form) Yellow form placed in chart (order not valid for inpatient use)     Chief Complaint  Patient presents with   Acute Visit    Vomiting     HPI:  Pt is a 82 y.o. female seen today for an acute visit for  ear discomfort Pt seen and examined in her room She is watching TV  Pt reports that she is hearing loud noises in both her ears. Staff reported that pt had complaint of clogged ears so they gave her debrox ear drops Pt denies pain  C/o nasal congestion, denies pain  Denies runny nose, sore throat , cough, SOB, abdominal pain, nausea, vomiting   Constipation - pt c/o constipation  Last  BM 3 days ago   GERD - c/o acid reflux Reports that she saw GI in the past  Currently on protonix, famotidine  Past Medical History:  Diagnosis Date   CKD (chronic kidney disease), stage II    nephrologist-- dr Kathrene Bongo  [facility])   Diabetes mellitus type 2, diet-controlled (HCC)    followed by pcp---   (09-20-2019  per pt does not check blood sugar at home)   Fracture of humeral shaft, right, closed 04/25/2022   GERD (gastroesophageal reflux disease)    History of primary hyperparathyroidism    s/p  parathyroidectomy right inferior on 12-02-2012   Humerus fracture 04/25/2022   Hyperlipidemia    Hypertension    followd by nephrologist----  (09-20-2019  per pt had stress test done some time ago , unsure when/ where, thinks told ok)   OA (osteoarthritis)    Osteoporosis    Prosthetic joint infection (HCC)    followed by dr j. hatcher (ID)   left total shoulder arthroplasty 12/ 2016  ; 12/ 2020  revision with explant joint replacement and hemiarthroplasty;  completed 6 month antibiotic 05/ 2021   Vertigo    Past Surgical History:  Procedure Laterality Date   COLONOSCOPY     DILATATION & CURETTAGE/HYSTEROSCOPY WITH MYOSURE N/A 09/28/2019   Procedure: DILATATION & CURETTAGE/HYSTEROSCOPY WITH MYOSURE;  Surgeon: Olivia Mackie, MD;  Location: [facility];  Service: Gynecology;  Laterality: N/A;   EYE SURGERY  yrs ago   laser surgery for retinal tear.  (  unilateral , pt unsure which side)   ORIF HUMERUS FRACTURE Right 04/27/2022   Procedure: OPEN REDUCTION INTERNAL FIXATION (ORIF) PROXIMAL HUMERUS FRACTURE;  Surgeon: Bjorn Pippin, MD;  Location: [facility];  Service: Orthopedics;  Laterality: Right;   PARATHYROIDECTOMY N/A 12/02/2012   Procedure: PARATHYROIDECTOMY with frozen section ;  Surgeon: Velora Heckler, MD;  Location: [facility];  Service: General;  Laterality: N/A;   REVERSE SHOULDER ARTHROPLASTY Right 04/27/2022   Procedure: REVERSE SHOULDER ARTHROPLASTY;   Surgeon: Bjorn Pippin, MD;  Location: [facility];  Service: Orthopedics;  Laterality: Right;   SHOULDER INJECTION Left 07/27/2013   Procedure: SHOULDER INJECTION;  Surgeon: Loreta Ave, MD;  Location: [facility] OR;  Service: Orthopedics;  Laterality: Left;   STERIOD INJECTION Right 02/14/2015   Procedure: RIGHT SHOULDER CORTISONE INJECTION;  Surgeon: Loreta Ave, MD;  Location: [facility] OR;  Service: Orthopedics;  Laterality: Right;   TOTAL HIP ARTHROPLASTY Left 07/27/2013   Procedure: TOTAL HIP ARTHROPLASTY ANTERIOR APPROACH;  Surgeon: Loreta Ave, MD;  Location: [facility] OR;  Service: Orthopedics;  Laterality: Left;   TOTAL SHOULDER ARTHROPLASTY Left 02/14/2015   Procedure: LEFT TOTAL SHOULDER ARTHROPLASTY;  Surgeon: Loreta Ave, MD;  Location: [facility] OR;  Service: Orthopedics;  Laterality: Left;   TOTAL SHOULDER REVISION Left 02/09/2019   Procedure: TOTAL SHOULDER REVISION;  Surgeon: Bjorn Pippin, MD;  Location: [facility];  Service: Orthopedics;  Laterality: Left;    Allergies  Allergen Reactions   Abaloparatide Other (See Comments)    "Burred vision, lightheaded" (patient does not recall this in 2024)   Mobic [Meloxicam] Hypertension   Crestor [Rosuvastatin] Other (See Comments)    Muscle aches   Norvasc [Amlodipine Besylate] Swelling and Other (See Comments)    Ankle swelling   Simvastatin Other (See Comments)    Muscle aches, high LFTs   Sulfa Antibiotics Rash   Zetia [Ezetimibe] Other (See Comments)    Muscle aches    Outpatient Encounter Medications as of 03/06/2023  Medication Sig   acetaminophen (TYLENOL) 325 MG tablet Take 650 mg by mouth every 4 (four) hours as needed for moderate pain (pain score 4-6).   allopurinol (ZYLOPRIM) 300 MG tablet Take 300 mg by mouth daily.   cloNIDine (CATAPRES) 0.1 MG tablet Take 0.1 mg by mouth daily as needed (for hypertension- if systolic b/p is over 160).   cyanocobalamin 1000 MCG tablet Take 1,000 mcg by mouth daily.   famotidine (PEPCID) 20 MG tablet  Take 20 mg by mouth 2 (two) times daily.   feeding supplement (ENSURE ENLIVE / ENSURE PLUS) LIQD Take 237 mLs by mouth 2 (two) times daily between meals.   ferrous sulfate 325 (65 FE) MG tablet Take 325 mg by mouth See admin instructions. Take 325 mg by mouth by mouth once daily on Monday, Wednesday, and Friday   hydrALAZINE (APRESOLINE) 100 MG tablet Take 1 tablet (100 mg total) by mouth 3 (three) times daily.   magnesium oxide (MAG-OX) 400 (240 Mg) MG tablet Take 1 tablet (400 mg total) by mouth daily.   meclizine (ANTIVERT) 12.5 MG tablet Take 1 tablet (12.5 mg total) by mouth 3 (three) times daily as needed for dizziness.   pantoprazole (PROTONIX) 40 MG tablet Take 1 tablet (40 mg total) by mouth 2 (two) times daily before a meal. Take 40 mg by mouth in the morning and at 5 PM- BEFORE FOOD   Rivaroxaban (XARELTO) 15 MG TABS tablet Take 1 tablet (15 mg total) by mouth daily  with supper.   tamsulosin (FLOMAX) 0.4 MG CAPS capsule Take 1 capsule (0.4 mg total) by mouth daily.   Zinc Oxide 10 % OINT Apply 1 Application topically See admin instructions. 1 application to buttocks every shift for redness   aspirin EC 81 MG tablet Take 1 tablet (81 mg total) by mouth daily. Swallow whole. (Patient not taking: Reported on 03/06/2023)   carbamide peroxide (DEBROX) 6.5 % OTIC solution Place 5 drops into both ears daily. (Patient not taking: Reported on 03/06/2023)   folic acid (FOLVITE) 1 MG tablet Take 1 mg by mouth daily. (Patient not taking: Reported on 03/06/2023)   polyethylene glycol (MIRALAX / GLYCOLAX) 17 g packet Take 17 g by mouth 2 (two) times daily. (Patient not taking: Reported on 03/06/2023)   senna-docusate (SENOKOT-S) 8.6-50 MG tablet Take 1 tablet by mouth 2 (two) times daily. (Patient not taking: Reported on 03/06/2023)   No facility-administered encounter medications on file as of 03/06/2023.    Review of Systems  Immunization History  Administered Date(s) Administered   Influenza  Split 11/24/2008   Influenza, High Dose Seasonal PF 03/16/2014, 01/14/2016, 01/21/2017, 02/24/2022   Influenza,inj,Quad PF,6+ Mos 11/20/2010, 02/01/2013   Influenza-Unspecified 11/20/2010, 02/01/2013, 02/19/2018, 02/24/2022   Moderna Sars-Covid-2 Vaccination 04/23/2019, 05/21/2019, 02/20/2020   Pneumococcal Conjugate-13 03/16/2014   Pneumococcal Polysaccharide-23 04/28/2005   Td 04/28/2005   Td (Adult) 04/28/2005   Tdap 06/08/2017   Zoster Recombinant(Shingrix) 01/14/2019   Zoster, Live 07/21/2007   Pertinent  Health Maintenance Due  Topic Date Due   OPHTHALMOLOGY EXAM  Never done   HEMOGLOBIN A1C  04/15/2023   FOOT EXAM  02/20/2024   DEXA SCAN  Completed   INFLUENZA VACCINE  Discontinued      04/12/2019    2:21 PM 07/19/2019    2:20 PM 09/28/2019    8:51 AM 06/21/2020    5:59 PM 09/23/2021    1:10 PM  Fall Risk  Falls in the past year? 0 0     (RETIRED) Patient Fall Risk Level Low fall risk Low fall risk Moderate fall risk Low fall risk Low fall risk  Fall risk Follow up Falls evaluation completed Falls evaluation completed      Functional Status Survey:    Vitals:   03/06/23 1315  BP: 130/75  Pulse: 78  Resp: 20  Temp: (!) 97 F (36.1 C)  SpO2: 97%  Weight: 122 lb 9.6 oz (55.6 kg)  Height: 5\' 5"  (1.651 m)   Body mass index is 20.4 kg/m. Physical Exam Constitutional:      Appearance: Normal appearance.  HENT:     Head: Normocephalic and atraumatic.     Right Ear: There is impacted cerumen.     Left Ear: There is impacted cerumen.     Mouth/Throat:     Comments: Mild left maxillary tenderness Cardiovascular:     Rate and Rhythm: Normal rate and regular rhythm.  Pulmonary:     Effort: Pulmonary effort is normal. No respiratory distress.     Breath sounds: Normal breath sounds. No wheezing.  Abdominal:     General: Bowel sounds are normal. There is no distension.     Tenderness: There is no abdominal tenderness. There is no guarding or rebound.     Comments:     Musculoskeletal:        General: No swelling or tenderness.  Neurological:     Mental Status: She is alert. Mental status is at baseline.     Labs reviewed: Recent Labs  01/06/23 0926 01/07/23 0425 01/08/23 0413 01/16/23 0000 01/19/23 0000 01/26/23 0718 01/29/23 0000  NA 134* 134* 135   < > 137 135 136*  K 3.6 3.6 3.1*   < > 3.4* 4.1 3.8  CL 106 106 106   < > 105 109 105  CO2 22 19* 22  --  23* 21* 24*  GLUCOSE 115* 89 95  --   --  98  --   BUN 14 13 13    < > 8 9 8   CREATININE 0.62 0.65 0.61   < > 0.5 0.95 0.8  CALCIUM 9.9 9.2 9.1   < > 8.4* 8.8* 9.6  MG 1.9 2.2 2.4  --   --   --   --   PHOS 2.0* 2.6 2.2*  --   --   --   --    < > = values in this interval not displayed.   Recent Labs    10/13/22 0738 12/24/22 0347 12/28/22 1256 01/01/23 0416 01/08/23 0413 01/16/23 0000 01/19/23 0000  AST 14*  --  17  --  17 11* 10*  ALT 10  --  14  --  11 7 3*  ALKPHOS 56  --  85  --  64 72 79  BILITOT 1.2  --  0.8  --  0.6  --   --   PROT 5.1*  --  6.5  --  5.6*  --   --   ALBUMIN 2.6*   < > 3.0*   < > 2.7* 2.5* 2.6*   < > = values in this interval not displayed.   Recent Labs    01/07/23 0425 01/08/23 0413 01/08/23 0413 01/16/23 0000 01/19/23 0000 01/26/23 0718 01/29/23 0000  WBC 6.4 8.8   < > 5.5 6.7 6.0 7.4  NEUTROABS  --  6.5  --  3,641.00 4,730.00 3.9  --   HGB 8.8* 9.2*  --  8.1* 8.0* 7.9* 9.2*  HCT 29.3* 29.3*  --  25* 24* 26.1* 29*  MCV 99.3 96.7  --   --   --  94.6  --   PLT 231 254  --  315 324 263 346   < > = values in this interval not displayed.   Lab Results  Component Value Date   TSH 1.499 12/28/2022   Lab Results  Component Value Date   HGBA1C 6.0 (H) 10/13/2022   Lab Results  Component Value Date   CHOL 121 10/13/2022   HDL 44 10/13/2022   LDLCALC 62 10/13/2022   TRIG 74 10/13/2022   CHOLHDL 2.8 10/13/2022    Significant Diagnostic Results in last 30 days:  No results found.  Assessment/Plan  1. Gastroesophageal reflux  disease, unspecified whether esophagitis present (Primary) Will start carafate 1 gm tid Cont with protonix, famotidine Will refer to GI  2. Discomfort of both ears Instructed nurse to give ear irrigation  3. Constipation, unspecified constipation type Will start colace  Increase water and fiber intake  4. Nasal congestion Will start flonase nasal spray   Other orders - famotidine (PEPCID) 20 MG tablet; Take 20 mg by mouth 2 (two) times daily. - cyanocobalamin 1000 MCG tablet; Take 1,000 mcg by mouth daily.    Care plan discussed with the nursing staff  30 min Total time spent for obtaining history,  performing a medically appropriate examination and evaluation, reviewing the tests,ordering  tests,  documenting clinical information in the electronic or other health record, independently interpreting results ,care  coordination (not separately reported)

## 2023-03-07 DIAGNOSIS — R2681 Unsteadiness on feet: Secondary | ICD-10-CM | POA: Diagnosis not present

## 2023-03-07 DIAGNOSIS — M6281 Muscle weakness (generalized): Secondary | ICD-10-CM | POA: Diagnosis not present

## 2023-03-09 DIAGNOSIS — R2681 Unsteadiness on feet: Secondary | ICD-10-CM | POA: Diagnosis not present

## 2023-03-09 DIAGNOSIS — M6281 Muscle weakness (generalized): Secondary | ICD-10-CM | POA: Diagnosis not present

## 2023-03-11 DIAGNOSIS — M6281 Muscle weakness (generalized): Secondary | ICD-10-CM | POA: Diagnosis not present

## 2023-03-11 DIAGNOSIS — R278 Other lack of coordination: Secondary | ICD-10-CM | POA: Diagnosis not present

## 2023-03-11 DIAGNOSIS — R2681 Unsteadiness on feet: Secondary | ICD-10-CM | POA: Diagnosis not present

## 2023-03-12 ENCOUNTER — Encounter: Payer: Self-pay | Admitting: Cardiology

## 2023-03-12 ENCOUNTER — Ambulatory Visit: Payer: Medicare PPO | Attending: Cardiology | Admitting: Cardiology

## 2023-03-12 VITALS — BP 142/60 | HR 74 | Resp 16 | Ht 65.0 in | Wt 127.0 lb

## 2023-03-12 DIAGNOSIS — D6869 Other thrombophilia: Secondary | ICD-10-CM | POA: Diagnosis not present

## 2023-03-12 DIAGNOSIS — I480 Paroxysmal atrial fibrillation: Secondary | ICD-10-CM

## 2023-03-12 DIAGNOSIS — E782 Mixed hyperlipidemia: Secondary | ICD-10-CM | POA: Diagnosis not present

## 2023-03-12 DIAGNOSIS — M6281 Muscle weakness (generalized): Secondary | ICD-10-CM | POA: Diagnosis not present

## 2023-03-12 DIAGNOSIS — Z7901 Long term (current) use of anticoagulants: Secondary | ICD-10-CM

## 2023-03-12 DIAGNOSIS — R001 Bradycardia, unspecified: Secondary | ICD-10-CM

## 2023-03-12 DIAGNOSIS — Z87891 Personal history of nicotine dependence: Secondary | ICD-10-CM | POA: Diagnosis not present

## 2023-03-12 DIAGNOSIS — I10 Essential (primary) hypertension: Secondary | ICD-10-CM | POA: Diagnosis not present

## 2023-03-12 DIAGNOSIS — R2681 Unsteadiness on feet: Secondary | ICD-10-CM | POA: Diagnosis not present

## 2023-03-12 DIAGNOSIS — R278 Other lack of coordination: Secondary | ICD-10-CM | POA: Diagnosis not present

## 2023-03-12 NOTE — Progress Notes (Signed)
 Cardiology Office Note:  .   Date:  03/12/2023  ID:  Tina Frank, DOB 1940/11/08, MRN [id number] PCP:  Cleotilde Planas, MD  Former Cardiology Providers: None Nephrologist: Dr. [facility] Providers Cardiologist:  Madonna Large, DO , [facility] (established care 05/24/2022) Electrophysiologist:  None  Click to update primary MD,subspecialty MD or APP then REFRESH:1}    Chief Complaint  Patient presents with   Hypertension   Atrial Fibrillation   Follow-up    History of Present Illness: Tina   Tina Frank is a 83 y.o. Caucasian female whose past medical history and cardiovascular risk factors includes: Paroxysmal atrial fibrillation (October 2024), prediabetes, MGUS (monoclonal gammopathy of unknown significance) IgM kappa MGUS, renal mass versus cyst with history of hydronephrosis, hyperlipidemia, hypertension, gout, GERD history of renal cyst/adrenal mass, history of primary hyperparathyroidism, former smoker.   Referred to cardiology for slow heart rate and irregular pulse initially noted by home health care staff.  Clinically she was asymptomatic and at the last office visit recommended to undergo echocardiogram and a Zio patch to evaluate for dysrhythmias.  She presents today for 85-month follow-up visit.   In October 2024 she was hospitalized for acute encephalopathy, urinary tract infection, AKI, and new onset of atrial fibrillation.  Patient was started on Xarelto  for thromboembolic prophylaxis.  Her aspirin  was discontinued in the interim due to gum bleeding and since then this has resolved.  She remains on Xarelto  for thromboembolic prophylaxis.  Denies anginal chest pain or heart failure symptoms.  No new episodes of bleeding.  Fall precautions advised.  No family or friends accompanying her at today's office visit.  She is now residing at friend's home after her recent hospitalization.  Review of Systems: .   Review of Systems  Cardiovascular:  Negative for  chest pain, claudication, irregular heartbeat, leg swelling, near-syncope, orthopnea, palpitations, paroxysmal nocturnal dyspnea and syncope.  Respiratory:  Negative for shortness of breath.   Hematologic/Lymphatic: Negative for bleeding problem.    Studies Reviewed:   EKG: EKG Interpretation Date/Time:  Thursday March 12 2023 08:31:01 EST Ventricular Rate:  73 PR Interval:  158 QRS Duration:  76 QT Interval:  396 QTC Calculation: 436 R Axis:   1  Text Interpretation: Sinus rhythm with Premature atrial complexes Cannot rule out Anterior infarct , age undetermined When compared with ECG of 22-Dec-2022 15:47, Sinus rhythm has replaced Atrial fibrillation Confirmed by Large Madonna ([id number]) on 03/12/2023 8:35:35 AM  Echocardiogram: October 2024: LVEF 60 to 65%. Right ventricular size and function normal. No significant valvular heart disease. Estimated RAP 3 mmHg  RADIOLOGY: NA  Risk Assessment/Calculations:   Click Here to Calculate/Change CHADS2VASc Score The patient's CHADS2-VASc score is 4, indicating a 4.8% annual risk of stroke.    Labs:       Latest Ref Rng & Units 01/29/2023   12:00 AM 01/26/2023    7:18 AM 01/19/2023   12:00 AM  CBC  WBC  7.4     6.0  6.7      Hemoglobin 12.0 - 16.0 9.2     7.9  8.0      Hematocrit 36 - 46 29     26.1  24      Platelets 150 - 400 K/uL 346     263  324         This result is from an external source.       Latest Ref Rng & Units 01/29/2023   12:00 AM 01/26/2023  7:18 AM 01/19/2023   12:00 AM  BMP  Glucose 70 - 99 mg/dL  98    BUN 4 - 21 8     9  8       Creatinine 0.5 - 1.1 0.8     0.95  0.5      Sodium 137 - 147 136     135  137      Potassium 3.5 - 5.1 mEq/L 3.8     4.1  3.4      Chloride 99 - 108 105     109  105      CO2 13 - 22 24     21  23       Calcium  8.7 - 10.7 9.6     8.8  8.4         This result is from an external source.      Latest Ref Rng & Units 01/29/2023   12:00 AM 01/26/2023    7:18 AM  01/19/2023   12:00 AM  CMP  Glucose 70 - 99 mg/dL  98    BUN 4 - 21 8     9  8       Creatinine 0.5 - 1.1 0.8     0.95  0.5      Sodium 137 - 147 136     135  137      Potassium 3.5 - 5.1 mEq/L 3.8     4.1  3.4      Chloride 99 - 108 105     109  105      CO2 13 - 22 24     21  23       Calcium  8.7 - 10.7 9.6     8.8  8.4      Alkaline Phos 25 - 125   79      AST 13 - 35   10      ALT 7 - 35 U/L   3         This result is from an external source.    Lab Results  Component Value Date   CHOL 121 10/13/2022   HDL 44 10/13/2022   LDLCALC 62 10/13/2022   TRIG 74 10/13/2022   CHOLHDL 2.8 10/13/2022   No results for input(s): LIPOA in the last 8760 hours. No components found for: NTPROBNP No results for input(s): PROBNP in the last 8760 hours. Recent Labs    10/12/22 2013 12/23/22 0606 12/28/22 1256  TSH 1.293 1.196 1.499   Physical Exam:    Today's Vitals   03/12/23 0833  BP: (!) 142/60  Pulse: 74  Resp: 16  SpO2: 94%  Weight: 127 lb (57.6 kg)  Height: 5' 5 (1.651 m)   Body mass index is 21.13 kg/m. Wt Readings from Last 3 Encounters:  03/12/23 127 lb (57.6 kg)  03/06/23 122 lb 9.6 oz (55.6 kg)  03/02/23 127 lb 11.2 oz (57.9 kg)    Physical Exam  Constitutional: No distress. She appears chronically ill.  hemodynamically stable, presents in a wheelchair.  Neck: No JVD present.  Cardiovascular: Normal rate, regular rhythm, S1 normal and S2 normal. Exam reveals no gallop, no S3 and no S4.  No murmur heard. Pulmonary/Chest: Effort normal and breath sounds normal. No stridor. She has no wheezes. She has no rales.  Abdominal: Soft. Bowel sounds are normal. She exhibits no distension. There is no abdominal tenderness.  Musculoskeletal:  General: No edema.     Cervical back: Neck supple.  Neurological: She is alert and oriented to person, place, and time. She has intact cranial nerves (2-12).  Skin: Skin is warm.     Impression & Recommendation(s):   Impression:   ICD-10-CM   1. Paroxysmal atrial fibrillation (HCC)  I48.0 EKG 12-Lead    2. Long term (current) use of anticoagulants  Z79.01     3. Hypercoagulable state due to paroxysmal atrial fibrillation (HCC)  D68.69    I48.0     4. Bradycardia  R00.1     5. Benign hypertension  I10 EKG 12-Lead    6. Mixed hyperlipidemia  E78.2     7. Former smoker  Z87.891        Recommendation(s):  Paroxysmal atrial fibrillation (HCC) Long term (current) use of anticoagulants Hypercoagulable state due to paroxysmal atrial fibrillation (HCC) Rate control: N/A. Rhythm control: N/A Thromboembolic prophylaxis: Xarelto  Diagnosed in October 2024. EKG today notes sinus rhythm with PAC. Does not endorse evidence of bleeding. Recommend checking H&H every 6 months along with BMP. Risks, benefits, alternatives to anticoagulation discussed. Advised patient to alert all providers of anticoagulation therapy prior to starting a new medication or having a procedure. Emphasized importance of monitoring for signs and symptoms of bleeding (abnormal bruising, prolonged bleeding, nose bleeds, bleeding from gums, discolored urine, black tarry stools,etc) and to seek medical attention if present.  Patient is educated on fall precautions and if she is injured despite the mechanism of injury she must seek medical attention by going to the closest ER since she is on blood thinners to rule out bleeding.    Patient understands importance of this because if internal bleeding is not treated in a timely manner it can further lead to morbidity and/or mortality.   Patient voices understanding of these recommendations and provides verbal feedback.  Bradycardia Resolved  Benign hypertension Office blood pressures acceptable. Currently on hydralazine  100 mg p.o. 3 times daily. Takes clonidine  on a as needed basis. Consider scheduled dose of either ACE or ARB inhibitor for better blood pressure management and avoid  as needed use of clonidine .  Mixed hyperlipidemia Used to be on atorvastatin  10 mg p.o. daily.  Currently not on medication list and does not know why it was discontinued.   LDL 62 mg/dL as of October 13, 2022 Currently managed by primary care provider.  Orders Placed:  Orders Placed This Encounter  Procedures   EKG 12-Lead   Discussed management of at least 2 chronic comorbid conditions, reviewed discharge summary from the recent hospital in October 2024, last progress note with Dr. Alveta dated 01/30/2023, labs dated 10/13/2022, EKG ordered and independently reviewed, echo results dated 12/24/2022.  Final Medication List:   No orders of the defined types were placed in this encounter.   Medications Discontinued During This Encounter  Medication Reason   aspirin  EC 81 MG tablet Patient Preference   carbamide peroxide (DEBROX) 6.5 % OTIC solution Patient Preference   folic acid  (FOLVITE ) 1 MG tablet Patient Preference   polyethylene glycol (MIRALAX  / GLYCOLAX ) 17 g packet Change in therapy   feeding supplement (ENSURE ENLIVE / ENSURE PLUS) LIQD Patient Preference     Current Outpatient Medications:    acetaminophen  (TYLENOL ) 325 MG tablet, Take 650 mg by mouth every 4 (four) hours as needed for moderate pain (pain score 4-6)., Disp: , Rfl:    allopurinol  (ZYLOPRIM ) 300 MG tablet, Take 300 mg by mouth daily., Disp: , Rfl:  cloNIDine  (CATAPRES ) 0.1 MG tablet, Take 0.1 mg by mouth daily as needed (for hypertension- if systolic b/p is over 160)., Disp: , Rfl:    cyanocobalamin  1000 MCG tablet, Take 1,000 mcg by mouth daily., Disp: , Rfl:    famotidine  (PEPCID ) 20 MG tablet, Take 20 mg by mouth 2 (two) times daily., Disp: , Rfl:    ferrous sulfate  325 (65 FE) MG tablet, Take 325 mg by mouth See admin instructions. Take 325 mg by mouth by mouth once daily on Monday, Wednesday, and Friday, Disp: , Rfl:    hydrALAZINE  (APRESOLINE ) 100 MG tablet, Take 1 tablet (100 mg total) by mouth 3 (three)  times daily., Disp: , Rfl:    magnesium  oxide (MAG-OX) 400 (240 Mg) MG tablet, Take 1 tablet (400 mg total) by mouth daily., Disp: , Rfl:    meclizine  (ANTIVERT ) 12.5 MG tablet, Take 1 tablet (12.5 mg total) by mouth 3 (three) times daily as needed for dizziness., Disp: 20 tablet, Rfl: 0   pantoprazole  (PROTONIX ) 40 MG tablet, Take 1 tablet (40 mg total) by mouth 2 (two) times daily before a meal. Take 40 mg by mouth in the morning and at 5 PM- BEFORE FOOD, Disp: 120 tablet, Rfl: 0   Rivaroxaban  (XARELTO ) 15 MG TABS tablet, Take 1 tablet (15 mg total) by mouth daily with supper., Disp: 30 tablet, Rfl: 0   senna-docusate (SENOKOT-S) 8.6-50 MG tablet, Take 1 tablet by mouth 2 (two) times daily., Disp: , Rfl:    tamsulosin  (FLOMAX ) 0.4 MG CAPS capsule, Take 1 capsule (0.4 mg total) by mouth daily., Disp: , Rfl:    Zinc Oxide 10 % OINT, Apply 1 Application topically See admin instructions. 1 application to buttocks every shift for redness, Disp: , Rfl:   Consent:   NA  Disposition:   6 months sooner if needed. Patient may be asked to follow-up sooner based on the results of the above-mentioned testing.  Her questions and concerns were addressed to her satisfaction. She voices understanding of the recommendations provided during this encounter.    Signed, Madonna Large, DO, [facility]  [facility]  [address] [place], [place] [address] 03/12/2023 9:01 AM

## 2023-03-12 NOTE — Patient Instructions (Signed)
 Medication Instructions:  Your physician recommends that you continue on your current medications as directed. Please refer to the Current Medication list given to you today.  *If you need a refill on your cardiac medications before your next appointment, please call your pharmacy*  Lab Work: None ordered today. If you have labs (blood work) drawn today and your tests are completely normal, you will receive your results only by: MyChart Message (if you have MyChart) OR A paper copy in the mail If you have any lab test that is abnormal or we need to change your treatment, we will call you to review the results.  Testing/Procedures: None ordered today.  Follow-Up: At [facility], you and your health needs are our priority.  As part of our continuing mission to provide you with exceptional heart care, we have created designated Provider Care Teams.  These Care Teams include your primary Cardiologist (physician) and Advanced Practice Providers (APPs -  Physician Assistants and Nurse Practitioners) who all work together to provide you with the care you need, when you need it.  We recommend signing up for the patient portal called "MyChart".  Sign up information is provided on this After Visit Summary.  MyChart is used to connect with patients for Virtual Visits (Telemedicine).  Patients are able to view lab/test results, encounter notes, upcoming appointments, etc.  Non-urgent messages can be sent to your provider as well.   To learn more about what you can do with MyChart, go to [email or web address].    Your next appointment:   6 month(s)  The format for your next appointment:   In Person  Provider:   Tessa Lerner, DO {

## 2023-03-13 DIAGNOSIS — R278 Other lack of coordination: Secondary | ICD-10-CM | POA: Diagnosis not present

## 2023-03-13 DIAGNOSIS — R2681 Unsteadiness on feet: Secondary | ICD-10-CM | POA: Diagnosis not present

## 2023-03-13 DIAGNOSIS — M6281 Muscle weakness (generalized): Secondary | ICD-10-CM | POA: Diagnosis not present

## 2023-03-16 DIAGNOSIS — M6281 Muscle weakness (generalized): Secondary | ICD-10-CM | POA: Diagnosis not present

## 2023-03-16 DIAGNOSIS — R2681 Unsteadiness on feet: Secondary | ICD-10-CM | POA: Diagnosis not present

## 2023-03-16 DIAGNOSIS — R278 Other lack of coordination: Secondary | ICD-10-CM | POA: Diagnosis not present

## 2023-03-17 DIAGNOSIS — R2681 Unsteadiness on feet: Secondary | ICD-10-CM | POA: Diagnosis not present

## 2023-03-17 DIAGNOSIS — R278 Other lack of coordination: Secondary | ICD-10-CM | POA: Diagnosis not present

## 2023-03-17 DIAGNOSIS — M6281 Muscle weakness (generalized): Secondary | ICD-10-CM | POA: Diagnosis not present

## 2023-03-18 DIAGNOSIS — R2681 Unsteadiness on feet: Secondary | ICD-10-CM | POA: Diagnosis not present

## 2023-03-18 DIAGNOSIS — M6281 Muscle weakness (generalized): Secondary | ICD-10-CM | POA: Diagnosis not present

## 2023-03-18 DIAGNOSIS — R278 Other lack of coordination: Secondary | ICD-10-CM | POA: Diagnosis not present

## 2023-03-19 ENCOUNTER — Non-Acute Institutional Stay (SKILLED_NURSING_FACILITY): Payer: Self-pay | Admitting: Nurse Practitioner

## 2023-03-19 ENCOUNTER — Encounter: Payer: Self-pay | Admitting: Nurse Practitioner

## 2023-03-19 DIAGNOSIS — Z8673 Personal history of transient ischemic attack (TIA), and cerebral infarction without residual deficits: Secondary | ICD-10-CM | POA: Diagnosis not present

## 2023-03-19 DIAGNOSIS — M1712 Unilateral primary osteoarthritis, left knee: Secondary | ICD-10-CM | POA: Diagnosis not present

## 2023-03-19 DIAGNOSIS — I4891 Unspecified atrial fibrillation: Secondary | ICD-10-CM

## 2023-03-19 DIAGNOSIS — R001 Bradycardia, unspecified: Secondary | ICD-10-CM

## 2023-03-19 DIAGNOSIS — L89152 Pressure ulcer of sacral region, stage 2: Secondary | ICD-10-CM | POA: Diagnosis not present

## 2023-03-19 DIAGNOSIS — I10 Essential (primary) hypertension: Secondary | ICD-10-CM | POA: Diagnosis not present

## 2023-03-19 DIAGNOSIS — K219 Gastro-esophageal reflux disease without esophagitis: Secondary | ICD-10-CM

## 2023-03-19 DIAGNOSIS — D509 Iron deficiency anemia, unspecified: Secondary | ICD-10-CM

## 2023-03-19 DIAGNOSIS — Z66 Do not resuscitate: Secondary | ICD-10-CM | POA: Diagnosis not present

## 2023-03-19 DIAGNOSIS — R7303 Prediabetes: Secondary | ICD-10-CM

## 2023-03-19 DIAGNOSIS — I5032 Chronic diastolic (congestive) heart failure: Secondary | ICD-10-CM

## 2023-03-19 DIAGNOSIS — M109 Gout, unspecified: Secondary | ICD-10-CM

## 2023-03-19 DIAGNOSIS — M6281 Muscle weakness (generalized): Secondary | ICD-10-CM | POA: Diagnosis not present

## 2023-03-19 DIAGNOSIS — E785 Hyperlipidemia, unspecified: Secondary | ICD-10-CM

## 2023-03-19 DIAGNOSIS — Z87898 Personal history of other specified conditions: Secondary | ICD-10-CM

## 2023-03-19 DIAGNOSIS — R2681 Unsteadiness on feet: Secondary | ICD-10-CM | POA: Diagnosis not present

## 2023-03-19 DIAGNOSIS — R278 Other lack of coordination: Secondary | ICD-10-CM | POA: Diagnosis not present

## 2023-03-19 NOTE — Progress Notes (Signed)
 Location:  [facility] Room Number: N061-A Place of Service:  SNF [id number]) Provider:  Adline Lysle OLEGARIO CARROLYN Cleotilde Olam, MD  Patient Care Team: Cleotilde Olam, MD as PCP - General (Family Medicine) Michele Richardson, DO as PCP - Cardiology (Cardiology) Porter Andrez JONELLE DEVONNA as Physician Assistant (Dermatology)  Extended Emergency Contact Information Primary Emergency Contact: [place], [place], [place] [address] [place] Home Phone: [phone] Work Phone: [phone] Mobile Phone: [phone] Relation: Son Secondary Emergency Contact: Pugmire,[name]  [place] Mobile Phone: [phone] Relation: Son  Code Status:  DNR Goals of care: Advanced Directive information    03/19/2023   11:04 AM  Advanced Directives  Does Patient Have a Medical Advance Directive? Yes  Type of Advance Directive Out of facility DNR (pink MOST or yellow form);Healthcare Power of Speculator;Living will  Does patient want to make changes to medical advance directive? No - Patient declined  Copy of Healthcare Power of Attorney in Chart? Yes - validated most recent copy scanned in chart (See row information)  Would patient like information on creating a medical advance directive? No - Patient declined  Pre-existing out of facility DNR order (yellow form or pink MOST form) Yellow form placed in chart (order not valid for inpatient use)     Chief Complaint  Patient presents with   Acute Visit    Skin breakdown    HPI:  Pt is a 83 y.o. female seen today for an acute visit for skin breakdown R+L buttocks   GERD, takes Pantoprazole , Famotidine , Carafate , Voquezna              Anemia, added Vit B12(Vit B12 273 12/10/22) and Fe 01/15/23. Hgb 10/0 03/03/23             New onset Atrial Fibrillation -2D echo from 10/13/2022 with a EF of 60 to 65%, grade 1 DD and mildly dilated left atrium. -Patient with history of bradycardia arrhythmia for which she was sent to  cardiology in May 2024. -Patient felt not a great candidate for long-term anticoagulation but fall risk now is lower due to debility per son. -Patient placed on Xarelto  which is preferred medication per family. -Patient has been placed on full dose Lovenox  in anticipation of bone marrow biopsy which was done 01/06/2023.   -Resuming Rivaroxaban  01/07/2023.               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              Hydronephrosis: f/u urology for renal mass left 1.9x1.6cm             Hospitalized 12/22/22 for metabolic encephalopathy 2/2 to UTI, AKI, and hypercalcemia.              Dysphagia, f/u ST             Hospitalized 10/12/22-10/17/22 UTI, encephalopathy, acute CVA             OA R+L knee pain, f/u Ortho for Gel inj             CVA resolved difficulty word finding, off Statin, ASA, f/u stroke clinic, MRI small punctate infarct in the posteerior limb of R internal capsule, no focal weakness             Gout, stable, takes Allopurinol   HLD, off Atorvastatin , LDL 62 10/13/22             HTN, on Hydralazine ,  prn Clonidine , Bun/creat 17/0.8 03/03/23             CXR 10/14/22 pulm edema/volume overload, not apparent, off prn Furosemide , BNP 950.8 10/14/22, Echo 60-65% EF 10/13/22             Constipation, stable, diet controlled             Prediabetes Hgb A1c 6.0 10/13/22, TSH 1.293 10/12/22, ACR 242(<30)02/26/23             Positive anti CCP test, f/u Rheumatology              Bradycardia, Hx of, off Carvedilol               Hx of urinary retention,  taking Tamsulosin .   Past Medical History:  Diagnosis Date   CKD (chronic kidney disease), stage II    nephrologist-- dr prescilla  [facility])   Diabetes mellitus type 2, diet-controlled (HCC)    followed by pcp---   (09-20-2019  per pt does not check blood sugar at home)   Fracture of humeral shaft,  right, closed 04/25/2022   GERD (gastroesophageal reflux disease)    History of primary hyperparathyroidism    s/p  parathyroidectomy right inferior on 12-02-2012   Humerus fracture 04/25/2022   Hyperlipidemia    Hypertension    followd by nephrologist----  (09-20-2019  per pt had stress test done some time ago , unsure when/ where, thinks told ok)   OA (osteoarthritis)    Osteoporosis    Prosthetic joint infection (HCC)    followed by dr j. hatcher (ID)   left total shoulder arthroplasty 12/ 2016  ; 12/ 2020  revision with explant joint replacement and hemiarthroplasty;  completed 6 month antibiotic 05/ 2021   Vertigo    Past Surgical History:  Procedure Laterality Date   COLONOSCOPY     DILATATION & CURETTAGE/HYSTEROSCOPY WITH MYOSURE N/A 09/28/2019   Procedure: DILATATION & CURETTAGE/HYSTEROSCOPY WITH MYOSURE;  Surgeon: Gorge Ade, MD;  Location: [facility];  Service: Gynecology;  Laterality: N/A;   EYE SURGERY  yrs ago   laser surgery for retinal tear.  (unilateral , pt unsure which side)   ORIF HUMERUS FRACTURE Right 04/27/2022   Procedure: OPEN REDUCTION INTERNAL FIXATION (ORIF) PROXIMAL HUMERUS FRACTURE;  Surgeon: Cristy Bonner DASEN, MD;  Location: [facility];  Service: Orthopedics;  Laterality: Right;   PARATHYROIDECTOMY N/A 12/02/2012   Procedure: PARATHYROIDECTOMY with frozen section ;  Surgeon: Krystal CHRISTELLA Spinner, MD;  Location: [facility];  Service: General;  Laterality: N/A;   REVERSE SHOULDER ARTHROPLASTY Right 04/27/2022   Procedure: REVERSE SHOULDER ARTHROPLASTY;  Surgeon: Cristy Bonner DASEN, MD;  Location: [facility];  Service: Orthopedics;  Laterality: Right;   SHOULDER INJECTION Left 07/27/2013   Procedure: SHOULDER INJECTION;  Surgeon: Toribio JULIANNA Chancy, MD;  Location: [facility] OR;  Service: Orthopedics;  Laterality: Left;   STERIOD INJECTION Right 02/14/2015   Procedure: RIGHT SHOULDER CORTISONE INJECTION;  Surgeon: Toribio JULIANNA Chancy, MD;  Location: [facility] OR;  Service: Orthopedics;   Laterality: Right;   TOTAL HIP ARTHROPLASTY Left 07/27/2013   Procedure: TOTAL HIP ARTHROPLASTY ANTERIOR APPROACH;  Surgeon: Toribio JULIANNA Chancy, MD;  Location[facility];  Service: Orthopedics;  Laterality: Left;   TOTAL SHOULDER ARTHROPLASTY Left 02/14/2015   Procedure: LEFT TOTAL SHOULDER ARTHROPLASTY;  Surgeon: Toribio JULIANNA Chancy, MD;  Location: [facility] OR;  Service: Orthopedics;  Laterality: Left;   TOTAL SHOULDER REVISION Left 02/09/2019   Procedure: TOTAL SHOULDER REVISION;  Surgeon: Cristy Bonner DASEN, MD;  Location: [facility];  Service: Orthopedics;  Laterality: Left;    Allergies  Allergen Reactions   Abaloparatide Other (See Comments)    Burred vision, lightheaded (patient does not recall this in 2024)   Mobic  [Meloxicam ] Hypertension   Crestor [Rosuvastatin] Other (See Comments)    Muscle aches   Norvasc [Amlodipine Besylate] Swelling and Other (See Comments)    Ankle swelling   Simvastatin Other (See Comments)    Muscle aches, high LFTs   Sulfa Antibiotics Rash   Zetia [Ezetimibe] Other (See Comments)    Muscle aches    Outpatient Encounter Medications as of 03/19/2023  Medication Sig   acetaminophen  (TYLENOL ) 325 MG tablet Take 650 mg by mouth every 4 (four) hours as needed for moderate pain (pain score 4-6).   allopurinol  (ZYLOPRIM ) 300 MG tablet Take 300 mg by mouth daily.   cloNIDine  (CATAPRES ) 0.1 MG tablet Take 0.1 mg by mouth daily as needed (for hypertension- if systolic b/p is over 160).   cyanocobalamin  1000 MCG tablet Take 1,000 mcg by mouth daily.   famotidine  (PEPCID ) 20 MG tablet Take 20 mg by mouth 2 (two) times daily.   ferrous sulfate  325 (65 FE) MG tablet Take 325 mg by mouth See admin instructions. Take 325 mg by mouth by mouth once daily on Monday, Wednesday, and Friday   hydrALAZINE  (APRESOLINE ) 100 MG tablet Take 1 tablet (100 mg total) by mouth 3 (three) times daily.   magnesium  oxide (MAG-OX) 400 (240 Mg) MG tablet Take 1 tablet (400 mg total) by mouth daily.   meclizine   (ANTIVERT ) 12.5 MG tablet Take 1 tablet (12.5 mg total) by mouth 3 (three) times daily as needed for dizziness.   pantoprazole  (PROTONIX ) 40 MG tablet Take 1 tablet (40 mg total) by mouth 2 (two) times daily before a meal. Take 40 mg by mouth in the morning and at 5 PM- BEFORE FOOD   Rivaroxaban  (XARELTO ) 15 MG TABS tablet Take 1 tablet (15 mg total) by mouth daily with supper.   senna-docusate (SENOKOT-S) 8.6-50 MG tablet Take 1 tablet by mouth 2 (two) times daily.   tamsulosin  (FLOMAX ) 0.4 MG CAPS capsule Take 1 capsule (0.4 mg total) by mouth daily.   Zinc Oxide 10 % OINT Apply 1 Application topically See admin instructions. 1 application to buttocks every shift for redness   No facility-administered encounter medications on file as of 03/19/2023.    Review of Systems  Constitutional:  Negative for appetite change, fatigue and fever.  HENT:  Negative for congestion, mouth sores and trouble swallowing.   Eyes:  Negative for visual disturbance.  Respiratory:  Negative for cough and shortness of breath.   Cardiovascular:  Negative for leg swelling.  Gastrointestinal:  Negative for abdominal pain and constipation.       GERD symptoms intermittently  Genitourinary:  Negative for dysuria and urgency.       Nocturnal urination 2x average.   Musculoskeletal:  Positive for arthralgias and gait problem.       R+L knee pain, R+L shoulder limited overhead ROM, c/o pain in the R shoulder, mild.   Skin:  Positive for wound.  Neurological:  Negative for speech difficulty, weakness and headaches.  Psychiatric/Behavioral:  Positive for sleep disturbance. Negative for confusion. The patient is not nervous/anxious.        Sometimes not sleeping, declined sleeping  aid.     Immunization History  Administered Date(s) Administered   Influenza Split 11/24/2008   Influenza, High Dose Seasonal PF 03/16/2014, 01/14/2016, 01/21/2017, 02/24/2022   Influenza,inj,Quad PF,6+ Mos 11/20/2010, 02/01/2013    Influenza-Unspecified 11/20/2010, 02/01/2013, 02/19/2018, 02/24/2022   Moderna Sars-Covid-2 Vaccination 04/23/2019, 05/21/2019, 02/20/2020   Pneumococcal Conjugate-13 03/16/2014   Pneumococcal Polysaccharide-23 04/28/2005   Td 04/28/2005   Td (Adult) 04/28/2005   Tdap 06/08/2017   Zoster Recombinant(Shingrix) 01/14/2019   Zoster, Live 07/21/2007   Pertinent  Health Maintenance Due  Topic Date Due   OPHTHALMOLOGY EXAM  Never done   HEMOGLOBIN A1C  04/15/2023   FOOT EXAM  02/20/2024   DEXA SCAN  Completed   INFLUENZA VACCINE  Discontinued      04/12/2019    2:21 PM 07/19/2019    2:20 PM 09/28/2019    8:51 AM 06/21/2020    5:59 PM 09/23/2021    1:10 PM  Fall Risk  Falls in the past year? 0 0     (RETIRED) Patient Fall Risk Level Low fall risk Low fall risk Moderate fall risk Low fall risk Low fall risk  Fall risk Follow up Falls evaluation completed Falls evaluation completed      Functional Status Survey:    Vitals:   03/17/23 1218 03/19/23 1102  BP: (!) 122/51 (!) 122/51  Pulse:  77  Resp:  18  Temp:  98.4 F (36.9 C)  SpO2:  97%  Weight:  127 lb 3.2 oz (57.7 kg)  Height:  5' 5 (1.651 m)   Body mass index is 21.17 kg/m. Physical Exam Vitals and nursing note reviewed.  Constitutional:      Appearance: Normal appearance.  HENT:     Head: Normocephalic and atraumatic.     Nose: Nose normal.     Mouth/Throat:     Mouth: Mucous membranes are moist.  Eyes:     Extraocular Movements: Extraocular movements intact.     Conjunctiva/sclera: Conjunctivae normal.     Pupils: Pupils are equal, round, and reactive to light.  Cardiovascular:     Rate and Rhythm: Normal rate and regular rhythm.     Heart sounds: No murmur heard. Pulmonary:     Effort: Pulmonary effort is normal.     Breath sounds: Rales present. No wheezing or rhonchi.     Comments: Bibasilar rales.  Abdominal:     Palpations: Abdomen is soft.     Tenderness: There is no abdominal tenderness. There is no  right CVA tenderness, guarding or rebound.  Musculoskeletal:        General: Tenderness present. No swelling or deformity.     Cervical back: Normal range of motion and neck supple.     Right lower leg: No edema.     Left lower leg: Edema present.     Comments: R+L shoulder limited overhead ROM, mild R shoulder pain. S/p R+L shoulder replacement. Left ankle trace edema.   Skin:    General: Skin is warm and dry.     Comments: skin breakdown R+L buttocks, a dime sized open area left buttock, suspected the cause of bleeding reported, no active bleeding or s/s of infection, noted non blanchable redness R+L buttocks   Neurological:     General: No focal deficit present.     Mental Status: She is alert and oriented to person, place, and time. Mental status is at baseline.     Gait: Gait abnormal.  Psychiatric:        Mood  and Affect: Mood normal.        Behavior: Behavior normal.        Thought Content: Thought content normal.        Judgment: Judgment normal.     Labs reviewed: Recent Labs    01/06/23 0926 01/07/23 0425 01/08/23 0413 01/16/23 0000 01/19/23 0000 01/26/23 0718 01/29/23 0000 03/03/23 0000  NA 134* 134* 135   < > 137 135 136* 137  K 3.6 3.6 3.1*   < > 3.4* 4.1 3.8 3.6  CL 106 106 106   < > 105 109 105 104  CO2 22 19* 22  --  23* 21* 24* 23*  GLUCOSE 115* 89 95  --   --  98  --   --   BUN 14 13 13    < > 8 9 8 17   CREATININE 0.62 0.65 0.61   < > 0.5 0.95 0.8 0.8  CALCIUM  9.9 9.2 9.1   < > 8.4* 8.8* 9.6  --   MG 1.9 2.2 2.4  --   --   --   --   --   PHOS 2.0* 2.6 2.2*  --   --   --   --   --    < > = values in this interval not displayed.   Recent Labs    10/13/22 0738 12/24/22 0347 12/28/22 1256 01/01/23 0416 01/08/23 0413 01/16/23 0000 01/19/23 0000 03/03/23 0000  AST 14*  --  17  --  17 11* 10* 12*  ALT 10  --  14  --  11 7 3* 5*  ALKPHOS 56  --  85  --  64 72 79 76  BILITOT 1.2  --  0.8  --  0.6  --   --   --   PROT 5.1*  --  6.5  --  5.6*  --   --    --   ALBUMIN  2.6*   < > 3.0*   < > 2.7* 2.5* 2.6* 3.2*   < > = values in this interval not displayed.   Recent Labs    01/07/23 0425 01/08/23 0413 01/16/23 0000 01/19/23 0000 01/26/23 0718 01/29/23 0000 03/03/23 0000  WBC 6.4 8.8   < > 6.7 6.0 7.4 5.2  NEUTROABS  --  6.5   < > 4,730.00 3.9  --  3,474.00  HGB 8.8* 9.2*   < > 8.0* 7.9* 9.2* 10.0*  HCT 29.3* 29.3*   < > 24* 26.1* 29* 31*  MCV 99.3 96.7  --   --  94.6  --   --   PLT 231 254   < > 324 263 346 258   < > = values in this interval not displayed.   Lab Results  Component Value Date   TSH 1.499 12/28/2022   Lab Results  Component Value Date   HGBA1C 6.0 (H) 10/13/2022   Lab Results  Component Value Date   CHOL 121 10/13/2022   HDL 44 10/13/2022   LDLCALC 62 10/13/2022   TRIG 74 10/13/2022   CHOLHDL 2.8 10/13/2022    Significant Diagnostic Results in last 30 days:  No results found.  Assessment/Plan Decubitus ulcer limited to breakdown of skin (stage 2) (HCC) skin breakdown R+L buttocks, a dime sized open area left buttock, suspected the cause of bleeding reported, no active bleeding or s/s of infection, noted non blanchable redness R+L buttocks Pressure reduction, barrier ointment for now.   Essential hypertension Fluctuating Bp, asymptomatic,  on Hydralazine ,  prn Clonidine , Bun/creat 17/0.8 03/03/23, monitor Bp daily.   Gastroesophageal reflux disease takes Pantoprazole , Famotidine , Carafate , Voquezna  IDA (iron deficiency anemia) added Vit B12(Vit B12 273 12/10/22) and Fe 01/15/23. Hgb 10/0 03/03/23  Atrial fibrillation (HCC) Heart rate is in control, resumed Eliquis now  Osteoarthritis f/u Ortho for Gel inj  History of stroke resolved difficulty word finding, off Statin, ASA, f/u stroke clinic, MRI small punctate infarct in the posteerior limb of R internal capsule, no focal weakness  Gout stable, takes Allopurinol   Hyperlipidemia  off Atorvastatin , LDL 62 10/13/22  Chronic heart failure  with preserved ejection fraction (HFpEF) (HCC) Compensated, CXR 10/14/22 pulm edema/volume overload, not apparent, off prn Furosemide , BNP 950.8 10/14/22, Echo 60-65% EF 10/13/22  Prediabetes Hgb A1c 6.0 10/13/22, TSH 1.293 10/12/22, ACR 242(<30)02/26/23  Bradycardia Hx of, off Carvedilol , resolved.     Family/ staff Communication: plan of care reviewed with the patient and charge nurse.   Labs/tests ordered:  none  Time spend 30 minutes.

## 2023-03-20 ENCOUNTER — Other Ambulatory Visit: Payer: Self-pay | Admitting: Gastroenterology

## 2023-03-20 DIAGNOSIS — K2100 Gastro-esophageal reflux disease with esophagitis, without bleeding: Secondary | ICD-10-CM

## 2023-03-20 DIAGNOSIS — R2681 Unsteadiness on feet: Secondary | ICD-10-CM | POA: Diagnosis not present

## 2023-03-20 DIAGNOSIS — R278 Other lack of coordination: Secondary | ICD-10-CM | POA: Diagnosis not present

## 2023-03-20 DIAGNOSIS — M6281 Muscle weakness (generalized): Secondary | ICD-10-CM | POA: Diagnosis not present

## 2023-03-20 NOTE — Assessment & Plan Note (Signed)
 skin breakdown R+L buttocks, a dime sized open area left buttock, suspected the cause of bleeding reported, no active bleeding or s/s of infection, noted non blanchable redness R+L buttocks Pressure reduction, barrier ointment for now.

## 2023-03-23 ENCOUNTER — Encounter: Payer: Self-pay | Admitting: Nurse Practitioner

## 2023-03-23 DIAGNOSIS — R2681 Unsteadiness on feet: Secondary | ICD-10-CM | POA: Diagnosis not present

## 2023-03-23 DIAGNOSIS — M6281 Muscle weakness (generalized): Secondary | ICD-10-CM | POA: Diagnosis not present

## 2023-03-23 DIAGNOSIS — R278 Other lack of coordination: Secondary | ICD-10-CM | POA: Diagnosis not present

## 2023-03-23 NOTE — Assessment & Plan Note (Signed)
 Hgb A1c 6.0 10/13/22, TSH 1.293 10/12/22, ACR 242(<30)02/26/23

## 2023-03-23 NOTE — Assessment & Plan Note (Signed)
 Fluctuating Bp, asymptomatic,  on Hydralazine,  prn Clonidine, Bun/creat 17/0.8 03/03/23, monitor Bp daily.

## 2023-03-23 NOTE — Assessment & Plan Note (Signed)
 stable, takes Allopurinol.

## 2023-03-23 NOTE — Assessment & Plan Note (Signed)
 added Vit B12(Vit B12 273 12/10/22) and Fe 01/15/23. Hgb 10/0 03/03/23

## 2023-03-23 NOTE — Assessment & Plan Note (Signed)
 resolved difficulty word finding, off Statin, ASA, f/u stroke clinic, MRI small punctate infarct in the posteerior limb of R internal capsule, no focal weakness

## 2023-03-23 NOTE — Assessment & Plan Note (Signed)
 Compensated, CXR 10/14/22 pulm edema/volume overload, not apparent, off prn Furosemide, BNP 950.8 10/14/22, Echo 60-65% EF 10/13/22

## 2023-03-23 NOTE — Assessment & Plan Note (Signed)
 off Atorvastatin, LDL 62 10/13/22

## 2023-03-23 NOTE — Assessment & Plan Note (Signed)
 Hx of, off Carvedilol, resolved.

## 2023-03-23 NOTE — Assessment & Plan Note (Signed)
 f/u Ortho for Gel inj

## 2023-03-23 NOTE — Assessment & Plan Note (Signed)
 Heart rate is in control, resumed Eliquis now

## 2023-03-23 NOTE — Assessment & Plan Note (Signed)
 takes Pantoprazole, Famotidine, Carafate, Voquezna

## 2023-03-23 NOTE — Assessment & Plan Note (Signed)
 Resolved,   taking Tamsulosin.

## 2023-03-24 ENCOUNTER — Encounter: Payer: Self-pay | Admitting: Nurse Practitioner

## 2023-03-24 ENCOUNTER — Non-Acute Institutional Stay (SKILLED_NURSING_FACILITY): Payer: Self-pay | Admitting: Nurse Practitioner

## 2023-03-24 DIAGNOSIS — Z8673 Personal history of transient ischemic attack (TIA), and cerebral infarction without residual deficits: Secondary | ICD-10-CM

## 2023-03-24 DIAGNOSIS — Z87898 Personal history of other specified conditions: Secondary | ICD-10-CM | POA: Diagnosis not present

## 2023-03-24 DIAGNOSIS — I4891 Unspecified atrial fibrillation: Secondary | ICD-10-CM

## 2023-03-24 DIAGNOSIS — E785 Hyperlipidemia, unspecified: Secondary | ICD-10-CM | POA: Diagnosis not present

## 2023-03-24 DIAGNOSIS — M109 Gout, unspecified: Secondary | ICD-10-CM | POA: Diagnosis not present

## 2023-03-24 DIAGNOSIS — M79672 Pain in left foot: Secondary | ICD-10-CM | POA: Diagnosis not present

## 2023-03-24 DIAGNOSIS — B351 Tinea unguium: Secondary | ICD-10-CM | POA: Diagnosis not present

## 2023-03-24 DIAGNOSIS — K5901 Slow transit constipation: Secondary | ICD-10-CM

## 2023-03-24 DIAGNOSIS — R001 Bradycardia, unspecified: Secondary | ICD-10-CM

## 2023-03-24 DIAGNOSIS — D509 Iron deficiency anemia, unspecified: Secondary | ICD-10-CM

## 2023-03-24 DIAGNOSIS — M6281 Muscle weakness (generalized): Secondary | ICD-10-CM | POA: Diagnosis not present

## 2023-03-24 DIAGNOSIS — I10 Essential (primary) hypertension: Secondary | ICD-10-CM | POA: Diagnosis not present

## 2023-03-24 DIAGNOSIS — I5032 Chronic diastolic (congestive) heart failure: Secondary | ICD-10-CM | POA: Diagnosis not present

## 2023-03-24 DIAGNOSIS — R278 Other lack of coordination: Secondary | ICD-10-CM | POA: Diagnosis not present

## 2023-03-24 DIAGNOSIS — L84 Corns and callosities: Secondary | ICD-10-CM | POA: Diagnosis not present

## 2023-03-24 DIAGNOSIS — M79671 Pain in right foot: Secondary | ICD-10-CM | POA: Diagnosis not present

## 2023-03-24 DIAGNOSIS — K219 Gastro-esophageal reflux disease without esophagitis: Secondary | ICD-10-CM

## 2023-03-24 DIAGNOSIS — M1712 Unilateral primary osteoarthritis, left knee: Secondary | ICD-10-CM

## 2023-03-24 DIAGNOSIS — R2681 Unsteadiness on feet: Secondary | ICD-10-CM | POA: Diagnosis not present

## 2023-03-24 DIAGNOSIS — M2042 Other hammer toe(s) (acquired), left foot: Secondary | ICD-10-CM | POA: Diagnosis not present

## 2023-03-24 DIAGNOSIS — R7303 Prediabetes: Secondary | ICD-10-CM

## 2023-03-24 DIAGNOSIS — M2041 Other hammer toe(s) (acquired), right foot: Secondary | ICD-10-CM | POA: Diagnosis not present

## 2023-03-24 DIAGNOSIS — E1159 Type 2 diabetes mellitus with other circulatory complications: Secondary | ICD-10-CM | POA: Diagnosis not present

## 2023-03-24 NOTE — Assessment & Plan Note (Signed)
 Compensated clinically, CXR 10/14/22 pulm edema/volume overload, not apparent, off prn Furosemide, BNP 950.8 10/14/22, Echo 60-65% EF 10/13/22

## 2023-03-24 NOTE — Assessment & Plan Note (Signed)
 Fluctuating Bps, on Hydralazine,  prn Clonidine, Bun/creat 17/0.8 03/03/23

## 2023-03-24 NOTE — Progress Notes (Signed)
 Location:   [facility] Room Number: 47 Place of Service:  SNF (31)  Provider: Larwance Hark NP  PCP: Cleotilde Planas, MD Patient Care Team: Cleotilde Planas, MD as PCP - General (Family Medicine) Michele Richardson, DO as PCP - Cardiology (Cardiology) Porter Andrez JONELLE DEVONNA as Physician Assistant (Dermatology)  Extended Emergency Contact Information Primary Emergency Contact: Veronica, [place], [place] [address] [place] Home Phone: [phone] Work Phone: [phone] Mobile Phone: [phone] Relation: Son Secondary Emergency Contact: Spillman,[name]  [place] Phone: [phone] Relation: Son  Code Status: DNR Goals of care:  Advanced Directive information    03/19/2023   11:04 AM  Advanced Directives  Does Patient Have a Medical Advance Directive? Yes  Type of Advance Directive Out of facility DNR (pink MOST or yellow form);Healthcare Power of [place];Living will  Does patient want to make changes to medical advance directive? No - Patient declined  Copy of Healthcare Power of Attorney in Chart? Yes - validated most recent copy scanned in chart (See row information)  Would patient like information on creating a medical advance directive? No - Patient declined  Pre-existing out of facility DNR order (yellow form or pink MOST form) Yellow form placed in chart (order not valid for inpatient use)     Allergies  Allergen Reactions   Abaloparatide Other (See Comments)    Burred vision, lightheaded (patient does not recall this in 2024)   Mobic  [Meloxicam ] Hypertension   Crestor [Rosuvastatin] Other (See Comments)    Muscle aches   Norvasc [Amlodipine Besylate] Swelling and Other (See Comments)    Ankle swelling   Simvastatin Other (See Comments)    Muscle aches, high LFTs   Sulfa Antibiotics Rash   Zetia [Ezetimibe] Other (See Comments)    Muscle aches    Chief Complaint  Patient presents with   Acute Visit     Discharge to [facility]    HPI:  83 y.o. female with medical history significant of GERD, Anemia, Afib, hx of CVA, HTN, OA, and gout admitted to SN[facility] for therapy  The patient has regained physical strength, ADL function, stable to transition to [facility] for care.   Her [facility] stay was complicated with re-hospitalization 12/22/22-01/08/23 due to UTI, acute encephalopathy and ED evaluation for oral bleeding.   Left buttock scabbed over pressure area, healing.   GERD, takes Pantoprazole , Famotidine , Carafate , Voquezna              Anemia, added Vit B12(Vit B12 273 12/10/22) and Fe 01/15/23. Hgb 10/0 03/03/23             New onset Atrial Fibrillation -2D echo from 10/13/2022 with a EF of 60 to 65%, grade 1 DD and mildly dilated left atrium. -Patient with history of bradycardia arrhythmia for which she was sent to cardiology in May 2024. -Patient felt not a great candidate for long-term anticoagulation but fall risk now is lower due to debility per son. -Patient placed on Xarelto  which is preferred medication per family. -Patient has been placed on full dose Lovenox  in anticipation of bone marrow biopsy which was done 01/06/2023.   -Resuming Rivaroxaban  01/07/2023.               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  Hydronephrosis: f/u urology for renal mass left 1.9x1.6cm             Hospitalized 12/22/22 for metabolic encephalopathy 2/2 to UTI, AKI, and hypercalcemia.              Dysphagia, f/u ST             Hospitalized 10/12/22-10/17/22 UTI, encephalopathy, acute CVA             OA R+L knee pain, f/u Ortho for Gel inj             CVA resolved difficulty word finding, off Statin, ASA, f/u stroke clinic, MRI small punctate infarct in the posteerior limb of R internal capsule, no focal weakness             Gout, stable, takes Allopurinol              HLD, off  Atorvastatin , LDL 62 10/13/22             HTN, on Hydralazine ,  prn Clonidine , Bun/creat 17/0.8 03/03/23             CXR 10/14/22 pulm edema/volume overload, not apparent, off prn Furosemide , BNP 950.8 10/14/22, Echo 60-65% EF 10/13/22             Constipation, stable, diet controlled             Prediabetes Hgb A1c 6.0 10/13/22, TSH 1.293 10/12/22, ACR 242(<30)02/26/23             Positive anti CCP test, f/u Rheumatology              Bradycardia, Hx of, off Carvedilol               Hx of urinary retention, c/o urinary frequency and urgency, denied dysuria, lower abd and back pain,  taking Tamsulosin .      Past Medical History:  Diagnosis Date   CKD (chronic kidney disease), stage II    nephrologist-- dr prescilla  [facility])   Diabetes mellitus type 2, diet-controlled (HCC)    followed by pcp---   (09-20-2019  per pt does not check blood sugar at home)   Fracture of humeral shaft, right, closed 04/25/2022   GERD (gastroesophageal reflux disease)    History of primary hyperparathyroidism    s/p  parathyroidectomy right inferior on 12-02-2012   Humerus fracture 04/25/2022   Hyperlipidemia    Hypertension    followd by nephrologist----  (09-20-2019  per pt had stress test done some time ago , unsure when/ where, thinks told ok)   OA (osteoarthritis)    Osteoporosis    Prosthetic joint infection (HCC)    followed by dr j. hatcher (ID)   left total shoulder arthroplasty 12/ 2016  ; 12/ 2020  revision with explant joint replacement and hemiarthroplasty;  completed 6 month antibiotic 05/ 2021   Vertigo     Past Surgical History:  Procedure Laterality Date   COLONOSCOPY     DILATATION & CURETTAGE/HYSTEROSCOPY WITH MYOSURE N/A 09/28/2019   Procedure: DILATATION & CURETTAGE/HYSTEROSCOPY WITH MYOSURE;  Surgeon: Gorge Ade, MD;  Location: [facility] ;  Service: Gynecology;  Laterality: N/A;   EYE SURGERY  yrs ago   laser surgery for retinal tear.  (unilateral , pt unsure  which side)   ORIF HUMERUS FRACTURE Right 04/27/2022   Procedure: OPEN REDUCTION INTERNAL FIXATION (ORIF) PROXIMAL HUMERUS FRACTURE;  Surgeon: Cristy Bonner DASEN, MD;  Location: [facility];  Service: Orthopedics;  Laterality: Right;  PARATHYROIDECTOMY N/A 12/02/2012   Procedure: PARATHYROIDECTOMY with frozen section ;  Surgeon: Krystal CHRISTELLA Spinner, MD;  Location: [facility];  Service: General;  Laterality: N/A;   REVERSE SHOULDER ARTHROPLASTY Right 04/27/2022   Procedure: REVERSE SHOULDER ARTHROPLASTY;  Surgeon: Cristy Bonner DASEN, MD;  Location: [facility];  Service: Orthopedics;  Laterality: Right;   SHOULDER INJECTION Left 07/27/2013   Procedure: SHOULDER INJECTION;  Surgeon: Toribio JULIANNA Chancy, MD;  Location: [facility] OR;  Service: Orthopedics;  Laterality: Left;   STERIOD INJECTION Right 02/14/2015   Procedure: RIGHT SHOULDER CORTISONE INJECTION;  Surgeon: Toribio JULIANNA Chancy, MD;  Location: [facility] OR;  Service: Orthopedics;  Laterality: Right;   TOTAL HIP ARTHROPLASTY Left 07/27/2013   Procedure: TOTAL HIP ARTHROPLASTY ANTERIOR APPROACH;  Surgeon: Toribio JULIANNA Chancy, MD;  Location: [facility] OR;  Service: Orthopedics;  Laterality: Left;   TOTAL SHOULDER ARTHROPLASTY Left 02/14/2015   Procedure: LEFT TOTAL SHOULDER ARTHROPLASTY;  Surgeon: Toribio JULIANNA Chancy, MD;  Location: [facility] OR;  Service: Orthopedics;  Laterality: Left;   TOTAL SHOULDER REVISION Left 02/09/2019   Procedure: TOTAL SHOULDER REVISION;  Surgeon: Cristy Bonner DASEN, MD;  Location: [facility];  Service: Orthopedics;  Laterality: Left;      reports that she quit smoking about 35 years ago. Her smoking use included cigarettes. She started smoking about 55 years ago. She has a 20 pack-year smoking history. She has never used smokeless tobacco. She reports that she does not drink alcohol and does not use drugs. Social History   Socioeconomic History   Marital status: Widowed    Spouse name: Not on file   Number of children: Not on file   Years of education: Not on file   Highest education level: Not  on file  Occupational History   Not on file  Tobacco Use   Smoking status: Former    Current packs/day: 0.00    Average packs/day: 1 pack/day for 20.0 years (20.0 ttl pk-yrs)    Types: Cigarettes    Start date: 10/28/1967    Quit date: 10/28/1987    Years since quitting: 35.4   Smokeless tobacco: Never  Vaping Use   Vaping status: Never Used  Substance and Sexual Activity   Alcohol use: No   Drug use: Never   Sexual activity: Yes    Birth control/protection: Post-menopausal  Other Topics Concern   Not on file  Social History Narrative   Not on file   Social Drivers of Health   Financial Resource Strain: Not on file  Food Insecurity: No Food Insecurity (12/22/2022)   Hunger Vital Sign    Worried About Running Out of Food in the Last Year: Never true    Ran Out of Food in the Last Year: Never true  Transportation Needs: No Transportation Needs (12/22/2022)   PRAPARE - Administrator, Civil Service (Medical): No    Lack of Transportation (Non-Medical): No  Physical Activity: Not on file  Stress: Not on file  Social Connections: Not on file  Intimate Partner Violence: Not At Risk (12/22/2022)   Humiliation, Afraid, Rape, and Kick questionnaire    Fear of Current or Ex-Partner: No    Emotionally Abused: No    Physically Abused: No    Sexually Abused: No   Functional Status Survey:    Allergies  Allergen Reactions   Abaloparatide Other (See Comments)    Burred vision, lightheaded (patient does not recall this in 2024)   Mobic  [Meloxicam ] Hypertension   Crestor [Rosuvastatin] Other (See Comments)  Muscle aches   Norvasc [Amlodipine Besylate] Swelling and Other (See Comments)    Ankle swelling   Simvastatin Other (See Comments)    Muscle aches, high LFTs   Sulfa Antibiotics Rash   Zetia [Ezetimibe] Other (See Comments)    Muscle aches    Pertinent  Health Maintenance Due  Topic Date Due   OPHTHALMOLOGY EXAM  Never done   HEMOGLOBIN A1C   04/15/2023   FOOT EXAM  02/20/2024   DEXA SCAN  Completed   INFLUENZA VACCINE  Discontinued    Medications: Allergies as of 03/24/2023       Reactions   Abaloparatide Other (See Comments)   Burred vision, lightheaded (patient does not recall this in 2024)   Mobic  [meloxicam ] Hypertension   Crestor [rosuvastatin] Other (See Comments)   Muscle aches   Norvasc [amlodipine Besylate] Swelling, Other (See Comments)   Ankle swelling   Simvastatin Other (See Comments)   Muscle aches, high LFTs   Sulfa Antibiotics Rash   Zetia [ezetimibe] Other (See Comments)   Muscle aches        Medication List        Accurate as of March 24, 2023  4:30 PM. If you have any questions, ask your nurse or doctor.          acetaminophen  325 MG tablet Commonly known as: TYLENOL  Take 650 mg by mouth every 4 (four) hours as needed for moderate pain (pain score 4-6).   allopurinol  300 MG tablet Commonly known as: ZYLOPRIM  Take 300 mg by mouth daily.   cloNIDine  0.1 MG tablet Commonly known as: CATAPRES  Take 0.1 mg by mouth daily as needed (for hypertension- if systolic b/p is over 160).   cyanocobalamin  1000 MCG tablet Take 1,000 mcg by mouth daily.   famotidine  20 MG tablet Commonly known as: PEPCID  Take 20 mg by mouth 2 (two) times daily.   ferrous sulfate  325 (65 FE) MG tablet Take 325 mg by mouth See admin instructions. Take 325 mg by mouth by mouth once daily on Monday, Wednesday, and Friday   hydrALAZINE  100 MG tablet Commonly known as: APRESOLINE  Take 1 tablet (100 mg total) by mouth 3 (three) times daily.   magnesium  oxide 400 (240 Mg) MG tablet Commonly known as: MAG-OX Take 1 tablet (400 mg total) by mouth daily.   meclizine  12.5 MG tablet Commonly known as: ANTIVERT  Take 1 tablet (12.5 mg total) by mouth 3 (three) times daily as needed for dizziness.   pantoprazole  40 MG tablet Commonly known as: PROTONIX  Take 1 tablet (40 mg total) by mouth 2 (two) times daily  before a meal. Take 40 mg by mouth in the morning and at 5 PM- BEFORE FOOD   Rivaroxaban  15 MG Tabs tablet Commonly known as: XARELTO  Take 1 tablet (15 mg total) by mouth daily with supper.   senna-docusate 8.6-50 MG tablet Commonly known as: Senokot-S Take 1 tablet by mouth 2 (two) times daily.   tamsulosin  0.4 MG Caps capsule Commonly known as: FLOMAX  Take 1 capsule (0.4 mg total) by mouth daily.   Zinc Oxide 10 % Oint Apply 1 Application topically See admin instructions. 1 application to buttocks every shift for redness        Review of Systems  Constitutional:  Negative for appetite change, fatigue and fever.  HENT:  Negative for congestion, mouth sores and trouble swallowing.   Eyes:  Negative for visual disturbance.  Respiratory:  Negative for cough and shortness of breath.   Cardiovascular:  Negative for leg swelling.  Gastrointestinal:  Negative for abdominal pain and constipation.       GERD symptoms intermittently  Genitourinary:  Positive for frequency and urgency. Negative for dysuria.       Nocturnal urination 2x average.  Now up x3   Musculoskeletal:  Positive for arthralgias and gait problem.       R+L knee pain, R+L shoulder limited overhead ROM, c/o pain in the R shoulder, mild.   Skin:  Positive for wound.  Neurological:  Negative for speech difficulty, weakness and headaches.  Psychiatric/Behavioral:  Positive for sleep disturbance. Negative for confusion. The patient is not nervous/anxious.        Sometimes not sleeping, declined sleeping aid.     Vitals:   03/24/23 1602  BP: (!) 151/59  Pulse: 82  Resp: 18  Temp: 98 F (36.7 C)  SpO2: 97%  Weight: 127 lb 3.2 oz (57.7 kg)   Body mass index is 21.17 kg/m. Physical Exam Vitals and nursing note reviewed.  Constitutional:      Appearance: Normal appearance.  HENT:     Head: Normocephalic and atraumatic.     Nose: Nose normal.     Mouth/Throat:     Mouth: Mucous membranes are moist.  Eyes:      Extraocular Movements: Extraocular movements intact.     Conjunctiva/sclera: Conjunctivae normal.     Pupils: Pupils are equal, round, and reactive to light.  Cardiovascular:     Rate and Rhythm: Normal rate and regular rhythm.     Heart sounds: No murmur heard. Pulmonary:     Effort: Pulmonary effort is normal.     Breath sounds: Rales present. No wheezing or rhonchi.     Comments: Bibasilar rales.  Abdominal:     Palpations: Abdomen is soft.     Tenderness: There is no abdominal tenderness. There is no right CVA tenderness, guarding or rebound.  Musculoskeletal:        General: Tenderness present. No swelling or deformity.     Cervical back: Normal range of motion and neck supple.     Right lower leg: No edema.     Left lower leg: Edema present.     Comments: R+L shoulder limited overhead ROM, mild R shoulder pain. S/p R+L shoulder replacement. Left ankle trace edema.   Skin:    General: Skin is warm and dry.     Comments: skin breakdown R+L buttocks, a dime sized open area left buttock, suspected the cause of bleeding reported, no active bleeding or s/s of infection, noted non blanchable redness R+L buttocks   Neurological:     General: No focal deficit present.     Mental Status: She is alert and oriented to person, place, and time. Mental status is at baseline.     Gait: Gait abnormal.  Psychiatric:        Mood and Affect: Mood normal.        Behavior: Behavior normal.        Thought Content: Thought content normal.        Judgment: Judgment normal.     Labs reviewed: Basic Metabolic Panel: Recent Labs    01/06/23 0926 01/07/23 0425 01/08/23 0413 01/16/23 0000 01/19/23 0000 01/26/23 0718 01/29/23 0000 03/03/23 0000  NA 134* 134* 135   < > 137 135 136* 137  K 3.6 3.6 3.1*   < > 3.4* 4.1 3.8 3.6  CL 106 106 106   < > 105 109 105 104  CO2 22 19* 22  --  23* 21* 24* 23*  GLUCOSE 115* 89 95  --   --  98  --   --   BUN 14 13 13    < > 8 9 8 17   CREATININE 0.62  0.65 0.61   < > 0.5 0.95 0.8 0.8  CALCIUM  9.9 9.2 9.1   < > 8.4* 8.8* 9.6  --   MG 1.9 2.2 2.4  --   --   --   --   --   PHOS 2.0* 2.6 2.2*  --   --   --   --   --    < > = values in this interval not displayed.   Liver Function Tests: Recent Labs    10/13/22 0738 12/24/22 0347 12/28/22 1256 01/01/23 0416 01/08/23 0413 01/16/23 0000 01/19/23 0000 03/03/23 0000  AST 14*  --  17  --  17 11* 10* 12*  ALT 10  --  14  --  11 7 3* 5*  ALKPHOS 56  --  85  --  64 72 79 76  BILITOT 1.2  --  0.8  --  0.6  --   --   --   PROT 5.1*  --  6.5  --  5.6*  --   --   --   ALBUMIN  2.6*   < > 3.0*   < > 2.7* 2.5* 2.6* 3.2*   < > = values in this interval not displayed.   Recent Labs    10/12/22 2013  LIPASE 26   Recent Labs    10/12/22 2104 12/28/22 1256  AMMONIA <10 <10   CBC: Recent Labs    01/07/23 0425 01/08/23 0413 01/16/23 0000 01/19/23 0000 01/26/23 0718 01/29/23 0000 03/03/23 0000  WBC 6.4 8.8   < > 6.7 6.0 7.4 5.2  NEUTROABS  --  6.5   < > 4,730.00 3.9  --  3,474.00  HGB 8.8* 9.2*   < > 8.0* 7.9* 9.2* 10.0*  HCT 29.3* 29.3*   < > 24* 26.1* 29* 31*  MCV 99.3 96.7  --   --  94.6  --   --   PLT 231 254   < > 324 263 346 258   < > = values in this interval not displayed.   Cardiac Enzymes: No results for input(s): CKTOTAL, CKMB, CKMBINDEX, TROPONINI in the last 8760 hours. BNP: Invalid input(s): POCBNP CBG: Recent Labs    01/08/23 1113 01/08/23 1547 01/08/23 2104  GLUCAP 140* 107* 120*    Procedures and Imaging Studies During Stay: No results found.  Assessment/Plan:   Essential hypertension Fluctuating Bps, on Hydralazine ,  prn Clonidine , Bun/creat 17/0.8 03/03/23  Chronic heart failure with preserved ejection fraction (HFpEF) (HCC) Compensated clinically, CXR 10/14/22 pulm edema/volume overload, not apparent, off prn Furosemide , BNP 950.8 10/14/22, Echo 60-65% EF 10/13/22  Slow transit constipation CXR 10/14/22 pulm edema/volume overload, not  apparent, off prn Furosemide , BNP 950.8 10/14/22, Echo 60-65% EF 10/13/22  Prediabetes Hgb A1c 6.0 10/13/22, TSH 1.293 10/12/22, ACR 242(<30)02/26/23  Bradycardia Hx of, off Carvedilol    History of urinary retention c/o urinary frequency and urgency, denied dysuria, lower abd and back pain,  taking Tamsulosin . UA C/S   Hyperlipidemia off Atorvastatin , LDL 62 10/13/22  Gout stable, takes Allopurinol   History of stroke resolved difficulty word finding, off Statin, ASA, f/u stroke clinic, MRI small punctate infarct in the posteerior limb of R internal capsule, no focal weakness  Osteoarthritis OA  R+L knee pain, f/u Ortho for Gel inj  Atrial fibrillation (HCC) Heart rate is in control, resumed Xarelto   IDA (iron deficiency anemia) added Vit B12(Vit B12 273 12/10/22) and Fe 01/15/23. Hgb 10/0 03/03/23  Gastroesophageal reflux disease  takes Pantoprazole , Famotidine , Carafate , Voquezna   Patient is being discharged with the following home health services:    Patient is being discharged with the following durable medical equipment:    Patient has been advised to f/u with their PCP in 1-2 weeks to bring them up to date on their rehab stay.  Social services at facility was responsible for arranging this appointment.  Pt was provided with a 30 day supply of prescriptions for medications and refills must be obtained from their PCP.  For controlled substances, a more limited supply may be provided adequate until PCP appointment only.  Future labs/tests needed:  UA C/S in am

## 2023-03-24 NOTE — Assessment & Plan Note (Signed)
 OA R+L knee pain, f/u Ortho for Gel inj

## 2023-03-24 NOTE — Assessment & Plan Note (Signed)
 Heart rate is in control, resumed Xarelto

## 2023-03-24 NOTE — Assessment & Plan Note (Signed)
 Hgb A1c 6.0 10/13/22, TSH 1.293 10/12/22, ACR 242(<30)02/26/23

## 2023-03-24 NOTE — Assessment & Plan Note (Signed)
 CXR 10/14/22 pulm edema/volume overload, not apparent, off prn Furosemide, BNP 950.8 10/14/22, Echo 60-65% EF 10/13/22

## 2023-03-24 NOTE — Assessment & Plan Note (Signed)
 off Atorvastatin, LDL 62 10/13/22

## 2023-03-24 NOTE — Assessment & Plan Note (Signed)
 takes Pantoprazole, Famotidine, Carafate, Voquezna

## 2023-03-24 NOTE — Assessment & Plan Note (Signed)
 stable, takes Allopurinol.

## 2023-03-24 NOTE — Assessment & Plan Note (Addendum)
 c/o urinary frequency and urgency, denied dysuria, lower abd and back pain,  taking Tamsulosin. UA C/S

## 2023-03-24 NOTE — Assessment & Plan Note (Signed)
 added Vit B12(Vit B12 273 12/10/22) and Fe 01/15/23. Hgb 10/0 03/03/23

## 2023-03-24 NOTE — Assessment & Plan Note (Signed)
 Hx of, off Carvedilol

## 2023-03-24 NOTE — Assessment & Plan Note (Signed)
 resolved difficulty word finding, off Statin, ASA, f/u stroke clinic, MRI small punctate infarct in the posteerior limb of R internal capsule, no focal weakness

## 2023-03-25 DIAGNOSIS — M6281 Muscle weakness (generalized): Secondary | ICD-10-CM | POA: Diagnosis not present

## 2023-03-25 DIAGNOSIS — N390 Urinary tract infection, site not specified: Secondary | ICD-10-CM | POA: Diagnosis not present

## 2023-03-25 DIAGNOSIS — R278 Other lack of coordination: Secondary | ICD-10-CM | POA: Diagnosis not present

## 2023-03-25 DIAGNOSIS — R2681 Unsteadiness on feet: Secondary | ICD-10-CM | POA: Diagnosis not present

## 2023-03-27 ENCOUNTER — Other Ambulatory Visit: Payer: Self-pay | Admitting: Nurse Practitioner

## 2023-03-27 DIAGNOSIS — R2681 Unsteadiness on feet: Secondary | ICD-10-CM | POA: Diagnosis not present

## 2023-03-27 DIAGNOSIS — R278 Other lack of coordination: Secondary | ICD-10-CM | POA: Diagnosis not present

## 2023-03-27 DIAGNOSIS — M6281 Muscle weakness (generalized): Secondary | ICD-10-CM | POA: Diagnosis not present

## 2023-03-30 DIAGNOSIS — M6281 Muscle weakness (generalized): Secondary | ICD-10-CM | POA: Diagnosis not present

## 2023-03-30 DIAGNOSIS — R2681 Unsteadiness on feet: Secondary | ICD-10-CM | POA: Diagnosis not present

## 2023-03-30 DIAGNOSIS — R278 Other lack of coordination: Secondary | ICD-10-CM | POA: Diagnosis not present

## 2023-03-31 ENCOUNTER — Non-Acute Institutional Stay: Payer: Medicare PPO | Admitting: Nurse Practitioner

## 2023-03-31 ENCOUNTER — Encounter: Payer: Self-pay | Admitting: Nurse Practitioner

## 2023-03-31 DIAGNOSIS — D509 Iron deficiency anemia, unspecified: Secondary | ICD-10-CM | POA: Diagnosis not present

## 2023-03-31 DIAGNOSIS — I5032 Chronic diastolic (congestive) heart failure: Secondary | ICD-10-CM | POA: Diagnosis not present

## 2023-03-31 DIAGNOSIS — M1712 Unilateral primary osteoarthritis, left knee: Secondary | ICD-10-CM

## 2023-03-31 DIAGNOSIS — R7303 Prediabetes: Secondary | ICD-10-CM

## 2023-03-31 DIAGNOSIS — M109 Gout, unspecified: Secondary | ICD-10-CM

## 2023-03-31 DIAGNOSIS — K5901 Slow transit constipation: Secondary | ICD-10-CM | POA: Diagnosis not present

## 2023-03-31 DIAGNOSIS — K219 Gastro-esophageal reflux disease without esophagitis: Secondary | ICD-10-CM

## 2023-03-31 DIAGNOSIS — Z8673 Personal history of transient ischemic attack (TIA), and cerebral infarction without residual deficits: Secondary | ICD-10-CM | POA: Diagnosis not present

## 2023-03-31 DIAGNOSIS — I10 Essential (primary) hypertension: Secondary | ICD-10-CM

## 2023-03-31 DIAGNOSIS — E785 Hyperlipidemia, unspecified: Secondary | ICD-10-CM | POA: Diagnosis not present

## 2023-03-31 DIAGNOSIS — R001 Bradycardia, unspecified: Secondary | ICD-10-CM

## 2023-03-31 DIAGNOSIS — Z87898 Personal history of other specified conditions: Secondary | ICD-10-CM

## 2023-03-31 NOTE — Assessment & Plan Note (Signed)
resolved difficulty word finding, off Statin, ASA, f/u stroke clinic, MRI small punctate infarct in the posteerior limb of R internal capsule, no focal weakness

## 2023-03-31 NOTE — Assessment & Plan Note (Signed)
CXR 10/14/22 pulm edema/volume overload, not apparent, off prn Furosemide, BNP 950.8 10/14/22, Echo 60-65% EF 10/13/22

## 2023-03-31 NOTE — Assessment & Plan Note (Signed)
added Vit B12(Vit B12 273 12/10/22) and Fe 01/15/23. Hgb 10/0 03/03/23

## 2023-03-31 NOTE — Progress Notes (Unsigned)
Location:   [facility] Room Number: [place] 807- A Place of Service:  ALF (13) Provider: Arna Snipe Dominic Rhome NP  Sigmund Hazel, MD  Patient Care Team: Sigmund Hazel, MD as PCP - General (Family Medicine) Tessa Lerner, DO as PCP - Cardiology (Cardiology) Glyn Ade, PA-C as Physician Assistant (Dermatology)  Extended Emergency Contact Information Primary Emergency Contact: [name], [place] [address] [place] Home Phone: [phone] Work Phone: [phone] Mobile Phone: [phone] Relation: Son Secondary Emergency Contact: Loud[place],[name]  [place] Phone: [phone] Relation: Son  Code Status: DNR Goals of care: Advanced Directive information    04/01/2023    3:01 PM  Advanced Directives  Does Patient Have a Medical Advance Directive? Yes  Type of Advance Directive Out of facility DNR (pink MOST or yellow form);Healthcare Power of [place];Living will  Does patient want to make changes to medical advance directive? No - Guardian declined  Copy of Healthcare Power of Attorney in Chart? Yes - validated most recent copy scanned in chart (See row information)  Would patient like information on creating a medical advance directive? No - Patient declined  Pre-existing out of facility DNR order (yellow form or pink MOST form) Yellow form placed in chart (order not valid for inpatient use)     Chief Complaint  Patient presents with   Medical Management of Chronic Issues    Routine visit. Discuss need for eye exam     HPI:  Pt is a 83 y.o. female seen today for managing chronic medical conditions.      GERD, takes Voquezna per GI, 03/27/23 03/27/23 GI dc'd Pantoprazole, Famotidine, Carafafe.               Anemia, added Vit B12(Vit B12 273 12/10/22) and Fe 01/15/23. Hgb 10/0 03/03/23, 01/30/23 Cardiology dc'd ASA             New onset Atrial Fibrillation -2D echo from 10/13/2022 with a EF of 60 to 65%, grade 1 DD and  mildly dilated left atrium. -Patient with history of bradycardia arrhythmia for which she was sent to cardiology in May 2024. -Patient felt not a great candidate for long-term anticoagulation but fall risk now is lower due to debility per son. -Patient placed on Xarelto which is preferred medication per family. -Patient has been placed on full dose Lovenox in anticipation of bone marrow biopsy which was done 01/06/2023.   -Resuming Rivaroxaban 01/07/2023. 03/11/22 cardiology H&H, BMP q 6months              Saw oncology 01/19/23 MGUS, negative cone marrow biopsy, moderate risk of progression to IgM myeloma or [place] macroglobulinemia(19-37% in next 20 years), f/u.              Hydronephrosis: f/u urology for renal mass left 1.9x1.6cm             Hospitalized 12/22/22 for metabolic encephalopathy 2/2 to UTI, AKI, and hypercalcemia.              Dysphagia, f/u ST             Hospitalized 10/12/22-10/17/22 UTI, encephalopathy, acute CVA             OA R+L knee pain, f/u Ortho for Gel inj, ambulates with walker.              CVA resolved difficulty word finding, off Statin, ASA, f/u stroke clinic, MRI small punctate infarct  in the posteerior limb of R internal capsule, no focal weakness             Gout, stable, takes Allopurinol             HLD, off Atorvastatin, LDL 62 10/13/22             HTN, on Hydralazine,  prn Clonidine, Bun/creat 17/0.8 03/03/23             CXR 10/14/22 pulm edema/volume overload, not apparent, off prn Furosemide, BNP 950.8 10/14/22, Echo 60-65% EF 10/13/22             Constipation, stable, diet controlled             Prediabetes Hgb A1c 6.0 10/13/22, TSH 1.293 10/12/22, ACR 242(<30)02/26/23             Positive anti CCP test, f/u Rheumatology              Bradycardia, Hx of, off Carvedilol              Hx of urinary retention, c/o urinary frequency and urgency, denied dysuria, lower abd and back pain,  taking Tamsulosin.      Past Medical History:  Diagnosis Date   CKD (chronic  kidney disease), stage II    nephrologist-- dr Kathrene Bongo  [facility])   Diabetes mellitus type 2, diet-controlled (HCC)    followed by pcp---   (09-20-2019  per pt does not check blood sugar at home)   Fracture of humeral shaft, right, closed 04/25/2022   GERD (gastroesophageal reflux disease)    History of primary hyperparathyroidism    s/p  parathyroidectomy right inferior on 12-02-2012   Humerus fracture 04/25/2022   Hyperlipidemia    Hypertension    followd by nephrologist----  (09-20-2019  per pt had stress test done some time ago , unsure when/ where, thinks told ok)   OA (osteoarthritis)    Osteoporosis    Prosthetic joint infection (HCC)    followed by dr j. hatcher (ID)   left total shoulder arthroplasty 12/ 2016  ; 12/ 2020  revision with explant joint replacement and hemiarthroplasty;  completed 6 month antibiotic 05/ 2021   Vertigo    Past Surgical History:  Procedure Laterality Date   COLONOSCOPY     DILATATION & CURETTAGE/HYSTEROSCOPY WITH MYOSURE N/A 09/28/2019   Procedure: DILATATION & CURETTAGE/HYSTEROSCOPY WITH MYOSURE;  Surgeon: Olivia Mackie, MD;  Location: [facility] [place];  Service: Gynecology;  Laterality: N/A;   EYE SURGERY  yrs ago   laser surgery for retinal tear.  (unilateral , pt unsure which side)   ORIF HUMERUS FRACTURE Right 04/27/2022   Procedure: OPEN REDUCTION INTERNAL FIXATION (ORIF) PROXIMAL HUMERUS FRACTURE;  Surgeon: Bjorn Pippin, MD;  Location: [facility];  Service: Orthopedics;  Laterality: Right;   PARATHYROIDECTOMY N/A 12/02/2012   Procedure: PARATHYROIDECTOMY with frozen section ;  Surgeon: Velora Heckler, MD;  Location: [facility];  Service: General;  Laterality: N/A;   REVERSE SHOULDER ARTHROPLASTY Right 04/27/2022   Procedure: REVERSE SHOULDER ARTHROPLASTY;  Surgeon: Bjorn Pippin, MD;  Location: [facility];  Service: Orthopedics;  Laterality: Right;   SHOULDER INJECTION Left 07/27/2013   Procedure: SHOULDER INJECTION;  Surgeon: Loreta Ave, MD;  Location: [facility];  Service: Orthopedics;  Laterality: Left;   STERIOD INJECTION Right 02/14/2015   Procedure: RIGHT SHOULDER CORTISONE INJECTION;  Surgeon: Loreta Ave, MD;  Location: [facility] OR;  Service: Orthopedics;  Laterality: Right;  TOTAL HIP ARTHROPLASTY Left 07/27/2013   Procedure: TOTAL HIP ARTHROPLASTY ANTERIOR APPROACH;  Surgeon: Loreta Ave, MD;  Location: [facility] OR;  Service: Orthopedics;  Laterality: Left;   TOTAL SHOULDER ARTHROPLASTY Left 02/14/2015   Procedure: LEFT TOTAL SHOULDER ARTHROPLASTY;  Surgeon: Loreta Ave, MD;  Location: [facility] OR;  Service: Orthopedics;  Laterality: Left;   TOTAL SHOULDER REVISION Left 02/09/2019   Procedure: TOTAL SHOULDER REVISION;  Surgeon: Bjorn Pippin, MD;  Location: [facility];  Service: Orthopedics;  Laterality: Left;    Allergies  Allergen Reactions   Abaloparatide Other (See Comments)    "Burred vision, lightheaded" (patient does not recall this in 2024)   Mobic [Meloxicam] Hypertension   Crestor [Rosuvastatin] Other (See Comments)    Muscle aches   Norvasc [Amlodipine Besylate] Swelling and Other (See Comments)    Ankle swelling   Simvastatin Other (See Comments)    Muscle aches, high LFTs   Sulfa Antibiotics Rash   Zetia [Ezetimibe] Other (See Comments)    Muscle aches    Allergies as of 03/31/2023       Reactions   Abaloparatide Other (See Comments)   "Burred vision, lightheaded" (patient does not recall this in 2024)   Mobic [meloxicam] Hypertension   Crestor [rosuvastatin] Other (See Comments)   Muscle aches   Norvasc [amlodipine Besylate] Swelling, Other (See Comments)   Ankle swelling   Simvastatin Other (See Comments)   Muscle aches, high LFTs   Sulfa Antibiotics Rash   Zetia [ezetimibe] Other (See Comments)   Muscle aches        Medication List        Accurate as of March 31, 2023 11:59 PM. If you have any questions, ask your nurse or doctor.          acetaminophen 325 MG tablet Commonly  known as: TYLENOL Take 650 mg by mouth every 4 (four) hours as needed for moderate pain (pain score 4-6).   allopurinol 300 MG tablet Commonly known as: ZYLOPRIM Take 300 mg by mouth daily.   cloNIDine 0.1 MG tablet Commonly known as: CATAPRES Take 0.1 mg by mouth daily as needed (for hypertension- if systolic b/p is over 160).   cyanocobalamin 1000 MCG tablet Take 1,000 mcg by mouth daily.   docusate sodium 100 MG capsule Commonly known as: COLACE Take 100 mg by mouth 2 (two) times daily.   ferrous sulfate 325 (65 FE) MG tablet Take 325 mg by mouth See admin instructions. Take 325 mg by mouth by mouth once daily on Monday, Wednesday, and Friday   hydrALAZINE 100 MG tablet Commonly known as: APRESOLINE Take 1 tablet (100 mg total) by mouth 3 (three) times daily.   magnesium oxide 400 (240 Mg) MG tablet Commonly known as: MAG-OX Take 1 tablet (400 mg total) by mouth daily.   meclizine 12.5 MG tablet Commonly known as: ANTIVERT Take 1 tablet (12.5 mg total) by mouth 3 (three) times daily as needed for dizziness.   Rivaroxaban 15 MG Tabs tablet Commonly known as: XARELTO Take 1 tablet (15 mg total) by mouth daily with supper.   senna-docusate 8.6-50 MG tablet Commonly known as: Senokot-S Take 1 tablet by mouth 2 (two) times daily.   tamsulosin 0.4 MG Caps capsule Commonly known as: FLOMAX Take 1 capsule (0.4 mg total) by mouth daily.   Voquezna 20 MG Tabs Generic drug: Vonoprazan Fumarate Take 1 tablet by mouth daily.   Zinc Oxide 10 % Oint Apply 1 Application topically See admin instructions.  1 application to buttocks every shift for redness        Review of Systems  Constitutional:  Negative for appetite change, fatigue and fever.  HENT:  Negative for congestion, mouth sores and trouble swallowing.   Eyes:  Negative for visual disturbance.  Respiratory:  Negative for cough and shortness of breath.   Cardiovascular:  Positive for leg swelling.   Gastrointestinal:  Negative for abdominal pain and constipation.       GERD symptoms intermittently  Genitourinary:  Positive for frequency and urgency. Negative for dysuria.       Nocturnal urination 2x average.  Now up x3   Musculoskeletal:  Positive for arthralgias and gait problem.       R+L knee pain, R+L shoulder limited overhead ROM, c/o pain in the R shoulder, mild.   Skin:  Positive for wound.  Neurological:  Negative for speech difficulty, weakness and headaches.  Psychiatric/Behavioral:  Positive for sleep disturbance. Negative for confusion. The patient is not nervous/anxious.        Sometimes not sleeping, declined sleeping aid.     Immunization History  Administered Date(s) Administered   Influenza Split 11/24/2008   Influenza, High Dose Seasonal PF 03/16/2014, 01/14/2016, 01/21/2017, 02/24/2022   Influenza,inj,Quad PF,6+ Mos 11/20/2010, 02/01/2013   Influenza-Unspecified 11/20/2010, 02/01/2013, 02/19/2018, 02/24/2022   Moderna Sars-Covid-2 Vaccination 04/23/2019, 05/21/2019, 02/20/2020   Pneumococcal Conjugate-13 03/16/2014   Pneumococcal Polysaccharide-23 04/28/2005   Td 04/28/2005   Td (Adult) 04/28/2005   Tdap 06/08/2017   Zoster Recombinant(Shingrix) 01/14/2019   Zoster, Live 07/21/2007   Pertinent  Health Maintenance Due  Topic Date Due   OPHTHALMOLOGY EXAM  Never done   HEMOGLOBIN A1C  04/15/2023   FOOT EXAM  02/20/2024   DEXA SCAN  Completed   INFLUENZA VACCINE  Discontinued      04/12/2019    2:21 PM 07/19/2019    2:20 PM 09/28/2019    8:51 AM 06/21/2020    5:59 PM 09/23/2021    1:10 PM  Fall Risk  Falls in the past year? 0 0     (RETIRED) Patient Fall Risk Level Low fall risk Low fall risk Moderate fall risk Low fall risk Low fall risk  Fall risk Follow up Falls evaluation completed Falls evaluation completed      Functional Status Survey:    Vitals:   03/31/23 1310  BP: 136/66  Pulse: 74  Resp: 20  Temp: 98 F (36.7 C)  SpO2: 97%    There is no height or weight on file to calculate BMI. Physical Exam Vitals and nursing note reviewed.  Constitutional:      Appearance: Normal appearance.  HENT:     Head: Normocephalic and atraumatic.     Nose: Nose normal.     Mouth/Throat:     Mouth: Mucous membranes are moist.  Eyes:     Extraocular Movements: Extraocular movements intact.     Conjunctiva/sclera: Conjunctivae normal.     Pupils: Pupils are equal, round, and reactive to light.  Cardiovascular:     Rate and Rhythm: Normal rate and regular rhythm.     Heart sounds: No murmur heard. Pulmonary:     Effort: Pulmonary effort is normal.     Breath sounds: Rales present. No wheezing or rhonchi.     Comments: Bibasilar rales.  Abdominal:     Palpations: Abdomen is soft.     Tenderness: There is no abdominal tenderness. There is no right CVA tenderness, guarding or rebound.  Musculoskeletal:  General: Tenderness present. No swelling or deformity.     Cervical back: Normal range of motion and neck supple.     Right lower leg: No edema.     Left lower leg: Edema present.     Comments: R+L shoulder limited overhead ROM, mild R shoulder pain. S/p R+L shoulder replacement. Left ankle trace edema.   Skin:    General: Skin is warm and dry.     Comments: skin breakdown R+L buttocks, a dime sized open area left buttock, suspected the cause of bleeding reported, no active bleeding or s/s of infection, noted non blanchable redness R+L buttocks   Neurological:     General: No focal deficit present.     Mental Status: She is alert and oriented to person, place, and time. Mental status is at baseline.     Gait: Gait abnormal.  Psychiatric:        Mood and Affect: Mood normal.        Behavior: Behavior normal.        Thought Content: Thought content normal.        Judgment: Judgment normal.     Labs reviewed: Recent Labs    01/06/23 0926 01/07/23 0425 01/08/23 0413 01/16/23 0000 01/19/23 0000 01/26/23 0718  01/29/23 0000 03/03/23 0000  NA 134* 134* 135   < > 137 135 136* 137  K 3.6 3.6 3.1*   < > 3.4* 4.1 3.8 3.6  CL 106 106 106   < > 105 109 105 104  CO2 22 19* 22  --  23* 21* 24* 23*  GLUCOSE 115* 89 95  --   --  98  --   --   BUN 14 13 13    < > 8 9 8 17   CREATININE 0.62 0.65 0.61   < > 0.5 0.95 0.8 0.8  CALCIUM 9.9 9.2 9.1   < > 8.4* 8.8* 9.6  --   MG 1.9 2.2 2.4  --   --   --   --   --   PHOS 2.0* 2.6 2.2*  --   --   --   --   --    < > = values in this interval not displayed.   Recent Labs    10/13/22 0738 12/24/22 0347 12/28/22 1256 01/01/23 0416 01/08/23 0413 01/16/23 0000 01/19/23 0000 03/03/23 0000  AST 14*  --  17  --  17 11* 10* 12*  ALT 10  --  14  --  11 7 3* 5*  ALKPHOS 56  --  85  --  64 72 79 76  BILITOT 1.2  --  0.8  --  0.6  --   --   --   PROT 5.1*  --  6.5  --  5.6*  --   --   --   ALBUMIN 2.6*   < > 3.0*   < > 2.7* 2.5* 2.6* 3.2*   < > = values in this interval not displayed.   Recent Labs    01/07/23 0425 01/08/23 0413 01/16/23 0000 01/19/23 0000 01/26/23 0718 01/29/23 0000 03/03/23 0000  WBC 6.4 8.8   < > 6.7 6.0 7.4 5.2  NEUTROABS  --  6.5   < > 4,730.00 3.9  --  3,474.00  HGB 8.8* 9.2*   < > 8.0* 7.9* 9.2* 10.0*  HCT 29.3* 29.3*   < > 24* 26.1* 29* 31*  MCV 99.3 96.7  --   --  94.6  --   --  PLT 231 254   < > 324 263 346 258   < > = values in this interval not displayed.   Lab Results  Component Value Date   TSH 1.499 12/28/2022   Lab Results  Component Value Date   HGBA1C 6.0 (H) 10/13/2022   Lab Results  Component Value Date   CHOL 121 10/13/2022   HDL 44 10/13/2022   LDLCALC 62 10/13/2022   TRIG 74 10/13/2022   CHOLHDL 2.8 10/13/2022    Significant Diagnostic Results in last 30 days:  No results found.  Assessment/Plan: Hyperlipidemia off Atorvastatin, listed statin allergy,  LDL 62 10/13/22, repeat lipid panel.   Essential hypertension Blood pressure is controlled, on Hydralazine,  prn Clonidine, Bun/creat 17/0.8  03/03/23  Chronic heart failure with preserved ejection fraction (HFpEF) (HCC) CXR 10/14/22 pulm edema/volume overload, not apparent, off prn Furosemide, BNP 950.8 10/14/22, Echo 60-65% EF 10/13/22  Slow transit constipation stable, diet controlled  Gastroesophageal reflux disease Too soon to eval, takes Voquezna per GI 03/27/23 GI dc'd Pantoprazole, Famotidine, Carafafe.   IDA (iron deficiency anemia)  added Vit B12(Vit B12 273 12/10/22) and Fe 01/15/23. Hgb 10/0 03/03/23  Atrial fibrillation (HCC) Continue Rivaroxaban, 03/11/22 cardiology H&H, BMP q 6months  Osteoarthritis R+L knee pain, f/u Ortho for Gel inj, ambulates with walker.   History of stroke resolved difficulty word finding, off Statin, ASA, f/u stroke clinic, MRI small punctate infarct in the posteerior limb of R internal capsule, no focal weakness  Gout stable, takes Allopurinol  Prediabetes Hgb A1c 6.0 10/13/22, TSH 1.293 10/12/22, ACR 242(<30)02/26/23  Bradycardia Resolved, Hx of, off Carvedilol   History of urinary retention Hx of urinary retention, c/o urinary frequency and urgency, denied dysuria, lower abd and back pain,  taking Tamsulosin.    Family/ staff Communication: plan of care reviewed with the patient and charge nurse.   Labs/tests ordered:  none  Time spend 40 minutes.

## 2023-03-31 NOTE — Assessment & Plan Note (Signed)
Continue Rivaroxaban, 03/11/22 cardiology H&H, BMP q 6months

## 2023-03-31 NOTE — Assessment & Plan Note (Signed)
Blood pressure is controlled, on Hydralazine,  prn Clonidine, Bun/creat 17/0.8 03/03/23

## 2023-03-31 NOTE — Assessment & Plan Note (Signed)
--  stable, diet-controlled

## 2023-03-31 NOTE — Assessment & Plan Note (Signed)
stable, takes Allopurinol.

## 2023-03-31 NOTE — Assessment & Plan Note (Signed)
Resolved, Hx of, off Carvedilol

## 2023-03-31 NOTE — Assessment & Plan Note (Signed)
 Hgb A1c 6.0 10/13/22, TSH 1.293 10/12/22, ACR 242(<30)02/26/23

## 2023-03-31 NOTE — Assessment & Plan Note (Addendum)
Too soon to eval, takes Voquezna per GI 03/27/23 GI dc'd Pantoprazole, Famotidine, Carafafe.

## 2023-03-31 NOTE — Assessment & Plan Note (Signed)
off Atorvastatin, listed statin allergy,  LDL 62 10/13/22, repeat lipid panel.

## 2023-03-31 NOTE — Assessment & Plan Note (Signed)
Hx of urinary retention, c/o urinary frequency and urgency, denied dysuria, lower abd and back pain,  taking Tamsulosin.

## 2023-03-31 NOTE — Assessment & Plan Note (Signed)
R+L knee pain, f/u Ortho for Gel inj, ambulates with walker.

## 2023-04-01 ENCOUNTER — Encounter: Payer: Self-pay | Admitting: Adult Health

## 2023-04-01 ENCOUNTER — Non-Acute Institutional Stay: Payer: Self-pay | Admitting: Adult Health

## 2023-04-01 DIAGNOSIS — K2100 Gastro-esophageal reflux disease with esophagitis, without bleeding: Secondary | ICD-10-CM | POA: Diagnosis not present

## 2023-04-01 DIAGNOSIS — M109 Gout, unspecified: Secondary | ICD-10-CM | POA: Diagnosis not present

## 2023-04-01 DIAGNOSIS — R2681 Unsteadiness on feet: Secondary | ICD-10-CM | POA: Diagnosis not present

## 2023-04-01 DIAGNOSIS — I10 Essential (primary) hypertension: Secondary | ICD-10-CM | POA: Diagnosis not present

## 2023-04-01 DIAGNOSIS — R278 Other lack of coordination: Secondary | ICD-10-CM | POA: Diagnosis not present

## 2023-04-01 DIAGNOSIS — M6281 Muscle weakness (generalized): Secondary | ICD-10-CM | POA: Diagnosis not present

## 2023-04-01 MED ORDER — PANTOPRAZOLE SODIUM 40 MG PO TBEC: 40.0000 mg | DELAYED_RELEASE_TABLET | Freq: Two times a day (BID) | ORAL | 3 refills | Status: AC

## 2023-04-01 MED ORDER — FAMOTIDINE 20 MG PO TABS: 20.0000 mg | ORAL_TABLET | Freq: Two times a day (BID) | ORAL | 3 refills | Status: AC

## 2023-04-01 MED ORDER — SUCRALFATE 1 GM/10ML PO SUSP: 1.0000 g | Freq: Three times a day (TID) | ORAL | 3 refills | Status: AC

## 2023-04-01 NOTE — Progress Notes (Signed)
Location:  [facility] Room Number: AL807-A Place of Service:  ALF [phone]) Provider:  Kenard Gower, DNP, FNP-BC  Patient Care Team: Sigmund Hazel, MD as PCP - General (Family Medicine) Tessa Lerner, DO as PCP - Cardiology (Cardiology) Suzi Roots as Physician Assistant (Dermatology)  Extended Emergency Contact Information Primary Emergency Contact: [place], [place], [place] [address] [place] Home Phone: [phone] Work Phone: [phone] Mobile Phone: [phone] Relation: Son Secondary Emergency Contact: Kingbird,[name]  [place] Phone: [phone] Relation: Son  Code Status:  DNR  Goals of care: Advanced Directive information    04/01/2023    3:01 PM  Advanced Directives  Does Patient Have a Medical Advance Directive? Yes  Type of Advance Directive Out of facility DNR (pink MOST or yellow form);Healthcare Power of [place];Living will  Does patient want to make changes to medical advance directive? No - Guardian declined  Copy of Healthcare Power of Attorney in Chart? Yes - validated most recent copy scanned in chart (See row information)  Would patient like information on creating a medical advance directive? No - Patient declined  Pre-existing out of facility DNR order (yellow form or pink MOST form) Yellow form placed in chart (order not valid for inpatient use)     Chief Complaint  Patient presents with   Acute Visit    requesting medication change    HPI:  Pt is a 83 y.o. female seen today for an acute visit requesting for medication change. She is a resident of [facility]. She is currently taking Voquenza for GERD. She stated that she was given samples of Voquenza on her last visit with her GI, Dr. Vida Rigger. She has now completed the Voquenza samples and learned that she has to pay $754.75/month if she continues to take it. She stated that she continued  to have heartburns on Voquenza and cannot afford the medication copay. She wanted to go back to her previous GERD regimen of Carafate, Protonix and Famotidine.  Hypertension -  BP 132/60, takes Hydralazine and Clonidine PRN  Gout -  no recent flare ups, takes Allopurinol     Past Medical History:  Diagnosis Date   CKD (chronic kidney disease), stage II    nephrologist-- dr Kathrene Bongo  [facility])   Diabetes mellitus type 2, diet-controlled (HCC)    followed by pcp---   (09-20-2019  per pt does not check blood sugar at home)   Fracture of humeral shaft, right, closed 04/25/2022   GERD (gastroesophageal reflux disease)    History of primary hyperparathyroidism    s/p  parathyroidectomy right inferior on 12-02-2012   Humerus fracture 04/25/2022   Hyperlipidemia    Hypertension    followd by nephrologist----  (09-20-2019  per pt had stress test done some time ago , unsure when/ where, thinks told ok)   OA (osteoarthritis)    Osteoporosis    Prosthetic joint infection (HCC)    followed by dr j. hatcher (ID)   left total shoulder arthroplasty 12/ 2016  ; 12/ 2020  revision with explant joint replacement and hemiarthroplasty;  completed 6 month antibiotic 05/ 2021   Vertigo    Past Surgical History:  Procedure Laterality Date   COLONOSCOPY     DILATATION & CURETTAGE/HYSTEROSCOPY WITH MYOSURE N/A 09/28/2019   Procedure: DILATATION & CURETTAGE/HYSTEROSCOPY WITH MYOSURE;  Surgeon: Olivia Mackie, MD;  Location: [facility];  Service:  Gynecology;  Laterality: N/A;   EYE SURGERY  yrs ago   laser surgery for retinal tear.  (unilateral , pt unsure which side)   ORIF HUMERUS FRACTURE Right 04/27/2022   Procedure: OPEN REDUCTION INTERNAL FIXATION (ORIF) PROXIMAL HUMERUS FRACTURE;  Surgeon: Bjorn Pippin, MD;  Location: [facility];  Service: Orthopedics;  Laterality: Right;   PARATHYROIDECTOMY N/A 12/02/2012   Procedure: PARATHYROIDECTOMY with frozen section ;  Surgeon: Velora Heckler, MD;  Location: [facility];  Service: General;  Laterality: N/A;   REVERSE SHOULDER ARTHROPLASTY Right 04/27/2022   Procedure: REVERSE SHOULDER ARTHROPLASTY;  Surgeon: Bjorn Pippin, MD;  Location: [facility];  Service: Orthopedics;  Laterality: Right;   SHOULDER INJECTION Left 07/27/2013   Procedure: SHOULDER INJECTION;  Surgeon: Loreta Ave, MD;  Location: [facility];  Service: Orthopedics;  Laterality: Left;   STERIOD INJECTION Right 02/14/2015   Procedure: RIGHT SHOULDER CORTISONE INJECTION;  Surgeon: Loreta Ave, MD;  Location: [facility] OR;  Service: Orthopedics;  Laterality: Right;   TOTAL HIP ARTHROPLASTY Left 07/27/2013   Procedure: TOTAL HIP ARTHROPLASTY ANTERIOR APPROACH;  Surgeon: Loreta Ave, MD;  Location: [facility] OR;  Service: Orthopedics;  Laterality: Left;   TOTAL SHOULDER ARTHROPLASTY Left 02/14/2015   Procedure: LEFT TOTAL SHOULDER ARTHROPLASTY;  Surgeon: Loreta Ave, MD;  Location: [facility] OR;  Service: Orthopedics;  Laterality: Left;   TOTAL SHOULDER REVISION Left 02/09/2019   Procedure: TOTAL SHOULDER REVISION;  Surgeon: Bjorn Pippin, MD;  Location: [facility];  Service: Orthopedics;  Laterality: Left;    Allergies  Allergen Reactions   Abaloparatide Other (See Comments)    "Burred vision, lightheaded" (patient does not recall this in 2024)   Mobic [Meloxicam] Hypertension   Crestor [Rosuvastatin] Other (See Comments)    Muscle aches   Norvasc [Amlodipine Besylate] Swelling and Other (See Comments)    Ankle swelling   Simvastatin Other (See Comments)    Muscle aches, high LFTs   Sulfa Antibiotics Rash   Zetia [Ezetimibe] Other (See Comments)    Muscle aches    Outpatient Encounter Medications as of 04/01/2023  Medication Sig   acetaminophen (TYLENOL) 325 MG tablet Take 650 mg by mouth every 4 (four) hours as needed for moderate pain (pain score 4-6).   allopurinol (ZYLOPRIM) 300 MG tablet Take 300 mg by mouth daily.   cloNIDine (CATAPRES) 0.1 MG tablet Take 0.1 mg by mouth  daily as needed (for hypertension- if systolic b/p is over 160).   cyanocobalamin 1000 MCG tablet Take 1,000 mcg by mouth daily.   docusate sodium (COLACE) 100 MG capsule Take 100 mg by mouth 2 (two) times daily.   ferrous sulfate 325 (65 FE) MG tablet Take 325 mg by mouth See admin instructions. Take 325 mg by mouth by mouth once daily on Monday, Wednesday, and Friday   hydrALAZINE (APRESOLINE) 100 MG tablet Take 1 tablet (100 mg total) by mouth 3 (three) times daily.   magnesium oxide (MAG-OX) 400 (240 Mg) MG tablet Take 1 tablet (400 mg total) by mouth daily.   meclizine (ANTIVERT) 12.5 MG tablet Take 1 tablet (12.5 mg total) by mouth 3 (three) times daily as needed for dizziness.   Rivaroxaban (XARELTO) 15 MG TABS tablet Take 1 tablet (15 mg total) by mouth daily with supper.   tamsulosin (FLOMAX) 0.4 MG CAPS capsule Take 1 capsule (0.4 mg total) by mouth daily.   Vonoprazan Fumarate (VOQUEZNA) 20 MG TABS Take 1 tablet by mouth daily.   Zinc Oxide  10 % OINT Apply 1 Application topically See admin instructions. 1 application to buttocks every shift for redness   senna-docusate (SENOKOT-S) 8.6-50 MG tablet Take 1 tablet by mouth 2 (two) times daily. (Patient not taking: Reported on 04/01/2023)   No facility-administered encounter medications on file as of 04/01/2023.    Review of Systems  Constitutional:  Negative for appetite change, chills, fatigue and fever.  HENT:  Negative for congestion, hearing loss, rhinorrhea and sore throat.   Eyes: Negative.   Respiratory:  Negative for cough, shortness of breath and wheezing.   Cardiovascular:  Negative for chest pain, palpitations and leg swelling.  Gastrointestinal:  Negative for abdominal pain, constipation, diarrhea, nausea and vomiting.       Heartburn  Genitourinary:  Negative for dysuria.  Musculoskeletal:  Negative for arthralgias, back pain and myalgias.  Skin:  Negative for color change, rash and wound.  Neurological:  Negative for  dizziness, weakness and headaches.  Psychiatric/Behavioral:  Negative for behavioral problems. The patient is not nervous/anxious.       Immunization History  Administered Date(s) Administered   Influenza Split 11/24/2008   Influenza, High Dose Seasonal PF 03/16/2014, 01/14/2016, 01/21/2017, 02/24/2022   Influenza,inj,Quad PF,6+ Mos 11/20/2010, 02/01/2013   Influenza-Unspecified 11/20/2010, 02/01/2013, 02/19/2018, 02/24/2022   Moderna Sars-Covid-2 Vaccination 04/23/2019, 05/21/2019, 02/20/2020   Pneumococcal Conjugate-13 03/16/2014   Pneumococcal Polysaccharide-23 04/28/2005   Td 04/28/2005   Td (Adult) 04/28/2005   Tdap 06/08/2017   Zoster Recombinant(Shingrix) 01/14/2019   Zoster, Live 07/21/2007   Pertinent  Health Maintenance Due  Topic Date Due   OPHTHALMOLOGY EXAM  Never done   HEMOGLOBIN A1C  04/15/2023   FOOT EXAM  02/20/2024   DEXA SCAN  Completed   INFLUENZA VACCINE  Discontinued      04/12/2019    2:21 PM 07/19/2019    2:20 PM 09/28/2019    8:51 AM 06/21/2020    5:59 PM 09/23/2021    1:10 PM  Fall Risk  Falls in the past year? 0 0     (RETIRED) Patient Fall Risk Level Low fall risk Low fall risk Moderate fall risk Low fall risk Low fall risk  Fall risk Follow up Falls evaluation completed Falls evaluation completed        Vitals:   04/01/23 1502  BP: 132/60  Pulse: 77  Resp: 20  Temp: 97.6 F (36.4 C)  SpO2: 96%  Weight: 129 lb (58.5 kg)  Height: 5\' 5"  (1.651 m)   Body mass index is 21.47 kg/m.  Physical Exam Constitutional:      Appearance: Normal appearance.  HENT:     Head: Normocephalic and atraumatic.     Nose: Nose normal.     Mouth/Throat:     Mouth: Mucous membranes are moist.  Eyes:     Conjunctiva/sclera: Conjunctivae normal.  Cardiovascular:     Rate and Rhythm: Normal rate and regular rhythm.  Pulmonary:     Effort: Pulmonary effort is normal.     Breath sounds: Normal breath sounds.  Abdominal:     General: Bowel sounds are  normal.     Palpations: Abdomen is soft.  Musculoskeletal:        General: Swelling present. Normal range of motion.     Cervical back: Normal range of motion.     Right lower leg: Edema present.     Left lower leg: Edema present.     Comments: BLE 1+edema  Skin:    General: Skin is warm and dry.  Neurological:     General: No focal deficit present.     Mental Status: She is alert and oriented to person, place, and time.  Psychiatric:        Mood and Affect: Mood normal.        Behavior: Behavior normal.        Thought Content: Thought content normal.        Judgment: Judgment normal.      Labs reviewed: Recent Labs    01/06/23 0926 01/07/23 0425 01/08/23 0413 01/16/23 0000 01/19/23 0000 01/26/23 0718 01/29/23 0000 03/03/23 0000  NA 134* 134* 135   < > 137 135 136* 137  K 3.6 3.6 3.1*   < > 3.4* 4.1 3.8 3.6  CL 106 106 106   < > 105 109 105 104  CO2 22 19* 22  --  23* 21* 24* 23*  GLUCOSE 115* 89 95  --   --  98  --   --   BUN 14 13 13    < > 8 9 8 17   CREATININE 0.62 0.65 0.61   < > 0.5 0.95 0.8 0.8  CALCIUM 9.9 9.2 9.1   < > 8.4* 8.8* 9.6  --   MG 1.9 2.2 2.4  --   --   --   --   --   PHOS 2.0* 2.6 2.2*  --   --   --   --   --    < > = values in this interval not displayed.   Recent Labs    10/13/22 0738 12/24/22 0347 12/28/22 1256 01/01/23 0416 01/08/23 0413 01/16/23 0000 01/19/23 0000 03/03/23 0000  AST 14*  --  17  --  17 11* 10* 12*  ALT 10  --  14  --  11 7 3* 5*  ALKPHOS 56  --  85  --  64 72 79 76  BILITOT 1.2  --  0.8  --  0.6  --   --   --   PROT 5.1*  --  6.5  --  5.6*  --   --   --   ALBUMIN 2.6*   < > 3.0*   < > 2.7* 2.5* 2.6* 3.2*   < > = values in this interval not displayed.   Recent Labs    01/07/23 0425 01/08/23 0413 01/16/23 0000 01/19/23 0000 01/26/23 0718 01/29/23 0000 03/03/23 0000  WBC 6.4 8.8   < > 6.7 6.0 7.4 5.2  NEUTROABS  --  6.5   < > 4,730.00 3.9  --  3,474.00  HGB 8.8* 9.2*   < > 8.0* 7.9* 9.2* 10.0*  HCT 29.3*  29.3*   < > 24* 26.1* 29* 31*  MCV 99.3 96.7  --   --  94.6  --   --   PLT 231 254   < > 324 263 346 258   < > = values in this interval not displayed.   Lab Results  Component Value Date   TSH 1.499 12/28/2022   Lab Results  Component Value Date   HGBA1C 6.0 (H) 10/13/2022   Lab Results  Component Value Date   CHOL 121 10/13/2022   HDL 44 10/13/2022   LDLCALC 62 10/13/2022   TRIG 74 10/13/2022   CHOLHDL 2.8 10/13/2022    Significant Diagnostic Results in last 30 days:  No results found.  Assessment/Plan  1. Gastroesophageal reflux disease with esophagitis without hemorrhage (Primary) -  discontinue Voquenza -  follow  up with GI - pantoprazole (PROTONIX) 40 MG tablet; Take 1 tablet (40 mg total) by mouth 2 (two) times daily.  Dispense: 60 tablet; Refill: 3 - famotidine (PEPCID) 20 MG tablet; Take 1 tablet (20 mg total) by mouth 2 (two) times daily.  Dispense: 60 tablet; Refill: 3 - sucralfate (CARAFATE) 1 GM/10ML suspension; Take 10 mLs (1 g total) by mouth 4 (four) times daily -  with meals and at bedtime. Give  1 hour before meals and at bedtime on an empt stomach  Dispense: 473 mL; Refill: 3  2. Essential hypertension -  BP stable -  continue Hydralazine and Clonidine PRN  3. Gout, unspecified cause, unspecified chronicity, unspecified site -  stable -  continue Allopurinol    Family/ staff Communication: Discussed plan of care with resident and charge nurse.  Labs/tests ordered:  None    Kenard Gower, DNP, MSN, FNP-BC [facility] [phone] (Monday-Friday 8:00 a.m. - 5:00 p.m.) [phone] (after hours)

## 2023-04-02 DIAGNOSIS — R278 Other lack of coordination: Secondary | ICD-10-CM | POA: Diagnosis not present

## 2023-04-02 DIAGNOSIS — R2681 Unsteadiness on feet: Secondary | ICD-10-CM | POA: Diagnosis not present

## 2023-04-02 DIAGNOSIS — M6281 Muscle weakness (generalized): Secondary | ICD-10-CM | POA: Diagnosis not present

## 2023-04-02 DIAGNOSIS — R799 Abnormal finding of blood chemistry, unspecified: Secondary | ICD-10-CM | POA: Diagnosis not present

## 2023-04-02 DIAGNOSIS — E785 Hyperlipidemia, unspecified: Secondary | ICD-10-CM | POA: Diagnosis not present

## 2023-04-03 ENCOUNTER — Non-Acute Institutional Stay: Payer: Medicare PPO | Admitting: Nurse Practitioner

## 2023-04-03 ENCOUNTER — Encounter: Payer: Self-pay | Admitting: Nurse Practitioner

## 2023-04-03 DIAGNOSIS — K219 Gastro-esophageal reflux disease without esophagitis: Secondary | ICD-10-CM | POA: Diagnosis not present

## 2023-04-03 DIAGNOSIS — M6281 Muscle weakness (generalized): Secondary | ICD-10-CM | POA: Diagnosis not present

## 2023-04-03 DIAGNOSIS — M1712 Unilateral primary osteoarthritis, left knee: Secondary | ICD-10-CM | POA: Diagnosis not present

## 2023-04-03 DIAGNOSIS — M109 Gout, unspecified: Secondary | ICD-10-CM

## 2023-04-03 DIAGNOSIS — D509 Iron deficiency anemia, unspecified: Secondary | ICD-10-CM | POA: Diagnosis not present

## 2023-04-03 DIAGNOSIS — R2681 Unsteadiness on feet: Secondary | ICD-10-CM | POA: Diagnosis not present

## 2023-04-03 DIAGNOSIS — Z87898 Personal history of other specified conditions: Secondary | ICD-10-CM | POA: Diagnosis not present

## 2023-04-03 DIAGNOSIS — E785 Hyperlipidemia, unspecified: Secondary | ICD-10-CM | POA: Diagnosis not present

## 2023-04-03 DIAGNOSIS — I10 Essential (primary) hypertension: Secondary | ICD-10-CM | POA: Diagnosis not present

## 2023-04-03 DIAGNOSIS — I4891 Unspecified atrial fibrillation: Secondary | ICD-10-CM | POA: Diagnosis not present

## 2023-04-03 DIAGNOSIS — R278 Other lack of coordination: Secondary | ICD-10-CM | POA: Diagnosis not present

## 2023-04-03 NOTE — Progress Notes (Unsigned)
Location:   [facility] Room Number: 807 Place of Service:  ALF (13) Provider: Arna Snipe Quyen Cutsforth NP  Sigmund Hazel, MD  Patient Care Team: Sigmund Hazel, MD as PCP - General (Family Medicine) Tessa Lerner, DO as PCP - Cardiology (Cardiology) Suzi Roots as Physician Assistant (Dermatology)  Extended Emergency Contact Information Primary Emergency Contact: [name], [place] [address] [place] Home Phone: [phone] Work Phone: [phone] Mobile Phone: [phone] Relation: Son Secondary Emergency Contact: Sobieski,[name]  [place] Phone: [phone] Relation: Son  Code Status: DNR Goals of care: Advanced Directive information    04/01/2023    3:01 PM  Advanced Directives  Does Patient Have a Medical Advance Directive? Yes  Type of Advance Directive Out of facility DNR (pink MOST or yellow form);Healthcare Power of [place];Living will  Does patient want to make changes to medical advance directive? No - Guardian declined  Copy of Healthcare Power of Attorney in Chart? Yes - validated most recent copy scanned in chart (See row information)  Would patient like information on creating a medical advance directive? No - Patient declined  Pre-existing out of facility DNR order (yellow form or pink MOST form) Yellow form placed in chart (order not valid for inpatient use)     Chief Complaint  Patient presents with  . Acute Visit    Managing cholesterol.     HPI:  Pt is a 83 y.o. female seen today for an acute visit for managing cholesterol.     GERD, on  Voquezna per GI 03/27/23  03/27/23 GI dc'd Pantoprazole, Famotidine, Carafafe.  04/01/23 f/u GI, dc'd Voquenza, restarted Pantoprazole, Famotidine, Sucralfate.               Anemia, added Vit B12(Vit B12 273 12/10/22) and Fe 01/15/23. Hgb 10/0 03/03/23, 01/30/23 Cardiology dc'd ASA             New onset Atrial Fibrillation -2D echo from 10/13/2022 with a EF  of 60 to 65%, grade 1 DD and mildly dilated left atrium. -Patient with history of bradycardia arrhythmia for which she was sent to cardiology in May 2024. -Patient felt not a great candidate for long-term anticoagulation but fall risk now is lower due to debility per son. -Patient placed on Xarelto which is preferred medication per family. -Patient has been placed on full dose Lovenox in anticipation of bone marrow biopsy which was done 01/06/2023.   -Resuming Rivaroxaban 01/07/2023. 03/11/22 cardiology H&H, BMP q 6months              Saw oncology 01/19/23 MGUS, negative cone marrow biopsy, moderate risk of progression to IgM myeloma or [place] macroglobulinemia(19-37% in next 20 years), f/u.              Hydronephrosis: f/u urology for renal mass left 1.9x1.6cm             Hospitalized 12/22/22 for metabolic encephalopathy 2/2 to UTI, AKI, and hypercalcemia.              Dysphagia, f/u ST             Hospitalized 10/12/22-10/17/22 UTI, encephalopathy, acute CVA             OA R+L knee pain, f/u Ortho for Gel inj, ambulates with walker.              CVA resolved difficulty word finding, off Statin, ASA, f/u stroke clinic, MRI  small punctate infarct in the posteerior limb of R internal capsule, no focal weakness             Gout, stable, takes Allopurinol             HLD, the patient desires to resume Atorvastatin and denied any allergic reaction, LDL 62 10/13/22, LDL 69 04/02/23, f/u lipid panel 6 months.              HTN, on Hydralazine,  prn Clonidine, Bun/creat 17/0.8 03/03/23             CXR 10/14/22 pulm edema/volume overload, not apparent, off prn Furosemide, BNP 950.8 10/14/22, Echo 60-65% EF 10/13/22             Constipation, stable, diet controlled             Prediabetes Hgb A1c 6.0 10/13/22, TSH 1.293 10/12/22, ACR 242(<30)02/26/23             Positive anti CCP test, f/u Rheumatology              Bradycardia, Hx of, off Carvedilol              Hx of urinary retention, c/o urinary frequency and  urgency, denied dysuria, lower abd and back pain,  taking Tamsulosin.  Past Medical History:  Diagnosis Date  . CKD (chronic kidney disease), stage II    nephrologist-- dr Kathrene Bongo  [facility])  . Diabetes mellitus type 2, diet-controlled (HCC)    followed by pcp---   (09-20-2019  per pt does not check blood sugar at home)  . Fracture of humeral shaft, right, closed 04/25/2022  . GERD (gastroesophageal reflux disease)   . History of primary hyperparathyroidism    s/p  parathyroidectomy right inferior on 12-02-2012  . Humerus fracture 04/25/2022  . Hyperlipidemia   . Hypertension    followd by nephrologist----  (09-20-2019  per pt had stress test done some time ago , unsure when/ where, thinks told ok)  . OA (osteoarthritis)   . Osteoporosis   . Prosthetic joint infection (HCC)    followed by dr j. hatcher (ID)   left total shoulder arthroplasty 12/ 2016  ; 12/ 2020  revision with explant joint replacement and hemiarthroplasty;  completed 6 month antibiotic 05/ 2021  . Vertigo    Past Surgical History:  Procedure Laterality Date  . COLONOSCOPY    . DILATATION & CURETTAGE/HYSTEROSCOPY WITH MYOSURE N/A 09/28/2019   Procedure: DILATATION & CURETTAGE/HYSTEROSCOPY WITH MYOSURE;  Surgeon: Olivia Mackie, MD;  Location: [facility];  Service: Gynecology;  Laterality: N/A;  . EYE SURGERY  yrs ago   laser surgery for retinal tear.  (unilateral , pt unsure which side)  . ORIF HUMERUS FRACTURE Right 04/27/2022   Procedure: OPEN REDUCTION INTERNAL FIXATION (ORIF) PROXIMAL HUMERUS FRACTURE;  Surgeon: Bjorn Pippin, MD;  Location: [facility];  Service: Orthopedics;  Laterality: Right;  . PARATHYROIDECTOMY N/A 12/02/2012   Procedure: PARATHYROIDECTOMY with frozen section ;  Surgeon: Velora Heckler, MD;  Location: [facility];  Service: General;  Laterality: N/A;  . REVERSE SHOULDER ARTHROPLASTY Right 04/27/2022   Procedure: REVERSE SHOULDER ARTHROPLASTY;  Surgeon: Bjorn Pippin, MD;   Location: [facility];  Service: Orthopedics;  Laterality: Right;  . SHOULDER INJECTION Left 07/27/2013   Procedure: SHOULDER INJECTION;  Surgeon: Loreta Ave, MD;  Location: [facility] OR;  Service: Orthopedics;  Laterality: Left;  . STERIOD INJECTION Right 02/14/2015   Procedure: RIGHT SHOULDER CORTISONE INJECTION;  Surgeon: Loreta Ave, MD;  Location: [facility] OR;  Service: Orthopedics;  Laterality: Right;  . TOTAL HIP ARTHROPLASTY Left 07/27/2013   Procedure: TOTAL HIP ARTHROPLASTY ANTERIOR APPROACH;  Surgeon: Loreta Ave, MD;  Location: [facility] OR;  Service: Orthopedics;  Laterality: Left;  . TOTAL SHOULDER ARTHROPLASTY Left 02/14/2015   Procedure: LEFT TOTAL SHOULDER ARTHROPLASTY;  Surgeon: Loreta Ave, MD;  Location: [facility] OR;  Service: Orthopedics;  Laterality: Left;  . TOTAL SHOULDER REVISION Left 02/09/2019   Procedure: TOTAL SHOULDER REVISION;  Surgeon: Bjorn Pippin, MD;  Location: [facility];  Service: Orthopedics;  Laterality: Left;    Allergies  Allergen Reactions  . Abaloparatide Other (See Comments)    "Burred vision, lightheaded" (patient does not recall this in 2024)  . Mobic [Meloxicam] Hypertension  . Crestor [Rosuvastatin] Other (See Comments)    Muscle aches  . Norvasc [Amlodipine Besylate] Swelling and Other (See Comments)    Ankle swelling  . Simvastatin Other (See Comments)    Muscle aches, high LFTs  . Sulfa Antibiotics Rash  . Zetia [Ezetimibe] Other (See Comments)    Muscle aches    Allergies as of 04/03/2023       Reactions   Abaloparatide Other (See Comments)   "Burred vision, lightheaded" (patient does not recall this in 2024)   Mobic [meloxicam] Hypertension   Crestor [rosuvastatin] Other (See Comments)   Muscle aches   Norvasc [amlodipine Besylate] Swelling, Other (See Comments)   Ankle swelling   Simvastatin Other (See Comments)   Muscle aches, high LFTs   Sulfa Antibiotics Rash   Zetia [ezetimibe] Other (See Comments)   Muscle aches        Medication  List        Accurate as of April 03, 2023 11:59 PM. If you have any questions, ask your nurse or doctor.          acetaminophen 325 MG tablet Commonly known as: TYLENOL Take 650 mg by mouth every 4 (four) hours as needed for moderate pain (pain score 4-6).   allopurinol 300 MG tablet Commonly known as: ZYLOPRIM Take 300 mg by mouth daily.   cloNIDine 0.1 MG tablet Commonly known as: CATAPRES Take 0.1 mg by mouth daily as needed (for hypertension- if systolic b/p is over 160).   cyanocobalamin 1000 MCG tablet Take 1,000 mcg by mouth daily.   docusate sodium 100 MG capsule Commonly known as: COLACE Take 100 mg by mouth 2 (two) times daily.   famotidine 20 MG tablet Commonly known as: PEPCID Take 1 tablet (20 mg total) by mouth 2 (two) times daily.   ferrous sulfate 325 (65 FE) MG tablet Take 325 mg by mouth See admin instructions. Take 325 mg by mouth by mouth once daily on Monday, Wednesday, and Friday   hydrALAZINE 100 MG tablet Commonly known as: APRESOLINE Take 1 tablet (100 mg total) by mouth 3 (three) times daily.   magnesium oxide 400 (240 Mg) MG tablet Commonly known as: MAG-OX Take 1 tablet (400 mg total) by mouth daily.   meclizine 12.5 MG tablet Commonly known as: ANTIVERT Take 1 tablet (12.5 mg total) by mouth 3 (three) times daily as needed for dizziness.   pantoprazole 40 MG tablet Commonly known as: PROTONIX Take 1 tablet (40 mg total) by mouth 2 (two) times daily.   Rivaroxaban 15 MG Tabs tablet Commonly known as: XARELTO Take 1 tablet (15 mg total) by mouth daily with supper.   senna-docusate 8.6-50 MG tablet Commonly known  as: Senokot-S Take 1 tablet by mouth 2 (two) times daily.   sucralfate 1 GM/10ML suspension Commonly known as: Carafate Take 10 mLs (1 g total) by mouth 4 (four) times daily -  with meals and at bedtime. Give  1 hour before meals and at bedtime on an empt stomach   tamsulosin 0.4 MG Caps capsule Commonly known as:  FLOMAX Take 1 capsule (0.4 mg total) by mouth daily.   Zinc Oxide 10 % Oint Apply 1 Application topically See admin instructions. 1 application to buttocks every shift for redness        Review of Systems  Constitutional:  Negative for appetite change, fatigue and fever.  HENT:  Negative for congestion, mouth sores and trouble swallowing.   Eyes:  Negative for visual disturbance.  Respiratory:  Negative for cough and shortness of breath.   Cardiovascular:  Positive for leg swelling.  Gastrointestinal:  Negative for abdominal pain and constipation.       GERD symptoms intermittently  Genitourinary:  Positive for frequency and urgency. Negative for dysuria.       Nocturnal urination 2x average.  Now up x3   Musculoskeletal:  Positive for arthralgias and gait problem.       R+L knee pain, R+L shoulder limited overhead ROM, c/o pain in the R shoulder, mild.   Skin:  Positive for wound.  Neurological:  Negative for speech difficulty, weakness and headaches.  Psychiatric/Behavioral:  Positive for sleep disturbance. Negative for confusion. The patient is not nervous/anxious.        Sometimes not sleeping, declined sleeping aid.     Immunization History  Administered Date(s) Administered  . Influenza Split 11/24/2008  . Influenza, High Dose Seasonal PF 03/16/2014, 01/14/2016, 01/21/2017, 02/24/2022  . Influenza,inj,Quad PF,6+ Mos 11/20/2010, 02/01/2013  . Influenza-Unspecified 11/20/2010, 02/01/2013, 02/19/2018, 02/24/2022  . Moderna Sars-Covid-2 Vaccination 04/23/2019, 05/21/2019, 02/20/2020  . Pneumococcal Conjugate-13 03/16/2014  . Pneumococcal Polysaccharide-23 04/28/2005  . Td 04/28/2005  . Td (Adult) 04/28/2005  . Tdap 06/08/2017  . Zoster Recombinant(Shingrix) 01/14/2019  . Zoster, Live 07/21/2007   Pertinent  Health Maintenance Due  Topic Date Due  . OPHTHALMOLOGY EXAM  Never done  . HEMOGLOBIN A1C  04/15/2023  . FOOT EXAM  02/20/2024  . DEXA SCAN  Completed  .  INFLUENZA VACCINE  Discontinued      04/12/2019    2:21 PM 07/19/2019    2:20 PM 09/28/2019    8:51 AM 06/21/2020    5:59 PM 09/23/2021    1:10 PM  Fall Risk  Falls in the past year? 0 0     (RETIRED) Patient Fall Risk Level Low fall risk Low fall risk Moderate fall risk Low fall risk Low fall risk  Fall risk Follow up Falls evaluation completed Falls evaluation completed      Functional Status Survey:    Vitals:   04/03/23 1406  BP: 136/60  Pulse: 65  Resp: 18  Temp: 97.7 F (36.5 C)  SpO2: 97%  Weight: 129 lb (58.5 kg)   Body mass index is 21.47 kg/m. Physical Exam Vitals and nursing note reviewed.  Constitutional:      Appearance: Normal appearance.  HENT:     Head: Normocephalic and atraumatic.     Nose: Nose normal.     Mouth/Throat:     Mouth: Mucous membranes are moist.  Eyes:     Extraocular Movements: Extraocular movements intact.     Conjunctiva/sclera: Conjunctivae normal.     Pupils: Pupils are equal, round,  and reactive to light.  Cardiovascular:     Rate and Rhythm: Normal rate and regular rhythm.     Heart sounds: No murmur heard. Pulmonary:     Effort: Pulmonary effort is normal.     Breath sounds: Rales present. No wheezing or rhonchi.     Comments: Bibasilar rales.  Abdominal:     Palpations: Abdomen is soft.     Tenderness: There is no abdominal tenderness. There is no right CVA tenderness, guarding or rebound.  Musculoskeletal:        General: Tenderness present. No swelling or deformity.     Cervical back: Normal range of motion and neck supple.     Right lower leg: No edema.     Left lower leg: Edema present.     Comments: R+L shoulder limited overhead ROM, mild R shoulder pain. S/p R+L shoulder replacement. Left ankle trace edema.   Skin:    General: Skin is warm and dry.     Comments: skin breakdown R+L buttocks, a dime sized open area left buttock, suspected the cause of bleeding reported, no active bleeding or s/s of infection, noted non  blanchable redness R+L buttocks   Neurological:     General: No focal deficit present.     Mental Status: She is alert and oriented to person, place, and time. Mental status is at baseline.     Gait: Gait abnormal.  Psychiatric:        Mood and Affect: Mood normal.        Behavior: Behavior normal.        Thought Content: Thought content normal.        Judgment: Judgment normal.    Labs reviewed: Recent Labs    01/06/23 0926 01/07/23 0425 01/08/23 0413 01/16/23 0000 01/19/23 0000 01/26/23 0718 01/29/23 0000 03/03/23 0000  NA 134* 134* 135   < > 137 135 136* 137  K 3.6 3.6 3.1*   < > 3.4* 4.1 3.8 3.6  CL 106 106 106   < > 105 109 105 104  CO2 22 19* 22  --  23* 21* 24* 23*  GLUCOSE 115* 89 95  --   --  98  --   --   BUN 14 13 13    < > 8 9 8 17   CREATININE 0.62 0.65 0.61   < > 0.5 0.95 0.8 0.8  CALCIUM 9.9 9.2 9.1   < > 8.4* 8.8* 9.6  --   MG 1.9 2.2 2.4  --   --   --   --   --   PHOS 2.0* 2.6 2.2*  --   --   --   --   --    < > = values in this interval not displayed.   Recent Labs    10/13/22 0738 12/24/22 0347 12/28/22 1256 01/01/23 0416 01/08/23 0413 01/16/23 0000 01/19/23 0000 03/03/23 0000  AST 14*  --  17  --  17 11* 10* 12*  ALT 10  --  14  --  11 7 3* 5*  ALKPHOS 56  --  85  --  64 72 79 76  BILITOT 1.2  --  0.8  --  0.6  --   --   --   PROT 5.1*  --  6.5  --  5.6*  --   --   --   ALBUMIN 2.6*   < > 3.0*   < > 2.7* 2.5* 2.6* 3.2*   < > =  values in this interval not displayed.   Recent Labs    01/07/23 0425 01/08/23 0413 01/16/23 0000 01/19/23 0000 01/26/23 0718 01/29/23 0000 03/03/23 0000  WBC 6.4 8.8   < > 6.7 6.0 7.4 5.2  NEUTROABS  --  6.5   < > 4,730.00 3.9  --  3,474.00  HGB 8.8* 9.2*   < > 8.0* 7.9* 9.2* 10.0*  HCT 29.3* 29.3*   < > 24* 26.1* 29* 31*  MCV 99.3 96.7  --   --  94.6  --   --   PLT 231 254   < > 324 263 346 258   < > = values in this interval not displayed.   Lab Results  Component Value Date   TSH 1.499 12/28/2022    Lab Results  Component Value Date   HGBA1C 6.0 (H) 10/13/2022   Lab Results  Component Value Date   CHOL 121 10/13/2022   HDL 44 10/13/2022   LDLCALC 62 10/13/2022   TRIG 74 10/13/2022   CHOLHDL 2.8 10/13/2022    Significant Diagnostic Results in last 30 days:  No results found.  Assessment/Plan: Hyperlipidemia the patient desires to resume Atorvastatin and denied any allergic reaction, LDL 62 10/13/22, LDL 69 04/02/23, f/u lipid panel 6 months.   Gastroesophageal reflux disease Improving  on  Voquezna per GI 03/27/23  03/27/23 GI dc'd Pantoprazole, Famotidine, Carafafe.  04/01/23 f/u GI, dc'd Voquenza, restarted Pantoprazole, Famotidine, Sucralfate.   IDA (iron deficiency anemia)  added Vit B12(Vit B12 273 12/10/22) and Fe 01/15/23. Hgb 10/0 03/03/23, 01/30/23 Cardiology dc'd ASA  Atrial fibrillation (HCC) Heart rate is in control, on Xarelto per pt/HPOA request, cardiology felt she is not a great candidate for long term anticoagulation   Osteoarthritis  OA R+L knee pain, f/u Ortho for Gel inj, ambulates with walker.   Gout stable, takes Allopurinol  Essential hypertension Blood pressure is controlled, on Hydralazine,  prn Clonidine, Bun/creat 17/0.8 03/03/23  History of urinary retention  Hx of urinary retention, c/o urinary frequency and urgency, denied dysuria, lower abd and back pain,  taking Tamsulosin.    Family/ staff Communication: plan of care reviewed with the patient and charge nurse.   Labs/tests ordered:  lipid panel 6 months.  Time spend 40 minutes

## 2023-04-03 NOTE — Assessment & Plan Note (Signed)
Improving  on  Voquezna per GI 03/27/23  03/27/23 GI dc'd Pantoprazole, Famotidine, Carafafe.  04/01/23 f/u GI, dc'd Voquenza, restarted Pantoprazole, Famotidine, Sucralfate.

## 2023-04-03 NOTE — Assessment & Plan Note (Signed)
OA R+L knee pain, f/u Ortho for Gel inj, ambulates with walker.

## 2023-04-03 NOTE — Assessment & Plan Note (Signed)
Blood pressure is controlled, on Hydralazine,  prn Clonidine, Bun/creat 17/0.8 03/03/23

## 2023-04-03 NOTE — Assessment & Plan Note (Signed)
the patient desires to resume Atorvastatin and denied any allergic reaction, LDL 62 10/13/22, LDL 69 04/02/23, f/u lipid panel 6 months.

## 2023-04-03 NOTE — Assessment & Plan Note (Signed)
Heart rate is in control, on Xarelto per pt/HPOA request, cardiology felt she is not a great candidate for long term anticoagulation

## 2023-04-03 NOTE — Assessment & Plan Note (Signed)
Hx of urinary retention, c/o urinary frequency and urgency, denied dysuria, lower abd and back pain,  taking Tamsulosin.

## 2023-04-03 NOTE — Assessment & Plan Note (Signed)
stable, takes Allopurinol.

## 2023-04-03 NOTE — Assessment & Plan Note (Signed)
added Vit B12(Vit B12 273 12/10/22) and Fe 01/15/23. Hgb 10/0 03/03/23, 01/30/23 Cardiology dc'd ASA

## 2023-04-06 DIAGNOSIS — R2681 Unsteadiness on feet: Secondary | ICD-10-CM | POA: Diagnosis not present

## 2023-04-06 DIAGNOSIS — R278 Other lack of coordination: Secondary | ICD-10-CM | POA: Diagnosis not present

## 2023-04-06 DIAGNOSIS — M6281 Muscle weakness (generalized): Secondary | ICD-10-CM | POA: Diagnosis not present

## 2023-04-07 DIAGNOSIS — Z961 Presence of intraocular lens: Secondary | ICD-10-CM | POA: Diagnosis not present

## 2023-04-08 DIAGNOSIS — R2681 Unsteadiness on feet: Secondary | ICD-10-CM | POA: Diagnosis not present

## 2023-04-08 DIAGNOSIS — R278 Other lack of coordination: Secondary | ICD-10-CM | POA: Diagnosis not present

## 2023-04-08 DIAGNOSIS — M6281 Muscle weakness (generalized): Secondary | ICD-10-CM | POA: Diagnosis not present

## 2023-04-09 DIAGNOSIS — R2681 Unsteadiness on feet: Secondary | ICD-10-CM | POA: Diagnosis not present

## 2023-04-09 DIAGNOSIS — M6281 Muscle weakness (generalized): Secondary | ICD-10-CM | POA: Diagnosis not present

## 2023-04-09 DIAGNOSIS — R278 Other lack of coordination: Secondary | ICD-10-CM | POA: Diagnosis not present

## 2023-04-10 DIAGNOSIS — R2681 Unsteadiness on feet: Secondary | ICD-10-CM | POA: Diagnosis not present

## 2023-04-10 DIAGNOSIS — R278 Other lack of coordination: Secondary | ICD-10-CM | POA: Diagnosis not present

## 2023-04-10 DIAGNOSIS — M6281 Muscle weakness (generalized): Secondary | ICD-10-CM | POA: Diagnosis not present

## 2023-04-13 DIAGNOSIS — M6281 Muscle weakness (generalized): Secondary | ICD-10-CM | POA: Diagnosis not present

## 2023-04-13 DIAGNOSIS — R278 Other lack of coordination: Secondary | ICD-10-CM | POA: Diagnosis not present

## 2023-04-13 DIAGNOSIS — R2681 Unsteadiness on feet: Secondary | ICD-10-CM | POA: Diagnosis not present

## 2023-04-15 DIAGNOSIS — R278 Other lack of coordination: Secondary | ICD-10-CM | POA: Diagnosis not present

## 2023-04-15 DIAGNOSIS — R2681 Unsteadiness on feet: Secondary | ICD-10-CM | POA: Diagnosis not present

## 2023-04-15 DIAGNOSIS — M6281 Muscle weakness (generalized): Secondary | ICD-10-CM | POA: Diagnosis not present

## 2023-04-16 DIAGNOSIS — R278 Other lack of coordination: Secondary | ICD-10-CM | POA: Diagnosis not present

## 2023-04-16 DIAGNOSIS — M6281 Muscle weakness (generalized): Secondary | ICD-10-CM | POA: Diagnosis not present

## 2023-04-16 DIAGNOSIS — R2681 Unsteadiness on feet: Secondary | ICD-10-CM | POA: Diagnosis not present

## 2023-04-17 ENCOUNTER — Encounter: Payer: Self-pay | Admitting: Nurse Practitioner

## 2023-04-17 ENCOUNTER — Non-Acute Institutional Stay: Payer: Medicare PPO | Admitting: Nurse Practitioner

## 2023-04-17 DIAGNOSIS — I4891 Unspecified atrial fibrillation: Secondary | ICD-10-CM

## 2023-04-17 DIAGNOSIS — R799 Abnormal finding of blood chemistry, unspecified: Secondary | ICD-10-CM | POA: Diagnosis not present

## 2023-04-17 DIAGNOSIS — K2100 Gastro-esophageal reflux disease with esophagitis, without bleeding: Secondary | ICD-10-CM

## 2023-04-17 DIAGNOSIS — R2681 Unsteadiness on feet: Secondary | ICD-10-CM | POA: Diagnosis not present

## 2023-04-17 DIAGNOSIS — I10 Essential (primary) hypertension: Secondary | ICD-10-CM

## 2023-04-17 DIAGNOSIS — R32 Unspecified urinary incontinence: Secondary | ICD-10-CM | POA: Insufficient documentation

## 2023-04-17 DIAGNOSIS — R296 Repeated falls: Secondary | ICD-10-CM | POA: Diagnosis not present

## 2023-04-17 DIAGNOSIS — M1712 Unilateral primary osteoarthritis, left knee: Secondary | ICD-10-CM

## 2023-04-17 DIAGNOSIS — R278 Other lack of coordination: Secondary | ICD-10-CM | POA: Diagnosis not present

## 2023-04-17 DIAGNOSIS — N390 Urinary tract infection, site not specified: Secondary | ICD-10-CM | POA: Diagnosis not present

## 2023-04-17 DIAGNOSIS — M6281 Muscle weakness (generalized): Secondary | ICD-10-CM | POA: Diagnosis not present

## 2023-04-17 NOTE — Assessment & Plan Note (Signed)
 Stable, continue Pantoprazole , Famotidine , Sucralfate .

## 2023-04-17 NOTE — Assessment & Plan Note (Signed)
 Heart rate is in control, continue Rivaroxaban .

## 2023-04-17 NOTE — Assessment & Plan Note (Signed)
 fall, staff reported the patient's lower back pain, unable to walk, needs assistance for incontinent care. Denied lower back pain and ambulated with walker upon my examination. The patient denied cough, SOB, abd pain, dysuria, nausea, vomiting, she is afebrile.  Obtain UA C/S, update CBC/diff, CMP/eGFR  Hx of urinary retention, taking Tamsulosin .

## 2023-04-17 NOTE — Assessment & Plan Note (Signed)
 Blood pressure is controlled, on Hydralazine,  prn Clonidine, Bun/creat 17/0.8 03/03/23

## 2023-04-17 NOTE — Progress Notes (Signed)
 Location:   [facility] Room Number: 807 Place of Service:  ALF (13) Provider: Larwance Carsynn Bethune NP  Cleotilde Planas, MD  Patient Care Team: Cleotilde Planas, MD as PCP - General (Family Medicine) Michele Richardson, DO as PCP - Cardiology (Cardiology) Porter Andrez JONELLE DEVONNA as Physician Assistant (Dermatology)  Extended Emergency Contact Information Primary Emergency Contact: Maddal[place], [place], [place] [address] [place] Home Phone: [phone] Work Phone: [phone] Mobile Phone: [phone] Relation: Son Secondary Emergency Contact: Gradilla,[name]  [place] Phone: [phone] Relation: Son  Code Status: DNR Goals of care: Advanced Directive information    04/01/2023    3:01 PM  Advanced Directives  Does Patient Have a Medical Advance Directive? Yes  Type of Advance Directive Out of facility DNR (pink MOST or yellow form);Healthcare Power of [place];Living will  Does patient want to make changes to medical advance directive? No - Guardian declined  Copy of Healthcare Power of Attorney in Chart? Yes - validated most recent copy scanned in chart (See row information)  Would patient like information on creating a medical advance directive? No - Patient declined  Pre-existing out of facility DNR order (yellow form or pink MOST form) Yellow form placed in chart (order not valid for inpatient use)     Chief Complaint  Patient presents with   Acute Visit    HPI:  Pt is a 83 y.o. female seen today for an acute visit for fall, staff reported the patient's lower back pain, unable to walk, needs assistance for incontinent care. Denied lower back pain and ambulated with walker upon my examination. The patient denied cough, SOB, abd pain, dysuria, nausea, vomiting, she is afebrile.   GERD, on  Voquezna per GI 03/27/23  03/27/23 GI dc'd Pantoprazole , Famotidine , Carafafe.  04/01/23 f/u GI, dc'd Voquenza, restarted Pantoprazole , Famotidine ,  Sucralfate .               Anemia, added Vit B12(Vit B12 273 12/10/22) and Fe 01/15/23. Hgb 10.0 03/03/23, 01/30/23 Cardiology dc'd ASA             New onset Atrial Fibrillation -2D echo from 10/13/2022 with a EF of 60 to 65%, grade 1 DD and mildly dilated left atrium. -Patient with history of bradycardia arrhythmia for which she was sent to cardiology in May 2024. -Patient felt not a great candidate for long-term anticoagulation but fall risk now is lower due to debility per son. -Patient placed on Xarelto  which is preferred medication per family. -Patient has been placed on full dose Lovenox  in anticipation of bone marrow biopsy which was done 01/06/2023.   -Resuming Rivaroxaban  01/07/2023. 03/11/22 cardiology H&H, BMP q 6months              Saw oncology 01/19/23 MGUS, negative cone marrow biopsy, moderate risk of progression to IgM myeloma or [place] macroglobulinemia(19-37% in next 20 years), f/u.              Hydronephrosis: f/u urology for renal mass left 1.9x1.6cm             Hospitalized 12/22/22 for metabolic encephalopathy 2/2 to UTI, AKI, and hypercalcemia.              Dysphagia, f/u ST             Hospitalized 10/12/22-10/17/22 UTI, encephalopathy, acute CVA             OA R+L knee pain, f/u Ortho for  Gel inj, ambulates with walker.              CVA resolved difficulty word finding, off Statin, ASA, f/u stroke clinic, MRI small punctate infarct in the posteerior limb of R internal capsule, no focal weakness             Gout, stable, takes Allopurinol              HLD, the patient desires to resume Atorvastatin  and denied any allergic reaction, LDL 62 10/13/22, LDL 69 04/02/23, f/u lipid panel 6 months.              HTN, on Hydralazine ,  prn Clonidine , Bun/creat 17/0.8 03/03/23             CXR 10/14/22 pulm edema/volume overload, not apparent, off prn Furosemide , BNP 950.8 10/14/22, Echo 60-65% EF 10/13/22             Constipation, stable, diet controlled             Prediabetes Hgb A1c 6.0 10/13/22,  TSH 1.293 10/12/22, ACR 242(<30)02/26/23             Positive anti CCP test, f/u Rheumatology              Bradycardia, Hx of, off Carvedilol               Hx of urinary retention, taking Tamsulosin .    Past Medical History:  Diagnosis Date   CKD (chronic kidney disease), stage II    nephrologist-- dr prescilla  [facility])   Diabetes mellitus type 2, diet-controlled (HCC)    followed by pcp---   (09-20-2019  per pt does not check blood sugar at home)   Fracture of humeral shaft, right, closed 04/25/2022   GERD (gastroesophageal reflux disease)    History of primary hyperparathyroidism    s/p  parathyroidectomy right inferior on 12-02-2012   Humerus fracture 04/25/2022   Hyperlipidemia    Hypertension    followd by nephrologist----  (09-20-2019  per pt had stress test done some time ago , unsure when/ where, thinks told ok)   OA (osteoarthritis)    Osteoporosis    Prosthetic joint infection (HCC)    followed by dr j. hatcher (ID)   left total shoulder arthroplasty 12/ 2016  ; 12/ 2020  revision with explant joint replacement and hemiarthroplasty;  completed 6 month antibiotic 05/ 2021   Vertigo    Past Surgical History:  Procedure Laterality Date   COLONOSCOPY     DILATATION & CURETTAGE/HYSTEROSCOPY WITH MYOSURE N/A 09/28/2019   Procedure: DILATATION & CURETTAGE/HYSTEROSCOPY WITH MYOSURE;  Surgeon: Gorge Ade, MD;  Location: [facility] [place];  Service: Gynecology;  Laterality: N/A;   EYE SURGERY  yrs ago   laser surgery for retinal tear.  (unilateral , pt unsure which side)   ORIF HUMERUS FRACTURE Right 04/27/2022   Procedure: OPEN REDUCTION INTERNAL FIXATION (ORIF) PROXIMAL HUMERUS FRACTURE;  Surgeon: Cristy Bonner DASEN, MD;  Location: [facility];  Service: Orthopedics;  Laterality: Right;   PARATHYROIDECTOMY N/A 12/02/2012   Procedure: PARATHYROIDECTOMY with frozen section ;  Surgeon: Krystal CHRISTELLA Spinner, MD;  Location: [facility];  Service: General;  Laterality: N/A;   REVERSE  SHOULDER ARTHROPLASTY Right 04/27/2022   Procedure: REVERSE SHOULDER ARTHROPLASTY;  Surgeon: Cristy Bonner DASEN, MD;  Location: [facility];  Service: Orthopedics;  Laterality: Right;   SHOULDER INJECTION Left 07/27/2013   Procedure: SHOULDER INJECTION;  Surgeon: Toribio JULIANNA Chancy, MD;  [facility];  Service: Orthopedics;  Laterality: Left;   STERIOD INJECTION Right 02/14/2015   Procedure: RIGHT SHOULDER CORTISONE INJECTION;  Surgeon: Toribio JULIANNA Chancy, MD;  Location: [facility] OR;  Service: Orthopedics;  Laterality: Right;   TOTAL HIP ARTHROPLASTY Left 07/27/2013   Procedure: TOTAL HIP ARTHROPLASTY ANTERIOR APPROACH;  Surgeon: Toribio JULIANNA Chancy, MD;  Location: [facility] OR;  Service: Orthopedics;  Laterality: Left;   TOTAL SHOULDER ARTHROPLASTY Left 02/14/2015   Procedure: LEFT TOTAL SHOULDER ARTHROPLASTY;  Surgeon: Toribio JULIANNA Chancy, MD;  Location[facility] OR;  Service: Orthopedics;  Laterality: Left;   TOTAL SHOULDER REVISION Left 02/09/2019   Procedure: TOTAL SHOULDER REVISION;  Surgeon: Cristy Bonner DASEN, MD;  Location: [facility];  Service: Orthopedics;  Laterality: Left;    Allergies  Allergen Reactions   Abaloparatide Other (See Comments)    Burred vision, lightheaded (patient does not recall this in 2024)   Mobic  [Meloxicam ] Hypertension   Crestor [Rosuvastatin] Other (See Comments)    Muscle aches   Norvasc [Amlodipine Besylate] Swelling and Other (See Comments)    Ankle swelling   Simvastatin Other (See Comments)    Muscle aches, high LFTs   Sulfa Antibiotics Rash   Zetia [Ezetimibe] Other (See Comments)    Muscle aches    Allergies as of 04/17/2023       Reactions   Abaloparatide Other (See Comments)   Burred vision, lightheaded (patient does not recall this in 2024)   Mobic  [meloxicam ] Hypertension   Crestor [rosuvastatin] Other (See Comments)   Muscle aches   Norvasc [amlodipine Besylate] Swelling, Other (See Comments)   Ankle swelling   Simvastatin Other (See Comments)   Muscle aches, high LFTs   Sulfa  Antibiotics Rash   Zetia [ezetimibe] Other (See Comments)   Muscle aches        Medication List        Accurate as of April 17, 2023  2:00 PM. If you have any questions, ask your nurse or doctor.          acetaminophen  325 MG tablet Commonly known as: TYLENOL  Take 650 mg by mouth every 4 (four) hours as needed for moderate pain (pain score 4-6).   allopurinol  300 MG tablet Commonly known as: ZYLOPRIM  Take 300 mg by mouth daily.   cloNIDine  0.1 MG tablet Commonly known as: CATAPRES  Take 0.1 mg by mouth daily as needed (for hypertension- if systolic b/p is over 160).   cyanocobalamin  1000 MCG tablet Take 1,000 mcg by mouth daily.   docusate sodium  100 MG capsule Commonly known as: COLACE Take 100 mg by mouth 2 (two) times daily.   famotidine  20 MG tablet Commonly known as: PEPCID  Take 1 tablet (20 mg total) by mouth 2 (two) times daily.   ferrous sulfate  325 (65 FE) MG tablet Take 325 mg by mouth See admin instructions. Take 325 mg by mouth by mouth once daily on Monday, Wednesday, and Friday   hydrALAZINE  100 MG tablet Commonly known as: APRESOLINE  Take 1 tablet (100 mg total) by mouth 3 (three) times daily.   magnesium  oxide 400 (240 Mg) MG tablet Commonly known as: MAG-OX Take 1 tablet (400 mg total) by mouth daily.   meclizine  12.5 MG tablet Commonly known as: ANTIVERT  Take 1 tablet (12.5 mg total) by mouth 3 (three) times daily as needed for dizziness.   pantoprazole  40 MG tablet Commonly known as: PROTONIX  Take 1 tablet (40 mg total) by mouth 2 (two) times daily.   Rivaroxaban  15 MG Tabs tablet Commonly known as:  XARELTO  Take 1 tablet (15 mg total) by mouth daily with supper.   senna-docusate 8.6-50 MG tablet Commonly known as: Senokot-S Take 1 tablet by mouth 2 (two) times daily.   sucralfate  1 GM/10ML suspension Commonly known as: Carafate  Take 10 mLs (1 g total) by mouth 4 (four) times daily -  with meals and at bedtime. Give  1 hour  before meals and at bedtime on an empt stomach   tamsulosin  0.4 MG Caps capsule Commonly known as: FLOMAX  Take 1 capsule (0.4 mg total) by mouth daily.   Zinc Oxide 10 % Oint Apply 1 Application topically See admin instructions. 1 application to buttocks every shift for redness        Review of Systems  Constitutional:  Positive for fatigue. Negative for appetite change and fever.  HENT:  Negative for congestion, mouth sores and trouble swallowing.   Eyes:  Negative for visual disturbance.  Respiratory:  Negative for cough and shortness of breath.   Cardiovascular:  Negative for leg swelling.  Gastrointestinal:  Negative for abdominal pain, constipation, nausea and vomiting.       GERD symptoms intermittently  Genitourinary:  Negative for difficulty urinating, dysuria, frequency and urgency.       Nocturnal urination 2x average.  Incontinent of urine today   Musculoskeletal:  Positive for arthralgias and gait problem.       R+L knee pain, R+L shoulder limited overhead ROM, c/o pain in the R shoulder, mild.   Skin:  Positive for wound.  Neurological:  Negative for speech difficulty, weakness and headaches.  Psychiatric/Behavioral:  Positive for sleep disturbance. Negative for confusion. The patient is not nervous/anxious.        Sometimes not sleeping, declined sleeping aid.     Immunization History  Administered Date(s) Administered   Influenza Split 11/24/2008   Influenza, High Dose Seasonal PF 03/16/2014, 01/14/2016, 01/21/2017, 02/24/2022   Influenza,inj,Quad PF,6+ Mos 11/20/2010, 02/01/2013   Influenza-Unspecified 11/20/2010, 02/01/2013, 02/19/2018, 02/24/2022   Moderna Sars-Covid-2 Vaccination 04/23/2019, 05/21/2019, 02/20/2020   Pneumococcal Conjugate-13 03/16/2014   Pneumococcal Polysaccharide-23 04/28/2005   Td 04/28/2005   Td (Adult) 04/28/2005   Tdap 06/08/2017   Zoster Recombinant(Shingrix) 01/14/2019   Zoster, Live 07/21/2007   Pertinent  Health Maintenance  Due  Topic Date Due   OPHTHALMOLOGY EXAM  Never done   HEMOGLOBIN A1C  04/15/2023   FOOT EXAM  02/20/2024   DEXA SCAN  Completed   INFLUENZA VACCINE  Discontinued      04/12/2019    2:21 PM 07/19/2019    2:20 PM 09/28/2019    8:51 AM 06/21/2020    5:59 PM 09/23/2021    1:10 PM  Fall Risk  Falls in the past year? 0 0     (RETIRED) Patient Fall Risk Level Low fall risk Low fall risk Moderate fall risk Low fall risk Low fall risk  Fall risk Follow up Falls evaluation completed Falls evaluation completed      Functional Status Survey:    Vitals:   04/17/23 1342  BP: (!) 149/70  Pulse: 82  Resp: 16  Temp: (!) 97.3 F (36.3 C)  SpO2: 97%  Weight: 128 lb 3.2 oz (58.2 kg)   Body mass index is 21.33 kg/m. Physical Exam Vitals and nursing note reviewed.  Constitutional:      Comments: fatigue  HENT:     Head: Normocephalic and atraumatic.     Nose: Nose normal.     Mouth/Throat:     Mouth: Mucous membranes are  moist.  Eyes:     Extraocular Movements: Extraocular movements intact.     Conjunctiva/sclera: Conjunctivae normal.     Pupils: Pupils are equal, round, and reactive to light.  Cardiovascular:     Rate and Rhythm: Normal rate and regular rhythm.     Heart sounds: No murmur heard. Pulmonary:     Effort: Pulmonary effort is normal.     Breath sounds: Rales present. No wheezing or rhonchi.     Comments: Bibasilar rales.  Abdominal:     Palpations: Abdomen is soft.     Tenderness: There is no abdominal tenderness. There is no right CVA tenderness, guarding or rebound.  Musculoskeletal:        General: Tenderness present.     Cervical back: Normal range of motion and neck supple.     Right lower leg: No edema.     Left lower leg: No edema.     Comments: R+L shoulder limited overhead ROM, mild R shoulder pain. S/p R+L shoulder replacement.   Skin:    General: Skin is warm and dry.     Comments: skin breakdown R+L buttocks, a dime sized open area left buttock,  suspected the cause of bleeding reported, no active bleeding or s/s of infection, noted non blanchable redness R+L buttocks   Neurological:     General: No focal deficit present.     Mental Status: She is alert and oriented to person, place, and time. Mental status is at baseline.     Gait: Gait abnormal.  Psychiatric:        Mood and Affect: Mood normal.        Behavior: Behavior normal.        Thought Content: Thought content normal.     Labs reviewed: Recent Labs    01/06/23 0926 01/07/23 0425 01/08/23 0413 01/16/23 0000 01/19/23 0000 01/26/23 0718 01/29/23 0000 03/03/23 0000  NA 134* 134* 135   < > 137 135 136* 137  K 3.6 3.6 3.1*   < > 3.4* 4.1 3.8 3.6  CL 106 106 106   < > 105 109 105 104  CO2 22 19* 22  --  23* 21* 24* 23*  GLUCOSE 115* 89 95  --   --  98  --   --   BUN 14 13 13    < > 8 9 8 17   CREATININE 0.62 0.65 0.61   < > 0.5 0.95 0.8 0.8  CALCIUM  9.9 9.2 9.1   < > 8.4* 8.8* 9.6  --   MG 1.9 2.2 2.4  --   --   --   --   --   PHOS 2.0* 2.6 2.2*  --   --   --   --   --    < > = values in this interval not displayed.   Recent Labs    10/13/22 0738 12/24/22 0347 12/28/22 1256 01/01/23 0416 01/08/23 0413 01/16/23 0000 01/19/23 0000 03/03/23 0000  AST 14*  --  17  --  17 11* 10* 12*  ALT 10  --  14  --  11 7 3* 5*  ALKPHOS 56  --  85  --  64 72 79 76  BILITOT 1.2  --  0.8  --  0.6  --   --   --   PROT 5.1*  --  6.5  --  5.6*  --   --   --   ALBUMIN  2.6*   < > 3.0*   < >  2.7* 2.5* 2.6* 3.2*   < > = values in this interval not displayed.   Recent Labs    01/07/23 0425 01/08/23 0413 01/16/23 0000 01/19/23 0000 01/26/23 0718 01/29/23 0000 03/03/23 0000  WBC 6.4 8.8   < > 6.7 6.0 7.4 5.2  NEUTROABS  --  6.5   < > 4,730.00 3.9  --  3,474.00  HGB 8.8* 9.2*   < > 8.0* 7.9* 9.2* 10.0*  HCT 29.3* 29.3*   < > 24* 26.1* 29* 31*  MCV 99.3 96.7  --   --  94.6  --   --   PLT 231 254   < > 324 263 346 258   < > = values in this interval not displayed.   Lab  Results  Component Value Date   TSH 1.499 12/28/2022   Lab Results  Component Value Date   HGBA1C 6.0 (H) 10/13/2022   Lab Results  Component Value Date   CHOL 121 10/13/2022   HDL 44 10/13/2022   LDLCALC 62 10/13/2022   TRIG 74 10/13/2022   CHOLHDL 2.8 10/13/2022    Significant Diagnostic Results in last 30 days:  No results found.  Assessment/Plan: Incontinent of urine fall, staff reported the patient's lower back pain, unable to walk, needs assistance for incontinent care. Denied lower back pain and ambulated with walker upon my examination. The patient denied cough, SOB, abd pain, dysuria, nausea, vomiting, she is afebrile.  Obtain UA C/S, update CBC/diff, CMP/eGFR  Hx of urinary retention, taking Tamsulosin .    Reflux esophagitis Stable, continue Pantoprazole , Famotidine , Sucralfate .   Atrial fibrillation (HCC) Heart rate is in control, continue Rivaroxaban .   Osteoarthritis  OA R+L knee pain, f/u Ortho for Gel inj, ambulates with walker.     Family/ staff Communication: plan of care reviewed with the patient and charge nurse.   Labs/tests ordered: CBC/diff, CMP/eGFR, UA C/S

## 2023-04-17 NOTE — Assessment & Plan Note (Signed)
 OA R+L knee pain, f/u Ortho for Gel inj, ambulates with walker.

## 2023-04-19 DIAGNOSIS — M6281 Muscle weakness (generalized): Secondary | ICD-10-CM | POA: Diagnosis not present

## 2023-04-19 DIAGNOSIS — R278 Other lack of coordination: Secondary | ICD-10-CM | POA: Diagnosis not present

## 2023-04-19 DIAGNOSIS — R2681 Unsteadiness on feet: Secondary | ICD-10-CM | POA: Diagnosis not present

## 2023-04-20 ENCOUNTER — Non-Acute Institutional Stay: Payer: Self-pay | Admitting: Nurse Practitioner

## 2023-04-20 ENCOUNTER — Encounter: Payer: Self-pay | Admitting: Nurse Practitioner

## 2023-04-20 DIAGNOSIS — E876 Hypokalemia: Secondary | ICD-10-CM | POA: Diagnosis not present

## 2023-04-20 DIAGNOSIS — I4891 Unspecified atrial fibrillation: Secondary | ICD-10-CM | POA: Diagnosis not present

## 2023-04-20 DIAGNOSIS — Z87898 Personal history of other specified conditions: Secondary | ICD-10-CM | POA: Diagnosis not present

## 2023-04-20 DIAGNOSIS — I10 Essential (primary) hypertension: Secondary | ICD-10-CM

## 2023-04-20 DIAGNOSIS — K2100 Gastro-esophageal reflux disease with esophagitis, without bleeding: Secondary | ICD-10-CM

## 2023-04-20 DIAGNOSIS — D509 Iron deficiency anemia, unspecified: Secondary | ICD-10-CM

## 2023-04-20 NOTE — Assessment & Plan Note (Signed)
 added Vit B12(Vit B12 273 12/10/22) and Fe 01/15/23. 01/30/23 Cardiology dc'd ASA

## 2023-04-20 NOTE — Assessment & Plan Note (Addendum)
 Stablizing, on Pantoprazole , Famotidine , Sucralfate . Desires f/u GI

## 2023-04-20 NOTE — Assessment & Plan Note (Addendum)
 serum K 2.7 04/17/23, Kcl 40meq every day x2 days, repeated serum K 2.9 04/18/23, improved generalized weakness, pending urine culture Kcl 40meq every day x3 Repeat BMP 04/23/23 The patient stated her appetite and oral intake are at her baseline after a few days of poor oral intake, declined diarrhea or vomiting, GERD symptoms: not better, wants to f/u GI

## 2023-04-20 NOTE — Assessment & Plan Note (Signed)
 Stable, taking Tamsulosin .

## 2023-04-20 NOTE — Assessment & Plan Note (Signed)
 Blood pressure is managed, continue Hydralazine  and prn Clonidine .

## 2023-04-20 NOTE — Assessment & Plan Note (Signed)
 Heart rate is in control, resumed Rivaroxaban  01/07/23, followed by Cardiology, off Carvedilol  2/2 to bradycardia.

## 2023-04-20 NOTE — Progress Notes (Signed)
 Location:   [facility] Room Number: 807 Place of Service:  ALF (13) Provider: Abner Hoffman Franciscojavier Wronski NP  [name], MD  Patient Care Team: [name], MD as PCP - General (Family Medicine) Olinda Bertrand, DO as PCP - Cardiology (Cardiology) Reggy Capers as Physician Assistant (Dermatology)  Extended Emergency Contact Information Primary Emergency Contact: Dauphine, [place], [place] [address] [place] Home Phone: [phone] Work Phone: [phone] Mobile Phone: [phone] Relation: Son Secondary Emergency Contact: Esterline,[name]  [place] Phone: [phone] Relation: Son  Code Status: DNR Goals of care: Advanced Directive information    04/01/2023    3:01 PM  Advanced Directives  Does Patient Have a Medical Advance Directive? Yes  Type of Advance Directive Out of facility DNR (pink MOST or yellow form);Healthcare Power of [place];Living will  Does patient want to make changes to medical advance directive? No - Guardian declined  Copy of Healthcare Power of Attorney in Chart? Yes - validated most recent copy scanned in chart (See row information)  Would patient like information on creating a medical advance directive? No - Patient declined  Pre-existing out of facility DNR order (yellow form or pink MOST form) Yellow form placed in chart (order not valid for inpatient use)     Chief Complaint  Patient presents with   Acute Visit    Hypokalemia     HPI:  Pt is a 83 y.o. female seen today for an acute visit for hypokalemia, serum K 2.7 04/17/23, Kcl 40meq every day x2 days, repeated serum K 2.9 04/18/23, improved generalized weakness, pending urine culture. The patient stated her appetite and oral intake are at her baseline after a few days of poor oral intake, declined diarrhea or vomiting, GERD symptoms: not better, wants to f/u GI  GERD, on  Voquezna per GI 03/27/23  03/27/23 GI dc'd Pantoprazole , Famotidine ,  Carafafe.  04/01/23 f/u GI, dc'd Voquenza, restarted Pantoprazole , Famotidine , Sucralfate .               Anemia, added Vit B12(Vit B12 273 12/10/22) and Fe 01/15/23. 01/30/23 Cardiology dc'd ASA             New onset Atrial Fibrillation -2D echo from 10/13/2022 with a EF of 60 to 65%, grade 1 DD and mildly dilated left atrium. -Patient with history of bradycardia arrhythmia for which she was sent to cardiology in May 2024. -Patient felt not a great candidate for long-term anticoagulation but fall risk now is lower due to debility per son. -Patient placed on Xarelto  which is preferred medication per family. -Patient has been placed on full dose Lovenox  in anticipation of bone marrow biopsy which was done 01/06/2023.   -Resuming Rivaroxaban  01/07/2023. 03/11/22 cardiology H&H, BMP q 6months              Saw oncology 01/19/23 MGUS, negative cone marrow biopsy, moderate risk of progression to IgM myeloma or [place] macroglobulinemia(19-37% in next 20 years), f/u.              Hydronephrosis: f/u urology for renal mass left 1.9x1.6cm             Hospitalized 12/22/22 for metabolic encephalopathy 2/2 to UTI, AKI, and hypercalcemia.              Dysphagia, f/u ST             Hospitalized 10/12/22-10/17/22 UTI, encephalopathy, acute CVA  OA R+L knee pain, f/u Ortho for Gel inj, ambulates with walker.              CVA resolved difficulty word finding, off Statin, ASA, f/u stroke clinic, MRI small punctate infarct in the posteerior limb of R internal capsule, no focal weakness             Gout, stable, takes Allopurinol              HLD, the patient desires to resume Atorvastatin  and denied any allergic reaction, LDL 62 10/13/22, LDL 69 04/02/23, f/u lipid panel 6 months.              HTN, on Hydralazine ,  prn Clonidine              CXR 10/14/22 pulm edema/volume overload, not apparent, off prn Furosemide , BNP 950.8 10/14/22, Echo 60-65% EF 10/13/22             Constipation, stable, diet controlled              Prediabetes Hgb A1c 6.0 10/13/22, TSH 1.293 10/12/22, ACR 242(<30)02/26/23             Positive anti CCP test, f/u Rheumatology              Bradycardia, Hx of, off Carvedilol               Hx of urinary retention, taking Tamsulosin .     Past Medical History:  Diagnosis Date   CKD (chronic kidney disease), stage II    nephrologist-- dr Ansel Kingdom  [facility])   Diabetes mellitus type 2, diet-controlled (HCC)    followed by pcp---   (09-20-2019  per pt does not check blood sugar at home)   Fracture of humeral shaft, right, closed 04/25/2022   GERD (gastroesophageal reflux disease)    History of primary hyperparathyroidism    s/p  parathyroidectomy right inferior on 12-02-2012   Humerus fracture 04/25/2022   Hyperlipidemia    Hypertension    followd by nephrologist----  (09-20-2019  per pt had stress test done some time ago , unsure when/ where, thinks told ok)   OA (osteoarthritis)    Osteoporosis    Prosthetic joint infection (HCC)    followed by dr j. hatcher (ID)   left total shoulder arthroplasty 12/ 2016  ; 12/ 2020  revision with explant joint replacement and hemiarthroplasty;  completed 6 month antibiotic 05/ 2021   Vertigo    Past Surgical History:  Procedure Laterality Date   COLONOSCOPY     DILATATION & CURETTAGE/HYSTEROSCOPY WITH MYOSURE N/A 09/28/2019   Procedure: DILATATION & CURETTAGE/HYSTEROSCOPY WITH MYOSURE;  Surgeon: Meriam Stamp, MD;  Location: [facility];  Service: Gynecology;  Laterality: N/A;   EYE SURGERY  yrs ago   laser surgery for retinal tear.  (unilateral , pt unsure which side)   ORIF HUMERUS FRACTURE Right 04/27/2022   Procedure: OPEN REDUCTION INTERNAL FIXATION (ORIF) PROXIMAL HUMERUS FRACTURE;  Surgeon: Micheline Ahr, MD;  Location: [facility];  Service: Orthopedics;  Laterality: Right;   PARATHYROIDECTOMY N/A 12/02/2012   Procedure: PARATHYROIDECTOMY with frozen section ;  Surgeon: Keitha Pata, MD;  Location: [facility];  Service:  General;  Laterality: N/A;   REVERSE SHOULDER ARTHROPLASTY Right 04/27/2022   Procedure: REVERSE SHOULDER ARTHROPLASTY;  Surgeon: Micheline Ahr, MD;  Location: [facility];  Service: Orthopedics;  Laterality: Right;   SHOULDER INJECTION Left 07/27/2013   Procedure: SHOULDER INJECTION;  Surgeon: Ferd Householder,  MD;  Location: [facility];  Service: Orthopedics;  Laterality: Left;   STERIOD INJECTION Right 02/14/2015   Procedure: RIGHT SHOULDER CORTISONE INJECTION;  Surgeon: Ferd Householder, MD;  Location: [facility] OR;  Service: Orthopedics;  Laterality: Right;   TOTAL HIP ARTHROPLASTY Left 07/27/2013   Procedure: TOTAL HIP ARTHROPLASTY ANTERIOR APPROACH;  Surgeon: Ferd Householder, MD;  Location[facility];  Service: Orthopedics;  Laterality: Left;   TOTAL SHOULDER ARTHROPLASTY Left 02/14/2015   Procedure: LEFT TOTAL SHOULDER ARTHROPLASTY;  Surgeon: Ferd Householder, MD;  Location: [facility];  Service: Orthopedics;  Laterality: Left;   TOTAL SHOULDER REVISION Left 02/09/2019   Procedure: TOTAL SHOULDER REVISION;  Surgeon: Micheline Ahr, MD;  Location: [facility];  Service: Orthopedics;  Laterality: Left;    Allergies  Allergen Reactions   Abaloparatide Other (See Comments)    "Burred vision, lightheaded" (patient does not recall this in 2024)   Mobic  [Meloxicam ] Hypertension   Crestor [Rosuvastatin] Other (See Comments)    Muscle aches   Norvasc [Amlodipine Besylate] Swelling and Other (See Comments)    Ankle swelling   Simvastatin Other (See Comments)    Muscle aches, high LFTs   Sulfa Antibiotics Rash   Zetia [Ezetimibe] Other (See Comments)    Muscle aches    Allergies as of 04/20/2023       Reactions   Abaloparatide Other (See Comments)   "Burred vision, lightheaded" (patient does not recall this in 2024)   Mobic  [meloxicam ] Hypertension   Crestor [rosuvastatin] Other (See Comments)   Muscle aches   Norvasc [amlodipine Besylate] Swelling, Other (See Comments)   Ankle swelling   Simvastatin Other (See  Comments)   Muscle aches, high LFTs   Sulfa Antibiotics Rash   Zetia [ezetimibe] Other (See Comments)   Muscle aches        Medication List        Accurate as of April 20, 2023  2:56 PM. If you have any questions, ask your nurse or doctor.          acetaminophen  325 MG tablet Commonly known as: TYLENOL  Take 650 mg by mouth every 4 (four) hours as needed for moderate pain (pain score 4-6).   allopurinol  300 MG tablet Commonly known as: ZYLOPRIM  Take 300 mg by mouth daily.   cloNIDine  0.1 MG tablet Commonly known as: CATAPRES  Take 0.1 mg by mouth daily as needed (for hypertension- if systolic b/p is over 160).   cyanocobalamin  1000 MCG tablet Take 1,000 mcg by mouth daily.   docusate sodium  100 MG capsule Commonly known as: COLACE Take 100 mg by mouth 2 (two) times daily.   famotidine  20 MG tablet Commonly known as: PEPCID  Take 1 tablet (20 mg total) by mouth 2 (two) times daily.   ferrous sulfate  325 (65 FE) MG tablet Take 325 mg by mouth See admin instructions. Take 325 mg by mouth by mouth once daily on Monday, Wednesday, and Friday   hydrALAZINE  100 MG tablet Commonly known as: APRESOLINE  Take 1 tablet (100 mg total) by mouth 3 (three) times daily.   magnesium  oxide 400 (240 Mg) MG tablet Commonly known as: MAG-OX Take 1 tablet (400 mg total) by mouth daily.   meclizine  12.5 MG tablet Commonly known as: ANTIVERT  Take 1 tablet (12.5 mg total) by mouth 3 (three) times daily as needed for dizziness.   pantoprazole  40 MG tablet Commonly known as: PROTONIX  Take 1 tablet (40 mg total) by mouth 2 (two) times daily.   Rivaroxaban  15  MG Tabs tablet Commonly known as: XARELTO  Take 1 tablet (15 mg total) by mouth daily with supper.   senna-docusate 8.6-50 MG tablet Commonly known as: Senokot-S Take 1 tablet by mouth 2 (two) times daily.   sucralfate  1 GM/10ML suspension Commonly known as: Carafate  Take 10 mLs (1 g total) by mouth 4 (four) times daily -   with meals and at bedtime. Give  1 hour before meals and at bedtime on an empt stomach   tamsulosin  0.4 MG Caps capsule Commonly known as: FLOMAX  Take 1 capsule (0.4 mg total) by mouth daily.   Zinc Oxide 10 % Oint Apply 1 Application topically See admin instructions. 1 application to buttocks every shift for redness        Review of Systems  Constitutional:  Positive for fatigue. Negative for appetite change and fever.  HENT:  Negative for congestion, mouth sores and trouble swallowing.   Eyes:  Negative for visual disturbance.  Respiratory:  Negative for cough and shortness of breath.   Cardiovascular:  Negative for leg swelling.  Gastrointestinal:  Negative for abdominal pain, constipation, nausea and vomiting.       GERD symptoms intermittently  Genitourinary:  Negative for difficulty urinating, dysuria, frequency and urgency.       Nocturnal urination 2x average.  Incontinent of urine occasionally   Musculoskeletal:  Positive for arthralgias and gait problem.       R+L knee pain, R+L shoulder limited overhead ROM, c/o pain in the R shoulder, mild.   Skin:  Negative for color change.  Neurological:  Negative for speech difficulty, weakness and headaches.  Psychiatric/Behavioral:  Positive for sleep disturbance. Negative for confusion. The patient is not nervous/anxious.        Sometimes not sleeping, declined sleeping aid.     Immunization History  Administered Date(s) Administered   Influenza Split 11/24/2008   Influenza, High Dose Seasonal PF 03/16/2014, 01/14/2016, 01/21/2017, 02/24/2022   Influenza,inj,Quad PF,6+ Mos 11/20/2010, 02/01/2013   Influenza-Unspecified 11/20/2010, 02/01/2013, 02/19/2018, 02/24/2022   Moderna Sars-Covid-2 Vaccination 04/23/2019, 05/21/2019, 02/20/2020   Pneumococcal Conjugate-13 03/16/2014   Pneumococcal Polysaccharide-23 04/28/2005   Td 04/28/2005   Td (Adult) 04/28/2005   Tdap 06/08/2017   Zoster Recombinant(Shingrix) 01/14/2019    Zoster, Live 07/21/2007   Pertinent  Health Maintenance Due  Topic Date Due   OPHTHALMOLOGY EXAM  Never done   HEMOGLOBIN A1C  04/15/2023   FOOT EXAM  02/20/2024   DEXA SCAN  Completed   INFLUENZA VACCINE  Discontinued      04/12/2019    2:21 PM 07/19/2019    2:20 PM 09/28/2019    8:51 AM 06/21/2020    5:59 PM 09/23/2021    1:10 PM  Fall Risk  Falls in the past year? 0 0     (RETIRED) Patient Fall Risk Level Low fall risk Low fall risk Moderate fall risk Low fall risk Low fall risk  Fall risk Follow up Falls evaluation completed Falls evaluation completed      Functional Status Survey:    Vitals:   04/20/23 1117  BP: (!) 167/72  Pulse: 62  Resp: 20  Temp: 100.1 F (37.8 C)  SpO2: 93%  Weight: 128 lb 3.2 oz (58.2 kg)   Body mass index is 21.33 kg/m. Physical Exam Vitals and nursing note reviewed.  Constitutional:      Comments: fatigue  HENT:     Head: Normocephalic and atraumatic.     Nose: Nose normal.     Mouth/Throat:  Mouth: Mucous membranes are moist.  Eyes:     Extraocular Movements: Extraocular movements intact.     Conjunctiva/sclera: Conjunctivae normal.     Pupils: Pupils are equal, round, and reactive to light.  Cardiovascular:     Rate and Rhythm: Normal rate and regular rhythm.     Heart sounds: No murmur heard. Pulmonary:     Effort: Pulmonary effort is normal.     Breath sounds: Rales present. No wheezing or rhonchi.     Comments: Bibasilar rales.  Abdominal:     Palpations: Abdomen is soft.     Tenderness: There is no abdominal tenderness.  Musculoskeletal:        General: Tenderness present.     Cervical back: Normal range of motion and neck supple.     Right lower leg: No edema.     Left lower leg: No edema.     Comments: R+L shoulder limited overhead ROM, mild R shoulder pain. S/p R+L shoulder replacement.   Skin:    General: Skin is warm and dry.     Comments: skin breakdown R+L buttocks, a dime sized open area left buttock,  suspected the cause of bleeding reported, no active bleeding or s/s of infection, noted non blanchable redness R+L buttocks   Neurological:     General: No focal deficit present.     Mental Status: She is alert and oriented to person, place, and time. Mental status is at baseline.     Gait: Gait abnormal.  Psychiatric:        Mood and Affect: Mood normal.        Behavior: Behavior normal.        Thought Content: Thought content normal.     Labs reviewed: Recent Labs    01/06/23 0926 01/07/23 0425 01/08/23 0413 01/16/23 0000 01/19/23 0000 01/26/23 0718 01/29/23 0000 03/03/23 0000  NA 134* 134* 135   < > 137 135 136* 137  K 3.6 3.6 3.1*   < > 3.4* 4.1 3.8 3.6  CL 106 106 106   < > 105 109 105 104  CO2 22 19* 22  --  23* 21* 24* 23*  GLUCOSE 115* 89 95  --   --  98  --   --   BUN 14 13 13    < > 8 9 8 17   CREATININE 0.62 0.65 0.61   < > 0.5 0.95 0.8 0.8  CALCIUM  9.9 9.2 9.1   < > 8.4* 8.8* 9.6  --   MG 1.9 2.2 2.4  --   --   --   --   --   PHOS 2.0* 2.6 2.2*  --   --   --   --   --    < > = values in this interval not displayed.   Recent Labs    10/13/22 0738 12/24/22 0347 12/28/22 1256 01/01/23 0416 01/08/23 0413 01/16/23 0000 01/19/23 0000 03/03/23 0000  AST 14*  --  17  --  17 11* 10* 12*  ALT 10  --  14  --  11 7 3* 5*  ALKPHOS 56  --  85  --  64 72 79 76  BILITOT 1.2  --  0.8  --  0.6  --   --   --   PROT 5.1*  --  6.5  --  5.6*  --   --   --   ALBUMIN  2.6*   < > 3.0*   < > 2.7* 2.5*  2.6* 3.2*   < > = values in this interval not displayed.   Recent Labs    01/07/23 0425 01/08/23 0413 01/16/23 0000 01/19/23 0000 01/26/23 0718 01/29/23 0000 03/03/23 0000  WBC 6.4 8.8   < > 6.7 6.0 7.4 5.2  NEUTROABS  --  6.5   < > 4,730.00 3.9  --  3,474.00  HGB 8.8* 9.2*   < > 8.0* 7.9* 9.2* 10.0*  HCT 29.3* 29.3*   < > 24* 26.1* 29* 31*  MCV 99.3 96.7  --   --  94.6  --   --   PLT 231 254   < > 324 263 346 258   < > = values in this interval not displayed.   Lab  Results  Component Value Date   TSH 1.499 12/28/2022   Lab Results  Component Value Date   HGBA1C 6.0 (H) 10/13/2022   Lab Results  Component Value Date   CHOL 121 10/13/2022   HDL 44 10/13/2022   LDLCALC 62 10/13/2022   TRIG 74 10/13/2022   CHOLHDL 2.8 10/13/2022    Significant Diagnostic Results in last 30 days:  No results found.  Assessment/Plan: Hypokalemia serum K 2.7 04/17/23, Kcl 40meq every day x2 days, repeated serum K 2.9 04/18/23, improved generalized weakness, pending urine culture Kcl 40meq every day x3 Repeat BMP 04/23/23 The patient stated her appetite and oral intake are at her baseline after a few days of poor oral intake, declined diarrhea or vomiting, GERD symptoms: not better, wants to f/u GI  Reflux esophagitis Stablizing, on Pantoprazole , Famotidine , Sucralfate . Desires f/u GI  IDA (iron deficiency anemia) added Vit B12(Vit B12 273 12/10/22) and Fe 01/15/23. 01/30/23 Cardiology dc'd ASA  Atrial fibrillation (HCC) Heart rate is in control, resumed Rivaroxaban  01/07/23, followed by Cardiology, off Carvedilol  2/2 to bradycardia.   History of urinary retention Stable, taking Tamsulosin .  Essential hypertension Blood pressure is managed, continue Hydralazine  and prn Clonidine .     Family/ staff Communication: plan of care reviewed with the patient and charge nurse.   Labs/tests ordered:  BMP 04/23/23

## 2023-04-21 DIAGNOSIS — R278 Other lack of coordination: Secondary | ICD-10-CM | POA: Diagnosis not present

## 2023-04-21 DIAGNOSIS — R2681 Unsteadiness on feet: Secondary | ICD-10-CM | POA: Diagnosis not present

## 2023-04-21 DIAGNOSIS — M6281 Muscle weakness (generalized): Secondary | ICD-10-CM | POA: Diagnosis not present

## 2023-04-23 DIAGNOSIS — E876 Hypokalemia: Secondary | ICD-10-CM | POA: Diagnosis not present

## 2023-04-23 DIAGNOSIS — R2681 Unsteadiness on feet: Secondary | ICD-10-CM | POA: Diagnosis not present

## 2023-04-23 DIAGNOSIS — R278 Other lack of coordination: Secondary | ICD-10-CM | POA: Diagnosis not present

## 2023-04-23 DIAGNOSIS — M6281 Muscle weakness (generalized): Secondary | ICD-10-CM | POA: Diagnosis not present

## 2023-04-24 DIAGNOSIS — M6281 Muscle weakness (generalized): Secondary | ICD-10-CM | POA: Diagnosis not present

## 2023-04-24 DIAGNOSIS — R2681 Unsteadiness on feet: Secondary | ICD-10-CM | POA: Diagnosis not present

## 2023-04-24 DIAGNOSIS — R278 Other lack of coordination: Secondary | ICD-10-CM | POA: Diagnosis not present

## 2023-04-27 DIAGNOSIS — M6281 Muscle weakness (generalized): Secondary | ICD-10-CM | POA: Diagnosis not present

## 2023-04-27 DIAGNOSIS — R278 Other lack of coordination: Secondary | ICD-10-CM | POA: Diagnosis not present

## 2023-04-27 DIAGNOSIS — R2681 Unsteadiness on feet: Secondary | ICD-10-CM | POA: Diagnosis not present

## 2023-04-28 DIAGNOSIS — E876 Hypokalemia: Secondary | ICD-10-CM | POA: Diagnosis not present

## 2023-04-29 ENCOUNTER — Telehealth: Payer: Self-pay | Admitting: Family

## 2023-04-29 DIAGNOSIS — R278 Other lack of coordination: Secondary | ICD-10-CM | POA: Diagnosis not present

## 2023-04-29 DIAGNOSIS — R2681 Unsteadiness on feet: Secondary | ICD-10-CM | POA: Diagnosis not present

## 2023-04-29 DIAGNOSIS — M6281 Muscle weakness (generalized): Secondary | ICD-10-CM | POA: Diagnosis not present

## 2023-04-29 NOTE — Telephone Encounter (Signed)
[facility] Nurse called to report patient's potassium level 3.0 previous level 2.9.Order given for Potassium chloride 40 meq tablet one by mouth twice daily x 3 days.  Recheck BMP 05/04/2023

## 2023-04-30 DIAGNOSIS — R278 Other lack of coordination: Secondary | ICD-10-CM | POA: Diagnosis not present

## 2023-04-30 DIAGNOSIS — R2681 Unsteadiness on feet: Secondary | ICD-10-CM | POA: Diagnosis not present

## 2023-04-30 DIAGNOSIS — M6281 Muscle weakness (generalized): Secondary | ICD-10-CM | POA: Diagnosis not present

## 2023-05-01 DIAGNOSIS — R2681 Unsteadiness on feet: Secondary | ICD-10-CM | POA: Diagnosis not present

## 2023-05-01 DIAGNOSIS — M6281 Muscle weakness (generalized): Secondary | ICD-10-CM | POA: Diagnosis not present

## 2023-05-01 DIAGNOSIS — R278 Other lack of coordination: Secondary | ICD-10-CM | POA: Diagnosis not present

## 2023-05-04 DIAGNOSIS — M6281 Muscle weakness (generalized): Secondary | ICD-10-CM | POA: Diagnosis not present

## 2023-05-04 DIAGNOSIS — R278 Other lack of coordination: Secondary | ICD-10-CM | POA: Diagnosis not present

## 2023-05-04 DIAGNOSIS — R2681 Unsteadiness on feet: Secondary | ICD-10-CM | POA: Diagnosis not present

## 2023-05-04 DIAGNOSIS — E876 Hypokalemia: Secondary | ICD-10-CM | POA: Diagnosis not present

## 2023-05-06 DIAGNOSIS — M6281 Muscle weakness (generalized): Secondary | ICD-10-CM | POA: Diagnosis not present

## 2023-05-06 DIAGNOSIS — R2681 Unsteadiness on feet: Secondary | ICD-10-CM | POA: Diagnosis not present

## 2023-05-06 DIAGNOSIS — R278 Other lack of coordination: Secondary | ICD-10-CM | POA: Diagnosis not present

## 2023-05-07 DIAGNOSIS — R278 Other lack of coordination: Secondary | ICD-10-CM | POA: Diagnosis not present

## 2023-05-07 DIAGNOSIS — M6281 Muscle weakness (generalized): Secondary | ICD-10-CM | POA: Diagnosis not present

## 2023-05-07 DIAGNOSIS — R2681 Unsteadiness on feet: Secondary | ICD-10-CM | POA: Diagnosis not present

## 2023-05-08 DIAGNOSIS — M6281 Muscle weakness (generalized): Secondary | ICD-10-CM | POA: Diagnosis not present

## 2023-05-08 DIAGNOSIS — R278 Other lack of coordination: Secondary | ICD-10-CM | POA: Diagnosis not present

## 2023-05-08 DIAGNOSIS — R2681 Unsteadiness on feet: Secondary | ICD-10-CM | POA: Diagnosis not present

## 2023-05-10 DIAGNOSIS — G9341 Metabolic encephalopathy: Secondary | ICD-10-CM | POA: Diagnosis not present

## 2023-05-10 DIAGNOSIS — R278 Other lack of coordination: Secondary | ICD-10-CM | POA: Diagnosis not present

## 2023-05-10 DIAGNOSIS — M6281 Muscle weakness (generalized): Secondary | ICD-10-CM | POA: Diagnosis not present

## 2023-05-10 DIAGNOSIS — R2681 Unsteadiness on feet: Secondary | ICD-10-CM | POA: Diagnosis not present

## 2023-05-11 ENCOUNTER — Non-Acute Institutional Stay: Payer: Self-pay | Admitting: Sports Medicine

## 2023-05-11 ENCOUNTER — Encounter: Payer: Self-pay | Admitting: Sports Medicine

## 2023-05-11 DIAGNOSIS — I4891 Unspecified atrial fibrillation: Secondary | ICD-10-CM | POA: Diagnosis not present

## 2023-05-11 DIAGNOSIS — K2100 Gastro-esophageal reflux disease with esophagitis, without bleeding: Secondary | ICD-10-CM

## 2023-05-11 DIAGNOSIS — R300 Dysuria: Secondary | ICD-10-CM

## 2023-05-11 NOTE — Progress Notes (Signed)
 Provider:  Dr. Venita Sheffield Location:  [facility] Place of Service:   Assisted living  PCP: Sigmund Hazel, MD Patient Care Team: Sigmund Hazel, MD as PCP - General (Family Medicine) Tessa Lerner, DO as PCP - Cardiology (Cardiology) Glyn Ade, PA-C as Physician Assistant (Dermatology)  Extended Emergency Contact Information Primary Emergency Contact: [place], [place] [address] [address] Home Phone: [phone] Work Phone: [phone] Mobile Phone: [phone] Relation: Son Secondary Emergency Contact: Derks,[name]  [place] Phone: [phone] Relation: Son  Goals of Care: Advanced Directive information    04/01/2023    3:01 PM  Advanced Directives  Does Patient Have a Medical Advance Directive? Yes  Type of Advance Directive Out of facility DNR (pink MOST or yellow form);Healthcare Power of [place];Living will  Does patient want to make changes to medical advance directive? No - Guardian declined  Copy of Healthcare Power of Attorney in Chart? Yes - validated most recent copy scanned in chart (See row information)  Would patient like information on creating a medical advance directive? No - Patient declined  Pre-existing out of facility DNR order (yellow form or pink MOST form) Yellow form placed in chart (order not valid for inpatient use)       History of Present Illness       83 year old female with past medical history of GERD, anemia, atrial fibrillation is evaluated Is seen today for an acute visit for urinary symptoms. Patient seen and examined in her room.  She is sitting in her recliner chair. Patient complains of dysuria, lower abdominal pain, increased urinary frequency for the past 5 days. Patient reports urgency Denies hematuria, nausea, vomiting, fevers. Patient reports that her appetite has been low for the past few days. She complains of mild low back pain since past  week.      Past Medical History:  Diagnosis Date   CKD (chronic kidney disease), stage II    nephrologist-- dr Kathrene Bongo  [facility])   Diabetes mellitus type 2, diet-controlled (HCC)    followed by pcp---   (09-20-2019  per pt does not check blood sugar at home)   Fracture of humeral shaft, right, closed 04/25/2022   GERD (gastroesophageal reflux disease)    History of primary hyperparathyroidism    s/p  parathyroidectomy right inferior on 12-02-2012   Humerus fracture 04/25/2022   Hyperlipidemia    Hypertension    followd by nephrologist----  (09-20-2019  per pt had stress test done some time ago , unsure when/ where, thinks told ok)   OA (osteoarthritis)    Osteoporosis    Prosthetic joint infection (HCC)    followed by dr j. hatcher (ID)   left total shoulder arthroplasty 12/ 2016  ; 12/ 2020  revision with explant joint replacement and hemiarthroplasty;  completed 6 month antibiotic 05/ 2021   Vertigo    Past Surgical History:  Procedure Laterality Date   COLONOSCOPY     DILATATION & CURETTAGE/HYSTEROSCOPY WITH MYOSURE N/A 09/28/2019   Procedure: DILATATION & CURETTAGE/HYSTEROSCOPY WITH MYOSURE;  Surgeon: Olivia Mackie, MD;  Location: [facility] ;  Service: Gynecology;  Laterality: N/A;   EYE SURGERY  yrs ago   laser surgery for retinal tear.  (unilateral , pt unsure which side)   ORIF HUMERUS FRACTURE Right 04/27/2022   Procedure: OPEN REDUCTION INTERNAL FIXATION (ORIF) PROXIMAL HUMERUS FRACTURE;  Surgeon: Bjorn Pippin, MD;  Location: [facility];  Service:  Orthopedics;  Laterality: Right;   PARATHYROIDECTOMY N/A 12/02/2012   Procedure: PARATHYROIDECTOMY with frozen section ;  Surgeon: Velora Heckler, MD;  Location: [facility];  Service: General;  Laterality: N/A;   REVERSE SHOULDER ARTHROPLASTY Right 04/27/2022   Procedure: REVERSE SHOULDER ARTHROPLASTY;  Surgeon: Bjorn Pippin, MD;  Location: [facility];  Service: Orthopedics;  Laterality: Right;   SHOULDER  INJECTION Left 07/27/2013   Procedure: SHOULDER INJECTION;  Surgeon: Loreta Ave, MD;  Location: [facility] OR;  Service: Orthopedics;  Laterality: Left;   STERIOD INJECTION Right 02/14/2015   Procedure: RIGHT SHOULDER CORTISONE INJECTION;  Surgeon: Loreta Ave, MD;  Location: [facility] OR;  Service: Orthopedics;  Laterality: Right;   TOTAL HIP ARTHROPLASTY Left 07/27/2013   Procedure: TOTAL HIP ARTHROPLASTY ANTERIOR APPROACH;  Surgeon: Loreta Ave, MD;  Location: [facility] OR;  Service: Orthopedics;  Laterality: Left;   TOTAL SHOULDER ARTHROPLASTY Left 02/14/2015   Procedure: LEFT TOTAL SHOULDER ARTHROPLASTY;  Surgeon: Loreta Ave, MD;  Location: [facility] OR;  Service: Orthopedics;  Laterality: Left;   TOTAL SHOULDER REVISION Left 02/09/2019   Procedure: TOTAL SHOULDER REVISION;  Surgeon: Bjorn Pippin, MD;  Location: [facility];  Service: Orthopedics;  Laterality: Left;    reports that she quit smoking about 35 years ago. Her smoking use included cigarettes. She started smoking about 55 years ago. She has a 20 pack-year smoking history. She has never used smokeless tobacco. She reports that she does not drink alcohol and does not use drugs. Social History   Socioeconomic History   Marital status: Widowed    Spouse name: Not on file   Number of children: Not on file   Years of education: Not on file   Highest education level: Not on file  Occupational History   Not on file  Tobacco Use   Smoking status: Former    Current packs/day: 0.00    Average packs/day: 1 pack/day for 20.0 years (20.0 ttl pk-yrs)    Types: Cigarettes    Start date: 10/28/1967    Quit date: 10/28/1987    Years since quitting: 35.5   Smokeless tobacco: Never  Vaping Use   Vaping status: Never Used  Substance and Sexual Activity   Alcohol use: No   Drug use: Never   Sexual activity: Yes    Birth control/protection: Post-menopausal  Other Topics Concern   Not on file  Social History Narrative   Not on file   Social Drivers of  Health   Financial Resource Strain: Not on file  Food Insecurity: No Food Insecurity (12/22/2022)   Hunger Vital Sign    Worried About Running Out of Food in the Last Year: Never true    Ran Out of Food in the Last Year: Never true  Transportation Needs: No Transportation Needs (12/22/2022)   PRAPARE - Administrator, Civil Service (Medical): No    Lack of Transportation (Non-Medical): No  Physical Activity: Not on file  Stress: Not on file  Social Connections: Not on file  Intimate Partner Violence: Not At Risk (12/22/2022)   Humiliation, Afraid, Rape, and Kick questionnaire    Fear of Current or Ex-Partner: No    Emotionally Abused: No    Physically Abused: No    Sexually Abused: No    Functional Status Survey:    Family History  Problem Relation Age of Onset   Heart failure Mother    Cancer Mother    Breast cancer Mother  in her 54s   Cancer Father     Health Maintenance  Topic Date Due   OPHTHALMOLOGY EXAM  Never done   HEMOGLOBIN A1C  04/15/2023   FOOT EXAM  02/20/2024   Medicare Annual Wellness (AWV)  02/20/2024   Diabetic kidney evaluation - Urine ACR  02/26/2024   Diabetic kidney evaluation - eGFR measurement  03/02/2024   DTaP/Tdap/Td (4 - Td or Tdap) 06/09/2027   Pneumonia Vaccine 44+ Years old  Completed   DEXA SCAN  Completed   HPV VACCINES  Aged Out   INFLUENZA VACCINE  Discontinued   COVID-19 Vaccine  Discontinued   Zoster Vaccines- Shingrix  Discontinued    Allergies  Allergen Reactions   Abaloparatide Other (See Comments)    "Burred vision, lightheaded" (patient does not recall this in 2024)   Mobic [Meloxicam] Hypertension   Crestor [Rosuvastatin] Other (See Comments)    Muscle aches   Norvasc [Amlodipine Besylate] Swelling and Other (See Comments)    Ankle swelling   Simvastatin Other (See Comments)    Muscle aches, high LFTs   Sulfa Antibiotics Rash   Zetia [Ezetimibe] Other (See Comments)    Muscle aches     Outpatient Encounter Medications as of 05/11/2023  Medication Sig   acetaminophen (TYLENOL) 325 MG tablet Take 650 mg by mouth every 4 (four) hours as needed for moderate pain (pain score 4-6).   allopurinol (ZYLOPRIM) 300 MG tablet Take 300 mg by mouth daily.   cloNIDine (CATAPRES) 0.1 MG tablet Take 0.1 mg by mouth daily as needed (for hypertension- if systolic b/p is over 160).   cyanocobalamin 1000 MCG tablet Take 1,000 mcg by mouth daily.   docusate sodium (COLACE) 100 MG capsule Take 100 mg by mouth 2 (two) times daily.   famotidine (PEPCID) 20 MG tablet Take 1 tablet (20 mg total) by mouth 2 (two) times daily.   ferrous sulfate 325 (65 FE) MG tablet Take 325 mg by mouth See admin instructions. Take 325 mg by mouth by mouth once daily on Monday, Wednesday, and Friday   hydrALAZINE (APRESOLINE) 100 MG tablet Take 1 tablet (100 mg total) by mouth 3 (three) times daily.   magnesium oxide (MAG-OX) 400 (240 Mg) MG tablet Take 1 tablet (400 mg total) by mouth daily.   meclizine (ANTIVERT) 12.5 MG tablet Take 1 tablet (12.5 mg total) by mouth 3 (three) times daily as needed for dizziness.   pantoprazole (PROTONIX) 40 MG tablet Take 1 tablet (40 mg total) by mouth 2 (two) times daily.   Rivaroxaban (XARELTO) 15 MG TABS tablet Take 1 tablet (15 mg total) by mouth daily with supper.   senna-docusate (SENOKOT-S) 8.6-50 MG tablet Take 1 tablet by mouth 2 (two) times daily. (Patient not taking: Reported on 04/01/2023)   sucralfate (CARAFATE) 1 GM/10ML suspension Take 10 mLs (1 g total) by mouth 4 (four) times daily -  with meals and at bedtime. Give  1 hour before meals and at bedtime on an empt stomach   tamsulosin (FLOMAX) 0.4 MG CAPS capsule Take 1 capsule (0.4 mg total) by mouth daily.   Zinc Oxide 10 % OINT Apply 1 Application topically See admin instructions. 1 application to buttocks every shift for redness   No facility-administered encounter medications on file as of 05/11/2023.    Review of  Systems Negative unless indicated in HPI.  There were no vitals filed for this visit. There is no height or weight on file to calculate BMI. BP Readings from Last  3 Encounters:  04/20/23 (!) 167/72  04/17/23 (!) 149/70  04/03/23 136/60   Wt Readings from Last 3 Encounters:  04/20/23 128 lb 3.2 oz (58.2 kg)  04/17/23 128 lb 3.2 oz (58.2 kg)  04/03/23 129 lb (58.5 kg)   Physical Exam  Labs reviewed: Basic Metabolic Panel: Recent Labs    01/06/23 0926 01/07/23 0425 01/08/23 0413 01/16/23 0000 01/19/23 0000 01/26/23 0718 01/29/23 0000 03/03/23 0000  NA 134* 134* 135   < > 137 135 136* 137  K 3.6 3.6 3.1*   < > 3.4* 4.1 3.8 3.6  CL 106 106 106   < > 105 109 105 104  CO2 22 19* 22  --  23* 21* 24* 23*  GLUCOSE 115* 89 95  --   --  98  --   --   BUN 14 13 13    < > 8 9 8 17   CREATININE 0.62 0.65 0.61   < > 0.5 0.95 0.8 0.8  CALCIUM 9.9 9.2 9.1   < > 8.4* 8.8* 9.6  --   MG 1.9 2.2 2.4  --   --   --   --   --   PHOS 2.0* 2.6 2.2*  --   --   --   --   --    < > = values in this interval not displayed.   Liver Function Tests: Recent Labs    10/13/22 0738 12/24/22 0347 12/28/22 1256 01/01/23 0416 01/08/23 0413 01/16/23 0000 01/19/23 0000 03/03/23 0000  AST 14*  --  17  --  17 11* 10* 12*  ALT 10  --  14  --  11 7 3* 5*  ALKPHOS 56  --  85  --  64 72 79 76  BILITOT 1.2  --  0.8  --  0.6  --   --   --   PROT 5.1*  --  6.5  --  5.6*  --   --   --   ALBUMIN 2.6*   < > 3.0*   < > 2.7* 2.5* 2.6* 3.2*   < > = values in this interval not displayed.   Recent Labs    10/12/22 2013  LIPASE 26   Recent Labs    10/12/22 2104 12/28/22 1256  AMMONIA <10 <10   CBC: Recent Labs    01/07/23 0425 01/08/23 0413 01/16/23 0000 01/19/23 0000 01/26/23 0718 01/29/23 0000 03/03/23 0000  WBC 6.4 8.8   < > 6.7 6.0 7.4 5.2  NEUTROABS  --  6.5   < > 4,730.00 3.9  --  3,474.00  HGB 8.8* 9.2*   < > 8.0* 7.9* 9.2* 10.0*  HCT 29.3* 29.3*   < > 24* 26.1* 29* 31*  MCV 99.3 96.7   --   --  94.6  --   --   PLT 231 254   < > 324 263 346 258   < > = values in this interval not displayed.   Cardiac Enzymes: No results for input(s): "CKTOTAL", "CKMB", "CKMBINDEX", "TROPONINI" in the last 8760 hours. BNP: Invalid input(s): "POCBNP" Lab Results  Component Value Date   HGBA1C 6.0 (H) 10/13/2022   Lab Results  Component Value Date   TSH 1.499 12/28/2022   Lab Results  Component Value Date   VITAMINB12 359 01/19/2023   Lab Results  Component Value Date   FOLATE 11.7 01/03/2023   Lab Results  Component Value Date   IRON 16 01/19/2023   TIBC  155 01/19/2023   FERRITIN 88 01/19/2023    Imaging and Procedures obtained prior to SNF admission: No results found.  Assessment and Plan      1. Dysuria (Primary) Pt c/o dysuria, frequency, lower abdominal pain  Will check UA, culture  2. Atrial fibrillation, unspecified type (HCC) Rate controlled No signs of bleeding Cont with xarelto  3. Gastroesophageal reflux disease with esophagitis, unspecified whether hemorrhage Stable Cont with protonix, pepcid, carafate      30 min Total time spent for obtaining history,  performing a medically appropriate examination and evaluation, reviewing the tests  documenting clinical information in the electronic or other health record,care coordination (not separately reported)

## 2023-05-12 DIAGNOSIS — N179 Acute kidney failure, unspecified: Secondary | ICD-10-CM | POA: Diagnosis not present

## 2023-05-13 ENCOUNTER — Ambulatory Visit (HOSPITAL_COMMUNITY): Admission: RE | Admit: 2023-05-13 | Payer: Medicare PPO | Source: Ambulatory Visit

## 2023-05-13 DIAGNOSIS — G9341 Metabolic encephalopathy: Secondary | ICD-10-CM | POA: Diagnosis not present

## 2023-05-13 DIAGNOSIS — M6281 Muscle weakness (generalized): Secondary | ICD-10-CM | POA: Diagnosis not present

## 2023-05-13 DIAGNOSIS — R2681 Unsteadiness on feet: Secondary | ICD-10-CM | POA: Diagnosis not present

## 2023-05-13 DIAGNOSIS — R278 Other lack of coordination: Secondary | ICD-10-CM | POA: Diagnosis not present

## 2023-05-14 ENCOUNTER — Ambulatory Visit: Payer: Medicare PPO | Admitting: Podiatry

## 2023-05-14 DIAGNOSIS — I10 Essential (primary) hypertension: Secondary | ICD-10-CM | POA: Diagnosis not present

## 2023-05-14 DIAGNOSIS — G9341 Metabolic encephalopathy: Secondary | ICD-10-CM | POA: Diagnosis not present

## 2023-05-14 DIAGNOSIS — M6281 Muscle weakness (generalized): Secondary | ICD-10-CM | POA: Diagnosis not present

## 2023-05-14 DIAGNOSIS — E876 Hypokalemia: Secondary | ICD-10-CM | POA: Diagnosis not present

## 2023-05-14 DIAGNOSIS — R2681 Unsteadiness on feet: Secondary | ICD-10-CM | POA: Diagnosis not present

## 2023-05-14 DIAGNOSIS — R278 Other lack of coordination: Secondary | ICD-10-CM | POA: Diagnosis not present

## 2023-05-18 ENCOUNTER — Encounter: Payer: Self-pay | Admitting: Nurse Practitioner

## 2023-05-18 ENCOUNTER — Non-Acute Institutional Stay: Payer: Self-pay | Admitting: Nurse Practitioner

## 2023-05-18 DIAGNOSIS — G9341 Metabolic encephalopathy: Secondary | ICD-10-CM | POA: Diagnosis not present

## 2023-05-18 DIAGNOSIS — D509 Iron deficiency anemia, unspecified: Secondary | ICD-10-CM | POA: Diagnosis not present

## 2023-05-18 DIAGNOSIS — R300 Dysuria: Secondary | ICD-10-CM | POA: Diagnosis not present

## 2023-05-18 DIAGNOSIS — I10 Essential (primary) hypertension: Secondary | ICD-10-CM | POA: Diagnosis not present

## 2023-05-18 DIAGNOSIS — R2681 Unsteadiness on feet: Secondary | ICD-10-CM | POA: Diagnosis not present

## 2023-05-18 DIAGNOSIS — I4891 Unspecified atrial fibrillation: Secondary | ICD-10-CM | POA: Diagnosis not present

## 2023-05-18 DIAGNOSIS — K219 Gastro-esophageal reflux disease without esophagitis: Secondary | ICD-10-CM

## 2023-05-18 DIAGNOSIS — E876 Hypokalemia: Secondary | ICD-10-CM | POA: Diagnosis not present

## 2023-05-18 DIAGNOSIS — Z87898 Personal history of other specified conditions: Secondary | ICD-10-CM | POA: Diagnosis not present

## 2023-05-18 DIAGNOSIS — R278 Other lack of coordination: Secondary | ICD-10-CM | POA: Diagnosis not present

## 2023-05-18 DIAGNOSIS — M6281 Muscle weakness (generalized): Secondary | ICD-10-CM | POA: Diagnosis not present

## 2023-05-18 NOTE — Assessment & Plan Note (Addendum)
 c/o burning sensation on urination, increased frequency and incontinence. Denied lower abd/back pain/pressure, she is afebrile.  Repeat UA C/S if needed, last Urine culture 05/11/23 no growth.  Try Pyridium 100mg  tid x 2 days po.

## 2023-05-18 NOTE — Assessment & Plan Note (Signed)
 added Vit B12(Vit B12 273 12/10/22) and Fe 01/15/23. 01/30/23 Cardiology dc'd ASA

## 2023-05-18 NOTE — Assessment & Plan Note (Signed)
 Blood pressure is controlled, on Hydralazine,  prn Clonidine

## 2023-05-18 NOTE — Assessment & Plan Note (Signed)
 Not controlled on  Voquezna per GI 03/27/23  03/27/23 GI dc'd Pantoprazole, Famotidine, Carafage 04/01/23 f/u GI, dc'd Voquenza, restarted Pantoprazole, Famotidine, Sucralfate.

## 2023-05-18 NOTE — Assessment & Plan Note (Signed)
 serum K 2.7 04/17/23<<3.5 05/14/23, on  Kcl supplement

## 2023-05-18 NOTE — Assessment & Plan Note (Signed)
 Resumed Xarelto, followed by cardiology, heart rate is in control.

## 2023-05-18 NOTE — Assessment & Plan Note (Signed)
 Hx of urinary retention, taking Tamsulosin.

## 2023-05-18 NOTE — Progress Notes (Unsigned)
 Location:   [facility] Room Number: 807 Place of Service:  AL[facility] (13) Provider: Arna Snipe Disney Ruggiero NP  Sigmund Hazel, MD  Patient Care Team: Sigmund Hazel, MD as PCP - General (Family Medicine) Tessa Lerner, DO as PCP - Cardiology (Cardiology) Suzi Roots as Physician Assistant (Dermatology)  Extended Emergency Contact Information Primary Emergency Contact: [name], [place] [address] [place] Home Phone: [phone] Work Phone: [phone] Mobile Phone: [phone] Relation: Son Secondary Emergency Contact: Haugen,[name]  [place] Phone: [phone] Relation: Son  Code Status: DNR Goals of care: Advanced Directive information    04/01/2023    3:01 PM  Advanced Directives  Does Patient Have a Medical Advance Directive? Yes  Type of Advance Directive Out of facility DNR (pink MOST or yellow form);Healthcare Power of [place];Living will  Does patient want to make changes to medical advance directive? No - Guardian declined  Copy of Healthcare Power of Attorney in Chart? Yes - validated most recent copy scanned in chart (See row information)  Would patient like information on creating a medical advance directive? No - Patient declined  Pre-existing out of facility DNR order (yellow form or pink MOST form) Yellow form placed in chart (order not valid for inpatient use)     Chief Complaint  Patient presents with  . Acute Visit    C/o burning sensation on urination, increased frequency and incontinence    HPI:  Pt is a 83 y.o. female seen today for an acute visit for c/o burning sensation on urination, increased frequency and incontinence. Denied lower abd/back pain/pressure, she is afebrile.   Hypokalemia, serum K 2.7 04/17/23<<3.5 05/14/23, on  Kcl supplement              GERD, on  Voquezna per GI 03/27/23  03/27/23 GI dc'd Pantoprazole, Famotidine, Carafage 04/01/23 f/u GI, dc'd Voquenza, restarted  Pantoprazole, Famotidine, Sucralfate.               Anemia, added Vit B12(Vit B12 273 12/10/22) and Fe 01/15/23. 01/30/23 Cardiology dc'd ASA             New onset Atrial Fibrillation -2D echo from 10/13/2022 with a EF of 60 to 65%, grade 1 DD and mildly dilated left atrium. -Patient with history of bradycardia arrhythmia for which she was sent to cardiology in May 2024. -Patient felt not a great candidate for long-term anticoagulation but fall risk now is lower due to debility per son. -Patient placed on Xarelto which is preferred medication per family. -Patient has been placed on full dose Lovenox in anticipation of bone marrow biopsy which was done 01/06/2023.   -Resuming Rivaroxaban 01/07/2023. 03/11/22 cardiology H&H, BMP q 6months              Saw oncology 01/19/23 MGUS, negative cone marrow biopsy, moderate risk of progression to IgM myeloma or [place] macroglobulinemia(19-37% in next 20 years), f/u.              Hydronephrosis: f/u urology for renal mass left 1.9x1.6cm             Hospitalized 12/22/22 for metabolic encephalopathy 2/2 to UTI, AKI, and hypercalcemia.              Dysphagia, f/u ST             Hospitalized 10/12/22-10/17/22 UTI, encephalopathy, acute CVA  OA R+L knee pain, f/u Ortho for Gel inj, ambulates with walker.              CVA resolved difficulty word finding, off Statin, ASA, f/u stroke clinic, MRI small punctate infarct in the posteerior limb of R internal capsule, no focal weakness             Gout, stable, takes Allopurinol             HLD, the patient desires to resume Atorvastatin and denied any allergic reaction, LDL 62 10/13/22, LDL 69 04/02/23, f/u lipid panel 6 months.              HTN, on Hydralazine,  prn Clonidine             CXR 10/14/22 pulm edema/volume overload, not apparent, off prn Furosemide, BNP 950.8 10/14/22, Echo 60-65% EF 10/13/22             Constipation, stable, diet controlled             Prediabetes Hgb A1c 6.0 10/13/22, TSH 1.293 10/12/22, ACR  242(<30)02/26/23             Positive anti CCP test, f/u Rheumatology              Bradycardia, Hx of, off Carvedilol              Hx of urinary retention, taking Tamsulosin.       Past Medical History:  Diagnosis Date  . CKD (chronic kidney disease), stage II    nephrologist-- dr Kathrene Bongo  [facility])  . Diabetes mellitus type 2, diet-controlled (HCC)    followed by pcp---   (09-20-2019  per pt does not check blood sugar at home)  . Fracture of humeral shaft, right, closed 04/25/2022  . GERD (gastroesophageal reflux disease)   . History of primary hyperparathyroidism    s/p  parathyroidectomy right inferior on 12-02-2012  . Humerus fracture 04/25/2022  . Hyperlipidemia   . Hypertension    followd by nephrologist----  (09-20-2019  per pt had stress test done some time ago , unsure when/ where, thinks told ok)  . OA (osteoarthritis)   . Osteoporosis   . Prosthetic joint infection (HCC)    followed by dr j. hatcher (ID)   left total shoulder arthroplasty 12/ 2016  ; 12/ 2020  revision with explant joint replacement and hemiarthroplasty;  completed 6 month antibiotic 05/ 2021  . Vertigo    Past Surgical History:  Procedure Laterality Date  . COLONOSCOPY    . DILATATION & CURETTAGE/HYSTEROSCOPY WITH MYOSURE N/A 09/28/2019   Procedure: DILATATION & CURETTAGE/HYSTEROSCOPY WITH MYOSURE;  Surgeon: Olivia Mackie, MD;  Location: [facility] [place];  Service: Gynecology;  Laterality: N/A;  . EYE SURGERY  yrs ago   laser surgery for retinal tear.  (unilateral , pt unsure which side)  . ORIF HUMERUS FRACTURE Right 04/27/2022   Procedure: OPEN REDUCTION INTERNAL FIXATION (ORIF) PROXIMAL HUMERUS FRACTURE;  Surgeon: Bjorn Pippin, MD;  Location: [facility];  Service: Orthopedics;  Laterality: Right;  . PARATHYROIDECTOMY N/A 12/02/2012   Procedure: PARATHYROIDECTOMY with frozen section ;  Surgeon: Velora Heckler, MD;  Location: [facility];  Service: General;  Laterality: N/A;  . REVERSE  SHOULDER ARTHROPLASTY Right 04/27/2022   Procedure: REVERSE SHOULDER ARTHROPLASTY;  Surgeon: Bjorn Pippin, MD;  Location: [facility];  Service: Orthopedics;  Laterality: Right;  . SHOULDER INJECTION Left 07/27/2013   Procedure: SHOULDER INJECTION;  Surgeon: Reuel Boom  Georg Ruddle, MD;  Location: [facility];  Service: Orthopedics;  Laterality: Left;  . STERIOD INJECTION Right 02/14/2015   Procedure: RIGHT SHOULDER CORTISONE INJECTION;  Surgeon: Loreta Ave, MD;  Location: [facility] OR;  Service: Orthopedics;  Laterality: Right;  . TOTAL HIP ARTHROPLASTY Left 07/27/2013   Procedure: TOTAL HIP ARTHROPLASTY ANTERIOR APPROACH;  Surgeon: Loreta Ave, MD;  Location: [facility] OR;  Service: Orthopedics;  Laterality: Left;  . TOTAL SHOULDER ARTHROPLASTY Left 02/14/2015   Procedure: LEFT TOTAL SHOULDER ARTHROPLASTY;  Surgeon: Loreta Ave, MD;  Location: [facility] OR;  Service: Orthopedics;  Laterality: Left;  . TOTAL SHOULDER REVISION Left 02/09/2019   Procedure: TOTAL SHOULDER REVISION;  Surgeon: Bjorn Pippin, MD;  Location: [facility];  Service: Orthopedics;  Laterality: Left;    Allergies  Allergen Reactions  . Abaloparatide Other (See Comments)    "Burred vision, lightheaded" (patient does not recall this in 2024)  . Mobic [Meloxicam] Hypertension  . Crestor [Rosuvastatin] Other (See Comments)    Muscle aches  . Norvasc [Amlodipine Besylate] Swelling and Other (See Comments)    Ankle swelling  . Simvastatin Other (See Comments)    Muscle aches, high LFTs  . Sulfa Antibiotics Rash  . Zetia [Ezetimibe] Other (See Comments)    Muscle aches    Allergies as of 05/18/2023       Reactions   Abaloparatide Other (See Comments)   "Burred vision, lightheaded" (patient does not recall this in 2024)   Mobic [meloxicam] Hypertension   Crestor [rosuvastatin] Other (See Comments)   Muscle aches   Norvasc [amlodipine Besylate] Swelling, Other (See Comments)   Ankle swelling   Simvastatin Other (See Comments)   Muscle aches, high  LFTs   Sulfa Antibiotics Rash   Zetia [ezetimibe] Other (See Comments)   Muscle aches        Medication List        Accurate as of May 18, 2023 11:59 PM. If you have any questions, ask your nurse or doctor.          acetaminophen 325 MG tablet Commonly known as: TYLENOL Take 650 mg by mouth every 4 (four) hours as needed for moderate pain (pain score 4-6).   allopurinol 300 MG tablet Commonly known as: ZYLOPRIM Take 300 mg by mouth daily.   cloNIDine 0.1 MG tablet Commonly known as: CATAPRES Take 0.1 mg by mouth daily as needed (for hypertension- if systolic b/p is over 160).   cyanocobalamin 1000 MCG tablet Take 1,000 mcg by mouth daily.   docusate sodium 100 MG capsule Commonly known as: COLACE Take 100 mg by mouth 2 (two) times daily.   famotidine 20 MG tablet Commonly known as: PEPCID Take 1 tablet (20 mg total) by mouth 2 (two) times daily.   ferrous sulfate 325 (65 FE) MG tablet Take 325 mg by mouth See admin instructions. Take 325 mg by mouth by mouth once daily on Monday, Wednesday, and Friday   hydrALAZINE 100 MG tablet Commonly known as: APRESOLINE Take 1 tablet (100 mg total) by mouth 3 (three) times daily.   magnesium oxide 400 (240 Mg) MG tablet Commonly known as: MAG-OX Take 1 tablet (400 mg total) by mouth daily.   meclizine 12.5 MG tablet Commonly known as: ANTIVERT Take 1 tablet (12.5 mg total) by mouth 3 (three) times daily as needed for dizziness.   pantoprazole 40 MG tablet Commonly known as: PROTONIX Take 1 tablet (40 mg total) by mouth 2 (two) times daily.   Rivaroxaban  15 MG Tabs tablet Commonly known as: XARELTO Take 1 tablet (15 mg total) by mouth daily with supper.   senna-docusate 8.6-50 MG tablet Commonly known as: Senokot-S Take 1 tablet by mouth 2 (two) times daily.   sucralfate 1 GM/10ML suspension Commonly known as: Carafate Take 10 mLs (1 g total) by mouth 4 (four) times daily -  with meals and at bedtime. Give   1 hour before meals and at bedtime on an empt stomach   tamsulosin 0.4 MG Caps capsule Commonly known as: FLOMAX Take 1 capsule (0.4 mg total) by mouth daily.   Zinc Oxide 10 % Oint Apply 1 Application topically See admin instructions. 1 application to buttocks every shift for redness        Review of Systems  Constitutional:  Positive for fatigue. Negative for appetite change and fever.  HENT:  Negative for congestion, mouth sores and trouble swallowing.   Eyes:  Negative for visual disturbance.  Respiratory:  Negative for cough and shortness of breath.   Cardiovascular:  Negative for leg swelling.  Gastrointestinal:  Negative for abdominal pain, constipation, nausea and vomiting.       GERD symptoms intermittently  Genitourinary:  Positive for dysuria and frequency. Negative for difficulty urinating and urgency.       Nocturnal urination 2x average.  Incontinent of urine occasionally   Musculoskeletal:  Positive for arthralgias and gait problem.       R+L knee pain, R+L shoulder limited overhead ROM, c/o pain in the R shoulder, mild.   Skin:  Negative for color change.  Neurological:  Negative for speech difficulty, weakness and headaches.  Psychiatric/Behavioral:  Positive for sleep disturbance. Negative for confusion. The patient is not nervous/anxious.        Sometimes not sleeping, declined sleeping aid.     Immunization History  Administered Date(s) Administered  . Influenza Split 11/24/2008  . Influenza, High Dose Seasonal PF 03/16/2014, 01/14/2016, 01/21/2017, 02/24/2022  . Influenza,inj,Quad PF,6+ Mos 11/20/2010, 02/01/2013  . Influenza-Unspecified 11/20/2010, 02/01/2013, 02/19/2018, 02/24/2022  . Moderna Sars-Covid-2 Vaccination 04/23/2019, 05/21/2019, 02/20/2020  . Pneumococcal Conjugate-13 03/16/2014  . Pneumococcal Polysaccharide-23 04/28/2005  . Td 04/28/2005  . Td (Adult) 04/28/2005  . Tdap 06/08/2017  . Zoster Recombinant(Shingrix) 01/14/2019  . Zoster,  Live 07/21/2007   Pertinent  Health Maintenance Due  Topic Date Due  . OPHTHALMOLOGY EXAM  Never done  . HEMOGLOBIN A1C  04/15/2023  . FOOT EXAM  02/20/2024  . DEXA SCAN  Completed  . INFLUENZA VACCINE  Discontinued      04/12/2019    2:21 PM 07/19/2019    2:20 PM 09/28/2019    8:51 AM 06/21/2020    5:59 PM 09/23/2021    1:10 PM  Fall Risk  Falls in the past year? 0 0     (RETIRED) Patient Fall Risk Level Low fall risk Low fall risk Moderate fall risk Low fall risk Low fall risk  Fall risk Follow up Falls evaluation completed Falls evaluation completed      Functional Status Survey:    Vitals:   05/18/23 1314  BP: 135/75  Pulse: 77  Resp: 17  Temp: (!) 97 F (36.1 C)  SpO2: 98%  Weight: 120 lb 3.2 oz (54.5 kg)   Body mass index is 20 kg/m. Physical Exam Vitals and nursing note reviewed.  Constitutional:      Comments: fatigue  HENT:     Head: Normocephalic and atraumatic.     Nose: Nose normal.  Mouth/Throat:     Mouth: Mucous membranes are moist.  Eyes:     Extraocular Movements: Extraocular movements intact.     Conjunctiva/sclera: Conjunctivae normal.     Pupils: Pupils are equal, round, and reactive to light.  Cardiovascular:     Rate and Rhythm: Normal rate and regular rhythm.     Heart sounds: No murmur heard. Pulmonary:     Effort: Pulmonary effort is normal.     Breath sounds: Rales present. No wheezing or rhonchi.     Comments: Bibasilar rales.  Abdominal:     General: There is no distension.     Palpations: Abdomen is soft.     Tenderness: There is no abdominal tenderness. There is no right CVA tenderness, left CVA tenderness, guarding or rebound.  Musculoskeletal:        General: Tenderness present.     Cervical back: Normal range of motion and neck supple.     Right lower leg: No edema.     Left lower leg: No edema.     Comments: R+L shoulder limited overhead ROM, mild R shoulder pain. S/p R+L shoulder replacement.   Skin:    General: Skin  is warm and dry.     Comments: skin breakdown R+L buttocks, a dime sized open area left buttock, suspected the cause of bleeding reported, no active bleeding or s/s of infection, noted non blanchable redness R+L buttocks   Neurological:     General: No focal deficit present.     Mental Status: She is alert and oriented to person, place, and time. Mental status is at baseline.     Gait: Gait abnormal.  Psychiatric:        Mood and Affect: Mood normal.        Behavior: Behavior normal.        Thought Content: Thought content normal.    Labs reviewed: Recent Labs    01/06/23 0926 01/07/23 0425 01/08/23 0413 01/16/23 0000 01/19/23 0000 01/26/23 0718 01/29/23 0000 03/03/23 0000  NA 134* 134* 135   < > 137 135 136* 137  K 3.6 3.6 3.1*   < > 3.4* 4.1 3.8 3.6  CL 106 106 106   < > 105 109 105 104  CO2 22 19* 22  --  23* 21* 24* 23*  GLUCOSE 115* 89 95  --   --  98  --   --   BUN 14 13 13    < > 8 9 8 17   CREATININE 0.62 0.65 0.61   < > 0.5 0.95 0.8 0.8  CALCIUM 9.9 9.2 9.1   < > 8.4* 8.8* 9.6  --   MG 1.9 2.2 2.4  --   --   --   --   --   PHOS 2.0* 2.6 2.2*  --   --   --   --   --    < > = values in this interval not displayed.   Recent Labs    10/13/22 0738 12/24/22 0347 12/28/22 1256 01/01/23 0416 01/08/23 0413 01/16/23 0000 01/19/23 0000 03/03/23 0000  AST 14*  --  17  --  17 11* 10* 12*  ALT 10  --  14  --  11 7 3* 5*  ALKPHOS 56  --  85  --  64 72 79 76  BILITOT 1.2  --  0.8  --  0.6  --   --   --   PROT 5.1*  --  6.5  --  5.6*  --   --   --   ALBUMIN 2.6*   < > 3.0*   < > 2.7* 2.5* 2.6* 3.2*   < > = values in this interval not displayed.   Recent Labs    01/07/23 0425 01/08/23 0413 01/16/23 0000 01/19/23 0000 01/26/23 0718 01/29/23 0000 03/03/23 0000  WBC 6.4 8.8   < > 6.7 6.0 7.4 5.2  NEUTROABS  --  6.5   < > 4,730.00 3.9  --  3,474.00  HGB 8.8* 9.2*   < > 8.0* 7.9* 9.2* 10.0*  HCT 29.3* 29.3*   < > 24* 26.1* 29* 31*  MCV 99.3 96.7  --   --  94.6  --    --   PLT 231 254   < > 324 263 346 258   < > = values in this interval not displayed.   Lab Results  Component Value Date   TSH 1.499 12/28/2022   Lab Results  Component Value Date   HGBA1C 6.0 (H) 10/13/2022   Lab Results  Component Value Date   CHOL 121 10/13/2022   HDL 44 10/13/2022   LDLCALC 62 10/13/2022   TRIG 74 10/13/2022   CHOLHDL 2.8 10/13/2022    Significant Diagnostic Results in last 30 days:  No results found.  Assessment/Plan: Dysuria  c/o burning sensation on urination, increased frequency and incontinence. Denied lower abd/back pain/pressure, she is afebrile.  Repeat UA C/S if needed, last Urine culture 05/11/23 no growth.  Try Pyridium 100mg  tid x 2 days po.   Hypokalemia serum K 2.7 04/17/23<<3.5 05/14/23, on  Kcl supplement  Gastroesophageal reflux disease Not controlled on  Voquezna per GI 03/27/23  03/27/23 GI dc'd Pantoprazole, Famotidine, Carafage 04/01/23 f/u GI, dc'd Voquenza, restarted Pantoprazole, Famotidine, Sucralfate.   IDA (iron deficiency anemia)  added Vit B12(Vit B12 273 12/10/22) and Fe 01/15/23. 01/30/23 Cardiology dc'd ASA  Atrial fibrillation (HCC) Resumed Xarelto, followed by cardiology, heart rate is in control.   Essential hypertension Blood pressure is controlled, on Hydralazine,  prn Clonidine  History of urinary retention   Hx of urinary retention, taking Tamsulosin.    Family/ staff Communication: plan of care reviewed with the patient and charge nurse.   Labs/tests ordered:  may repeat UA C/S

## 2023-05-19 ENCOUNTER — Ambulatory Visit
Admission: RE | Admit: 2023-05-19 | Discharge: 2023-05-19 | Disposition: A | Discharge status: Home / Self Care | Payer: Medicare PPO | Source: Ambulatory Visit | Attending: Gastroenterology | Admitting: Gastroenterology

## 2023-05-19 ENCOUNTER — Other Ambulatory Visit: Payer: Self-pay | Admitting: Gastroenterology

## 2023-05-19 DIAGNOSIS — K2100 Gastro-esophageal reflux disease with esophagitis, without bleeding: Secondary | ICD-10-CM

## 2023-05-19 DIAGNOSIS — K449 Diaphragmatic hernia without obstruction or gangrene: Secondary | ICD-10-CM | POA: Diagnosis not present

## 2023-05-20 ENCOUNTER — Ambulatory Visit (HOSPITAL_COMMUNITY)
Admission: RE | Admit: 2023-05-20 | Discharge: 2023-05-20 | Disposition: A | Discharge status: Home / Self Care | Source: Ambulatory Visit | Attending: Hematology

## 2023-05-20 DIAGNOSIS — R2689 Other abnormalities of gait and mobility: Secondary | ICD-10-CM | POA: Diagnosis not present

## 2023-05-20 DIAGNOSIS — Z96612 Presence of left artificial shoulder joint: Secondary | ICD-10-CM | POA: Diagnosis present

## 2023-05-20 DIAGNOSIS — Z87440 Personal history of urinary (tract) infections: Secondary | ICD-10-CM | POA: Diagnosis not present

## 2023-05-20 DIAGNOSIS — N179 Acute kidney failure, unspecified: Secondary | ICD-10-CM | POA: Diagnosis present

## 2023-05-20 DIAGNOSIS — M109 Gout, unspecified: Secondary | ICD-10-CM | POA: Diagnosis not present

## 2023-05-20 DIAGNOSIS — R410 Disorientation, unspecified: Secondary | ICD-10-CM | POA: Diagnosis not present

## 2023-05-20 DIAGNOSIS — R0689 Other abnormalities of breathing: Secondary | ICD-10-CM | POA: Diagnosis not present

## 2023-05-20 DIAGNOSIS — Z7401 Bed confinement status: Secondary | ICD-10-CM | POA: Diagnosis not present

## 2023-05-20 DIAGNOSIS — I480 Paroxysmal atrial fibrillation: Secondary | ICD-10-CM | POA: Diagnosis present

## 2023-05-20 DIAGNOSIS — D631 Anemia in chronic kidney disease: Secondary | ICD-10-CM | POA: Diagnosis present

## 2023-05-20 DIAGNOSIS — I10 Essential (primary) hypertension: Secondary | ICD-10-CM | POA: Diagnosis not present

## 2023-05-20 DIAGNOSIS — E1122 Type 2 diabetes mellitus with diabetic chronic kidney disease: Secondary | ICD-10-CM | POA: Diagnosis present

## 2023-05-20 DIAGNOSIS — N2889 Other specified disorders of kidney and ureter: Secondary | ICD-10-CM | POA: Insufficient documentation

## 2023-05-20 DIAGNOSIS — I130 Hypertensive heart and chronic kidney disease with heart failure and stage 1 through stage 4 chronic kidney disease, or unspecified chronic kidney disease: Secondary | ICD-10-CM | POA: Diagnosis present

## 2023-05-20 DIAGNOSIS — D509 Iron deficiency anemia, unspecified: Secondary | ICD-10-CM | POA: Diagnosis not present

## 2023-05-20 DIAGNOSIS — M810 Age-related osteoporosis without current pathological fracture: Secondary | ICD-10-CM | POA: Diagnosis present

## 2023-05-20 DIAGNOSIS — Z8249 Family history of ischemic heart disease and other diseases of the circulatory system: Secondary | ICD-10-CM | POA: Diagnosis not present

## 2023-05-20 DIAGNOSIS — I6782 Cerebral ischemia: Secondary | ICD-10-CM | POA: Diagnosis not present

## 2023-05-20 DIAGNOSIS — R9082 White matter disease, unspecified: Secondary | ICD-10-CM | POA: Diagnosis not present

## 2023-05-20 DIAGNOSIS — R300 Dysuria: Secondary | ICD-10-CM | POA: Diagnosis not present

## 2023-05-20 DIAGNOSIS — E876 Hypokalemia: Secondary | ICD-10-CM | POA: Diagnosis not present

## 2023-05-20 DIAGNOSIS — Z66 Do not resuscitate: Secondary | ICD-10-CM | POA: Diagnosis present

## 2023-05-20 DIAGNOSIS — Z96642 Presence of left artificial hip joint: Secondary | ICD-10-CM | POA: Diagnosis present

## 2023-05-20 DIAGNOSIS — Z803 Family history of malignant neoplasm of breast: Secondary | ICD-10-CM | POA: Diagnosis not present

## 2023-05-20 DIAGNOSIS — K219 Gastro-esophageal reflux disease without esophagitis: Secondary | ICD-10-CM | POA: Diagnosis present

## 2023-05-20 DIAGNOSIS — R0989 Other specified symptoms and signs involving the circulatory and respiratory systems: Secondary | ICD-10-CM | POA: Diagnosis not present

## 2023-05-20 DIAGNOSIS — N281 Cyst of kidney, acquired: Secondary | ICD-10-CM | POA: Diagnosis not present

## 2023-05-20 DIAGNOSIS — I959 Hypotension, unspecified: Secondary | ICD-10-CM | POA: Diagnosis not present

## 2023-05-20 DIAGNOSIS — I5032 Chronic diastolic (congestive) heart failure: Secondary | ICD-10-CM | POA: Diagnosis present

## 2023-05-20 DIAGNOSIS — I4891 Unspecified atrial fibrillation: Secondary | ICD-10-CM | POA: Diagnosis not present

## 2023-05-20 DIAGNOSIS — E785 Hyperlipidemia, unspecified: Secondary | ICD-10-CM | POA: Diagnosis present

## 2023-05-20 DIAGNOSIS — N390 Urinary tract infection, site not specified: Secondary | ICD-10-CM | POA: Diagnosis present

## 2023-05-20 DIAGNOSIS — M6281 Muscle weakness (generalized): Secondary | ICD-10-CM | POA: Diagnosis not present

## 2023-05-20 DIAGNOSIS — R4182 Altered mental status, unspecified: Secondary | ICD-10-CM | POA: Diagnosis present

## 2023-05-20 DIAGNOSIS — G9341 Metabolic encephalopathy: Secondary | ICD-10-CM | POA: Diagnosis present

## 2023-05-20 DIAGNOSIS — Z87891 Personal history of nicotine dependence: Secondary | ICD-10-CM | POA: Diagnosis not present

## 2023-05-20 DIAGNOSIS — G459 Transient cerebral ischemic attack, unspecified: Secondary | ICD-10-CM | POA: Diagnosis not present

## 2023-05-20 DIAGNOSIS — N182 Chronic kidney disease, stage 2 (mild): Secondary | ICD-10-CM | POA: Diagnosis present

## 2023-05-20 DIAGNOSIS — R2681 Unsteadiness on feet: Secondary | ICD-10-CM | POA: Diagnosis not present

## 2023-05-20 DIAGNOSIS — Z96611 Presence of right artificial shoulder joint: Secondary | ICD-10-CM | POA: Diagnosis present

## 2023-05-20 DIAGNOSIS — Z7901 Long term (current) use of anticoagulants: Secondary | ICD-10-CM | POA: Diagnosis not present

## 2023-05-20 DIAGNOSIS — R918 Other nonspecific abnormal finding of lung field: Secondary | ICD-10-CM | POA: Diagnosis not present

## 2023-05-20 DIAGNOSIS — R932 Abnormal findings on diagnostic imaging of liver and biliary tract: Secondary | ICD-10-CM | POA: Diagnosis not present

## 2023-05-20 DIAGNOSIS — Z87898 Personal history of other specified conditions: Secondary | ICD-10-CM | POA: Diagnosis not present

## 2023-05-20 DIAGNOSIS — Z8673 Personal history of transient ischemic attack (TIA), and cerebral infarction without residual deficits: Secondary | ICD-10-CM | POA: Diagnosis not present

## 2023-05-20 DIAGNOSIS — Z751 Person awaiting admission to adequate facility elsewhere: Secondary | ICD-10-CM | POA: Diagnosis not present

## 2023-05-20 DIAGNOSIS — Z1152 Encounter for screening for COVID-19: Secondary | ICD-10-CM | POA: Diagnosis not present

## 2023-05-20 DIAGNOSIS — K8020 Calculus of gallbladder without cholecystitis without obstruction: Secondary | ICD-10-CM | POA: Diagnosis not present

## 2023-05-20 DIAGNOSIS — R278 Other lack of coordination: Secondary | ICD-10-CM | POA: Diagnosis not present

## 2023-05-20 MED ORDER — GADOBUTROL 1 MMOL/ML IV SOLN
5.0000 mL | Freq: Once | INTRAVENOUS | Status: AC | PRN
  Administered 2023-05-20: 5 mL via INTRAVENOUS

## 2023-05-21 DIAGNOSIS — G9341 Metabolic encephalopathy: Secondary | ICD-10-CM | POA: Diagnosis not present

## 2023-05-21 DIAGNOSIS — M6281 Muscle weakness (generalized): Secondary | ICD-10-CM | POA: Diagnosis not present

## 2023-05-21 DIAGNOSIS — R278 Other lack of coordination: Secondary | ICD-10-CM | POA: Diagnosis not present

## 2023-05-21 DIAGNOSIS — R2681 Unsteadiness on feet: Secondary | ICD-10-CM | POA: Diagnosis not present

## 2023-05-22 ENCOUNTER — Other Ambulatory Visit: Payer: Self-pay

## 2023-05-22 ENCOUNTER — Inpatient Hospital Stay (HOSPITAL_COMMUNITY)
Admission: EM | Admit: 2023-05-22 | Discharge: 2023-05-26 | DRG: 689 | Disposition: A | Discharge status: Skilled Nursing Facility | Attending: Internal Medicine | Admitting: Internal Medicine

## 2023-05-22 ENCOUNTER — Observation Stay (HOSPITAL_COMMUNITY)

## 2023-05-22 ENCOUNTER — Encounter (HOSPITAL_COMMUNITY): Payer: Self-pay

## 2023-05-22 ENCOUNTER — Non-Acute Institutional Stay: Payer: Self-pay | Admitting: Nurse Practitioner

## 2023-05-22 ENCOUNTER — Encounter: Payer: Self-pay | Admitting: Nurse Practitioner

## 2023-05-22 ENCOUNTER — Emergency Department (HOSPITAL_COMMUNITY)

## 2023-05-22 ENCOUNTER — Telehealth: Payer: Self-pay | Admitting: *Deleted

## 2023-05-22 DIAGNOSIS — Z87440 Personal history of urinary (tract) infections: Secondary | ICD-10-CM

## 2023-05-22 DIAGNOSIS — Z8249 Family history of ischemic heart disease and other diseases of the circulatory system: Secondary | ICD-10-CM

## 2023-05-22 DIAGNOSIS — E1122 Type 2 diabetes mellitus with diabetic chronic kidney disease: Secondary | ICD-10-CM | POA: Diagnosis present

## 2023-05-22 DIAGNOSIS — R300 Dysuria: Secondary | ICD-10-CM

## 2023-05-22 DIAGNOSIS — Z1152 Encounter for screening for COVID-19: Secondary | ICD-10-CM

## 2023-05-22 DIAGNOSIS — I4891 Unspecified atrial fibrillation: Secondary | ICD-10-CM | POA: Diagnosis not present

## 2023-05-22 DIAGNOSIS — G9341 Metabolic encephalopathy: Secondary | ICD-10-CM | POA: Diagnosis present

## 2023-05-22 DIAGNOSIS — I10 Essential (primary) hypertension: Secondary | ICD-10-CM | POA: Diagnosis not present

## 2023-05-22 DIAGNOSIS — I480 Paroxysmal atrial fibrillation: Secondary | ICD-10-CM | POA: Diagnosis present

## 2023-05-22 DIAGNOSIS — E876 Hypokalemia: Secondary | ICD-10-CM | POA: Diagnosis not present

## 2023-05-22 DIAGNOSIS — K219 Gastro-esophageal reflux disease without esophagitis: Secondary | ICD-10-CM | POA: Diagnosis not present

## 2023-05-22 DIAGNOSIS — Z87898 Personal history of other specified conditions: Secondary | ICD-10-CM | POA: Diagnosis not present

## 2023-05-22 DIAGNOSIS — D509 Iron deficiency anemia, unspecified: Secondary | ICD-10-CM | POA: Diagnosis not present

## 2023-05-22 DIAGNOSIS — Z79899 Other long term (current) drug therapy: Secondary | ICD-10-CM

## 2023-05-22 DIAGNOSIS — Z66 Do not resuscitate: Secondary | ICD-10-CM | POA: Diagnosis present

## 2023-05-22 DIAGNOSIS — N182 Chronic kidney disease, stage 2 (mild): Secondary | ICD-10-CM | POA: Diagnosis present

## 2023-05-22 DIAGNOSIS — Z809 Family history of malignant neoplasm, unspecified: Secondary | ICD-10-CM

## 2023-05-22 DIAGNOSIS — Z8673 Personal history of transient ischemic attack (TIA), and cerebral infarction without residual deficits: Secondary | ICD-10-CM

## 2023-05-22 DIAGNOSIS — M1990 Unspecified osteoarthritis, unspecified site: Secondary | ICD-10-CM | POA: Diagnosis present

## 2023-05-22 DIAGNOSIS — Z96612 Presence of left artificial shoulder joint: Secondary | ICD-10-CM | POA: Diagnosis present

## 2023-05-22 DIAGNOSIS — I130 Hypertensive heart and chronic kidney disease with heart failure and stage 1 through stage 4 chronic kidney disease, or unspecified chronic kidney disease: Secondary | ICD-10-CM | POA: Diagnosis present

## 2023-05-22 DIAGNOSIS — E785 Hyperlipidemia, unspecified: Secondary | ICD-10-CM | POA: Diagnosis present

## 2023-05-22 DIAGNOSIS — N390 Urinary tract infection, site not specified: Principal | ICD-10-CM | POA: Diagnosis present

## 2023-05-22 DIAGNOSIS — Z96611 Presence of right artificial shoulder joint: Secondary | ICD-10-CM | POA: Diagnosis present

## 2023-05-22 DIAGNOSIS — Z96642 Presence of left artificial hip joint: Secondary | ICD-10-CM | POA: Diagnosis present

## 2023-05-22 DIAGNOSIS — R4182 Altered mental status, unspecified: Secondary | ICD-10-CM

## 2023-05-22 DIAGNOSIS — Z888 Allergy status to other drugs, medicaments and biological substances status: Secondary | ICD-10-CM

## 2023-05-22 DIAGNOSIS — R9082 White matter disease, unspecified: Secondary | ICD-10-CM | POA: Diagnosis not present

## 2023-05-22 DIAGNOSIS — Z751 Person awaiting admission to adequate facility elsewhere: Secondary | ICD-10-CM

## 2023-05-22 DIAGNOSIS — D631 Anemia in chronic kidney disease: Secondary | ICD-10-CM | POA: Diagnosis present

## 2023-05-22 DIAGNOSIS — Z803 Family history of malignant neoplasm of breast: Secondary | ICD-10-CM

## 2023-05-22 DIAGNOSIS — M810 Age-related osteoporosis without current pathological fracture: Secondary | ICD-10-CM | POA: Diagnosis present

## 2023-05-22 DIAGNOSIS — Z882 Allergy status to sulfonamides status: Secondary | ICD-10-CM

## 2023-05-22 DIAGNOSIS — E119 Type 2 diabetes mellitus without complications: Secondary | ICD-10-CM

## 2023-05-22 DIAGNOSIS — Z886 Allergy status to analgesic agent status: Secondary | ICD-10-CM

## 2023-05-22 DIAGNOSIS — Z7901 Long term (current) use of anticoagulants: Secondary | ICD-10-CM

## 2023-05-22 DIAGNOSIS — N179 Acute kidney failure, unspecified: Secondary | ICD-10-CM | POA: Diagnosis present

## 2023-05-22 DIAGNOSIS — Z87891 Personal history of nicotine dependence: Secondary | ICD-10-CM

## 2023-05-22 DIAGNOSIS — I5032 Chronic diastolic (congestive) heart failure: Secondary | ICD-10-CM | POA: Diagnosis present

## 2023-05-22 LAB — CBC WITH DIFFERENTIAL/PLATELET
Abs Immature Granulocytes: 0.04 10*3/uL (ref 0.00–0.07)
Basophils Absolute: 0.1 10*3/uL (ref 0.0–0.1)
Basophils Relative: 1 %
Eosinophils Absolute: 0.2 10*3/uL (ref 0.0–0.5)
Eosinophils Relative: 2 %
HCT: 34.2 % — ABNORMAL LOW (ref 36.0–46.0)
Hemoglobin: 10.7 g/dL — ABNORMAL LOW (ref 12.0–15.0)
Immature Granulocytes: 1 %
Lymphocytes Relative: 15 %
Lymphs Abs: 1.3 10*3/uL (ref 0.7–4.0)
MCH: 27.2 pg (ref 26.0–34.0)
MCHC: 31.3 g/dL (ref 30.0–36.0)
MCV: 86.8 fL (ref 80.0–100.0)
Monocytes Absolute: 0.9 10*3/uL (ref 0.1–1.0)
Monocytes Relative: 10 %
Neutro Abs: 6.3 10*3/uL (ref 1.7–7.7)
Neutrophils Relative %: 71 %
Platelets: 211 10*3/uL (ref 150–400)
RBC: 3.94 MIL/uL (ref 3.87–5.11)
RDW: 15.6 % — ABNORMAL HIGH (ref 11.5–15.5)
WBC: 8.8 10*3/uL (ref 4.0–10.5)
nRBC: 0 % (ref 0.0–0.2)

## 2023-05-22 LAB — COMPREHENSIVE METABOLIC PANEL
ALT: 7 U/L (ref 0–44)
AST: 16 U/L (ref 15–41)
Albumin: 2.8 g/dL — ABNORMAL LOW (ref 3.5–5.0)
Alkaline Phosphatase: 77 U/L (ref 38–126)
Anion gap: 8 (ref 5–15)
BUN: 12 mg/dL (ref 8–23)
CO2: 22 mmol/L (ref 22–32)
Calcium: 10.3 mg/dL (ref 8.9–10.3)
Chloride: 105 mmol/L (ref 98–111)
Creatinine, Ser: 1.05 mg/dL — ABNORMAL HIGH (ref 0.44–1.00)
GFR, Estimated: 52 mL/min — ABNORMAL LOW (ref >60)
Glucose, Bld: 97 mg/dL (ref 70–99)
Potassium: 4.1 mmol/L (ref 3.5–5.1)
Sodium: 135 mmol/L (ref 135–145)
Total Bilirubin: 0.8 mg/dL (ref 0.0–1.2)
Total Protein: 6.1 g/dL — ABNORMAL LOW (ref 6.5–8.1)

## 2023-05-22 LAB — URINALYSIS, W/ REFLEX TO CULTURE (INFECTION SUSPECTED)
Bilirubin Urine: NEGATIVE
Glucose, UA: NEGATIVE mg/dL
Hgb urine dipstick: NEGATIVE
Ketones, ur: NEGATIVE mg/dL
Leukocytes,Ua: NEGATIVE
Nitrite: NEGATIVE
Protein, ur: NEGATIVE mg/dL
Specific Gravity, Urine: 1.01 (ref 1.005–1.030)
pH: 6.5 (ref 5.0–8.0)

## 2023-05-22 LAB — TSH: TSH: 1.438 u[IU]/mL (ref 0.350–4.500)

## 2023-05-22 LAB — GLUCOSE, CAPILLARY
Glucose-Capillary: 106 mg/dL — ABNORMAL HIGH (ref 70–99)
Glucose-Capillary: 104 mg/dL — ABNORMAL HIGH (ref 70–99)

## 2023-05-22 LAB — LIPASE, BLOOD: Lipase: 28 U/L (ref 11–51)

## 2023-05-22 LAB — AMMONIA: Ammonia: 13 umol/L (ref 9–35)

## 2023-05-22 LAB — I-STAT CG4 LACTIC ACID, ED: Lactic Acid, Venous: 1.2 mmol/L (ref 0.5–1.9)

## 2023-05-22 LAB — APTT: aPTT: 46 s — ABNORMAL HIGH (ref 24–36)

## 2023-05-22 LAB — VITAMIN D 25 HYDROXY (VIT D DEFICIENCY, FRACTURES): Vit D, 25-Hydroxy: 30.54 ng/mL (ref 30–100)

## 2023-05-22 LAB — RESP PANEL BY RT-PCR (RSV, FLU A&B, COVID)  RVPGX2
Influenza A by PCR: NEGATIVE
Influenza B by PCR: NEGATIVE
Resp Syncytial Virus by PCR: NEGATIVE
SARS Coronavirus 2 by RT PCR: NEGATIVE

## 2023-05-22 LAB — VITAMIN B12: Vitamin B-12: 1459 pg/mL — ABNORMAL HIGH (ref 180–914)

## 2023-05-22 LAB — PROTIME-INR
INR: 2 — ABNORMAL HIGH (ref 0.8–1.2)
Prothrombin Time: 22.6 s — ABNORMAL HIGH (ref 11.4–15.2)

## 2023-05-22 MED ORDER — NICOTINE 21 MG/24HR TD PT24: 21.0000 mg | MEDICATED_PATCH | Freq: Every day | TRANSDERMAL | Status: DC | PRN

## 2023-05-22 MED ORDER — HYDRALAZINE HCL 20 MG/ML IJ SOLN: 10.0000 mg | INTRAMUSCULAR | Status: DC | PRN

## 2023-05-22 MED ORDER — ONDANSETRON HCL 4 MG PO TABS: 4.0000 mg | ORAL_TABLET | Freq: Four times a day (QID) | ORAL | Status: DC | PRN

## 2023-05-22 MED ORDER — FAMOTIDINE 20 MG PO TABS
20.0000 mg | ORAL_TABLET | Freq: Two times a day (BID) | ORAL | Status: DC
  Administered 2023-05-22 – 2023-05-26 (×8): 20 mg via ORAL
  Filled 2023-05-22 (×8): qty 1

## 2023-05-22 MED ORDER — PANTOPRAZOLE SODIUM 40 MG PO TBEC
40.0000 mg | DELAYED_RELEASE_TABLET | Freq: Two times a day (BID) | ORAL | Status: DC
  Administered 2023-05-22 – 2023-05-26 (×8): 40 mg via ORAL
  Filled 2023-05-22 (×8): qty 1

## 2023-05-22 MED ORDER — TRAZODONE HCL 50 MG PO TABS
50.0000 mg | ORAL_TABLET | Freq: Every evening | ORAL | Status: DC | PRN
  Filled 2023-05-22: qty 1

## 2023-05-22 MED ORDER — ATORVASTATIN CALCIUM 10 MG PO TABS
10.0000 mg | ORAL_TABLET | Freq: Every day | ORAL | Status: DC
  Administered 2023-05-22 – 2023-05-25 (×4): 10 mg via ORAL
  Filled 2023-05-22 (×4): qty 1

## 2023-05-22 MED ORDER — FERROUS SULFATE 220 (44 FE) MG/5ML PO SOLN
330.0000 mg | ORAL | Status: DC
  Administered 2023-05-25: 330 mg via ORAL
  Filled 2023-05-22: qty 7.5

## 2023-05-22 MED ORDER — TAMSULOSIN HCL 0.4 MG PO CAPS
0.4000 mg | ORAL_CAPSULE | Freq: Every day | ORAL | Status: DC
  Administered 2023-05-22 – 2023-05-26 (×5): 0.4 mg via ORAL
  Filled 2023-05-22 (×5): qty 1

## 2023-05-22 MED ORDER — IPRATROPIUM-ALBUTEROL 0.5-2.5 (3) MG/3ML IN SOLN: 3.0000 mL | RESPIRATORY_TRACT | Status: DC | PRN

## 2023-05-22 MED ORDER — ACETAMINOPHEN 650 MG RE SUPP: 650.0000 mg | Freq: Four times a day (QID) | RECTAL | Status: DC | PRN

## 2023-05-22 MED ORDER — ACETAMINOPHEN 325 MG PO TABS
650.0000 mg | ORAL_TABLET | Freq: Four times a day (QID) | ORAL | Status: DC | PRN
  Administered 2023-05-23 – 2023-05-26 (×3): 650 mg via ORAL
  Filled 2023-05-22 (×4): qty 2

## 2023-05-22 MED ORDER — BISACODYL 5 MG PO TBEC: 5.0000 mg | DELAYED_RELEASE_TABLET | Freq: Every day | ORAL | Status: DC | PRN

## 2023-05-22 MED ORDER — ALLOPURINOL 300 MG PO TABS
300.0000 mg | ORAL_TABLET | Freq: Every day | ORAL | Status: DC
  Administered 2023-05-22 – 2023-05-26 (×5): 300 mg via ORAL
  Filled 2023-05-22 (×2): qty 1
  Filled 2023-05-22: qty 3
  Filled 2023-05-22 (×3): qty 1

## 2023-05-22 MED ORDER — HYDRALAZINE HCL 50 MG PO TABS
100.0000 mg | ORAL_TABLET | Freq: Three times a day (TID) | ORAL | Status: DC
  Administered 2023-05-22 – 2023-05-26 (×11): 100 mg via ORAL
  Filled 2023-05-22 (×12): qty 2

## 2023-05-22 MED ORDER — ONDANSETRON HCL 4 MG/2ML IJ SOLN: 4.0000 mg | Freq: Four times a day (QID) | INTRAMUSCULAR | Status: DC | PRN

## 2023-05-22 MED ORDER — INSULIN ASPART 100 UNIT/ML IJ SOLN
0.0000 [IU] | Freq: Three times a day (TID) | INTRAMUSCULAR | Status: DC
  Administered 2023-05-24 – 2023-05-26 (×3): 1 [IU] via SUBCUTANEOUS

## 2023-05-22 MED ORDER — METOPROLOL TARTRATE 5 MG/5ML IV SOLN
5.0000 mg | INTRAVENOUS | Status: DC | PRN
  Filled 2023-05-22: qty 5

## 2023-05-22 MED ORDER — RIVAROXABAN 15 MG PO TABS
15.0000 mg | ORAL_TABLET | Freq: Every day | ORAL | Status: DC
  Administered 2023-05-22 – 2023-05-25 (×4): 15 mg via ORAL
  Filled 2023-05-22 (×5): qty 1

## 2023-05-22 MED ORDER — HYDROMORPHONE HCL 1 MG/ML IJ SOLN: 0.5000 mg | INTRAMUSCULAR | Status: DC | PRN

## 2023-05-22 MED ORDER — FLEET ENEMA RE ENEM: 1.0000 | ENEMA | Freq: Once | RECTAL | Status: DC | PRN

## 2023-05-22 MED ORDER — SENNOSIDES-DOCUSATE SODIUM 8.6-50 MG PO TABS: 1.0000 | ORAL_TABLET | Freq: Every evening | ORAL | Status: DC | PRN

## 2023-05-22 MED ORDER — DOCUSATE SODIUM 100 MG PO CAPS
100.0000 mg | ORAL_CAPSULE | Freq: Two times a day (BID) | ORAL | Status: DC
  Administered 2023-05-22 – 2023-05-26 (×9): 100 mg via ORAL
  Filled 2023-05-22 (×9): qty 1

## 2023-05-22 MED ORDER — GUAIFENESIN ER 600 MG PO TB12: 600.0000 mg | ORAL_TABLET | Freq: Two times a day (BID) | ORAL | Status: DC | PRN

## 2023-05-22 MED ORDER — OXYCODONE HCL 5 MG PO TABS: 5.0000 mg | ORAL_TABLET | ORAL | Status: DC | PRN

## 2023-05-22 MED ORDER — LORAZEPAM 2 MG/ML IJ SOLN
0.5000 mg | Freq: Once | INTRAMUSCULAR | Status: AC
  Administered 2023-05-22: 0.5 mg via INTRAVENOUS
  Filled 2023-05-22: qty 1

## 2023-05-22 MED ORDER — SODIUM CHLORIDE 0.9 % IV SOLN: INTRAVENOUS | Status: AC

## 2023-05-22 MED ORDER — VITAMIN B-12 1000 MCG PO TABS
1000.0000 ug | ORAL_TABLET | Freq: Every day | ORAL | Status: DC
  Administered 2023-05-22 – 2023-05-26 (×5): 1000 ug via ORAL
  Filled 2023-05-22 (×5): qty 1

## 2023-05-22 NOTE — Assessment & Plan Note (Signed)
 C/o burning sensation on urination, increased frequency and incontinence. Denied lower abd/back pain/pressure, she is afebrile.   05/14/23 negative UA 05/18/23 negative UA 05/19/23 treated with Pyridium

## 2023-05-22 NOTE — Assessment & Plan Note (Signed)
 Resuming Rivaroxaban 01/07/2023. 03/11/22 cardiology H&H, BMP q 6months. Heart rate is in control today.

## 2023-05-22 NOTE — Assessment & Plan Note (Signed)
 serum K 2.7 04/17/23<<3.5 05/14/23, Mg 1.5 05/14/23, on  Kcl+Mg supplement

## 2023-05-22 NOTE — Assessment & Plan Note (Signed)
 added Vit B12(Vit B12 273 12/10/22) and Fe 01/15/23. 01/30/23 Cardiology dc'd ASA

## 2023-05-22 NOTE — Progress Notes (Signed)
 Location:   [facility] Room Number: 801 Place of Service:  ALF (13) Provider: Arna Snipe Kip Cropp NP  Sigmund Hazel, MD  Patient Care Team: Sigmund Hazel, MD as PCP - General (Family Medicine) Tessa Lerner, DO as PCP - Cardiology (Cardiology) Suzi Roots as Physician Assistant (Dermatology)  Extended Emergency Contact Information Primary Emergency Contact: [name], [place] [address] [place] Home Phone: [phone] Work Phone: [phone] Mobile Phone: [phone] Relation: Son Secondary Emergency Contact: Charity,[name]  [place] Phone: [phone] Relation: Son  Code Status: DNR Goals of care: Advanced Directive information    04/01/2023    3:01 PM  Advanced Directives  Does Patient Have a Medical Advance Directive? Yes  Type of Advance Directive Out of facility DNR (pink MOST or yellow form);Healthcare Power of [place];Living will  Does patient want to make changes to medical advance directive? No - Guardian declined  Copy of Healthcare Power of Attorney in Chart? Yes - validated most recent copy scanned in chart (See row information)  Would patient like information on creating a medical advance directive? No - Patient declined  Pre-existing out of facility DNR order (yellow form or pink MOST form) Yellow form placed in chart (order not valid for inpatient use)     Chief Complaint  Patient presents with   Acute Visit    Altered mental status    HPI:  Pt is a 83 y.o. female seen today for an acute visit for the patient was found in recliner in near fetus position, repetitively saying put my legs down, but yelling out when assistance attempted. The patient stated she cannot open her eyes when asked to. The patient is oriented to person, place. No noted focal weakness. She is afebrile, no O2 desaturation.      C/o burning sensation on urination, increased frequency and incontinence. Denied lower  abd/back pain/pressure, she is afebrile.   05/14/23 negative UA 05/18/23 negative UA 05/19/23 treated with Pyridium              Hypokalemia, serum K 2.7 04/17/23<<3.5 05/14/23, on  Kcl supplement               GERD, on  Voquezna per GI 03/27/23  03/27/23 GI dc'd Pantoprazole, Famotidine, Carafage 04/01/23 f/u GI, dc'd Voquenza, restarted Pantoprazole, Famotidine, Sucralfate. Followed by GI, MRI and barium swallow study.               Anemia, added Vit B12(Vit B12 273 12/10/22) and Fe 01/15/23. 01/30/23 Cardiology dc'd ASA             New onset Atrial Fibrillation -2D echo from 10/13/2022 with a EF of 60 to 65%, grade 1 DD and mildly dilated left atrium. -Patient with history of bradycardia arrhythmia for which she was sent to cardiology in May 2024. -Patient felt not a great candidate for long-term anticoagulation but fall risk now is lower due to debility per son. -Patient placed on Xarelto which is preferred medication per family. -Patient has been placed on full dose Lovenox in anticipation of bone marrow biopsy which was done 01/06/2023.   -Resuming Rivaroxaban 01/07/2023. 03/11/22 cardiology H&H, BMP q 6months              Saw oncology 01/19/23 MGUS, negative cone marrow biopsy, moderate risk of progression to IgM myeloma or [place] macroglobulinemia(19-37% in next 20 years), f/u.  Hydronephrosis: f/u urology for renal mass left 1.9x1.6cm             Hospitalized 12/22/22 for metabolic encephalopathy 2/2 to UTI, AKI, and hypercalcemia.              Dysphagia, f/u ST             Hospitalized 10/12/22-10/17/22 UTI, encephalopathy, acute CVA             OA R+L knee pain, f/u Ortho for Gel inj, ambulates with walker.              CVA resolved difficulty word finding, off Statin, ASA, f/u stroke clinic, MRI small punctate infarct in the posteerior limb of R internal capsule, no focal weakness             Gout, stable, takes Allopurinol             HLD, the patient desires to resume Atorvastatin  and denied any allergic reaction, LDL 62 10/13/22, LDL 69 04/02/23, f/u lipid panel 6 months.              HTN, on Hydralazine,  prn Clonidine             CXR 10/14/22 pulm edema/volume overload, not apparent, off prn Furosemide, BNP 950.8 10/14/22, Echo 60-65% EF 10/13/22             Constipation, stable, diet controlled             Prediabetes Hgb A1c 6.0 10/13/22, TSH 1.293 10/12/22, ACR 242(<30)02/26/23             Positive anti CCP test, f/u Rheumatology              Bradycardia, Hx of, off Carvedilol              Hx of urinary retention, taking Tamsulosin.  Past Medical History:  Diagnosis Date   CKD (chronic kidney disease), stage II    nephrologist-- dr Kathrene Bongo  [facility])   Diabetes mellitus type 2, diet-controlled (HCC)    followed by pcp---   (09-20-2019  per pt does not check blood sugar at home)   Fracture of humeral shaft, right, closed 04/25/2022   GERD (gastroesophageal reflux disease)    History of primary hyperparathyroidism    s/p  parathyroidectomy right inferior on 12-02-2012   Humerus fracture 04/25/2022   Hyperlipidemia    Hypertension    followd by nephrologist----  (09-20-2019  per pt had stress test done some time ago , unsure when/ where, thinks told ok)   OA (osteoarthritis)    Osteoporosis    Prosthetic joint infection (HCC)    followed by dr j. hatcher (ID)   left total shoulder arthroplasty 12/ 2016  ; 12/ 2020  revision with explant joint replacement and hemiarthroplasty;  completed 6 month antibiotic 05/ 2021   Vertigo    Past Surgical History:  Procedure Laterality Date   COLONOSCOPY     DILATATION & CURETTAGE/HYSTEROSCOPY WITH MYOSURE N/A 09/28/2019   Procedure: DILATATION & CURETTAGE/HYSTEROSCOPY WITH MYOSURE;  Surgeon: Olivia Mackie, MD;  Location: [facility];  Service: Gynecology;  Laterality: N/A;   EYE SURGERY  yrs ago   laser surgery for retinal tear.  (unilateral , pt unsure which side)   ORIF HUMERUS FRACTURE Right  04/27/2022   Procedure: OPEN REDUCTION INTERNAL FIXATION (ORIF) PROXIMAL HUMERUS FRACTURE;  Surgeon: Bjorn Pippin, MD;  Location: [facility];  Service: Orthopedics;  Laterality: Right;  PARATHYROIDECTOMY N/A 12/02/2012   Procedure: PARATHYROIDECTOMY with frozen section ;  Surgeon: Velora Heckler, MD;  Location: [facility];  Service: General;  Laterality: N/A;   REVERSE SHOULDER ARTHROPLASTY Right 04/27/2022   Procedure: REVERSE SHOULDER ARTHROPLASTY;  Surgeon: Bjorn Pippin, MD;  Location: [facility];  Service: Orthopedics;  Laterality: Right;   SHOULDER INJECTION Left 07/27/2013   Procedure: SHOULDER INJECTION;  Surgeon: Loreta Ave, MD;  Location[facility];  Service: Orthopedics;  Laterality: Left;   STERIOD INJECTION Right 02/14/2015   Procedure: RIGHT SHOULDER CORTISONE INJECTION;  Surgeon: Loreta Ave, MD;  Location: [facility] OR;  Service: Orthopedics;  Laterality: Right;   TOTAL HIP ARTHROPLASTY Left 07/27/2013   Procedure: TOTAL HIP ARTHROPLASTY ANTERIOR APPROACH;  Surgeon: Loreta Ave, MD;  Location: [facility] OR;  Service: Orthopedics;  Laterality: Left;   TOTAL SHOULDER ARTHROPLASTY Left 02/14/2015   Procedure: LEFT TOTAL SHOULDER ARTHROPLASTY;  Surgeon: Loreta Ave, MD;  Location: [facility] OR;  Service: Orthopedics;  Laterality: Left;   TOTAL SHOULDER REVISION Left 02/09/2019   Procedure: TOTAL SHOULDER REVISION;  Surgeon: Bjorn Pippin, MD;  Location: [facility];  Service: Orthopedics;  Laterality: Left;    Allergies  Allergen Reactions   Abaloparatide Other (See Comments)    "Burred vision, lightheaded" (patient does not recall this in 2024)   Mobic [Meloxicam] Hypertension   Crestor [Rosuvastatin] Other (See Comments)    Muscle aches   Norvasc [Amlodipine Besylate] Swelling and Other (See Comments)    Ankle swelling   Simvastatin Other (See Comments)    Muscle aches, high LFTs   Sulfa Antibiotics Rash   Zetia [Ezetimibe] Other (See Comments)    Muscle aches    Allergies as of 05/22/2023        Reactions   Abaloparatide Other (See Comments)   "Burred vision, lightheaded" (patient does not recall this in 2024)   Mobic [meloxicam] Hypertension   Crestor [rosuvastatin] Other (See Comments)   Muscle aches   Norvasc [amlodipine Besylate] Swelling, Other (See Comments)   Ankle swelling   Simvastatin Other (See Comments)   Muscle aches, high LFTs   Sulfa Antibiotics Rash   Zetia [ezetimibe] Other (See Comments)   Muscle aches        Medication List        Accurate as of May 22, 2023 10:42 AM. If you have any questions, ask your nurse or doctor.          acetaminophen 325 MG tablet Commonly known as: TYLENOL Take 650 mg by mouth every 4 (four) hours as needed for moderate pain (pain score 4-6).   allopurinol 300 MG tablet Commonly known as: ZYLOPRIM Take 300 mg by mouth daily.   cloNIDine 0.1 MG tablet Commonly known as: CATAPRES Take 0.1 mg by mouth daily as needed (for hypertension- if systolic b/p is over 160).   cyanocobalamin 1000 MCG tablet Take 1,000 mcg by mouth daily.   docusate sodium 100 MG capsule Commonly known as: COLACE Take 100 mg by mouth 2 (two) times daily.   famotidine 20 MG tablet Commonly known as: PEPCID Take 1 tablet (20 mg total) by mouth 2 (two) times daily.   ferrous sulfate 325 (65 FE) MG tablet Take 325 mg by mouth See admin instructions. Take 325 mg by mouth by mouth once daily on Monday, Wednesday, and Friday   hydrALAZINE 100 MG tablet Commonly known as: APRESOLINE Take 1 tablet (100 mg total) by mouth 3 (three) times daily.   magnesium  oxide 400 (240 Mg) MG tablet Commonly known as: MAG-OX Take 1 tablet (400 mg total) by mouth daily.   meclizine 12.5 MG tablet Commonly known as: ANTIVERT Take 1 tablet (12.5 mg total) by mouth 3 (three) times daily as needed for dizziness.   pantoprazole 40 MG tablet Commonly known as: PROTONIX Take 1 tablet (40 mg total) by mouth 2 (two) times daily.   Rivaroxaban 15 MG Tabs  tablet Commonly known as: XARELTO Take 1 tablet (15 mg total) by mouth daily with supper.   senna-docusate 8.6-50 MG tablet Commonly known as: Senokot-S Take 1 tablet by mouth 2 (two) times daily.   sucralfate 1 GM/10ML suspension Commonly known as: Carafate Take 10 mLs (1 g total) by mouth 4 (four) times daily -  with meals and at bedtime. Give  1 hour before meals and at bedtime on an empt stomach   tamsulosin 0.4 MG Caps capsule Commonly known as: FLOMAX Take 1 capsule (0.4 mg total) by mouth daily.   Zinc Oxide 10 % Oint Apply 1 Application topically See admin instructions. 1 application to buttocks every shift for redness        Review of Systems  Constitutional:  Positive for activity change, appetite change and fatigue. Negative for fever.  HENT:  Negative for congestion, mouth sores and trouble swallowing.   Eyes:  Negative for visual disturbance.  Respiratory:  Negative for cough and shortness of breath.   Cardiovascular:  Negative for leg swelling.  Gastrointestinal:  Negative for abdominal pain, constipation, nausea and vomiting.       GERD symptoms intermittently  Genitourinary:  Positive for dysuria and frequency. Negative for difficulty urinating and urgency.       Nocturnal urination 2x average.  Incontinent of urine occasionally   Musculoskeletal:  Positive for arthralgias and gait problem.       R+L knee pain, R+L shoulder limited overhead ROM, c/o pain in the R shoulder, mild.   Skin:  Negative for color change.  Neurological:  Negative for tremors, syncope, facial asymmetry, speech difficulty, weakness and headaches.       In near fetus position  Psychiatric/Behavioral:  Positive for behavioral problems and sleep disturbance. Negative for confusion. The patient is not nervous/anxious.        Sometimes not sleeping, declined sleeping aid.     Immunization History  Administered Date(s) Administered   Influenza Split 11/24/2008   Influenza, High Dose  Seasonal PF 03/16/2014, 01/14/2016, 01/21/2017, 02/24/2022   Influenza,inj,Quad PF,6+ Mos 11/20/2010, 02/01/2013   Influenza-Unspecified 11/20/2010, 02/01/2013, 02/19/2018, 02/24/2022   Moderna Sars-Covid-2 Vaccination 04/23/2019, 05/21/2019, 02/20/2020   Pneumococcal Conjugate-13 03/16/2014   Pneumococcal Polysaccharide-23 04/28/2005   Td 04/28/2005   Td (Adult) 04/28/2005   Tdap 06/08/2017   Zoster Recombinant(Shingrix) 01/14/2019   Zoster, Live 07/21/2007   Pertinent  Health Maintenance Due  Topic Date Due   OPHTHALMOLOGY EXAM  Never done   HEMOGLOBIN A1C  04/15/2023   FOOT EXAM  02/20/2024   DEXA SCAN  Completed   INFLUENZA VACCINE  Discontinued      04/12/2019    2:21 PM 07/19/2019    2:20 PM 09/28/2019    8:51 AM 06/21/2020    5:59 PM 09/23/2021    1:10 PM  Fall Risk  Falls in the past year? 0 0     (RETIRED) Patient Fall Risk Level Low fall risk Low fall risk Moderate fall risk Low fall risk Low fall risk  Fall risk Follow up Falls evaluation completed Falls  evaluation completed      Functional Status Survey:    Vitals:   05/22/23 1023  BP: 134/64  Pulse: 66  Resp: 18  Temp: 98 F (36.7 C)  SpO2: 95%  Weight: 120 lb (54.4 kg)   Body mass index is 19.97 kg/m. Physical Exam Vitals and nursing note reviewed.  Constitutional:      Comments: fatigue  HENT:     Head: Normocephalic and atraumatic.     Nose: Nose normal.     Mouth/Throat:     Mouth: Mucous membranes are moist.  Eyes:     Extraocular Movements: Extraocular movements intact.     Conjunctiva/sclera: Conjunctivae normal.     Pupils: Pupils are equal, round, and reactive to light.  Cardiovascular:     Rate and Rhythm: Normal rate and regular rhythm.     Heart sounds: No murmur heard. Pulmonary:     Effort: Pulmonary effort is normal.     Breath sounds: Rales present. No wheezing or rhonchi.     Comments: Bibasilar rales.  Abdominal:     Palpations: Abdomen is soft.     Tenderness: There is no  abdominal tenderness.  Musculoskeletal:        General: Tenderness present.     Cervical back: Normal range of motion and neck supple.     Right lower leg: No edema.     Left lower leg: No edema.     Comments: R+L shoulder limited overhead ROM, mild R shoulder pain. S/p R+L shoulder replacement.   Skin:    General: Skin is warm and dry.     Comments: skin breakdown R+L buttocks, a dime sized open area left buttock, suspected the cause of bleeding reported, no active bleeding or s/s of infection, noted non blanchable redness R+L buttocks   Neurological:     General: No focal deficit present.     Mental Status: She is alert. Mental status is at baseline.     Gait: Gait abnormal.     Comments: Oriented to person, place.   Psychiatric:     Comments: In near fetus position in recliner, repetitively asking to put her legs down, but yelling out when attempted to assist, also the patient stated she cannot open her eyes when asked to      Labs reviewed: Recent Labs    01/06/23 0926 01/07/23 0425 01/08/23 0413 01/16/23 0000 01/19/23 0000 01/26/23 0718 01/29/23 0000 03/03/23 0000  NA 134* 134* 135   < > 137 135 136* 137  K 3.6 3.6 3.1*   < > 3.4* 4.1 3.8 3.6  CL 106 106 106   < > 105 109 105 104  CO2 22 19* 22  --  23* 21* 24* 23*  GLUCOSE 115* 89 95  --   --  98  --   --   BUN 14 13 13    < > 8 9 8 17   CREATININE 0.62 0.65 0.61   < > 0.5 0.95 0.8 0.8  CALCIUM 9.9 9.2 9.1   < > 8.4* 8.8* 9.6  --   MG 1.9 2.2 2.4  --   --   --   --   --   PHOS 2.0* 2.6 2.2*  --   --   --   --   --    < > = values in this interval not displayed.   Recent Labs    10/13/22 0738 12/24/22 0347 12/28/22 1256 01/01/23 0416 01/08/23 0413 01/16/23 0000 01/19/23  0000 03/03/23 0000  AST 14*  --  17  --  17 11* 10* 12*  ALT 10  --  14  --  11 7 3* 5*  ALKPHOS 56  --  85  --  64 72 79 76  BILITOT 1.2  --  0.8  --  0.6  --   --   --   PROT 5.1*  --  6.5  --  5.6*  --   --   --   ALBUMIN 2.6*   < >  3.0*   < > 2.7* 2.5* 2.6* 3.2*   < > = values in this interval not displayed.   Recent Labs    01/07/23 0425 01/08/23 0413 01/16/23 0000 01/19/23 0000 01/26/23 0718 01/29/23 0000 03/03/23 0000  WBC 6.4 8.8   < > 6.7 6.0 7.4 5.2  NEUTROABS  --  6.5   < > 4,730.00 3.9  --  3,474.00  HGB 8.8* 9.2*   < > 8.0* 7.9* 9.2* 10.0*  HCT 29.3* 29.3*   < > 24* 26.1* 29* 31*  MCV 99.3 96.7  --   --  94.6  --   --   PLT 231 254   < > 324 263 346 258   < > = values in this interval not displayed.   Lab Results  Component Value Date   TSH 1.499 12/28/2022   Lab Results  Component Value Date   HGBA1C 6.0 (H) 10/13/2022   Lab Results  Component Value Date   CHOL 121 10/13/2022   HDL 44 10/13/2022   LDLCALC 62 10/13/2022   TRIG 74 10/13/2022   CHOLHDL 2.8 10/13/2022    Significant Diagnostic Results in last 30 days:  DG ESOPHAGUS W SINGLE CM (SOL OR THIN BA) Result Date: 05/19/2023 CLINICAL DATA:  Reflux esophagitis. EXAM: ESOPHOGRAM/BARIUM SWALLOW TECHNIQUE: Single contrast examination was performed using thin barium. FLUOROSCOPY: Radiation Exposure Index (as provided by the fluoroscopic device): 5.2 mGy Kerma COMPARISON:  None Available. FINDINGS: The examination was severely limited due to limited patient mobility. The patient was unable to stand or get into a near prone/RAO position. The examination was therefore performed with the patient recumbent in the RPO and supine positions. The pharynx and cervical esophagus were not well evaluated. The thoracic esophagus appears normal in caliber without evidence of a gross mass or stricture. No substantial esophageal dysmotility was seen for age. There is a small sliding hiatal hernia. No gastroesophageal reflux was observed. A barium tablet was not administered. IMPRESSION: 1. Severely limited examination as detailed above. 2. Small sliding hiatal hernia. No gastroesophageal reflux was observed during this examination. 3. No evidence of an  esophageal stricture. Electronically Signed   By: Sebastian Ache M.D.   On: 05/19/2023 15:59    Assessment/Plan: Altered mental status  the patient was found in recliner in near fetus position, repetitively saying put my legs down, but yelling out when assistance attempted. The patient stated she cannot open her eyes when asked to. The patient is oriented to person, place. No noted focal weakness. She is afebrile, no O2 desaturation.  ED for further eval and tx.  Hx of stroke w/o focal residual and metabolic encephalopathy w/w to UTI/AKI  Dysuria C/o burning sensation on urination, increased frequency and incontinence. Denied lower abd/back pain/pressure, she is afebrile.   05/14/23 negative UA 05/18/23 negative UA 05/19/23 treated with Pyridium   Hypokalemia serum K 2.7 04/17/23<<3.5 05/14/23, Mg 1.5 05/14/23, on  Kcl+Mg supplement  Gastroesophageal  reflux disease 04/01/23 f/u GI, dc'd Voquenza, restarted Pantoprazole, Famotidine, Sucralfate. Followed by GI, MRI and barium swallow study.   IDA (iron deficiency anemia) added Vit B12(Vit B12 273 12/10/22) and Fe 01/15/23. 01/30/23 Cardiology dc'd ASA  Atrial fibrillation (HCC) Resuming Rivaroxaban 01/07/2023. 03/11/22 cardiology H&H, BMP q 6months. Heart rate is in control today.   Essential hypertension Blood pressure is controlled,  on Hydralazine,  prn Clonidine  History of urinary retention   Hx of urinary retention, taking Tamsulosin.    Family/ staff Communication: plan of care reviewed with the patient, the patient's HPOA, and charge nurse.   Labs/tests ordered:  none

## 2023-05-22 NOTE — ED Triage Notes (Signed)
 [facility] Patient having AMS and EMS was called out for a stroke as well Per EMS, the NP at the facility states she was catatonic. Patient was found to be thrashing around in her room and moaning and groaning. Unknown LKW Patient normally A&Ox3 Patient mumbling incoherent words on arrival

## 2023-05-22 NOTE — Assessment & Plan Note (Addendum)
 the patient was found in recliner in near fetus position, repetitively saying put my legs down, but yelling out when assistance attempted. The patient stated she cannot open her eyes when asked to. The patient is oriented to person, place. No noted focal weakness. She is afebrile, no O2 desaturation.  ED for further eval and tx.  Hx of stroke w/o focal residual and metabolic encephalopathy w/w to UTI/AKI

## 2023-05-22 NOTE — Assessment & Plan Note (Signed)
 Hx of urinary retention, taking Tamsulosin.

## 2023-05-22 NOTE — ED Provider Notes (Signed)
 [place] [facility]ERGENCY DEPARTMENT AT [facility] Provider Note   CSN: [id number] Arrival date & time: 05/22/23  1102     History  Chief Complaint  Patient presents with   Altered Mental Status    Tina Frank is a 83 y.o. female presenting from [facility] with concern for altered mental status.  Patient is typically fully oriented according to oral report provided by EMS and provider at facility.  This morning she was noted to be found in a recliner in a fetal position repeatedly stating "put my legs down" and yelling out, according to my review of the NP note from this morning.  There was then concern that the patient "became catatonic".  And EMS was called out.  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  Patient reportedly had a negative UA most recently on 10 March, 4 days ago, but was treated with Pyridium for burning urination.  She is also noted have a history of hypokalemia and A-fib for which she is on Xarelto.  Patient is a level 5 caveat for confusion.  She denies to me that she has a headache, chest pain.  She repeatedly asked "what is wrong with me".  HPI     Home Medications Prior to Admission medications   Medication Sig Start Date End Date Taking? Authorizing Provider  acetaminophen (TYLENOL) 325 MG tablet Take 650 mg by mouth every 4 (four) hours as needed for moderate pain (pain score 4-6).    [provider]  allopurinol (ZYLOPRIM) 300 MG tablet Take 300 mg by mouth daily.    [provider]  cloNIDine (CATAPRES) 0.1 MG tablet Take 0.1 mg by mouth daily as needed (for hypertension- if systolic b/p is over 160).    [provider]  cyanocobalamin 1000 MCG tablet Take 1,000 mcg by mouth daily.    [provider]  docusate sodium (COLACE) 100 MG capsule Take 100 mg by mouth 2 (two) times daily.    [provider]  famotidine (PEPCID) 20 MG tablet Take 1 tablet (20 mg total) by mouth 2 (two) times daily. 04/01/23   Medina-Vargas, Monina C, NP  ferrous sulfate 325 (65 FE) MG tablet Take 325 mg by mouth See admin instructions. Take 325 mg by mouth by mouth once daily on Monday, Wednesday, and Friday    [provider]  hydrALAZINE (APRESOLINE) 100 MG tablet Take 1 tablet (100 mg total) by mouth 3 (three) times daily. 01/06/23   Rodolph Bong, MD  magnesium oxide (MAG-OX) 400 (240 Mg) MG tablet Take 1 tablet (400 mg total) by mouth daily. 10/18/22   Burnadette Pop, MD  meclizine (ANTIVERT) 12.5 MG tablet Take 1 tablet (12.5 mg total) by mouth 3 (three) times daily as needed for dizziness. 01/06/23   Rodolph Bong, MD  pantoprazole (PROTONIX) 40 MG tablet Take 1 tablet (40 mg total) by mouth 2 (two) times daily. 04/01/23   Medina-Vargas, Monina C, NP  Rivaroxaban (XARELTO) 15 MG TABS tablet Take 1 tablet (15 mg total) by mouth daily with supper. 12/26/22   Rolly Salter, MD  senna-docusate (SENOKOT-S) 8.6-50 MG tablet Take 1 tablet by mouth 2 (two) times daily. Patient not taking: Reported on 04/01/2023 01/06/23   Rodolph Bong, MD  sucralfate (CARAFATE) 1 GM/10ML suspension Take 10 mLs (1 g total) by mouth 4 (four) times daily -  with meals and at bedtime. Give  1 hour before meals and at bedtime on an empt stomach 04/01/23   Medina-Vargas, Monina C, NP  tamsulosin (FLOMAX) 0.4 MG CAPS capsule Take 1 capsule (0.4 mg total) by mouth daily. 01/07/23   Rodolph Bong, MD  Zinc Oxide 10 % OINT Apply 1 Application topically See admin instructions. 1 application to buttocks every shift for redness    [provider]      Allergies    Abaloparatide, Mobic [meloxicam], Crestor [rosuvastatin], Norvasc [amlodipine besylate], Simvastatin, Sulfa antibiotics, and Zetia [ezetimibe]    Review of Systems   Review of Systems  Physical Exam Updated Vital Signs Pulse 67    Resp 17   SpO2 97%  Physical Exam HENT:     Head: Normocephalic and atraumatic.  Eyes:     Conjunctiva/sclera: Conjunctivae normal.     Pupils: Pupils are equal, round, and reactive to light.  Cardiovascular:     Rate and Rhythm: Normal rate and regular rhythm.  Pulmonary:     Effort: Pulmonary effort is normal. No respiratory distress.  Abdominal:     General: There is no distension.     Tenderness: There is no abdominal tenderness.  Skin:    General: Skin is warm and dry.  Neurological:     Mental Status: She is alert.     Comments: Patient gives all extremities to command, no evident facial droop, speech is clear, she is oriented only to herself, no gaze deviation noted on exam, sensation grossly intact     ED Results / Procedures / Treatments   Labs (all labs ordered are listed, but only abnormal results are displayed) Labs Reviewed  CULTURE, BLOOD (ROUTINE X 2)  CULTURE, BLOOD (ROUTINE X 2)  COMPREHENSIVE METABOLIC PANEL  CBC WITH DIFFERENTIAL/PLATELET  PROTIME-INR  APTT  URINALYSIS, W/ REFLEX TO CULTURE (INFECTION SUSPECTED)  I-STAT CG4 LACTIC ACID, ED    EKG None  Radiology No results found.  Procedures Procedures    Medications Ordered in ED Medications - No data to display  ED Course/ Medical Decision Making/ A&P                                 Medical Decision Making Amount and/or Complexity of Data Reviewed Labs: ordered. Radiology: ordered.  Risk Decision regarding hospitalization.   This patient presents to the Emergency Department with complaint of altered mental status.  This involves an extensive number of treatment options, and is a complaint that carries with it a high risk of complications and morbidity.  The differential diagnosis includes hypoglycemia vs metabolic encephalopathy vs infection (including cystitis) vs ICH vs stroke vs polypharmacy vs other  I ordered, reviewed, and interpreted labs, including no emergent findings; UA  without clear evidence of infection  I ordered imaging studies which included dg chest, CT head  I independently visualized and interpreted imaging which showed no emergent findings (question of infiltrate on xray but likely atelectasis, pt is not hypoxic) and the monitor tracing which showed   Additional history was obtained from EMS and facility provider by phone, who reports at baseline patient is oriented to herself and can perform basic ADL's, and confirms today's behavior is an acute change from baseline mental status  Previous records obtained and reviewed showing outpatient record from facility today  I personally reviewed the patients ECG which showed no acute ischemic findings  After the interventions stated above,  I reevaluated the patient and found no significant changes - no longer catatonic in the ED but continues to appear confused off baseline  At this time no evident localizing stroke symptoms or signs of meningitis.  Plan for medical admission for further encephalopathy workup, consideration of MRI.         Final Clinical Impression(s) / ED Diagnoses Final diagnoses:  None    Rx / DC Orders ED Discharge Orders     None         Trana Ressler, Kermit Balo, MD 05/22/23 [phone]

## 2023-05-22 NOTE — Telephone Encounter (Signed)
 Mast, Man X, NP  You8 minutes ago (2:37 PM)    Left voice message to the #[phone]    Mast, Man X, NP  You11 minutes ago (2:35 PM)    I will call the number to provide more inform.  Thanks

## 2023-05-22 NOTE — Assessment & Plan Note (Signed)
 04/01/23 f/u GI, dc'd [facility], restarted Pantoprazole, Famotidine, Sucralfate. Followed by GI, MRI and barium swallow study.

## 2023-05-22 NOTE — H&P (Signed)
 History and Physical    Tina Frank YQM:[id number] DOB: Dec 14, 1940 DOA: 05/22/2023  PCP: Sigmund Hazel, MD Patient coming from: ALF; [facility] Complaint: Altered mental status  HPI: Tina Frank is a 83 y.o. female with medical history significant of GERD, DM 2, diastolic CHF, paroxysmal atrial fibrillation on Xarelto, HLD, HTN, vertigo, osteoarthritis sent to the hospital from her facility for evaluation of mental status change.  History is limited as patient is very poor historian.  Patient was found to be in the recliner and near fetal position where she was repeating herself saying put her legs down and yelling when assistance was attempted.  During this time apparently patient's eyes were closed but she was alert to name and place.  No obvious focal deficits were identified at that time.  Due to this and her prior history of UTI she was sent to the hospital for further evaluation.  In the ER her routine blood work including CMP and CBC is unremarkable.  UA shows bacteriuria but no WBC, nitrates or leukocytes present.  CT head is negative.  Patient being admitted for further evaluation.  Attempted to contact her facility multiple times but has been unsuccessful.   Review of Systems: As per HPI otherwise 10 point review of systems negative.  Review of Systems Otherwise negative except as per HPI, including: General: Denies fever, chills, night sweats or unintended weight loss. Resp: Denies cough, wheezing, shortness of breath. Cardiac: Denies chest pain, palpitations, orthopnea, paroxysmal nocturnal dyspnea. GI: Denies abdominal pain, nausea, vomiting, diarrhea or constipation GU: Denies dysuria, frequency, hesitancy or incontinence MS: Denies muscle aches, joint pain or swelling Neuro: Denies headache, neurologic deficits (focal weakness, numbness, tingling), abnormal gait Psych: Denies anxiety, depression, SI/HI/AVH Skin: Denies new rashes or lesions ID: Denies  sick contacts, exotic exposures, travel  Past Medical History:  Diagnosis Date   CKD (chronic kidney disease), stage II    nephrologist-- dr Kathrene Bongo  [facility])   Diabetes mellitus type 2, diet-controlled (HCC)    followed by pcp---   (09-20-2019  per pt does not check blood sugar at home)   Fracture of humeral shaft, right, closed 04/25/2022   GERD (gastroesophageal reflux disease)    History of primary hyperparathyroidism    s/p  parathyroidectomy right inferior on 12-02-2012   Humerus fracture 04/25/2022   Hyperlipidemia    Hypertension    followd by nephrologist----  (09-20-2019  per pt had stress test done some time ago , unsure when/ where, thinks told ok)   OA (osteoarthritis)    Osteoporosis    Prosthetic joint infection (HCC)    followed by dr j. hatcher (ID)   left total shoulder arthroplasty 12/ 2016  ; 12/ 2020  revision with explant joint replacement and hemiarthroplasty;  completed 6 month antibiotic 05/ 2021   Vertigo     Past Surgical History:  Procedure Laterality Date   COLONOSCOPY     DILATATION & CURETTAGE/HYSTEROSCOPY WITH MYOSURE N/A 09/28/2019   Procedure: DILATATION & CURETTAGE/HYSTEROSCOPY WITH MYOSURE;  Surgeon: Olivia Mackie, MD;  Location: [facility] ;  Service: Gynecology;  Laterality: N/A;   EYE SURGERY  yrs ago   laser surgery for retinal tear.  (unilateral , pt unsure which side)   ORIF HUMERUS FRACTURE Right 04/27/2022   Procedure: OPEN REDUCTION INTERNAL FIXATION (ORIF) PROXIMAL HUMERUS FRACTURE;  Surgeon: Bjorn Pippin, MD;  Location: [facility];  Service: Orthopedics;  Laterality: Right;   PARATHYROIDECTOMY N/A 12/02/2012  Procedure: PARATHYROIDECTOMY with frozen section ;  Surgeon: Velora Heckler, MD;  Location: [facility];  Service: General;  Laterality: N/A;   REVERSE SHOULDER ARTHROPLASTY Right 04/27/2022   Procedure: REVERSE SHOULDER ARTHROPLASTY;  Surgeon: Bjorn Pippin, MD;  Location: [facility];  Service: Orthopedics;   Laterality: Right;   SHOULDER INJECTION Left 07/27/2013   Procedure: SHOULDER INJECTION;  Surgeon: Loreta Ave, MD;  Location[facility] OR;  Service: Orthopedics;  Laterality: Left;   STERIOD INJECTION Right 02/14/2015   Procedure: RIGHT SHOULDER CORTISONE INJECTION;  Surgeon: Loreta Ave, MD;  Location: [facility] OR;  Service: Orthopedics;  Laterality: Right;   TOTAL HIP ARTHROPLASTY Left 07/27/2013   Procedure: TOTAL HIP ARTHROPLASTY ANTERIOR APPROACH;  Surgeon: Loreta Ave, MD;  Location: [facility] OR;  Service: Orthopedics;  Laterality: Left;   TOTAL SHOULDER ARTHROPLASTY Left 02/14/2015   Procedure: LEFT TOTAL SHOULDER ARTHROPLASTY;  Surgeon: Loreta Ave, MD;  Location: [facility] OR;  Service: Orthopedics;  Laterality: Left;   TOTAL SHOULDER REVISION Left 02/09/2019   Procedure: TOTAL SHOULDER REVISION;  Surgeon: Bjorn Pippin, MD;  Location: [facility];  Service: Orthopedics;  Laterality: Left;    SOCIAL HISTORY:  reports that she quit smoking about 35 years ago. Her smoking use included cigarettes. She started smoking about 55 years ago. She has a 20 pack-year smoking history. She has never used smokeless tobacco. She reports that she does not drink alcohol and does not use drugs.  Allergies  Allergen Reactions   Abaloparatide Other (See Comments)    "Burred vision, lightheaded" (patient does not recall this in 2024)   Mobic [Meloxicam] Hypertension   Crestor [Rosuvastatin] Other (See Comments)    Muscle aches   Norvasc [Amlodipine Besylate] Swelling and Other (See Comments)    Ankle swelling   Simvastatin Other (See Comments)    Muscle aches, high LFTs   Sulfa Antibiotics Rash   Zetia [Ezetimibe] Other (See Comments)    Muscle aches    FAMILY HISTORY: Family History  Problem Relation Age of Onset   Heart failure Mother    Cancer Mother    Breast cancer Mother        in her 68s   Cancer Father      Prior to Admission medications   Medication Sig Start Date End Date Taking? Authorizing  Provider  acetaminophen (TYLENOL) 325 MG tablet Take 650 mg by mouth every 4 (four) hours as needed for moderate pain (pain score 4-6).   Yes [provider]  allopurinol (ZYLOPRIM) 300 MG tablet Take 300 mg by mouth daily.   Yes [provider]  atorvastatin (LIPITOR) 10 MG tablet Take 10 mg by mouth daily.   Yes [provider]  cloNIDine (CATAPRES) 0.1 MG tablet Take 0.1 mg by mouth daily as needed (for hypertension- if systolic b/p is over 160).   Yes [provider]  cyanocobalamin 1000 MCG tablet Take 1,000 mcg by mouth daily.   Yes [provider]  diclofenac Sodium (VOLTAREN) 1 % GEL Apply 2-4 g topically 4 (four) times daily.   Yes [provider]  docusate sodium (COLACE) 100 MG capsule Take 100 mg by mouth 2 (two) times daily.   Yes [provider]  famotidine (PEPCID) 20 MG tablet Take 1 tablet (20 mg total) by mouth 2 (two) times daily. 04/01/23  Yes Medina-Vargas, Monina C, NP  ferrous sulfate 220 (44 Fe) MG/5ML solution Take 7.5 mLs by mouth 3 (three) times a week. Monday, Wednesday, Friday  Yes [provider]  hydrALAZINE (APRESOLINE) 100 MG tablet Take 1 tablet (100 mg total) by mouth 3 (three) times daily. 01/06/23  Yes Rodolph Bong, MD  magnesium oxide (MAG-OX) 400 (240 Mg) MG tablet Take 1 tablet (400 mg total) by mouth daily. 10/18/22  Yes Burnadette Pop, MD  meclizine (ANTIVERT) 12.5 MG tablet Take 1 tablet (12.5 mg total) by mouth 3 (three) times daily as needed for dizziness. 01/06/23  Yes Rodolph Bong, MD  pantoprazole (PROTONIX) 40 MG tablet Take 1 tablet (40 mg total) by mouth 2 (two) times daily. 04/01/23  Yes Medina-Vargas, Monina C, NP  potassium chloride SA (KLOR-CON M) 20 MEQ tablet Take 40 mEq by mouth 3 (three) times daily.   Yes [provider]  Rivaroxaban (XARELTO) 15 MG TABS tablet Take 1 tablet (15 mg total) by mouth daily with supper. 12/26/22  Yes Rolly Salter, MD   sucralfate (CARAFATE) 1 GM/10ML suspension Take 10 mLs (1 g total) by mouth 4 (four) times daily -  with meals and at bedtime. Give  1 hour before meals and at bedtime on an empt stomach 04/01/23  Yes Medina-Vargas, Monina C, NP  tamsulosin (FLOMAX) 0.4 MG CAPS capsule Take 1 capsule (0.4 mg total) by mouth daily. 01/07/23  Yes Rodolph Bong, MD  Zinc Oxide 10 % OINT Apply 1 Application topically See admin instructions. 1 application to buttocks every shift for redness   Yes [provider]  senna-docusate (SENOKOT-S) 8.6-50 MG tablet Take 1 tablet by mouth 2 (two) times daily. Patient not taking: Reported on 03/31/2023 01/06/23   Rodolph Bong, MD    Physical Exam: Vitals:   05/22/23 1109 05/22/23 1116 05/22/23 1149 05/22/23 1200  BP:  (!) 169/88    Pulse: 67   81  Resp: 17   (!) 23  Temp:  (!) 97 F (36.1 C)    TempSrc:  Rectal    SpO2: 97%   100%  Weight:   55 kg   Height:   5\' 5"  (1.651 m)       Constitutional: NAD, calm, comfortable Eyes: PERRL, lids and conjunctivae normal ENMT: Mucous membranes are moist. Posterior pharynx clear of any exudate or lesions.Normal dentition.  Neck: normal, supple, no masses, no thyromegaly Respiratory: clear to auscultation bilaterally, no wheezing, no crackles. Normal respiratory effort. No accessory muscle use.  Cardiovascular: Regular rate and rhythm, no murmurs / rubs / gallops. No extremity edema. 2+ pedal pulses. No carotid bruits.  Abdomen: no tenderness, no masses palpated. No hepatosplenomegaly. Bowel sounds positive.  Musculoskeletal: no clubbing / cyanosis. No joint deformity upper and lower extremities. Good ROM, no contractures. Normal muscle tone.  Skin: no rashes, lesions, ulcers. No induration Neurologic: CN 2-12 grossly intact. Sensation intact, DTR normal. Strength 5/5 in all 4.  Psychiatric: Alert to name and place.  Poor judgment and insight.   Body mass index is 20.18 kg/m.      Labs on Admission:  I have personally reviewed following labs and imaging studies  CBC: Recent Labs  Lab 05/22/23 1116  WBC 8.8  NEUTROABS 6.3  HGB 10.7*  HCT 34.2*  MCV 86.8  PLT 211   Basic Metabolic Panel: Recent Labs  Lab 05/22/23 1116  NA 135  K 4.1  CL 105  CO2 22  GLUCOSE 97  BUN 12  CREATININE 1.05*  CALCIUM 10.3   GFR: Estimated Creatinine Clearance: 34.6 mL/min (A) (by C-G formula based on SCr of 1.05 mg/dL (H)).  Liver Function Tests: Recent Labs  Lab 05/22/23 1116  AST 16  ALT 7  ALKPHOS 77  BILITOT 0.8  PROT 6.1*  ALBUMIN 2.8*   No results for input(s): "LIPASE", "AMYLASE" in the last 168 hours. No results for input(s): "AMMONIA" in the last 168 hours. Coagulation Profile: Recent Labs  Lab 05/22/23 1116  INR 2.0*   Cardiac Enzymes: No results for input(s): "CKTOTAL", "CKMB", "CKMBINDEX", "TROPONINI" in the last 168 hours. BNP (last 3 results) No results for input(s): "PROBNP" in the last 8760 hours. HbA1C: No results for input(s): "HGBA1C" in the last 72 hours. CBG: No results for input(s): "GLUCAP" in the last 168 hours. Lipid Profile: No results for input(s): "CHOL", "HDL", "LDLCALC", "TRIG", "CHOLHDL", "LDLDIRECT" in the last 72 hours. Thyroid Function Tests: No results for input(s): "TSH", "T4TOTAL", "FREET4", "T3FREE", "THYROIDAB" in the last 72 hours. Anemia Panel: No results for input(s): "VITAMINB12", "FOLATE", "FERRITIN", "TIBC", "IRON", "RETICCTPCT" in the last 72 hours. Urine analysis:    Component Value Date/Time   COLORURINE YELLOW 05/22/2023 1116   APPEARANCEUR CLEAR 05/22/2023 1116   LABSPEC 1.010 05/22/2023 1116   PHURINE 6.5 05/22/2023 1116   GLUCOSEU NEGATIVE 05/22/2023 1116   HGBUR NEGATIVE 05/22/2023 1116   BILIRUBINUR NEGATIVE 05/22/2023 1116   KETONESUR NEGATIVE 05/22/2023 1116   PROTEINUR NEGATIVE 05/22/2023 1116   UROBILINOGEN 0.2 10/05/2013 2122   NITRITE NEGATIVE 05/22/2023 1116   LEUKOCYTESUR NEGATIVE 05/22/2023 1116    Sepsis Labs: !!!!!!!!!!!!!!!!!!!!!!!!!!!!!!!!!!!!!!!!!!!! @LABRCNTIP (procalcitonin:4,lacticidven:4) ) Recent Results (from the past 240 hours)  Resp panel by RT-PCR (RSV, Flu A&B, Covid) Anterior Nasal Swab     Status: None   Collection Time: 05/22/23 11:16 AM   Specimen: Anterior Nasal Swab  Result Value Ref Range Status   SARS Coronavirus 2 by RT PCR NEGATIVE NEGATIVE Final   Influenza A by PCR NEGATIVE NEGATIVE Final   Influenza B by PCR NEGATIVE NEGATIVE Final    Comment: (NOTE) The Xpert Xpress SARS-CoV-2/FLU/RSV plus assay is intended as an aid in the diagnosis of influenza from Nasopharyngeal swab specimens and should not be used as a sole basis for treatment. Nasal washings and aspirates are unacceptable for Xpert Xpress SARS-CoV-2/FLU/RSV testing.  Fact Sheet for Patients: BloggerCourse.com  Fact Sheet for Healthcare Providers: SeriousBroker.it  This test is not yet approved or cleared by the [place] FDA and has been authorized for detection and/or diagnosis of SARS-CoV-2 by FDA under an Emergency Use Authorization (EUA). This EUA will remain in effect (meaning this test can be used) for the duration of the COVID-19 declaration under Section 564(b)(1) of the Act, 21 U.S.C. section 360bbb-3(b)(1), unless the authorization is terminated or revoked.     Resp Syncytial Virus by PCR NEGATIVE NEGATIVE Final    Comment: (NOTE) Fact Sheet for Patients: BloggerCourse.com  Fact Sheet for Healthcare Providers: SeriousBroker.it  This test is not yet approved or cleared by the [place] FDA and has been authorized for detection and/or diagnosis of SARS-CoV-2 by FDA under an Emergency Use Authorization (EUA). This EUA will remain in effect (meaning this test can be used) for the duration of the COVID-19 declaration under Section 564(b)(1) of the Act, 21  U.S.C. section 360bbb-3(b)(1), unless the authorization is terminated or revoked.  Performed at [facility], [address], [place], [place] [address]      Radiological Exams on Admission: DG Chest Port 1 View Result Date: 05/22/2023 CLINICAL DATA:  83 year old female with questionable sepsis EXAM: PORTABLE CHEST 1 VIEW COMPARISON:  12/22/2022 FINDINGS:  Cardiomediastinal silhouette unchanged in size and contour. No evidence of central vascular congestion. No interlobular septal thickening. Low lung volumes. Opacity at the left lung base obscuring the left hemidiaphragm and the left heart border. No pneumothorax No acute displaced fracture. Surgical changes of the shoulders incompletely imaged IMPRESSION: Low lung volumes with opacity at the left lung base, potentially pneumonia/consolidation or atelectasis. Electronically Signed   By: Gilmer Mor D.O.   On: 05/22/2023 13:55   CT Head Wo Contrast Result Date: 05/22/2023 CLINICAL DATA:  Mental status change, altered mental status. EXAM: CT HEAD WITHOUT CONTRAST TECHNIQUE: Contiguous axial images were obtained from the base of the skull through the vertex without intravenous contrast. RADIATION DOSE REDUCTION: This exam was performed according to the departmental dose-optimization program which includes automated exposure control, adjustment of the mA and/or kV according to patient size and/or use of iterative reconstruction technique. COMPARISON:  MRI head 12/28/2022, CT head 10/12/2022 FINDINGS: Brain: No acute intracranial hemorrhage. No CT evidence of acute infarct. Nonspecific hypoattenuation in the periventricular and subcortical white matter favored to reflect chronic microvascular ischemic changes. Remote lacunar infarct in the right corona radiata. No edema, mass effect, or midline shift. The basilar cisterns are patent. Ventricles: The ventricles are normal. Vascular: Atherosclerotic calcifications of the carotid siphons and  intracranial vertebral arteries. No hyperdense vessel. Skull: No acute or aggressive finding. Chronic appearing bilateral nasal bone deformities. Orbits: Orbits are symmetric. Sinuses: Mild mucosal thickening in the ethmoid sinuses. Other: Mastoid air cells are clear. IMPRESSION: No CT evidence of acute intracranial abnormality. Moderate chronic microvascular ischemic changes. Small remote infarct in the right corona radiata. Electronically Signed   By: Emily Filbert M.D.   On: 05/22/2023 12:48    Nutritional status  All images have been reviewed by me personally.    Assessment/Plan Principal Problem:   AMS (altered mental status) Active Problems:   History of stroke   Essential hypertension   DM (diabetes mellitus) (HCC)   Hyperlipidemia   Atrial fibrillation (HCC)   Acute metabolic encephalopathy - Unclear etiology.  Thus far workup has been negative including CT head and UA.  Will check MRI brain, ammonia, TSH, B12 and folate. -Similar presentation about 6 months ago when her MRI and EEG did not show any significant findings. -Will check lipase  AKI on CKD stage II - Baseline creatinine 0.6, admission creatinine 1.05.  Will provide gentle hydration  Atrial fibrillation, paroxysmal - Continue home Xarelto.  Echocardiogram August 2024 showed preserved EF.  Essential hypertension -Continue home hydralazine.  IV as needed  GERD - PPI  Diabetes mellitus type 2 - Sliding scale and Accu-Chek  Anemia of chronic disease Hemoglobin around baseline of 10  History of vertigo - Will restart as needed meclizine if necessary.     DVT prophylaxis: Xarelto Code Status: DNR Family Communication: None at bedside, Called [facility]. No answer, going around in phone tree.  Consults called: None Admission status: Observation telemetry  Status is: Observation The patient remains OBS appropriate and will d/c before 2 midnights.   Time Spent: 65 minutes.  >50% of the  time was devoted to discussing the patients care, assessment, plan and disposition with other care givers along with counseling the patient about the risks and benefits of treatment.    Miguel Rota MD [facility]  If 7PM-7AM, please contact night-coverage   05/22/2023, 2:31 PM

## 2023-05-22 NOTE — Assessment & Plan Note (Signed)
 Blood pressure is controlled, on Hydralazine,  prn Clonidine

## 2023-05-22 NOTE — Telephone Encounter (Signed)
 Copied from CRM [id number]. Topic: General - Other >> May 22, 2023  1:43 PM Suzette B wrote: Reason for CRM: ER Provider Alvester Chou, is needing to speak with someone from the clinical staff in regards to the patient, he stated the patient was sent to the ER by Mast, Man X, NP, and needed to speak with someone to see exactly what was going on with patient . Please call provider on his mobile phone at [phone]    Patient is in AL at [facility]. ManXie seen today. Forwarded message to [facility] Mast.

## 2023-05-23 DIAGNOSIS — N390 Urinary tract infection, site not specified: Secondary | ICD-10-CM | POA: Diagnosis present

## 2023-05-23 DIAGNOSIS — D631 Anemia in chronic kidney disease: Secondary | ICD-10-CM | POA: Diagnosis present

## 2023-05-23 DIAGNOSIS — K219 Gastro-esophageal reflux disease without esophagitis: Secondary | ICD-10-CM | POA: Diagnosis present

## 2023-05-23 DIAGNOSIS — N182 Chronic kidney disease, stage 2 (mild): Secondary | ICD-10-CM | POA: Diagnosis present

## 2023-05-23 DIAGNOSIS — Z87440 Personal history of urinary (tract) infections: Secondary | ICD-10-CM | POA: Diagnosis not present

## 2023-05-23 DIAGNOSIS — Z7901 Long term (current) use of anticoagulants: Secondary | ICD-10-CM | POA: Diagnosis not present

## 2023-05-23 DIAGNOSIS — Z751 Person awaiting admission to adequate facility elsewhere: Secondary | ICD-10-CM | POA: Diagnosis not present

## 2023-05-23 DIAGNOSIS — Z8673 Personal history of transient ischemic attack (TIA), and cerebral infarction without residual deficits: Secondary | ICD-10-CM | POA: Diagnosis not present

## 2023-05-23 DIAGNOSIS — Z66 Do not resuscitate: Secondary | ICD-10-CM | POA: Diagnosis present

## 2023-05-23 DIAGNOSIS — G9341 Metabolic encephalopathy: Secondary | ICD-10-CM | POA: Diagnosis present

## 2023-05-23 DIAGNOSIS — I480 Paroxysmal atrial fibrillation: Secondary | ICD-10-CM | POA: Diagnosis present

## 2023-05-23 DIAGNOSIS — Z96642 Presence of left artificial hip joint: Secondary | ICD-10-CM | POA: Diagnosis present

## 2023-05-23 DIAGNOSIS — Z8249 Family history of ischemic heart disease and other diseases of the circulatory system: Secondary | ICD-10-CM | POA: Diagnosis not present

## 2023-05-23 DIAGNOSIS — R410 Disorientation, unspecified: Secondary | ICD-10-CM | POA: Diagnosis not present

## 2023-05-23 DIAGNOSIS — N179 Acute kidney failure, unspecified: Secondary | ICD-10-CM | POA: Diagnosis present

## 2023-05-23 DIAGNOSIS — Z803 Family history of malignant neoplasm of breast: Secondary | ICD-10-CM | POA: Diagnosis not present

## 2023-05-23 DIAGNOSIS — I130 Hypertensive heart and chronic kidney disease with heart failure and stage 1 through stage 4 chronic kidney disease, or unspecified chronic kidney disease: Secondary | ICD-10-CM | POA: Diagnosis present

## 2023-05-23 DIAGNOSIS — I5032 Chronic diastolic (congestive) heart failure: Secondary | ICD-10-CM | POA: Diagnosis present

## 2023-05-23 DIAGNOSIS — R4182 Altered mental status, unspecified: Secondary | ICD-10-CM | POA: Diagnosis present

## 2023-05-23 DIAGNOSIS — Z1152 Encounter for screening for COVID-19: Secondary | ICD-10-CM | POA: Diagnosis not present

## 2023-05-23 DIAGNOSIS — Z96611 Presence of right artificial shoulder joint: Secondary | ICD-10-CM | POA: Diagnosis present

## 2023-05-23 DIAGNOSIS — Z87891 Personal history of nicotine dependence: Secondary | ICD-10-CM | POA: Diagnosis not present

## 2023-05-23 DIAGNOSIS — M810 Age-related osteoporosis without current pathological fracture: Secondary | ICD-10-CM | POA: Diagnosis present

## 2023-05-23 DIAGNOSIS — E1122 Type 2 diabetes mellitus with diabetic chronic kidney disease: Secondary | ICD-10-CM | POA: Diagnosis present

## 2023-05-23 DIAGNOSIS — E785 Hyperlipidemia, unspecified: Secondary | ICD-10-CM | POA: Diagnosis present

## 2023-05-23 DIAGNOSIS — Z96612 Presence of left artificial shoulder joint: Secondary | ICD-10-CM | POA: Diagnosis present

## 2023-05-23 LAB — BLOOD CULTURE ID PANEL (REFLEXED) - BCID2

## 2023-05-23 LAB — BASIC METABOLIC PANEL
Anion gap: 6 (ref 5–15)
BUN: 10 mg/dL (ref 8–23)
CO2: 22 mmol/L (ref 22–32)
Calcium: 9.8 mg/dL (ref 8.9–10.3)
Chloride: 105 mmol/L (ref 98–111)
Creatinine, Ser: 0.89 mg/dL (ref 0.44–1.00)
GFR, Estimated: >60 mL/min (ref >60)
Glucose, Bld: 81 mg/dL (ref 70–99)
Potassium: 3.6 mmol/L (ref 3.5–5.1)
Sodium: 133 mmol/L — ABNORMAL LOW (ref 135–145)

## 2023-05-23 LAB — CBC
HCT: 33.9 % — ABNORMAL LOW (ref 36.0–46.0)
Hemoglobin: 11 g/dL — ABNORMAL LOW (ref 12.0–15.0)
MCH: 27.2 pg (ref 26.0–34.0)
MCHC: 32.4 g/dL (ref 30.0–36.0)
MCV: 83.9 fL (ref 80.0–100.0)
Platelets: 201 10*3/uL (ref 150–400)
RBC: 4.04 MIL/uL (ref 3.87–5.11)
RDW: 15.7 % — ABNORMAL HIGH (ref 11.5–15.5)
WBC: 6.5 10*3/uL (ref 4.0–10.5)
nRBC: 0 % (ref 0.0–0.2)

## 2023-05-23 LAB — MAGNESIUM: Magnesium: 1.5 mg/dL — ABNORMAL LOW (ref 1.7–2.4)

## 2023-05-23 LAB — GLUCOSE, CAPILLARY
Glucose-Capillary: 78 mg/dL (ref 70–99)
Glucose-Capillary: 73 mg/dL (ref 70–99)
Glucose-Capillary: 138 mg/dL — ABNORMAL HIGH (ref 70–99)
Glucose-Capillary: 94 mg/dL (ref 70–99)

## 2023-05-23 MED ORDER — SODIUM CHLORIDE 0.9 % IV SOLN
1.0000 g | INTRAVENOUS | Status: AC
  Administered 2023-05-23 – 2023-05-25 (×3): 1 g via INTRAVENOUS
  Filled 2023-05-23 (×3): qty 10

## 2023-05-23 MED ORDER — POTASSIUM CHLORIDE 20 MEQ PO PACK
40.0000 meq | PACK | Freq: Once | ORAL | Status: AC
  Administered 2023-05-23: 40 meq via ORAL
  Filled 2023-05-23: qty 2

## 2023-05-23 NOTE — Progress Notes (Signed)
 PHARMACY - PHYSICIAN COMMUNICATION CRITICAL VALUE ALERT - BLOOD CULTURE IDENTIFICATION (BCID)  Tina Frank is an 83 y.o. female who presented to [facility] on 05/22/2023 with a chief complaint of AMS.  Assessment:  Admitted for further w/u, no clinical indicators of infectious process, but blood cx now growing coag-neg staph in 2 of 4 blood cx bottles, likely a contaminant.  Name of physician (or Provider) ContactedAnthonette Legato MD  Current antibiotics: None  Changes to prescribed antibiotics recommended:  No changes needed; if infection cannot be ruled out would recommend vancomycin.  Results for orders placed or performed during the hospital encounter of 05/22/23  Blood Culture ID Panel (Reflexed) (Collected: 05/22/2023 11:16 AM)  Result Value Ref Range   Enterococcus faecalis NOT DETECTED NOT DETECTED   Enterococcus Faecium NOT DETECTED NOT DETECTED   Listeria monocytogenes NOT DETECTED NOT DETECTED   Staphylococcus species DETECTED (A) NOT DETECTED   Staphylococcus aureus (BCID) NOT DETECTED NOT DETECTED   Staphylococcus epidermidis NOT DETECTED NOT DETECTED   Staphylococcus lugdunensis NOT DETECTED NOT DETECTED   Streptococcus species NOT DETECTED NOT DETECTED   Streptococcus agalactiae NOT DETECTED NOT DETECTED   Streptococcus pneumoniae NOT DETECTED NOT DETECTED   Streptococcus pyogenes NOT DETECTED NOT DETECTED   A.calcoaceticus-baumannii NOT DETECTED NOT DETECTED   Bacteroides fragilis NOT DETECTED NOT DETECTED   Enterobacterales NOT DETECTED NOT DETECTED   Enterobacter cloacae complex NOT DETECTED NOT DETECTED   Escherichia coli NOT DETECTED NOT DETECTED   Klebsiella aerogenes NOT DETECTED NOT DETECTED   Klebsiella oxytoca NOT DETECTED NOT DETECTED   Klebsiella pneumoniae NOT DETECTED NOT DETECTED   Proteus species NOT DETECTED NOT DETECTED   Salmonella species NOT DETECTED NOT DETECTED   Serratia marcescens NOT DETECTED NOT DETECTED   Haemophilus influenzae NOT DETECTED  NOT DETECTED   Neisseria meningitidis NOT DETECTED NOT DETECTED   Pseudomonas aeruginosa NOT DETECTED NOT DETECTED   Stenotrophomonas maltophilia NOT DETECTED NOT DETECTED   Candida albicans NOT DETECTED NOT DETECTED   Candida auris NOT DETECTED NOT DETECTED   Candida glabrata NOT DETECTED NOT DETECTED   Candida krusei NOT DETECTED NOT DETECTED   Candida parapsilosis NOT DETECTED NOT DETECTED   Candida tropicalis NOT DETECTED NOT DETECTED   Cryptococcus neoformans/gattii NOT DETECTED NOT DETECTED    Vernard Gambles, PharmD, BCPS  05/23/2023  8:02 AM

## 2023-05-23 NOTE — Progress Notes (Signed)
 PROGRESS NOTE    Tina Frank  GEX:[id number] DOB: [birth year] DOA: 05/22/2023 PCP: Sigmund Hazel, MD    Brief Narrative:   83 y.o. female with medical history significant of GERD, DM 2, diastolic CHF, paroxysmal atrial fibrillation on Xarelto, HLD, HTN, vertigo, osteoarthritis sent to the hospital from her facility for evaluation of mental status change.  History is limited as patient is very poor historian.  Thus far CT head, MRI brain are negative for any acute pathology but does show chronic changes.  Metabolic workup is also negative.  UA shows bacteriuria but no signs of active infection.  She did receive IV fluid for clinical signs of mild dehydration.   Assessment & Plan:  Principal Problem:   AMS (altered mental status) Active Problems:   History of stroke   Essential hypertension   DM (diabetes mellitus) (HCC)   Hyperlipidemia   Atrial fibrillation (HCC)    Acute metabolic encephalopathy - Unclear etiology.  Thus far most of the workup is negative including CT head, MRI brain.  TSH, vitamin D, B12 are all normal. -After prolonged discussion with son, I will opt to treat for UTI although this is not clear that if patient was antibiotics prior to admission. -Similar presentation about 6 months ago when her MRI and EEG did not show any significant findings. - LFTs and lipase are normal  Bacteriuria - Concerns of possible UTI given her prior history and no other etiology for encephalopathy.  Also she had similar presentation in the past.  I will go ahead and start IV Rocephin   AKI on CKD stage II - Baseline creatinine 0.6, admission creatinine 1.05.  Resolved and back to baseline   Atrial fibrillation, paroxysmal - Continue home Xarelto.  Echocardiogram August 2024 showed preserved EF.   Essential hypertension -Continue home hydralazine.  IV as needed   GERD - PPI   Diabetes mellitus type 2 - Sliding scale and Accu-Chek   Anemia of chronic disease Hemoglobin  around baseline of 10   History of vertigo - Will restart as needed meclizine if necessary.    DVT prophylaxis: SCDs Start: 05/22/23 1426 Rivaroxaban (XARELTO) tablet 15 mg      Code Status: Limited: Do not attempt resuscitation (DNR) -DNR-LIMITED -Do Not Intubate/DNI  Family Communication: Son is at bedside  Somewhat encephalopathic.  Continue hospital stay   Subjective: Son at bedside.  Tells me patient is typically sharp and alert awake oriented X3.  She also ambulates with the help of walker.  Currently patient is improving but she still showing some confusion and mumbling every now and then.  This was similar presentation as prior UTI   Examination:  General exam: Appears calm and comfortable  Respiratory system: Clear to auscultation. Respiratory effort normal. Cardiovascular system: S1 & S2 heard, RRR. No JVD, murmurs, rubs, gallops or clicks. No pedal edema. Gastrointestinal system: Abdomen is nondistended, soft and nontender. No organomegaly or masses felt. Normal bowel sounds heard. Central nervous system: Alert and oriented to name and place. No focal neurological deficits. Words at times Extremities: Symmetric 5 x 5 power. Skin: No rashes, lesions or ulcers Psychiatry: Judgement and insight appear poor                Diet Orders (From admission, onward)     Start     Ordered   05/22/23 1428  Diet regular Room service appropriate? Yes; Fluid consistency: Thin  Diet effective now       Question Answer Comment  Room service appropriate? Yes   Fluid consistency: Thin      05/22/23 1428            Objective: Vitals:   05/22/23 2220 05/23/23 0438 05/23/23 0546 05/23/23 0759  BP: (!) 133/122 135/65  133/66  Pulse: 82 89  95  Resp: 16 19  19   Temp: 98.6 F (37 C) 97.8 F (36.6 C)  98 F (36.7 C)  TempSrc: Oral Oral    SpO2: 99% 95%  98%  Weight:   54.9 kg   Height:        Intake/Output Summary (Last 24 hours) at 05/23/2023 1038 Last data  filed at 05/22/2023 2345 Gross per 24 hour  Intake 148.59 ml  Output --  Net 148.59 ml   Filed Weights   05/22/23 1149 05/23/23 0546  Weight: 55 kg 54.9 kg    Scheduled Meds:  allopurinol  300 mg Oral Daily   atorvastatin  10 mg Oral QHS   cyanocobalamin  1,000 mcg Oral Daily   docusate sodium  100 mg Oral BID   famotidine  20 mg Oral BID   [START ON 05/25/2023] ferrous sulfate  330 mg Oral Once per day on Monday Wednesday Friday   hydrALAZINE  100 mg Oral TID   insulin aspart  0-6 Units Subcutaneous TID WC   pantoprazole  40 mg Oral BID   potassium chloride  40 mEq Oral Once   Rivaroxaban  15 mg Oral Q supper   tamsulosin  0.4 mg Oral Daily   Continuous Infusions:  sodium chloride 75 mL/hr at 05/23/23 0753   cefTRIAXone (ROCEPHIN)  IV      Nutritional status     Body mass index is 20.14 kg/m.  Data Reviewed:   CBC: Recent Labs  Lab 05/22/23 1116 05/23/23 0622  WBC 8.8 6.5  NEUTROABS 6.3  --   HGB 10.7* 11.0*  HCT 34.2* 33.9*  MCV 86.8 83.9  PLT 211 201   Basic Metabolic Panel: Recent Labs  Lab 05/22/23 1116 05/23/23 0622  NA 135 133*  K 4.1 3.6  CL 105 105  CO2 22 22  GLUCOSE 97 81  BUN 12 10  CREATININE 1.05* 0.89  CALCIUM 10.3 9.8  MG  --  1.5*   GFR: Estimated Creatinine Clearance: 40.8 mL/min (by C-G formula based on SCr of 0.89 mg/dL). Liver Function Tests: Recent Labs  Lab 05/22/23 1116  AST 16  ALT 7  ALKPHOS 77  BILITOT 0.8  PROT 6.1*  ALBUMIN 2.8*   Recent Labs  Lab 05/22/23 1450  LIPASE 28   Recent Labs  Lab 05/22/23 1450  AMMONIA 13   Coagulation Profile: Recent Labs  Lab 05/22/23 1116  INR 2.0*   Cardiac Enzymes: No results for input(s): "CKTOTAL", "CKMB", "CKMBINDEX", "TROPONINI" in the last 168 hours. BNP (last 3 results) No results for input(s): "PROBNP" in the last 8760 hours. HbA1C: No results for input(s): "HGBA1C" in the last 72 hours. CBG: Recent Labs  Lab 05/22/23 1723 05/22/23 2118  05/23/23 0800  GLUCAP 106* 104* 78   Lipid Profile: No results for input(s): "CHOL", "HDL", "LDLCALC", "TRIG", "CHOLHDL", "LDLDIRECT" in the last 72 hours. Thyroid Function Tests: Recent Labs    05/22/23 1450  TSH 1.438   Anemia Panel: Recent Labs    05/22/23 1450  VITAMINB12 1,459*   Sepsis Labs: Recent Labs  Lab 05/22/23 1127  LATICACIDVEN 1.2    Recent Results (from the past 240 hours)  Blood Culture (routine x  2)     Status: None (Preliminary result)   Collection Time: 05/22/23 11:16 AM   Specimen: BLOOD LEFT ARM  Result Value Ref Range Status   Specimen Description BLOOD LEFT ARM  Final   Special Requests   Final    BOTTLES DRAWN AEROBIC AND ANAEROBIC Blood Culture adequate volume   Culture  Setup Time   Final    GRAM POSITIVE COCCI IN BOTH AEROBIC AND ANAEROBIC BOTTLES Organism ID to follow CRITICAL RESULT CALLED TO, READ BACK BY AND VERIFIED WITH: V BRYK,PHARMD@0750  05/23/23 MK Performed at [facility], [address] [place], [place] [address]    Culture GRAM POSITIVE COCCI  Final   Report Status PENDING  Incomplete  Resp panel by RT-PCR (RSV, Flu A&B, Covid) Anterior Nasal Swab     Status: None   Collection Time: 05/22/23 11:16 AM   Specimen: Anterior Nasal Swab  Result Value Ref Range Status   SARS Coronavirus 2 by RT PCR NEGATIVE NEGATIVE Final   Influenza A by PCR NEGATIVE NEGATIVE Final   Influenza B by PCR NEGATIVE NEGATIVE Final    Comment: (NOTE) The Xpert Xpress SARS-CoV-2/FLU/RSV plus assay is intended as an aid in the diagnosis of influenza from Nasopharyngeal swab specimens and should not be used as a sole basis for treatment. Nasal washings and aspirates are unacceptable for Xpert Xpress SARS-CoV-2/FLU/RSV testing.  Fact Sheet for Patients: BloggerCourse.com  Fact Sheet for Healthcare Providers: SeriousBroker.it  This test is not yet approved or cleared by the [place] FDA  and has been authorized for detection and/or diagnosis of SARS-CoV-2 by FDA under an Emergency Use Authorization (EUA). This EUA will remain in effect (meaning this test can be used) for the duration of the COVID-19 declaration under Section 564(b)(1) of the Act, 21 U.S.C. section 360bbb-3(b)(1), unless the authorization is terminated or revoked.     Resp Syncytial Virus by PCR NEGATIVE NEGATIVE Final    Comment: (NOTE) Fact Sheet for Patients: BloggerCourse.com  Fact Sheet for Healthcare Providers: SeriousBroker.it  This test is not yet approved or cleared by the [place] FDA and has been authorized for detection and/or diagnosis of SARS-CoV-2 by FDA under an Emergency Use Authorization (EUA). This EUA will remain in effect (meaning this test can be used) for the duration of the COVID-19 declaration under Section 564(b)(1) of the Act, 21 U.S.C. section 360bbb-3(b)(1), unless the authorization is terminated or revoked.  Performed at [facility], [address], [place], [place] [address]   Blood Culture ID Panel (Reflexed)     Status: Abnormal   Collection Time: 05/22/23 11:16 AM  Result Value Ref Range Status   Enterococcus faecalis NOT DETECTED NOT DETECTED Final   Enterococcus Faecium NOT DETECTED NOT DETECTED Final   Listeria monocytogenes NOT DETECTED NOT DETECTED Final   Staphylococcus species DETECTED (A) NOT DETECTED Final    Comment: CRITICAL RESULT CALLED TO, READ BACK BY AND VERIFIED WITH: V BRYK,PHARMD@03 /15/25 MK    Staphylococcus aureus (BCID) NOT DETECTED NOT DETECTED Final   Staphylococcus epidermidis NOT DETECTED NOT DETECTED Final   Staphylococcus lugdunensis NOT DETECTED NOT DETECTED Final   Streptococcus species NOT DETECTED NOT DETECTED Final   Streptococcus agalactiae NOT DETECTED NOT DETECTED Final   Streptococcus pneumoniae NOT DETECTED NOT DETECTED Final   Streptococcus pyogenes NOT  DETECTED NOT DETECTED Final   A.calcoaceticus-baumannii NOT DETECTED NOT DETECTED Final   Bacteroides fragilis NOT DETECTED NOT DETECTED Final   Enterobacterales NOT DETECTED NOT DETECTED Final  Enterobacter cloacae complex NOT DETECTED NOT DETECTED Final   Escherichia coli NOT DETECTED NOT DETECTED Final   Klebsiella aerogenes NOT DETECTED NOT DETECTED Final   Klebsiella oxytoca NOT DETECTED NOT DETECTED Final   Klebsiella pneumoniae NOT DETECTED NOT DETECTED Final   Proteus species NOT DETECTED NOT DETECTED Final   Salmonella species NOT DETECTED NOT DETECTED Final   Serratia marcescens NOT DETECTED NOT DETECTED Final   Haemophilus influenzae NOT DETECTED NOT DETECTED Final   Neisseria meningitidis NOT DETECTED NOT DETECTED Final   Pseudomonas aeruginosa NOT DETECTED NOT DETECTED Final   Stenotrophomonas maltophilia NOT DETECTED NOT DETECTED Final   Candida albicans NOT DETECTED NOT DETECTED Final   Candida auris NOT DETECTED NOT DETECTED Final   Candida glabrata NOT DETECTED NOT DETECTED Final   Candida krusei NOT DETECTED NOT DETECTED Final   Candida parapsilosis NOT DETECTED NOT DETECTED Final   Candida tropicalis NOT DETECTED NOT DETECTED Final   Cryptococcus neoformans/gattii NOT DETECTED NOT DETECTED Final    Comment: Performed at [facility], [address], [place], [place] [address]  Blood Culture (routine x 2)     Status: None (Preliminary result)   Collection Time: 05/22/23 11:21 AM   Specimen: BLOOD RIGHT ARM  Result Value Ref Range Status   Specimen Description BLOOD RIGHT ARM  Final   Special Requests   Final    BOTTLES DRAWN AEROBIC AND ANAEROBIC Blood Culture results may not be optimal due to an inadequate volume of blood received in culture bottles   Culture   Final    NO GROWTH < 24 HOURS Performed at [facility], [address], [place], [place] [address]    Report Status PENDING  Incomplete         Radiology Studies: MR BRAIN WO  CONTRAST Result Date: 05/22/2023 CLINICAL DATA:  Mental status change EXAM: MRI HEAD WITHOUT CONTRAST TECHNIQUE: Multiplanar, multiecho pulse sequences of the brain and surrounding structures were obtained without intravenous contrast. COMPARISON:  Same-day head CT. FINDINGS: Brain: No acute infarct. No evidence of intracranial hemorrhage. Scattered and confluent FLAIR hyperintensity in the periventricular and subcortical white matter suggestive of moderate chronic microvascular ischemic changes. Additional FLAIR signal abnormality in the pons likely related to the same. Remote lacunar infarcts in the bilateral basal ganglia and right corona radiata. No mass lesion or midline shift. Cerebellum is unremarkable. Normal appearance of midline structures. The basilar cisterns are patent. No extra-axial fluid collections. Ventricles: Prominence of the lateral ventricles suggestive of underlying parenchymal volume loss. Vascular: Skull base flow voids are visualized. Skull and upper cervical spine: No focal abnormality. Sinuses/Orbits: Orbits are symmetric. Paranasal sinuses are clear. Other: Mastoid air cells are clear. IMPRESSION: No acute intracranial abnormality. Moderate chronic microvascular ischemic changes. Remote lacunar infarcts in the right corona radiata and bilateral basal ganglia. Mild generalized parenchymal volume loss. Electronically Signed   By: Emily Filbert M.D.   On: 05/22/2023 19:20   DG Chest Port 1 View Result Date: 05/22/2023 CLINICAL DATA:  83 year old female with questionable sepsis EXAM: PORTABLE CHEST 1 VIEW COMPARISON:  12/22/2022 FINDINGS: Cardiomediastinal silhouette unchanged in size and contour. No evidence of central vascular congestion. No interlobular septal thickening. Low lung volumes. Opacity at the left lung base obscuring the left hemidiaphragm and the left heart border. No pneumothorax No acute displaced fracture. Surgical changes of the shoulders incompletely imaged  IMPRESSION: Low lung volumes with opacity at the left lung base, potentially pneumonia/consolidation or atelectasis. Electronically Signed  By: Gilmer Mor D.O.   On: 05/22/2023 13:55   CT Head Wo Contrast Result Date: 05/22/2023 CLINICAL DATA:  Mental status change, altered mental status. EXAM: CT HEAD WITHOUT CONTRAST TECHNIQUE: Contiguous axial images were obtained from the base of the skull through the vertex without intravenous contrast. RADIATION DOSE REDUCTION: This exam was performed according to the departmental dose-optimization program which includes automated exposure control, adjustment of the mA and/or kV according to patient size and/or use of iterative reconstruction technique. COMPARISON:  MRI head 12/28/2022, CT head 10/12/2022 FINDINGS: Brain: No acute intracranial hemorrhage. No CT evidence of acute infarct. Nonspecific hypoattenuation in the periventricular and subcortical white matter favored to reflect chronic microvascular ischemic changes. Remote lacunar infarct in the right corona radiata. No edema, mass effect, or midline shift. The basilar cisterns are patent. Ventricles: The ventricles are normal. Vascular: Atherosclerotic calcifications of the carotid siphons and intracranial vertebral arteries. No hyperdense vessel. Skull: No acute or aggressive finding. Chronic appearing bilateral nasal bone deformities. Orbits: Orbits are symmetric. Sinuses: Mild mucosal thickening in the ethmoid sinuses. Other: Mastoid air cells are clear. IMPRESSION: No CT evidence of acute intracranial abnormality. Moderate chronic microvascular ischemic changes. Small remote infarct in the right corona radiata. Electronically Signed   By: Emily Filbert M.D.   On: 05/22/2023 12:48           LOS: 0 days   Time spent= 35 mins    Miguel Rota, MD [facility]  If 7PM-7AM, please contact night-coverage  05/23/2023, 10:38 AM

## 2023-05-23 NOTE — Plan of Care (Signed)

## 2023-05-23 NOTE — Evaluation (Addendum)
 Occupational Therapy Evaluation Patient Details Name: Tina Frank MRN: [id number] DOB: 07/29/1940 Today's Date: 05/23/2023   History of Present Illness   83 y.o. female with medical history significant of GERD, DM 2, diastolic CHF, paroxysmal atrial fibrillation on Xarelto, HLD, HTN, vertigo, osteoarthritis sent to the hospital from her facility for evaluation of mental status change. CT head, MRI brain are negative for any acute pathology but does show chronic changes.  Metabolic workup is also negative.  UA shows bacteriuria but no signs of active infection.  She did receive IV fluid for clinical signs of mild dehydration.     Clinical Impressions Pt presents with decline in function and safety with ADLs and ADL mobility with impaired strength, balance and endurance. PTA pt lives at [facility]. Pt's son present and assisting with providing PLOF info. Pt with some lethargy but answering questions appropriately. Per pt's son, she is Ind with UB ADLs, grooming and toileting; min A with LB ADLs, uses a RW for mobility. Pt's son also reports that pt is A & O x 3 at baseline, but has been confused recently. Pt currently requires max A with LB ADLs, min A with UB ADLs, max A with toileting and mod/min  A with sit-stand/mobility with RW. Pt would benefit from acute OT services to address impairments to maximize level of function and safety     If plan is discharge home, recommend the following:   A lot of help with bathing/dressing/bathroom;A lot of help with walking and/or transfers;Direct supervision/assist for medications management;Assist for transportation;Help with stairs or ramp for entrance     Functional Status Assessment   Patient has had a recent decline in their functional status and demonstrates the ability to make significant improvements in function in a reasonable and predictable amount of time.     Equipment Recommendations   None recommended by OT      Recommendations for Other Services   PT consult     Precautions/Restrictions   Precautions Precautions: Fall Restrictions Weight Bearing Restrictions Per Provider Order: No     Mobility Bed Mobility Overal bed mobility: Needs Assistance Bed Mobility: Supine to Sit     Supine to sit: Min assist     General bed mobility comments: increase time, mn A with LE mgt and to elevate trunk, able to scoot with CGA    Transfers Overall transfer level: Needs assistance Equipment used: Rolling walker (2 wheels) Transfers: Sit to/from Stand, Bed to chair/wheelchair/BSC Sit to Stand: Mod assist     Step pivot transfers: Min assist            Balance Overall balance assessment: Needs assistance Sitting-balance support: No upper extremity supported, Feet supported Sitting balance-Leahy Scale: Fair     Standing balance support: Bilateral upper extremity supported, During functional activity, Reliant on assistive device for balance Standing balance-Leahy Scale: Poor                             ADL either performed or assessed with clinical judgement   ADL Overall ADL's : Needs assistance/impaired Eating/Feeding: Independent;Sitting   Grooming: Wash/dry hands;Wash/dry face;Contact guard assist   Upper Body Bathing: Minimal assistance;Sitting   Lower Body Bathing: Maximal assistance   Upper Body Dressing : Minimal assistance;Sitting   Lower Body Dressing: Maximal assistance   Toilet Transfer: Moderate assistance;Rolling walker (2 wheels);Cueing for safety Toilet Transfer Details (indicate cue type and reason): simulated to chair Toileting- Clothing Manipulation  and Hygiene: Maximal assistance       Functional mobility during ADLs: Moderate assistance;Minimal assistance;Rolling walker (2 wheels);Cueing for safety       Vision Baseline Vision/History: 1 Wears glasses Ability to See in Adequate Light: 0 Adequate Patient Visual Report: No change from  baseline       Perception         Praxis         Pertinent Vitals/Pain Pain Assessment Pain Assessment: No/denies pain     Extremity/Trunk Assessment Upper Extremity Assessment Upper Extremity Assessment: Generalized weakness;Right hand dominant;LUE deficits/detail LUE Deficits / Details: hx of L shoulder replacement, AROM in flexion impaired. Per p't son she has difficulty using RW sometimes   Lower Extremity Assessment Lower Extremity Assessment: Defer to PT evaluation       Communication Communication Factors Affecting Communication: Hearing impaired   Cognition Arousal: Lethargic Behavior During Therapy: WFL for tasks assessed/performed Cognition: No apparent impairments             OT - Cognition Comments: pt initially sleepy, more alert once initiating sup - sit at EOB,  intemittently alert throughout session                 Following commands: Intact       Cueing  General Comments   Cueing Techniques: Verbal cues      Exercises     Shoulder Instructions      Home Living Family/patient expects to be discharged to:: Assisted living                             Home Equipment: Cane - single Librarian, academic (2 wheels);BSC/3in1   Additional Comments: per pt's son, she doe snot walk to dining room for meals, but that she is able, refuses a hearing aid      Prior Functioning/Environment               Mobility Comments: was walking short distances with RW ADLs Comments: Per son, pt needs min A with LB ADLs    OT Problem List: Impaired UE functional use;Decreased coordination;Decreased activity tolerance;Impaired balance (sitting and/or standing);Decreased strength;Decreased safety awareness   OT Treatment/Interventions: Self-care/ADL training;DME and/or AE instruction;Balance training;Therapeutic activities;Therapeutic exercise;Patient/family education      OT Goals(Current goals can be found in the care plan  section)   Acute Rehab OT Goals Patient Stated Goal: get better OT Goal Formulation: With patient/family Time For Goal Achievement: 06/06/23 Potential to Achieve Goals: Good ADL Goals Pt Will Perform Grooming: with min assist;with contact guard assist;standing Pt Will Perform Upper Body Bathing: with contact guard assist;with supervision;sitting Pt Will Perform Lower Body Bathing: with mod assist;with min assist Pt Will Perform Upper Body Dressing: with contact guard assist;with supervision;sitting Pt Will Transfer to Toilet: with min assist;with contact guard assist;ambulating Pt Will Perform Toileting - Clothing Manipulation and hygiene: with mod assist;with min assist;sit to/from stand   OT Frequency:  Min 1X/week    Co-evaluation              AM-PAC OT "6 Clicks" Daily Activity     Outcome Measure Help from another person eating meals?: None Help from another person taking care of personal grooming?: A Little Help from another person toileting, which includes using toliet, bedpan, or urinal?: A Lot Help from another person bathing (including washing, rinsing, drying)?: A Lot Help from another person to put on and taking off regular upper body clothing?:  A Little Help from another person to put on and taking off regular lower body clothing?: A Lot 6 Click Score: 16   End of Session Equipment Utilized During Treatment: Gait belt;Rolling walker (2 wheels)  Activity Tolerance: Patient limited by fatigue;Patient limited by lethargy Patient left: in chair;with call bell/phone within reach;with chair alarm set;with family/visitor present  OT Visit Diagnosis: Unsteadiness on feet (R26.81);Other abnormalities of gait and mobility (R26.89);History of falling (Z91.81);Muscle weakness (generalized) (M62.81)                Time: 5784-6962 OT Time Calculation (min): 39 min Charges:  OT General Charges $OT Visit: 1 Visit OT Evaluation $OT Eval Moderate Complexity: 1 Mod OT  Treatments $Self Care/Home Management : 8-22 mins $Therapeutic Activity: 8-22 mins   Galen Manila 05/23/2023, 11:35 AM

## 2023-05-23 NOTE — Hospital Course (Addendum)
 Brief Narrative:   83 y.o. female with medical history significant of GERD, DM 2, diastolic CHF, paroxysmal atrial fibrillation on Xarelto, HLD, HTN, vertigo, osteoarthritis sent to the hospital from her facility for evaluation of mental status change.  History is limited as patient is very poor historian.  Thus far CT head, MRI brain are negative for any acute pathology but does show chronic changes.  Metabolic workup is also negative.  UA shows bacteriuria but no signs of active infection.  She did receive IV fluid for clinical signs of mild dehydration.   Assessment & Plan:  Principal Problem:   AMS (altered mental status) Active Problems:   History of stroke   Essential hypertension   DM (diabetes mellitus) (HCC)   Hyperlipidemia   Atrial fibrillation (HCC)    Acute metabolic encephalopathy - Unclear etiology.  Thus far most of the workup is negative including CT head, MRI brain.  TSH, vitamin D, B12 are all normal. -After prolonged discussion with son, I will opt to treat for UTI although this is not clear that if patient was antibiotics prior to admission. -Similar presentation about 6 months ago when her MRI and EEG did not show any significant findings. - LFTs and lipase are normal  Bacteriuria - Concerns of possible UTI given her prior history and no other etiology for encephalopathy.  Also she had similar presentation in the past.  I will go ahead and start IV Rocephin   AKI on CKD stage II - Baseline creatinine 0.6, admission creatinine 1.05.  Resolved and back to baseline   Atrial fibrillation, paroxysmal - Continue home Xarelto.  Echocardiogram August 2024 showed preserved EF.   Essential hypertension -Continue home hydralazine.  IV as needed   GERD - PPI   Diabetes mellitus type 2 - Sliding scale and Accu-Chek   Anemia of chronic disease Hemoglobin around baseline of 10   History of vertigo - Will restart as needed meclizine if necessary.    DVT  prophylaxis: SCDs Start: 05/22/23 1426 Rivaroxaban (XARELTO) tablet 15 mg      Code Status: Limited: Do not attempt resuscitation (DNR) -DNR-LIMITED -Do Not Intubate/DNI  Family Communication: Son is at bedside  Somewhat encephalopathic.  Continue hospital stay   Subjective: Son at bedside.  Tells me patient is typically sharp and alert awake oriented X3.  She also ambulates with the help of walker.  Currently patient is improving but she still showing some confusion and mumbling every now and then.  This was similar presentation as prior UTI   Examination:  General exam: Appears calm and comfortable  Respiratory system: Clear to auscultation. Respiratory effort normal. Cardiovascular system: S1 & S2 heard, RRR. No JVD, murmurs, rubs, gallops or clicks. No pedal edema. Gastrointestinal system: Abdomen is nondistended, soft and nontender. No organomegaly or masses felt. Normal bowel sounds heard. Central nervous system: Alert and oriented to name and place. No focal neurological deficits. Words at times Extremities: Symmetric 5 x 5 power. Skin: No rashes, lesions or ulcers Psychiatry: Judgement and insight appear poor

## 2023-05-23 NOTE — Evaluation (Signed)
 Physical Therapy Evaluation Patient Details Name: RUBI TOOLEY MRN: [id number] DOB: April 05, 1940 Today's Date: 05/23/2023  History of Present Illness  83 y.o. female sent from her facility and admitted for AMS. CT head, MRI brain are negative for any acute pathology but does show chronic changes.PMH: GERD, DM 2, diastolic CHF, paroxysmal atrial fibrillation on Xarelto, HLD, HTN, vertigo, osteoarthritis   Clinical Impression  Pt admitted with above. Pt poor historian despite being oriented, PLOF taken from OT note earlier today where son was present to provide correct info. PTA pt lives at [facility] and was minA with ADLs and used RW for short distance mobility. Pt presenting with confusion, increased falls risk, requiring modA for transfers, demo's impaired ability to sequence step pvt transfers, noted high fear of falling as pt with strong posterior bias and noted nervousness. If ALF can provide this level of assist pt safe to return to [facility] with home health PT to help aide to return to PTA function. Acute PT to cont to follow.        If plan is discharge home, recommend the following: A lot of help with walking and/or transfers;A lot of help with bathing/dressing/bathroom   Can travel by private vehicle        Equipment Recommendations None recommended by PT  Recommendations for Other Services       Functional Status Assessment Patient has had a recent decline in their functional status and demonstrates the ability to make significant improvements in function in a reasonable and predictable amount of time.     Precautions / Restrictions Precautions Precautions: Fall Restrictions Weight Bearing Restrictions Per Provider Order: No      Mobility  Bed Mobility               General bed mobility comments: pt received in chair and declined going back to bed    Transfers Overall transfer level: Needs assistance Equipment used: Rolling walker (2  wheels) Transfers: Sit to/from Stand, Bed to chair/wheelchair/BSC Sit to Stand: Mod assist   Step pivot transfers: Mod assist       General transfer comment: max directional verbal cues, pt with short, shuffled steps, step pvt with face to face transfer from recliner to [facility], then used walker for step pvt from [facility] to EOB, and then back to chair per patient request. modA for walker management, pt with posterior bias. max directional and tactile cues to sit onto commode, EOB, and chair, pt with noted fear and difficulty sequencing sitting    Ambulation/Gait               General Gait Details: limited to 3 step pvt transfers, pt with strong posterior bias  Stairs            Wheelchair Mobility     Tilt Bed    Modified Rankin (Stroke Patients Only)       Balance Overall balance assessment: Needs assistance Sitting-balance support: No upper extremity supported, Feet supported Sitting balance-Leahy Scale: Fair   Postural control: Posterior lean Standing balance support: Bilateral upper extremity supported, During functional activity, Reliant on assistive device for balance Standing balance-Leahy Scale: Poor Standing balance comment: dependent on external support                             Pertinent Vitals/Pain Pain Assessment Pain Assessment: No/denies pain    Home Living Family/patient expects to be discharged to:: Assisted living ([facility])                 Home Equipment: Cane - single Librarian, academic (2 wheels);BSC/3in1 Additional Comments: info taken from OT as pt poor historian and son no longer in the room. per pt's son, she does not walk to dining room for meals, but that she is able, refuses a hearing aid    Prior Function Prior Level of Function : Needs assist             Mobility Comments: was walking short distances with RW ADLs Comments: Per son, pt needs min A with LB ADLs     Extremity/Trunk Assessment   Upper  Extremity Assessment Upper Extremity Assessment: Defer to OT evaluation LUE Deficits / Details: hx of L shoulder replacement, AROM in flexion impaired. Per p't son she has difficulty using RW sometimes    Lower Extremity Assessment Lower Extremity Assessment: Generalized weakness    Cervical / Trunk Assessment Cervical / Trunk Assessment: Kyphotic  Communication   Communication Communication: Impaired Factors Affecting Communication: Hearing impaired    Cognition Arousal: Lethargic Behavior During Therapy: WFL for tasks assessed/performed   PT - Cognitive impairments: No family/caregiver present to determine baseline                       PT - Cognition Comments: pt mildly confused but oriented to hospital and name stating she has an UTI. Pt with delayed response time, difficulty sequencing requiring multimodal cues, noted fear of falling Following commands: Impaired Following commands impaired: Follows one step commands inconsistently     Cueing Cueing Techniques: Verbal cues, Tactile cues     General Comments General comments (skin integrity, edema, etc.): pt assisted to [facility], pt set up for hygiene s/p urination    Exercises     Assessment/Plan    PT Assessment Patient needs continued PT services  PT Problem List Decreased strength;Decreased range of motion;Decreased balance;Decreased activity tolerance;Decreased mobility;Decreased knowledge of use of DME       PT Treatment Interventions DME instruction;Gait training;Stair training;Functional mobility training;Therapeutic activities;Therapeutic exercise    PT Goals (Current goals can be found in the Care Plan section)  Acute Rehab PT Goals Patient Stated Goal: didn't state PT Goal Formulation: Patient unable to participate in goal setting Time For Goal Achievement: 06/06/23 Potential to Achieve Goals: Good    Frequency Min 2X/week     Co-evaluation               AM-PAC PT "6 Clicks" Mobility   Outcome Measure Help needed turning from your back to your side while in a flat bed without using bedrails?: A Lot Help needed moving from lying on your back to sitting on the side of a flat bed without using bedrails?: A Lot Help needed moving to and from a bed to a chair (including a wheelchair)?: A Lot Help needed standing up from a chair using your arms (e.g., wheelchair or bedside chair)?: A Lot Help needed to walk in hospital room?: Total Help needed climbing 3-5 steps with a railing? : Total 6 Click Score: 10    End of Session Equipment Utilized During Treatment: Gait belt Activity Tolerance: Patient tolerated treatment well Patient left: in chair;with call bell/phone within reach;with chair alarm set;with nursing/sitter in room Nurse Communication: Mobility status PT Visit Diagnosis: Unsteadiness on feet (R26.81);Muscle weakness (generalized) (M62.81);Difficulty in walking, not elsewhere classified (R26.2)    Time: 1478-2956 PT Time Calculation (min) (ACUTE ONLY): 25  min   Charges:   PT Evaluation $PT Eval Moderate Complexity: 1 Mod PT Treatments $Therapeutic Activity: 8-22 mins PT General Charges $$ ACUTE PT VISIT: 1 Visit         Lewis Shock, PT, DPT Acute Rehabilitation Services Secure chat preferred Office #: [phone]   Iona Hansen 05/23/2023, 2:21 PM

## 2023-05-23 NOTE — Care Management Obs Status (Signed)
 MEDICARE OBSERVATION STATUS NOTIFICATION   Patient Details  Name: Tina Frank MRN: [id number] Date of Birth: 04-08-1940   Medicare Observation Status Notification Given:  Yes    Jariya Reichow G., RN 05/23/2023, 2:43 PM

## 2023-05-24 DIAGNOSIS — R4182 Altered mental status, unspecified: Secondary | ICD-10-CM | POA: Diagnosis not present

## 2023-05-24 LAB — BASIC METABOLIC PANEL
Anion gap: 9 (ref 5–15)
BUN: 12 mg/dL (ref 8–23)
CO2: 21 mmol/L — ABNORMAL LOW (ref 22–32)
Calcium: 9.7 mg/dL (ref 8.9–10.3)
Chloride: 103 mmol/L (ref 98–111)
Creatinine, Ser: 0.8 mg/dL (ref 0.44–1.00)
GFR, Estimated: >60 mL/min (ref >60)
Glucose, Bld: 96 mg/dL (ref 70–99)
Potassium: 3.7 mmol/L (ref 3.5–5.1)
Sodium: 133 mmol/L — ABNORMAL LOW (ref 135–145)

## 2023-05-24 LAB — GLUCOSE, CAPILLARY
Glucose-Capillary: 103 mg/dL — ABNORMAL HIGH (ref 70–99)
Glucose-Capillary: 107 mg/dL — ABNORMAL HIGH (ref 70–99)
Glucose-Capillary: 115 mg/dL — ABNORMAL HIGH (ref 70–99)
Glucose-Capillary: 160 mg/dL — ABNORMAL HIGH (ref 70–99)
Glucose-Capillary: 165 mg/dL — ABNORMAL HIGH (ref 70–99)
Glucose-Capillary: 98 mg/dL (ref 70–99)

## 2023-05-24 LAB — CBC
HCT: 33.2 % — ABNORMAL LOW (ref 36.0–46.0)
Hemoglobin: 10.7 g/dL — ABNORMAL LOW (ref 12.0–15.0)
MCH: 27.2 pg (ref 26.0–34.0)
MCHC: 32.2 g/dL (ref 30.0–36.0)
MCV: 84.3 fL (ref 80.0–100.0)
Platelets: 206 10*3/uL (ref 150–400)
RBC: 3.94 MIL/uL (ref 3.87–5.11)
RDW: 15.6 % — ABNORMAL HIGH (ref 11.5–15.5)
WBC: 7.6 10*3/uL (ref 4.0–10.5)
nRBC: 0 % (ref 0.0–0.2)

## 2023-05-24 LAB — MAGNESIUM: Magnesium: 1.4 mg/dL — ABNORMAL LOW (ref 1.7–2.4)

## 2023-05-24 MED ORDER — POTASSIUM CHLORIDE 20 MEQ PO PACK
40.0000 meq | PACK | Freq: Once | ORAL | Status: AC
  Administered 2023-05-24: 40 meq via ORAL
  Filled 2023-05-24: qty 2

## 2023-05-24 MED ORDER — POTASSIUM CHLORIDE CRYS ER 20 MEQ PO TBCR
40.0000 meq | EXTENDED_RELEASE_TABLET | Freq: Once | ORAL | Status: DC
  Filled 2023-05-24: qty 2

## 2023-05-24 MED ORDER — MAGNESIUM SULFATE 4 GM/100ML IV SOLN
4.0000 g | Freq: Once | INTRAVENOUS | Status: AC
  Administered 2023-05-24: 4 g via INTRAVENOUS
  Filled 2023-05-24: qty 100

## 2023-05-24 NOTE — Plan of Care (Signed)
  Problem: Education: Goal: Ability to describe self-care measures that may prevent or decrease complications (Diabetes Survival Skills Education) will improve Outcome: Progressing   Problem: Health Behavior/Discharge Planning: Goal: Ability to identify and utilize available resources and services will improve Outcome: Progressing   

## 2023-05-24 NOTE — Progress Notes (Signed)
 PROGRESS NOTE    Tina Frank  ZOX:[id number] DOB: [birth year] DOA: 05/22/2023 PCP: Sigmund Hazel, MD    Brief Narrative:   83 y.o. female with medical history significant of GERD, DM 2, diastolic CHF, paroxysmal atrial fibrillation on Xarelto, HLD, HTN, vertigo, osteoarthritis sent to the hospital from her facility for evaluation of mental status change.  History is limited as patient is very poor historian.  Thus far CT head, MRI brain are negative for any acute pathology but does show chronic changes.  Metabolic workup is also negative.  UA shows bacteriuria but no signs of active infection.  But given prior presentation, started on IV Rocephin.  PT/OT recommended HH/SNF.  TOC consulted.   Assessment & Plan:  Principal Problem:   AMS (altered mental status) Active Problems:   History of stroke   Essential hypertension   DM (diabetes mellitus) (HCC)   Hyperlipidemia   Atrial fibrillation (HCC)    Acute metabolic encephalopathy - Unclear etiology.  Thus far most of the workup is negative including CT head, MRI brain.  TSH, vitamin D, B12 are all normal. -After prolonged discussion with son, I will opt to treat for UTI although this is not clear that if patient was antibiotics prior to admission. -Similar presentation about 6 months ago when her MRI and EEG did not show any significant findings. - LFTs and lipase are normal  Bacteriuria Gram-positive bacteremia -I suspect gram-positive bacteremia is a contaminant.  Repeat cultures remain negative. - Given previous prior presentation of UTIs, and unclear if she was already on antibiotics prior to admission we will go ahead and opt to treat with IV Rocephin, total 3 days.  EOT 3/17 but monitor clinically   AKI on CKD stage II, resolved - Baseline creatinine 0.6, admission creatinine 1.05.  Resolved and back to baseline   Atrial fibrillation, paroxysmal - Continue home Xarelto.  Echocardiogram August 2024 showed preserved EF.    Essential hypertension -Continue home hydralazine.  IV as needed   GERD - PPI   Diabetes mellitus type 2 - Sliding scale and Accu-Chek   Anemia of chronic disease Hemoglobin around baseline of 10   History of vertigo - Will restart as needed meclizine if necessary.    DVT prophylaxis: Xarelto    Code Status: Limited: Do not attempt resuscitation (DNR) -DNR-LIMITED -Do Not Intubate/DNI  Family Communication: Son is at bedside Hopefully discharge in next 24 hours   Subjective: Feels better and stronger today.  Eating her Bfast  Examination:  General exam: Appears calm and comfortable  Respiratory system: Clear to auscultation. Respiratory effort normal. Cardiovascular system: S1 & S2 heard, RRR. No JVD, murmurs, rubs, gallops or clicks. No pedal edema. Gastrointestinal system: Abdomen is nondistended, soft and nontender. No organomegaly or masses felt. Normal bowel sounds heard. Central nervous system: Alert and oriented to name and place. No focal neurological deficits. Words at times Extremities: Symmetric 5 x 5 power. Skin: No rashes, lesions or ulcers Psychiatry: Judgement and insight appear poor                Diet Orders (From admission, onward)     Start     Ordered   05/22/23 1428  Diet regular Room service appropriate? Yes; Fluid consistency: Thin  Diet effective now       Question Answer Comment  Room service appropriate? Yes   Fluid consistency: Thin      05/22/23 1428            Objective:  Vitals:   05/23/23 1959 05/23/23 2104 05/24/23 0419 05/24/23 0831  BP: (!) 140/50 (!) 140/50 (!) 148/117 (!) 174/98  Pulse: (!) 59  96 86  Resp: 16  19 18   Temp: 98.3 F (36.8 C)  (!) 97.3 F (36.3 C) 98.3 F (36.8 C)  TempSrc: Oral     SpO2:   96% 98%  Weight:      Height:        Intake/Output Summary (Last 24 hours) at 05/24/2023 1017 Last data filed at 05/24/2023 0018 Gross per 24 hour  Intake 357.16 ml  Output 400 ml  Net -42.84 ml    Filed Weights   05/22/23 1149 05/23/23 0546  Weight: 55 kg 54.9 kg    Scheduled Meds:  allopurinol  300 mg Oral Daily   atorvastatin  10 mg Oral QHS   cyanocobalamin  1,000 mcg Oral Daily   docusate sodium  100 mg Oral BID   famotidine  20 mg Oral BID   [START ON 05/25/2023] ferrous sulfate  330 mg Oral Once per day on Monday Wednesday Friday   hydrALAZINE  100 mg Oral TID   insulin aspart  0-6 Units Subcutaneous TID WC   pantoprazole  40 mg Oral BID   Rivaroxaban  15 mg Oral Q supper   tamsulosin  0.4 mg Oral Daily   Continuous Infusions:  cefTRIAXone (ROCEPHIN)  IV 1 g (05/23/23 1119)    Nutritional status     Body mass index is 20.14 kg/m.  Data Reviewed:   CBC: Recent Labs  Lab 05/22/23 1116 05/23/23 0622 05/24/23 0701  WBC 8.8 6.5 7.6  NEUTROABS 6.3  --   --   HGB 10.7* 11.0* 10.7*  HCT 34.2* 33.9* 33.2*  MCV 86.8 83.9 84.3  PLT 211 201 206   Basic Metabolic Panel: Recent Labs  Lab 05/22/23 1116 05/23/23 0622 05/24/23 0701  NA 135 133* 133*  K 4.1 3.6 3.7  CL 105 105 103  CO2 22 22 21*  GLUCOSE 97 81 96  BUN 12 10 12   CREATININE 1.05* 0.89 0.80  CALCIUM 10.3 9.8 9.7  MG  --  1.5* 1.4*   GFR: Estimated Creatinine Clearance: 45.4 mL/min (by C-G formula based on SCr of 0.8 mg/dL). Liver Function Tests: Recent Labs  Lab 05/22/23 1116  AST 16  ALT 7  ALKPHOS 77  BILITOT 0.8  PROT 6.1*  ALBUMIN 2.8*   Recent Labs  Lab 05/22/23 1450  LIPASE 28   Recent Labs  Lab 05/22/23 1450  AMMONIA 13   Coagulation Profile: Recent Labs  Lab 05/22/23 1116  INR 2.0*   Cardiac Enzymes: No results for input(s): "CKTOTAL", "CKMB", "CKMBINDEX", "TROPONINI" in the last 168 hours. BNP (last 3 results) No results for input(s): "PROBNP" in the last 8760 hours. HbA1C: No results for input(s): "HGBA1C" in the last 72 hours. CBG: Recent Labs  Lab 05/23/23 1624 05/23/23 2104 05/24/23 0015 05/24/23 0754 05/24/23 0848  GLUCAP 138* 94 103* 98  107*   Lipid Profile: No results for input(s): "CHOL", "HDL", "LDLCALC", "TRIG", "CHOLHDL", "LDLDIRECT" in the last 72 hours. Thyroid Function Tests: Recent Labs    05/22/23 1450  TSH 1.438   Anemia Panel: Recent Labs    05/22/23 1450  VITAMINB12 1,459*   Sepsis Labs: Recent Labs  Lab 05/22/23 1127  LATICACIDVEN 1.2    Recent Results (from the past 240 hours)  Blood Culture (routine x 2)     Status: None (Preliminary result)   Collection  Time: 05/22/23 11:16 AM   Specimen: BLOOD LEFT ARM  Result Value Ref Range Status   Specimen Description BLOOD LEFT ARM  Final   Special Requests   Final    BOTTLES DRAWN AEROBIC AND ANAEROBIC Blood Culture adequate volume   Culture  Setup Time   Final    GRAM POSITIVE COCCI IN BOTH AEROBIC AND ANAEROBIC BOTTLES Organism ID to follow CRITICAL RESULT CALLED TO, READ BACK BY AND VERIFIED WITH: V BRYK,PHARMD@0750  05/23/23 MK    Culture   Final    GRAM POSITIVE COCCI IDENTIFICATION TO FOLLOW Performed at [facility], [address] [place], [place] [address]    Report Status PENDING  Incomplete  Resp panel by RT-PCR (RSV, Flu A&B, Covid) Anterior Nasal Swab     Status: None   Collection Time: 05/22/23 11:16 AM   Specimen: Anterior Nasal Swab  Result Value Ref Range Status   SARS Coronavirus 2 by RT PCR NEGATIVE NEGATIVE Final   Influenza A by PCR NEGATIVE NEGATIVE Final   Influenza B by PCR NEGATIVE NEGATIVE Final    Comment: (NOTE) The Xpert Xpress SARS-CoV-2/FLU/RSV plus assay is intended as an aid in the diagnosis of influenza from Nasopharyngeal swab specimens and should not be used as a sole basis for treatment. Nasal washings and aspirates are unacceptable for Xpert Xpress SARS-CoV-2/FLU/RSV testing.  Fact Sheet for Patients: BloggerCourse.com  Fact Sheet for Healthcare Providers: SeriousBroker.it  This test is not yet approved or cleared by the [place] FDA  and has been authorized for detection and/or diagnosis of SARS-CoV-2 by FDA under an Emergency Use Authorization (EUA). This EUA will remain in effect (meaning this test can be used) for the duration of the COVID-19 declaration under Section 564(b)(1) of the Act, 21 U.S.C. section 360bbb-3(b)(1), unless the authorization is terminated or revoked.     Resp Syncytial Virus by PCR NEGATIVE NEGATIVE Final    Comment: (NOTE) Fact Sheet for Patients: BloggerCourse.com  Fact Sheet for Healthcare Providers: SeriousBroker.it  This test is not yet approved or cleared by the [place] FDA and has been authorized for detection and/or diagnosis of SARS-CoV-2 by FDA under an Emergency Use Authorization (EUA). This EUA will remain in effect (meaning this test can be used) for the duration of the COVID-19 declaration under Section 564(b)(1) of the Act, 21 U.S.C. section 360bbb-3(b)(1), unless the authorization is terminated or revoked.  Performed at [facility], [address], [place], [place] [address]   Blood Culture ID Panel (Reflexed)     Status: Abnormal   Collection Time: 05/22/23 11:16 AM  Result Value Ref Range Status   Enterococcus faecalis NOT DETECTED NOT DETECTED Final   Enterococcus Faecium NOT DETECTED NOT DETECTED Final   Listeria monocytogenes NOT DETECTED NOT DETECTED Final   Staphylococcus species DETECTED (A) NOT DETECTED Final    Comment: CRITICAL RESULT CALLED TO, READ BACK BY AND VERIFIED WITH: V BRYK,PHARMD@03 /15/25 MK    Staphylococcus aureus (BCID) NOT DETECTED NOT DETECTED Final   Staphylococcus epidermidis NOT DETECTED NOT DETECTED Final   Staphylococcus lugdunensis NOT DETECTED NOT DETECTED Final   Streptococcus species NOT DETECTED NOT DETECTED Final   Streptococcus agalactiae NOT DETECTED NOT DETECTED Final   Streptococcus pneumoniae NOT DETECTED NOT DETECTED Final   Streptococcus pyogenes NOT  DETECTED NOT DETECTED Final   A.calcoaceticus-baumannii NOT DETECTED NOT DETECTED Final   Bacteroides fragilis NOT DETECTED NOT DETECTED Final   Enterobacterales NOT DETECTED NOT DETECTED Final   Enterobacter cloacae  complex NOT DETECTED NOT DETECTED Final   Escherichia coli NOT DETECTED NOT DETECTED Final   Klebsiella aerogenes NOT DETECTED NOT DETECTED Final   Klebsiella oxytoca NOT DETECTED NOT DETECTED Final   Klebsiella pneumoniae NOT DETECTED NOT DETECTED Final   Proteus species NOT DETECTED NOT DETECTED Final   Salmonella species NOT DETECTED NOT DETECTED Final   Serratia marcescens NOT DETECTED NOT DETECTED Final   Haemophilus influenzae NOT DETECTED NOT DETECTED Final   Neisseria meningitidis NOT DETECTED NOT DETECTED Final   Pseudomonas aeruginosa NOT DETECTED NOT DETECTED Final   Stenotrophomonas maltophilia NOT DETECTED NOT DETECTED Final   Candida albicans NOT DETECTED NOT DETECTED Final   Candida auris NOT DETECTED NOT DETECTED Final   Candida glabrata NOT DETECTED NOT DETECTED Final   Candida krusei NOT DETECTED NOT DETECTED Final   Candida parapsilosis NOT DETECTED NOT DETECTED Final   Candida tropicalis NOT DETECTED NOT DETECTED Final   Cryptococcus neoformans/gattii NOT DETECTED NOT DETECTED Final    Comment: Performed at [facility], [address] [place], [place] [address]  Blood Culture (routine x 2)     Status: None (Preliminary result)   Collection Time: 05/22/23 11:21 AM   Specimen: BLOOD RIGHT ARM  Result Value Ref Range Status   Specimen Description BLOOD RIGHT ARM  Final   Special Requests   Final    BOTTLES DRAWN AEROBIC AND ANAEROBIC Blood Culture results may not be optimal due to an inadequate volume of blood received in culture bottles   Culture   Final    NO GROWTH 2 DAYS Performed at [facility], [address] [place], [place] [address]    Report Status PENDING  Incomplete  Culture, blood (Routine X 2) w Reflex to ID Panel      Status: None (Preliminary result)   Collection Time: 05/23/23 10:53 AM   Specimen: BLOOD RIGHT HAND  Result Value Ref Range Status   Specimen Description BLOOD RIGHT HAND  Final   Special Requests   Final    BOTTLES DRAWN AEROBIC AND ANAEROBIC Blood Culture results may not be optimal due to an inadequate volume of blood received in culture bottles   Culture   Final    NO GROWTH < 24 HOURS Performed at [facility], [address] [place], [place] [address]    Report Status PENDING  Incomplete  Culture, blood (Routine X 2) w Reflex to ID Panel     Status: None (Preliminary result)   Collection Time: 05/23/23 10:54 AM   Specimen: BLOOD RIGHT HAND  Result Value Ref Range Status   Specimen Description BLOOD RIGHT HAND  Final   Special Requests   Final    BOTTLES DRAWN AEROBIC AND ANAEROBIC Blood Culture results may not be optimal due to an inadequate volume of blood received in culture bottles   Culture   Final    NO GROWTH < 24 HOURS Performed at [facility], [address] [place], [place] [address]    Report Status PENDING  Incomplete         Radiology Studies: MR BRAIN WO CONTRAST Result Date: 05/22/2023 CLINICAL DATA:  Mental status change EXAM: MRI HEAD WITHOUT CONTRAST TECHNIQUE: Multiplanar, multiecho pulse sequences of the brain and surrounding structures were obtained without intravenous contrast. COMPARISON:  Same-day head CT. FINDINGS: Brain: No acute infarct. No evidence of intracranial hemorrhage. Scattered and confluent FLAIR hyperintensity in the periventricular and subcortical white matter suggestive of moderate chronic  microvascular ischemic changes. Additional FLAIR signal abnormality in the pons likely related to the same. Remote lacunar infarcts in the bilateral basal ganglia and right corona radiata. No mass lesion or midline shift. Cerebellum is unremarkable. Normal appearance of midline structures. The basilar cisterns are patent. No extra-axial  fluid collections. Ventricles: Prominence of the lateral ventricles suggestive of underlying parenchymal volume loss. Vascular: Skull base flow voids are visualized. Skull and upper cervical spine: No focal abnormality. Sinuses/Orbits: Orbits are symmetric. Paranasal sinuses are clear. Other: Mastoid air cells are clear. IMPRESSION: No acute intracranial abnormality. Moderate chronic microvascular ischemic changes. Remote lacunar infarcts in the right corona radiata and bilateral basal ganglia. Mild generalized parenchymal volume loss. Electronically Signed   By: Emily Filbert M.D.   On: 05/22/2023 19:20   DG Chest Port 1 View Result Date: 05/22/2023 CLINICAL DATA:  83 year old female with questionable sepsis EXAM: PORTABLE CHEST 1 VIEW COMPARISON:  12/22/2022 FINDINGS: Cardiomediastinal silhouette unchanged in size and contour. No evidence of central vascular congestion. No interlobular septal thickening. Low lung volumes. Opacity at the left lung base obscuring the left hemidiaphragm and the left heart border. No pneumothorax No acute displaced fracture. Surgical changes of the shoulders incompletely imaged IMPRESSION: Low lung volumes with opacity at the left lung base, potentially pneumonia/consolidation or atelectasis. Electronically Signed   By: Gilmer Mor D.O.   On: 05/22/2023 13:55   CT Head Wo Contrast Result Date: 05/22/2023 CLINICAL DATA:  Mental status change, altered mental status. EXAM: CT HEAD WITHOUT CONTRAST TECHNIQUE: Contiguous axial images were obtained from the base of the skull through the vertex without intravenous contrast. RADIATION DOSE REDUCTION: This exam was performed according to the departmental dose-optimization program which includes automated exposure control, adjustment of the mA and/or kV according to patient size and/or use of iterative reconstruction technique. COMPARISON:  MRI head 12/28/2022, CT head 10/12/2022 FINDINGS: Brain: No acute intracranial hemorrhage. No CT  evidence of acute infarct. Nonspecific hypoattenuation in the periventricular and subcortical white matter favored to reflect chronic microvascular ischemic changes. Remote lacunar infarct in the right corona radiata. No edema, mass effect, or midline shift. The basilar cisterns are patent. Ventricles: The ventricles are normal. Vascular: Atherosclerotic calcifications of the carotid siphons and intracranial vertebral arteries. No hyperdense vessel. Skull: No acute or aggressive finding. Chronic appearing bilateral nasal bone deformities. Orbits: Orbits are symmetric. Sinuses: Mild mucosal thickening in the ethmoid sinuses. Other: Mastoid air cells are clear. IMPRESSION: No CT evidence of acute intracranial abnormality. Moderate chronic microvascular ischemic changes. Small remote infarct in the right corona radiata. Electronically Signed   By: Emily Filbert M.D.   On: 05/22/2023 12:48           LOS: 1 day   Time spent= 35 mins    Miguel Rota, MD [facility]  If 7PM-7AM, please contact night-coverage  05/24/2023, 10:17 AM

## 2023-05-25 DIAGNOSIS — R410 Disorientation, unspecified: Secondary | ICD-10-CM

## 2023-05-25 LAB — CBC
HCT: 29.4 % — ABNORMAL LOW (ref 36.0–46.0)
Hemoglobin: 9.6 g/dL — ABNORMAL LOW (ref 12.0–15.0)
MCH: 27.1 pg (ref 26.0–34.0)
MCHC: 32.7 g/dL (ref 30.0–36.0)
MCV: 83.1 fL (ref 80.0–100.0)
Platelets: 183 10*3/uL (ref 150–400)
RBC: 3.54 MIL/uL — ABNORMAL LOW (ref 3.87–5.11)
RDW: 15.6 % — ABNORMAL HIGH (ref 11.5–15.5)
WBC: 6.7 10*3/uL (ref 4.0–10.5)
nRBC: 0 % (ref 0.0–0.2)

## 2023-05-25 LAB — GLUCOSE, CAPILLARY
Glucose-Capillary: 95 mg/dL (ref 70–99)
Glucose-Capillary: 104 mg/dL — ABNORMAL HIGH (ref 70–99)
Glucose-Capillary: 179 mg/dL — ABNORMAL HIGH (ref 70–99)
Glucose-Capillary: 116 mg/dL — ABNORMAL HIGH (ref 70–99)
Glucose-Capillary: 119 mg/dL — ABNORMAL HIGH (ref 70–99)

## 2023-05-25 LAB — MAGNESIUM: Magnesium: 2.1 mg/dL (ref 1.7–2.4)

## 2023-05-25 LAB — BASIC METABOLIC PANEL
Anion gap: 6 (ref 5–15)
BUN: 10 mg/dL (ref 8–23)
CO2: 22 mmol/L (ref 22–32)
Calcium: 9.2 mg/dL (ref 8.9–10.3)
Chloride: 104 mmol/L (ref 98–111)
Creatinine, Ser: 0.79 mg/dL (ref 0.44–1.00)
GFR, Estimated: >60 mL/min (ref >60)
Glucose, Bld: 104 mg/dL — ABNORMAL HIGH (ref 70–99)
Potassium: 3.5 mmol/L (ref 3.5–5.1)
Sodium: 132 mmol/L — ABNORMAL LOW (ref 135–145)

## 2023-05-25 LAB — CULTURE, BLOOD (ROUTINE X 2): Special Requests: ADEQUATE

## 2023-05-25 MED ORDER — CALCIUM CARBONATE ANTACID 500 MG PO CHEW
1.0000 | CHEWABLE_TABLET | Freq: Three times a day (TID) | ORAL | Status: DC
  Filled 2023-05-25 (×2): qty 1

## 2023-05-25 NOTE — [place] FL2 (Addendum)
 [place] MEDICAID FL2 LEVEL OF CARE FORM     IDENTIFICATION  Patient Name: Tina Frank Birthdate: [birth year] Sex: female Admission Date (Current Location): 05/22/2023  [facility] and [place] Number:  Producer, television/film/video and Address:  [facility] [place], [address], [place], [place] [address]      Provider Number: [id number]  Attending Physician Name and Address:  Miguel Rota, MD  Relative Name and Phone Number:  [place], Voigt)  [phone]    Current Level of Care: Hospital Recommended Level of Care: Skilled nursing facility  Prior Approval Number:    Date Approved/Denied:   PASRR Number:  [id number]  Discharge Plan: SNF    Current Diagnoses: Patient Active Problem List   Diagnosis Date Noted   Altered mental status 05/22/2023   AMS (altered mental status) 05/22/2023   Dysuria 05/18/2023   Incontinent of urine 04/17/2023   MGUS (monoclonal gammopathy of unknown significance) 01/18/2023   Renal mass 01/04/2023   Bilateral hydronephrosis 01/04/2023   History of urinary retention 01/04/2023   Acute encephalopathy 12/22/2022   UTI (urinary tract infection) 12/22/2022   AKI (acute kidney injury) (HCC) 12/22/2022   Atrial fibrillation (HCC) 12/22/2022   Elevated troponin 12/22/2022   Chronic heart failure with preserved ejection fraction (HFpEF) (HCC) 12/22/2022   IDA (iron deficiency anemia) 12/09/2022   Slow transit constipation 11/26/2022   Pulmonary edema 11/26/2022   History of stroke 10/12/2022   Hypercalcemia 10/12/2022   Hypomagnesemia 10/12/2022   Positive anti-CCP test 10/12/2022   Bradycardia 10/12/2022   DNR (do not resuscitate)/DNI(Do Not Intubate) 10/12/2022   Arthritis of left ankle 08/15/2020   Benign neoplasm of adrenal gland 08/15/2020   Cyst of kidney, acquired 08/15/2020   Gastroesophageal reflux disease 08/15/2020   Hardening of the aorta (main artery of the heart) (HCC) 08/15/2020   History of primary  hyperparathyroidism 08/15/2020   Gout 08/15/2020   Joint pain 08/15/2020   Oral phase dysphagia 08/15/2020   Osteoporosis 08/15/2020   Prediabetes 08/15/2020   Presence of left artificial shoulder joint 08/15/2020   Pure hypercholesterolemia 08/15/2020   Visual disturbance 08/15/2020   Vitamin D deficiency 08/15/2020   Voice hoarseness 08/15/2020   Chronic arthropathy 04/25/2020   Decubitus ulcer limited to breakdown of skin (stage 2) (HCC) 04/12/2019   Hypokalemia 03/15/2019   Prosthetic joint infection (HCC) 02/09/2019   Degenerative joint disease of left shoulder 02/14/2015   Pre-syncope 10/06/2013   Leukocytosis 10/06/2013   Hypotension 10/06/2013   Osteoarthritis 07/27/2013   Essential hypertension 10/28/2011   DM (diabetes mellitus) (HCC) 10/28/2011   Hyperlipidemia 10/28/2011   Reflux esophagitis    Herpes zoster    Shingles     Orientation RESPIRATION BLADDER Height & Weight     Self, Time, Place  Normal Continent Weight: 121 lb 0.5 oz (54.9 kg) Height:  5\' 5"  (165.1 cm)  BEHAVIORAL SYMPTOMS/MOOD NEUROLOGICAL BOWEL NUTRITION STATUS      Continent Diet:Diabetic  AMBULATORY STATUS COMMUNICATION OF NEEDS Skin   Limited Assist Verbally Other (Comment) (wound.  04/25/22 Laceration Head Left Laceration with stitches to Left forehead)                       Personal Care Assistance Level of Assistance  Bathing, Feeding, Dressing Bathing Assistance: Limited assistance Feeding assistance: Limited assistance Dressing Assistance: Limited assistance     Functional Limitations Info  Sight, Hearing, Speech Sight Info: Adequate Hearing Info: Impaired Speech Info: Adequate  SPECIAL CARE FACTORS FREQUENCY  PT (By licensed PT), OT (By licensed OT)     PT Frequency: Eval and Treat OT Frequency: Eval and Treat            Contractures Contractures Info: Not present    Additional Factors Info  Code Status, Allergies Code Status Info: DNR Allergies Info:  Abaloparatide  Mobic (Meloxicam)  Crestor (Rosuvastatin)  Norvasc (Amlodipine Besylate)  Simvastatin  Sulfa Antibiotics  Zetia (Ezetimibe)           Current Medications (05/25/2023):  This is the current hospital active medication list Current Facility-Administered Medications  Medication Dose Route Frequency Provider Last Rate Last Admin   acetaminophen (TYLENOL) tablet 650 mg  650 mg Oral Q6H PRN Amin, Ankit C, MD   650 mg at 05/24/23 1734   Or   acetaminophen (TYLENOL) suppository 650 mg  650 mg Rectal Q6H PRN Amin, Ankit C, MD       allopurinol (ZYLOPRIM) tablet 300 mg  300 mg Oral Daily Amin, Ankit C, MD   300 mg at 05/25/23 0906   atorvastatin (LIPITOR) tablet 10 mg  10 mg Oral QHS Amin, Ankit C, MD   10 mg at 05/24/23 2117   bisacodyl (DULCOLAX) EC tablet 5 mg  5 mg Oral Daily PRN Amin, Ankit C, MD       cefTRIAXone (ROCEPHIN) 1 g in sodium chloride 0.9 % 100 mL IVPB  1 g Intravenous Q24H Amin, Ankit C, MD 200 mL/hr at 05/24/23 1153 1 g at 05/24/23 1153   cyanocobalamin (VITAMIN B12) tablet 1,000 mcg  1,000 mcg Oral Daily Amin, Ankit C, MD   1,000 mcg at 05/25/23 0906   docusate sodium (COLACE) capsule 100 mg  100 mg Oral BID Amin, Ankit C, MD   100 mg at 05/25/23 0906   famotidine (PEPCID) tablet 20 mg  20 mg Oral BID Amin, Ankit C, MD   20 mg at 05/25/23 0906   ferrous sulfate 220 (44 Fe) MG/5ML solution 330 mg  330 mg Oral Once per day on Monday Wednesday Friday Nelson Chimes, Ankit C, MD   330 mg at 05/25/23 0906   guaiFENesin (MUCINEX) 12 hr tablet 600 mg  600 mg Oral BID PRN Amin, Ankit C, MD       hydrALAZINE (APRESOLINE) injection 10 mg  10 mg Intravenous Q4H PRN Amin, Ankit C, MD       hydrALAZINE (APRESOLINE) tablet 100 mg  100 mg Oral TID Amin, Ankit C, MD   100 mg at 05/24/23 2121   HYDROmorphone (DILAUDID) injection 0.5-1 mg  0.5-1 mg Intravenous Q2H PRN Amin, Ankit C, MD       insulin aspart (novoLOG) injection 0-6 Units  0-6 Units Subcutaneous TID WC Amin, Ankit C, MD   1 Units at  05/24/23 1417   ipratropium-albuterol (DUONEB) 0.5-2.5 (3) MG/3ML nebulizer solution 3 mL  3 mL Nebulization Q4H PRN Amin, Ankit C, MD       metoprolol tartrate (LOPRESSOR) injection 5 mg  5 mg Intravenous Q4H PRN Amin, Ankit C, MD       nicotine (NICODERM CQ - dosed in mg/24 hours) patch 21 mg  21 mg Transdermal Daily PRN Amin, Ankit C, MD       ondansetron (ZOFRAN) tablet 4 mg  4 mg Oral Q6H PRN Amin, Ankit C, MD       Or   ondansetron (ZOFRAN) injection 4 mg  4 mg Intravenous Q6H PRN Amin, Doreen Salvage, MD  oxyCODONE (Oxy IR/ROXICODONE) immediate release tablet 5 mg  5 mg Oral Q4H PRN Amin, Ankit C, MD       pantoprazole (PROTONIX) EC tablet 40 mg  40 mg Oral BID Amin, Ankit C, MD   40 mg at 05/25/23 4098   Rivaroxaban (XARELTO) tablet 15 mg  15 mg Oral Q supper Amin, Ankit C, MD   15 mg at 05/24/23 1734   senna-docusate (Senokot-S) tablet 1 tablet  1 tablet Oral QHS PRN Amin, Ankit C, MD       sodium phosphate (FLEET) enema 1 enema  1 enema Rectal Once PRN Amin, Ankit C, MD       tamsulosin (FLOMAX) capsule 0.4 mg  0.4 mg Oral Daily Amin, Ankit C, MD   0.4 mg at 05/25/23 0906   traZODone (DESYREL) tablet 50 mg  50 mg Oral QHS PRN Miguel Rota, MD        Medication List:   Please see discharge summary for a list of discharge medications.   Relevant Imaging Results:  Relevant Lab Results:   Additional Information SSN:[id number]  Shamica Moree A [place], LCSW

## 2023-05-25 NOTE — Discharge Instructions (Signed)

## 2023-05-25 NOTE — Discharge Summary (Signed)
 Physician Discharge Summary  Tina Frank:[id number] DOB: 12-19-1940 DOA: 05/22/2023  PCP: Sigmund Hazel, MD  Admit date: 05/22/2023 Discharge date: 05/26/2023  Admitted From: Long-term care Disposition: SNF  Recommendations for Outpatient Follow-up:  Follow up with PCP in 1-2 weeks Please obtain BMP/CBC in one week your next doctors visit.     Discharge Condition: Stable CODE STATUS: DNR Diet recommendation: Diabetic  Brief/Interim Summary: Brief Narrative:   83 y.o. female with medical history significant of GERD, DM 2, diastolic CHF, paroxysmal atrial fibrillation on Xarelto, HLD, HTN, vertigo, osteoarthritis sent to the hospital from her facility for evaluation of mental status change.  History is limited as patient is very poor historian.  Thus far CT head, MRI brain are negative for any acute pathology but does show chronic changes.  Metabolic workup is also negative.  UA shows bacteriuria but no signs of active infection.  But given prior presentation, started on IV Rocephin.  PT/OT recommended HH/SNF.  TOC consulted. Patient completed 3 days of Rocephin in the hospital and doing much better feeling back to baseline.   Patient is currently awaiting placement at SNF  Assessment & Plan:  Principal Problem:   AMS (altered mental status) Active Problems:   History of stroke   Essential hypertension   DM (diabetes mellitus) (HCC)   Hyperlipidemia   Atrial fibrillation (HCC)    Acute metabolic encephalopathy, improved Urinary tract infection - Unclear etiology.  Thus far most of the workup is negative including CT head, MRI brain.  TSH, vitamin D, B12 are all normal. -After prolonged discussion with son, I offered to treat her with 3 days of Rocephin which significantly improved her condition. -Similar presentation about 6 months ago when her MRI and EEG did not show any significant findings. - LFTs and lipase are normal  Bacteriuria Gram-positive bacteremia - GPC  likely contaminant.  Repeat cultures remain negative. - Completed 3 days of Rocephin on 3/17.   AKI on CKD stage II, resolved - Baseline creatinine 0.6, admission creatinine 1.05.  Resolved and back to baseline   Atrial fibrillation, paroxysmal - Continue home Xarelto.  Echocardiogram August 2024 showed preserved EF.   Essential hypertension -Continue home hydralazine.  IV as needed   GERD - PPI   Diabetes mellitus type 2 - Sliding scale and Accu-Chek   Anemia of chronic disease Hemoglobin around baseline of 10   History of vertigo - On outpatient meclizine    DVT prophylaxis: Xarelto    Code Status: Limited: Do not attempt resuscitation (DNR) -DNR-LIMITED -Do Not Intubate/DNI  Family Communication: Son is at bedside Discharge, pending SNF placement  Subjective: Seen at bedside, no complaints.  Awaiting SNF placement  Examination:  General exam: Appears calm and comfortable  Respiratory system: Clear to auscultation. Respiratory effort normal. Cardiovascular system: S1 & S2 heard, RRR. No JVD, murmurs, rubs, gallops or clicks. No pedal edema. Gastrointestinal system: Abdomen is nondistended, soft and nontender. No organomegaly or masses felt. Normal bowel sounds heard. Central nervous system: Alert and oriented to name and place. No focal neurological deficits. Words at times Extremities: Symmetric 5 x 5 power. Skin: No rashes, lesions or ulcers Psychiatry: Judgement and insight appear poor    Discharge Diagnoses:  Principal Problem:   AMS (altered mental status) Active Problems:   History of stroke   Essential hypertension   DM (diabetes mellitus) (HCC)   Hyperlipidemia   Atrial fibrillation (HCC)      Discharge Exam: Vitals:   05/26/23 0441 05/26/23 1478  BP: (!) 118/45 132/74  Pulse: (!) 55 (!) 57  Resp: 18 16  Temp:  97.8 F (36.6 C)  SpO2: 96% 94%   Vitals:   05/25/23 2157 05/26/23 0419 05/26/23 0441 05/26/23 0738  BP: 120/85 (!) 138/49 (!)  118/45 132/74  Pulse:  (!) 118 (!) 55 (!) 57  Resp:  18 18 16   Temp:  97.8 F (36.6 C)  97.8 F (36.6 C)  TempSrc:  Oral  Oral  SpO2:  98% 96% 94%  Weight:  51.3 kg    Height:          Discharge Instructions   Allergies as of 05/26/2023       Reactions   Abaloparatide Other (See Comments)   "Burred vision, lightheaded" (patient does not recall this in 2024)   Mobic [meloxicam] Hypertension   Crestor [rosuvastatin] Other (See Comments)   Muscle aches   Norvasc [amlodipine Besylate] Swelling, Other (See Comments)   Ankle swelling   Simvastatin Other (See Comments)   Muscle aches, high LFTs   Sulfa Antibiotics Rash   Zetia [ezetimibe] Other (See Comments)   Muscle aches        Medication List     STOP taking these medications    magnesium oxide 400 (240 Mg) MG tablet Commonly known as: MAG-OX   potassium chloride SA 20 MEQ tablet Commonly known as: KLOR-CON M       TAKE these medications    acetaminophen 325 MG tablet Commonly known as: TYLENOL Take 650 mg by mouth every 4 (four) hours as needed for moderate pain (pain score 4-6).   allopurinol 300 MG tablet Commonly known as: ZYLOPRIM Take 300 mg by mouth daily.   atorvastatin 10 MG tablet Commonly known as: LIPITOR Take 10 mg by mouth daily.   cloNIDine 0.1 MG tablet Commonly known as: CATAPRES Take 0.1 mg by mouth daily as needed (for hypertension- if systolic b/p is over 160).   cyanocobalamin 1000 MCG tablet Take 1,000 mcg by mouth daily.   diclofenac Sodium 1 % Gel Commonly known as: VOLTAREN Apply 2-4 g topically 4 (four) times daily.   docusate sodium 100 MG capsule Commonly known as: COLACE Take 100 mg by mouth 2 (two) times daily.   famotidine 20 MG tablet Commonly known as: PEPCID Take 1 tablet (20 mg total) by mouth 2 (two) times daily.   ferrous sulfate 220 (44 Fe) MG/5ML solution Take 7.5 mLs by mouth 3 (three) times a week. Monday, Wednesday, Friday   hydrALAZINE 100 MG  tablet Commonly known as: APRESOLINE Take 1 tablet (100 mg total) by mouth 3 (three) times daily.   meclizine 12.5 MG tablet Commonly known as: ANTIVERT Take 1 tablet (12.5 mg total) by mouth 3 (three) times daily as needed for dizziness.   pantoprazole 40 MG tablet Commonly known as: PROTONIX Take 1 tablet (40 mg total) by mouth 2 (two) times daily.   Rivaroxaban 15 MG Tabs tablet Commonly known as: XARELTO Take 1 tablet (15 mg total) by mouth daily with supper.   senna-docusate 8.6-50 MG tablet Commonly known as: Senokot-S Take 1 tablet by mouth 2 (two) times daily.   sucralfate 1 GM/10ML suspension Commonly known as: Carafate Take 10 mLs (1 g total) by mouth 4 (four) times daily -  with meals and at bedtime. Give  1 hour before meals and at bedtime on an empt stomach   tamsulosin 0.4 MG Caps capsule Commonly known as: FLOMAX Take 1 capsule (0.4 mg total) by  mouth daily.   Zinc Oxide 10 % Oint Apply 1 Application topically See admin instructions. 1 application to buttocks every shift for redness        Contact information for follow-up providers     [facility] Follow up.   Specialty: Skilled Nursing and Assisted Living Facility Contact information: [address] [place] [address] [phone]         Sigmund Hazel, MD Follow up in 1 week(s).   Specialty: Family Medicine Contact information: [address] [place] [address] [phone]              Contact information for after-discharge care     Destination     [facility] .   Service: Assisted Living Contact information: [address] [place] [address] [phone]                    Allergies  Allergen Reactions   Abaloparatide Other (See Comments)    "Burred vision, lightheaded" (patient does not recall this in 2024)   Mobic [Meloxicam] Hypertension   Crestor [Rosuvastatin] Other (See Comments)    Muscle  aches   Norvasc [Amlodipine Besylate] Swelling and Other (See Comments)    Ankle swelling   Simvastatin Other (See Comments)    Muscle aches, high LFTs   Sulfa Antibiotics Rash   Zetia [Ezetimibe] Other (See Comments)    Muscle aches    You were cared for by a hospitalist during your hospital stay. If you have any questions about your discharge medications or the care you received while you were in the hospital after you are discharged, you can call the unit and asked to speak with the hospitalist on call if the hospitalist that took care of you is not available. Once you are discharged, your primary care physician will handle any further medical issues. Please note that no refills for any discharge medications will be authorized once you are discharged, as it is imperative that you return to your primary care physician (or establish a relationship with a primary care physician if you do not have one) for your aftercare needs so that they can reassess your need for medications and monitor your lab values.  You were cared for by a hospitalist during your hospital stay. If you have any questions about your discharge medications or the care you received while you were in the hospital after you are discharged, you can call the unit and asked to speak with the hospitalist on call if the hospitalist that took care of you is not available. Once you are discharged, your primary care physician will handle any further medical issues. Please note that NO REFILLS for any discharge medications will be authorized once you are discharged, as it is imperative that you return to your primary care physician (or establish a relationship with a primary care physician if you do not have one) for your aftercare needs so that they can reassess your need for medications and monitor your lab values.  Please request your Prim.MD to go over all Hospital Tests and Procedure/Radiological results at the follow up, please get all  Hospital records sent to your Prim MD by signing hospital release before you go home.  Get CBC, CMP, 2 view Chest X ray checked  by Primary MD during your next visit or SNF MD in 5-7 days ( we routinely change or add medications that can affect your baseline labs and fluid status, therefore  we recommend that you get the mentioned basic workup next visit with your PCP, your PCP may decide not to get them or add new tests based on their clinical decision)  On your next visit with your primary care physician please Get Medicines reviewed and adjusted.  If you experience worsening of your admission symptoms, develop shortness of breath, life threatening emergency, suicidal or homicidal thoughts you must seek medical attention immediately by calling 911 or calling your MD immediately  if symptoms less severe.  You Must read complete instructions/literature along with all the possible adverse reactions/side effects for all the Medicines you take and that have been prescribed to you. Take any new Medicines after you have completely understood and accpet all the possible adverse reactions/side effects.   Do not drive, operate heavy machinery, perform activities at heights, swimming or participation in water activities or provide baby sitting services if your were admitted for syncope or siezures until you have seen by Primary MD or a Neurologist and advised to do so again.  Do not drive when taking Pain medications.   Procedures/Studies: MR BRAIN WO CONTRAST Result Date: 05/22/2023 CLINICAL DATA:  Mental status change EXAM: MRI HEAD WITHOUT CONTRAST TECHNIQUE: Multiplanar, multiecho pulse sequences of the brain and surrounding structures were obtained without intravenous contrast. COMPARISON:  Same-day head CT. FINDINGS: Brain: No acute infarct. No evidence of intracranial hemorrhage. Scattered and confluent FLAIR hyperintensity in the periventricular and subcortical white matter suggestive of moderate  chronic microvascular ischemic changes. Additional FLAIR signal abnormality in the pons likely related to the same. Remote lacunar infarcts in the bilateral basal ganglia and right corona radiata. No mass lesion or midline shift. Cerebellum is unremarkable. Normal appearance of midline structures. The basilar cisterns are patent. No extra-axial fluid collections. Ventricles: Prominence of the lateral ventricles suggestive of underlying parenchymal volume loss. Vascular: Skull base flow voids are visualized. Skull and upper cervical spine: No focal abnormality. Sinuses/Orbits: Orbits are symmetric. Paranasal sinuses are clear. Other: Mastoid air cells are clear. IMPRESSION: No acute intracranial abnormality. Moderate chronic microvascular ischemic changes. Remote lacunar infarcts in the right corona radiata and bilateral basal ganglia. Mild generalized parenchymal volume loss. Electronically Signed   By: Emily Filbert M.D.   On: 05/22/2023 19:20   DG Chest Port 1 View Result Date: 05/22/2023 CLINICAL DATA:  83 year old female with questionable sepsis EXAM: PORTABLE CHEST 1 VIEW COMPARISON:  12/22/2022 FINDINGS: Cardiomediastinal silhouette unchanged in size and contour. No evidence of central vascular congestion. No interlobular septal thickening. Low lung volumes. Opacity at the left lung base obscuring the left hemidiaphragm and the left heart border. No pneumothorax No acute displaced fracture. Surgical changes of the shoulders incompletely imaged IMPRESSION: Low lung volumes with opacity at the left lung base, potentially pneumonia/consolidation or atelectasis. Electronically Signed   By: Gilmer Mor D.O.   On: 05/22/2023 13:55   CT Head Wo Contrast Result Date: 05/22/2023 CLINICAL DATA:  Mental status change, altered mental status. EXAM: CT HEAD WITHOUT CONTRAST TECHNIQUE: Contiguous axial images were obtained from the base of the skull through the vertex without intravenous contrast. RADIATION DOSE  REDUCTION: This exam was performed according to the departmental dose-optimization program which includes automated exposure control, adjustment of the mA and/or kV according to patient size and/or use of iterative reconstruction technique. COMPARISON:  MRI head 12/28/2022, CT head 10/12/2022 FINDINGS: Brain: No acute intracranial hemorrhage. No CT evidence of acute infarct. Nonspecific hypoattenuation in the periventricular and subcortical white matter favored to reflect chronic microvascular  ischemic changes. Remote lacunar infarct in the right corona radiata. No edema, mass effect, or midline shift. The basilar cisterns are patent. Ventricles: The ventricles are normal. Vascular: Atherosclerotic calcifications of the carotid siphons and intracranial vertebral arteries. No hyperdense vessel. Skull: No acute or aggressive finding. Chronic appearing bilateral nasal bone deformities. Orbits: Orbits are symmetric. Sinuses: Mild mucosal thickening in the ethmoid sinuses. Other: Mastoid air cells are clear. IMPRESSION: No CT evidence of acute intracranial abnormality. Moderate chronic microvascular ischemic changes. Small remote infarct in the right corona radiata. Electronically Signed   By: Emily Filbert M.D.   On: 05/22/2023 12:48   DG ESOPHAGUS W SINGLE CM (SOL OR THIN BA) Result Date: 05/19/2023 CLINICAL DATA:  Reflux esophagitis. EXAM: ESOPHOGRAM/BARIUM SWALLOW TECHNIQUE: Single contrast examination was performed using thin barium. FLUOROSCOPY: Radiation Exposure Index (as provided by the fluoroscopic device): 5.2 mGy Kerma COMPARISON:  None Available. FINDINGS: The examination was severely limited due to limited patient mobility. The patient was unable to stand or get into a near prone/RAO position. The examination was therefore performed with the patient recumbent in the RPO and supine positions. The pharynx and cervical esophagus were not well evaluated. The thoracic esophagus appears normal in caliber  without evidence of a gross mass or stricture. No substantial esophageal dysmotility was seen for age. There is a small sliding hiatal hernia. No gastroesophageal reflux was observed. A barium tablet was not administered. IMPRESSION: 1. Severely limited examination as detailed above. 2. Small sliding hiatal hernia. No gastroesophageal reflux was observed during this examination. 3. No evidence of an esophageal stricture. Electronically Signed   By: Sebastian Ache M.D.   On: 05/19/2023 15:59     The results of significant diagnostics from this hospitalization (including imaging, microbiology, ancillary and laboratory) are listed below for reference.     Microbiology: Recent Results (from the past 240 hours)  Blood Culture (routine x 2)     Status: Abnormal   Collection Time: 05/22/23 11:16 AM   Specimen: BLOOD LEFT ARM  Result Value Ref Range Status   Specimen Description BLOOD LEFT ARM  Final   Special Requests   Final    BOTTLES DRAWN AEROBIC AND ANAEROBIC Blood Culture adequate volume   Culture  Setup Time   Final    GRAM POSITIVE COCCI IN BOTH AEROBIC AND ANAEROBIC BOTTLES Organism ID to follow CRITICAL RESULT CALLED TO, READ BACK BY AND VERIFIED WITH: V BRYK,PHARMD@[id number]  05/23/23 MK    Culture (A)  Final    STAPHYLOCOCCUS CAPITIS THE SIGNIFICANCE OF ISOLATING THIS ORGANISM FROM A SINGLE SET OF BLOOD CULTURES WHEN MULTIPLE SETS ARE DRAWN IS UNCERTAIN. PLEASE NOTIFY THE MICROBIOLOGY DEPARTMENT WITHIN ONE WEEK IF SPECIATION AND SENSITIVITIES ARE REQUIRED. Performed at [facility], [address] [place], [place] [address]    Report Status 05/25/2023 FINAL  Final  Resp panel by RT-PCR (RSV, Flu A&B, Covid) Anterior Nasal Swab     Status: None   Collection Time: 05/22/23 11:16 AM   Specimen: Anterior Nasal Swab  Result Value Ref Range Status   SARS Coronavirus 2 by RT PCR NEGATIVE NEGATIVE Final   Influenza A by PCR NEGATIVE NEGATIVE Final   Influenza B by PCR NEGATIVE NEGATIVE  Final    Comment: (NOTE) The Xpert Xpress SARS-CoV-2/FLU/RSV plus assay is intended as an aid in the diagnosis of influenza from Nasopharyngeal swab specimens and should not be used as a sole basis for treatment. Nasal washings and aspirates are unacceptable for Xpert Xpress SARS-CoV-2/FLU/RSV  testing.  Fact Sheet for Patients: BloggerCourse.com  Fact Sheet for Healthcare Providers: SeriousBroker.it  This test is not yet approved or cleared by the [place] FDA and has been authorized for detection and/or diagnosis of SARS-CoV-2 by FDA under an Emergency Use Authorization (EUA). This EUA will remain in effect (meaning this test can be used) for the duration of the COVID-19 declaration under Section 564(b)(1) of the Act, 21 U.S.C. section 360bbb-3(b)(1), unless the authorization is terminated or revoked.     Resp Syncytial Virus by PCR NEGATIVE NEGATIVE Final    Comment: (NOTE) Fact Sheet for Patients: BloggerCourse.com  Fact Sheet for Healthcare Providers: SeriousBroker.it  This test is not yet approved or cleared by the [place] FDA and has been authorized for detection and/or diagnosis of SARS-CoV-2 by FDA under an Emergency Use Authorization (EUA). This EUA will remain in effect (meaning this test can be used) for the duration of the COVID-19 declaration under Section 564(b)(1) of the Act, 21 U.S.C. section 360bbb-3(b)(1), unless the authorization is terminated or revoked.  Performed at [facility], [address] [place], [place] [address]   Blood Culture ID Panel (Reflexed)     Status: Abnormal   Collection Time: 05/22/23 11:16 AM  Result Value Ref Range Status   Enterococcus faecalis NOT DETECTED NOT DETECTED Final   Enterococcus Faecium NOT DETECTED NOT DETECTED Final   Listeria monocytogenes NOT DETECTED NOT DETECTED Final   Staphylococcus species  DETECTED (A) NOT DETECTED Final    Comment: CRITICAL RESULT CALLED TO, READ BACK BY AND VERIFIED WITH: V BRYK,PHARMD@03 /15/25 MK    Staphylococcus aureus (BCID) NOT DETECTED NOT DETECTED Final   Staphylococcus epidermidis NOT DETECTED NOT DETECTED Final   Staphylococcus lugdunensis NOT DETECTED NOT DETECTED Final   Streptococcus species NOT DETECTED NOT DETECTED Final   Streptococcus agalactiae NOT DETECTED NOT DETECTED Final   Streptococcus pneumoniae NOT DETECTED NOT DETECTED Final   Streptococcus pyogenes NOT DETECTED NOT DETECTED Final   A.calcoaceticus-baumannii NOT DETECTED NOT DETECTED Final   Bacteroides fragilis NOT DETECTED NOT DETECTED Final   Enterobacterales NOT DETECTED NOT DETECTED Final   Enterobacter cloacae complex NOT DETECTED NOT DETECTED Final   Escherichia coli NOT DETECTED NOT DETECTED Final   Klebsiella aerogenes NOT DETECTED NOT DETECTED Final   Klebsiella oxytoca NOT DETECTED NOT DETECTED Final   Klebsiella pneumoniae NOT DETECTED NOT DETECTED Final   Proteus species NOT DETECTED NOT DETECTED Final   Salmonella species NOT DETECTED NOT DETECTED Final   Serratia marcescens NOT DETECTED NOT DETECTED Final   Haemophilus influenzae NOT DETECTED NOT DETECTED Final   Neisseria meningitidis NOT DETECTED NOT DETECTED Final   Pseudomonas aeruginosa NOT DETECTED NOT DETECTED Final   Stenotrophomonas maltophilia NOT DETECTED NOT DETECTED Final   Candida albicans NOT DETECTED NOT DETECTED Final   Candida auris NOT DETECTED NOT DETECTED Final   Candida glabrata NOT DETECTED NOT DETECTED Final   Candida krusei NOT DETECTED NOT DETECTED Final   Candida parapsilosis NOT DETECTED NOT DETECTED Final   Candida tropicalis NOT DETECTED NOT DETECTED Final   Cryptococcus neoformans/gattii NOT DETECTED NOT DETECTED Final    Comment: Performed at [facility], [address], [place], [place] [address]  Blood Culture (routine x 2)     Status: None (Preliminary result)    Collection Time: 05/22/23 11:21 AM   Specimen: BLOOD RIGHT ARM  Result Value Ref Range Status   Specimen Description BLOOD RIGHT ARM  Final   Special Requests  Final    BOTTLES DRAWN AEROBIC AND ANAEROBIC Blood Culture results may not be optimal due to an inadequate volume of blood received in culture bottles   Culture   Final    NO GROWTH 4 DAYS Performed at [facility], [address] [place], [place] [address]    Report Status PENDING  Incomplete  Culture, blood (Routine X 2) w Reflex to ID Panel     Status: None (Preliminary result)   Collection Time: 05/23/23 10:53 AM   Specimen: BLOOD RIGHT HAND  Result Value Ref Range Status   Specimen Description BLOOD RIGHT HAND  Final   Special Requests   Final    BOTTLES DRAWN AEROBIC AND ANAEROBIC Blood Culture results may not be optimal due to an inadequate volume of blood received in culture bottles   Culture   Final    NO GROWTH 3 DAYS Performed at [facility], [address] [place], [place] [address]    Report Status PENDING  Incomplete  Culture, blood (Routine X 2) w Reflex to ID Panel     Status: None (Preliminary result)   Collection Time: 05/23/23 10:54 AM   Specimen: BLOOD RIGHT HAND  Result Value Ref Range Status   Specimen Description BLOOD RIGHT HAND  Final   Special Requests   Final    BOTTLES DRAWN AEROBIC AND ANAEROBIC Blood Culture results may not be optimal due to an inadequate volume of blood received in culture bottles   Culture   Final    NO GROWTH 3 DAYS Performed at [facility], [address], [place], [place] [address]    Report Status PENDING  Incomplete     Labs: BNP (last 3 results) Recent Labs    10/14/22 0248  BNP 950.8*   Basic Metabolic Panel: Recent Labs  Lab 05/22/23 1116 05/23/23 0622 05/24/23 0701 05/25/23 0603 05/26/23 0540  NA 135 133* 133* 132* 133*  K 4.1 3.6 3.7 3.5 3.3*  CL 105 105 103 104 106  CO2 22 22 21* 22 20*  GLUCOSE 97 81 96 104* 93  BUN 12  10 12 10 10   CREATININE 1.05* 0.89 0.80 0.79 0.67  CALCIUM 10.3 9.8 9.7 9.2 9.3  MG  --  1.5* 1.4* 2.1 1.7   Liver Function Tests: Recent Labs  Lab 05/22/23 1116  AST 16  ALT 7  ALKPHOS 77  BILITOT 0.8  PROT 6.1*  ALBUMIN 2.8*   Recent Labs  Lab 05/22/23 1450  LIPASE 28   Recent Labs  Lab 05/22/23 1450  AMMONIA 13   CBC: Recent Labs  Lab 05/22/23 1116 05/23/23 0622 05/24/23 0701 05/25/23 0603 05/26/23 0540  WBC 8.8 6.5 7.6 6.7 6.8  NEUTROABS 6.3  --   --   --   --   HGB 10.7* 11.0* 10.7* 9.6* 9.2*  HCT 34.2* 33.9* 33.2* 29.4* 28.8*  MCV 86.8 83.9 84.3 83.1 84.7  PLT 211 201 206 183 181   Cardiac Enzymes: No results for input(s): "CKTOTAL", "CKMB", "CKMBINDEX", "TROPONINI" in the last 168 hours. BNP: Invalid input(s): "POCBNP" CBG: Recent Labs  Lab 05/25/23 1206 05/25/23 1549 05/25/23 1958 05/26/23 0736 05/26/23 1150  GLUCAP 179* 116* 119* 97 154*   D-Dimer No results for input(s): "DDIMER" in the last 72 hours. Hgb A1c No results for input(s): "HGBA1C" in the last 72 hours. Lipid Profile No results for input(s): "CHOL", "HDL", "LDLCALC", "TRIG", "CHOLHDL", "LDLDIRECT" in the last 72 hours. Thyroid function studies No  results for input(s): "TSH", "T4TOTAL", "T3FREE", "THYROIDAB" in the last 72 hours.  Invalid input(s): "FREET3"  Anemia work up No results for input(s): "VITAMINB12", "FOLATE", "FERRITIN", "TIBC", "IRON", "RETICCTPCT" in the last 72 hours.  Urinalysis    Component Value Date/Time   COLORURINE YELLOW 05/22/2023 1116   APPEARANCEUR CLEAR 05/22/2023 1116   LABSPEC 1.010 05/22/2023 1116   PHURINE 6.5 05/22/2023 1116   GLUCOSEU NEGATIVE 05/22/2023 1116   HGBUR NEGATIVE 05/22/2023 1116   BILIRUBINUR NEGATIVE 05/22/2023 1116   KETONESUR NEGATIVE 05/22/2023 1116   PROTEINUR NEGATIVE 05/22/2023 1116   UROBILINOGEN 0.2 10/05/2013 2122   NITRITE NEGATIVE 05/22/2023 1116   LEUKOCYTESUR NEGATIVE 05/22/2023 1116   Sepsis Labs Recent  Labs  Lab 05/23/23 0622 05/24/23 0701 05/25/23 0603 05/26/23 0540  WBC 6.5 7.6 6.7 6.8   Microbiology Recent Results (from the past 240 hours)  Blood Culture (routine x 2)     Status: Abnormal   Collection Time: 05/22/23 11:16 AM   Specimen: BLOOD LEFT ARM  Result Value Ref Range Status   Specimen Description BLOOD LEFT ARM  Final   Special Requests   Final    BOTTLES DRAWN AEROBIC AND ANAEROBIC Blood Culture adequate volume   Culture  Setup Time   Final    GRAM POSITIVE COCCI IN BOTH AEROBIC AND ANAEROBIC BOTTLES Organism ID to follow CRITICAL RESULT CALLED TO, READ BACK BY AND VERIFIED WITH: V BRYK,PHARMD@0750  05/23/23 MK    Culture (A)  Final    STAPHYLOCOCCUS CAPITIS THE SIGNIFICANCE OF ISOLATING THIS ORGANISM FROM A SINGLE SET OF BLOOD CULTURES WHEN MULTIPLE SETS ARE DRAWN IS UNCERTAIN. PLEASE NOTIFY THE MICROBIOLOGY DEPARTMENT WITHIN ONE WEEK IF SPECIATION AND SENSITIVITIES ARE REQUIRED. Performed at [facility], [address], [place], [place] [address]    Report Status 05/25/2023 FINAL  Final  Resp panel by RT-PCR (RSV, Flu A&B, Covid) Anterior Nasal Swab     Status: None   Collection Time: 05/22/23 11:16 AM   Specimen: Anterior Nasal Swab  Result Value Ref Range Status   SARS Coronavirus 2 by RT PCR NEGATIVE NEGATIVE Final   Influenza A by PCR NEGATIVE NEGATIVE Final   Influenza B by PCR NEGATIVE NEGATIVE Final    Comment: (NOTE) The Xpert Xpress SARS-CoV-2/FLU/RSV plus assay is intended as an aid in the diagnosis of influenza from Nasopharyngeal swab specimens and should not be used as a sole basis for treatment. Nasal washings and aspirates are unacceptable for Xpert Xpress SARS-CoV-2/FLU/RSV testing.  Fact Sheet for Patients: BloggerCourse.com  Fact Sheet for Healthcare Providers: SeriousBroker.it  This test is not yet approved or cleared by the [place] FDA and has been authorized for  detection and/or diagnosis of SARS-CoV-2 by FDA under an Emergency Use Authorization (EUA). This EUA will remain in effect (meaning this test can be used) for the duration of the COVID-19 declaration under Section 564(b)(1) of the Act, 21 U.S.C. section 360bbb-3(b)(1), unless the authorization is terminated or revoked.     Resp Syncytial Virus by PCR NEGATIVE NEGATIVE Final    Comment: (NOTE) Fact Sheet for Patients: BloggerCourse.com  Fact Sheet for Healthcare Providers: SeriousBroker.it  This test is not yet approved or cleared by the [place] FDA and has been authorized for detection and/or diagnosis of SARS-CoV-2 by FDA under an Emergency Use Authorization (EUA). This EUA will remain in effect (meaning this test can be used) for the duration of the COVID-19 declaration under Section 564(b)(1) of the Act, 21 U.S.C. section 360bbb-3(b)(1), unless  the authorization is terminated or revoked.  Performed at [facility], [address] [place], [place] [address]   Blood Culture ID Panel (Reflexed)     Status: Abnormal   Collection Time: 05/22/23 11:16 AM  Result Value Ref Range Status   Enterococcus faecalis NOT DETECTED NOT DETECTED Final   Enterococcus Faecium NOT DETECTED NOT DETECTED Final   Listeria monocytogenes NOT DETECTED NOT DETECTED Final   Staphylococcus species DETECTED (A) NOT DETECTED Final    Comment: CRITICAL RESULT CALLED TO, READ BACK BY AND VERIFIED WITH: V BRYK,PHARMD@03 /15/25 MK    Staphylococcus aureus (BCID) NOT DETECTED NOT DETECTED Final   Staphylococcus epidermidis NOT DETECTED NOT DETECTED Final   Staphylococcus lugdunensis NOT DETECTED NOT DETECTED Final   Streptococcus species NOT DETECTED NOT DETECTED Final   Streptococcus agalactiae NOT DETECTED NOT DETECTED Final   Streptococcus pneumoniae NOT DETECTED NOT DETECTED Final   Streptococcus pyogenes NOT DETECTED NOT DETECTED Final    A.calcoaceticus-baumannii NOT DETECTED NOT DETECTED Final   Bacteroides fragilis NOT DETECTED NOT DETECTED Final   Enterobacterales NOT DETECTED NOT DETECTED Final   Enterobacter cloacae complex NOT DETECTED NOT DETECTED Final   Escherichia coli NOT DETECTED NOT DETECTED Final   Klebsiella aerogenes NOT DETECTED NOT DETECTED Final   Klebsiella oxytoca NOT DETECTED NOT DETECTED Final   Klebsiella pneumoniae NOT DETECTED NOT DETECTED Final   Proteus species NOT DETECTED NOT DETECTED Final   Salmonella species NOT DETECTED NOT DETECTED Final   Serratia marcescens NOT DETECTED NOT DETECTED Final   Haemophilus influenzae NOT DETECTED NOT DETECTED Final   Neisseria meningitidis NOT DETECTED NOT DETECTED Final   Pseudomonas aeruginosa NOT DETECTED NOT DETECTED Final   Stenotrophomonas maltophilia NOT DETECTED NOT DETECTED Final   Candida albicans NOT DETECTED NOT DETECTED Final   Candida auris NOT DETECTED NOT DETECTED Final   Candida glabrata NOT DETECTED NOT DETECTED Final   Candida krusei NOT DETECTED NOT DETECTED Final   Candida parapsilosis NOT DETECTED NOT DETECTED Final   Candida tropicalis NOT DETECTED NOT DETECTED Final   Cryptococcus neoformans/gattii NOT DETECTED NOT DETECTED Final    Comment: Performed at [facility], [address] [place], [place] [address]  Blood Culture (routine x 2)     Status: None (Preliminary result)   Collection Time: 05/22/23 11:21 AM   Specimen: BLOOD RIGHT ARM  Result Value Ref Range Status   Specimen Description BLOOD RIGHT ARM  Final   Special Requests   Final    BOTTLES DRAWN AEROBIC AND ANAEROBIC Blood Culture results may not be optimal due to an inadequate volume of blood received in culture bottles   Culture   Final    NO GROWTH 4 DAYS Performed at [facility], [address] [place], [place] [address]    Report Status PENDING  Incomplete  Culture, blood (Routine X 2) w Reflex to ID Panel     Status: None (Preliminary result)    Collection Time: 05/23/23 10:53 AM   Specimen: BLOOD RIGHT HAND  Result Value Ref Range Status   Specimen Description BLOOD RIGHT HAND  Final   Special Requests   Final    BOTTLES DRAWN AEROBIC AND ANAEROBIC Blood Culture results may not be optimal due to an inadequate volume of blood received in culture bottles   Culture   Final    NO GROWTH 3 DAYS Performed at [facility], [address], [place], [place] [address]    Report Status  PENDING  Incomplete  Culture, blood (Routine X 2) w Reflex to ID Panel     Status: None (Preliminary result)   Collection Time: 05/23/23 10:54 AM   Specimen: BLOOD RIGHT HAND  Result Value Ref Range Status   Specimen Description BLOOD RIGHT HAND  Final   Special Requests   Final    BOTTLES DRAWN AEROBIC AND ANAEROBIC Blood Culture results may not be optimal due to an inadequate volume of blood received in culture bottles   Culture   Final    NO GROWTH 3 DAYS Performed at [facility], [address], [place], [place] [address]    Report Status PENDING  Incomplete     Time coordinating discharge:  I have spent 35 minutes face to face with the patient and on the ward discussing the patients care, assessment, plan and disposition with other care givers. >50% of the time was devoted counseling the patient about the risks and benefits of treatment/Discharge disposition and coordinating care.   SIGNED:   Miguel Rota, MD  [facility] 05/26/2023, 12:37 PM   If 7PM-7AM, please contact night-coverage

## 2023-05-25 NOTE — Progress Notes (Signed)
 Physical Therapy Treatment Patient Details Name: Tina Frank MRN: [id number] DOB: [birth year] Today's Date: 05/25/2023   History of Present Illness 83 y.o. female sent from her facility and admitted for AMS. CT head, MRI brain are negative for any acute pathology but does show chronic changes.PMH: GERD, DM 2, diastolic CHF, paroxysmal atrial fibrillation on Xarelto, HLD, HTN, vertigo, osteoarthritis    PT Comments  Pt progressing well with mobility. Confusion appears to wax and wane. Min assist bed mobility, mod assist transfers, and min assist amb 25' with RW. Pt reports she does have a lift chair at [facility] due to chronic issues with sit to stand. Pt in recliner at end of session. Remains appropriate to return to ALF with HHPT, if facility able to provide needed level of assist.  05/26/23 addendum: CM contacted ALF. They are unable to provide current level of assist. Updated d/c recommendation to SNF.     If plan is discharge home, recommend the following: A lot of help with walking and/or transfers;A lot of help with bathing/dressing/bathroom   Can travel by private vehicle        Equipment Recommendations  None recommended by PT    Recommendations for Other Services       Precautions / Restrictions Precautions Precautions: Fall Recall of Precautions/Restrictions: Impaired     Mobility  Bed Mobility Overal bed mobility: Needs Assistance Bed Mobility: Supine to Sit     Supine to sit: Min assist, HOB elevated, Used rails          Transfers Overall transfer level: Needs assistance Equipment used: Rolling walker (2 wheels) Transfers: Sit to/from Stand Sit to Stand: Mod assist           General transfer comment: cues for sequencing, increased time to power up and stabilize balance    Ambulation/Gait Ambulation/Gait assistance: Min assist Gait Distance (Feet): 25 Feet Assistive device: Rolling walker (2 wheels) Gait Pattern/deviations: Step-through  pattern, Trunk flexed, Decreased stride length Gait velocity: decreased Gait velocity interpretation: <1.31 ft/sec, indicative of household ambulator   General Gait Details: mildly unsteady gait with RW   Stairs             Wheelchair Mobility     Tilt Bed    Modified Rankin (Stroke Patients Only)       Balance Overall balance assessment: Needs assistance Sitting-balance support: No upper extremity supported, Feet supported Sitting balance-Leahy Scale: Fair   Postural control: Posterior lean Standing balance support: Bilateral upper extremity supported, During functional activity, Reliant on assistive device for balance Standing balance-Leahy Scale: Poor                              Communication Communication Communication: Impaired Factors Affecting Communication: Hearing impaired  Cognition Arousal: Alert Behavior During Therapy: WFL for tasks assessed/performed   PT - Cognitive impairments: No family/caregiver present to determine baseline                       PT - Cognition Comments: Mildly confused. Appears to wax and wane. Following commands: Impaired Following commands impaired: Follows one step commands inconsistently    Cueing Cueing Techniques: Verbal cues, Tactile cues  Exercises      General Comments        Pertinent Vitals/Pain Pain Assessment Pain Assessment: No/denies pain    Home Living  Prior Function            PT Goals (current goals can now be found in the care plan section) Acute Rehab PT Goals Patient Stated Goal: return to [facility] Progress towards PT goals: Progressing toward goals    Frequency    Min 2X/week      PT Plan      Co-evaluation              AM-PAC PT "6 Clicks" Mobility   Outcome Measure  Help needed turning from your back to your side while in a flat bed without using bedrails?: A Little Help needed moving from lying on  your back to sitting on the side of a flat bed without using bedrails?: A Lot Help needed moving to and from a bed to a chair (including a wheelchair)?: A Lot Help needed standing up from a chair using your arms (e.g., wheelchair or bedside chair)?: A Lot Help needed to walk in hospital room?: A Little Help needed climbing 3-5 steps with a railing? : Total 6 Click Score: 13    End of Session Equipment Utilized During Treatment: Gait belt Activity Tolerance: Patient tolerated treatment well Patient left: in chair;with call bell/phone within reach;with chair alarm set Nurse Communication: Mobility status PT Visit Diagnosis: Unsteadiness on feet (R26.81);Muscle weakness (generalized) (M62.81);Difficulty in walking, not elsewhere classified (R26.2)     Time: 9528-4132 PT Time Calculation (min) (ACUTE ONLY): 18 min  Charges:    $Gait Training: 8-22 mins PT General Charges $$ ACUTE PT VISIT: 1 Visit                     Ferd Glassing., PT  Office # [phone]    Tina Frank 05/25/2023, 11:44 AM

## 2023-05-25 NOTE — TOC Progression Note (Addendum)
 Transition of Care [facility]) - Progression Note    Patient Details  Name: Tina Frank MRN: [id number] Date of Birth: 1940-10-25  Transition of Care [facility]) CM/SW Contact  Janae Bridgeman, RN Phone Number: 05/25/2023, 12:09 PM  Clinical Narrative:    CM spoke with Dr. Nelson Chimes and patient is able to return to [facility] today once discharge order and summary are placed.  I called and spoke with the patient's son by phone and either son plans to provide transportation back to the ALF by car.  I called [facility] and left voicemail with Lajoyce Corners, MSW and Rosalita Chessman, MSW at the facility.  CM will continue to follow the patient for patient's return to the facility for care.  05/25/2022 1315 - I called and spoke with IVy, MSW at the [facility] and she states that patient is requiring too much ADL assistance to return to ALF.  MSW is requesting SNF placement  [facility] has one available Bed.  I called the patient's son and he is agreeable to SNF placement at [facility] and is aware that insurance authorization will be started/ FL2 will be updated and insurance authorization started by [place], MSW.        Expected Discharge Plan and Services                                               Social Determinants of Health (SDOH) Interventions SDOH Screenings   Food Insecurity: No Food Insecurity (05/22/2023)  Housing: Low Risk  (05/25/2023)  Transportation Needs: No Transportation Needs (05/22/2023)  Utilities: Not At Risk (05/22/2023)  Depression (PHQ2-9): Low Risk  (02/23/2023)  Social Connections: Unknown (05/22/2023)  Tobacco Use: Medium Risk (05/22/2023)    Readmission Risk Interventions    05/25/2023   12:08 PM 04/28/2022    3:15 PM  Readmission Risk Prevention Plan  Transportation Screening Complete Complete  PCP or Specialist Appt within 5-7 Days  Complete  PCP or Specialist Appt within 3-5 Days Complete   Home Care Screening  Complete  Medication  Review (RN CM)  Complete  HRI or Home Care Consult Complete   Social Work Consult for Recovery Care Planning/Counseling Complete   Palliative Care Screening Complete

## 2023-05-26 DIAGNOSIS — G9341 Metabolic encephalopathy: Secondary | ICD-10-CM | POA: Diagnosis not present

## 2023-05-26 DIAGNOSIS — N2889 Other specified disorders of kidney and ureter: Secondary | ICD-10-CM | POA: Diagnosis not present

## 2023-05-26 DIAGNOSIS — N179 Acute kidney failure, unspecified: Secondary | ICD-10-CM | POA: Diagnosis not present

## 2023-05-26 DIAGNOSIS — E876 Hypokalemia: Secondary | ICD-10-CM | POA: Diagnosis not present

## 2023-05-26 DIAGNOSIS — M109 Gout, unspecified: Secondary | ICD-10-CM | POA: Diagnosis not present

## 2023-05-26 DIAGNOSIS — Z7401 Bed confinement status: Secondary | ICD-10-CM | POA: Diagnosis not present

## 2023-05-26 DIAGNOSIS — E782 Mixed hyperlipidemia: Secondary | ICD-10-CM | POA: Diagnosis not present

## 2023-05-26 DIAGNOSIS — K2100 Gastro-esophageal reflux disease with esophagitis, without bleeding: Secondary | ICD-10-CM | POA: Diagnosis not present

## 2023-05-26 DIAGNOSIS — M1712 Unilateral primary osteoarthritis, left knee: Secondary | ICD-10-CM | POA: Diagnosis not present

## 2023-05-26 DIAGNOSIS — N390 Urinary tract infection, site not specified: Secondary | ICD-10-CM | POA: Diagnosis not present

## 2023-05-26 DIAGNOSIS — Z8673 Personal history of transient ischemic attack (TIA), and cerebral infarction without residual deficits: Secondary | ICD-10-CM | POA: Diagnosis not present

## 2023-05-26 DIAGNOSIS — I10 Essential (primary) hypertension: Secondary | ICD-10-CM | POA: Diagnosis not present

## 2023-05-26 DIAGNOSIS — I4891 Unspecified atrial fibrillation: Secondary | ICD-10-CM | POA: Diagnosis not present

## 2023-05-26 DIAGNOSIS — G9340 Encephalopathy, unspecified: Secondary | ICD-10-CM | POA: Diagnosis not present

## 2023-05-26 DIAGNOSIS — N3000 Acute cystitis without hematuria: Secondary | ICD-10-CM | POA: Diagnosis not present

## 2023-05-26 DIAGNOSIS — I959 Hypotension, unspecified: Secondary | ICD-10-CM | POA: Diagnosis not present

## 2023-05-26 DIAGNOSIS — R7303 Prediabetes: Secondary | ICD-10-CM | POA: Diagnosis not present

## 2023-05-26 DIAGNOSIS — R410 Disorientation, unspecified: Secondary | ICD-10-CM | POA: Diagnosis not present

## 2023-05-26 DIAGNOSIS — E785 Hyperlipidemia, unspecified: Secondary | ICD-10-CM | POA: Diagnosis not present

## 2023-05-26 DIAGNOSIS — M6281 Muscle weakness (generalized): Secondary | ICD-10-CM | POA: Diagnosis not present

## 2023-05-26 DIAGNOSIS — D509 Iron deficiency anemia, unspecified: Secondary | ICD-10-CM | POA: Diagnosis not present

## 2023-05-26 DIAGNOSIS — R2681 Unsteadiness on feet: Secondary | ICD-10-CM | POA: Diagnosis not present

## 2023-05-26 DIAGNOSIS — K219 Gastro-esophageal reflux disease without esophagitis: Secondary | ICD-10-CM | POA: Diagnosis not present

## 2023-05-26 DIAGNOSIS — R2689 Other abnormalities of gait and mobility: Secondary | ICD-10-CM | POA: Diagnosis not present

## 2023-05-26 DIAGNOSIS — E871 Hypo-osmolality and hyponatremia: Secondary | ICD-10-CM | POA: Diagnosis not present

## 2023-05-26 LAB — CBC
HCT: 28.8 % — ABNORMAL LOW (ref 36.0–46.0)
Hemoglobin: 9.2 g/dL — ABNORMAL LOW (ref 12.0–15.0)
MCH: 27.1 pg (ref 26.0–34.0)
MCHC: 31.9 g/dL (ref 30.0–36.0)
MCV: 84.7 fL (ref 80.0–100.0)
Platelets: 181 10*3/uL (ref 150–400)
RBC: 3.4 MIL/uL — ABNORMAL LOW (ref 3.87–5.11)
RDW: 15.7 % — ABNORMAL HIGH (ref 11.5–15.5)
WBC: 6.8 10*3/uL (ref 4.0–10.5)
nRBC: 0 % (ref 0.0–0.2)

## 2023-05-26 LAB — BASIC METABOLIC PANEL
Anion gap: 7 (ref 5–15)
BUN: 10 mg/dL (ref 8–23)
CO2: 20 mmol/L — ABNORMAL LOW (ref 22–32)
Calcium: 9.3 mg/dL (ref 8.9–10.3)
Chloride: 106 mmol/L (ref 98–111)
Creatinine, Ser: 0.67 mg/dL (ref 0.44–1.00)
GFR, Estimated: >60 mL/min (ref >60)
Glucose, Bld: 93 mg/dL (ref 70–99)
Potassium: 3.3 mmol/L — ABNORMAL LOW (ref 3.5–5.1)
Sodium: 133 mmol/L — ABNORMAL LOW (ref 135–145)

## 2023-05-26 LAB — GLUCOSE, CAPILLARY
Glucose-Capillary: 97 mg/dL (ref 70–99)
Glucose-Capillary: 154 mg/dL — ABNORMAL HIGH (ref 70–99)

## 2023-05-26 LAB — MAGNESIUM: Magnesium: 1.7 mg/dL (ref 1.7–2.4)

## 2023-05-26 MED ORDER — POTASSIUM CHLORIDE CRYS ER 20 MEQ PO TBCR
40.0000 meq | EXTENDED_RELEASE_TABLET | Freq: Once | ORAL | Status: AC
  Administered 2023-05-26: 40 meq via ORAL
  Filled 2023-05-26: qty 2

## 2023-05-26 NOTE — TOC Transition Note (Signed)
 Transition of Care [facility]) - Discharge Note   Patient Details  Name: Tina Frank MRN: [id number] Date of Birth: October 07, 1940  Transition [facility] Care [facility]) CM/SW Contact:  Tina Drabik A [place], Tina Frank Phone Number: 05/26/2023, 3:07 PM   Clinical Narrative:     Patient will DC to: [facility]  Anticipated DC date: 05/26/23  Family notified: Tina Frank  Transport by: Tina Frank ID [id number] Reference ID [id number]  Approval Dates:  05/26/2023-05/28/2023    Per MD patient ready for DC to [facility]. RN, patient, patient's family, and facility notified of DC. Discharge Summary and FL2 sent to facility. RN to call report prior to discharge [phone] ext [phone], room 66A). DC packet on chart. Ambulance transport requested for patient.     CSW will sign off for now as social work intervention is no longer needed. Please consult [place] again if new needs arise.   Final next level of care: Skilled Nursing Facility Barriers to Discharge: Barriers Resolved   Patient Goals and CMS Choice            Discharge Placement              Patient chooses bed at: [facility] Patient to be transferred to facility by: [facility] Name of family member notified: Tina Frank Patient and family notified of of transfer: 05/26/23  Discharge Plan and Services Additional resources added to the After Visit Summary for                                       Social Drivers of Health (SDOH) Interventions SDOH Screenings   Food Insecurity: No Food Insecurity (05/22/2023)  Housing: Low Risk  (05/25/2023)  Transportation Needs: No Transportation Needs (05/22/2023)  Utilities: Not At Risk (05/22/2023)  Depression (PHQ2-9): Low Risk  (02/23/2023)  Social Connections: Unknown (05/22/2023)  Tobacco Use: Medium Risk (05/22/2023)     Readmission Risk Interventions    05/25/2023   12:08 PM 04/28/2022    3:15 PM  Readmission Risk Prevention Plan  Transportation Screening  Complete Complete  PCP or Specialist Appt within 5-7 Days  Complete  PCP or Specialist Appt within 3-5 Days Complete   Home Care Screening  Complete  Medication Review (RN CM)  Complete  HRI or Home Care Consult Complete   Social Work Consult for Recovery Care Planning/Counseling Complete   Palliative Care Screening Complete

## 2023-05-26 NOTE — Plan of Care (Signed)
  Problem: Coping: Goal: Ability to adjust to condition or change in health will improve Outcome: Progressing   Problem: Metabolic: Goal: Ability to maintain appropriate glucose levels will improve Outcome: Progressing   Problem: Skin Integrity: Goal: Risk for impaired skin integrity will decrease Outcome: Progressing   Problem: Tissue Perfusion: Goal: Adequacy of tissue perfusion will improve Outcome: Progressing   Problem: Health Behavior/Discharge Planning: Goal: Ability to manage health-related needs will improve Outcome: Progressing   Problem: Clinical Measurements: Goal: Ability to maintain clinical measurements within normal limits will improve Outcome: Progressing

## 2023-05-26 NOTE — Care Management Important Message (Signed)
 Important Message  Patient Details  Name: Tina Frank MRN: [id number] Date of Birth: 02/04/1941   Important Message Given:  Yes - Medicare IM     Dorena Bodo 05/26/2023, 1:47 PM

## 2023-05-26 NOTE — TOC Progression Note (Signed)
 Transition of Care [facility]) - Progression Note    Patient Details  Name: Tina Frank MRN: [id number] Date of Birth: 05/25/1940  Transition of Care [facility]) CM/SW Contact  Asees Manfredi A [place], LCSW Phone Number: 05/26/2023, 10:35 AM  Clinical Narrative:      CSW started pt's insurance authorization for [employer]. Auth ID: [id number]. Status pending.   [facility] once [facility] is approved.    TOC will continue to follow.       Expected Discharge Plan and Services         Expected Discharge Date: 05/25/23                                     Social Determinants of Health (SDOH) Interventions SDOH Screenings   Food Insecurity: No Food Insecurity (05/22/2023)  Housing: Low Risk  (05/25/2023)  Transportation Needs: No Transportation Needs (05/22/2023)  Utilities: Not At Risk (05/22/2023)  Depression (PHQ2-9): Low Risk  (02/23/2023)  Social Connections: Unknown (05/22/2023)  Tobacco Use: Medium Risk (05/22/2023)    Readmission Risk Interventions    05/25/2023   12:08 PM 04/28/2022    3:15 PM  Readmission Risk Prevention Plan  Transportation Screening Complete Complete  PCP or Specialist Appt within 5-7 Days  Complete  PCP or Specialist Appt within 3-5 Days Complete   Home Care Screening  Complete  Medication Review (RN CM)  Complete  HRI or Home Care Consult Complete   Social Work Consult for Recovery Care Planning/Counseling Complete   Palliative Care Screening Complete

## 2023-05-26 NOTE — Progress Notes (Signed)
 PROGRESS NOTE    Tina Frank  WUJ:[id number] DOB: 1941/01/14 DOA: 05/22/2023 PCP: Sigmund Hazel, MD    Brief Narrative:   83 y.o. female with medical history significant of GERD, DM 2, diastolic CHF, paroxysmal atrial fibrillation on Xarelto, HLD, HTN, vertigo, osteoarthritis sent to the hospital from her facility for evaluation of mental status change.  History is limited as patient is very poor historian.  Thus far CT head, MRI brain are negative for any acute pathology but does show chronic changes.  Metabolic workup is also negative.  UA shows bacteriuria but no signs of active infection.  But given prior presentation, started on IV Rocephin.  PT/OT recommended HH/SNF.  TOC consulted. Patient completed 3 days of Rocephin in the hospital and doing much better feeling back to baseline.   Patient is currently awaiting placement at SNF  Assessment & Plan:  Principal Problem:   AMS (altered mental status) Active Problems:   History of stroke   Essential hypertension   DM (diabetes mellitus) (HCC)   Hyperlipidemia   Atrial fibrillation (HCC)    Acute metabolic encephalopathy, improved Urinary tract infection - Unclear etiology.  Thus far most of the workup is negative including CT head, MRI brain.  TSH, vitamin D, B12 are all normal. -After prolonged discussion with son, I offered to treat her with 3 days of Rocephin which significantly improved her condition. -Similar presentation about 6 months ago when her MRI and EEG did not show any significant findings. - LFTs and lipase are normal  Bacteriuria Gram-positive bacteremia - GPC likely contaminant.  Repeat cultures remain negative. - Completed 3 days of Rocephin on 3/17.   AKI on CKD stage II, resolved - Baseline creatinine 0.6, admission creatinine 1.05.  Resolved and back to baseline   Atrial fibrillation, paroxysmal - Continue home Xarelto.  Echocardiogram August 2024 showed preserved EF.   Essential  hypertension -Continue home hydralazine.  IV as needed   GERD - PPI   Diabetes mellitus type 2 - Sliding scale and Accu-Chek   Anemia of chronic disease Hemoglobin around baseline of 10   History of vertigo - On outpatient meclizine    DVT prophylaxis: Xarelto    Code Status: Limited: Do not attempt resuscitation (DNR) -DNR-LIMITED -Do Not Intubate/DNI  Family Communication: Son is at bedside Discharge, pending SNF placement  Subjective: Seen at bedside, no complaints.  Awaiting SNF placement  Examination:  General exam: Appears calm and comfortable  Respiratory system: Clear to auscultation. Respiratory effort normal. Cardiovascular system: S1 & S2 heard, RRR. No JVD, murmurs, rubs, gallops or clicks. No pedal edema. Gastrointestinal system: Abdomen is nondistended, soft and nontender. No organomegaly or masses felt. Normal bowel sounds heard. Central nervous system: Alert and oriented to name and place. No focal neurological deficits. Words at times Extremities: Symmetric 5 x 5 power. Skin: No rashes, lesions or ulcers Psychiatry: Judgement and insight appear poor                Diet Orders (From admission, onward)     Start     Ordered   05/22/23 1428  Diet regular Room service appropriate? Yes; Fluid consistency: Thin  Diet effective now       Question Answer Comment  Room service appropriate? Yes   Fluid consistency: Thin      05/22/23 1428            Objective: Vitals:   05/25/23 2157 05/26/23 0419 05/26/23 0441 05/26/23 0738  BP: 120/85 (!) 138/49 Marland Kitchen)  118/45 132/74  Pulse:  (!) 118 (!) 55 (!) 57  Resp:  18 18 16   Temp:  97.8 F (36.6 C)  97.8 F (36.6 C)  TempSrc:  Oral  Oral  SpO2:  98% 96% 94%  Weight:  51.3 kg    Height:       No intake or output data in the 24 hours ending 05/26/23 1237 Filed Weights   05/22/23 1149 05/23/23 0546 05/26/23 0419  Weight: 55 kg 54.9 kg 51.3 kg    Scheduled Meds:  allopurinol  300 mg Oral  Daily   atorvastatin  10 mg Oral QHS   calcium carbonate  1 tablet Oral TID WC   cyanocobalamin  1,000 mcg Oral Daily   docusate sodium  100 mg Oral BID   famotidine  20 mg Oral BID   ferrous sulfate  330 mg Oral Once per day on Monday Wednesday Friday   hydrALAZINE  100 mg Oral TID   insulin aspart  0-6 Units Subcutaneous TID WC   pantoprazole  40 mg Oral BID   Rivaroxaban  15 mg Oral Q supper   tamsulosin  0.4 mg Oral Daily   Continuous Infusions:  Nutritional status     Body mass index is 18.82 kg/m.  Data Reviewed:   CBC: Recent Labs  Lab 05/22/23 1116 05/23/23 0622 05/24/23 0701 05/25/23 0603 05/26/23 0540  WBC 8.8 6.5 7.6 6.7 6.8  NEUTROABS 6.3  --   --   --   --   HGB 10.7* 11.0* 10.7* 9.6* 9.2*  HCT 34.2* 33.9* 33.2* 29.4* 28.8*  MCV 86.8 83.9 84.3 83.1 84.7  PLT 211 201 206 183 181   Basic Metabolic Panel: Recent Labs  Lab 05/22/23 1116 05/23/23 0622 05/24/23 0701 05/25/23 0603 05/26/23 0540  NA 135 133* 133* 132* 133*  K 4.1 3.6 3.7 3.5 3.3*  CL 105 105 103 104 106  CO2 22 22 21* 22 20*  GLUCOSE 97 81 96 104* 93  BUN 12 10 12 10 10   CREATININE 1.05* 0.89 0.80 0.79 0.67  CALCIUM 10.3 9.8 9.7 9.2 9.3  MG  --  1.5* 1.4* 2.1 1.7   GFR: Estimated Creatinine Clearance: 42.4 mL/min (by C-G formula based on SCr of 0.67 mg/dL). Liver Function Tests: Recent Labs  Lab 05/22/23 1116  AST 16  ALT 7  ALKPHOS 77  BILITOT 0.8  PROT 6.1*  ALBUMIN 2.8*   Recent Labs  Lab 05/22/23 1450  LIPASE 28   Recent Labs  Lab 05/22/23 1450  AMMONIA 13   Coagulation Profile: Recent Labs  Lab 05/22/23 1116  INR 2.0*   Cardiac Enzymes: No results for input(s): "CKTOTAL", "CKMB", "CKMBINDEX", "TROPONINI" in the last 168 hours. BNP (last 3 results) No results for input(s): "PROBNP" in the last 8760 hours. HbA1C: No results for input(s): "HGBA1C" in the last 72 hours. CBG: Recent Labs  Lab 05/25/23 1206 05/25/23 1549 05/25/23 1958 05/26/23 0736  05/26/23 1150  GLUCAP 179* 116* 119* 97 154*   Lipid Profile: No results for input(s): "CHOL", "HDL", "LDLCALC", "TRIG", "CHOLHDL", "LDLDIRECT" in the last 72 hours. Thyroid Function Tests: No results for input(s): "TSH", "T4TOTAL", "FREET4", "T3FREE", "THYROIDAB" in the last 72 hours. Anemia Panel: No results for input(s): "VITAMINB12", "FOLATE", "FERRITIN", "TIBC", "IRON", "RETICCTPCT" in the last 72 hours. Sepsis Labs: Recent Labs  Lab 05/22/23 1127  LATICACIDVEN 1.2    Recent Results (from the past 240 hours)  Blood Culture (routine x 2)     Status:  Abnormal   Collection Time: 05/22/23 11:16 AM   Specimen: BLOOD LEFT ARM  Result Value Ref Range Status   Specimen Description BLOOD LEFT ARM  Final   Special Requests   Final    BOTTLES DRAWN AEROBIC AND ANAEROBIC Blood Culture adequate volume   Culture  Setup Time   Final    GRAM POSITIVE COCCI IN BOTH AEROBIC AND ANAEROBIC BOTTLES Organism ID to follow CRITICAL RESULT CALLED TO, READ BACK BY AND VERIFIED WITH: V BRYK,PHARMD@0750  05/23/23 MK    Culture (A)  Final    STAPHYLOCOCCUS CAPITIS THE SIGNIFICANCE OF ISOLATING THIS ORGANISM FROM A SINGLE SET OF BLOOD CULTURES WHEN MULTIPLE SETS ARE DRAWN IS UNCERTAIN. PLEASE NOTIFY THE MICROBIOLOGY DEPARTMENT WITHIN ONE WEEK IF SPECIATION AND SENSITIVITIES ARE REQUIRED. Performed at [facility], [address] [place], [place] [address]    Report Status 05/25/2023 FINAL  Final  Resp panel by RT-PCR (RSV, Flu A&B, Covid) Anterior Nasal Swab     Status: None   Collection Time: 05/22/23 11:16 AM   Specimen: Anterior Nasal Swab  Result Value Ref Range Status   SARS Coronavirus 2 by RT PCR NEGATIVE NEGATIVE Final   Influenza A by PCR NEGATIVE NEGATIVE Final   Influenza B by PCR NEGATIVE NEGATIVE Final    Comment: (NOTE) The Xpert Xpress SARS-CoV-2/FLU/RSV plus assay is intended as an aid in the diagnosis of influenza from Nasopharyngeal swab specimens and should not be used  as a sole basis for treatment. Nasal washings and aspirates are unacceptable for Xpert Xpress SARS-CoV-2/FLU/RSV testing.  Fact Sheet for Patients: BloggerCourse.com  Fact Sheet for Healthcare Providers: SeriousBroker.it  This test is not yet approved or cleared by the [place] FDA and has been authorized for detection and/or diagnosis of SARS-CoV-2 by FDA under an Emergency Use Authorization (EUA). This EUA will remain in effect (meaning this test can be used) for the duration of the COVID-19 declaration under Section 564(b)(1) of the Act, 21 U.S.C. section 360bbb-3(b)(1), unless the authorization is terminated or revoked.     Resp Syncytial Virus by PCR NEGATIVE NEGATIVE Final    Comment: (NOTE) Fact Sheet for Patients: BloggerCourse.com  Fact Sheet for Healthcare Providers: SeriousBroker.it  This test is not yet approved or cleared by the [place] FDA and has been authorized for detection and/or diagnosis of SARS-CoV-2 by FDA under an Emergency Use Authorization (EUA). This EUA will remain in effect (meaning this test can be used) for the duration of the COVID-19 declaration under Section 564(b)(1) of the Act, 21 U.S.C. section 360bbb-3(b)(1), unless the authorization is terminated or revoked.  Performed at [facility], [address], [place], [place] [address]   Blood Culture ID Panel (Reflexed)     Status: Abnormal   Collection Time: 05/22/23 11:16 AM  Result Value Ref Range Status   Enterococcus faecalis NOT DETECTED NOT DETECTED Final   Enterococcus Faecium NOT DETECTED NOT DETECTED Final   Listeria monocytogenes NOT DETECTED NOT DETECTED Final   Staphylococcus species DETECTED (A) NOT DETECTED Final    Comment: CRITICAL RESULT CALLED TO, READ BACK BY AND VERIFIED WITH: V BRYK,PHARMD@03 /15/25 MK    Staphylococcus aureus (BCID) NOT DETECTED NOT  DETECTED Final   Staphylococcus epidermidis NOT DETECTED NOT DETECTED Final   Staphylococcus lugdunensis NOT DETECTED NOT DETECTED Final   Streptococcus species NOT DETECTED NOT DETECTED Final   Streptococcus agalactiae NOT DETECTED NOT DETECTED Final   Streptococcus pneumoniae NOT DETECTED NOT DETECTED Final   Streptococcus  pyogenes NOT DETECTED NOT DETECTED Final   A.calcoaceticus-baumannii NOT DETECTED NOT DETECTED Final   Bacteroides fragilis NOT DETECTED NOT DETECTED Final   Enterobacterales NOT DETECTED NOT DETECTED Final   Enterobacter cloacae complex NOT DETECTED NOT DETECTED Final   Escherichia coli NOT DETECTED NOT DETECTED Final   Klebsiella aerogenes NOT DETECTED NOT DETECTED Final   Klebsiella oxytoca NOT DETECTED NOT DETECTED Final   Klebsiella pneumoniae NOT DETECTED NOT DETECTED Final   Proteus species NOT DETECTED NOT DETECTED Final   Salmonella species NOT DETECTED NOT DETECTED Final   Serratia marcescens NOT DETECTED NOT DETECTED Final   Haemophilus influenzae NOT DETECTED NOT DETECTED Final   Neisseria meningitidis NOT DETECTED NOT DETECTED Final   Pseudomonas aeruginosa NOT DETECTED NOT DETECTED Final   Stenotrophomonas maltophilia NOT DETECTED NOT DETECTED Final   Candida albicans NOT DETECTED NOT DETECTED Final   Candida auris NOT DETECTED NOT DETECTED Final   Candida glabrata NOT DETECTED NOT DETECTED Final   Candida krusei NOT DETECTED NOT DETECTED Final   Candida parapsilosis NOT DETECTED NOT DETECTED Final   Candida tropicalis NOT DETECTED NOT DETECTED Final   Cryptococcus neoformans/gattii NOT DETECTED NOT DETECTED Final    Comment: Performed at [facility], [address] [place], [place] [address]  Blood Culture (routine x 2)     Status: None (Preliminary result)   Collection Time: 05/22/23 11:21 AM   Specimen: BLOOD RIGHT ARM  Result Value Ref Range Status   Specimen Description BLOOD RIGHT ARM  Final   Special Requests   Final    BOTTLES  DRAWN AEROBIC AND ANAEROBIC Blood Culture results may not be optimal due to an inadequate volume of blood received in culture bottles   Culture   Final    NO GROWTH 4 DAYS Performed at [facility], [address] [place], [place] [address]    Report Status PENDING  Incomplete  Culture, blood (Routine X 2) w Reflex to ID Panel     Status: None (Preliminary result)   Collection Time: 05/23/23 10:53 AM   Specimen: BLOOD RIGHT HAND  Result Value Ref Range Status   Specimen Description BLOOD RIGHT HAND  Final   Special Requests   Final    BOTTLES DRAWN AEROBIC AND ANAEROBIC Blood Culture results may not be optimal due to an inadequate volume of blood received in culture bottles   Culture   Final    NO GROWTH 3 DAYS Performed at [facility], [address], [place], [place] [address]    Report Status PENDING  Incomplete  Culture, blood (Routine X 2) w Reflex to ID Panel     Status: None (Preliminary result)   Collection Time: 05/23/23 10:54 AM   Specimen: BLOOD RIGHT HAND  Result Value Ref Range Status   Specimen Description BLOOD RIGHT HAND  Final   Special Requests   Final    BOTTLES DRAWN AEROBIC AND ANAEROBIC Blood Culture results may not be optimal due to an inadequate volume of blood received in culture bottles   Culture   Final    NO GROWTH 3 DAYS Performed at [facility], [address], [place], [place] [address]    Report Status PENDING  Incomplete         Radiology Studies: No results found.         LOS: 3 days   Time spent= 35 mins    Miguel Rota, MD [facility]  If 7PM-7AM,  please contact night-coverage  05/26/2023, 12:37 PM

## 2023-05-27 ENCOUNTER — Encounter: Payer: Self-pay | Admitting: Nurse Practitioner

## 2023-05-27 ENCOUNTER — Non-Acute Institutional Stay (SKILLED_NURSING_FACILITY): Payer: Self-pay | Admitting: Nurse Practitioner

## 2023-05-27 DIAGNOSIS — D509 Iron deficiency anemia, unspecified: Secondary | ICD-10-CM | POA: Diagnosis not present

## 2023-05-27 DIAGNOSIS — M109 Gout, unspecified: Secondary | ICD-10-CM

## 2023-05-27 DIAGNOSIS — G9340 Encephalopathy, unspecified: Secondary | ICD-10-CM

## 2023-05-27 DIAGNOSIS — I10 Essential (primary) hypertension: Secondary | ICD-10-CM

## 2023-05-27 DIAGNOSIS — M1712 Unilateral primary osteoarthritis, left knee: Secondary | ICD-10-CM

## 2023-05-27 DIAGNOSIS — K2100 Gastro-esophageal reflux disease with esophagitis, without bleeding: Secondary | ICD-10-CM

## 2023-05-27 DIAGNOSIS — Z8673 Personal history of transient ischemic attack (TIA), and cerebral infarction without residual deficits: Secondary | ICD-10-CM

## 2023-05-27 DIAGNOSIS — E876 Hypokalemia: Secondary | ICD-10-CM | POA: Diagnosis not present

## 2023-05-27 DIAGNOSIS — E871 Hypo-osmolality and hyponatremia: Secondary | ICD-10-CM | POA: Diagnosis not present

## 2023-05-27 DIAGNOSIS — I4891 Unspecified atrial fibrillation: Secondary | ICD-10-CM | POA: Diagnosis not present

## 2023-05-27 DIAGNOSIS — Z87898 Personal history of other specified conditions: Secondary | ICD-10-CM

## 2023-05-27 DIAGNOSIS — E785 Hyperlipidemia, unspecified: Secondary | ICD-10-CM

## 2023-05-27 DIAGNOSIS — R7303 Prediabetes: Secondary | ICD-10-CM

## 2023-05-27 LAB — CULTURE, BLOOD (ROUTINE X 2): Culture: NO GROWTH

## 2023-05-27 NOTE — Assessment & Plan Note (Signed)
 Blood pressure is controlled, on Hydralazine,  prn Clonidine

## 2023-05-27 NOTE — Assessment & Plan Note (Signed)
 Pantoprazole, Famotidine, Sucralfate. Followed by GI, MRI and barium swallow study.

## 2023-05-27 NOTE — Assessment & Plan Note (Signed)
 Hospitalized 05/22/23-05/26/23 for AMS, f/u CBC/BMP. Negative CT head/MRI brain and metabolic workup and UA, treated with Rocephin x3 days anyway. Unclear etiology.      05/22/23 the patient was found in recliner in near fetus position, repetitively saying put my legs down, but yelling out when assistance attempted. The patient stated she cannot open her eyes when asked to. The patient is oriented to person, place. No noted focal weakness. She is afebrile, no O2 desaturation.   Baseline mentation today.

## 2023-05-27 NOTE — Assessment & Plan Note (Signed)
 the patient desires to resume Atorvastatin and denied any allergic reaction, LDL 62 10/13/22, LDL 69 04/02/23, f/u lipid panel 6 months.

## 2023-05-27 NOTE — Assessment & Plan Note (Signed)
 stable, takes Allopurinol.

## 2023-05-27 NOTE — Assessment & Plan Note (Signed)
 R+L knee pain, f/u Ortho for Gel inj, ambulates with walker.

## 2023-05-27 NOTE — Assessment & Plan Note (Signed)
 Heart rate is in control, resumed Xarelto, followed by Cardiology

## 2023-05-27 NOTE — Assessment & Plan Note (Signed)
 added Vit B12(Vit B12 273 12/10/22) and Fe 01/15/23. 01/30/23 Cardiology dc'd ASA, Hgb 9.2 05/26/23, update CBC/diff 06/02/23

## 2023-05-27 NOTE — Assessment & Plan Note (Signed)
 resolved difficulty word finding, off Statin, ASA, f/u stroke clinic, MRI small punctate infarct in the posteerior limb of R internal capsule, no focal weakness

## 2023-05-27 NOTE — Assessment & Plan Note (Signed)
 Hx of urinary retention, taking Tamsulosin. F/u urology

## 2023-05-27 NOTE — Assessment & Plan Note (Signed)
 Prediabetes Hgb A1c 6.0 10/13/22, TSH 1.293 10/12/22, ACR 242(<30)02/26/23

## 2023-05-27 NOTE — Assessment & Plan Note (Signed)
 Na 133 05/26/23, repeat BMP 06/02/23

## 2023-05-27 NOTE — Assessment & Plan Note (Signed)
 serum K 2.7 04/17/23<<3.5 05/14/23>>3.3 05/26/23, on  Kcl supplement, repeat BMP 06/02/23

## 2023-05-27 NOTE — Progress Notes (Signed)
 Location:   S[facility] Room Number: 66A Place of Service:  SNF (31) Provider: Arna Snipe Rayna Brenner NP  Sigmund Hazel, MD  Patient Care Team: Sigmund Hazel, MD as PCP - General (Family Medicine) Tessa Lerner, DO as PCP - Cardiology (Cardiology) Suzi Roots as Physician Assistant (Dermatology)  Extended Emergency Contact Information Primary Emergency Contact: [name], [place] [address] [place] of [place] Home Phone: [phone] Work Phone: [phone] Mobile Phone: [phone] Relation: Son Secondary Emergency Contact: Yankee,[name]  [place] Phone: [phone] Relation: Son  Code Status: DNR Goals of care: Advanced Directive information    05/22/2023   11:08 AM  Advanced Directives  Does Patient Have a Medical Advance Directive? No  Would patient like information on creating a medical advance directive? No - Guardian declined     Chief Complaint  Patient presents with  . Acute Visit    Medication review following hospital stay.     HPI:  Pt is a 83 y.o. female seen today for an acute visit for medication review following hospital stay.   Hospitalized 05/22/23-05/26/23 for AMS, f/u CBC/BMP. Negative CT head/MRI brain and metabolic workup and UA, treated with Rocephin x3 days anyway. Unclear etiology.      05/22/23 the patient was found in recliner in near fetus position, repetitively saying put my legs down, but yelling out when assistance attempted. The patient stated she cannot open her eyes when asked to. The patient is oriented to person, place. No noted focal weakness. She is afebrile, no O2 desaturation.                            C/o burning sensation on urination, increased frequency and incontinence. Denied lower abd/back pain/pressure, she is afebrile.              05/14/23 negative UA 05/18/23 negative UA 05/19/23 treated with Pyridium              Hypokalemia, serum K 2.7 04/17/23<<3.5 05/14/23>>3.3  05/26/23, on  Kcl supplement  Hyponatremia, Na 133 05/26/23             GERD, on  Voquezna per GI 03/27/23  03/27/23 GI dc'd Pantoprazole, Famotidine, Carafage 04/01/23 f/u GI, dc'd Voquenza, restarted Pantoprazole, Famotidine, Sucralfate. Followed by GI, MRI and barium swallow study.               Anemia, added Vit B12(Vit B12 273 12/10/22) and Fe 01/15/23. 01/30/23 Cardiology dc'd ASA, Hgb 9.2 05/26/23             New onset Atrial Fibrillation -2D echo from 10/13/2022 with a EF of 60 to 65%, grade 1 DD and mildly dilated left atrium. -Patient with history of bradycardia arrhythmia for which she was sent to cardiology in May 2024. -Patient felt not a great candidate for long-term anticoagulation but fall risk now is lower due to debility per son. -Patient placed on Xarelto which is preferred medication per family. -Patient has been placed on full dose Lovenox in anticipation of bone marrow biopsy which was done 01/06/2023.   -Resuming Rivaroxaban 01/07/2023. 03/11/22 cardiology H&H, BMP q 6months  TSH 1.438 05/22/23              Saw oncology 01/19/23 MGUS, negative cone marrow biopsy, moderate risk of progression to IgM myeloma or Waldenstrom macroglobulinemia(19-37% in next 20 years), f/u.  Hydronephrosis: f/u urology for renal mass left 1.9x1.6cm             Hospitalized 12/22/22 for metabolic encephalopathy 2/2 to UTI, AKI, and hypercalcemia.              Dysphagia, f/u ST             Hospitalized 10/12/22-10/17/22 UTI, encephalopathy, acute CVA             OA R+L knee pain, f/u Ortho for Gel inj, ambulates with walker.              CVA resolved difficulty word finding, off Statin, ASA, f/u stroke clinic, MRI small punctate infarct in the posteerior limb of R internal capsule, no focal weakness             Gout, stable, takes Allopurinol             HLD, the patient desires to resume Atorvastatin and denied any allergic reaction, LDL 62 10/13/22, LDL 69 04/02/23, f/u lipid panel 6 months.               HTN, on Hydralazine,  prn Clonidine             CXR 10/14/22 pulm edema/volume overload, not apparent, off prn Furosemide, BNP 950.8 10/14/22, Echo 60-65% EF 10/13/22             Constipation, stable, diet controlled             Prediabetes Hgb A1c 6.0 10/13/22, TSH 1.293 10/12/22, ACR 242(<30)02/26/23             Positive anti CCP test, f/u Rheumatology              Bradycardia, Hx of, off Carvedilol              Hx of urinary retention, taking Tamsulosin.    Past Medical History:  Diagnosis Date  . CKD (chronic kidney disease), stage II    nephrologist-- dr Kathrene Bongo  [facility])  . Diabetes mellitus type 2, diet-controlled (HCC)    followed by pcp---   (09-20-2019  per pt does not check blood sugar at home)  . Fracture of humeral shaft, right, closed 04/25/2022  . GERD (gastroesophageal reflux disease)   . History of primary hyperparathyroidism    s/p  parathyroidectomy right inferior on 12-02-2012  . Humerus fracture 04/25/2022  . Hyperlipidemia   . Hypertension    followd by nephrologist----  (09-20-2019  per pt had stress test done some time ago , unsure when/ where, thinks told ok)  . OA (osteoarthritis)   . Osteoporosis   . Prosthetic joint infection (HCC)    followed by dr j. hatcher (ID)   left total shoulder arthroplasty 12/ 2016  ; 12/ 2020  revision with explant joint replacement and hemiarthroplasty;  completed 6 month antibiotic 05/ 2021  . Vertigo    Past Surgical History:  Procedure Laterality Date  . COLONOSCOPY    . DILATATION & CURETTAGE/HYSTEROSCOPY WITH MYOSURE N/A 09/28/2019   Procedure: DILATATION & CURETTAGE/HYSTEROSCOPY WITH MYOSURE;  Surgeon: Olivia Mackie, MD;  Location: [facility];  Service: Gynecology;  Laterality: N/A;  . EYE SURGERY  yrs ago   laser surgery for retinal tear.  (unilateral , pt unsure which side)  . ORIF HUMERUS FRACTURE Right 04/27/2022   Procedure: OPEN REDUCTION INTERNAL FIXATION (ORIF) PROXIMAL HUMERUS  FRACTURE;  Surgeon: Bjorn Pippin, MD;  Location: [facility];  Service: Orthopedics;  Laterality: Right;  . PARATHYROIDECTOMY N/A 12/02/2012   Procedure: PARATHYROIDECTOMY with frozen section ;  Surgeon: Velora Heckler, MD;  Location: [facility];  Service: General;  Laterality: N/A;  . REVERSE SHOULDER ARTHROPLASTY Right 04/27/2022   Procedure: REVERSE SHOULDER ARTHROPLASTY;  Surgeon: Bjorn Pippin, MD;  Location: [facility];  Service: Orthopedics;  Laterality: Right;  . SHOULDER INJECTION Left 07/27/2013   Procedure: SHOULDER INJECTION;  Surgeon: Loreta Ave, MD;  Location: [facility] OR;  Service: Orthopedics;  Laterality: Left;  . STERIOD INJECTION Right 02/14/2015   Procedure: RIGHT SHOULDER CORTISONE INJECTION;  Surgeon: Loreta Ave, MD;  Location: [facility] OR;  Service: Orthopedics;  Laterality: Right;  . TOTAL HIP ARTHROPLASTY Left 07/27/2013   Procedure: TOTAL HIP ARTHROPLASTY ANTERIOR APPROACH;  Surgeon: Loreta Ave, MD;  Location[facility];  Service: Orthopedics;  Laterality: Left;  . TOTAL SHOULDER ARTHROPLASTY Left 02/14/2015   Procedure: LEFT TOTAL SHOULDER ARTHROPLASTY;  Surgeon: Loreta Ave, MD;  Location: [facility] OR;  Service: Orthopedics;  Laterality: Left;  . TOTAL SHOULDER REVISION Left 02/09/2019   Procedure: TOTAL SHOULDER REVISION;  Surgeon: Bjorn Pippin, MD;  Location: [facility];  Service: Orthopedics;  Laterality: Left;    Allergies  Allergen Reactions  . Abaloparatide Other (See Comments)    "Burred vision, lightheaded" (patient does not recall this in 2024)  . Mobic [Meloxicam] Hypertension  . Crestor [Rosuvastatin] Other (See Comments)    Muscle aches  . Norvasc [Amlodipine Besylate] Swelling and Other (See Comments)    Ankle swelling  . Simvastatin Other (See Comments)    Muscle aches, high LFTs  . Sulfa Antibiotics Rash  . Zetia [Ezetimibe] Other (See Comments)    Muscle aches    Allergies as of 05/27/2023       Reactions   Abaloparatide Other (See Comments)   "Burred vision,  lightheaded" (patient does not recall this in 2024)   Mobic [meloxicam] Hypertension   Crestor [rosuvastatin] Other (See Comments)   Muscle aches   Norvasc [amlodipine Besylate] Swelling, Other (See Comments)   Ankle swelling   Simvastatin Other (See Comments)   Muscle aches, high LFTs   Sulfa Antibiotics Rash   Zetia [ezetimibe] Other (See Comments)   Muscle aches        Medication List        Accurate as of May 27, 2023  3:44 PM. If you have any questions, ask your nurse or doctor.          acetaminophen 325 MG tablet Commonly known as: TYLENOL Take 650 mg by mouth every 4 (four) hours as needed for moderate pain (pain score 4-6).   allopurinol 300 MG tablet Commonly known as: ZYLOPRIM Take 300 mg by mouth daily.   atorvastatin 10 MG tablet Commonly known as: LIPITOR Take 10 mg by mouth daily.   cloNIDine 0.1 MG tablet Commonly known as: CATAPRES Take 0.1 mg by mouth daily as needed (for hypertension- if systolic b/p is over 160).   cyanocobalamin 1000 MCG tablet Take 1,000 mcg by mouth daily.   diclofenac Sodium 1 % Gel Commonly known as: VOLTAREN Apply 2-4 g topically 4 (four) times daily.   docusate sodium 100 MG capsule Commonly known as: COLACE Take 100 mg by mouth 2 (two) times daily.   famotidine 20 MG tablet Commonly known as: PEPCID Take 1 tablet (20 mg total) by mouth 2 (two) times daily.   ferrous sulfate 220 (44 Fe) MG/5ML solution Take 7.5 mLs by mouth 3 (three)  times a week. Monday, Wednesday, Friday   hydrALAZINE 100 MG tablet Commonly known as: APRESOLINE Take 1 tablet (100 mg total) by mouth 3 (three) times daily.   meclizine 12.5 MG tablet Commonly known as: ANTIVERT Take 1 tablet (12.5 mg total) by mouth 3 (three) times daily as needed for dizziness.   pantoprazole 40 MG tablet Commonly known as: PROTONIX Take 1 tablet (40 mg total) by mouth 2 (two) times daily.   Rivaroxaban 15 MG Tabs tablet Commonly known as:  XARELTO Take 1 tablet (15 mg total) by mouth daily with supper.   senna-docusate 8.6-50 MG tablet Commonly known as: Senokot-S Take 1 tablet by mouth 2 (two) times daily.   sucralfate 1 GM/10ML suspension Commonly known as: Carafate Take 10 mLs (1 g total) by mouth 4 (four) times daily -  with meals and at bedtime. Give  1 hour before meals and at bedtime on an empt stomach   tamsulosin 0.4 MG Caps capsule Commonly known as: FLOMAX Take 1 capsule (0.4 mg total) by mouth daily.   Zinc Oxide 10 % Oint Apply 1 Application topically See admin instructions. 1 application to buttocks every shift for redness        Review of Systems  Constitutional:  Positive for fatigue. Negative for appetite change and fever.  HENT:  Negative for congestion, mouth sores and trouble swallowing.   Eyes:  Negative for visual disturbance.  Respiratory:  Negative for cough and shortness of breath.   Cardiovascular:  Negative for leg swelling.  Gastrointestinal:  Negative for abdominal pain, constipation, nausea and vomiting.       GERD symptoms intermittently  Genitourinary:  Positive for frequency. Negative for difficulty urinating, dysuria and urgency.       Nocturnal urination 2x average.  Incontinent of urine occasionally   Musculoskeletal:  Positive for arthralgias and gait problem.       R+L knee pain, R+L shoulder limited overhead ROM, c/o pain in the R shoulder, mild.   Skin:  Negative for color change.  Neurological:  Negative for speech difficulty, weakness and headaches.  Psychiatric/Behavioral:  Positive for sleep disturbance. Negative for confusion. The patient is not nervous/anxious.        Sometimes not sleeping, declined sleeping aid.     Immunization History  Administered Date(s) Administered  . Influenza Split 11/24/2008  . Influenza, High Dose Seasonal PF 03/16/2014, 01/14/2016, 01/21/2017, 02/24/2022  . Influenza,inj,Quad PF,6+ Mos 11/20/2010, 02/01/2013  .  Influenza-Unspecified 11/20/2010, 02/01/2013, 02/19/2018, 02/24/2022  . Moderna Sars-Covid-2 Vaccination 04/23/2019, 05/21/2019, 02/20/2020  . Pneumococcal Conjugate-13 03/16/2014  . Pneumococcal Polysaccharide-23 04/28/2005  . Td 04/28/2005  . Td (Adult) 04/28/2005  . Tdap 06/08/2017  . Zoster Recombinant(Shingrix) 01/14/2019  . Zoster, Live 07/21/2007   Pertinent  Health Maintenance Due  Topic Date Due  . OPHTHALMOLOGY EXAM  Never done  . HEMOGLOBIN A1C  04/15/2023  . FOOT EXAM  02/20/2024  . DEXA SCAN  Completed  . INFLUENZA VACCINE  Discontinued      04/12/2019    2:21 PM 07/19/2019    2:20 PM 09/28/2019    8:51 AM 06/21/2020    5:59 PM 09/23/2021    1:10 PM  Fall Risk  Falls in the past year? 0 0     (RETIRED) Patient Fall Risk Level Low fall risk Low fall risk Moderate fall risk Low fall risk Low fall risk  Fall risk Follow up Falls evaluation completed Falls evaluation completed      Functional Status Survey:  Vitals:   05/27/23 1342  BP: 116/69  Pulse: 66  Resp: 16  Temp: (!) 97.1 F (36.2 C)  SpO2: 96%  Weight: 114 lb 12.8 oz (52.1 kg)   Body mass index is 19.1 kg/m. Physical Exam Vitals and nursing note reviewed.  Constitutional:      Comments: fatigue  HENT:     Head: Normocephalic and atraumatic.     Nose: Nose normal.     Mouth/Throat:     Mouth: Mucous membranes are moist.  Eyes:     Extraocular Movements: Extraocular movements intact.     Conjunctiva/sclera: Conjunctivae normal.     Pupils: Pupils are equal, round, and reactive to light.  Cardiovascular:     Rate and Rhythm: Normal rate and regular rhythm.     Heart sounds: No murmur heard. Pulmonary:     Effort: Pulmonary effort is normal.     Breath sounds: Rales present. No wheezing or rhonchi.     Comments: Bibasilar rales.  Abdominal:     Palpations: Abdomen is soft.     Tenderness: There is no abdominal tenderness.  Musculoskeletal:        General: No tenderness.     Cervical  back: Normal range of motion and neck supple.     Right lower leg: No edema.     Left lower leg: No edema.     Comments: R+L shoulder limited overhead ROM, mild R shoulder pain. S/p R+L shoulder replacement.   Skin:    General: Skin is warm and dry.     Comments: skin breakdown R+L buttocks, a dime sized open area left buttock, suspected the cause of bleeding reported, no active bleeding or s/s of infection, noted non blanchable redness R+L buttocks   Neurological:     General: No focal deficit present.     Mental Status: She is alert and oriented to person, place, and time. Mental status is at baseline.     Gait: Gait abnormal.  Psychiatric:        Mood and Affect: Mood normal.        Behavior: Behavior normal.        Thought Content: Thought content normal.    Labs reviewed: Recent Labs    01/06/23 0926 01/07/23 0425 01/08/23 0413 01/16/23 0000 05/24/23 0701 05/25/23 0603 05/26/23 0540  NA 134* 134* 135   < > 133* 132* 133*  K 3.6 3.6 3.1*   < > 3.7 3.5 3.3*  CL 106 106 106   < > 103 104 106  CO2 22 19* 22   < > 21* 22 20*  GLUCOSE 115* 89 95   < > 96 104* 93  BUN 14 13 13    < > 12 10 10   CREATININE 0.62 0.65 0.61   < > 0.80 0.79 0.67  CALCIUM 9.9 9.2 9.1   < > 9.7 9.2 9.3  MG 1.9 2.2 2.4   < > 1.4* 2.1 1.7  PHOS 2.0* 2.6 2.2*  --   --   --   --    < > = values in this interval not displayed.   Recent Labs    12/28/22 1256 01/01/23 0416 01/08/23 0413 01/16/23 0000 01/19/23 0000 03/03/23 0000 05/22/23 1116  AST 17  --  17   < > 10* 12* 16  ALT 14  --  11   < > 3* 5* 7  ALKPHOS 85  --  64   < > 79 76 77  BILITOT  0.8  --  0.6  --   --   --  0.8  PROT 6.5  --  5.6*  --   --   --  6.1*  ALBUMIN 3.0*   < > 2.7*   < > 2.6* 3.2* 2.8*   < > = values in this interval not displayed.   Recent Labs    01/26/23 0718 01/29/23 0000 03/03/23 0000 05/22/23 1116 05/23/23 0622 05/24/23 0701 05/25/23 0603 05/26/23 0540  WBC 6.0   < > 5.2 8.8   < > 7.6 6.7 6.8   NEUTROABS 3.9  --  3,474.00 6.3  --   --   --   --   HGB 7.9*   < > 10.0* 10.7*   < > 10.7* 9.6* 9.2*  HCT 26.1*   < > 31* 34.2*   < > 33.2* 29.4* 28.8*  MCV 94.6  --   --  86.8   < > 84.3 83.1 84.7  PLT 263   < > 258 211   < > 206 183 181   < > = values in this interval not displayed.   Lab Results  Component Value Date   TSH 1.438 05/22/2023   Lab Results  Component Value Date   HGBA1C 6.0 (H) 10/13/2022   Lab Results  Component Value Date   CHOL 121 10/13/2022   HDL 44 10/13/2022   LDLCALC 62 10/13/2022   TRIG 74 10/13/2022   CHOLHDL 2.8 10/13/2022    Significant Diagnostic Results in last 30 days:  MR BRAIN WO CONTRAST Result Date: 05/22/2023 CLINICAL DATA:  Mental status change EXAM: MRI HEAD WITHOUT CONTRAST TECHNIQUE: Multiplanar, multiecho pulse sequences of the brain and surrounding structures were obtained without intravenous contrast. COMPARISON:  Same-day head CT. FINDINGS: Brain: No acute infarct. No evidence of intracranial hemorrhage. Scattered and confluent FLAIR hyperintensity in the periventricular and subcortical white matter suggestive of moderate chronic microvascular ischemic changes. Additional FLAIR signal abnormality in the pons likely related to the same. Remote lacunar infarcts in the bilateral basal ganglia and right corona radiata. No mass lesion or midline shift. Cerebellum is unremarkable. Normal appearance of midline structures. The basilar cisterns are patent. No extra-axial fluid collections. Ventricles: Prominence of the lateral ventricles suggestive of underlying parenchymal volume loss. Vascular: Skull base flow voids are visualized. Skull and upper cervical spine: No focal abnormality. Sinuses/Orbits: Orbits are symmetric. Paranasal sinuses are clear. Other: Mastoid air cells are clear. IMPRESSION: No acute intracranial abnormality. Moderate chronic microvascular ischemic changes. Remote lacunar infarcts in the right corona radiata and bilateral basal  ganglia. Mild generalized parenchymal volume loss. Electronically Signed   By: Emily Filbert M.D.   On: 05/22/2023 19:20   DG Chest Port 1 View Result Date: 05/22/2023 CLINICAL DATA:  83 year old female with questionable sepsis EXAM: PORTABLE CHEST 1 VIEW COMPARISON:  12/22/2022 FINDINGS: Cardiomediastinal silhouette unchanged in size and contour. No evidence of central vascular congestion. No interlobular septal thickening. Low lung volumes. Opacity at the left lung base obscuring the left hemidiaphragm and the left heart border. No pneumothorax No acute displaced fracture. Surgical changes of the shoulders incompletely imaged IMPRESSION: Low lung volumes with opacity at the left lung base, potentially pneumonia/consolidation or atelectasis. Electronically Signed   By: Gilmer Mor D.O.   On: 05/22/2023 13:55   CT Head Wo Contrast Result Date: 05/22/2023 CLINICAL DATA:  Mental status change, altered mental status. EXAM: CT HEAD WITHOUT CONTRAST TECHNIQUE: Contiguous axial images were obtained from the base of  the skull through the vertex without intravenous contrast. RADIATION DOSE REDUCTION: This exam was performed according to the departmental dose-optimization program which includes automated exposure control, adjustment of the mA and/or kV according to patient size and/or use of iterative reconstruction technique. COMPARISON:  MRI head 12/28/2022, CT head 10/12/2022 FINDINGS: Brain: No acute intracranial hemorrhage. No CT evidence of acute infarct. Nonspecific hypoattenuation in the periventricular and subcortical white matter favored to reflect chronic microvascular ischemic changes. Remote lacunar infarct in the right corona radiata. No edema, mass effect, or midline shift. The basilar cisterns are patent. Ventricles: The ventricles are normal. Vascular: Atherosclerotic calcifications of the carotid siphons and intracranial vertebral arteries. No hyperdense vessel. Skull: No acute or aggressive  finding. Chronic appearing bilateral nasal bone deformities. Orbits: Orbits are symmetric. Sinuses: Mild mucosal thickening in the ethmoid sinuses. Other: Mastoid air cells are clear. IMPRESSION: No CT evidence of acute intracranial abnormality. Moderate chronic microvascular ischemic changes. Small remote infarct in the right corona radiata. Electronically Signed   By: Emily Filbert M.D.   On: 05/22/2023 12:48   DG ESOPHAGUS W SINGLE CM (SOL OR THIN BA) Result Date: 05/19/2023 CLINICAL DATA:  Reflux esophagitis. EXAM: ESOPHOGRAM/BARIUM SWALLOW TECHNIQUE: Single contrast examination was performed using thin barium. FLUOROSCOPY: Radiation Exposure Index (as provided by the fluoroscopic device): 5.2 mGy Kerma COMPARISON:  None Available. FINDINGS: The examination was severely limited due to limited patient mobility. The patient was unable to stand or get into a near prone/RAO position. The examination was therefore performed with the patient recumbent in the RPO and supine positions. The pharynx and cervical esophagus were not well evaluated. The thoracic esophagus appears normal in caliber without evidence of a gross mass or stricture. No substantial esophageal dysmotility was seen for age. There is a small sliding hiatal hernia. No gastroesophageal reflux was observed. A barium tablet was not administered. IMPRESSION: 1. Severely limited examination as detailed above. 2. Small sliding hiatal hernia. No gastroesophageal reflux was observed during this examination. 3. No evidence of an esophageal stricture. Electronically Signed   By: Sebastian Ache M.D.   On: 05/19/2023 15:59    Assessment/Plan: Acute encephalopathy Hospitalized 05/22/23-05/26/23 for AMS, f/u CBC/BMP. Negative CT head/MRI brain and metabolic workup and UA, treated with Rocephin x3 days anyway. Unclear etiology.      05/22/23 the patient was found in recliner in near fetus position, repetitively saying put my legs down, but yelling out when  assistance attempted. The patient stated she cannot open her eyes when asked to. The patient is oriented to person, place. No noted focal weakness. She is afebrile, no O2 desaturation.   Baseline mentation today.   Hypokalemia serum K 2.7 04/17/23<<3.5 05/14/23>>3.3 05/26/23, on  Kcl supplement, repeat BMP 06/02/23  Hyponatremia Na 133 05/26/23, repeat BMP 06/02/23  Reflux esophagitis Pantoprazole, Famotidine, Sucralfate. Followed by GI, MRI and barium swallow study.   IDA (iron deficiency anemia) added Vit B12(Vit B12 273 12/10/22) and Fe 01/15/23. 01/30/23 Cardiology dc'd ASA, Hgb 9.2 05/26/23, update CBC/diff 06/02/23  Atrial fibrillation (HCC) Heart rate is in control, resumed Xarelto, followed by Cardiology  Osteoarthritis R+L knee pain, f/u Ortho for Gel inj, ambulates with walker.   History of stroke resolved difficulty word finding, off Statin, ASA, f/u stroke clinic, MRI small punctate infarct in the posteerior limb of R internal capsule, no focal weakness  Gout  stable, takes Allopurinol  Hyperlipidemia the patient desires to resume Atorvastatin and denied any allergic reaction, LDL 62 10/13/22, LDL 69 04/02/23, f/u lipid  panel 6 months.  Essential hypertension Blood pressure is controlled, on Hydralazine,  prn Clonidine  Prediabetes  Prediabetes Hgb A1c 6.0 10/13/22, TSH 1.293 10/12/22, ACR 242(<30)02/26/23  History of urinary retention   Hx of urinary retention, taking Tamsulosin. F/u urology    Family/ staff Communication: plan of care reviewed with the patient and charge nurse.   Labs/tests ordered:  CBC/diff, BMP

## 2023-05-28 ENCOUNTER — Non-Acute Institutional Stay (SKILLED_NURSING_FACILITY): Payer: Self-pay | Admitting: Sports Medicine

## 2023-05-28 DIAGNOSIS — I4891 Unspecified atrial fibrillation: Secondary | ICD-10-CM | POA: Diagnosis not present

## 2023-05-28 DIAGNOSIS — N179 Acute kidney failure, unspecified: Secondary | ICD-10-CM

## 2023-05-28 DIAGNOSIS — M6281 Muscle weakness (generalized): Secondary | ICD-10-CM

## 2023-05-28 DIAGNOSIS — K219 Gastro-esophageal reflux disease without esophagitis: Secondary | ICD-10-CM | POA: Diagnosis not present

## 2023-05-28 DIAGNOSIS — N390 Urinary tract infection, site not specified: Secondary | ICD-10-CM

## 2023-05-28 LAB — CULTURE, BLOOD (ROUTINE X 2)
Culture: NO GROWTH
Culture: NO GROWTH

## 2023-05-28 NOTE — Progress Notes (Unsigned)
 Provider:  Dr. Venita Sheffield Location:  [facility] Place of Service:   Skilled care   PCP: Sigmund Hazel, MD Patient Care Team: Sigmund Hazel, MD as PCP - General (Family Medicine) Tessa Lerner, DO as PCP - Cardiology (Cardiology) Suzi Roots as Physician Assistant (Dermatology)  Extended Emergency Contact Information Primary Emergency Contact: [place], [place] [address] D[place] of [place] Home Phone: [phone] Work Phone: [phone] Mobile Phone: [phone] Relation: Son Secondary Emergency Contact: Santy,[name]  [place] Phone: [phone] Relation: Son  Goals of Care: Advanced Directive information    05/22/2023   11:08 AM  Advanced Directives  Does Patient Have a Medical Advance Directive? No  Would patient like information on creating a medical advance directive? No - Guardian declined      No chief complaint on file.   HPI: Patient is a 83 y.o. female seen today for admission to [facility].    Patient seen and examined in her room. Her family visiting her.  She did physical therapy this morning. She is able to walk with staff holding her gait belt. Patient denies being dizzy, lightheadedness. She knows her name, oriented to place.  She can tell me what she had for breakfast this morning. Patient denies cough, shortness of breath, headache, runny nose, cough, congestion, abdominal pain, nausea, vomiting, dysuria, hematuria, bloody or dark-colored stools. She recently completed antibiotics for questionable UTI in the hospital. Patient is wondering if she can take prophylactic antibiotics to prevent urinary tract infections. She wants to see a urologist.      As per d/c summary   Brief/Interim Summary: Brief Narrative:   83 y.o. female with medical history significant of GERD, DM 2, diastolic CHF, paroxysmal atrial fibrillation on Xarelto, HLD, HTN, vertigo,  osteoarthritis sent to the hospital from her facility for evaluation of mental status change.  History is limited as patient is very poor historian.  Thus far CT head, MRI brain are negative for any acute pathology but does show chronic changes.  Metabolic workup is also negative.  UA shows bacteriuria but no signs of active infection.  But given prior presentation, started on IV Rocephin.  PT/OT recommended HH/SNF.  TOC consulted. Patient completed 3 days of Rocephin in the hospital and doing much better feeling back to baseline.   Patient is currently awaiting placement at SNF  Past Medical History:  Diagnosis Date   CKD (chronic kidney disease), stage II    nephrologist-- dr Kathrene Bongo  [facility])   Diabetes mellitus type 2, diet-controlled (HCC)    followed by pcp---   (09-20-2019  per pt does not check blood sugar at home)   Fracture of humeral shaft, right, closed 04/25/2022   GERD (gastroesophageal reflux disease)    History of primary hyperparathyroidism    s/p  parathyroidectomy right inferior on 12-02-2012   Humerus fracture 04/25/2022   Hyperlipidemia    Hypertension    followd by nephrologist----  (09-20-2019  per pt had stress test done some time ago , unsure when/ where, thinks told ok)   OA (osteoarthritis)    Osteoporosis    Prosthetic joint infection (HCC)    followed by dr j. hatcher (ID)   left total shoulder arthroplasty 12/ 2016  ; 12/ 2020  revision with explant joint replacement and hemiarthroplasty;  completed 6 month antibiotic 05/ 2021   Vertigo    Past Surgical History:  Procedure Laterality Date  COLONOSCOPY     DILATATION & CURETTAGE/HYSTEROSCOPY WITH MYOSURE N/A 09/28/2019   Procedure: DILATATION & CURETTAGE/HYSTEROSCOPY WITH MYOSURE;  Surgeon: Olivia Mackie, MD;  Location: [facility];  Service: Gynecology;  Laterality: N/A;   EYE SURGERY  yrs ago   laser surgery for retinal tear.  (unilateral , pt unsure which side)   ORIF  HUMERUS FRACTURE Right 04/27/2022   Procedure: OPEN REDUCTION INTERNAL FIXATION (ORIF) PROXIMAL HUMERUS FRACTURE;  Surgeon: Bjorn Pippin, MD;  Location: [facility];  Service: Orthopedics;  Laterality: Right;   PARATHYROIDECTOMY N/A 12/02/2012   Procedure: PARATHYROIDECTOMY with frozen section ;  Surgeon: Velora Heckler, MD;  Location: [facility];  Service: General;  Laterality: N/A;   REVERSE SHOULDER ARTHROPLASTY Right 04/27/2022   Procedure: REVERSE SHOULDER ARTHROPLASTY;  Surgeon: Bjorn Pippin, MD;  Location: [facility];  Service: Orthopedics;  Laterality: Right;   SHOULDER INJECTION Left 07/27/2013   Procedure: SHOULDER INJECTION;  Surgeon: Loreta Ave, MD;  Location: [facility] OR;  Service: Orthopedics;  Laterality: Left;   STERIOD INJECTION Right 02/14/2015   Procedure: RIGHT SHOULDER CORTISONE INJECTION;  Surgeon: Loreta Ave, MD;  Location: [facility] OR;  Service: Orthopedics;  Laterality: Right;   TOTAL HIP ARTHROPLASTY Left 07/27/2013   Procedure: TOTAL HIP ARTHROPLASTY ANTERIOR APPROACH;  Surgeon: Loreta Ave, MD;  Location: [facility] OR;  Service: Orthopedics;  Laterality: Left;   TOTAL SHOULDER ARTHROPLASTY Left 02/14/2015   Procedure: LEFT TOTAL SHOULDER ARTHROPLASTY;  Surgeon: Loreta Ave, MD;  Location: [facility] OR;  Service: Orthopedics;  Laterality: Left;   TOTAL SHOULDER REVISION Left 02/09/2019   Procedure: TOTAL SHOULDER REVISION;  Surgeon: Bjorn Pippin, MD;  Location: [facility];  Service: Orthopedics;  Laterality: Left;    reports that she quit smoking about 35 years ago. Her smoking use included cigarettes. She started smoking about 55 years ago. She has a 20 pack-year smoking history. She has never used smokeless tobacco. She reports that she does not drink alcohol and does not use drugs. Social History   Socioeconomic History   Marital status: Widowed    Spouse name: Not on file   Number of children: Not on file   Years of education: Not on file   Highest education level: Not on file  Occupational  History   Not on file  Tobacco Use   Smoking status: Former    Current packs/day: 0.00    Average packs/day: 1 pack/day for 20.0 years (20.0 ttl pk-yrs)    Types: Cigarettes    Start date: 10/28/1967    Quit date: 10/28/1987    Years since quitting: 35.6   Smokeless tobacco: Never  Vaping Use   Vaping status: Never Used  Substance and Sexual Activity   Alcohol use: No   Drug use: Never   Sexual activity: Yes    Birth control/protection: Post-menopausal  Other Topics Concern   Not on file  Social History Narrative   Not on file   Social Drivers of Health   Financial Resource Strain: Not on file  Food Insecurity: No Food Insecurity (05/22/2023)   Hunger Vital Sign    Worried About Running Out of Food in the Last Year: Never true    Ran Out of Food in the Last Year: Never true  Transportation Needs: No Transportation Needs (05/22/2023)   PRAPARE - Administrator, Civil Service (Medical): No    Lack of Transportation (Non-Medical): No  Physical Activity: Not on file  Stress: Not on  file  Social Connections: Unknown (05/22/2023)   Social Connection and Isolation Panel [NHANES]    Frequency of Communication with Friends and Family: Patient unable to answer    Frequency of Social Gatherings with Friends and Family: Patient unable to answer    Attends Religious Services: Patient unable to answer    Active Member of Clubs or Organizations: Patient unable to answer    Attends Banker Meetings: Patient unable to answer    Marital Status: Widowed  Intimate Partner Violence: Not At Risk (05/22/2023)   Humiliation, Afraid, Rape, and Kick questionnaire    Fear of Current or Ex-Partner: No    Emotionally Abused: No    Physically Abused: No    Sexually Abused: No    Functional Status Survey:    Family History  Problem Relation Age of Onset   Heart failure Mother    Cancer Mother    Breast cancer Mother        in her 2s   Cancer Father     Health  Maintenance  Topic Date Due   OPHTHALMOLOGY EXAM  Never done   HEMOGLOBIN A1C  04/15/2023   FOOT EXAM  02/20/2024   Medicare Annual Wellness (AWV)  02/20/2024   Diabetic kidney evaluation - Urine ACR  02/26/2024   Diabetic kidney evaluation - eGFR measurement  05/25/2024   DTaP/Tdap/Td (4 - Td or Tdap) 06/09/2027   Pneumonia Vaccine 62+ Years old  Completed   DEXA SCAN  Completed   HPV VACCINES  Aged Out   INFLUENZA VACCINE  Discontinued   COVID-19 Vaccine  Discontinued   Zoster Vaccines- Shingrix  Discontinued    Allergies  Allergen Reactions   Abaloparatide Other (See Comments)    "Burred vision, lightheaded" (patient does not recall this in 2024)   Mobic [Meloxicam] Hypertension   Crestor [Rosuvastatin] Other (See Comments)    Muscle aches   Norvasc [Amlodipine Besylate] Swelling and Other (See Comments)    Ankle swelling   Simvastatin Other (See Comments)    Muscle aches, high LFTs   Sulfa Antibiotics Rash   Zetia [Ezetimibe] Other (See Comments)    Muscle aches    Outpatient Encounter Medications as of 05/28/2023  Medication Sig   acetaminophen (TYLENOL) 325 MG tablet Take 650 mg by mouth every 4 (four) hours as needed for moderate pain (pain score 4-6).   allopurinol (ZYLOPRIM) 300 MG tablet Take 300 mg by mouth daily.   atorvastatin (LIPITOR) 10 MG tablet Take 10 mg by mouth daily.   cloNIDine (CATAPRES) 0.1 MG tablet Take 0.1 mg by mouth daily as needed (for hypertension- if systolic b/p is over 160).   cyanocobalamin 1000 MCG tablet Take 1,000 mcg by mouth daily.   diclofenac Sodium (VOLTAREN) 1 % GEL Apply 2-4 g topically 4 (four) times daily.   docusate sodium (COLACE) 100 MG capsule Take 100 mg by mouth 2 (two) times daily.   famotidine (PEPCID) 20 MG tablet Take 1 tablet (20 mg total) by mouth 2 (two) times daily.   ferrous sulfate 220 (44 Fe) MG/5ML solution Take 7.5 mLs by mouth 3 (three) times a week. Monday, Wednesday, Friday   hydrALAZINE (APRESOLINE) 100  MG tablet Take 1 tablet (100 mg total) by mouth 3 (three) times daily.   meclizine (ANTIVERT) 12.5 MG tablet Take 1 tablet (12.5 mg total) by mouth 3 (three) times daily as needed for dizziness.   pantoprazole (PROTONIX) 40 MG tablet Take 1 tablet (40 mg total) by mouth 2 (  two) times daily.   Rivaroxaban (XARELTO) 15 MG TABS tablet Take 1 tablet (15 mg total) by mouth daily with supper.   senna-docusate (SENOKOT-S) 8.6-50 MG tablet Take 1 tablet by mouth 2 (two) times daily. (Patient not taking: Reported on 03/31/2023)   sucralfate (CARAFATE) 1 GM/10ML suspension Take 10 mLs (1 g total) by mouth 4 (four) times daily -  with meals and at bedtime. Give  1 hour before meals and at bedtime on an empt stomach   tamsulosin (FLOMAX) 0.4 MG CAPS capsule Take 1 capsule (0.4 mg total) by mouth daily.   Zinc Oxide 10 % OINT Apply 1 Application topically See admin instructions. 1 application to buttocks every shift for redness   No facility-administered encounter medications on file as of 05/28/2023.    Review of Systems  Constitutional:  Negative for chills and fever.  HENT:  Negative for sinus pressure and sore throat.   Respiratory:  Negative for cough, shortness of breath and wheezing.   Cardiovascular:  Negative for chest pain and palpitations.  Gastrointestinal:  Negative for abdominal distention, abdominal pain, blood in stool, constipation, diarrhea and vomiting.  Genitourinary:  Negative for dysuria, frequency and urgency.  Neurological:  Negative for dizziness.    There were no vitals filed for this visit. There is no height or weight on file to calculate BMI. BP Readings from Last 3 Encounters:  05/27/23 116/69  05/26/23 132/74  05/22/23 134/64   Wt Readings from Last 3 Encounters:  05/27/23 114 lb 12.8 oz (52.1 kg)  05/26/23 113 lb 1.5 oz (51.3 kg)  05/22/23 120 lb (54.4 kg)   Physical Exam Constitutional:      Appearance: Normal appearance.  HENT:     Head: Normocephalic and  atraumatic.  Cardiovascular:     Rate and Rhythm: Normal rate and regular rhythm.  Pulmonary:     Effort: Pulmonary effort is normal. No respiratory distress.     Breath sounds: Normal breath sounds. No wheezing.  Abdominal:     General: Bowel sounds are normal. There is no distension.     Tenderness: There is no abdominal tenderness. There is no guarding or rebound.     Comments:    Neurological:     Mental Status: She is alert. Mental status is at baseline.     Motor: No weakness.     Labs reviewed: Basic Metabolic Panel: Recent Labs    01/06/23 0926 01/07/23 0425 01/08/23 0413 01/16/23 0000 05/24/23 0701 05/25/23 0603 05/26/23 0540  NA 134* 134* 135   < > 133* 132* 133*  K 3.6 3.6 3.1*   < > 3.7 3.5 3.3*  CL 106 106 106   < > 103 104 106  CO2 22 19* 22   < > 21* 22 20*  GLUCOSE 115* 89 95   < > 96 104* 93  BUN 14 13 13    < > 12 10 10   CREATININE 0.62 0.65 0.61   < > 0.80 0.79 0.67  CALCIUM 9.9 9.2 9.1   < > 9.7 9.2 9.3  MG 1.9 2.2 2.4   < > 1.4* 2.1 1.7  PHOS 2.0* 2.6 2.2*  --   --   --   --    < > = values in this interval not displayed.   Liver Function Tests: Recent Labs    12/28/22 1256 01/01/23 0416 01/08/23 0413 01/16/23 0000 01/19/23 0000 03/03/23 0000 05/22/23 1116  AST 17  --  17   < >  10* 12* 16  ALT 14  --  11   < > 3* 5* 7  ALKPHOS 85  --  64   < > 79 76 77  BILITOT 0.8  --  0.6  --   --   --  0.8  PROT 6.5  --  5.6*  --   --   --  6.1*  ALBUMIN 3.0*   < > 2.7*   < > 2.6* 3.2* 2.8*   < > = values in this interval not displayed.   Recent Labs    10/12/22 2013 05/22/23 1450  LIPASE 26 28   Recent Labs    10/12/22 2104 12/28/22 1256 05/22/23 1450  AMMONIA <10 <10 13   CBC: Recent Labs    01/26/23 0718 01/29/23 0000 03/03/23 0000 05/22/23 1116 05/23/23 0622 05/24/23 0701 05/25/23 0603 05/26/23 0540  WBC 6.0   < > 5.2 8.8   < > 7.6 6.7 6.8  NEUTROABS 3.9  --  3,474.00 6.3  --   --   --   --   HGB 7.9*   < > 10.0* 10.7*   < >  10.7* 9.6* 9.2*  HCT 26.1*   < > 31* 34.2*   < > 33.2* 29.4* 28.8*  MCV 94.6  --   --  86.8   < > 84.3 83.1 84.7  PLT 263   < > 258 211   < > 206 183 181   < > = values in this interval not displayed.   Cardiac Enzymes: No results for input(s): "CKTOTAL", "CKMB", "CKMBINDEX", "TROPONINI" in the last 8760 hours. BNP: Invalid input(s): "POCBNP" Lab Results  Component Value Date   HGBA1C 6.0 (H) 10/13/2022   Lab Results  Component Value Date   TSH 1.438 05/22/2023   Lab Results  Component Value Date   VITAMINB12 1,459 (H) 05/22/2023   Lab Results  Component Value Date   FOLATE 11.7 01/03/2023   Lab Results  Component Value Date   IRON 16 01/19/2023   TIBC 155 01/19/2023   FERRITIN 88 01/19/2023    Imaging and Procedures obtained prior to SNF admission: MR BRAIN WO CONTRAST Result Date: 05/22/2023 CLINICAL DATA:  Mental status change EXAM: MRI HEAD WITHOUT CONTRAST TECHNIQUE: Multiplanar, multiecho pulse sequences of the brain and surrounding structures were obtained without intravenous contrast. COMPARISON:  Same-day head CT. FINDINGS: Brain: No acute infarct. No evidence of intracranial hemorrhage. Scattered and confluent FLAIR hyperintensity in the periventricular and subcortical white matter suggestive of moderate chronic microvascular ischemic changes. Additional FLAIR signal abnormality in the pons likely related to the same. Remote lacunar infarcts in the bilateral basal ganglia and right corona radiata. No mass lesion or midline shift. Cerebellum is unremarkable. Normal appearance of midline structures. The basilar cisterns are patent. No extra-axial fluid collections. Ventricles: Prominence of the lateral ventricles suggestive of underlying parenchymal volume loss. Vascular: Skull base flow voids are visualized. Skull and upper cervical spine: No focal abnormality. Sinuses/Orbits: Orbits are symmetric. Paranasal sinuses are clear. Other: Mastoid air cells are clear.  IMPRESSION: No acute intracranial abnormality. Moderate chronic microvascular ischemic changes. Remote lacunar infarcts in the right corona radiata and bilateral basal ganglia. Mild generalized parenchymal volume loss. Electronically Signed   By: Emily Filbert M.D.   On: 05/22/2023 19:20   DG Chest Port 1 View Result Date: 05/22/2023 CLINICAL DATA:  83 year old female with questionable sepsis EXAM: PORTABLE CHEST 1 VIEW COMPARISON:  12/22/2022 FINDINGS: Cardiomediastinal silhouette unchanged in size and contour.  No evidence of central vascular congestion. No interlobular septal thickening. Low lung volumes. Opacity at the left lung base obscuring the left hemidiaphragm and the left heart border. No pneumothorax No acute displaced fracture. Surgical changes of the shoulders incompletely imaged IMPRESSION: Low lung volumes with opacity at the left lung base, potentially pneumonia/consolidation or atelectasis. Electronically Signed   By: Gilmer Mor D.O.   On: 05/22/2023 13:55   CT Head Wo Contrast Result Date: 05/22/2023 CLINICAL DATA:  Mental status change, altered mental status. EXAM: CT HEAD WITHOUT CONTRAST TECHNIQUE: Contiguous axial images were obtained from the base of the skull through the vertex without intravenous contrast. RADIATION DOSE REDUCTION: This exam was performed according to the departmental dose-optimization program which includes automated exposure control, adjustment of the mA and/or kV according to patient size and/or use of iterative reconstruction technique. COMPARISON:  MRI head 12/28/2022, CT head 10/12/2022 FINDINGS: Brain: No acute intracranial hemorrhage. No CT evidence of acute infarct. Nonspecific hypoattenuation in the periventricular and subcortical white matter favored to reflect chronic microvascular ischemic changes. Remote lacunar infarct in the right corona radiata. No edema, mass effect, or midline shift. The basilar cisterns are patent. Ventricles: The ventricles are  normal. Vascular: Atherosclerotic calcifications of the carotid siphons and intracranial vertebral arteries. No hyperdense vessel. Skull: No acute or aggressive finding. Chronic appearing bilateral nasal bone deformities. Orbits: Orbits are symmetric. Sinuses: Mild mucosal thickening in the ethmoid sinuses. Other: Mastoid air cells are clear. IMPRESSION: No CT evidence of acute intracranial abnormality. Moderate chronic microvascular ischemic changes. Small remote infarct in the right corona radiata. Electronically Signed   By: Emily Filbert M.D.   On: 05/22/2023 12:48    Assessment and Plan     Metabolic encephalopathy Patient is back to her baseline CT head, MRI brain with no acute pathology Patient completed antibiotics in the hospital Monitor for change in mentation     Generalized weakness Continue with PT/OT  AKI  improved Avoid nephrotoxic medications Monitor creatinine  History of A-fib Echo from 10/13/2022-showed EF of 60 to 65%. Rate controlled No signs of bleeding Continue with Xarelto  History of GERD Continue with Pepcid, pantoprazole  Recurrent UTI Informed patient that her recent urine results look like contamination but she thinks  she had  a UTI  Discussed with patient in detail about the risk of starting prophylactic antibiotics as they did not prevent urinary tract infections, increases risk of c diff diarrhea and resistance.   35 min Total time spent for obtaining history,  performing a medically appropriate examination and evaluation, reviewing the tests,  documenting clinical information in the electronic or other health record,care coordination (not separately reported)    Family/ staff Communication:   Labs/tests ordered:  Venita Sheffield

## 2023-05-29 ENCOUNTER — Encounter: Payer: Self-pay | Admitting: Sports Medicine

## 2023-06-02 DIAGNOSIS — N3000 Acute cystitis without hematuria: Secondary | ICD-10-CM | POA: Diagnosis not present

## 2023-06-02 DIAGNOSIS — I10 Essential (primary) hypertension: Secondary | ICD-10-CM | POA: Diagnosis not present

## 2023-06-02 LAB — CBC AND DIFFERENTIAL
HCT: 30 — AB (ref 36–46)
Hemoglobin: 9.6 — AB (ref 12.0–16.0)
Neutrophils Absolute: 2568
Platelets: 211 10*3/uL (ref 150–400)
WBC: 4.8

## 2023-06-02 LAB — BASIC METABOLIC PANEL
BUN: 14 (ref 4–21)
CO2: 24 — AB (ref 13–22)
Chloride: 107 (ref 99–108)
Creatinine: 0.6 (ref 0.5–1.1)
Glucose: 86
Potassium: 3 meq/L — AB (ref 3.5–5.1)
Sodium: 137 (ref 137–147)

## 2023-06-02 LAB — COMPREHENSIVE METABOLIC PANEL
Calcium: 9.8 (ref 8.7–10.7)
eGFR: 88

## 2023-06-02 LAB — CBC: RBC: 3.5 — AB (ref 3.87–5.11)

## 2023-06-03 ENCOUNTER — Encounter: Payer: Self-pay | Admitting: Adult Health

## 2023-06-03 ENCOUNTER — Non-Acute Institutional Stay (SKILLED_NURSING_FACILITY): Payer: Self-pay | Admitting: Adult Health

## 2023-06-03 DIAGNOSIS — I10 Essential (primary) hypertension: Secondary | ICD-10-CM | POA: Diagnosis not present

## 2023-06-03 DIAGNOSIS — E876 Hypokalemia: Secondary | ICD-10-CM

## 2023-06-03 DIAGNOSIS — I4891 Unspecified atrial fibrillation: Secondary | ICD-10-CM

## 2023-06-03 DIAGNOSIS — N390 Urinary tract infection, site not specified: Secondary | ICD-10-CM

## 2023-06-03 NOTE — Progress Notes (Signed)
 Error   This encounter was created in error - please disregard.

## 2023-06-03 NOTE — Progress Notes (Signed)
 Location:  [facility] Room Number: 29 A Place of Service:  SNF [phone]) Provider:  Gillis Santa, NP    Patient Care Team: Sigmund Hazel, MD as PCP - General (Family Medicine) Tessa Lerner, DO as PCP - Cardiology (Cardiology) Suzi Roots as Physician Assistant (Dermatology)  Extended Emergency Contact Information Primary Emergency Contact: [name], [place] [address] [place] Home Phone: [phone] Work Phone: [phone] Mobile Phone: [phone] Relation: Son Secondary Emergency Contact: Wint,[name]  [place] Phone: [phone] Relation: Son  Code Status: DRN Goals of care: Advanced Directive information    05/22/2023   11:08 AM  Advanced Directives  Does Patient Have a Medical Advance Directive? No  Would patient like information on creating a medical advance directive? No - Guardian declined     Chief Complaint  Patient presents with   Acute Visit    Low potassium    HPI:  Pt is a 83 y.o. female seen today for an acute visit for low potassium. She is a resident of [facility].  Latest K 3.0, 06/02/23.   BP 158/67, repeat BP 117/65. She takes Clonidine 0.1 mg Q D PRN and Hydralazine 100 mg TID.  She takes Xarelto 15 mg daily for atrial fibrillation.   Past Medical History:  Diagnosis Date   CKD (chronic kidney disease), stage II    nephrologist-- dr Kathrene Bongo  [facility])   Diabetes mellitus type 2, diet-controlled (HCC)    followed by pcp---   (09-20-2019  per pt does not check blood sugar at home)   Fracture of humeral shaft, right, closed 04/25/2022   GERD (gastroesophageal reflux disease)    History of primary hyperparathyroidism    s/p  parathyroidectomy right inferior on 12-02-2012   Humerus fracture 04/25/2022   Hyperlipidemia    Hypertension    followd by nephrologist----  (09-20-2019  per pt had stress test done some  time ago , unsure when/ where, thinks told ok)   OA (osteoarthritis)    Osteoporosis    Prosthetic joint infection (HCC)    followed by dr j. hatcher (ID)   left total shoulder arthroplasty 12/ 2016  ; 12/ 2020  revision with explant joint replacement and hemiarthroplasty;  completed 6 month antibiotic 05/ 2021   Vertigo    Past Surgical History:  Procedure Laterality Date   COLONOSCOPY     DILATATION & CURETTAGE/HYSTEROSCOPY WITH MYOSURE N/A 09/28/2019   Procedure: DILATATION & CURETTAGE/HYSTEROSCOPY WITH MYOSURE;  Surgeon: Olivia Mackie, MD;  Location: [facility] [place];  Service: Gynecology;  Laterality: N/A;   EYE SURGERY  yrs ago   laser surgery for retinal tear.  (unilateral , pt unsure which side)   ORIF HUMERUS FRACTURE Right 04/27/2022   Procedure: OPEN REDUCTION INTERNAL FIXATION (ORIF) PROXIMAL HUMERUS FRACTURE;  Surgeon: Bjorn Pippin, MD;  Location: [facility];  Service: Orthopedics;  Laterality: Right;   PARATHYROIDECTOMY N/A 12/02/2012   Procedure: PARATHYROIDECTOMY with frozen section ;  Surgeon: Velora Heckler, MD;  Location: [facility];  Service: General;  Laterality: N/A;   REVERSE SHOULDER ARTHROPLASTY Right 04/27/2022   Procedure: REVERSE SHOULDER ARTHROPLASTY;  Surgeon: Bjorn Pippin, MD;  Location: [facility];  Service: Orthopedics;  Laterality: Right;   SHOULDER INJECTION Left 07/27/2013   Procedure: SHOULDER INJECTION;  Surgeon: Loreta Ave, MD;  Location: [facility] OR;  Service: Orthopedics;  Laterality: Left;  STERIOD INJECTION Right 02/14/2015   Procedure: RIGHT SHOULDER CORTISONE INJECTION;  Surgeon: Loreta Ave, MD;  Location: [facility] OR;  Service: Orthopedics;  Laterality: Right;   TOTAL HIP ARTHROPLASTY Left 07/27/2013   Procedure: TOTAL HIP ARTHROPLASTY ANTERIOR APPROACH;  Surgeon: Loreta Ave, MD;  Location: [facility] OR;  Service: Orthopedics;  Laterality: Left;   TOTAL SHOULDER ARTHROPLASTY Left 02/14/2015   Procedure: LEFT TOTAL SHOULDER ARTHROPLASTY;  Surgeon: Loreta Ave, MD;  Location: [facility] OR;  Service: Orthopedics;  Laterality: Left;   TOTAL SHOULDER REVISION Left 02/09/2019   Procedure: TOTAL SHOULDER REVISION;  Surgeon: Bjorn Pippin, MD;  Location: [facility];  Service: Orthopedics;  Laterality: Left;    Allergies  Allergen Reactions   Abaloparatide Other (See Comments)    "Burred vision, lightheaded" (patient does not recall this in 2024)   Mobic [Meloxicam] Hypertension   Crestor [Rosuvastatin] Other (See Comments)    Muscle aches   Norvasc [Amlodipine Besylate] Swelling and Other (See Comments)    Ankle swelling   Simvastatin Other (See Comments)    Muscle aches, high LFTs   Sulfa Antibiotics Rash   Zetia [Ezetimibe] Other (See Comments)    Muscle aches    Outpatient Encounter Medications as of 06/03/2023  Medication Sig   acetaminophen (TYLENOL) 325 MG tablet Take 650 mg by mouth every 4 (four) hours as needed for moderate pain (pain score 4-6).   allopurinol (ZYLOPRIM) 300 MG tablet Take 300 mg by mouth daily.   atorvastatin (LIPITOR) 10 MG tablet Take 10 mg by mouth daily.   cephALEXin (KEFLEX) 250 MG capsule Take 250 mg by mouth at bedtime.   cloNIDine (CATAPRES) 0.1 MG tablet Take 0.1 mg by mouth daily as needed (for hypertension- if systolic b/p is over 160).   cyanocobalamin 1000 MCG tablet Take 1,000 mcg by mouth daily.   diclofenac Sodium (VOLTAREN) 1 % GEL Apply 2-4 g topically 4 (four) times daily.   docusate sodium (COLACE) 100 MG capsule Take 100 mg by mouth 2 (two) times daily.   famotidine (PEPCID) 20 MG tablet Take 1 tablet (20 mg total) by mouth 2 (two) times daily.   ferrous sulfate 220 (44 Fe) MG/5ML solution Take 7.5 mLs by mouth 3 (three) times a week. Monday, Wednesday, Friday   hydrALAZINE (APRESOLINE) 100 MG tablet Take 1 tablet (100 mg total) by mouth 3 (three) times daily.   pantoprazole (PROTONIX) 40 MG tablet Take 1 tablet (40 mg total) by mouth 2 (two) times daily.   polyethylene glycol powder (MIRALAX) 17  GM/SCOOP powder Take 17 g by mouth daily. Give 17 gram by mouth in the morning for constipation mix in 8 oz water.   Rivaroxaban (XARELTO) 15 MG TABS tablet Take 1 tablet (15 mg total) by mouth daily with supper.   senna-docusate (SENOKOT-S) 8.6-50 MG tablet Take 1 tablet by mouth 2 (two) times daily.   sucralfate (CARAFATE) 1 GM/10ML suspension Take 10 mLs (1 g total) by mouth 4 (four) times daily -  with meals and at bedtime. Give  1 hour before meals and at bedtime on an empt stomach   tamsulosin (FLOMAX) 0.4 MG CAPS capsule Take 1 capsule (0.4 mg total) by mouth daily.   Zinc Oxide 10 % OINT Apply 1 Application topically See admin instructions. 1 application to buttocks every shift for redness   meclizine (ANTIVERT) 12.5 MG tablet Take 1 tablet (12.5 mg total) by mouth 3 (three) times daily as needed for dizziness.   No facility-administered  encounter medications on file as of 06/03/2023.    Review of Systems  Constitutional:  Negative for appetite change, chills, fatigue and fever.  HENT:  Negative for congestion, hearing loss, rhinorrhea and sore throat.   Eyes: Negative.   Respiratory:  Negative for cough, shortness of breath and wheezing.   Cardiovascular:  Negative for chest pain, palpitations and leg swelling.  Gastrointestinal:  Negative for abdominal pain, constipation, diarrhea, nausea and vomiting.  Genitourinary:  Negative for dysuria.  Musculoskeletal:  Negative for arthralgias, back pain and myalgias.  Skin:  Negative for color change, rash and wound.  Neurological:  Negative for dizziness, weakness and headaches.  Psychiatric/Behavioral:  Negative for behavioral problems. The patient is not nervous/anxious.     Immunization History  Administered Date(s) Administered   Influenza Split 11/24/2008   Influenza, High Dose Seasonal PF 03/16/2014, 01/14/2016, 01/21/2017, 02/24/2022   Influenza,inj,Quad PF,6+ Mos 11/20/2010, 02/01/2013   Influenza-Unspecified 11/20/2010,  02/01/2013, 02/19/2018, 02/24/2022   Moderna Sars-Covid-2 Vaccination 04/23/2019, 05/21/2019, 02/20/2020   Pneumococcal Conjugate-13 03/16/2014   Pneumococcal Polysaccharide-23 04/28/2005   Td 04/28/2005   Td (Adult) 04/28/2005   Tdap 06/08/2017   Zoster Recombinant(Shingrix) 01/14/2019   Zoster, Live 07/21/2007   Pertinent  Health Maintenance Due  Topic Date Due   OPHTHALMOLOGY EXAM  Never done   HEMOGLOBIN A1C  04/15/2023   FOOT EXAM  02/20/2024   DEXA SCAN  Completed   INFLUENZA VACCINE  Discontinued      04/12/2019    2:21 PM 07/19/2019    2:20 PM 09/28/2019    8:51 AM 06/21/2020    5:59 PM 09/23/2021    1:10 PM  Fall Risk  Falls in the past year? 0 0     (RETIRED) Patient Fall Risk Level Low fall risk Low fall risk Moderate fall risk Low fall risk Low fall risk  Fall risk Follow up Falls evaluation completed Falls evaluation completed      Functional Status Survey:    Vitals:   06/03/23 0957 06/03/23 1024  BP: (!) 158/67 117/65  Pulse: 67   Resp: 16   Temp: (!) 97.5 F (36.4 C)   SpO2: 99%   Weight: 114 lb 12.8 oz (52.1 kg)   Height: 5\' 5"  (1.651 m)    Body mass index is 19.1 kg/m. Physical Exam Constitutional:      Appearance: Normal appearance.  HENT:     Head: Normocephalic and atraumatic.     Nose: Nose normal.     Mouth/Throat:     Mouth: Mucous membranes are moist.  Eyes:     Conjunctiva/sclera: Conjunctivae normal.  Cardiovascular:     Rate and Rhythm: Normal rate and regular rhythm.  Pulmonary:     Effort: Pulmonary effort is normal.     Breath sounds: Normal breath sounds.  Abdominal:     General: Bowel sounds are normal.     Palpations: Abdomen is soft.  Musculoskeletal:     Cervical back: Normal range of motion.  Skin:    General: Skin is warm and dry.  Neurological:     Mental Status: She is alert.  Psychiatric:        Mood and Affect: Mood normal.        Behavior: Behavior normal.     Labs reviewed: Recent Labs     01/06/23 0926 01/07/23 0425 01/08/23 0413 01/16/23 0000 05/24/23 0701 05/25/23 0603 05/26/23 0540 06/02/23 0000  NA 134* 134* 135   < > 133* 132* 133* 137  K  3.6 3.6 3.1*   < > 3.7 3.5 3.3* 3.0*  CL 106 106 106   < > 103 104 106 107  CO2 22 19* 22   < > 21* 22 20* 24*  GLUCOSE 115* 89 95   < > 96 104* 93  --   BUN 14 13 13    < > 12 10 10 14   CREATININE 0.62 0.65 0.61   < > 0.80 0.79 0.67 0.6  CALCIUM 9.9 9.2 9.1   < > 9.7 9.2 9.3 9.8  MG 1.9 2.2 2.4   < > 1.4* 2.1 1.7  --   PHOS 2.0* 2.6 2.2*  --   --   --   --   --    < > = values in this interval not displayed.   Recent Labs    12/28/22 1256 01/01/23 0416 01/08/23 0413 01/16/23 0000 01/19/23 0000 03/03/23 0000 05/22/23 1116  AST 17  --  17   < > 10* 12* 16  ALT 14  --  11   < > 3* 5* 7  ALKPHOS 85  --  64   < > 79 76 77  BILITOT 0.8  --  0.6  --   --   --  0.8  PROT 6.5  --  5.6*  --   --   --  6.1*  ALBUMIN 3.0*   < > 2.7*   < > 2.6* 3.2* 2.8*   < > = values in this interval not displayed.   Recent Labs    03/03/23 0000 05/22/23 1116 05/23/23 0622 05/24/23 0701 05/25/23 0603 05/26/23 0540 06/02/23 0000  WBC 5.2 8.8   < > 7.6 6.7 6.8 4.8  NEUTROABS 3,474.00 6.3  --   --   --   --  2,568.00  HGB 10.0* 10.7*   < > 10.7* 9.6* 9.2* 9.6*  HCT 31* 34.2*   < > 33.2* 29.4* 28.8* 30*  MCV  --  86.8   < > 84.3 83.1 84.7  --   PLT 258 211   < > 206 183 181 211   < > = values in this interval not displayed.   Lab Results  Component Value Date   TSH 1.438 05/22/2023   Lab Results  Component Value Date   HGBA1C 6.0 (H) 10/13/2022   Lab Results  Component Value Date   CHOL 121 10/13/2022   HDL 44 10/13/2022   LDLCALC 62 10/13/2022   TRIG 74 10/13/2022   CHOLHDL 2.8 10/13/2022    Significant Diagnostic Results in last 30 days:  MR BRAIN WO CONTRAST Result Date: 05/22/2023 CLINICAL DATA:  Mental status change EXAM: MRI HEAD WITHOUT CONTRAST TECHNIQUE: Multiplanar, multiecho pulse sequences of the brain and  surrounding structures were obtained without intravenous contrast. COMPARISON:  Same-day head CT. FINDINGS: Brain: No acute infarct. No evidence of intracranial hemorrhage. Scattered and confluent FLAIR hyperintensity in the periventricular and subcortical white matter suggestive of moderate chronic microvascular ischemic changes. Additional FLAIR signal abnormality in the pons likely related to the same. Remote lacunar infarcts in the bilateral basal ganglia and right corona radiata. No mass lesion or midline shift. Cerebellum is unremarkable. Normal appearance of midline structures. The basilar cisterns are patent. No extra-axial fluid collections. Ventricles: Prominence of the lateral ventricles suggestive of underlying parenchymal volume loss. Vascular: Skull base flow voids are visualized. Skull and upper cervical spine: No focal abnormality. Sinuses/Orbits: Orbits are symmetric. Paranasal sinuses are clear. Other: Mastoid air cells are  clear. IMPRESSION: No acute intracranial abnormality. Moderate chronic microvascular ischemic changes. Remote lacunar infarcts in the right corona radiata and bilateral basal ganglia. Mild generalized parenchymal volume loss. Electronically Signed   By: Emily Filbert M.D.   On: 05/22/2023 19:20   DG Chest Port 1 View Result Date: 05/22/2023 CLINICAL DATA:  83 year old female with questionable sepsis EXAM: PORTABLE CHEST 1 VIEW COMPARISON:  12/22/2022 FINDINGS: Cardiomediastinal silhouette unchanged in size and contour. No evidence of central vascular congestion. No interlobular septal thickening. Low lung volumes. Opacity at the left lung base obscuring the left hemidiaphragm and the left heart border. No pneumothorax No acute displaced fracture. Surgical changes of the shoulders incompletely imaged IMPRESSION: Low lung volumes with opacity at the left lung base, potentially pneumonia/consolidation or atelectasis. Electronically Signed   By: Gilmer Mor D.O.   On:  05/22/2023 13:55   CT Head Wo Contrast Result Date: 05/22/2023 CLINICAL DATA:  Mental status change, altered mental status. EXAM: CT HEAD WITHOUT CONTRAST TECHNIQUE: Contiguous axial images were obtained from the base of the skull through the vertex without intravenous contrast. RADIATION DOSE REDUCTION: This exam was performed according to the departmental dose-optimization program which includes automated exposure control, adjustment of the mA and/or kV according to patient size and/or use of iterative reconstruction technique. COMPARISON:  MRI head 12/28/2022, CT head 10/12/2022 FINDINGS: Brain: No acute intracranial hemorrhage. No CT evidence of acute infarct. Nonspecific hypoattenuation in the periventricular and subcortical white matter favored to reflect chronic microvascular ischemic changes. Remote lacunar infarct in the right corona radiata. No edema, mass effect, or midline shift. The basilar cisterns are patent. Ventricles: The ventricles are normal. Vascular: Atherosclerotic calcifications of the carotid siphons and intracranial vertebral arteries. No hyperdense vessel. Skull: No acute or aggressive finding. Chronic appearing bilateral nasal bone deformities. Orbits: Orbits are symmetric. Sinuses: Mild mucosal thickening in the ethmoid sinuses. Other: Mastoid air cells are clear. IMPRESSION: No CT evidence of acute intracranial abnormality. Moderate chronic microvascular ischemic changes. Small remote infarct in the right corona radiata. Electronically Signed   By: Emily Filbert M.D.   On: 05/22/2023 12:48   DG ESOPHAGUS W SINGLE CM (SOL OR THIN BA) Result Date: 05/19/2023 CLINICAL DATA:  Reflux esophagitis. EXAM: ESOPHOGRAM/BARIUM SWALLOW TECHNIQUE: Single contrast examination was performed using thin barium. FLUOROSCOPY: Radiation Exposure Index (as provided by the fluoroscopic device): 5.2 mGy Kerma COMPARISON:  None Available. FINDINGS: The examination was severely limited due to limited  patient mobility. The patient was unable to stand or get into a near prone/RAO position. The examination was therefore performed with the patient recumbent in the RPO and supine positions. The pharynx and cervical esophagus were not well evaluated. The thoracic esophagus appears normal in caliber without evidence of a gross mass or stricture. No substantial esophageal dysmotility was seen for age. There is a small sliding hiatal hernia. No gastroesophageal reflux was observed. A barium tablet was not administered. IMPRESSION: 1. Severely limited examination as detailed above. 2. Small sliding hiatal hernia. No gastroesophageal reflux was observed during this examination. 3. No evidence of an esophageal stricture. Electronically Signed   By: Sebastian Ache M.D.   On: 05/19/2023 15:59    Assessment/Plan  1. Hypokalemia (Primary) -  K 3.0 -  will start on KCL ER 20 mew give 2 tabs = 40 meq PO X 1 -  BMP after  2. Essential hypertension -  BP stable -  continue current regimen  3. Atrial fibrillation, unspecified type (HCC) -  rate-controlled -  continue Xarelto for anticoagulation   Family/ staff Communication:   Discussed plan of care with resident and charge nurse.  Labs/tests ordered:  BMP

## 2023-06-05 ENCOUNTER — Encounter: Payer: Self-pay | Admitting: Sports Medicine

## 2023-06-05 ENCOUNTER — Non-Acute Institutional Stay (SKILLED_NURSING_FACILITY): Admitting: Sports Medicine

## 2023-06-05 DIAGNOSIS — I4891 Unspecified atrial fibrillation: Secondary | ICD-10-CM

## 2023-06-05 DIAGNOSIS — N390 Urinary tract infection, site not specified: Secondary | ICD-10-CM

## 2023-06-05 DIAGNOSIS — K219 Gastro-esophageal reflux disease without esophagitis: Secondary | ICD-10-CM

## 2023-06-05 DIAGNOSIS — E782 Mixed hyperlipidemia: Secondary | ICD-10-CM

## 2023-06-05 DIAGNOSIS — I10 Essential (primary) hypertension: Secondary | ICD-10-CM | POA: Diagnosis not present

## 2023-06-05 NOTE — Progress Notes (Signed)
 Provider:  Dr. Venita Sheffield Location:  [facility] Place of Service:   Skilled care   PCP: Sigmund Hazel, MD Patient Care Team: Sigmund Hazel, MD as PCP - General (Family Medicine) Tina Lerner, DO as PCP - Cardiology (Cardiology) Suzi Roots as Physician Assistant (Dermatology)  Extended Emergency Contact Information Primary Emergency Contact: Frank, Tina, [place] [address] [place] Home Phone: [phone] Work Phone: [phone] Mobile Phone: [phone] Relation: Son Secondary Emergency Contact: Frank,Tina  [place] Phone: [phone] Relation: Son  Goals of care:  Advanced Directive information    05/22/2023   11:08 AM  Advanced Directives  Does Patient Have a Medical Advance Directive? No  Would patient like information on creating a medical advance directive? No - Guardian declined     Allergies  Allergen Reactions   Abaloparatide Other (See Comments)    "Burred vision, lightheaded" (patient does not recall this in 2024)   Mobic [Meloxicam] Hypertension   Crestor [Rosuvastatin] Other (See Comments)    Muscle aches   Norvasc [Amlodipine Besylate] Swelling and Other (See Comments)    Ankle swelling   Simvastatin Other (See Comments)    Muscle aches, high LFTs   Sulfa Antibiotics Rash   Zetia [Ezetimibe] Other (See Comments)    Muscle aches      History of Present Illness         83 y.o. female with medical history significant of GERD, DM 2, diastolic CHF, paroxysmal atrial fibrillation on Xarelto, HLD, HTN, vertigo, osteoarthritis is seen today for discharge  Pt was admitted to Skilled nursing after her recently hospitalization for acute encephalopathy from  3/14 - 3/18  CT head, MRI brain are negative for any acute pathology but does show chronic changes. Metabolic workup is also negative during her hospital stay   Pt saw urology who recommended low dose antibiotic for  prophylaxis.    Past Medical History:  Diagnosis Date   CKD (chronic kidney disease), stage II    nephrologist-- dr Kathrene Bongo  [facility])   Diabetes mellitus type 2, diet-controlled (HCC)    followed by pcp---   (09-20-2019  per pt does not check blood sugar at home)   Fracture of humeral shaft, right, closed 04/25/2022   GERD (gastroesophageal reflux disease)    History of primary hyperparathyroidism    s/p  parathyroidectomy right inferior on 12-02-2012   Humerus fracture 04/25/2022   Hyperlipidemia    Hypertension    followd by nephrologist----  (09-20-2019  per pt had stress test done some time ago , unsure when/ where, thinks told ok)   OA (osteoarthritis)    Osteoporosis    Prosthetic joint infection (HCC)    followed by dr j. hatcher (ID)   left total shoulder arthroplasty 12/ 2016  ; 12/ 2020  revision with explant joint replacement and hemiarthroplasty;  completed 6 month antibiotic 05/ 2021   Vertigo     Past Surgical History:  Procedure Laterality Date   COLONOSCOPY     DILATATION & CURETTAGE/HYSTEROSCOPY WITH MYOSURE N/A 09/28/2019   Procedure: DILATATION & CURETTAGE/HYSTEROSCOPY WITH MYOSURE;  Surgeon: Olivia Mackie, MD;  Location: [facility];  Service: Gynecology;  Laterality: N/A;   EYE SURGERY  yrs ago   laser surgery for retinal tear.  (unilateral , pt unsure which side)   ORIF HUMERUS FRACTURE Right 04/27/2022   Procedure: OPEN REDUCTION INTERNAL FIXATION (ORIF) PROXIMAL HUMERUS FRACTURE;  Surgeon: Bjorn Pippin, MD;  Location: [facility];  Service: Orthopedics;  Laterality: Right;   PARATHYROIDECTOMY N/A 12/02/2012   Procedure: PARATHYROIDECTOMY with frozen section ;  Surgeon: Velora Heckler, MD;  Location: [facility];  Service: General;  Laterality: N/A;   REVERSE SHOULDER ARTHROPLASTY Right 04/27/2022   Procedure: REVERSE SHOULDER ARTHROPLASTY;  Surgeon: Bjorn Pippin, MD;  Location: [facility];  Service: Orthopedics;  Laterality: Right;   SHOULDER  INJECTION Left 07/27/2013   Procedure: SHOULDER INJECTION;  Surgeon: Loreta Ave, MD;  Location: [facility] OR;  Service: Orthopedics;  Laterality: Left;   STERIOD INJECTION Right 02/14/2015   Procedure: RIGHT SHOULDER CORTISONE INJECTION;  Surgeon: Loreta Ave, MD;  Location: [facility] OR;  Service: Orthopedics;  Laterality: Right;   TOTAL HIP ARTHROPLASTY Left 07/27/2013   Procedure: TOTAL HIP ARTHROPLASTY ANTERIOR APPROACH;  Surgeon: Loreta Ave, MD;  [facility] OR;  Service: Orthopedics;  Laterality: Left;   TOTAL SHOULDER ARTHROPLASTY Left 02/14/2015   Procedure: LEFT TOTAL SHOULDER ARTHROPLASTY;  Surgeon: Loreta Ave, MD;  Location: [facility] OR;  Service: Orthopedics;  Laterality: Left;   TOTAL SHOULDER REVISION Left 02/09/2019   Procedure: TOTAL SHOULDER REVISION;  Surgeon: Bjorn Pippin, MD;  Location: [facility];  Service: Orthopedics;  Laterality: Left;      reports that she quit smoking about 35 years ago. Her smoking use included cigarettes. She started smoking about 55 years ago. She has a 20 pack-year smoking history. She has never used smokeless tobacco. She reports that she does not drink alcohol and does not use drugs. Social History   Socioeconomic History   Marital status: Widowed    Spouse name: Not on file   Number of children: Not on file   Years of education: Not on file   Highest education level: Not on file  Occupational History   Not on file  Tobacco Use   Smoking status: Former    Current packs/day: 0.00    Average packs/day: 1 pack/day for 20.0 years (20.0 ttl pk-yrs)    Types: Cigarettes    Start date: 10/28/1967    Quit date: 10/28/1987    Years since quitting: 35.6   Smokeless tobacco: Never  Vaping Use   Vaping status: Never Used  Substance and Sexual Activity   Alcohol use: No   Drug use: Never   Sexual activity: Yes    Birth control/protection: Post-menopausal  Other Topics Concern   Not on file  Social History Narrative   Not on file   Social Drivers  of Health   Financial Resource Strain: Not on file  Food Insecurity: No Food Insecurity (05/22/2023)   Hunger Vital Sign    Worried About Running Out of Food in the Last Year: Never true    Ran Out of Food in the Last Year: Never true  Transportation Needs: No Transportation Needs (05/22/2023)   PRAPARE - Administrator, Civil Service (Medical): No    Lack of Transportation (Non-Medical): No  Physical Activity: Not on file  Stress: Not on file  Social Connections: Unknown (05/22/2023)   Social Connection and Isolation Panel [NHANES]    Frequency of Communication with Friends and Family: Patient unable to answer    Frequency of Social Gatherings with Friends and Family: Patient unable to answer    Attends Religious Services: Patient unable to answer    Active Member of Clubs or Organizations: Patient unable to answer    Attends Banker Meetings: Patient  unable to answer    Marital Status: Widowed  Intimate Partner Violence: Not At Risk (05/22/2023)   Humiliation, Afraid, Rape, and Kick questionnaire    Fear of Current or Ex-Partner: No    Emotionally Abused: No    Physically Abused: No    Sexually Abused: No   Functional Status Survey:    Allergies  Allergen Reactions   Abaloparatide Other (See Comments)    "Burred vision, lightheaded" (patient does not recall this in 2024)   Mobic [Meloxicam] Hypertension   Crestor [Rosuvastatin] Other (See Comments)    Muscle aches   Norvasc [Amlodipine Besylate] Swelling and Other (See Comments)    Ankle swelling   Simvastatin Other (See Comments)    Muscle aches, high LFTs   Sulfa Antibiotics Rash   Zetia [Ezetimibe] Other (See Comments)    Muscle aches    Pertinent  Health Maintenance Due  Topic Date Due   OPHTHALMOLOGY EXAM  Never done   HEMOGLOBIN A1C  04/15/2023   FOOT EXAM  02/20/2024   DEXA SCAN  Completed   INFLUENZA VACCINE  Discontinued    Medications: Outpatient Encounter Medications as of  06/05/2023  Medication Sig   acetaminophen (TYLENOL) 325 MG tablet Take 650 mg by mouth every 4 (four) hours as needed for moderate pain (pain score 4-6).   allopurinol (ZYLOPRIM) 300 MG tablet Take 300 mg by mouth daily.   atorvastatin (LIPITOR) 10 MG tablet Take 10 mg by mouth daily.   cephALEXin (KEFLEX) 250 MG capsule Take 250 mg by mouth at bedtime.   cloNIDine (CATAPRES) 0.1 MG tablet Take 0.1 mg by mouth daily as needed (for hypertension- if systolic b/p is over 160).   cyanocobalamin 1000 MCG tablet Take 1,000 mcg by mouth daily.   diclofenac Sodium (VOLTAREN) 1 % GEL Apply 2-4 g topically 4 (four) times daily.   docusate sodium (COLACE) 100 MG capsule Take 100 mg by mouth 2 (two) times daily.   famotidine (PEPCID) 20 MG tablet Take 1 tablet (20 mg total) by mouth 2 (two) times daily.   ferrous sulfate 220 (44 Fe) MG/5ML solution Take 7.5 mLs by mouth 3 (three) times a week. Monday, Wednesday, Friday   hydrALAZINE (APRESOLINE) 100 MG tablet Take 1 tablet (100 mg total) by mouth 3 (three) times daily.   meclizine (ANTIVERT) 12.5 MG tablet Take 1 tablet (12.5 mg total) by mouth 3 (three) times daily as needed for dizziness.   pantoprazole (PROTONIX) 40 MG tablet Take 1 tablet (40 mg total) by mouth 2 (two) times daily.   polyethylene glycol powder (MIRALAX) 17 GM/SCOOP powder Take 17 g by mouth daily. Give 17 gram by mouth in the morning for constipation mix in 8 oz water.   Rivaroxaban (XARELTO) 15 MG TABS tablet Take 1 tablet (15 mg total) by mouth daily with supper.   senna-docusate (SENOKOT-S) 8.6-50 MG tablet Take 1 tablet by mouth 2 (two) times daily.   sucralfate (CARAFATE) 1 GM/10ML suspension Take 10 mLs (1 g total) by mouth 4 (four) times daily -  with meals and at bedtime. Give  1 hour before meals and at bedtime on an empt stomach   tamsulosin (FLOMAX) 0.4 MG CAPS capsule Take 1 capsule (0.4 mg total) by mouth daily.   Zinc Oxide 10 % OINT Apply 1 Application topically See admin  instructions. 1 application to buttocks every shift for redness   No facility-administered encounter medications on file as of 06/05/2023.    Review of Systems  Constitutional:  Negative for chills and fever.  HENT:  Negative for sinus pressure and sore throat.   Respiratory:  Negative for cough, shortness of breath and wheezing.   Cardiovascular:  Negative for chest pain, palpitations and leg swelling.  Gastrointestinal:  Negative for abdominal distention, abdominal pain, blood in stool, constipation, diarrhea, nausea and vomiting.  Genitourinary:  Negative for dysuria.  Neurological:  Negative for dizziness.  Psychiatric/Behavioral:  Negative for confusion.     There were no vitals filed for this visit. There is no height or weight on file to calculate BMI. Physical Exam Constitutional:      Appearance: Normal appearance.  HENT:     Head: Normocephalic and atraumatic.  Cardiovascular:     Rate and Rhythm: Normal rate and regular rhythm.  Pulmonary:     Effort: Pulmonary effort is normal. No respiratory distress.     Breath sounds: Normal breath sounds. No wheezing.  Abdominal:     General: Bowel sounds are normal. There is no distension.     Tenderness: There is no abdominal tenderness. There is no guarding or rebound.     Comments:    Neurological:     Mental Status: She is alert. Mental status is at baseline.     Labs reviewed: Basic Metabolic Panel: Recent Labs    01/06/23 0926 01/07/23 0425 01/08/23 0413 01/16/23 0000 05/24/23 0701 05/25/23 0603 05/26/23 0540 06/02/23 0000  NA 134* 134* 135   < > 133* 132* 133* 137  K 3.6 3.6 3.1*   < > 3.7 3.5 3.3* 3.0*  CL 106 106 106   < > 103 104 106 107  CO2 22 19* 22   < > 21* 22 20* 24*  GLUCOSE 115* 89 95   < > 96 104* 93  --   BUN 14 13 13    < > 12 10 10 14   CREATININE 0.62 0.65 0.61   < > 0.80 0.79 0.67 0.6  CALCIUM 9.9 9.2 9.1   < > 9.7 9.2 9.3 9.8  MG 1.9 2.2 2.4   < > 1.4* 2.1 1.7  --   PHOS 2.0* 2.6 2.2*   --   --   --   --   --    < > = values in this interval not displayed.   Liver Function Tests: Recent Labs    12/28/22 1256 01/01/23 0416 01/08/23 0413 01/16/23 0000 01/19/23 0000 03/03/23 0000 05/22/23 1116  AST 17  --  17   < > 10* 12* 16  ALT 14  --  11   < > 3* 5* 7  ALKPHOS 85  --  64   < > 79 76 77  BILITOT 0.8  --  0.6  --   --   --  0.8  PROT 6.5  --  5.6*  --   --   --  6.1*  ALBUMIN 3.0*   < > 2.7*   < > 2.6* 3.2* 2.8*   < > = values in this interval not displayed.   Recent Labs    10/12/22 2013 05/22/23 1450  LIPASE 26 28   Recent Labs    10/12/22 2104 12/28/22 1256 05/22/23 1450  AMMONIA <10 <10 13   CBC: Recent Labs    03/03/23 0000 05/22/23 1116 05/23/23 0622 05/24/23 0701 05/25/23 0603 05/26/23 0540 06/02/23 0000  WBC 5.2 8.8   < > 7.6 6.7 6.8 4.8  NEUTROABS 3,474.00 6.3  --   --   --   --  2,568.00  HGB 10.0* 10.7*   < > 10.7* 9.6* 9.2* 9.6*  HCT 31* 34.2*   < > 33.2* 29.4* 28.8* 30*  MCV  --  86.8   < > 84.3 83.1 84.7  --   PLT 258 211   < > 206 183 181 211   < > = values in this interval not displayed.   Cardiac Enzymes: No results for input(s): "CKTOTAL", "CKMB", "CKMBINDEX", "TROPONINI" in the last 8760 hours. BNP: Invalid input(s): "POCBNP" CBG: Recent Labs    05/25/23 1958 05/26/23 0736 05/26/23 1150  GLUCAP 119* 97 154*    Procedures and Imaging Studies During Stay: MR BRAIN WO CONTRAST Result Date: 05/22/2023 CLINICAL DATA:  Mental status change EXAM: MRI HEAD WITHOUT CONTRAST TECHNIQUE: Multiplanar, multiecho pulse sequences of the brain and surrounding structures were obtained without intravenous contrast. COMPARISON:  Same-day head CT. FINDINGS: Brain: No acute infarct. No evidence of intracranial hemorrhage. Scattered and confluent FLAIR hyperintensity in the periventricular and subcortical white matter suggestive of moderate chronic microvascular ischemic changes. Additional FLAIR signal abnormality in the pons likely  related to the same. Remote lacunar infarcts in the bilateral basal ganglia and right corona radiata. No mass lesion or midline shift. Cerebellum is unremarkable. Normal appearance of midline structures. The basilar cisterns are patent. No extra-axial fluid collections. Ventricles: Prominence of the lateral ventricles suggestive of underlying parenchymal volume loss. Vascular: Skull base flow voids are visualized. Skull and upper cervical spine: No focal abnormality. Sinuses/Orbits: Orbits are symmetric. Paranasal sinuses are clear. Other: Mastoid air cells are clear. IMPRESSION: No acute intracranial abnormality. Moderate chronic microvascular ischemic changes. Remote lacunar infarcts in the right corona radiata and bilateral basal ganglia. Mild generalized parenchymal volume loss. Electronically Signed   By: Emily Filbert M.D.   On: 05/22/2023 19:20   DG Chest Port 1 View Result Date: 05/22/2023 CLINICAL DATA:  83 year old female with questionable sepsis EXAM: PORTABLE CHEST 1 VIEW COMPARISON:  12/22/2022 FINDINGS: Cardiomediastinal silhouette unchanged in size and contour. No evidence of central vascular congestion. No interlobular septal thickening. Low lung volumes. Opacity at the left lung base obscuring the left hemidiaphragm and the left heart border. No pneumothorax No acute displaced fracture. Surgical changes of the shoulders incompletely imaged IMPRESSION: Low lung volumes with opacity at the left lung base, potentially pneumonia/consolidation or atelectasis. Electronically Signed   By: Gilmer Mor D.O.   On: 05/22/2023 13:55   CT Head Wo Contrast Result Date: 05/22/2023 CLINICAL DATA:  Mental status change, altered mental status. EXAM: CT HEAD WITHOUT CONTRAST TECHNIQUE: Contiguous axial images were obtained from the base of the skull through the vertex without intravenous contrast. RADIATION DOSE REDUCTION: This exam was performed according to the departmental dose-optimization program which  includes automated exposure control, adjustment of the mA and/or kV according to patient size and/or use of iterative reconstruction technique. COMPARISON:  MRI head 12/28/2022, CT head 10/12/2022 FINDINGS: Brain: No acute intracranial hemorrhage. No CT evidence of acute infarct. Nonspecific hypoattenuation in the periventricular and subcortical white matter favored to reflect chronic microvascular ischemic changes. Remote lacunar infarct in the right corona radiata. No edema, mass effect, or midline shift. The basilar cisterns are patent. Ventricles: The ventricles are normal. Vascular: Atherosclerotic calcifications of the carotid siphons and intracranial vertebral arteries. No hyperdense vessel. Skull: No acute or aggressive finding. Chronic appearing bilateral nasal bone deformities. Orbits: Orbits are symmetric. Sinuses: Mild mucosal thickening in the ethmoid sinuses. Other: Mastoid air cells are clear. IMPRESSION: No CT evidence of acute  intracranial abnormality. Moderate chronic microvascular ischemic changes. Small remote infarct in the right corona radiata. Electronically Signed   By: Emily Filbert M.D.   On: 05/22/2023 12:48   DG ESOPHAGUS W SINGLE CM (SOL OR THIN BA) Result Date: 05/19/2023 CLINICAL DATA:  Reflux esophagitis. EXAM: ESOPHOGRAM/BARIUM SWALLOW TECHNIQUE: Single contrast examination was performed using thin barium. FLUOROSCOPY: Radiation Exposure Index (as provided by the fluoroscopic device): 5.2 mGy Kerma COMPARISON:  None Available. FINDINGS: The examination was severely limited due to limited patient mobility. The patient was unable to stand or get into a near prone/RAO position. The examination was therefore performed with the patient recumbent in the RPO and supine positions. The pharynx and cervical esophagus were not well evaluated. The thoracic esophagus appears normal in caliber without evidence of a gross mass or stricture. No substantial esophageal dysmotility was seen for age.  There is a small sliding hiatal hernia. No gastroesophageal reflux was observed. A barium tablet was not administered. IMPRESSION: 1. Severely limited examination as detailed above. 2. Small sliding hiatal hernia. No gastroesophageal reflux was observed during this examination. 3. No evidence of an esophageal stricture. Electronically Signed   By: Sebastian Ache M.D.   On: 05/19/2023 15:59    Assessment and Plan          1. Essential hypertension (Primary) 2. Atrial fibrillation, unspecified type (HCC) 3. Gastroesophageal reflux disease, unspecified whether esophagitis present 4. Recurrent UTI 5. Mixed hyperlipidemia   Patient has been advised to f/u with their PCP in 1-2 weeks to for a transitions of care visit.    30 min  Total time spent for obtaining history,  performing a medically appropriate examination and evaluation, reviewing the tests, preparing discharge planning.

## 2023-06-08 DIAGNOSIS — E876 Hypokalemia: Secondary | ICD-10-CM | POA: Diagnosis not present

## 2023-06-09 ENCOUNTER — Encounter: Payer: Self-pay | Admitting: Adult Health

## 2023-06-09 ENCOUNTER — Non-Acute Institutional Stay: Payer: Self-pay | Admitting: Adult Health

## 2023-06-09 DIAGNOSIS — I4891 Unspecified atrial fibrillation: Secondary | ICD-10-CM

## 2023-06-09 DIAGNOSIS — R339 Retention of urine, unspecified: Secondary | ICD-10-CM | POA: Diagnosis not present

## 2023-06-09 DIAGNOSIS — E876 Hypokalemia: Secondary | ICD-10-CM | POA: Diagnosis not present

## 2023-06-09 DIAGNOSIS — I10 Essential (primary) hypertension: Secondary | ICD-10-CM

## 2023-06-09 DIAGNOSIS — K219 Gastro-esophageal reflux disease without esophagitis: Secondary | ICD-10-CM | POA: Diagnosis not present

## 2023-06-09 MED ORDER — POTASSIUM CHLORIDE CRYS ER 10 MEQ PO TBCR: 10.0000 meq | EXTENDED_RELEASE_TABLET | Freq: Every day | ORAL | Status: AC

## 2023-06-09 NOTE — Progress Notes (Signed)
 Location:  [facility] Room Number: 801 A Place of Service:  ALF [phone]) Provider:  Kenard Gower, DNP, FNP-BC  Patient Care Team: Sigmund Hazel, MD as PCP - General (Family Medicine) Tessa Lerner, DO as PCP - Cardiology (Cardiology) Suzi Roots as Physician Assistant (Dermatology)  Extended Emergency Contact Information Primary Emergency Contact: Francia, [place], [place] [address] [place] of [place] Home Phone: [phone] Work Phone: [phone] Mobile Phone: [phone] Relation: Son Secondary Emergency Contact: Teodoro,[name]  [place] Phone: [phone] Relation: Son  Code Status:   DNR  Goals of care: Advanced Directive information    05/22/2023   11:08 AM  Advanced Directives  Does Patient Have a Medical Advance Directive? No  Would patient like information on creating a medical advance directive? No - Guardian declined     Chief Complaint  Patient presents with   Acute Visit    Low potassium    HPI:  Pt is a 83 y.o. female seen today for an acute visit regarding low K+.    She is a resident of [facility] assisted living facility and presents with hypokalemia. Her potassium level on June 02, 2023, was 3.0 mEq/L, for which she was given 40 mEq of KCL ER. A repeat lab after supplementation  on 06/04/23 showed an increase to  K 3.2 mEq/L. However, a subsequent lab on June 08, 2023, showed a potassium level of 3.0 mEq/L again. No diarrhea or frequent urination, and she is not on any diuretics.  She has a history of hypertension, managed with hydralazine 100 mg three times a day and clonidine 0.1 mg daily as needed. Her blood pressure has been stable, with the latest reading being 130/78 mmHg.  She also has GERD and takes sucralfate 1 gram/10 mL, 10 mL before meals and at bedtime, and Protonix delayed release 40 mg twice a day. No episodes of heartburn.  For urinary  retention, she takes Flomax 0.4 mg daily and has no episodes of urinary retention.  She has atrial fibrillation and takes rivaroxaban 15 mg in the evening for anticoagulation.   Past Medical History:  Diagnosis Date   CKD (chronic kidney disease), stage II    nephrologist-- dr Kathrene Bongo  [facility])   Diabetes mellitus type 2, diet-controlled (HCC)    followed by pcp---   (09-20-2019  per pt does not check blood sugar at home)   Fracture of humeral shaft, right, closed 04/25/2022   GERD (gastroesophageal reflux disease)    History of primary hyperparathyroidism    s/p  parathyroidectomy right inferior on 12-02-2012   Humerus fracture 04/25/2022   Hyperlipidemia    Hypertension    followd by nephrologist----  (09-20-2019  per pt had stress test done some time ago , unsure when/ where, thinks told ok)   OA (osteoarthritis)    Osteoporosis    Prosthetic joint infection (HCC)    followed by dr j. hatcher (ID)   left total shoulder arthroplasty 12/ 2016  ; 12/ 2020  revision with explant joint replacement and hemiarthroplasty;  completed 6 month antibiotic 05/ 2021   Vertigo    Past Surgical History:  Procedure Laterality Date   COLONOSCOPY     DILATATION & CURETTAGE/HYSTEROSCOPY WITH MYOSURE N/A 09/28/2019   Procedure: DILATATION & CURETTAGE/HYSTEROSCOPY WITH MYOSURE;  Surgeon: Olivia Mackie, MD;  Location: [facility];  Service: Gynecology;  Laterality: N/A;   EYE SURGERY  yrs ago   laser surgery for retinal tear.  (unilateral , pt unsure which side)   ORIF HUMERUS FRACTURE Right 04/27/2022   Procedure: OPEN REDUCTION INTERNAL FIXATION (ORIF) PROXIMAL HUMERUS FRACTURE;  Surgeon: Bjorn Pippin, MD;  Location: [facility];  Service: Orthopedics;  Laterality: Right;   PARATHYROIDECTOMY N/A 12/02/2012   Procedure: PARATHYROIDECTOMY with frozen section ;  Surgeon: Velora Heckler, MD;  Location: [facility];  Service: General;  Laterality: N/A;   REVERSE SHOULDER ARTHROPLASTY  Right 04/27/2022   Procedure: REVERSE SHOULDER ARTHROPLASTY;  Surgeon: Bjorn Pippin, MD;  Location: [facility];  Service: Orthopedics;  Laterality: Right;   SHOULDER INJECTION Left 07/27/2013   Procedure: SHOULDER INJECTION;  Surgeon: Loreta Ave, MD;  [facility];  Service: Orthopedics;  Laterality: Left;   STERIOD INJECTION Right 02/14/2015   Procedure: RIGHT SHOULDER CORTISONE INJECTION;  Surgeon: Loreta Ave, MD;  Location: [facility] OR;  Service: Orthopedics;  Laterality: Right;   TOTAL HIP ARTHROPLASTY Left 07/27/2013   Procedure: TOTAL HIP ARTHROPLASTY ANTERIOR APPROACH;  Surgeon: Loreta Ave, MD;  Location: [facility] OR;  Service: Orthopedics;  Laterality: Left;   TOTAL SHOULDER ARTHROPLASTY Left 02/14/2015   Procedure: LEFT TOTAL SHOULDER ARTHROPLASTY;  Surgeon: Loreta Ave, MD;  Location: [facility] OR;  Service: Orthopedics;  Laterality: Left;   TOTAL SHOULDER REVISION Left 02/09/2019   Procedure: TOTAL SHOULDER REVISION;  Surgeon: Bjorn Pippin, MD;  Location: [facility];  Service: Orthopedics;  Laterality: Left;    Allergies  Allergen Reactions   Abaloparatide Other (See Comments)    "Burred vision, lightheaded" (patient does not recall this in 2024)   Mobic [Meloxicam] Hypertension   Crestor [Rosuvastatin] Other (See Comments)    Muscle aches   Norvasc [Amlodipine Besylate] Swelling and Other (See Comments)    Ankle swelling   Simvastatin Other (See Comments)    Muscle aches, high LFTs   Sulfa Antibiotics Rash   Zetia [Ezetimibe] Other (See Comments)    Muscle aches    Outpatient Encounter Medications as of 06/09/2023  Medication Sig   potassium chloride (KLOR-CON M) 10 MEQ tablet Take 1 tablet (10 mEq total) by mouth daily.   acetaminophen (TYLENOL) 325 MG tablet Take 650 mg by mouth every 4 (four) hours as needed for moderate pain (pain score 4-6).   allopurinol (ZYLOPRIM) 300 MG tablet Take 300 mg by mouth daily.   atorvastatin (LIPITOR) 10 MG tablet Take 10 mg by mouth daily.    cephALEXin (KEFLEX) 250 MG capsule Take 250 mg by mouth at bedtime.   cloNIDine (CATAPRES) 0.1 MG tablet Take 0.1 mg by mouth daily as needed (for hypertension- if systolic b/p is over 160).   cyanocobalamin 1000 MCG tablet Take 1,000 mcg by mouth daily.   diclofenac Sodium (VOLTAREN) 1 % GEL Apply 2-4 g topically 4 (four) times daily.   docusate sodium (COLACE) 100 MG capsule Take 100 mg by mouth 2 (two) times daily.   famotidine (PEPCID) 20 MG tablet Take 1 tablet (20 mg total) by mouth 2 (two) times daily.   ferrous sulfate 220 (44 Fe) MG/5ML solution Take 7.5 mLs by mouth 3 (three) times a week. Monday, Wednesday, Friday   hydrALAZINE (APRESOLINE) 100 MG tablet Take 1 tablet (100 mg total) by mouth 3 (three) times daily.   meclizine (ANTIVERT) 12.5 MG tablet Take 1 tablet (12.5 mg total) by mouth 3 (three) times daily as needed for dizziness.   pantoprazole (PROTONIX) 40 MG tablet Take 1 tablet (40  mg total) by mouth 2 (two) times daily.   polyethylene glycol powder (MIRALAX) 17 GM/SCOOP powder Take 17 g by mouth daily. Give 17 gram by mouth in the morning for constipation mix in 8 oz water.   Rivaroxaban (XARELTO) 15 MG TABS tablet Take 1 tablet (15 mg total) by mouth daily with supper.   senna-docusate (SENOKOT-S) 8.6-50 MG tablet Take 1 tablet by mouth 2 (two) times daily.   sucralfate (CARAFATE) 1 GM/10ML suspension Take 10 mLs (1 g total) by mouth 4 (four) times daily -  with meals and at bedtime. Give  1 hour before meals and at bedtime on an empt stomach   tamsulosin (FLOMAX) 0.4 MG CAPS capsule Take 1 capsule (0.4 mg total) by mouth daily.   Zinc Oxide 10 % OINT Apply 1 Application topically See admin instructions. 1 application to buttocks every shift for redness   No facility-administered encounter medications on file as of 06/09/2023.    Review of Systems  Constitutional:  Negative for appetite change, chills, fatigue and fever.  HENT:  Negative for congestion, hearing loss,  rhinorrhea and sore throat.   Eyes: Negative.   Respiratory:  Negative for cough, shortness of breath and wheezing.   Cardiovascular:  Negative for chest pain, palpitations and leg swelling.  Gastrointestinal:  Negative for abdominal pain, constipation, diarrhea, nausea and vomiting.  Genitourinary:  Negative for dysuria.  Musculoskeletal:  Negative for arthralgias, back pain and myalgias.  Skin:  Negative for color change, rash and wound.  Neurological:  Negative for dizziness, weakness and headaches.  Psychiatric/Behavioral:  Negative for behavioral problems. The patient is not nervous/anxious.      Immunization History  Administered Date(s) Administered   Influenza Split 11/24/2008   Influenza, High Dose Seasonal PF 03/16/2014, 01/14/2016, 01/21/2017, 02/24/2022   Influenza,inj,Quad PF,6+ Mos 11/20/2010, 02/01/2013   Influenza-Unspecified 11/20/2010, 02/01/2013, 02/19/2018, 02/24/2022   Moderna Sars-Covid-2 Vaccination 04/23/2019, 05/21/2019, 02/20/2020   Pneumococcal Conjugate-13 03/16/2014   Pneumococcal Polysaccharide-23 04/28/2005   Td 04/28/2005   Td (Adult) 04/28/2005   Tdap 06/08/2017   Zoster Recombinant(Shingrix) 01/14/2019   Zoster, Live 07/21/2007   Pertinent  Health Maintenance Due  Topic Date Due   OPHTHALMOLOGY EXAM  Never done   HEMOGLOBIN A1C  04/15/2023   FOOT EXAM  02/20/2024   DEXA SCAN  Completed   INFLUENZA VACCINE  Discontinued      04/12/2019    2:21 PM 07/19/2019    2:20 PM 09/28/2019    8:51 AM 06/21/2020    5:59 PM 09/23/2021    1:10 PM  Fall Risk  Falls in the past year? 0 0     (RETIRED) Patient Fall Risk Level Low fall risk Low fall risk Moderate fall risk Low fall risk Low fall risk  Fall risk Follow up Falls evaluation completed Falls evaluation completed        Vitals:   06/09/23 1447  BP: 138/78  Pulse: 62  Resp: 18  Temp: 97.9 F (36.6 C)  SpO2: 97%  Weight: 120 lb 12.8 oz (54.8 kg)  Height: 5\' 4"  (1.626 m)   Body mass index  is 20.74 kg/m.  Physical Exam Constitutional:      General: She is not in acute distress.    Appearance: Normal appearance.  HENT:     Head: Normocephalic and atraumatic.     Nose: Nose normal.     Mouth/Throat:     Mouth: Mucous membranes are moist.  Eyes:     Conjunctiva/sclera: Conjunctivae normal.  Cardiovascular:     Rate and Rhythm: Normal rate and regular rhythm.  Pulmonary:     Effort: Pulmonary effort is normal.     Breath sounds: Normal breath sounds.  Abdominal:     General: Bowel sounds are normal.     Palpations: Abdomen is soft.  Musculoskeletal:        General: Normal range of motion.     Cervical back: Normal range of motion.  Skin:    General: Skin is warm and dry.  Neurological:     Mental Status: She is alert.  Psychiatric:        Mood and Affect: Mood normal.        Behavior: Behavior normal.        Labs reviewed: Recent Labs    01/06/23 0926 01/07/23 0425 01/08/23 0413 01/16/23 0000 05/24/23 0701 05/25/23 0603 05/26/23 0540 06/02/23 0000  NA 134* 134* 135   < > 133* 132* 133* 137  K 3.6 3.6 3.1*   < > 3.7 3.5 3.3* 3.0*  CL 106 106 106   < > 103 104 106 107  CO2 22 19* 22   < > 21* 22 20* 24*  GLUCOSE 115* 89 95   < > 96 104* 93  --   BUN 14 13 13    < > 12 10 10 14   CREATININE 0.62 0.65 0.61   < > 0.80 0.79 0.67 0.6  CALCIUM 9.9 9.2 9.1   < > 9.7 9.2 9.3 9.8  MG 1.9 2.2 2.4   < > 1.4* 2.1 1.7  --   PHOS 2.0* 2.6 2.2*  --   --   --   --   --    < > = values in this interval not displayed.   Recent Labs    12/28/22 1256 01/01/23 0416 01/08/23 0413 01/16/23 0000 01/19/23 0000 03/03/23 0000 05/22/23 1116  AST 17  --  17   < > 10* 12* 16  ALT 14  --  11   < > 3* 5* 7  ALKPHOS 85  --  64   < > 79 76 77  BILITOT 0.8  --  0.6  --   --   --  0.8  PROT 6.5  --  5.6*  --   --   --  6.1*  ALBUMIN 3.0*   < > 2.7*   < > 2.6* 3.2* 2.8*   < > = values in this interval not displayed.   Recent Labs    03/03/23 0000 05/22/23 1116  05/23/23 0622 05/24/23 0701 05/25/23 0603 05/26/23 0540 06/02/23 0000  WBC 5.2 8.8   < > 7.6 6.7 6.8 4.8  NEUTROABS 3,474.00 6.3  --   --   --   --  2,568.00  HGB 10.0* 10.7*   < > 10.7* 9.6* 9.2* 9.6*  HCT 31* 34.2*   < > 33.2* 29.4* 28.8* 30*  MCV  --  86.8   < > 84.3 83.1 84.7  --   PLT 258 211   < > 206 183 181 211   < > = values in this interval not displayed.   Lab Results  Component Value Date   TSH 1.438 05/22/2023   Lab Results  Component Value Date   HGBA1C 6.0 (H) 10/13/2022   Lab Results  Component Value Date   CHOL 121 10/13/2022   HDL 44 10/13/2022   LDLCALC 62 10/13/2022   TRIG 74 10/13/2022   CHOLHDL 2.8  10/13/2022    Significant Diagnostic Results in last 30 days:  MR BRAIN WO CONTRAST Result Date: 05/22/2023 CLINICAL DATA:  Mental status change EXAM: MRI HEAD WITHOUT CONTRAST TECHNIQUE: Multiplanar, multiecho pulse sequences of the brain and surrounding structures were obtained without intravenous contrast. COMPARISON:  Same-day head CT. FINDINGS: Brain: No acute infarct. No evidence of intracranial hemorrhage. Scattered and confluent FLAIR hyperintensity in the periventricular and subcortical white matter suggestive of moderate chronic microvascular ischemic changes. Additional FLAIR signal abnormality in the pons likely related to the same. Remote lacunar infarcts in the bilateral basal ganglia and right corona radiata. No mass lesion or midline shift. Cerebellum is unremarkable. Normal appearance of midline structures. The basilar cisterns are patent. No extra-axial fluid collections. Ventricles: Prominence of the lateral ventricles suggestive of underlying parenchymal volume loss. Vascular: Skull base flow voids are visualized. Skull and upper cervical spine: No focal abnormality. Sinuses/Orbits: Orbits are symmetric. Paranasal sinuses are clear. Other: Mastoid air cells are clear. IMPRESSION: No acute intracranial abnormality. Moderate chronic microvascular  ischemic changes. Remote lacunar infarcts in the right corona radiata and bilateral basal ganglia. Mild generalized parenchymal volume loss. Electronically Signed   By: Emily Filbert M.D.   On: 05/22/2023 19:20   DG Chest Port 1 View Result Date: 05/22/2023 CLINICAL DATA:  83 year old female with questionable sepsis EXAM: PORTABLE CHEST 1 VIEW COMPARISON:  12/22/2022 FINDINGS: Cardiomediastinal silhouette unchanged in size and contour. No evidence of central vascular congestion. No interlobular septal thickening. Low lung volumes. Opacity at the left lung base obscuring the left hemidiaphragm and the left heart border. No pneumothorax No acute displaced fracture. Surgical changes of the shoulders incompletely imaged IMPRESSION: Low lung volumes with opacity at the left lung base, potentially pneumonia/consolidation or atelectasis. Electronically Signed   By: Gilmer Mor D.O.   On: 05/22/2023 13:55   CT Head Wo Contrast Result Date: 05/22/2023 CLINICAL DATA:  Mental status change, altered mental status. EXAM: CT HEAD WITHOUT CONTRAST TECHNIQUE: Contiguous axial images were obtained from the base of the skull through the vertex without intravenous contrast. RADIATION DOSE REDUCTION: This exam was performed according to the departmental dose-optimization program which includes automated exposure control, adjustment of the mA and/or kV according to patient size and/or use of iterative reconstruction technique. COMPARISON:  MRI head 12/28/2022, CT head 10/12/2022 FINDINGS: Brain: No acute intracranial hemorrhage. No CT evidence of acute infarct. Nonspecific hypoattenuation in the periventricular and subcortical white matter favored to reflect chronic microvascular ischemic changes. Remote lacunar infarct in the right corona radiata. No edema, mass effect, or midline shift. The basilar cisterns are patent. Ventricles: The ventricles are normal. Vascular: Atherosclerotic calcifications of the carotid siphons and  intracranial vertebral arteries. No hyperdense vessel. Skull: No acute or aggressive finding. Chronic appearing bilateral nasal bone deformities. Orbits: Orbits are symmetric. Sinuses: Mild mucosal thickening in the ethmoid sinuses. Other: Mastoid air cells are clear. IMPRESSION: No CT evidence of acute intracranial abnormality. Moderate chronic microvascular ischemic changes. Small remote infarct in the right corona radiata. Electronically Signed   By: Emily Filbert M.D.   On: 05/22/2023 12:48   DG ESOPHAGUS W SINGLE CM (SOL OR THIN BA) Result Date: 05/19/2023 CLINICAL DATA:  Reflux esophagitis. EXAM: ESOPHOGRAM/BARIUM SWALLOW TECHNIQUE: Single contrast examination was performed using thin barium. FLUOROSCOPY: Radiation Exposure Index (as provided by the fluoroscopic device): 5.2 mGy Kerma COMPARISON:  None Available. FINDINGS: The examination was severely limited due to limited patient mobility. The patient was unable to stand or get into a near  prone/RAO position. The examination was therefore performed with the patient recumbent in the RPO and supine positions. The pharynx and cervical esophagus were not well evaluated. The thoracic esophagus appears normal in caliber without evidence of a gross mass or stricture. No substantial esophageal dysmotility was seen for age. There is a small sliding hiatal hernia. No gastroesophageal reflux was observed. A barium tablet was not administered. IMPRESSION: 1. Severely limited examination as detailed above. 2. Small sliding hiatal hernia. No gastroesophageal reflux was observed during this examination. 3. No evidence of an esophageal stricture. Electronically Signed   By: Sebastian Ache M.D.   On: 05/19/2023 15:59    Assessment/Plan  1. Hypokalemia (Primary) -  Chronic hypokalemia with recent potassium levels of 3.0 mEq/L on June 02, 2023, and 3.2 mEq/L after supplementation. Potassium level decreased again to 3.0 mEq/L on June 08, 2023. No diarrhea, vomiting or  polyuria reported. Not on diuretics. Requires ongoing management to prevent complications associated with hypokalemia. - Administer KCLER 20 mEq, two tablets (total of 40 mEq) PO today. - Prescribe KCLER 10 mEq PO daily. - Recheck BMP in one week. - potassium chloride (KLOR-CON M) 10 MEQ tablet; Take 1 tablet (10 mEq total) by mouth daily.  2. Atrial fibrillation, unspecified type (HCC) -  Atrial fibrillation managed with rivaroxaban 15 mg daily for anticoagulation.  3. Essential hypertension -  Hypertension well-managed with hydralazine 100 mg three times a day and clonidine 0.1 mg daily as needed.  4. Gastroesophageal reflux disease without esophagitis -  GERD managed with sucralfate 1 gram/10 mL, 10 mL before meals and at bedtime, and Protonix delayed release 40 mg twice a day. No episodes of heartburn reported.  5. Urinary retention -  Urinary retention managed with Flomax 0.4 mg daily. No episodes of urinary retention reported     Family/ staff Communication: Discussed plan of care with resident and charge nurse.  Labs/tests ordered: BMP in 1 week    Kenard Gower, DNP, MSN, FNP-BC [facility] [phone] (Monday-Friday 8:00 a.m. - 5:00 p.m.) [phone] (after hours)

## 2023-06-10 DIAGNOSIS — R29898 Other symptoms and signs involving the musculoskeletal system: Secondary | ICD-10-CM | POA: Diagnosis not present

## 2023-06-10 DIAGNOSIS — R2681 Unsteadiness on feet: Secondary | ICD-10-CM | POA: Diagnosis not present

## 2023-06-10 DIAGNOSIS — M6281 Muscle weakness (generalized): Secondary | ICD-10-CM | POA: Diagnosis not present

## 2023-06-11 ENCOUNTER — Telehealth: Payer: Self-pay | Admitting: Hematology

## 2023-06-12 DIAGNOSIS — R2681 Unsteadiness on feet: Secondary | ICD-10-CM | POA: Diagnosis not present

## 2023-06-12 DIAGNOSIS — R29898 Other symptoms and signs involving the musculoskeletal system: Secondary | ICD-10-CM | POA: Diagnosis not present

## 2023-06-12 DIAGNOSIS — M6281 Muscle weakness (generalized): Secondary | ICD-10-CM | POA: Diagnosis not present

## 2023-06-15 DIAGNOSIS — R29898 Other symptoms and signs involving the musculoskeletal system: Secondary | ICD-10-CM | POA: Diagnosis not present

## 2023-06-15 DIAGNOSIS — M6281 Muscle weakness (generalized): Secondary | ICD-10-CM | POA: Diagnosis not present

## 2023-06-15 DIAGNOSIS — R2681 Unsteadiness on feet: Secondary | ICD-10-CM | POA: Diagnosis not present

## 2023-06-16 DIAGNOSIS — E876 Hypokalemia: Secondary | ICD-10-CM | POA: Diagnosis not present

## 2023-06-17 ENCOUNTER — Inpatient Hospital Stay: Attending: Hematology | Admitting: Hematology

## 2023-06-17 DIAGNOSIS — D472 Monoclonal gammopathy: Secondary | ICD-10-CM

## 2023-06-17 DIAGNOSIS — R29898 Other symptoms and signs involving the musculoskeletal system: Secondary | ICD-10-CM | POA: Diagnosis not present

## 2023-06-17 DIAGNOSIS — R2681 Unsteadiness on feet: Secondary | ICD-10-CM | POA: Diagnosis not present

## 2023-06-17 DIAGNOSIS — M6281 Muscle weakness (generalized): Secondary | ICD-10-CM | POA: Diagnosis not present

## 2023-06-17 NOTE — Progress Notes (Signed)
 Brief phone note   Patient is scheduled for a phone visit to review her abdominal MRI results.  I called twice, no answers and was not able to leave her message.  I called her son, and spoke with him.  Patient has recovered well from recent hospital admission for UTI.  She lives in a nursing home.  I reviewed the MRI results with her son, her renal lesion is likely benign, plan to repeat MRI in 6 months.  I will schedule her lab and follow-up in a months.  Malachy Mood  06/17/2023

## 2023-06-17 NOTE — Assessment & Plan Note (Signed)
-  IgM kappa MGUS -pt was hospitalized for metabolic encephalopathy secondary to UTI, AKI, and hypercalcemia on 12/22/2022 -MM lab showed positive M protein 0.3g/dl, and elevated Ig M level at 440mg /dl, slightly elevated kappa light chain with ratio 2.6. -CT CAP with contrast was negative for malignancy, or adenopathy, except a 1.9 cm solid mass in left kidney.  -I reviewed the bone marrow biopsy results, which was negative, no increased plasma cells  in the marrow. -this is consistent with IgM kappa MGUS.  -I reviewed the risk of progression to IgM multiple myeloma or [place] macroglobulinemia, which is moderate (19-37% in next 20 years), and recommend routine lab and office follow-up.

## 2023-06-18 ENCOUNTER — Other Ambulatory Visit: Payer: Self-pay

## 2023-06-18 DIAGNOSIS — R2681 Unsteadiness on feet: Secondary | ICD-10-CM | POA: Diagnosis not present

## 2023-06-18 DIAGNOSIS — I10 Essential (primary) hypertension: Secondary | ICD-10-CM | POA: Diagnosis not present

## 2023-06-18 DIAGNOSIS — R799 Abnormal finding of blood chemistry, unspecified: Secondary | ICD-10-CM | POA: Diagnosis not present

## 2023-06-18 DIAGNOSIS — N179 Acute kidney failure, unspecified: Secondary | ICD-10-CM | POA: Diagnosis not present

## 2023-06-18 DIAGNOSIS — R29898 Other symptoms and signs involving the musculoskeletal system: Secondary | ICD-10-CM | POA: Diagnosis not present

## 2023-06-18 DIAGNOSIS — M6281 Muscle weakness (generalized): Secondary | ICD-10-CM | POA: Diagnosis not present

## 2023-06-18 LAB — COMPREHENSIVE METABOLIC PANEL WITH GFR
Albumin: 3.3 — AB (ref 3.5–5.0)
Calcium: 9.9 (ref 8.7–10.7)
Globulin: 2.6
eGFR: 69

## 2023-06-18 LAB — BASIC METABOLIC PANEL WITH GFR
BUN: 12 (ref 4–21)
CO2: 23 — AB (ref 13–22)
Chloride: 105 (ref 99–108)
Creatinine: 0.9 (ref 0.5–1.1)
Glucose: 121
Potassium: 3.4 meq/L — AB (ref 3.5–5.1)
Sodium: 131 — AB (ref 137–147)

## 2023-06-18 LAB — CBC AND DIFFERENTIAL
HCT: 33 — AB (ref 36–46)
Hemoglobin: 10.8 — AB (ref 12.0–16.0)
Neutrophils Absolute: 2891
Platelets: 198 10*3/uL (ref 150–400)
WBC: 4.9

## 2023-06-18 LAB — HEPATIC FUNCTION PANEL
ALT: 5 U/L — AB (ref 7–35)
AST: 9 — AB (ref 13–35)
Alkaline Phosphatase: 89 (ref 25–125)
Bilirubin, Total: 0.4

## 2023-06-18 LAB — CBC: RBC: 3.83 — AB (ref 3.87–5.11)

## 2023-06-19 ENCOUNTER — Encounter: Payer: Self-pay | Admitting: Nurse Practitioner

## 2023-06-19 ENCOUNTER — Non-Acute Institutional Stay: Payer: Self-pay | Admitting: Nurse Practitioner

## 2023-06-19 DIAGNOSIS — I10 Essential (primary) hypertension: Secondary | ICD-10-CM

## 2023-06-19 DIAGNOSIS — I4891 Unspecified atrial fibrillation: Secondary | ICD-10-CM | POA: Diagnosis not present

## 2023-06-19 DIAGNOSIS — M109 Gout, unspecified: Secondary | ICD-10-CM | POA: Diagnosis not present

## 2023-06-19 DIAGNOSIS — E876 Hypokalemia: Secondary | ICD-10-CM

## 2023-06-19 DIAGNOSIS — K5901 Slow transit constipation: Secondary | ICD-10-CM | POA: Diagnosis not present

## 2023-06-19 DIAGNOSIS — K219 Gastro-esophageal reflux disease without esophagitis: Secondary | ICD-10-CM

## 2023-06-19 DIAGNOSIS — Z87440 Personal history of urinary (tract) infections: Secondary | ICD-10-CM

## 2023-06-19 DIAGNOSIS — K649 Unspecified hemorrhoids: Secondary | ICD-10-CM | POA: Insufficient documentation

## 2023-06-19 DIAGNOSIS — E785 Hyperlipidemia, unspecified: Secondary | ICD-10-CM | POA: Diagnosis not present

## 2023-06-19 DIAGNOSIS — Z87898 Personal history of other specified conditions: Secondary | ICD-10-CM | POA: Diagnosis not present

## 2023-06-19 NOTE — Assessment & Plan Note (Signed)
 Pantoprazole, Famotidine, Sucralfate. Followed by GI

## 2023-06-19 NOTE — Assessment & Plan Note (Signed)
 taking Tamsulosin, stable

## 2023-06-19 NOTE — Assessment & Plan Note (Signed)
 the patient desires to resume Atorvastatin and denied any allergic reaction, LDL 62 10/13/22, LDL 69 04/02/23, f/u lipid panel 6 months.

## 2023-06-19 NOTE — Progress Notes (Signed)
 Location:   [facility] Room Number: 801 Place of Service:  ALF (13) Provider: Arna Snipe Sulema Braid NP  Sigmund Hazel, MD  Patient Care Team: Sigmund Hazel, MD as PCP - General (Family Medicine) Tessa Lerner, DO as PCP - Cardiology (Cardiology) Suzi Roots as Physician Assistant (Dermatology)  Extended Emergency Contact Information Primary Emergency Contact: [name], [place] [address] [place] Home Phone: [phone] Work Phone: [phone] Mobile Phone: [phone] Relation: Son Secondary Emergency Contact: Manes,[name]  [place] Phone: [phone] Relation: Son  Code Status: DNR Goals of care: Advanced Directive information    05/22/2023   11:08 AM  Advanced Directives  Does Patient Have a Medical Advance Directive? No  Would patient like information on creating a medical advance directive? No - Guardian declined     Chief Complaint  Patient presents with   Acute Visit    Hemorrhoidal bleed    HPI:  Pt is a 83 y.o. female seen today for an acute visit for hemorrhoidal bleed, blood coming after BM, no pain complained.    Hospitalized 05/22/23-05/26/23 for AMS, f/u CBC/BMP. Negative CT head/MRI brain and metabolic workup and UA, treated with Rocephin x3 days anyway. Unclear etiology.                            05/22/23 the patient was found in recliner in near fetus position, repetitively saying put my legs down, but yelling out when assistance attempted. The patient stated she cannot open her eyes when asked to. The patient is oriented to person, place. No noted focal weakness. She is afebrile, no O2 desaturation.                           Hx of UTI, on Keflex for suppression therapy.              Hypokalemia, serum K 2.7 04/17/23<<3.4 06/18/23, on  Kcl supplement  Hyponatremia, Na 137 06/18/23             GERD, on  Voquezna per GI 03/27/23  03/27/23 GI dc'd Pantoprazole, Famotidine, Carafage 04/01/23  f/u GI, dc'd Voquenza, restarted Pantoprazole, Famotidine, Sucralfate. Followed by GI, MRI and barium swallow study.               Anemia, added Vit B12(Vit B12 273 12/10/22) and Fe 01/15/23. 01/30/23 Cardiology dc'd ASA, Hgb 10.8 06/18/23             New onset Atrial Fibrillation -2D echo from 10/13/2022 with a EF of 60 to 65%, grade 1 DD and mildly dilated left atrium. -Patient with history of bradycardia arrhythmia for which she was sent to cardiology in May 2024. -Patient felt not a great candidate for long-term anticoagulation but fall risk now is lower due to debility per son. -Patient placed on Xarelto which is preferred medication per family. -Patient has been placed on full dose Lovenox in anticipation of bone marrow biopsy which was done 01/06/2023.   -Resuming Rivaroxaban 01/07/2023. 03/11/22 cardiology H&H, BMP q 6months             TSH 1.438 05/22/23              Saw oncology for MGUS, negative cone marrow biopsy, moderate risk of progression to IgM myeloma or [place] macroglobulinemia(19-37% in next 20 years), f/u.  Hydronephrosis: f/u urology for renal mass left 1.9x1.6cm             Hospitalized 12/22/22 for metabolic encephalopathy 2/2 to UTI, AKI, and hypercalcemia.              Dysphagia, f/u ST             Hospitalized 10/12/22-10/17/22 UTI, encephalopathy, acute CVA             OA R+L knee pain, f/u Ortho for Gel inj, ambulates with walker.              CVA resolved difficulty word finding, off Statin, ASA, f/u stroke clinic, MRI small punctate infarct in the posteerior limb of R internal capsule, no focal weakness             Gout, stable, takes Allopurinol             HLD, the patient desires to resume Atorvastatin and denied any allergic reaction, LDL 62 10/13/22, LDL 69 04/02/23, f/u lipid panel 6 months.              HTN, on Hydralazine,  prn Clonidine             CXR 10/14/22 pulm edema/volume overload, not apparent, off prn Furosemide, BNP 950.8 10/14/22, Echo 60-65% EF  10/13/22             Constipation, stable, on Senna, MiraLax             Prediabetes Hgb A1c 6.0 10/13/22, TSH 1.293 10/12/22, ACR 242(<30)02/26/23             Positive anti CCP test, f/u Rheumatology              Bradycardia, Hx of, off Carvedilol              Hx of urinary retention, taking Tamsulosin. Past Medical History:  Diagnosis Date   CKD (chronic kidney disease), stage II    nephrologist-- dr Kathrene Bongo  [facility])   Diabetes mellitus type 2, diet-controlled (HCC)    followed by pcp---   (09-20-2019  per pt does not check blood sugar at home)   Fracture of humeral shaft, right, closed 04/25/2022   GERD (gastroesophageal reflux disease)    History of primary hyperparathyroidism    s/p  parathyroidectomy right inferior on 12-02-2012   Humerus fracture 04/25/2022   Hyperlipidemia    Hypertension    followd by nephrologist----  (09-20-2019  per pt had stress test done some time ago , unsure when/ where, thinks told ok)   OA (osteoarthritis)    Osteoporosis    Prosthetic joint infection (HCC)    followed by dr j. hatcher (ID)   left total shoulder arthroplasty 12/ 2016  ; 12/ 2020  revision with explant joint replacement and hemiarthroplasty;  completed 6 month antibiotic 05/ 2021   Vertigo    Past Surgical History:  Procedure Laterality Date   COLONOSCOPY     DILATATION & CURETTAGE/HYSTEROSCOPY WITH MYOSURE N/A 09/28/2019   Procedure: DILATATION & CURETTAGE/HYSTEROSCOPY WITH MYOSURE;  Surgeon: Olivia Mackie, MD;  Location: [facility];  Service: Gynecology;  Laterality: N/A;   EYE SURGERY  yrs ago   laser surgery for retinal tear.  (unilateral , pt unsure which side)   ORIF HUMERUS FRACTURE Right 04/27/2022   Procedure: OPEN REDUCTION INTERNAL FIXATION (ORIF) PROXIMAL HUMERUS FRACTURE;  Surgeon: Bjorn Pippin, MD;  Location: [facility];  Service: Orthopedics;  Laterality: Right;  PARATHYROIDECTOMY N/A 12/02/2012   Procedure: PARATHYROIDECTOMY with frozen  section ;  Surgeon: Velora Heckler, MD;  Location: [facility];  Service: General;  Laterality: N/A;   REVERSE SHOULDER ARTHROPLASTY Right 04/27/2022   Procedure: REVERSE SHOULDER ARTHROPLASTY;  Surgeon: Bjorn Pippin, MD;  Location: [facility];  Service: Orthopedics;  Laterality: Right;   SHOULDER INJECTION Left 07/27/2013   Procedure: SHOULDER INJECTION;  Surgeon: Loreta Ave, MD;  Location: [facility] OR;  Service: Orthopedics;  Laterality: Left;   STERIOD INJECTION Right 02/14/2015   Procedure: RIGHT SHOULDER CORTISONE INJECTION;  Surgeon: Loreta Ave, MD;  Location[facility];  Service: Orthopedics;  Laterality: Right;   TOTAL HIP ARTHROPLASTY Left 07/27/2013   Procedure: TOTAL HIP ARTHROPLASTY ANTERIOR APPROACH;  Surgeon: Loreta Ave, MD;  Location: [facility] OR;  Service: Orthopedics;  Laterality: Left;   TOTAL SHOULDER ARTHROPLASTY Left 02/14/2015   Procedure: LEFT TOTAL SHOULDER ARTHROPLASTY;  Surgeon: Loreta Ave, MD;  Location: [facility] OR;  Service: Orthopedics;  Laterality: Left;   TOTAL SHOULDER REVISION Left 02/09/2019   Procedure: TOTAL SHOULDER REVISION;  Surgeon: Bjorn Pippin, MD;  Location: [facility];  Service: Orthopedics;  Laterality: Left;    Allergies  Allergen Reactions   Abaloparatide Other (See Comments)    "Burred vision, lightheaded" (patient does not recall this in 2024)   Mobic [Meloxicam] Hypertension   Crestor [Rosuvastatin] Other (See Comments)    Muscle aches   Norvasc [Amlodipine Besylate] Swelling and Other (See Comments)    Ankle swelling   Simvastatin Other (See Comments)    Muscle aches, high LFTs   Sulfa Antibiotics Rash   Zetia [Ezetimibe] Other (See Comments)    Muscle aches    Allergies as of 06/19/2023       Reactions   Abaloparatide Other (See Comments)   "Burred vision, lightheaded" (patient does not recall this in 2024)   Mobic [meloxicam] Hypertension   Crestor [rosuvastatin] Other (See Comments)   Muscle aches   Norvasc [amlodipine Besylate] Swelling, Other  (See Comments)   Ankle swelling   Simvastatin Other (See Comments)   Muscle aches, high LFTs   Sulfa Antibiotics Rash   Zetia [ezetimibe] Other (See Comments)   Muscle aches        Medication List        Accurate as of June 19, 2023  4:16 PM. If you have any questions, ask your nurse or doctor.          acetaminophen 325 MG tablet Commonly known as: TYLENOL Take 650 mg by mouth every 4 (four) hours as needed for moderate pain (pain score 4-6).   allopurinol 300 MG tablet Commonly known as: ZYLOPRIM Take 300 mg by mouth daily.   atorvastatin 10 MG tablet Commonly known as: LIPITOR Take 10 mg by mouth daily.   cephALEXin 250 MG capsule Commonly known as: KEFLEX Take 250 mg by mouth at bedtime.   cloNIDine 0.1 MG tablet Commonly known as: CATAPRES Take 0.1 mg by mouth daily as needed (for hypertension- if systolic b/p is over 160).   cyanocobalamin 1000 MCG tablet Take 1,000 mcg by mouth daily.   diclofenac Sodium 1 % Gel Commonly known as: VOLTAREN Apply 2-4 g topically 4 (four) times daily.   docusate sodium 100 MG capsule Commonly known as: COLACE Take 100 mg by mouth 2 (two) times daily.   famotidine 20 MG tablet Commonly known as: PEPCID Take 1 tablet (20 mg total) by mouth 2 (two) times daily.  ferrous sulfate 220 (44 Fe) MG/5ML solution Take 7.5 mLs by mouth 3 (three) times a week. Monday, Wednesday, Friday   hydrALAZINE 100 MG tablet Commonly known as: APRESOLINE Take 1 tablet (100 mg total) by mouth 3 (three) times daily.   meclizine 12.5 MG tablet Commonly known as: ANTIVERT Take 1 tablet (12.5 mg total) by mouth 3 (three) times daily as needed for dizziness.   MiraLax 17 GM/SCOOP powder Generic drug: polyethylene glycol powder Take 17 g by mouth daily. Give 17 gram by mouth in the morning for constipation mix in 8 oz water.   pantoprazole 40 MG tablet Commonly known as: PROTONIX Take 1 tablet (40 mg total) by mouth 2 (two) times  daily.   potassium chloride 10 MEQ tablet Commonly known as: KLOR-CON M Take 1 tablet (10 mEq total) by mouth daily.   Rivaroxaban 15 MG Tabs tablet Commonly known as: XARELTO Take 1 tablet (15 mg total) by mouth daily with supper.   senna-docusate 8.6-50 MG tablet Commonly known as: Senokot-S Take 1 tablet by mouth 2 (two) times daily.   sucralfate 1 GM/10ML suspension Commonly known as: Carafate Take 10 mLs (1 g total) by mouth 4 (four) times daily -  with meals and at bedtime. Give  1 hour before meals and at bedtime on an empt stomach   tamsulosin 0.4 MG Caps capsule Commonly known as: FLOMAX Take 1 capsule (0.4 mg total) by mouth daily.   Zinc Oxide 10 % Oint Apply 1 Application topically See admin instructions. 1 application to buttocks every shift for redness        Review of Systems  Constitutional:  Negative for appetite change, fatigue and fever.  HENT:  Negative for congestion, mouth sores and trouble swallowing.   Eyes:  Negative for visual disturbance.  Respiratory:  Negative for cough and shortness of breath.   Cardiovascular:  Negative for leg swelling.  Gastrointestinal:  Positive for anal bleeding. Negative for abdominal pain, blood in stool, constipation, nausea, rectal pain and vomiting.       GERD symptoms intermittently  Genitourinary:  Positive for frequency. Negative for difficulty urinating, dysuria and urgency.       Nocturnal urination 2x average.  Incontinent of urine occasionally   Musculoskeletal:  Positive for arthralgias and gait problem.       R+L knee pain, R+L shoulder limited overhead ROM, c/o pain in the R shoulder, mild.   Skin:  Negative for color change.  Neurological:  Negative for speech difficulty, weakness and headaches.  Psychiatric/Behavioral:  Positive for sleep disturbance. Negative for confusion. The patient is not nervous/anxious.        Sometimes not sleeping, declined sleeping aid.     Immunization History   Administered Date(s) Administered   Influenza Split 11/24/2008   Influenza, High Dose Seasonal PF 03/16/2014, 01/14/2016, 01/21/2017, 02/24/2022   Influenza,inj,Quad PF,6+ Mos 11/20/2010, 02/01/2013   Influenza-Unspecified 11/20/2010, 02/01/2013, 02/19/2018, 02/24/2022   Moderna Sars-Covid-2 Vaccination 04/23/2019, 05/21/2019, 02/20/2020   Pneumococcal Conjugate-13 03/16/2014   Pneumococcal Polysaccharide-23 04/28/2005   Td 04/28/2005   Td (Adult) 04/28/2005   Tdap 06/08/2017   Zoster Recombinant(Shingrix) 01/14/2019   Zoster, Live 07/21/2007   Pertinent  Health Maintenance Due  Topic Date Due   OPHTHALMOLOGY EXAM  Never done   HEMOGLOBIN A1C  04/15/2023   FOOT EXAM  02/20/2024   DEXA SCAN  Completed   INFLUENZA VACCINE  Discontinued      04/12/2019    2:21 PM 07/19/2019    2:20  PM 09/28/2019    8:51 AM 06/21/2020    5:59 PM 09/23/2021    1:10 PM  Fall Risk  Falls in the past year? 0 0     (RETIRED) Patient Fall Risk Level Low fall risk Low fall risk Moderate fall risk Low fall risk Low fall risk  Fall risk Follow up Falls evaluation completed Falls evaluation completed      Functional Status Survey:    Vitals:   06/19/23 1555  BP: (!) 135/58  Pulse: 78  Resp: 18  Temp: 98.3 F (36.8 C)  SpO2: 97%  Weight: 119 lb 3.2 oz (54.1 kg)   Body mass index is 20.46 kg/m. Physical Exam Vitals and nursing note reviewed.  Constitutional:      Appearance: Normal appearance.  HENT:     Head: Normocephalic and atraumatic.     Nose: Nose normal.     Mouth/Throat:     Mouth: Mucous membranes are moist.  Eyes:     Extraocular Movements: Extraocular movements intact.     Conjunctiva/sclera: Conjunctivae normal.     Pupils: Pupils are equal, round, and reactive to light.  Cardiovascular:     Rate and Rhythm: Normal rate and regular rhythm.     Heart sounds: No murmur heard. Pulmonary:     Effort: Pulmonary effort is normal.     Breath sounds: Rales present. No wheezing or  rhonchi.     Comments: Bibasilar rales.  Abdominal:     Palpations: Abdomen is soft.     Tenderness: There is no abdominal tenderness.  Genitourinary:    Comments: External hemorrhoid 7pm, no bleeding upon my examination.  Musculoskeletal:        General: No tenderness.     Cervical back: Normal range of motion and neck supple.     Right lower leg: No edema.     Left lower leg: No edema.     Comments: R+L shoulder limited overhead ROM, mild R shoulder pain. S/p R+L shoulder replacement.   Skin:    General: Skin is warm and dry.     Comments:    Neurological:     General: No focal deficit present.     Mental Status: She is alert and oriented to person, place, and time. Mental status is at baseline.     Gait: Gait abnormal.     Comments: Ambulates with walker.   Psychiatric:        Mood and Affect: Mood normal.        Behavior: Behavior normal.        Thought Content: Thought content normal.     Labs reviewed: Recent Labs    01/06/23 0926 01/07/23 0425 01/08/23 0413 01/16/23 0000 05/24/23 0701 05/25/23 0603 05/26/23 0540 06/02/23 0000  NA 134* 134* 135   < > 133* 132* 133* 137  K 3.6 3.6 3.1*   < > 3.7 3.5 3.3* 3.0*  CL 106 106 106   < > 103 104 106 107  CO2 22 19* 22   < > 21* 22 20* 24*  GLUCOSE 115* 89 95   < > 96 104* 93  --   BUN 14 13 13    < > 12 10 10 14   CREATININE 0.62 0.65 0.61   < > 0.80 0.79 0.67 0.6  CALCIUM 9.9 9.2 9.1   < > 9.7 9.2 9.3 9.8  MG 1.9 2.2 2.4   < > 1.4* 2.1 1.7  --   PHOS 2.0* 2.6 2.2*  --   --   --   --   --    < > =  values in this interval not displayed.   Recent Labs    12/28/22 1256 01/01/23 0416 01/08/23 0413 01/16/23 0000 01/19/23 0000 03/03/23 0000 05/22/23 1116  AST 17  --  17   < > 10* 12* 16  ALT 14  --  11   < > 3* 5* 7  ALKPHOS 85  --  64   < > 79 76 77  BILITOT 0.8  --  0.6  --   --   --  0.8  PROT 6.5  --  5.6*  --   --   --  6.1*  ALBUMIN 3.0*   < > 2.7*   < > 2.6* 3.2* 2.8*   < > = values in this interval not  displayed.   Recent Labs    03/03/23 0000 05/22/23 1116 05/23/23 0622 05/24/23 0701 05/25/23 0603 05/26/23 0540 06/02/23 0000  WBC 5.2 8.8   < > 7.6 6.7 6.8 4.8  NEUTROABS 3,474.00 6.3  --   --   --   --  2,568.00  HGB 10.0* 10.7*   < > 10.7* 9.6* 9.2* 9.6*  HCT 31* 34.2*   < > 33.2* 29.4* 28.8* 30*  MCV  --  86.8   < > 84.3 83.1 84.7  --   PLT 258 211   < > 206 183 181 211   < > = values in this interval not displayed.   Lab Results  Component Value Date   TSH 1.438 05/22/2023   Lab Results  Component Value Date   HGBA1C 6.0 (H) 10/13/2022   Lab Results  Component Value Date   CHOL 121 10/13/2022   HDL 44 10/13/2022   LDLCALC 62 10/13/2022   TRIG 74 10/13/2022   CHOLHDL 2.8 10/13/2022    Significant Diagnostic Results in last 30 days:  MR BRAIN WO CONTRAST Result Date: 05/22/2023 CLINICAL DATA:  Mental status change EXAM: MRI HEAD WITHOUT CONTRAST TECHNIQUE: Multiplanar, multiecho pulse sequences of the brain and surrounding structures were obtained without intravenous contrast. COMPARISON:  Same-day head CT. FINDINGS: Brain: No acute infarct. No evidence of intracranial hemorrhage. Scattered and confluent FLAIR hyperintensity in the periventricular and subcortical white matter suggestive of moderate chronic microvascular ischemic changes. Additional FLAIR signal abnormality in the pons likely related to the same. Remote lacunar infarcts in the bilateral basal ganglia and right corona radiata. No mass lesion or midline shift. Cerebellum is unremarkable. Normal appearance of midline structures. The basilar cisterns are patent. No extra-axial fluid collections. Ventricles: Prominence of the lateral ventricles suggestive of underlying parenchymal volume loss. Vascular: Skull base flow voids are visualized. Skull and upper cervical spine: No focal abnormality. Sinuses/Orbits: Orbits are symmetric. Paranasal sinuses are clear. Other: Mastoid air cells are clear. IMPRESSION: No acute  intracranial abnormality. Moderate chronic microvascular ischemic changes. Remote lacunar infarcts in the right corona radiata and bilateral basal ganglia. Mild generalized parenchymal volume loss. Electronically Signed   By: Emily Filbert M.D.   On: 05/22/2023 19:20   DG Chest Port 1 View Result Date: 05/22/2023 CLINICAL DATA:  83 year old female with questionable sepsis EXAM: PORTABLE CHEST 1 VIEW COMPARISON:  12/22/2022 FINDINGS: Cardiomediastinal silhouette unchanged in size and contour. No evidence of central vascular congestion. No interlobular septal thickening. Low lung volumes. Opacity at the left lung base obscuring the left hemidiaphragm and the left heart border. No pneumothorax No acute displaced fracture. Surgical changes of the shoulders incompletely imaged IMPRESSION: Low lung volumes with opacity at the left lung base, potentially pneumonia/consolidation  or atelectasis. Electronically Signed   By: Gilmer Mor D.O.   On: 05/22/2023 13:55   CT Head Wo Contrast Result Date: 05/22/2023 CLINICAL DATA:  Mental status change, altered mental status. EXAM: CT HEAD WITHOUT CONTRAST TECHNIQUE: Contiguous axial images were obtained from the base of the skull through the vertex without intravenous contrast. RADIATION DOSE REDUCTION: This exam was performed according to the departmental dose-optimization program which includes automated exposure control, adjustment of the mA and/or kV according to patient size and/or use of iterative reconstruction technique. COMPARISON:  MRI head 12/28/2022, CT head 10/12/2022 FINDINGS: Brain: No acute intracranial hemorrhage. No CT evidence of acute infarct. Nonspecific hypoattenuation in the periventricular and subcortical white matter favored to reflect chronic microvascular ischemic changes. Remote lacunar infarct in the right corona radiata. No edema, mass effect, or midline shift. The basilar cisterns are patent. Ventricles: The ventricles are normal. Vascular:  Atherosclerotic calcifications of the carotid siphons and intracranial vertebral arteries. No hyperdense vessel. Skull: No acute or aggressive finding. Chronic appearing bilateral nasal bone deformities. Orbits: Orbits are symmetric. Sinuses: Mild mucosal thickening in the ethmoid sinuses. Other: Mastoid air cells are clear. IMPRESSION: No CT evidence of acute intracranial abnormality. Moderate chronic microvascular ischemic changes. Small remote infarct in the right corona radiata. Electronically Signed   By: Emily Filbert M.D.   On: 05/22/2023 12:48    Assessment/Plan: Hemorrhoids Avoid constipation, continue Senna, MiraLax, apply 2.5% Hydrocortisone cream bid.   History of urinary retention  taking Tamsulosin, stable.   Slow transit constipation stable, on Senna, MiraLax  Essential hypertension Blood pressure is controlled, on Hydralazine,  prn Clonidine  Hyperlipidemia the patient desires to resume Atorvastatin and denied any allergic reaction, LDL 62 10/13/22, LDL 69 04/02/23, f/u lipid panel 6 months.   Gout stable, takes Allopurinol  Atrial fibrillation (HCC) Heart rate is in control, resumed Xarelto  Gastroesophageal reflux disease Pantoprazole, Famotidine, Sucralfate. Followed by GI  Hypokalemia serum K 2.7 04/17/23<<3.4 06/18/23, on  Kcl supplement  History of recurrent UTIs on Keflex for suppression therapy.     Family/ staff Communication: plan of care reviewed with the patient and charge nurse.   Labs/tests ordered: CBC/diff, CMP/eGFR

## 2023-06-19 NOTE — Assessment & Plan Note (Signed)
 serum K 2.7 04/17/23<<3.4 06/18/23, on  Kcl supplement

## 2023-06-19 NOTE — Assessment & Plan Note (Signed)
stable, on Senna, MiraLax.  

## 2023-06-19 NOTE — Assessment & Plan Note (Signed)
 Avoid constipation, continue Senna, MiraLax, apply 2.5% Hydrocortisone cream bid.

## 2023-06-19 NOTE — Assessment & Plan Note (Signed)
 Heart rate is in control, resumed Xarelto

## 2023-06-19 NOTE — Assessment & Plan Note (Signed)
 stable, takes Allopurinol.

## 2023-06-19 NOTE — Assessment & Plan Note (Signed)
 Blood pressure is controlled, on Hydralazine,  prn Clonidine

## 2023-06-19 NOTE — Assessment & Plan Note (Signed)
 on Keflex for suppression therapy.

## 2023-06-22 DIAGNOSIS — R29898 Other symptoms and signs involving the musculoskeletal system: Secondary | ICD-10-CM | POA: Diagnosis not present

## 2023-06-22 DIAGNOSIS — M6281 Muscle weakness (generalized): Secondary | ICD-10-CM | POA: Diagnosis not present

## 2023-06-22 DIAGNOSIS — R2681 Unsteadiness on feet: Secondary | ICD-10-CM | POA: Diagnosis not present

## 2023-06-23 DIAGNOSIS — R29898 Other symptoms and signs involving the musculoskeletal system: Secondary | ICD-10-CM | POA: Diagnosis not present

## 2023-06-23 DIAGNOSIS — R2681 Unsteadiness on feet: Secondary | ICD-10-CM | POA: Diagnosis not present

## 2023-06-23 DIAGNOSIS — M6281 Muscle weakness (generalized): Secondary | ICD-10-CM | POA: Diagnosis not present

## 2023-06-25 DIAGNOSIS — M6281 Muscle weakness (generalized): Secondary | ICD-10-CM | POA: Diagnosis not present

## 2023-06-25 DIAGNOSIS — R29898 Other symptoms and signs involving the musculoskeletal system: Secondary | ICD-10-CM | POA: Diagnosis not present

## 2023-06-25 DIAGNOSIS — R2681 Unsteadiness on feet: Secondary | ICD-10-CM | POA: Diagnosis not present

## 2023-06-29 ENCOUNTER — Non-Acute Institutional Stay: Payer: Self-pay | Admitting: Sports Medicine

## 2023-06-29 ENCOUNTER — Encounter: Payer: Self-pay | Admitting: Sports Medicine

## 2023-06-29 DIAGNOSIS — I4891 Unspecified atrial fibrillation: Secondary | ICD-10-CM | POA: Diagnosis not present

## 2023-06-29 DIAGNOSIS — K219 Gastro-esophageal reflux disease without esophagitis: Secondary | ICD-10-CM | POA: Diagnosis not present

## 2023-06-29 DIAGNOSIS — E785 Hyperlipidemia, unspecified: Secondary | ICD-10-CM

## 2023-06-29 DIAGNOSIS — R2681 Unsteadiness on feet: Secondary | ICD-10-CM | POA: Diagnosis not present

## 2023-06-29 DIAGNOSIS — Z8673 Personal history of transient ischemic attack (TIA), and cerebral infarction without residual deficits: Secondary | ICD-10-CM | POA: Diagnosis not present

## 2023-06-29 DIAGNOSIS — M6281 Muscle weakness (generalized): Secondary | ICD-10-CM | POA: Diagnosis not present

## 2023-06-29 DIAGNOSIS — M109 Gout, unspecified: Secondary | ICD-10-CM

## 2023-06-29 DIAGNOSIS — I10 Essential (primary) hypertension: Secondary | ICD-10-CM

## 2023-06-29 DIAGNOSIS — D472 Monoclonal gammopathy: Secondary | ICD-10-CM

## 2023-06-29 DIAGNOSIS — R29898 Other symptoms and signs involving the musculoskeletal system: Secondary | ICD-10-CM | POA: Diagnosis not present

## 2023-06-29 NOTE — Progress Notes (Signed)
 Provider:  Dr. Tye Gall Location:  [facility] Place of Service:   Assisted living   PCP: [name], MD Patient Care Team: [name], MD as PCP - General (Family Medicine) Olinda Bertrand, DO as PCP - Cardiology (Cardiology) Dorthey Gave, PA-C as Physician Assistant (Dermatology)  Extended Emergency Contact Information Primary Emergency Contact: Tyl[place], [place], [place] [address] [place] Home Phone: [phone] Work Phone: [phone] Mobile Phone: [phone] Relation: Son Secondary Emergency Contact: Sawaya,[name]  [place] Mobile Phone: [phone] Relation: Son  Goals of Care: Advanced Directive information    05/22/2023   11:08 AM  Advanced Directives  Does Patient Have a Medical Advance Directive? No  Would patient like information on creating a medical advance directive? No - Guardian declined          History of Present Illness   83 yr old F with PMH of Afib, recurrent UTI, GERD, hypokalemia, hypothyroidism, history of CVA, hyperlipidemia, hypertension is evaluated for the chronic disease management. Patient seen and examined in her room. She is sitting in a chair eating her lunch. Seems comfortable and does not appear to be in distress. Patient complains of intermittent acid reflux denies abdominal pain, nausea, vomiting, bloody or dark stools. Patient is able to transfer herself independently from bed to chair and able to ambulate with a walker. She is currently on physical therapy. Patient complains of bilateral shoulder pain and receiving occupational therapy.   History of constipation-patient reports that she is moving her bowels regularly.  Denies blood in the stool. History of recurrent UTI-patient saw urology and currently on low-dose antibiotics for prophylaxis. Patient denies dysuria, frequency, lower abdominal pain, hematuria, fevers.  MGUS-patient following with oncology  recently and recommended repeat MRI in 6 months for benign renal lesion and follow-up labs for IgM MGUS.   Past Medical History:  Diagnosis Date   CKD (chronic kidney disease), stage II    nephrologist-- dr Ansel Kingdom  [facility])   Diabetes mellitus type 2, diet-controlled (HCC)    followed by pcp---   (09-20-2019  per pt does not check blood sugar at home)   Fracture of humeral shaft, right, closed 04/25/2022   GERD (gastroesophageal reflux disease)    History of primary hyperparathyroidism    s/p  parathyroidectomy right inferior on 12-02-2012   Humerus fracture 04/25/2022   Hyperlipidemia    Hypertension    followd by nephrologist----  (09-20-2019  per pt had stress test done some time ago , unsure when/ where, thinks told ok)   OA (osteoarthritis)    Osteoporosis    Prosthetic joint infection (HCC)    followed by dr j. hatcher (ID)   left total shoulder arthroplasty 12/ 2016  ; 12/ 2020  revision with explant joint replacement and hemiarthroplasty;  completed 6 month antibiotic 05/ 2021   Vertigo    Past Surgical History:  Procedure Laterality Date   COLONOSCOPY     DILATATION & CURETTAGE/HYSTEROSCOPY WITH MYOSURE N/A 09/28/2019   Procedure: DILATATION & CURETTAGE/HYSTEROSCOPY WITH MYOSURE;  Surgeon: Meriam Stamp, MD;  Location: [facility];  Service: Gynecology;  Laterality: N/A;   EYE SURGERY  yrs ago   laser surgery for retinal tear.  (unilateral , pt unsure which side)   ORIF HUMERUS FRACTURE Right 04/27/2022   Procedure: OPEN REDUCTION INTERNAL FIXATION (ORIF) PROXIMAL HUMERUS FRACTURE;  Surgeon: Micheline Ahr, MD;  Location: [facility];  Service: Orthopedics;  Laterality: Right;   PARATHYROIDECTOMY N/A 12/02/2012   Procedure: PARATHYROIDECTOMY with frozen section ;  Surgeon: Keitha Pata, MD;  Location: [facility];  Service: General;  Laterality: N/A;   REVERSE SHOULDER ARTHROPLASTY Right 04/27/2022   Procedure: REVERSE SHOULDER ARTHROPLASTY;  Surgeon:  Micheline Ahr, MD;  Location: [facility];  Service: Orthopedics;  Laterality: Right;   SHOULDER INJECTION Left 07/27/2013   Procedure: SHOULDER INJECTION;  Surgeon: Ferd Householder, MD;  Location[facility];  Service: Orthopedics;  Laterality: Left;   STERIOD INJECTION Right 02/14/2015   Procedure: RIGHT SHOULDER CORTISONE INJECTION;  Surgeon: Ferd Householder, MD;  Location: [facility];  Service: Orthopedics;  Laterality: Right;   TOTAL HIP ARTHROPLASTY Left 07/27/2013   Procedure: TOTAL HIP ARTHROPLASTY ANTERIOR APPROACH;  Surgeon: Ferd Householder, MD;  Location: [facility] OR;  Service: Orthopedics;  Laterality: Left;   TOTAL SHOULDER ARTHROPLASTY Left 02/14/2015   Procedure: LEFT TOTAL SHOULDER ARTHROPLASTY;  Surgeon: Ferd Householder, MD;  Location[facility] OR;  Service: Orthopedics;  Laterality: Left;   TOTAL SHOULDER REVISION Left 02/09/2019   Procedure: TOTAL SHOULDER REVISION;  Surgeon: Micheline Ahr, MD;  Location: [facility];  Service: Orthopedics;  Laterality: Left;    reports that she quit smoking about 35 years ago. Her smoking use included cigarettes. She started smoking about 55 years ago. She has a 20 pack-year smoking history. She has never used smokeless tobacco. She reports that she does not drink alcohol and does not use drugs. Social History   Socioeconomic History   Marital status: Widowed    Spouse name: Not on file   Number of children: Not on file   Years of education: Not on file   Highest education level: Not on file  Occupational History   Not on file  Tobacco Use   Smoking status: Former    Current packs/day: 0.00    Average packs/day: 1 pack/day for 20.0 years (20.0 ttl pk-yrs)    Types: Cigarettes    Start date: 10/28/1967    Quit date: 10/28/1987    Years since quitting: 35.6   Smokeless tobacco: Never  Vaping Use   Vaping status: Never Used  Substance and Sexual Activity   Alcohol use: No   Drug use: Never   Sexual activity: Yes    Birth control/protection: Post-menopausal  Other  Topics Concern   Not on file  Social History Narrative   Not on file   Social Drivers of Health   Financial Resource Strain: Not on file  Food Insecurity: No Food Insecurity (05/22/2023)   Hunger Vital Sign    Worried About Running Out of Food in the Last Year: Never true    Ran Out of Food in the Last Year: Never true  Transportation Needs: No Transportation Needs (05/22/2023)   PRAPARE - Administrator, Civil Service (Medical): No    Lack of Transportation (Non-Medical): No  Physical Activity: Not on file  Stress: Not on file  Social Connections: Unknown (05/22/2023)   Social Connection and Isolation Panel [NHANES]    Frequency of Communication with Friends and Family: Patient unable to answer    Frequency of Social Gatherings with Friends and Family: Patient unable to answer    Attends Religious Services: Patient unable to answer    Active Member of Clubs or Organizations: Patient unable to answer    Attends Banker Meetings: Patient unable to answer    Marital Status: Widowed  Intimate Partner Violence: Not At  Risk (05/22/2023)   Humiliation, Afraid, Rape, and Kick questionnaire    Fear of Current or Ex-Partner: No    Emotionally Abused: No    Physically Abused: No    Sexually Abused: No    Functional Status Survey:    Family History  Problem Relation Age of Onset   Heart failure Mother    Cancer Mother    Breast cancer Mother        in her 16s   Cancer Father     Health Maintenance  Topic Date Due   OPHTHALMOLOGY EXAM  Never done   HEMOGLOBIN A1C  04/15/2023   FOOT EXAM  02/20/2024   Medicare Annual Wellness (AWV)  02/20/2024   Diabetic kidney evaluation - Urine ACR  02/26/2024   Diabetic kidney evaluation - eGFR measurement  06/01/2024   DTaP/Tdap/Td (4 - Td or Tdap) 06/09/2027   Pneumonia Vaccine 5+ Years old  Completed   DEXA SCAN  Completed   HPV VACCINES  Aged Out   Meningococcal B Vaccine  Aged Out   INFLUENZA VACCINE   Discontinued   COVID-19 Vaccine  Discontinued   Zoster Vaccines- Shingrix  Discontinued    Allergies  Allergen Reactions   Abaloparatide Other (See Comments)    "Burred vision, lightheaded" (patient does not recall this in 2024)   Mobic  [Meloxicam ] Hypertension   Crestor [Rosuvastatin] Other (See Comments)    Muscle aches   Norvasc [Amlodipine Besylate] Swelling and Other (See Comments)    Ankle swelling   Simvastatin Other (See Comments)    Muscle aches, high LFTs   Sulfa Antibiotics Rash   Zetia [Ezetimibe] Other (See Comments)    Muscle aches    Outpatient Encounter Medications as of 06/29/2023  Medication Sig   acetaminophen  (TYLENOL ) 325 MG tablet Take 650 mg by mouth every 4 (four) hours as needed for moderate pain (pain score 4-6).   allopurinol  (ZYLOPRIM ) 300 MG tablet Take 300 mg by mouth daily.   atorvastatin  (LIPITOR) 10 MG tablet Take 10 mg by mouth daily.   cephALEXin  (KEFLEX ) 250 MG capsule Take 250 mg by mouth at bedtime.   cloNIDine  (CATAPRES ) 0.1 MG tablet Take 0.1 mg by mouth daily as needed (for hypertension- if systolic b/p is over 160).   cyanocobalamin  1000 MCG tablet Take 1,000 mcg by mouth daily.   diclofenac Sodium (VOLTAREN) 1 % GEL Apply 2-4 g topically 4 (four) times daily.   docusate sodium  (COLACE) 100 MG capsule Take 100 mg by mouth 2 (two) times daily.   famotidine  (PEPCID ) 20 MG tablet Take 1 tablet (20 mg total) by mouth 2 (two) times daily.   ferrous sulfate  220 (44 Fe) MG/5ML solution Take 7.5 mLs by mouth 3 (three) times a week. Monday, Wednesday, Friday   hydrALAZINE  (APRESOLINE ) 100 MG tablet Take 1 tablet (100 mg total) by mouth 3 (three) times daily.   meclizine  (ANTIVERT ) 12.5 MG tablet Take 1 tablet (12.5 mg total) by mouth 3 (three) times daily as needed for dizziness.   pantoprazole  (PROTONIX ) 40 MG tablet Take 1 tablet (40 mg total) by mouth 2 (two) times daily.   polyethylene glycol powder (MIRALAX ) 17 GM/SCOOP powder Take 17 g by  mouth daily. Give 17 gram by mouth in the morning for constipation mix in 8 oz water .   potassium chloride  (KLOR-CON  M) 10 MEQ tablet Take 1 tablet (10 mEq total) by mouth daily.   Rivaroxaban  (XARELTO ) 15 MG TABS tablet Take 1 tablet (15 mg total) by mouth daily  with supper.   senna-docusate (SENOKOT-S) 8.6-50 MG tablet Take 1 tablet by mouth 2 (two) times daily.   sucralfate  (CARAFATE ) 1 GM/10ML suspension Take 10 mLs (1 g total) by mouth 4 (four) times daily -  with meals and at bedtime. Give  1 hour before meals and at bedtime on an empt stomach   tamsulosin  (FLOMAX ) 0.4 MG CAPS capsule Take 1 capsule (0.4 mg total) by mouth daily.   Zinc Oxide 10 % OINT Apply 1 Application topically See admin instructions. 1 application to buttocks every shift for redness   No facility-administered encounter medications on file as of 06/29/2023.    Review of Systems  Constitutional:  Negative for fever.  Respiratory:  Negative for cough and shortness of breath.   Cardiovascular:  Negative for chest pain and leg swelling.  Gastrointestinal:  Negative for abdominal distention, abdominal pain, blood in stool, constipation, diarrhea, nausea and vomiting.  Genitourinary:  Negative for dysuria.  Neurological:  Negative for dizziness.   Negative unless indicated in HPI.  There were no vitals filed for this visit. There is no height or weight on file to calculate BMI. BP Readings from Last 3 Encounters:  06/19/23 (!) 135/58  06/09/23 138/78  06/05/23 (!) 158/67   Wt Readings from Last 3 Encounters:  06/19/23 119 lb 3.2 oz (54.1 kg)  06/09/23 120 lb 12.8 oz (54.8 kg)  06/05/23 114 lb 12.8 oz (52.1 kg)   Physical Exam Constitutional:      Appearance: Normal appearance.  HENT:     Head: Normocephalic and atraumatic.  Cardiovascular:     Rate and Rhythm: Normal rate. Rhythm irregular.  Pulmonary:     Effort: Pulmonary effort is normal. No respiratory distress.     Breath sounds: Normal breath  sounds. No wheezing.  Abdominal:     General: Bowel sounds are normal. There is no distension.     Tenderness: There is no abdominal tenderness. There is no guarding.     Comments:    Musculoskeletal:        General: No swelling or tenderness.  Neurological:     Mental Status: She is alert. Mental status is at baseline.     Labs reviewed: Basic Metabolic Panel: Recent Labs    01/06/23 0926 01/07/23 0425 01/08/23 0413 01/16/23 0000 05/24/23 0701 05/25/23 0603 05/26/23 0540 06/02/23 0000  NA 134* 134* 135   < > 133* 132* 133* 137  K 3.6 3.6 3.1*   < > 3.7 3.5 3.3* 3.0*  CL 106 106 106   < > 103 104 106 107  CO2 22 19* 22   < > 21* 22 20* 24*  GLUCOSE 115* 89 95   < > 96 104* 93  --   BUN 14 13 13    < > 12 10 10 14   CREATININE 0.62 0.65 0.61   < > 0.80 0.79 0.67 0.6  CALCIUM  9.9 9.2 9.1   < > 9.7 9.2 9.3 9.8  MG 1.9 2.2 2.4   < > 1.4* 2.1 1.7  --   PHOS 2.0* 2.6 2.2*  --   --   --   --   --    < > = values in this interval not displayed.   Liver Function Tests: Recent Labs    12/28/22 1256 01/01/23 0416 01/08/23 0413 01/16/23 0000 01/19/23 0000 03/03/23 0000 05/22/23 1116  AST 17  --  17   < > 10* 12* 16  ALT 14  --  11   < >  3* 5* 7  ALKPHOS 85  --  64   < > 79 76 77  BILITOT 0.8  --  0.6  --   --   --  0.8  PROT 6.5  --  5.6*  --   --   --  6.1*  ALBUMIN  3.0*   < > 2.7*   < > 2.6* 3.2* 2.8*   < > = values in this interval not displayed.   Recent Labs    10/12/22 2013 05/22/23 1450  LIPASE 26 28   Recent Labs    10/12/22 2104 12/28/22 1256 05/22/23 1450  AMMONIA <10 <10 13   CBC: Recent Labs    03/03/23 0000 05/22/23 1116 05/23/23 0622 05/24/23 0701 05/25/23 0603 05/26/23 0540 06/02/23 0000  WBC 5.2 8.8   < > 7.6 6.7 6.8 4.8  NEUTROABS 3,474.00 6.3  --   --   --   --  2,568.00  HGB 10.0* 10.7*   < > 10.7* 9.6* 9.2* 9.6*  HCT 31* 34.2*   < > 33.2* 29.4* 28.8* 30*  MCV  --  86.8   < > 84.3 83.1 84.7  --   PLT 258 211   < > 206 183 181 211    < > = values in this interval not displayed.   Cardiac Enzymes: No results for input(s): "CKTOTAL", "CKMB", "CKMBINDEX", "TROPONINI" in the last 8760 hours. BNP: Invalid input(s): "POCBNP" Lab Results  Component Value Date   HGBA1C 6.0 (H) 10/13/2022   Lab Results  Component Value Date   TSH 1.438 05/22/2023   Lab Results  Component Value Date   VITAMINB12 1,459 (H) 05/22/2023   Lab Results  Component Value Date   FOLATE 11.7 01/03/2023   Lab Results  Component Value Date   IRON 16 01/19/2023   TIBC 155 01/19/2023   FERRITIN 88 01/19/2023    Imaging and Procedures obtained prior to SNF admission: MR BRAIN WO CONTRAST Result Date: 05/22/2023 CLINICAL DATA:  Mental status change EXAM: MRI HEAD WITHOUT CONTRAST TECHNIQUE: Multiplanar, multiecho pulse sequences of the brain and surrounding structures were obtained without intravenous contrast. COMPARISON:  Same-day head CT. FINDINGS: Brain: No acute infarct. No evidence of intracranial hemorrhage. Scattered and confluent FLAIR hyperintensity in the periventricular and subcortical white matter suggestive of moderate chronic microvascular ischemic changes. Additional FLAIR signal abnormality in the pons likely related to the same. Remote lacunar infarcts in the bilateral basal ganglia and right corona radiata. No mass lesion or midline shift. Cerebellum is unremarkable. Normal appearance of midline structures. The basilar cisterns are patent. No extra-axial fluid collections. Ventricles: Prominence of the lateral ventricles suggestive of underlying parenchymal volume loss. Vascular: Skull base flow voids are visualized. Skull and upper cervical spine: No focal abnormality. Sinuses/Orbits: Orbits are symmetric. Paranasal sinuses are clear. Other: Mastoid air cells are clear. IMPRESSION: No acute intracranial abnormality. Moderate chronic microvascular ischemic changes. Remote lacunar infarcts in the right corona radiata and bilateral basal  ganglia. Mild generalized parenchymal volume loss. Electronically Signed   By: Denny Flack M.D.   On: 05/22/2023 19:20   DG Chest Port 1 View Result Date: 05/22/2023 CLINICAL DATA:  83 year old female with questionable sepsis EXAM: PORTABLE CHEST 1 VIEW COMPARISON:  12/22/2022 FINDINGS: Cardiomediastinal silhouette unchanged in size and contour. No evidence of central vascular congestion. No interlobular septal thickening. Low lung volumes. Opacity at the left lung base obscuring the left hemidiaphragm and the left heart border. No pneumothorax No acute displaced fracture. Surgical changes  of the shoulders incompletely imaged IMPRESSION: Low lung volumes with opacity at the left lung base, potentially pneumonia/consolidation or atelectasis. Electronically Signed   By: Myrlene Asper D.O.   On: 05/22/2023 13:55   CT Head Wo Contrast Result Date: 05/22/2023 CLINICAL DATA:  Mental status change, altered mental status. EXAM: CT HEAD WITHOUT CONTRAST TECHNIQUE: Contiguous axial images were obtained from the base of the skull through the vertex without intravenous contrast. RADIATION DOSE REDUCTION: This exam was performed according to the departmental dose-optimization program which includes automated exposure control, adjustment of the mA and/or kV according to patient size and/or use of iterative reconstruction technique. COMPARISON:  MRI head 12/28/2022, CT head 10/12/2022 FINDINGS: Brain: No acute intracranial hemorrhage. No CT evidence of acute infarct. Nonspecific hypoattenuation in the periventricular and subcortical white matter favored to reflect chronic microvascular ischemic changes. Remote lacunar infarct in the right corona radiata. No edema, mass effect, or midline shift. The basilar cisterns are patent. Ventricles: The ventricles are normal. Vascular: Atherosclerotic calcifications of the carotid siphons and intracranial vertebral arteries. No hyperdense vessel. Skull: No acute or aggressive  finding. Chronic appearing bilateral nasal bone deformities. Orbits: Orbits are symmetric. Sinuses: Mild mucosal thickening in the ethmoid sinuses. Other: Mastoid air cells are clear. IMPRESSION: No CT evidence of acute intracranial abnormality. Moderate chronic microvascular ischemic changes. Small remote infarct in the right corona radiata. Electronically Signed   By: Denny Flack M.D.   On: 05/22/2023 12:48    Assessment and Plan Assessment & Plan  A fib  Rate controlled No signs of bleeding Cont with xarelto    GERD Stable Cont with protonix , famotidine , sucralfate    Recurrent UTI  Denies dysuria, lower abdominal pain  Cont with cephalexin  for abx prophylaxis as per urology   H/O CVA HLD  Cont with lipitor   Gout  Cont with allopurinol     HTN  Cont with hydralazine    MGUS  Stable  Follow up with oncology   30 min Total time spent for obtaining history,  performing a medically appropriate examination and evaluation, reviewing the tests, ,  documenting clinical information in the electronic or other health record,care coordination (not separately reported)

## 2023-06-30 DIAGNOSIS — R2681 Unsteadiness on feet: Secondary | ICD-10-CM | POA: Diagnosis not present

## 2023-06-30 DIAGNOSIS — R29898 Other symptoms and signs involving the musculoskeletal system: Secondary | ICD-10-CM | POA: Diagnosis not present

## 2023-06-30 DIAGNOSIS — M6281 Muscle weakness (generalized): Secondary | ICD-10-CM | POA: Diagnosis not present

## 2023-07-02 DIAGNOSIS — R2681 Unsteadiness on feet: Secondary | ICD-10-CM | POA: Diagnosis not present

## 2023-07-02 DIAGNOSIS — R29898 Other symptoms and signs involving the musculoskeletal system: Secondary | ICD-10-CM | POA: Diagnosis not present

## 2023-07-02 DIAGNOSIS — M6281 Muscle weakness (generalized): Secondary | ICD-10-CM | POA: Diagnosis not present

## 2023-07-07 DIAGNOSIS — R29898 Other symptoms and signs involving the musculoskeletal system: Secondary | ICD-10-CM | POA: Diagnosis not present

## 2023-07-07 DIAGNOSIS — R2681 Unsteadiness on feet: Secondary | ICD-10-CM | POA: Diagnosis not present

## 2023-07-07 DIAGNOSIS — M6281 Muscle weakness (generalized): Secondary | ICD-10-CM | POA: Diagnosis not present

## 2023-07-09 DIAGNOSIS — R2681 Unsteadiness on feet: Secondary | ICD-10-CM | POA: Diagnosis not present

## 2023-07-09 DIAGNOSIS — M6281 Muscle weakness (generalized): Secondary | ICD-10-CM | POA: Diagnosis not present

## 2023-07-10 ENCOUNTER — Telehealth: Payer: Self-pay | Admitting: Hematology

## 2023-07-10 NOTE — Telephone Encounter (Signed)
"  [facility]" called to cancel appointments on behalf of the patient.

## 2023-07-14 DIAGNOSIS — R2681 Unsteadiness on feet: Secondary | ICD-10-CM | POA: Diagnosis not present

## 2023-07-14 DIAGNOSIS — M6281 Muscle weakness (generalized): Secondary | ICD-10-CM | POA: Diagnosis not present

## 2023-07-16 ENCOUNTER — Encounter: Payer: Self-pay | Admitting: Sports Medicine

## 2023-07-16 ENCOUNTER — Non-Acute Institutional Stay: Payer: Self-pay | Admitting: Sports Medicine

## 2023-07-16 DIAGNOSIS — Z66 Do not resuscitate: Secondary | ICD-10-CM

## 2023-07-16 DIAGNOSIS — K5901 Slow transit constipation: Secondary | ICD-10-CM | POA: Diagnosis not present

## 2023-07-16 DIAGNOSIS — R300 Dysuria: Secondary | ICD-10-CM | POA: Diagnosis not present

## 2023-07-16 DIAGNOSIS — R058 Other specified cough: Secondary | ICD-10-CM

## 2023-07-16 DIAGNOSIS — M6281 Muscle weakness (generalized): Secondary | ICD-10-CM | POA: Diagnosis not present

## 2023-07-16 DIAGNOSIS — I10 Essential (primary) hypertension: Secondary | ICD-10-CM | POA: Diagnosis not present

## 2023-07-16 DIAGNOSIS — R2681 Unsteadiness on feet: Secondary | ICD-10-CM | POA: Diagnosis not present

## 2023-07-16 NOTE — Progress Notes (Signed)
 Location:  [facility] Room Number: [id number]- [id number] Place of Service:  ALF (13) Provider:  Al Alias, MD  Patient Care Team: [name], MD as PCP - General (Family Medicine) Olinda Bertrand, DO as PCP - Cardiology (Cardiology) Reggy Capers as Physician Assistant (Dermatology)  Extended Emergency Contact Information Primary Emergency Contact: Yazmina, [place], [place] [address] [place] Home Phone: [phone] Work Phone: [phone] Mobile Phone: [phone] Relation: Son Secondary Emergency Contact: Rhody,[name]  [place] Phone: [phone] Relation: Son  Code Status:  DNR Goals of care: Advanced Directive information    05/22/2023   11:08 AM  Advanced Directives  Does Patient Have a Medical Advance Directive? No  Would patient like information on creating a medical advance directive? No - Guardian declined     Chief Complaint  Patient presents with   Abdominal Pain    HPI:  Pt is a 83 y.o. female seen today for an acute visit for lower abdominal pain.  The patient has been experiencing lower abdominal pain for the past two to three days, with concerns about a possible bladder infection . The pain is localized to the lower abdomen. She have noticed an increase in urinary frequency a few days ago, which has since decreased. There is no dysuria, but a slight burning sensation is noted when wiping. No hematuria or blood in the stool is reported. Pt c/o  dry cough for about four days, which is improving. There is no associated chest pain or dyspnea.  She has h/o constipation and had a bowel movement yesterday, reports passing hard stools.   Pt states that staff is not checking her blood pressure  and express a desire for more frequent monitoring.    Past Medical History:  Diagnosis Date   CKD (chronic kidney disease), stage II    nephrologist-- dr Ansel Kingdom  [facility])    Diabetes mellitus type 2, diet-controlled (HCC)    followed by pcp---   (09-20-2019  per pt does not check blood sugar at home)   Fracture of humeral shaft, right, closed 04/25/2022   GERD (gastroesophageal reflux disease)    History of primary hyperparathyroidism    s/p  parathyroidectomy right inferior on 12-02-2012   Humerus fracture 04/25/2022   Hyperlipidemia    Hypertension    followd by nephrologist----  (09-20-2019  per pt had stress test done some time ago , unsure when/ where, thinks told ok)   OA (osteoarthritis)    Osteoporosis    Prosthetic joint infection (HCC)    followed by dr j. hatcher (ID)   left total shoulder arthroplasty 12/ 2016  ; 12/ 2020  revision with explant joint replacement and hemiarthroplasty;  completed 6 month antibiotic 05/ 2021   Vertigo    Past Surgical History:  Procedure Laterality Date   COLONOSCOPY     DILATATION & CURETTAGE/HYSTEROSCOPY WITH MYOSURE N/A 09/28/2019   Procedure: DILATATION & CURETTAGE/HYSTEROSCOPY WITH MYOSURE;  Surgeon: Meriam Stamp, MD;  Location: [facility];  Service: Gynecology;  Laterality: N/A;   EYE SURGERY  yrs ago   laser surgery for retinal tear.  (unilateral , pt unsure which side)   ORIF HUMERUS FRACTURE Right 04/27/2022   Procedure: OPEN REDUCTION INTERNAL FIXATION (ORIF) PROXIMAL HUMERUS FRACTURE;  Surgeon: Micheline Ahr, MD;  Location: [facility];  Service: Orthopedics;  Laterality: Right;   PARATHYROIDECTOMY N/A 12/02/2012   Procedure: PARATHYROIDECTOMY with frozen  section ;  Surgeon: Keitha Pata, MD;  Location: [facility];  Service: General;  Laterality: N/A;   REVERSE SHOULDER ARTHROPLASTY Right 04/27/2022   Procedure: REVERSE SHOULDER ARTHROPLASTY;  Surgeon: Micheline Ahr, MD;  Location: [facility];  Service: Orthopedics;  Laterality: Right;   SHOULDER INJECTION Left 07/27/2013   Procedure: SHOULDER INJECTION;  Surgeon: Ferd Householder, MD;  Location: [facility];  Service: Orthopedics;  Laterality: Left;    STERIOD INJECTION Right 02/14/2015   Procedure: RIGHT SHOULDER CORTISONE INJECTION;  Surgeon: Ferd Householder, MD;  Location: [facility] OR;  Service: Orthopedics;  Laterality: Right;   TOTAL HIP ARTHROPLASTY Left 07/27/2013   Procedure: TOTAL HIP ARTHROPLASTY ANTERIOR APPROACH;  Surgeon: Ferd Householder, MD;  Location: [facility] OR;  Service: Orthopedics;  Laterality: Left;   TOTAL SHOULDER ARTHROPLASTY Left 02/14/2015   Procedure: LEFT TOTAL SHOULDER ARTHROPLASTY;  Surgeon: Ferd Householder, MD;  Location: [facility] OR;  Service: Orthopedics;  Laterality: Left;   TOTAL SHOULDER REVISION Left 02/09/2019   Procedure: TOTAL SHOULDER REVISION;  Surgeon: Micheline Ahr, MD;  Location: [facility];  Service: Orthopedics;  Laterality: Left;    Allergies  Allergen Reactions   Abaloparatide Other (See Comments)    "Burred vision, lightheaded" (patient does not recall this in 2024)   Mobic  [Meloxicam ] Hypertension   Crestor [Rosuvastatin] Other (See Comments)    Muscle aches   Norvasc [Amlodipine Besylate] Swelling and Other (See Comments)    Ankle swelling   Simvastatin Other (See Comments)    Muscle aches, high LFTs   Sulfa Antibiotics Rash   Zetia [Ezetimibe] Other (See Comments)    Muscle aches    Outpatient Encounter Medications as of 07/16/2023  Medication Sig   acetaminophen  (TYLENOL ) 325 MG tablet Take 650 mg by mouth every 4 (four) hours as needed for moderate pain (pain score 4-6).   allopurinol  (ZYLOPRIM ) 300 MG tablet Take 300 mg by mouth daily.   atorvastatin  (LIPITOR) 10 MG tablet Take 10 mg by mouth daily.   cephALEXin  (KEFLEX ) 250 MG capsule Take 250 mg by mouth at bedtime.   cloNIDine  (CATAPRES ) 0.1 MG tablet Take 0.1 mg by mouth daily as needed (for hypertension- if systolic b/p is over 160).   cyanocobalamin  1000 MCG tablet Take 1,000 mcg by mouth daily.   diclofenac Sodium (VOLTAREN) 1 % GEL Apply 2-4 g topically 4 (four) times daily.   docusate sodium  (COLACE) 100 MG capsule Take 100 mg by mouth 2 (two)  times daily.   famotidine  (PEPCID ) 20 MG tablet Take 1 tablet (20 mg total) by mouth 2 (two) times daily.   ferrous sulfate  220 (44 Fe) MG/5ML solution Take 7.5 mLs by mouth 3 (three) times a week. Monday, Wednesday, Friday   hydrALAZINE  (APRESOLINE ) 100 MG tablet Take 1 tablet (100 mg total) by mouth 3 (three) times daily.   hydrocortisone (ANUSOL-HC) 2.5 % rectal cream Place 1 Application rectally 2 (two) times daily.   pantoprazole  (PROTONIX ) 40 MG tablet Take 1 tablet (40 mg total) by mouth 2 (two) times daily.   polyethylene glycol powder (MIRALAX ) 17 GM/SCOOP powder Take 17 g by mouth daily. mix in 8 oz water .   potassium chloride  (KLOR-CON  M) 10 MEQ tablet Take 1 tablet (10 mEq total) by mouth daily.   Rivaroxaban  (XARELTO ) 15 MG TABS tablet Take 1 tablet (15 mg total) by mouth daily with supper.   senna-docusate (SENOKOT-S) 8.6-50 MG tablet Take 1 tablet by mouth 2 (two) times daily.   sucralfate  (CARAFATE ) 1  GM/10ML suspension Take 10 mLs (1 g total) by mouth 4 (four) times daily -  with meals and at bedtime. Give  1 hour before meals and at bedtime on an empt stomach   tamsulosin  (FLOMAX ) 0.4 MG CAPS capsule Take 1 capsule (0.4 mg total) by mouth daily.   Zinc Oxide 10 % OINT Apply 1 Application topically See admin instructions. 1 application to buttocks every shift for redness   meclizine  (ANTIVERT ) 12.5 MG tablet Take 1 tablet (12.5 mg total) by mouth 3 (three) times daily as needed for dizziness. (Patient not taking: Reported on 07/16/2023)   No facility-administered encounter medications on file as of 07/16/2023.    Review of Systems  Constitutional:  Negative for chills and fever.  HENT:  Negative for sinus pressure and sore throat.   Respiratory:  Positive for cough. Negative for shortness of breath and wheezing.   Cardiovascular:  Negative for chest pain, palpitations and leg swelling.  Gastrointestinal:  Positive for constipation. Negative for abdominal distention, abdominal  pain, blood in stool, diarrhea, nausea and vomiting.  Genitourinary:  Negative for dysuria, frequency, hematuria and urgency.  Neurological:  Negative for dizziness.    Immunization History  Administered Date(s) Administered   Influenza Split 11/24/2008   Influenza, High Dose Seasonal PF 03/16/2014, 01/14/2016, 01/21/2017, 02/24/2022   Influenza,inj,Quad PF,6+ Mos 11/20/2010, 02/01/2013   Influenza-Unspecified 11/20/2010, 02/01/2013, 02/19/2018, 02/24/2022   Moderna Sars-Covid-2 Vaccination 04/23/2019, 05/21/2019, 02/20/2020   Pneumococcal Conjugate-13 03/16/2014   Pneumococcal Polysaccharide-23 04/28/2005   Td 04/28/2005   Td (Adult) 04/28/2005   Tdap 06/08/2017   Zoster Recombinant(Shingrix) 01/14/2019   Zoster, Live 07/21/2007   Pertinent  Health Maintenance Due  Topic Date Due   OPHTHALMOLOGY EXAM  Never done   HEMOGLOBIN A1C  04/15/2023   FOOT EXAM  02/20/2024   DEXA SCAN  Completed   INFLUENZA VACCINE  Discontinued      04/12/2019    2:21 PM 07/19/2019    2:20 PM 09/28/2019    8:51 AM 06/21/2020    5:59 PM 09/23/2021    1:10 PM  Fall Risk  Falls in the past year? 0 0     (RETIRED) Patient Fall Risk Level Low fall risk Low fall risk Moderate fall risk Low fall risk Low fall risk  Fall risk Follow up Falls evaluation completed Falls evaluation completed      Functional Status Survey:    Vitals:   07/16/23 1005  BP: (!) 172/76  Pulse: 81  Temp: 97.8 F (36.6 C)  Weight: 121 lb 6.4 oz (55.1 kg)  Height: 5\' 4"  (1.626 m)   Body mass index is 20.84 kg/m. Physical Exam Constitutional:      Appearance: Normal appearance.  HENT:     Head: Normocephalic and atraumatic.  Cardiovascular:     Rate and Rhythm: Normal rate and regular rhythm.     Pulses: Normal pulses.     Heart sounds: Normal heart sounds.  Pulmonary:     Effort: Pulmonary effort is normal. No respiratory distress.     Breath sounds: Normal breath sounds. No stridor. No wheezing or rales.   Abdominal:     General: Bowel sounds are normal. There is no distension.     Palpations: Abdomen is soft.     Tenderness: There is no abdominal tenderness. There is no guarding or rebound.     Comments:    Musculoskeletal:        General: No swelling.  Neurological:     Mental Status:  She is alert. Mental status is at baseline.     Motor: No weakness.     Labs reviewed: Recent Labs    01/06/23 0926 01/07/23 0425 01/08/23 0413 01/16/23 0000 05/24/23 0701 05/25/23 0603 05/26/23 0540 06/02/23 0000 06/18/23 0000  NA 134* 134* 135   < > 133* 132* 133* 137 131*  K 3.6 3.6 3.1*   < > 3.7 3.5 3.3* 3.0* 3.4*  CL 106 106 106   < > 103 104 106 107 105  CO2 22 19* 22   < > 21* 22 20* 24* 23*  GLUCOSE 115* 89 95   < > 96 104* 93  --   --   BUN 14 13 13    < > 12 10 10 14 12   CREATININE 0.62 0.65 0.61   < > 0.80 0.79 0.67 0.6 0.9  CALCIUM  9.9 9.2 9.1   < > 9.7 9.2 9.3 9.8 9.9  MG 1.9 2.2 2.4   < > 1.4* 2.1 1.7  --   --   PHOS 2.0* 2.6 2.2*  --   --   --   --   --   --    < > = values in this interval not displayed.   Recent Labs    12/28/22 1256 01/01/23 0416 01/08/23 0413 01/16/23 0000 03/03/23 0000 05/22/23 1116 06/18/23 0000  AST 17  --  17   < > 12* 16 9*  ALT 14  --  11   < > 5* 7 5*  ALKPHOS 85  --  64   < > 76 77 89  BILITOT 0.8  --  0.6  --   --  0.8  --   PROT 6.5  --  5.6*  --   --  6.1*  --   ALBUMIN  3.0*   < > 2.7*   < > 3.2* 2.8* 3.3*   < > = values in this interval not displayed.   Recent Labs    05/22/23 1116 05/23/23 0622 05/24/23 0701 05/25/23 0603 05/26/23 0540 06/02/23 0000 06/18/23 0000  WBC 8.8   < > 7.6 6.7 6.8 4.8 4.9  NEUTROABS 6.3  --   --   --   --  2,568.00 2,891.00  HGB 10.7*   < > 10.7* 9.6* 9.2* 9.6* 10.8*  HCT 34.2*   < > 33.2* 29.4* 28.8* 30* 33*  MCV 86.8   < > 84.3 83.1 84.7  --   --   PLT 211   < > 206 183 181 211 198   < > = values in this interval not displayed.   Lab Results  Component Value Date   TSH 1.438 05/22/2023    Lab Results  Component Value Date   HGBA1C 6.0 (H) 10/13/2022   Lab Results  Component Value Date   CHOL 121 10/13/2022   HDL 44 10/13/2022   LDLCALC 62 10/13/2022   TRIG 74 10/13/2022   CHOLHDL 2.8 10/13/2022    Significant Diagnostic Results in last 30 days:  No results found.  Assessment/Plan  1. Primary hypertension (Primary) Pt denies headache, dizzy or lightheadedness Pt has isolated high bp readings Will instruct the staff to check BP daily around the same time Will follow up in a week Avoid salty foods  2. Slow transit constipation Increase oral hydration and fiber intake Cont with colace, senna Cont with miralax    3. Dry cough Probably related to GERD Cont with famotidine  and protonix    4. Dysuria Pt  reports that her symptoms improved She wants to have her urine checked Pt is currently on abx ppx as per urology Will check UA , culture  Other orders - CBC and differential - CBC - Basic metabolic panel with GFR - Comprehensive metabolic panel with GFR - Hepatic function panel - hydrocortisone (ANUSOL-HC) 2.5 % rectal cream; Place 1 Application rectally 2 (two) times daily.    30 min Total time spent for obtaining history,  performing a medically appropriate examination and evaluation, reviewing the tests,   documenting clinical information in the electronic or other health record,  ,care coordination (not separately reported)

## 2023-07-17 ENCOUNTER — Encounter: Payer: Self-pay | Admitting: Sports Medicine

## 2023-07-17 DIAGNOSIS — R300 Dysuria: Secondary | ICD-10-CM | POA: Diagnosis not present

## 2023-07-21 ENCOUNTER — Other Ambulatory Visit

## 2023-07-21 ENCOUNTER — Ambulatory Visit: Admitting: Hematology

## 2023-07-21 DIAGNOSIS — M6281 Muscle weakness (generalized): Secondary | ICD-10-CM | POA: Diagnosis not present

## 2023-07-21 DIAGNOSIS — R2681 Unsteadiness on feet: Secondary | ICD-10-CM | POA: Diagnosis not present

## 2023-07-23 ENCOUNTER — Non-Acute Institutional Stay: Payer: Self-pay | Admitting: Nurse Practitioner

## 2023-07-23 ENCOUNTER — Encounter: Payer: Self-pay | Admitting: Nurse Practitioner

## 2023-07-23 DIAGNOSIS — Z8673 Personal history of transient ischemic attack (TIA), and cerebral infarction without residual deficits: Secondary | ICD-10-CM

## 2023-07-23 DIAGNOSIS — H6122 Impacted cerumen, left ear: Secondary | ICD-10-CM | POA: Diagnosis not present

## 2023-07-23 DIAGNOSIS — N390 Urinary tract infection, site not specified: Secondary | ICD-10-CM

## 2023-07-23 DIAGNOSIS — E876 Hypokalemia: Secondary | ICD-10-CM

## 2023-07-23 DIAGNOSIS — I10 Essential (primary) hypertension: Secondary | ICD-10-CM

## 2023-07-23 DIAGNOSIS — M109 Gout, unspecified: Secondary | ICD-10-CM

## 2023-07-23 DIAGNOSIS — Z87898 Personal history of other specified conditions: Secondary | ICD-10-CM

## 2023-07-23 DIAGNOSIS — K2100 Gastro-esophageal reflux disease with esophagitis, without bleeding: Secondary | ICD-10-CM | POA: Diagnosis not present

## 2023-07-23 DIAGNOSIS — R2681 Unsteadiness on feet: Secondary | ICD-10-CM | POA: Diagnosis not present

## 2023-07-23 DIAGNOSIS — M6281 Muscle weakness (generalized): Secondary | ICD-10-CM | POA: Diagnosis not present

## 2023-07-23 DIAGNOSIS — I4891 Unspecified atrial fibrillation: Secondary | ICD-10-CM | POA: Diagnosis not present

## 2023-07-23 DIAGNOSIS — D509 Iron deficiency anemia, unspecified: Secondary | ICD-10-CM | POA: Diagnosis not present

## 2023-07-23 NOTE — Progress Notes (Signed)
 Location:  [facility] Room Number: AL801-A Place of Service:  ALF [phone]) Provider:  Andra Kava, MD  Patient Care Team: [name], MD as PCP - General (Family Medicine) Olinda Bertrand, DO as PCP - Cardiology (Cardiology) Reggy Capers as Physician Assistant (Dermatology)  Extended Emergency Contact Information Primary Emergency Contact: Anadia, [place], [place] [address] [place] Home Phone: [phone] Work Phone: [phone] Mobile Phone: [phone] Relation: Son Secondary Emergency Contact: Salmi,[name]  [place] Phone: [phone] Relation: Son  Code Status:  DNR Goals of care: Advanced Directive information    07/23/2023   11:25 AM  Advanced Directives  Does Patient Have a Medical Advance Directive? Yes  Type of Estate agent of [place];Living will  Does patient want to make changes to medical advance directive? No - Patient declined  Copy of Healthcare Power of Attorney in Chart? Yes - validated most recent copy scanned in chart (See row information)     Chief Complaint  Patient presents with   Acute Visit    Ear pain.    HPI:  Pt is a 83 y.o. female seen today for an acute visit for c/o left ear pain     hemorrhoidal bleed, blood coming after BM, no pain complained.                Hospitalized 05/22/23-05/26/23 for AMS, f/u CBC/BMP. Negative CT head/MRI brain and metabolic workup and UA, treated with Rocephin  x3 days anyway. Unclear etiology.                            05/22/23 the patient was found in recliner in near fetus position, repetitively saying put my legs down, but yelling out when assistance attempted. The patient stated she cannot open her eyes when asked to. The patient is oriented to person, place. No noted focal weakness. She is afebrile, no O2 desaturation.                           Hx of UTI, on Keflex  for suppression  therapy.              Hypokalemia, serum K 2.7 04/17/23<<3.4 06/18/23, on  Kcl supplement  Hyponatremia, Na 137 06/18/23             GERD, on  Voquezna per GI 03/27/23  03/27/23 GI dc'd Pantoprazole , Famotidine , Carafage 04/01/23 f/u GI, dc'd Voquenza, restarted Pantoprazole , Famotidine , Sucralfate . Followed by GI, MRI and barium swallow study.               Anemia, added Vit B12(Vit B12 273 12/10/22) and Fe 01/15/23. 01/30/23 Cardiology dc'd ASA, Hgb 10.8 06/18/23             New onset Atrial Fibrillation -2D echo from 10/13/2022 with a EF of 60 to 65%, grade 1 DD and mildly dilated left atrium. -Patient with history of bradycardia arrhythmia for which she was sent to cardiology in May 2024. -Patient felt not a great candidate for long-term anticoagulation but fall risk now is lower due to debility per son. -Patient placed on Xarelto  which is preferred medication per family. -Patient has been placed on full dose Lovenox  in anticipation of bone marrow biopsy which was done 01/06/2023.   -Resuming Rivaroxaban  01/07/2023. 03/11/22 cardiology H&H, BMP q 6months  TSH 1.438 05/22/23              Saw oncology for MGUS, negative cone marrow biopsy, moderate risk of progression to IgM myeloma or [place] macroglobulinemia(19-37% in next 20 years), f/u.              Hydronephrosis: f/u urology for renal mass left 1.9x1.6cm             Hospitalized 12/22/22 for metabolic encephalopathy 2/2 to UTI, AKI, and hypercalcemia.              Dysphagia, f/u ST             Hospitalized 10/12/22-10/17/22 UTI, encephalopathy, acute CVA             OA R+L knee pain, f/u Ortho for Gel inj, ambulates with walker.              CVA resolved difficulty word finding, off Statin, ASA, f/u stroke clinic, MRI small punctate infarct in the posteerior limb of R internal capsule, no focal weakness             Gout, stable, takes Allopurinol              HLD, the patient desires to resume Atorvastatin  and denied any allergic reaction,  LDL 62 10/13/22, LDL 69 04/02/23, f/u lipid panel 6 months.              HTN, on Hydralazine ,  prn Clonidine              CXR 10/14/22 pulm edema/volume overload, not apparent, off prn Furosemide , BNP 950.8 10/14/22, Echo 60-65% EF 10/13/22             Constipation, stable, on Senna, MiraLax              Prediabetes Hgb A1c 6.0 10/13/22, TSH 1.293 10/12/22, ACR 242(<30)02/26/23             Positive anti CCP test, f/u Rheumatology              Bradycardia, Hx of, off Carvedilol               Hx of urinary retention, taking Tamsulosin .     Past Medical History:  Diagnosis Date   CKD (chronic kidney disease), stage II    nephrologist-- dr Ansel Kingdom  [facility])   Diabetes mellitus type 2, diet-controlled (HCC)    followed by pcp---   (09-20-2019  per pt does not check blood sugar at home)   Fracture of humeral shaft, right, closed 04/25/2022   GERD (gastroesophageal reflux disease)    History of primary hyperparathyroidism    s/p  parathyroidectomy right inferior on 12-02-2012   Humerus fracture 04/25/2022   Hyperlipidemia    Hypertension    followd by nephrologist----  (09-20-2019  per pt had stress test done some time ago , unsure when/ where, thinks told ok)   OA (osteoarthritis)    Osteoporosis    Prosthetic joint infection (HCC)    followed by dr j. hatcher (ID)   left total shoulder arthroplasty 12/ 2016  ; 12/ 2020  revision with explant joint replacement and hemiarthroplasty;  completed 6 month antibiotic 05/ 2021   Vertigo    Past Surgical History:  Procedure Laterality Date   COLONOSCOPY     DILATATION & CURETTAGE/HYSTEROSCOPY WITH MYOSURE N/A 09/28/2019   Procedure: DILATATION & CURETTAGE/HYSTEROSCOPY WITH MYOSURE;  Surgeon: Meriam Stamp, MD;  Location: [facility];  Service: Gynecology;  Laterality: N/A;  EYE SURGERY  yrs ago   laser surgery for retinal tear.  (unilateral , pt unsure which side)   ORIF HUMERUS FRACTURE Right 04/27/2022   Procedure: OPEN  REDUCTION INTERNAL FIXATION (ORIF) PROXIMAL HUMERUS FRACTURE;  Surgeon: Micheline Ahr, MD;  Location: [facility];  Service: Orthopedics;  Laterality: Right;   PARATHYROIDECTOMY N/A 12/02/2012   Procedure: PARATHYROIDECTOMY with frozen section ;  Surgeon: Keitha Pata, MD;  Location: [facility];  Service: General;  Laterality: N/A;   REVERSE SHOULDER ARTHROPLASTY Right 04/27/2022   Procedure: REVERSE SHOULDER ARTHROPLASTY;  Surgeon: Micheline Ahr, MD;  Location: [facility];  Service: Orthopedics;  Laterality: Right;   SHOULDER INJECTION Left 07/27/2013   Procedure: SHOULDER INJECTION;  Surgeon: Ferd Householder, MD;  [facility];  Service: Orthopedics;  Laterality: Left;   STERIOD INJECTION Right 02/14/2015   Procedure: RIGHT SHOULDER CORTISONE INJECTION;  Surgeon: Ferd Householder, MD;  Location: [facility] OR;  Service: Orthopedics;  Laterality: Right;   TOTAL HIP ARTHROPLASTY Left 07/27/2013   Procedure: TOTAL HIP ARTHROPLASTY ANTERIOR APPROACH;  Surgeon: Ferd Householder, MD;  Location: [facility] OR;  Service: Orthopedics;  Laterality: Left;   TOTAL SHOULDER ARTHROPLASTY Left 02/14/2015   Procedure: LEFT TOTAL SHOULDER ARTHROPLASTY;  Surgeon: Ferd Householder, MD;  Location: [facility] OR;  Service: Orthopedics;  Laterality: Left;   TOTAL SHOULDER REVISION Left 02/09/2019   Procedure: TOTAL SHOULDER REVISION;  Surgeon: Micheline Ahr, MD;  Location: [facility];  Service: Orthopedics;  Laterality: Left;    Allergies  Allergen Reactions   Abaloparatide Other (See Comments)    "Burred vision, lightheaded" (patient does not recall this in 2024)   Mobic  [Meloxicam ] Hypertension   Crestor [Rosuvastatin] Other (See Comments)    Muscle aches   Norvasc [Amlodipine Besylate] Swelling and Other (See Comments)    Ankle swelling   Simvastatin Other (See Comments)    Muscle aches, high LFTs   Sulfa Antibiotics Rash   Zetia [Ezetimibe] Other (See Comments)    Muscle aches    Outpatient Encounter Medications as of 07/23/2023  Medication Sig    acetaminophen  (TYLENOL ) 325 MG tablet Take 650 mg by mouth every 4 (four) hours as needed for moderate pain (pain score 4-6).   allopurinol  (ZYLOPRIM ) 300 MG tablet Take 300 mg by mouth daily.   atorvastatin  (LIPITOR) 10 MG tablet Take 10 mg by mouth daily.   cephALEXin  (KEFLEX ) 250 MG capsule Take 250 mg by mouth at bedtime.   cloNIDine  (CATAPRES ) 0.1 MG tablet Take 0.1 mg by mouth daily as needed (for hypertension- if systolic b/p is over 160).   cyanocobalamin  1000 MCG tablet Take 1,000 mcg by mouth daily.   diclofenac Sodium (VOLTAREN) 1 % GEL Apply 2-4 g topically 4 (four) times daily.   docusate sodium  (COLACE) 100 MG capsule Take 100 mg by mouth 2 (two) times daily.   famotidine  (PEPCID ) 20 MG tablet Take 1 tablet (20 mg total) by mouth 2 (two) times daily.   ferrous sulfate  220 (44 Fe) MG/5ML solution Take 7.5 mLs by mouth 3 (three) times a week. Monday, Wednesday, Friday   hydrALAZINE  (APRESOLINE ) 100 MG tablet Take 1 tablet (100 mg total) by mouth 3 (three) times daily.   hydrocortisone (ANUSOL-HC) 2.5 % rectal cream Place 1 Application rectally 2 (two) times daily.   pantoprazole  (PROTONIX ) 40 MG tablet Take 1 tablet (40 mg total) by mouth 2 (two) times daily.   polyethylene glycol powder (MIRALAX ) 17 GM/SCOOP powder Take 17 g by mouth  daily. mix in 8 oz water .   potassium chloride  (KLOR-CON  M) 10 MEQ tablet Take 1 tablet (10 mEq total) by mouth daily.   Rivaroxaban  (XARELTO ) 15 MG TABS tablet Take 1 tablet (15 mg total) by mouth daily with supper.   senna-docusate (SENOKOT-S) 8.6-50 MG tablet Take 1 tablet by mouth 2 (two) times daily.   sucralfate  (CARAFATE ) 1 GM/10ML suspension Take 10 mLs (1 g total) by mouth 4 (four) times daily -  with meals and at bedtime. Give  1 hour before meals and at bedtime on an empt stomach   tamsulosin  (FLOMAX ) 0.4 MG CAPS capsule Take 1 capsule (0.4 mg total) by mouth daily.   Zinc Oxide 10 % OINT Apply 1 Application topically See admin instructions.  1 application to buttocks every shift for redness   meclizine  (ANTIVERT ) 12.5 MG tablet Take 1 tablet (12.5 mg total) by mouth 3 (three) times daily as needed for dizziness. (Patient not taking: Reported on 07/16/2023)   No facility-administered encounter medications on file as of 07/23/2023.    Review of Systems  Immunization History  Administered Date(s) Administered   Influenza Split 11/24/2008   Influenza, High Dose Seasonal PF 03/16/2014, 01/14/2016, 01/21/2017, 02/24/2022   Influenza,inj,Quad PF,6+ Mos 11/20/2010, 02/01/2013   Influenza-Unspecified 11/20/2010, 02/01/2013, 02/19/2018, 02/24/2022   Moderna Sars-Covid-2 Vaccination 04/23/2019, 05/21/2019, 02/20/2020   Pneumococcal Conjugate-13 03/16/2014   Pneumococcal Polysaccharide-23 04/28/2005   Td 04/28/2005   Td (Adult) 04/28/2005   Tdap 06/08/2017   Zoster Recombinant(Shingrix) 01/14/2019   Zoster, Live 07/21/2007   Pertinent  Health Maintenance Due  Topic Date Due   OPHTHALMOLOGY EXAM  Never done   HEMOGLOBIN A1C  04/15/2023   FOOT EXAM  02/20/2024   DEXA SCAN  Completed   INFLUENZA VACCINE  Discontinued      04/12/2019    2:21 PM 07/19/2019    2:20 PM 09/28/2019    8:51 AM 06/21/2020    5:59 PM 09/23/2021    1:10 PM  Fall Risk  Falls in the past year? 0 0     (RETIRED) Patient Fall Risk Level Low fall risk Low fall risk Moderate fall risk Low fall risk Low fall risk  Fall risk Follow up Falls evaluation completed Falls evaluation completed      Functional Status Survey:    Vitals:   07/23/23 1122 07/23/23 1123  BP: (!) 142/68 (!) 166/79  Pulse: 100   Resp: 20   Temp: 97.6 F (36.4 C)   SpO2: 97%   Weight: 121 lb 6.4 oz (55.1 kg)   Height: 5\' 4"  (1.626 m)    Body mass index is 20.84 kg/m. Physical Exam  Labs reviewed: Recent Labs    01/06/23 0926 01/07/23 0425 01/08/23 0413 01/16/23 0000 05/24/23 0701 05/25/23 0603 05/26/23 0540 06/02/23 0000 06/18/23 0000  NA 134* 134* 135   < > 133* 132*  133* 137 131*  K 3.6 3.6 3.1*   < > 3.7 3.5 3.3* 3.0* 3.4*  CL 106 106 106   < > 103 104 106 107 105  CO2 22 19* 22   < > 21* 22 20* 24* 23*  GLUCOSE 115* 89 95   < > 96 104* 93  --   --   BUN 14 13 13    < > 12 10 10 14 12   CREATININE 0.62 0.65 0.61   < > 0.80 0.79 0.67 0.6 0.9  CALCIUM  9.9 9.2 9.1   < > 9.7 9.2 9.3 9.8 9.9  MG 1.9  2.2 2.4   < > 1.4* 2.1 1.7  --   --   PHOS 2.0* 2.6 2.2*  --   --   --   --   --   --    < > = values in this interval not displayed.   Recent Labs    12/28/22 1256 01/01/23 0416 01/08/23 0413 01/16/23 0000 03/03/23 0000 05/22/23 1116 06/18/23 0000  AST 17  --  17   < > 12* 16 9*  ALT 14  --  11   < > 5* 7 5*  ALKPHOS 85  --  64   < > 76 77 89  BILITOT 0.8  --  0.6  --   --  0.8  --   PROT 6.5  --  5.6*  --   --  6.1*  --   ALBUMIN  3.0*   < > 2.7*   < > 3.2* 2.8* 3.3*   < > = values in this interval not displayed.   Recent Labs    05/22/23 1116 05/23/23 0622 05/24/23 0701 05/25/23 0603 05/26/23 0540 06/02/23 0000 06/18/23 0000  WBC 8.8   < > 7.6 6.7 6.8 4.8 4.9  NEUTROABS 6.3  --   --   --   --  2,568.00 2,891.00  HGB 10.7*   < > 10.7* 9.6* 9.2* 9.6* 10.8*  HCT 34.2*   < > 33.2* 29.4* 28.8* 30* 33*  MCV 86.8   < > 84.3 83.1 84.7  --   --   PLT 211   < > 206 183 181 211 198   < > = values in this interval not displayed.   Lab Results  Component Value Date   TSH 1.438 05/22/2023   Lab Results  Component Value Date   HGBA1C 6.0 (H) 10/13/2022   Lab Results  Component Value Date   CHOL 121 10/13/2022   HDL 44 10/13/2022   LDLCALC 62 10/13/2022   TRIG 74 10/13/2022   CHOLHDL 2.8 10/13/2022    Significant Diagnostic Results in last 30 days:  No results found.  Assessment/Plan No problem-specific Assessment & Plan notes found for this encounter.     Family/ staff Communication: plan of care reviewed with the patient and charge nurse.   Labs/tests ordered:  none

## 2023-07-24 ENCOUNTER — Encounter: Payer: Self-pay | Admitting: Nurse Practitioner

## 2023-07-24 DIAGNOSIS — H6122 Impacted cerumen, left ear: Secondary | ICD-10-CM | POA: Insufficient documentation

## 2023-07-24 NOTE — Assessment & Plan Note (Signed)
 Intermittent elevated Sbp,  on Hydralazine,  prn Clonidine

## 2023-07-24 NOTE — Assessment & Plan Note (Signed)
 Hx of urinary retention, taking Tamsulosin.

## 2023-07-24 NOTE — Assessment & Plan Note (Signed)
 CVA resolved difficulty word finding, off Statin, ASA, f/u stroke clinic, MRI small punctate infarct in the posteerior limb of R internal capsule, no focal weakness

## 2023-07-24 NOTE — Assessment & Plan Note (Signed)
 Pantoprazole, Famotidine, Sucralfate. Followed by GI, MRI and barium swallow study.

## 2023-07-24 NOTE — Assessment & Plan Note (Signed)
 c/o left ear impacted ear wax, feels stuffy, cannot hear as well as prior. Denied pain. Debrox 5gtt nightly x 3 days, then ear lavage.

## 2023-07-24 NOTE — Assessment & Plan Note (Signed)
 added Vit B12(Vit B12 273 12/10/22) and Fe 01/15/23. 01/30/23 Cardiology dc'd ASA, Hgb 10.8 06/18/23

## 2023-07-24 NOTE — Assessment & Plan Note (Signed)
 serum K 2.7 04/17/23<<3.4 06/18/23, on  Kcl supplement

## 2023-07-24 NOTE — Assessment & Plan Note (Signed)
 stable, takes Allopurinol.

## 2023-07-24 NOTE — Assessment & Plan Note (Signed)
 c/o left ear impacted ear wax, feels stuffy, cannot hear as well as prior. Denied pain

## 2023-07-24 NOTE — Assessment & Plan Note (Signed)
 Heart rate is in contol, continue Rivaroxaban 

## 2023-07-28 DIAGNOSIS — M6281 Muscle weakness (generalized): Secondary | ICD-10-CM | POA: Diagnosis not present

## 2023-07-28 DIAGNOSIS — R2681 Unsteadiness on feet: Secondary | ICD-10-CM | POA: Diagnosis not present

## 2023-07-30 DIAGNOSIS — R2681 Unsteadiness on feet: Secondary | ICD-10-CM | POA: Diagnosis not present

## 2023-07-30 DIAGNOSIS — M6281 Muscle weakness (generalized): Secondary | ICD-10-CM | POA: Diagnosis not present

## 2023-08-04 DIAGNOSIS — I10 Essential (primary) hypertension: Secondary | ICD-10-CM | POA: Diagnosis not present

## 2023-08-04 DIAGNOSIS — E119 Type 2 diabetes mellitus without complications: Secondary | ICD-10-CM | POA: Diagnosis not present

## 2023-08-04 DIAGNOSIS — D3502 Benign neoplasm of left adrenal gland: Secondary | ICD-10-CM | POA: Diagnosis not present

## 2023-08-04 DIAGNOSIS — Z6827 Body mass index (BMI) 27.0-27.9, adult: Secondary | ICD-10-CM | POA: Diagnosis not present

## 2023-08-05 DIAGNOSIS — M6281 Muscle weakness (generalized): Secondary | ICD-10-CM | POA: Diagnosis not present

## 2023-08-05 DIAGNOSIS — R2681 Unsteadiness on feet: Secondary | ICD-10-CM | POA: Diagnosis not present

## 2023-08-06 ENCOUNTER — Encounter: Payer: Self-pay | Admitting: Nurse Practitioner

## 2023-08-06 ENCOUNTER — Non-Acute Institutional Stay: Payer: Self-pay | Admitting: Sports Medicine

## 2023-08-06 DIAGNOSIS — R051 Acute cough: Secondary | ICD-10-CM

## 2023-08-06 DIAGNOSIS — E782 Mixed hyperlipidemia: Secondary | ICD-10-CM

## 2023-08-06 DIAGNOSIS — I4891 Unspecified atrial fibrillation: Secondary | ICD-10-CM

## 2023-08-06 DIAGNOSIS — K219 Gastro-esophageal reflux disease without esophagitis: Secondary | ICD-10-CM

## 2023-08-06 DIAGNOSIS — R2681 Unsteadiness on feet: Secondary | ICD-10-CM | POA: Diagnosis not present

## 2023-08-06 DIAGNOSIS — I10 Essential (primary) hypertension: Secondary | ICD-10-CM | POA: Diagnosis not present

## 2023-08-06 DIAGNOSIS — R918 Other nonspecific abnormal finding of lung field: Secondary | ICD-10-CM | POA: Diagnosis not present

## 2023-08-06 DIAGNOSIS — J9809 Other diseases of bronchus, not elsewhere classified: Secondary | ICD-10-CM | POA: Diagnosis not present

## 2023-08-06 DIAGNOSIS — R799 Abnormal finding of blood chemistry, unspecified: Secondary | ICD-10-CM | POA: Diagnosis not present

## 2023-08-06 DIAGNOSIS — M6281 Muscle weakness (generalized): Secondary | ICD-10-CM | POA: Diagnosis not present

## 2023-08-06 DIAGNOSIS — F0390 Unspecified dementia without behavioral disturbance: Secondary | ICD-10-CM | POA: Diagnosis not present

## 2023-08-06 DIAGNOSIS — R059 Cough, unspecified: Secondary | ICD-10-CM | POA: Insufficient documentation

## 2023-08-06 NOTE — Progress Notes (Addendum)
 Provider:  Dr. Jackalyn Blazing Location:  [facility] Place of Service:   Assisted living   PCP: Cleotilde Planas, MD Patient Care Team: Cleotilde Planas, MD as PCP - General (Family Medicine) Michele Richardson, DO as PCP - Cardiology (Cardiology) Porter Andrez SAUNDERS, PA-C as Physician Assistant (Dermatology)  Extended Emergency Contact Information Primary Emergency Contact: Regnia, [place], [place] [address] [place] Home Phone: [phone] Work Phone: [phone] Mobile Phone: [phone] Relation: Son Secondary Emergency Contact: Feldhaus,[name]  [place] Mobile Phone: [phone] Relation: Son  Goals of Care: Advanced Directive information    07/23/2023   11:25 AM  Advanced Directives  Does Patient Have a Medical Advance Directive? Yes  Type of Estate agent of [place];Living will  Does patient want to make changes to medical advance directive? No - Patient declined  Copy of Healthcare Power of Attorney in Chart? Yes - validated most recent copy scanned in chart (See row information)        History of Present Illness  83 year old female with a history of dementia, hyponatremia, hypokalemia, A-fib, history of CVA is evaluated for acute visit for cough Pt seen and examined in her room  She is sitting in recliner chair C/o cough since few days , bringing whitish phlegm  Denies SOB  Able to speak in full sentences Pt denies runny nose, congestion, sore throat ,mylagias   Past Medical History:  Diagnosis Date   CKD (chronic kidney disease), stage II    nephrologist-- dr prescilla  [facility])   Diabetes mellitus type 2, diet-controlled (HCC)    followed by pcp---   (09-20-2019  per pt does not check blood sugar at home)   Fracture of humeral shaft, right, closed 04/25/2022   GERD (gastroesophageal reflux disease)    History of primary hyperparathyroidism    s/p  parathyroidectomy right  inferior on 12-02-2012   Humerus fracture 04/25/2022   Hyperlipidemia    Hypertension    followd by nephrologist----  (09-20-2019  per pt had stress test done some time ago , unsure when/ where, thinks told ok)   OA (osteoarthritis)    Osteoporosis    Prosthetic joint infection (HCC)    followed by dr j. hatcher (ID)   left total shoulder arthroplasty 12/ 2016  ; 12/ 2020  revision with explant joint replacement and hemiarthroplasty;  completed 6 month antibiotic 05/ 2021   Vertigo    Past Surgical History:  Procedure Laterality Date   COLONOSCOPY     DILATATION & CURETTAGE/HYSTEROSCOPY WITH MYOSURE N/A 09/28/2019   Procedure: DILATATION & CURETTAGE/HYSTEROSCOPY WITH MYOSURE;  Surgeon: Gorge Ade, MD;  Location: [facility];  Service: Gynecology;  Laterality: N/A;   EYE SURGERY  yrs ago   laser surgery for retinal tear.  (unilateral , pt unsure which side)   ORIF HUMERUS FRACTURE Right 04/27/2022   Procedure: OPEN REDUCTION INTERNAL FIXATION (ORIF) PROXIMAL HUMERUS FRACTURE;  Surgeon: Cristy Bonner DASEN, MD;  Location: [facility];  Service: Orthopedics;  Laterality: Right;   PARATHYROIDECTOMY N/A 12/02/2012   Procedure: PARATHYROIDECTOMY with frozen section ;  Surgeon: Krystal CHRISTELLA Spinner, MD;  Location: [facility];  Service: General;  Laterality: N/A;   REVERSE SHOULDER ARTHROPLASTY Right 04/27/2022   Procedure: REVERSE SHOULDER ARTHROPLASTY;  Surgeon: Cristy Bonner DASEN, MD;  Location: [facility];  Service: Orthopedics;  Laterality: Right;   SHOULDER INJECTION Left 07/27/2013   Procedure: SHOULDER INJECTION;  Surgeon: Toribio FALCON  Beverley, MD;  Location: [facility] OR;  Service: Orthopedics;  Laterality: Left;   STERIOD INJECTION Right 02/14/2015   Procedure: RIGHT SHOULDER CORTISONE INJECTION;  Surgeon: Toribio JULIANNA Beverley, MD;  Location: [facility] OR;  Service: Orthopedics;  Laterality: Right;   TOTAL HIP ARTHROPLASTY Left 07/27/2013   Procedure: TOTAL HIP ARTHROPLASTY ANTERIOR APPROACH;  Surgeon: Toribio JULIANNA Beverley, MD;   Location: [facility] OR;  Service: Orthopedics;  Laterality: Left;   TOTAL SHOULDER ARTHROPLASTY Left 02/14/2015   Procedure: LEFT TOTAL SHOULDER ARTHROPLASTY;  Surgeon: Toribio JULIANNA Beverley, MD;  Location: [facility] OR;  Service: Orthopedics;  Laterality: Left;   TOTAL SHOULDER REVISION Left 02/09/2019   Procedure: TOTAL SHOULDER REVISION;  Surgeon: Cristy Bonner DASEN, MD;  Location: [facility];  Service: Orthopedics;  Laterality: Left;    reports that she quit smoking about 35 years ago. Her smoking use included cigarettes. She started smoking about 55 years ago. She has a 20 pack-year smoking history. She has never used smokeless tobacco. She reports that she does not drink alcohol and does not use drugs. Social History   Socioeconomic History   Marital status: Widowed    Spouse name: Not on file   Number of children: Not on file   Years of education: Not on file   Highest education level: Not on file  Occupational History   Not on file  Tobacco Use   Smoking status: Former    Current packs/day: 0.00    Average packs/day: 1 pack/day for 20.0 years (20.0 ttl pk-yrs)    Types: Cigarettes    Start date: 10/28/1967    Quit date: 10/28/1987    Years since quitting: 35.7   Smokeless tobacco: Never  Vaping Use   Vaping status: Never Used  Substance and Sexual Activity   Alcohol use: No   Drug use: Never   Sexual activity: Yes    Birth control/protection: Post-menopausal  Other Topics Concern   Not on file  Social History Narrative   Not on file   Social Drivers of Health   Financial Resource Strain: Not on file  Food Insecurity: No Food Insecurity (05/22/2023)   Hunger Vital Sign    Worried About Running Out of Food in the Last Year: Never true    Ran Out of Food in the Last Year: Never true  Transportation Needs: No Transportation Needs (05/22/2023)   PRAPARE - Administrator, Civil Service (Medical): No    Lack of Transportation (Non-Medical): No  Physical Activity: Not on file  Stress: Not  on file  Social Connections: Unknown (05/22/2023)   Social Connection and Isolation Panel [NHANES]    Frequency of Communication with Friends and Family: Patient unable to answer    Frequency of Social Gatherings with Friends and Family: Patient unable to answer    Attends Religious Services: Patient unable to answer    Active Member of Clubs or Organizations: Patient unable to answer    Attends Banker Meetings: Patient unable to answer    Marital Status: Widowed  Intimate Partner Violence: Not At Risk (05/22/2023)   Humiliation, Afraid, Rape, and Kick questionnaire    Fear of Current or Ex-Partner: No    Emotionally Abused: No    Physically Abused: No    Sexually Abused: No    Functional Status Survey:    Family History  Problem Relation Age of Onset   Heart failure Mother    Cancer Mother    Breast cancer Mother  in her 107s   Cancer Father     Health Maintenance  Topic Date Due   OPHTHALMOLOGY EXAM  Never done   HEMOGLOBIN A1C  04/15/2023   FOOT EXAM  02/20/2024   Medicare Annual Wellness (AWV)  02/20/2024   Diabetic kidney evaluation - Urine ACR  02/26/2024   Diabetic kidney evaluation - eGFR measurement  06/17/2024   DTaP/Tdap/Td (4 - Td or Tdap) 06/09/2027   Pneumonia Vaccine 32+ Years old  Completed   DEXA SCAN  Completed   HPV VACCINES  Aged Out   Meningococcal B Vaccine  Aged Out   INFLUENZA VACCINE  Discontinued   COVID-19 Vaccine  Discontinued   Zoster Vaccines- Shingrix  Discontinued    Allergies  Allergen Reactions   Abaloparatide Other (See Comments)    Burred vision, lightheaded (patient does not recall this in 2024)   Mobic  [Meloxicam ] Hypertension   Crestor [Rosuvastatin] Other (See Comments)    Muscle aches   Norvasc [Amlodipine Besylate] Swelling and Other (See Comments)    Ankle swelling   Simvastatin Other (See Comments)    Muscle aches, high LFTs   Sulfa Antibiotics Rash   Zetia [Ezetimibe] Other (See Comments)     Muscle aches    Outpatient Encounter Medications as of 08/06/2023  Medication Sig   acetaminophen  (TYLENOL ) 325 MG tablet Take 650 mg by mouth every 4 (four) hours as needed for moderate pain (pain score 4-6).   allopurinol  (ZYLOPRIM ) 300 MG tablet Take 300 mg by mouth daily.   atorvastatin  (LIPITOR) 10 MG tablet Take 10 mg by mouth daily.   cephALEXin  (KEFLEX ) 250 MG capsule Take 250 mg by mouth at bedtime.   cloNIDine  (CATAPRES ) 0.1 MG tablet Take 0.1 mg by mouth daily as needed (for hypertension- if systolic b/p is over 160).   cyanocobalamin  1000 MCG tablet Take 1,000 mcg by mouth daily.   diclofenac Sodium (VOLTAREN) 1 % GEL Apply 2-4 g topically 4 (four) times daily.   docusate sodium  (COLACE) 100 MG capsule Take 100 mg by mouth 2 (two) times daily.   famotidine  (PEPCID ) 20 MG tablet Take 1 tablet (20 mg total) by mouth 2 (two) times daily.   ferrous sulfate  220 (44 Fe) MG/5ML solution Take 7.5 mLs by mouth 3 (three) times a week. Monday, Wednesday, Friday   hydrALAZINE  (APRESOLINE ) 100 MG tablet Take 1 tablet (100 mg total) by mouth 3 (three) times daily.   hydrocortisone (ANUSOL-HC) 2.5 % rectal cream Place 1 Application rectally 2 (two) times daily.   meclizine  (ANTIVERT ) 12.5 MG tablet Take 1 tablet (12.5 mg total) by mouth 3 (three) times daily as needed for dizziness. (Patient not taking: Reported on 07/16/2023)   pantoprazole  (PROTONIX ) 40 MG tablet Take 1 tablet (40 mg total) by mouth 2 (two) times daily.   polyethylene glycol powder (MIRALAX ) 17 GM/SCOOP powder Take 17 g by mouth daily. mix in 8 oz water .   potassium chloride  (KLOR-CON  M) 10 MEQ tablet Take 1 tablet (10 mEq total) by mouth daily.   Rivaroxaban  (XARELTO ) 15 MG TABS tablet Take 1 tablet (15 mg total) by mouth daily with supper.   senna-docusate (SENOKOT-S) 8.6-50 MG tablet Take 1 tablet by mouth 2 (two) times daily.   sucralfate  (CARAFATE ) 1 GM/10ML suspension Take 10 mLs (1 g total) by mouth 4 (four) times daily -   with meals and at bedtime. Give  1 hour before meals and at bedtime on an empt stomach   tamsulosin  (FLOMAX ) 0.4 MG CAPS capsule  Take 1 capsule (0.4 mg total) by mouth daily.   Zinc Oxide 10 % OINT Apply 1 Application topically See admin instructions. 1 application to buttocks every shift for redness   No facility-administered encounter medications on file as of 08/06/2023.    Review of Systems  Constitutional:  Negative for chills and fever.  Respiratory:  Positive for cough. Negative for shortness of breath and wheezing.   Cardiovascular:  Negative for chest pain, palpitations and leg swelling.  Gastrointestinal:  Negative for abdominal distention, abdominal pain, blood in stool, constipation, diarrhea, nausea and vomiting.  Genitourinary:  Negative for dysuria.  Neurological:  Negative for dizziness.  Psychiatric/Behavioral:  Negative for confusion.    Negative unless indicated in HPI.  There were no vitals filed for this visit. There is no height or weight on file to calculate BMI. BP Readings from Last 3 Encounters:  07/23/23 (!) 166/79  07/16/23 (!) 150/82  06/29/23 (!) 146/65   Wt Readings from Last 3 Encounters:  07/23/23 121 lb 6.4 oz (55.1 kg)  07/16/23 121 lb 6.4 oz (55.1 kg)  06/29/23 117 lb 6.4 oz (53.3 kg)   Physical Exam Constitutional:      Appearance: Normal appearance.  HENT:     Head: Normocephalic and atraumatic.  Cardiovascular:     Rate and Rhythm: Normal rate and regular rhythm.  Pulmonary:     Effort: Pulmonary effort is normal. No respiratory distress.     Breath sounds: Normal breath sounds. No wheezing.  Abdominal:     General: Bowel sounds are normal. There is no distension.     Tenderness: There is no abdominal tenderness. There is no guarding.     Comments:    Musculoskeletal:        General: No swelling.  Neurological:     Mental Status: She is alert. Mental status is at baseline.     Motor: No weakness.    Labs reviewed: Basic  Metabolic Panel: Recent Labs    01/06/23 0926 01/07/23 0425 01/08/23 0413 01/16/23 0000 05/24/23 0701 05/25/23 0603 05/26/23 0540 06/02/23 0000 06/18/23 0000  NA 134* 134* 135   < > 133* 132* 133* 137 131*  K 3.6 3.6 3.1*   < > 3.7 3.5 3.3* 3.0* 3.4*  CL 106 106 106   < > 103 104 106 107 105  CO2 22 19* 22   < > 21* 22 20* 24* 23*  GLUCOSE 115* 89 95   < > 96 104* 93  --   --   BUN 14 13 13    < > 12 10 10 14 12   CREATININE 0.62 0.65 0.61   < > 0.80 0.79 0.67 0.6 0.9  CALCIUM  9.9 9.2 9.1   < > 9.7 9.2 9.3 9.8 9.9  MG 1.9 2.2 2.4   < > 1.4* 2.1 1.7  --   --   PHOS 2.0* 2.6 2.2*  --   --   --   --   --   --    < > = values in this interval not displayed.   Liver Function Tests: Recent Labs    12/28/22 1256 01/01/23 0416 01/08/23 0413 01/16/23 0000 03/03/23 0000 05/22/23 1116 06/18/23 0000  AST 17  --  17   < > 12* 16 9*  ALT 14  --  11   < > 5* 7 5*  ALKPHOS 85  --  64   < > 76 77 89  BILITOT 0.8  --  0.6  --   --  0.8  --   PROT 6.5  --  5.6*  --   --  6.1*  --   ALBUMIN  3.0*   < > 2.7*   < > 3.2* 2.8* 3.3*   < > = values in this interval not displayed.   Recent Labs    10/12/22 2013 05/22/23 1450  LIPASE 26 28   Recent Labs    10/12/22 2104 12/28/22 1256 05/22/23 1450  AMMONIA <10 <10 13   CBC: Recent Labs    05/22/23 1116 05/23/23 0622 05/24/23 0701 05/25/23 0603 05/26/23 0540 06/02/23 0000 06/18/23 0000  WBC 8.8   < > 7.6 6.7 6.8 4.8 4.9  NEUTROABS 6.3  --   --   --   --  2,568.00 2,891.00  HGB 10.7*   < > 10.7* 9.6* 9.2* 9.6* 10.8*  HCT 34.2*   < > 33.2* 29.4* 28.8* 30* 33*  MCV 86.8   < > 84.3 83.1 84.7  --   --   PLT 211   < > 206 183 181 211 198   < > = values in this interval not displayed.   Cardiac Enzymes: No results for input(s): CKTOTAL, CKMB, CKMBINDEX, TROPONINI in the last 8760 hours. BNP: Invalid input(s): POCBNP Lab Results  Component Value Date   HGBA1C 6.0 (H) 10/13/2022   Lab Results  Component Value Date    TSH 1.438 05/22/2023   Lab Results  Component Value Date   VITAMINB12 1,459 (H) 05/22/2023   Lab Results  Component Value Date   FOLATE 11.7 01/03/2023   Lab Results  Component Value Date   IRON 16 01/19/2023   TIBC 155 01/19/2023   FERRITIN 88 01/19/2023    Imaging and Procedures obtained prior to SNF admission: MR BRAIN WO CONTRAST Result Date: 05/22/2023 CLINICAL DATA:  Mental status change EXAM: MRI HEAD WITHOUT CONTRAST TECHNIQUE: Multiplanar, multiecho pulse sequences of the brain and surrounding structures were obtained without intravenous contrast. COMPARISON:  Same-day head CT. FINDINGS: Brain: No acute infarct. No evidence of intracranial hemorrhage. Scattered and confluent FLAIR hyperintensity in the periventricular and subcortical white matter suggestive of moderate chronic microvascular ischemic changes. Additional FLAIR signal abnormality in the pons likely related to the same. Remote lacunar infarcts in the bilateral basal ganglia and right corona radiata. No mass lesion or midline shift. Cerebellum is unremarkable. Normal appearance of midline structures. The basilar cisterns are patent. No extra-axial fluid collections. Ventricles: Prominence of the lateral ventricles suggestive of underlying parenchymal volume loss. Vascular: Skull base flow voids are visualized. Skull and upper cervical spine: No focal abnormality. Sinuses/Orbits: Orbits are symmetric. Paranasal sinuses are clear. Other: Mastoid air cells are clear. IMPRESSION: No acute intracranial abnormality. Moderate chronic microvascular ischemic changes. Remote lacunar infarcts in the right corona radiata and bilateral basal ganglia. Mild generalized parenchymal volume loss. Electronically Signed   By: Donnice Mania M.D.   On: 05/22/2023 19:20   DG Chest Port 1 View Result Date: 05/22/2023 CLINICAL DATA:  83 year old female with questionable sepsis EXAM: PORTABLE CHEST 1 VIEW COMPARISON:  12/22/2022 FINDINGS:  Cardiomediastinal silhouette unchanged in size and contour. No evidence of central vascular congestion. No interlobular septal thickening. Low lung volumes. Opacity at the left lung base obscuring the left hemidiaphragm and the left heart border. No pneumothorax No acute displaced fracture. Surgical changes of the shoulders incompletely imaged IMPRESSION: Low lung volumes with opacity at the left lung base, potentially pneumonia/consolidation or atelectasis. Electronically Signed   By: Jaime  Wagner D.O.  On: 05/22/2023 13:55   CT Head Wo Contrast Result Date: 05/22/2023 CLINICAL DATA:  Mental status change, altered mental status. EXAM: CT HEAD WITHOUT CONTRAST TECHNIQUE: Contiguous axial images were obtained from the base of the skull through the vertex without intravenous contrast. RADIATION DOSE REDUCTION: This exam was performed according to the departmental dose-optimization program which includes automated exposure control, adjustment of the mA and/or kV according to patient size and/or use of iterative reconstruction technique. COMPARISON:  MRI head 12/28/2022, CT head 10/12/2022 FINDINGS: Brain: No acute intracranial hemorrhage. No CT evidence of acute infarct. Nonspecific hypoattenuation in the periventricular and subcortical white matter favored to reflect chronic microvascular ischemic changes. Remote lacunar infarct in the right corona radiata. No edema, mass effect, or midline shift. The basilar cisterns are patent. Ventricles: The ventricles are normal. Vascular: Atherosclerotic calcifications of the carotid siphons and intracranial vertebral arteries. No hyperdense vessel. Skull: No acute or aggressive finding. Chronic appearing bilateral nasal bone deformities. Orbits: Orbits are symmetric. Sinuses: Mild mucosal thickening in the ethmoid sinuses. Other: Mastoid air cells are clear. IMPRESSION: No CT evidence of acute intracranial abnormality. Moderate chronic microvascular ischemic changes. Small  remote infarct in the right corona radiata. Electronically Signed   By: Donnice Mania M.D.   On: 05/22/2023 12:48    Assessment and Plan Assessment & Plan  Cough Patient complains of productive cough since few days She is able to speak in full sentences, does not appear to be distressed Patient is afebrile neck, vital stable Will get's chest x-ray Will start Tessalon Will monitor  Major neurocognitive disorder Continue with the supportive care Continue cognitively engaging activities and physical activity  History of A-fib Rate controlled Continue with rivaroxaban   History of CVA Continue with Lipitor  GERD Continue with famotidine , Protonix  No signs of bleeding   30 min Total time spent for obtaining history,  performing a medically appropriate examination and evaluation, reviewing the tests,ordering  tests,  documenting clinical information in the electronic or other health record,  ,care coordination (not separately reported)

## 2023-08-07 ENCOUNTER — Non-Acute Institutional Stay: Payer: Self-pay | Admitting: Sports Medicine

## 2023-08-07 DIAGNOSIS — R051 Acute cough: Secondary | ICD-10-CM

## 2023-08-07 DIAGNOSIS — S31000A Unspecified open wound of lower back and pelvis without penetration into retroperitoneum, initial encounter: Secondary | ICD-10-CM | POA: Diagnosis not present

## 2023-08-07 LAB — CBC AND DIFFERENTIAL
HCT: 34 — AB (ref 36–46)
Hemoglobin: 10.7 — AB (ref 12.0–16.0)
Neutrophils Absolute: 3757
Platelets: 197 10*3/uL (ref 150–400)
WBC: 5.5

## 2023-08-07 LAB — CBC: RBC: 3.86 — AB (ref 3.87–5.11)

## 2023-08-07 NOTE — Progress Notes (Signed)
 Provider:  Dr. Tye Gall Location:  [facility] Place of Service:   Assisted living   PCP: [name], MD Patient Care Team: [name], MD as PCP - General (Family Medicine) Tina Bertrand, DO as PCP - Cardiology (Cardiology) Dorthey Gave, PA-C as Physician Assistant (Dermatology)  Extended Emergency Contact Information Primary Emergency Contact: Jauna, [place], [place] [address] [place] Home Phone: [phone] Work Phone: [phone] Mobile Phone: [phone] Relation: Son Secondary Emergency Contact: Sparks,[name]  [place] Mobile Phone: [phone] Relation: Son  Goals of Care: Advanced Directive information    07/23/2023   11:25 AM  Advanced Directives  Does Patient Have a Medical Advance Directive? Yes  Type of Estate agent of [place];Living will  Does patient want to make changes to medical advance directive? No - Patient declined  Copy of Healthcare Power of Attorney in Chart? Yes - validated most recent copy scanned in chart (See row information)      No chief complaint on file.     History of Present Illness  83 year old female with a history dementia, hyponatremia, hypokalemia, A-fib, history of CVA CVAs evaluated for acute visit for open wound sacrum area. Patient seen and examined in her room.  Her son at bedside. Patient states her cough is getting better.  She is getting Tessalon.  She had a chest x-ray done yesterday which showed like questionable infiltrates or pneumonia.  She had leg CBC drawn and the results are still pending. Patient is afebrile denies shortness of breath she is able to ambulate with help with a walker.  Denies chest pain, abdominal pain, nausea, vomiting, dizzy, lightheadedness. Staff reported small open wound on her bottom.  Patient denies sacral pain. No drainage noted.     Past Medical History:  Diagnosis Date   CKD (chronic  kidney disease), stage II    nephrologist-- dr Ansel Kingdom  [facility])   Diabetes mellitus type 2, diet-controlled (HCC)    followed by pcp---   (09-20-2019  per pt does not check blood sugar at home)   Fracture of humeral shaft, right, closed 04/25/2022   GERD (gastroesophageal reflux disease)    History of primary hyperparathyroidism    s/p  parathyroidectomy right inferior on 12-02-2012   Humerus fracture 04/25/2022   Hyperlipidemia    Hypertension    followd by nephrologist----  (09-20-2019  per pt had stress test done some time ago , unsure when/ where, thinks told ok)   OA (osteoarthritis)    Osteoporosis    Prosthetic joint infection (HCC)    followed by dr j. hatcher (ID)   left total shoulder arthroplasty 12/ 2016  ; 12/ 2020  revision with explant joint replacement and hemiarthroplasty;  completed 6 month antibiotic 05/ 2021   Vertigo    Past Surgical History:  Procedure Laterality Date   COLONOSCOPY     DILATATION & CURETTAGE/HYSTEROSCOPY WITH MYOSURE N/A 09/28/2019   Procedure: DILATATION & CURETTAGE/HYSTEROSCOPY WITH MYOSURE;  Surgeon: Meriam Stamp, MD;  Location: [facility];  Service: Gynecology;  Laterality: N/A;   EYE SURGERY  yrs ago   laser surgery for retinal tear.  (unilateral , pt unsure which side)   ORIF HUMERUS FRACTURE Right 04/27/2022   Procedure: OPEN REDUCTION INTERNAL FIXATION (ORIF) PROXIMAL HUMERUS FRACTURE;  Surgeon: Micheline Ahr, MD;  Location: [facility];  Service: Orthopedics;  Laterality: Right;   PARATHYROIDECTOMY N/A 12/02/2012   Procedure: PARATHYROIDECTOMY  with frozen section ;  Surgeon: Keitha Pata, MD;  Location: [facility];  Service: General;  Laterality: N/A;   REVERSE SHOULDER ARTHROPLASTY Right 04/27/2022   Procedure: REVERSE SHOULDER ARTHROPLASTY;  Surgeon: Micheline Ahr, MD;  Location: [facility];  Service: Orthopedics;  Laterality: Right;   SHOULDER INJECTION Left 07/27/2013   Procedure: SHOULDER INJECTION;  Surgeon: Ferd Householder, MD;  Location[facility];  Service: Orthopedics;  Laterality: Left;   STERIOD INJECTION Right 02/14/2015   Procedure: RIGHT SHOULDER CORTISONE INJECTION;  Surgeon: Ferd Householder, MD;  Location: [facility] OR;  Service: Orthopedics;  Laterality: Right;   TOTAL HIP ARTHROPLASTY Left 07/27/2013   Procedure: TOTAL HIP ARTHROPLASTY ANTERIOR APPROACH;  Surgeon: Ferd Householder, MD;  Location: [facility] OR;  Service: Orthopedics;  Laterality: Left;   TOTAL SHOULDER ARTHROPLASTY Left 02/14/2015   Procedure: LEFT TOTAL SHOULDER ARTHROPLASTY;  Surgeon: Ferd Householder, MD;  Location: [facility] OR;  Service: Orthopedics;  Laterality: Left;   TOTAL SHOULDER REVISION Left 02/09/2019   Procedure: TOTAL SHOULDER REVISION;  Surgeon: Micheline Ahr, MD;  Location: [facility];  Service: Orthopedics;  Laterality: Left;    reports that she quit smoking about 35 years ago. Her smoking use included cigarettes. She started smoking about 55 years ago. She has a 20 pack-year smoking history. She has never used smokeless tobacco. She reports that she does not drink alcohol and does not use drugs. Social History   Socioeconomic History   Marital status: Widowed    Spouse name: Not on file   Number of children: Not on file   Years of education: Not on file   Highest education level: Not on file  Occupational History   Not on file  Tobacco Use   Smoking status: Former    Current packs/day: 0.00    Average packs/day: 1 pack/day for 20.0 years (20.0 ttl pk-yrs)    Types: Cigarettes    Start date: 10/28/1967    Quit date: 10/28/1987    Years since quitting: 35.8   Smokeless tobacco: Never  Vaping Use   Vaping status: Never Used  Substance and Sexual Activity   Alcohol use: No   Drug use: Never   Sexual activity: Yes    Birth control/protection: Post-menopausal  Other Topics Concern   Not on file  Social History Narrative   Not on file   Social Drivers of Health   Financial Resource Strain: Not on file  Food Insecurity: No Food  Insecurity (05/22/2023)   Hunger Vital Sign    Worried About Running Out of Food in the Last Year: Never true    Ran Out of Food in the Last Year: Never true  Transportation Needs: No Transportation Needs (05/22/2023)   PRAPARE - Administrator, Civil Service (Medical): No    Lack of Transportation (Non-Medical): No  Physical Activity: Not on file  Stress: Not on file  Social Connections: Unknown (05/22/2023)   Social Connection and Isolation Panel [NHANES]    Frequency of Communication with Friends and Family: Patient unable to answer    Frequency of Social Gatherings with Friends and Family: Patient unable to answer    Attends Religious Services: Patient unable to answer    Active Member of Clubs or Organizations: Patient unable to answer    Attends Banker Meetings: Patient unable to answer    Marital Status: Widowed  Intimate Partner Violence: Not At Risk (05/22/2023)   Humiliation, Afraid, Rape, and Kick questionnaire  Fear of Current or Ex-Partner: No    Emotionally Abused: No    Physically Abused: No    Sexually Abused: No    Functional Status Survey:    Family History  Problem Relation Age of Onset   Heart failure Mother    Cancer Mother    Breast cancer Mother        in her 60s   Cancer Father     Health Maintenance  Topic Date Due   OPHTHALMOLOGY EXAM  Never done   HEMOGLOBIN A1C  04/15/2023   FOOT EXAM  02/20/2024   Medicare Annual Wellness (AWV)  02/20/2024   Diabetic kidney evaluation - Urine ACR  02/26/2024   Diabetic kidney evaluation - eGFR measurement  06/17/2024   DTaP/Tdap/Td (4 - Td or Tdap) 06/09/2027   Pneumonia Vaccine 39+ Years old  Completed   DEXA SCAN  Completed   HPV VACCINES  Aged Out   Meningococcal B Vaccine  Aged Out   INFLUENZA VACCINE  Discontinued   COVID-19 Vaccine  Discontinued   Zoster Vaccines- Shingrix  Discontinued    Allergies  Allergen Reactions   Abaloparatide Other (See Comments)    "Burred  vision, lightheaded" (patient does not recall this in 2024)   Mobic  [Meloxicam ] Hypertension   Crestor [Rosuvastatin] Other (See Comments)    Muscle aches   Norvasc [Amlodipine Besylate] Swelling and Other (See Comments)    Ankle swelling   Simvastatin Other (See Comments)    Muscle aches, high LFTs   Sulfa Antibiotics Rash   Zetia [Ezetimibe] Other (See Comments)    Muscle aches    Outpatient Encounter Medications as of 08/07/2023  Medication Sig   acetaminophen  (TYLENOL ) 325 MG tablet Take 650 mg by mouth every 4 (four) hours as needed for moderate pain (pain score 4-6).   allopurinol  (ZYLOPRIM ) 300 MG tablet Take 300 mg by mouth daily.   atorvastatin  (LIPITOR) 10 MG tablet Take 10 mg by mouth daily.   cephALEXin  (KEFLEX ) 250 MG capsule Take 250 mg by mouth at bedtime.   cloNIDine  (CATAPRES ) 0.1 MG tablet Take 0.1 mg by mouth daily as needed (for hypertension- if systolic b/p is over 160).   cyanocobalamin  1000 MCG tablet Take 1,000 mcg by mouth daily.   diclofenac Sodium (VOLTAREN) 1 % GEL Apply 2-4 g topically 4 (four) times daily.   docusate sodium  (COLACE) 100 MG capsule Take 100 mg by mouth 2 (two) times daily.   famotidine  (PEPCID ) 20 MG tablet Take 1 tablet (20 mg total) by mouth 2 (two) times daily.   ferrous sulfate  220 (44 Fe) MG/5ML solution Take 7.5 mLs by mouth 3 (three) times a week. Monday, Wednesday, Friday   hydrALAZINE  (APRESOLINE ) 100 MG tablet Take 1 tablet (100 mg total) by mouth 3 (three) times daily.   hydrocortisone (ANUSOL-HC) 2.5 % rectal cream Place 1 Application rectally 2 (two) times daily.   meclizine  (ANTIVERT ) 12.5 MG tablet Take 1 tablet (12.5 mg total) by mouth 3 (three) times daily as needed for dizziness. (Patient not taking: Reported on 07/16/2023)   pantoprazole  (PROTONIX ) 40 MG tablet Take 1 tablet (40 mg total) by mouth 2 (two) times daily.   polyethylene glycol powder (MIRALAX ) 17 GM/SCOOP powder Take 17 g by mouth daily. mix in 8 oz water .    potassium chloride  (KLOR-CON  M) 10 MEQ tablet Take 1 tablet (10 mEq total) by mouth daily.   Rivaroxaban  (XARELTO ) 15 MG TABS tablet Take 1 tablet (15 mg total) by mouth daily with  supper.   senna-docusate (SENOKOT-S) 8.6-50 MG tablet Take 1 tablet by mouth 2 (two) times daily.   sucralfate  (CARAFATE ) 1 GM/10ML suspension Take 10 mLs (1 g total) by mouth 4 (four) times daily -  with meals and at bedtime. Give  1 hour before meals and at bedtime on an empt stomach   tamsulosin  (FLOMAX ) 0.4 MG CAPS capsule Take 1 capsule (0.4 mg total) by mouth daily.   Zinc Oxide 10 % OINT Apply 1 Application topically See admin instructions. 1 application to buttocks every shift for redness   No facility-administered encounter medications on file as of 08/07/2023.    Review of Systems  Constitutional:  Negative for fever.  HENT:  Negative for sinus pressure and sore throat.   Respiratory:  Positive for cough. Negative for shortness of breath and wheezing.   Cardiovascular:  Negative for chest pain, palpitations and leg swelling.  Gastrointestinal:  Negative for abdominal distention, abdominal pain, blood in stool, constipation, diarrhea, nausea and vomiting.  Genitourinary:  Negative for dysuria.  Neurological:  Negative for dizziness.   Negative unless indicated in HPI.  There were no vitals filed for this visit. There is no height or weight on file to calculate BMI. BP Readings from Last 3 Encounters:  07/23/23 (!) 166/79  07/16/23 (!) 150/82  06/29/23 (!) 146/65   Wt Readings from Last 3 Encounters:  07/23/23 121 lb 6.4 oz (55.1 kg)  07/16/23 121 lb 6.4 oz (55.1 kg)  06/29/23 117 lb 6.4 oz (53.3 kg)   Physical Exam Constitutional:      Appearance: Normal appearance.  HENT:     Head: Normocephalic and atraumatic.  Cardiovascular:     Rate and Rhythm: Normal rate and regular rhythm.  Pulmonary:     Effort: Pulmonary effort is normal. No respiratory distress.     Breath sounds: Normal breath  sounds. No wheezing.  Abdominal:     General: Bowel sounds are normal. There is no distension.     Tenderness: There is no abdominal tenderness. There is no guarding or rebound.     Comments:    Musculoskeletal:        General: No swelling.     Comments: Small open wound on her sacrum 0.5cm , shallow, no surrounding erythema or drainage   Neurological:     Mental Status: She is alert. Mental status is at baseline.     Motor: No weakness.     Labs reviewed: Basic Metabolic Panel: Recent Labs    01/06/23 0926 01/07/23 0425 01/08/23 0413 01/16/23 0000 05/24/23 0701 05/25/23 0603 05/26/23 0540 06/02/23 0000 06/18/23 0000  NA 134* 134* 135   < > 133* 132* 133* 137 131*  K 3.6 3.6 3.1*   < > 3.7 3.5 3.3* 3.0* 3.4*  CL 106 106 106   < > 103 104 106 107 105  CO2 22 19* 22   < > 21* 22 20* 24* 23*  GLUCOSE 115* 89 95   < > 96 104* 93  --   --   BUN 14 13 13    < > 12 10 10 14 12   CREATININE 0.62 0.65 0.61   < > 0.80 0.79 0.67 0.6 0.9  CALCIUM  9.9 9.2 9.1   < > 9.7 9.2 9.3 9.8 9.9  MG 1.9 2.2 2.4   < > 1.4* 2.1 1.7  --   --   PHOS 2.0* 2.6 2.2*  --   --   --   --   --   --    < > =  values in this interval not displayed.   Liver Function Tests: Recent Labs    12/28/22 1256 01/01/23 0416 01/08/23 0413 01/16/23 0000 03/03/23 0000 05/22/23 1116 06/18/23 0000  AST 17  --  17   < > 12* 16 9*  ALT 14  --  11   < > 5* 7 5*  ALKPHOS 85  --  64   < > 76 77 89  BILITOT 0.8  --  0.6  --   --  0.8  --   PROT 6.5  --  5.6*  --   --  6.1*  --   ALBUMIN  3.0*   < > 2.7*   < > 3.2* 2.8* 3.3*   < > = values in this interval not displayed.   Recent Labs    10/12/22 2013 05/22/23 1450  LIPASE 26 28   Recent Labs    10/12/22 2104 12/28/22 1256 05/22/23 1450  AMMONIA <10 <10 13   CBC: Recent Labs    05/22/23 1116 05/23/23 0622 05/24/23 0701 05/25/23 0603 05/26/23 0540 06/02/23 0000 06/18/23 0000  WBC 8.8   < > 7.6 6.7 6.8 4.8 4.9  NEUTROABS 6.3  --   --   --   --   2,568.00 2,891.00  HGB 10.7*   < > 10.7* 9.6* 9.2* 9.6* 10.8*  HCT 34.2*   < > 33.2* 29.4* 28.8* 30* 33*  MCV 86.8   < > 84.3 83.1 84.7  --   --   PLT 211   < > 206 183 181 211 198   < > = values in this interval not displayed.   Cardiac Enzymes: No results for input(s): "CKTOTAL", "CKMB", "CKMBINDEX", "TROPONINI" in the last 8760 hours. BNP: Invalid input(s): "POCBNP" Lab Results  Component Value Date   HGBA1C 6.0 (H) 10/13/2022   Lab Results  Component Value Date   TSH 1.438 05/22/2023   Lab Results  Component Value Date   VITAMINB12 1,459 (H) 05/22/2023   Lab Results  Component Value Date   FOLATE 11.7 01/03/2023   Lab Results  Component Value Date   IRON 16 01/19/2023   TIBC 155 01/19/2023   FERRITIN 88 01/19/2023    Imaging and Procedures obtained prior to SNF admission: MR BRAIN WO CONTRAST Result Date: 05/22/2023 CLINICAL DATA:  Mental status change EXAM: MRI HEAD WITHOUT CONTRAST TECHNIQUE: Multiplanar, multiecho pulse sequences of the brain and surrounding structures were obtained without intravenous contrast. COMPARISON:  Same-day head CT. FINDINGS: Brain: No acute infarct. No evidence of intracranial hemorrhage. Scattered and confluent FLAIR hyperintensity in the periventricular and subcortical white matter suggestive of moderate chronic microvascular ischemic changes. Additional FLAIR signal abnormality in the pons likely related to the same. Remote lacunar infarcts in the bilateral basal ganglia and right corona radiata. No mass lesion or midline shift. Cerebellum is unremarkable. Normal appearance of midline structures. The basilar cisterns are patent. No extra-axial fluid collections. Ventricles: Prominence of the lateral ventricles suggestive of underlying parenchymal volume loss. Vascular: Skull base flow voids are visualized. Skull and upper cervical spine: No focal abnormality. Sinuses/Orbits: Orbits are symmetric. Paranasal sinuses are clear. Other: Mastoid air  cells are clear. IMPRESSION: No acute intracranial abnormality. Moderate chronic microvascular ischemic changes. Remote lacunar infarcts in the right corona radiata and bilateral basal ganglia. Mild generalized parenchymal volume loss. Electronically Signed   By: Denny Flack M.D.   On: 05/22/2023 19:20   DG Chest Port 1 View Result Date: 05/22/2023 CLINICAL DATA:  83 year old female  with questionable sepsis EXAM: PORTABLE CHEST 1 VIEW COMPARISON:  12/22/2022 FINDINGS: Cardiomediastinal silhouette unchanged in size and contour. No evidence of central vascular congestion. No interlobular septal thickening. Low lung volumes. Opacity at the left lung base obscuring the left hemidiaphragm and the left heart border. No pneumothorax No acute displaced fracture. Surgical changes of the shoulders incompletely imaged IMPRESSION: Low lung volumes with opacity at the left lung base, potentially pneumonia/consolidation or atelectasis. Electronically Signed   By: Myrlene Asper D.O.   On: 05/22/2023 13:55   CT Head Wo Contrast Result Date: 05/22/2023 CLINICAL DATA:  Mental status change, altered mental status. EXAM: CT HEAD WITHOUT CONTRAST TECHNIQUE: Contiguous axial images were obtained from the base of the skull through the vertex without intravenous contrast. RADIATION DOSE REDUCTION: This exam was performed according to the departmental dose-optimization program which includes automated exposure control, adjustment of the mA and/or kV according to patient size and/or use of iterative reconstruction technique. COMPARISON:  MRI head 12/28/2022, CT head 10/12/2022 FINDINGS: Brain: No acute intracranial hemorrhage. No CT evidence of acute infarct. Nonspecific hypoattenuation in the periventricular and subcortical white matter favored to reflect chronic microvascular ischemic changes. Remote lacunar infarct in the right corona radiata. No edema, mass effect, or midline shift. The basilar cisterns are patent. Ventricles:  The ventricles are normal. Vascular: Atherosclerotic calcifications of the carotid siphons and intracranial vertebral arteries. No hyperdense vessel. Skull: No acute or aggressive finding. Chronic appearing bilateral nasal bone deformities. Orbits: Orbits are symmetric. Sinuses: Mild mucosal thickening in the ethmoid sinuses. Other: Mastoid air cells are clear. IMPRESSION: No CT evidence of acute intracranial abnormality. Moderate chronic microvascular ischemic changes. Small remote infarct in the right corona radiata. Electronically Signed   By: Denny Flack M.D.   On: 05/22/2023 12:48    Assessment and Plan Assessment & Plan  1. Wound of sacral region, initial encounter (Primary) Small open wound on her bottom No signs of infection Instructed nursing staff to put the foam dressing Apply zinc oxide Frequent repositioning   2. Acute cough Lungs clear Patient states her cough is getting better Continue with Tessalon Reviewed chest x-ray CBC pending Will monitor.   30 min Total time spent for obtaining history,  performing a medically appropriate examination and evaluation, reviewing the tests,  documenting clinical information in the electronic or other health record,  ,care coordination (not separately reported)

## 2023-08-10 ENCOUNTER — Encounter: Payer: Self-pay | Admitting: Sports Medicine

## 2023-08-24 ENCOUNTER — Non-Acute Institutional Stay: Payer: Self-pay | Admitting: Sports Medicine

## 2023-08-24 ENCOUNTER — Encounter: Payer: Self-pay | Admitting: Sports Medicine

## 2023-08-24 DIAGNOSIS — R300 Dysuria: Secondary | ICD-10-CM | POA: Diagnosis not present

## 2023-08-24 DIAGNOSIS — R42 Dizziness and giddiness: Secondary | ICD-10-CM

## 2023-08-24 DIAGNOSIS — N390 Urinary tract infection, site not specified: Secondary | ICD-10-CM | POA: Diagnosis not present

## 2023-08-24 DIAGNOSIS — R059 Cough, unspecified: Secondary | ICD-10-CM | POA: Diagnosis not present

## 2023-08-24 NOTE — Progress Notes (Unsigned)
 Location:   [facility]  N[facility]ing Home Room Number: 801-A Place of Service:  ALF [phone]) Provider:  Fredy Jericho   PCP: [name], MD  Patient Care Team: [name], MD as PCP - General (Family Medicine) Olinda Bertrand, DO as PCP - Cardiology (Cardiology) Reggy Capers as Physician Assistant (Dermatology)  Extended Emergency Contact Information Primary Emergency Contact: Christyana, [place], [place] [address] [place] Home Phone: [phone] Work Phone: [phone] Mobile Phone: [phone] Relation: Son Secondary Emergency Contact: Dwyer,[name]  [place] Phone: [phone] Relation: Son  Code Status:  DNR Goals of care: Advanced Directive information    08/24/2023    2:27 PM  Advanced Directives  Does Patient Have a Medical Advance Directive? Yes  Type of Estate agent of [place];Living will;Out of facility DNR (pink MOST or yellow form)  Does patient want to make changes to medical advance directive? No - Patient declined  Copy of Healthcare Power of Attorney in Chart? Yes - validated most recent copy scanned in chart (See row information)     No chief complaint on file.   HPI:  Pt is a 83 y.o. female with past medical history of dementia, hyponatremia, hypokalemia, A-fib, history of CVA is seen today for an acute visit for nursing report for dysuria. Patient seen and examined in her room.  Patient states she is feeling lightheaded and states I do not feel good. Is headache, nausea, vomiting chest pain, shortness of breath. She had couple of weeks ago but states it is improving. She denies fevers, chills abdominal pain, dysuria, hematuria, bloody or dark-colored stools. Patient complains of chronic bilateral shoulder pain.  No recent falls.  She has a history of recurrent UTIs  She is currently on cephalexin  as per urology.    Past Medical History:  Diagnosis  Date   CKD (chronic kidney disease), stage II    nephrologist-- dr Ansel Kingdom  [facility])   Diabetes mellitus type 2, diet-controlled (HCC)    followed by pcp---   (09-20-2019  per pt does not check blood sugar at home)   Fracture of humeral shaft, right, closed 04/25/2022   GERD (gastroesophageal reflux disease)    History of primary hyperparathyroidism    s/p  parathyroidectomy right inferior on 12-02-2012   Humerus fracture 04/25/2022   Hyperlipidemia    Hypertension    followd by nephrologist----  (09-20-2019  per pt had stress test done some time ago , unsure when/ where, thinks told ok)   OA (osteoarthritis)    Osteoporosis    Prosthetic joint infection (HCC)    followed by dr j. hatcher (ID)   left total shoulder arthroplasty 12/ 2016  ; 12/ 2020  revision with explant joint replacement and hemiarthroplasty;  completed 6 month antibiotic 05/ 2021   Vertigo    Past Surgical History:  Procedure Laterality Date   COLONOSCOPY     DILATATION & CURETTAGE/HYSTEROSCOPY WITH MYOSURE N/A 09/28/2019   Procedure: DILATATION & CURETTAGE/HYSTEROSCOPY WITH MYOSURE;  Surgeon: Meriam Stamp, MD;  Location: [facility];  Service: Gynecology;  Laterality: N/A;   EYE SURGERY  yrs ago   laser surgery for retinal tear.  (unilateral , pt unsure which side)   ORIF HUMERUS FRACTURE Right 04/27/2022   Procedure: OPEN REDUCTION INTERNAL FIXATION (ORIF) PROXIMAL HUMERUS FRACTURE;  Surgeon: Micheline Ahr, MD;  Location: [facility];  Service: Orthopedics;  Laterality: Right;  PARATHYROIDECTOMY N/A 12/02/2012   Procedure: PARATHYROIDECTOMY with frozen section ;  Surgeon: Keitha Pata, MD;  Location: [facility];  Service: General;  Laterality: N/A;   REVERSE SHOULDER ARTHROPLASTY Right 04/27/2022   Procedure: REVERSE SHOULDER ARTHROPLASTY;  Surgeon: Micheline Ahr, MD;  Location: [facility];  Service: Orthopedics;  Laterality: Right;   SHOULDER INJECTION Left 07/27/2013   Procedure: SHOULDER  INJECTION;  Surgeon: Ferd Householder, MD;  Location: [facility];  Service: Orthopedics;  Laterality: Left;   STERIOD INJECTION Right 02/14/2015   Procedure: RIGHT SHOULDER CORTISONE INJECTION;  Surgeon: Ferd Householder, MD;  Location: [facility] OR;  Service: Orthopedics;  Laterality: Right;   TOTAL HIP ARTHROPLASTY Left 07/27/2013   Procedure: TOTAL HIP ARTHROPLASTY ANTERIOR APPROACH;  Surgeon: Ferd Householder, MD;  Location: [facility] OR;  Service: Orthopedics;  Laterality: Left;   TOTAL SHOULDER ARTHROPLASTY Left 02/14/2015   Procedure: LEFT TOTAL SHOULDER ARTHROPLASTY;  Surgeon: Ferd Householder, MD;  Location: [facility] OR;  Service: Orthopedics;  Laterality: Left;   TOTAL SHOULDER REVISION Left 02/09/2019   Procedure: TOTAL SHOULDER REVISION;  Surgeon: Micheline Ahr, MD;  Location: [facility];  Service: Orthopedics;  Laterality: Left;    Allergies  Allergen Reactions   Abaloparatide Other (See Comments)    Burred vision, lightheaded (patient does not recall this in 2024)   Mobic  [Meloxicam ] Hypertension   Crestor [Rosuvastatin] Other (See Comments)    Muscle aches   Norvasc [Amlodipine Besylate] Swelling and Other (See Comments)    Ankle swelling   Simvastatin Other (See Comments)    Muscle aches, high LFTs   Sulfa Antibiotics Rash   Zetia [Ezetimibe] Other (See Comments)    Muscle aches    Allergies as of 08/24/2023       Reactions   Abaloparatide Other (See Comments)   Burred vision, lightheaded (patient does not recall this in 2024)   Mobic  [meloxicam ] Hypertension   Crestor [rosuvastatin] Other (See Comments)   Muscle aches   Norvasc [amlodipine Besylate] Swelling, Other (See Comments)   Ankle swelling   Simvastatin Other (See Comments)   Muscle aches, high LFTs   Sulfa Antibiotics Rash   Zetia [ezetimibe] Other (See Comments)   Muscle aches        Medication List        Accurate as of August 24, 2023  2:27 PM. If you have any questions, ask your nurse or doctor.          acetaminophen   325 MG tablet Commonly known as: TYLENOL  Take 650 mg by mouth 2 (two) times daily.   allopurinol  300 MG tablet Commonly known as: ZYLOPRIM  Take 300 mg by mouth daily.   atorvastatin  10 MG tablet Commonly known as: LIPITOR Take 10 mg by mouth daily.   cephALEXin  250 MG capsule Commonly known as: KEFLEX  Take 250 mg by mouth at bedtime.   cloNIDine  0.1 MG tablet Commonly known as: CATAPRES  Take 0.1 mg by mouth daily as needed (for hypertension- if systolic b/p is over 160).   cyanocobalamin  1000 MCG tablet Take 1,000 mcg by mouth daily.   diclofenac Sodium 1 % Gel Commonly known as: VOLTAREN Apply 2-4 g topically 4 (four) times daily.   docusate sodium  100 MG capsule Commonly known as: COLACE Take 100 mg by mouth 2 (two) times daily.   famotidine  20 MG tablet Commonly known as: PEPCID  Take 1 tablet (20 mg total) by mouth 2 (two) times daily.   ferrous sulfate  220 (44 Fe) MG/5ML solution Take  7.5 mLs by mouth 3 (three) times a week. Monday, Wednesday, Friday   hydrALAZINE  100 MG tablet Commonly known as: APRESOLINE  Take 1 tablet (100 mg total) by mouth 3 (three) times daily.   hydrocortisone 2.5 % rectal cream Commonly known as: ANUSOL-HC Place 1 Application rectally 2 (two) times daily.   meclizine  12.5 MG tablet Commonly known as: ANTIVERT  Take 1 tablet (12.5 mg total) by mouth 3 (three) times daily as needed for dizziness.   MiraLax  17 GM/SCOOP powder Generic drug: polyethylene glycol powder Take 17 g by mouth daily. mix in 8 oz water .   pantoprazole  40 MG tablet Commonly known as: PROTONIX  Take 1 tablet (40 mg total) by mouth 2 (two) times daily.   potassium chloride  10 MEQ tablet Commonly known as: KLOR-CON  M Take 1 tablet (10 mEq total) by mouth daily.   Rivaroxaban  15 MG Tabs tablet Commonly known as: XARELTO  Take 1 tablet (15 mg total) by mouth daily with supper.   senna-docusate 8.6-50 MG tablet Commonly known as: Senokot-S Take 1 tablet by  mouth 2 (two) times daily.   sucralfate  1 GM/10ML suspension Commonly known as: Carafate  Take 10 mLs (1 g total) by mouth 4 (four) times daily -  with meals and at bedtime. Give  1 hour before meals and at bedtime on an empt stomach   tamsulosin  0.4 MG Caps capsule Commonly known as: FLOMAX  Take 1 capsule (0.4 mg total) by mouth daily.   Zinc Oxide 10 % Oint Apply 1 Application topically See admin instructions. 1 application to buttocks every shift for redness        Review of Systems  Constitutional:  Negative for chills and fever.  Respiratory:  Negative for cough, shortness of breath and wheezing.   Cardiovascular:  Negative for chest pain, palpitations and leg swelling.  Gastrointestinal:  Negative for abdominal distention, abdominal pain, blood in stool, constipation, diarrhea, nausea and vomiting.  Genitourinary:  Negative for dysuria.  Musculoskeletal:  Positive for arthralgias.  Neurological:  Negative for dizziness.    Immunization History  Administered Date(s) Administered   Influenza Split 11/24/2008   Influenza, High Dose Seasonal PF 03/16/2014, 01/14/2016, 01/21/2017, 02/24/2022   Influenza,inj,Quad PF,6+ Mos 11/20/2010, 02/01/2013   Influenza-Unspecified 11/20/2010, 02/01/2013, 02/19/2018, 02/24/2022   Moderna Sars-Covid-2 Vaccination 04/23/2019, 05/21/2019, 02/20/2020   Pneumococcal Conjugate-13 03/16/2014   Pneumococcal Polysaccharide-23 04/28/2005   Td 04/28/2005   Td (Adult) 04/28/2005   Tdap 06/08/2017   Zoster Recombinant(Shingrix) 01/14/2019   Zoster, Live 07/21/2007   Pertinent  Health Maintenance Due  Topic Date Due   OPHTHALMOLOGY EXAM  Never done   HEMOGLOBIN A1C  04/15/2023   FOOT EXAM  02/20/2024   DEXA SCAN  Completed   INFLUENZA VACCINE  Discontinued      04/12/2019    2:21 PM 07/19/2019    2:20 PM 09/28/2019    8:51 AM 06/21/2020    5:59 PM 09/23/2021    1:10 PM  Fall Risk  Falls in the past year? 0  0      (RETIRED) Patient Fall Risk  Level Low fall risk  Low fall risk  Moderate fall risk  Low fall risk  Low fall risk   Fall risk Follow up Falls evaluation completed  Falls evaluation completed         Data saved with a previous flowsheet row definition   Functional Status Survey:    Vitals:   08/24/23 1419  BP: 123/61  Pulse: 66  Resp: 18  Temp: 97.7 F (  36.5 C)  SpO2: 96%  Weight: 120 lb 3.2 oz (54.5 kg)  Height: 5' 4 (1.626 m)   Body mass index is 20.63 kg/m. Physical Exam Constitutional:      Appearance: Normal appearance.  HENT:     Head: Normocephalic and atraumatic.   Cardiovascular:     Rate and Rhythm: Normal rate and regular rhythm.  Pulmonary:     Effort: Pulmonary effort is normal. No respiratory distress.     Breath sounds: Normal breath sounds. No wheezing.  Abdominal:     General: Bowel sounds are normal. There is no distension.     Tenderness: There is no abdominal tenderness. There is no guarding or rebound.     Comments:     Musculoskeletal:        General: No swelling.   Neurological:     Mental Status: She is alert. Mental status is at baseline.     Motor: No weakness.     Labs reviewed: Recent Labs    01/06/23 0926 01/07/23 0425 01/08/23 0413 01/16/23 0000 05/24/23 0701 05/25/23 0603 05/26/23 0540 06/02/23 0000 06/18/23 0000  NA 134* 134* 135   < > 133* 132* 133* 137 131*  K 3.6 3.6 3.1*   < > 3.7 3.5 3.3* 3.0* 3.4*  CL 106 106 106   < > 103 104 106 107 105  CO2 22 19* 22   < > 21* 22 20* 24* 23*  GLUCOSE 115* 89 95   < > 96 104* 93  --   --   BUN 14 13 13    < > 12 10 10 14 12   CREATININE 0.62 0.65 0.61   < > 0.80 0.79 0.67 0.6 0.9  CALCIUM  9.9 9.2 9.1   < > 9.7 9.2 9.3 9.8 9.9  MG 1.9 2.2 2.4   < > 1.4* 2.1 1.7  --   --   PHOS 2.0* 2.6 2.2*  --   --   --   --   --   --    < > = values in this interval not displayed.   Recent Labs    12/28/22 1256 01/01/23 0416 01/08/23 0413 01/16/23 0000 03/03/23 0000 05/22/23 1116 06/18/23 0000  AST 17  --  17    < > 12* 16 9*  ALT 14  --  11   < > 5* 7 5*  ALKPHOS 85  --  64   < > 76 77 89  BILITOT 0.8  --  0.6  --   --  0.8  --   PROT 6.5  --  5.6*  --   --  6.1*  --   ALBUMIN  3.0*   < > 2.7*   < > 3.2* 2.8* 3.3*   < > = values in this interval not displayed.   Recent Labs    05/24/23 0701 05/25/23 0603 05/26/23 0540 06/02/23 0000 06/18/23 0000 08/07/23 0000  WBC 7.6 6.7 6.8 4.8 4.9 5.5  NEUTROABS  --   --   --  2,568.00 2,891.00 3,757.00  HGB 10.7* 9.6* 9.2* 9.6* 10.8* 10.7*  HCT 33.2* 29.4* 28.8* 30* 33* 34*  MCV 84.3 83.1 84.7  --   --   --   PLT 206 183 181 211 198 197   Lab Results  Component Value Date   TSH 1.438 05/22/2023   Lab Results  Component Value Date   HGBA1C 6.0 (H) 10/13/2022   Lab Results  Component Value Date  CHOL 121 10/13/2022   HDL 44 10/13/2022   LDLCALC 62 10/13/2022   TRIG 74 10/13/2022   CHOLHDL 2.8 10/13/2022    Significant Diagnostic Results in last 30 days:  No results found.  Assessment/Plan  Lightheadedness Pt reports not feeling well Vitals stable  Will check ortho static vitals  Neuro exam unremarkable Will get labs , UA  H/O Recurrent UTI  Will check UA  Currently on cephelexin for ppx  Cough  improving Lungs clear to [facility] with robitussin q6 prn   30 min Total time spent for obtaining history,  performing a medically appropriate examination and evaluation, reviewing the tests,ordering  tests,  documenting clinical information in the electronic or other health record,  ,care coordination (not separately reported)

## 2023-08-25 ENCOUNTER — Encounter: Payer: Self-pay | Admitting: Sports Medicine

## 2023-08-25 ENCOUNTER — Encounter: Payer: Self-pay | Admitting: Nurse Practitioner

## 2023-08-25 DIAGNOSIS — G8929 Other chronic pain: Secondary | ICD-10-CM | POA: Diagnosis not present

## 2023-08-25 DIAGNOSIS — I10 Essential (primary) hypertension: Secondary | ICD-10-CM | POA: Diagnosis not present

## 2023-08-25 LAB — CBC AND DIFFERENTIAL
HCT: 31 — AB (ref 36–46)
Hemoglobin: 9.7 — AB (ref 12.0–16.0)
Platelets: 174 K/uL (ref 150–400)
WBC: 4.4

## 2023-08-25 LAB — COMPREHENSIVE METABOLIC PANEL WITH GFR
Albumin: 3.1 — AB (ref 3.5–5.0)
Globulin: 2.4
eGFR: 68

## 2023-08-25 LAB — BASIC METABOLIC PANEL WITH GFR
BUN: 20 (ref 4–21)
CO2: 23 — AB (ref 13–22)
Chloride: 109 — AB (ref 99–108)
Creatinine: 0.8 (ref 0.5–1.1)
Glucose: 85
Potassium: 3.9 meq/L (ref 3.5–5.1)
Sodium: 137 (ref 137–147)

## 2023-08-25 LAB — CBC: RBC: 3.5 — AB (ref 3.87–5.11)

## 2023-08-25 LAB — HEPATIC FUNCTION PANEL
ALT: 5 U/L — AB (ref 7–35)
AST: 8 — AB (ref 13–35)
Alkaline Phosphatase: 85 (ref 25–125)
Bilirubin, Total: 0.3

## 2023-09-02 DIAGNOSIS — N3000 Acute cystitis without hematuria: Secondary | ICD-10-CM | POA: Diagnosis not present

## 2023-09-02 DIAGNOSIS — R8271 Bacteriuria: Secondary | ICD-10-CM | POA: Diagnosis not present

## 2023-09-03 ENCOUNTER — Encounter: Payer: Self-pay | Admitting: Nurse Practitioner

## 2023-09-03 DIAGNOSIS — N3090 Cystitis, unspecified without hematuria: Secondary | ICD-10-CM | POA: Insufficient documentation

## 2023-09-15 DIAGNOSIS — M79671 Pain in right foot: Secondary | ICD-10-CM | POA: Diagnosis not present

## 2023-09-15 DIAGNOSIS — E1159 Type 2 diabetes mellitus with other circulatory complications: Secondary | ICD-10-CM | POA: Diagnosis not present

## 2023-09-15 DIAGNOSIS — B351 Tinea unguium: Secondary | ICD-10-CM | POA: Diagnosis not present

## 2023-09-15 DIAGNOSIS — L84 Corns and callosities: Secondary | ICD-10-CM | POA: Diagnosis not present

## 2023-09-15 DIAGNOSIS — M79672 Pain in left foot: Secondary | ICD-10-CM | POA: Diagnosis not present

## 2023-09-18 ENCOUNTER — Non-Acute Institutional Stay: Payer: Self-pay | Admitting: Nurse Practitioner

## 2023-09-18 ENCOUNTER — Encounter: Payer: Self-pay | Admitting: Nurse Practitioner

## 2023-09-18 DIAGNOSIS — Z8673 Personal history of transient ischemic attack (TIA), and cerebral infarction without residual deficits: Secondary | ICD-10-CM | POA: Diagnosis not present

## 2023-09-18 DIAGNOSIS — Z87898 Personal history of other specified conditions: Secondary | ICD-10-CM | POA: Diagnosis not present

## 2023-09-18 DIAGNOSIS — E876 Hypokalemia: Secondary | ICD-10-CM | POA: Diagnosis not present

## 2023-09-18 DIAGNOSIS — I4891 Unspecified atrial fibrillation: Secondary | ICD-10-CM | POA: Diagnosis not present

## 2023-09-18 DIAGNOSIS — I10 Essential (primary) hypertension: Secondary | ICD-10-CM | POA: Diagnosis not present

## 2023-09-18 DIAGNOSIS — M109 Gout, unspecified: Secondary | ICD-10-CM

## 2023-09-18 DIAGNOSIS — E785 Hyperlipidemia, unspecified: Secondary | ICD-10-CM | POA: Diagnosis not present

## 2023-09-18 DIAGNOSIS — K2100 Gastro-esophageal reflux disease with esophagitis, without bleeding: Secondary | ICD-10-CM

## 2023-09-18 DIAGNOSIS — B379 Candidiasis, unspecified: Secondary | ICD-10-CM | POA: Diagnosis not present

## 2023-09-18 NOTE — Progress Notes (Signed)
 Location:  [facility] Room Number: 801-A Place of Service:  ALF [phone]) Provider:  Adline Lysle OLEGARIO CARROLYN Cleotilde Olam, MD  Patient Care Team: Cleotilde Olam, MD as PCP - General (Family Medicine) Michele Richardson, DO as PCP - Cardiology (Cardiology) Porter Andrez JONELLE DEVONNA as Physician Assistant (Dermatology)  Extended Emergency Contact Information Primary Emergency Contact: Luana, [place], [place] [address] [place] Home Phone: [phone] Work Phone: [phone] Mobile Phone: [phone] Relation: Son Secondary Emergency Contact: Smyers,[name]  [place] Phone: [phone] Relation: Son  Code Status:  DNR Goals of care: Advanced Directive information    09/18/2023    8:58 AM  Advanced Directives  Does Patient Have a Medical Advance Directive? Yes  Type of Estate agent of [place];Living will;Out of facility DNR (pink MOST or yellow form)  Does patient want to make changes to medical advance directive? No - Patient declined  Copy of Healthcare Power of Attorney in Chart? Yes - validated most recent copy scanned in chart (See row information)     Chief Complaint  Patient presents with   Medical Management of Chronic Issues    Routine Visit    HPI:  Pt is a 83 y.o. female seen today for medical management of chronic diseases.     Hemorrhoids, using Anusol              Hospitalized 05/22/23-05/26/23 for AMS, f/u CBC/BMP. Negative CT head/MRI brain and metabolic workup and UA, treated with Rocephin  x3 days anyway. Unclear etiology.                            05/22/23 the patient was found in recliner in near fetus position, repetitively saying put my legs down, but yelling out when assistance attempted. The patient stated she cannot open her eyes when asked to. The patient is oriented to person, place. No noted focal weakness. She is afebrile, no O2 desaturation.                            Hx of UTI, oFF Keflex  for suppression therapy.              Hypokalemia, on  Kcl supplement, K 3.9 08/25/23  Hyponatremia, Na 137 08/25/23             GERD, on  Voquezna per GI 03/27/23  03/27/23 GI dc'd Pantoprazole , Famotidine , Carafage 04/01/23 f/u GI, dc'd Voquenza, restarted Pantoprazole , Famotidine , Sucralfate . Followed by GI, MRI and barium swallow study.               Anemia, added Vit B12(Vit B12 273 12/10/22) and Fe 01/15/23. 01/30/23 Cardiology dc'd ASA, Hgb 9.7 08/25/23             New onset Atrial Fibrillation -2D echo from 10/13/2022 with a EF of 60 to 65%, grade 1 DD and mildly dilated left atrium. -Patient with history of bradycardia arrhythmia for which she was sent to cardiology in May 2024. -Patient felt not a great candidate for long-term anticoagulation but fall risk now is lower due to debility per son. -Patient placed on Xarelto  which is preferred medication per family. -Patient has been placed on full dose Lovenox  in anticipation of bone marrow biopsy which was done 01/06/2023.   -Resuming Rivaroxaban  01/07/2023. 03/11/22 cardiology H&H, BMP q 6months  TSH 1.438 05/22/23              Saw oncology for MGUS, negative cone marrow biopsy, moderate risk of progression to IgM myeloma or [place] macroglobulinemia(19-37% in next 20 years), f/u.              Hydronephrosis: f/u urology for renal mass left 1.9x1.6cm             Hospitalized 12/22/22 for metabolic encephalopathy 2/2 to UTI, AKI, and hypercalcemia.              Dysphagia, f/u ST             Hospitalized 10/12/22-10/17/22 UTI, encephalopathy, acute CVA             OA R+L knee pain, f/u Ortho for Gel inj, ambulates with walker.              CVA resolved difficulty word finding, resumed Statin, off ASA, f/u stroke clinic, MRI small punctate infarct in the posteerior limb of R internal capsule, no focal weakness             Gout, stable, off Allopurinol              HLD, the patient desires to resume Atorvastatin  and  denied any allergic reaction, LDL 69 04/02/23             HTN, on Hydralazine ,  prn Clonidine              CXR 10/14/22 pulm edema/volume overload, not apparent, off prn Furosemide , BNP 950.8 10/14/22, Echo 60-65% EF 10/13/22             Constipation, stable, on Senna, MiraLax , Anusol for hemorrhoids.              Prediabetes Hgb A1c 6.0 10/13/22, TSH 1.293 10/12/22, ACR 242(<30)02/26/23             Positive anti CCP test, f/u Rheumatology              Bradycardia, Hx of, off Carvedilol               Hx of urinary retention, taking Tamsulosin .   Past Medical History:  Diagnosis Date   CKD (chronic kidney disease), stage II    nephrologist-- dr prescilla  [facility])   Diabetes mellitus type 2, diet-controlled (HCC)    followed by pcp---   (09-20-2019  per pt does not check blood sugar at home)   Fracture of humeral shaft, right, closed 04/25/2022   GERD (gastroesophageal reflux disease)    History of primary hyperparathyroidism    s/p  parathyroidectomy right inferior on 12-02-2012   Humerus fracture 04/25/2022   Hyperlipidemia    Hypertension    followd by nephrologist----  (09-20-2019  per pt had stress test done some time ago , unsure when/ where, thinks told ok)   OA (osteoarthritis)    Osteoporosis    Prosthetic joint infection (HCC)    followed by dr j. hatcher (ID)   left total shoulder arthroplasty 12/ 2016  ; 12/ 2020  revision with explant joint replacement and hemiarthroplasty;  completed 6 month antibiotic 05/ 2021   Vertigo    Past Surgical History:  Procedure Laterality Date   COLONOSCOPY     DILATATION & CURETTAGE/HYSTEROSCOPY WITH MYOSURE N/A 09/28/2019   Procedure: DILATATION & CURETTAGE/HYSTEROSCOPY WITH MYOSURE;  Surgeon: Gorge Ade, MD;  Location: [facility] [place];  Service: Gynecology;  Laterality: N/A;   EYE SURGERY  yrs  ago   laser surgery for retinal tear.  (unilateral , pt unsure which side)   ORIF HUMERUS FRACTURE Right 04/27/2022   Procedure:  OPEN REDUCTION INTERNAL FIXATION (ORIF) PROXIMAL HUMERUS FRACTURE;  Surgeon: Cristy Bonner DASEN, MD;  Location: [facility];  Service: Orthopedics;  Laterality: Right;   PARATHYROIDECTOMY N/A 12/02/2012   Procedure: PARATHYROIDECTOMY with frozen section ;  Surgeon: Krystal CHRISTELLA Spinner, MD;  Location: [facility];  Service: General;  Laterality: N/A;   REVERSE SHOULDER ARTHROPLASTY Right 04/27/2022   Procedure: REVERSE SHOULDER ARTHROPLASTY;  Surgeon: Cristy Bonner DASEN, MD;  Location: [facility];  Service: Orthopedics;  Laterality: Right;   SHOULDER INJECTION Left 07/27/2013   Procedure: SHOULDER INJECTION;  Surgeon: Toribio JULIANNA Chancy, MD;  Location: [facility] OR;  Service: Orthopedics;  Laterality: Left;   STERIOD INJECTION Right 02/14/2015   Procedure: RIGHT SHOULDER CORTISONE INJECTION;  Surgeon: Toribio JULIANNA Chancy, MD;  Location: [facility] OR;  Service: Orthopedics;  Laterality: Right;   TOTAL HIP ARTHROPLASTY Left 07/27/2013   Procedure: TOTAL HIP ARTHROPLASTY ANTERIOR APPROACH;  Surgeon: Toribio JULIANNA Chancy, MD;  Location: [facility] OR;  Service: Orthopedics;  Laterality: Left;   TOTAL SHOULDER ARTHROPLASTY Left 02/14/2015   Procedure: LEFT TOTAL SHOULDER ARTHROPLASTY;  Surgeon: Toribio JULIANNA Chancy, MD;  Location[facility] OR;  Service: Orthopedics;  Laterality: Left;   TOTAL SHOULDER REVISION Left 02/09/2019   Procedure: TOTAL SHOULDER REVISION;  Surgeon: Cristy Bonner DASEN, MD;  Location: [facility];  Service: Orthopedics;  Laterality: Left;    Allergies  Allergen Reactions   Abaloparatide Other (See Comments)    Burred vision, lightheaded (patient does not recall this in 2024)   Mobic  [Meloxicam ] Hypertension   Crestor [Rosuvastatin] Other (See Comments)    Muscle aches   Norvasc [Amlodipine Besylate] Swelling and Other (See Comments)    Ankle swelling   Simvastatin Other (See Comments)    Muscle aches, high LFTs   Sulfa Antibiotics Rash   Zetia [Ezetimibe] Other (See Comments)    Muscle aches    Outpatient Encounter Medications as of 09/18/2023  Medication  Sig   acetaminophen  (TYLENOL ) 325 MG tablet Take 650 mg by mouth 2 (two) times daily.   atorvastatin  (LIPITOR) 10 MG tablet Take 10 mg by mouth daily.   cloNIDine  (CATAPRES ) 0.1 MG tablet Take 0.1 mg by mouth daily as needed (for hypertension- if systolic b/p is over 160).   cyanocobalamin  1000 MCG tablet Take 1,000 mcg by mouth daily.   diclofenac Sodium (VOLTAREN) 1 % GEL Apply 2-4 g topically 4 (four) times daily.   docusate sodium  (COLACE) 100 MG capsule Take 100 mg by mouth 2 (two) times daily.   famotidine  (PEPCID ) 20 MG tablet Take 1 tablet (20 mg total) by mouth 2 (two) times daily.   ferrous sulfate  220 (44 Fe) MG/5ML solution Take 7.5 mLs by mouth 3 (three) times a week. Monday, Wednesday, Friday   hydrALAZINE  (APRESOLINE ) 100 MG tablet Take 1 tablet (100 mg total) by mouth 3 (three) times daily.   hydrocortisone (ANUSOL-HC) 2.5 % rectal cream Place 1 Application rectally 2 (two) times daily.   pantoprazole  (PROTONIX ) 40 MG tablet Take 1 tablet (40 mg total) by mouth 2 (two) times daily.   polyethylene glycol powder (MIRALAX ) 17 GM/SCOOP powder Take 17 g by mouth daily. mix in 8 oz water .   potassium chloride  (KLOR-CON  M) 10 MEQ tablet Take 1 tablet (10 mEq total) by mouth daily.   Rivaroxaban  (XARELTO ) 15 MG TABS tablet Take 1 tablet (15 mg total) by  mouth daily with supper.   senna-docusate (SENOKOT-S) 8.6-50 MG tablet Take 1 tablet by mouth 2 (two) times daily.   sucralfate  (CARAFATE ) 1 GM/10ML suspension Take 10 mLs (1 g total) by mouth 4 (four) times daily -  with meals and at bedtime. Give  1 hour before meals and at bedtime on an empt stomach   tamsulosin  (FLOMAX ) 0.4 MG CAPS capsule Take 1 capsule (0.4 mg total) by mouth daily.   Zinc Oxide 10 % OINT Apply 1 Application topically See admin instructions. 1 application to buttocks every shift for redness   [DISCONTINUED] allopurinol  (ZYLOPRIM ) 300 MG tablet Take 300 mg by mouth daily. (Patient not taking: Reported on 09/18/2023)    [DISCONTINUED] cephALEXin  (KEFLEX ) 250 MG capsule Take 250 mg by mouth at bedtime. (Patient not taking: Reported on 09/18/2023)   [DISCONTINUED] meclizine  (ANTIVERT ) 12.5 MG tablet Take 1 tablet (12.5 mg total) by mouth 3 (three) times daily as needed for dizziness. (Patient not taking: Reported on 09/18/2023)   No facility-administered encounter medications on file as of 09/18/2023.    Review of Systems  Constitutional:  Negative for appetite change, fatigue and fever.  HENT:  Positive for hearing loss. Negative for congestion and trouble swallowing.   Eyes:  Negative for visual disturbance.  Respiratory:  Negative for cough and shortness of breath.   Cardiovascular:  Negative for leg swelling.  Gastrointestinal:  Negative for abdominal pain and constipation.       GERD symptoms intermittently  Genitourinary:  Positive for frequency. Negative for difficulty urinating, dysuria and urgency.       Nocturnal urination 2x average.  Incontinent of urine occasionally   Musculoskeletal:  Positive for arthralgias and gait problem.       R+L knee pain, R+L shoulder limited overhead ROM, c/o pain in the R shoulder, mild.   Skin:  Negative for color change.  Neurological:  Negative for speech difficulty, weakness and headaches.  Psychiatric/Behavioral:  Positive for sleep disturbance. Negative for confusion. The patient is not nervous/anxious.        Sometimes not sleeping, declined sleeping aid.     Immunization History  Administered Date(s) Administered   Influenza Split 11/24/2008   Influenza, High Dose Seasonal PF 03/16/2014, 01/14/2016, 01/21/2017, 02/24/2022   Influenza,inj,Quad PF,6+ Mos 11/20/2010, 02/01/2013   Influenza-Unspecified 11/20/2010, 02/01/2013, 02/19/2018, 02/24/2022   Moderna Sars-Covid-2 Vaccination 04/23/2019, 05/21/2019, 02/20/2020   Pneumococcal Conjugate-13 03/16/2014   Pneumococcal Polysaccharide-23 04/28/2005   Td 04/28/2005   Td (Adult) 04/28/2005   Tdap 06/08/2017    Zoster Recombinant(Shingrix) 01/14/2019   Zoster, Live 07/21/2007   Pertinent  Health Maintenance Due  Topic Date Due   OPHTHALMOLOGY EXAM  Never done   HEMOGLOBIN A1C  04/15/2023   FOOT EXAM  02/20/2024   DEXA SCAN  Completed   INFLUENZA VACCINE  Discontinued      04/12/2019    2:21 PM 07/19/2019    2:20 PM 09/28/2019    8:51 AM 06/21/2020    5:59 PM 09/23/2021    1:10 PM  Fall Risk  Falls in the past year? 0  0      (RETIRED) Patient Fall Risk Level Low fall risk  Low fall risk  Moderate fall risk  Low fall risk  Low fall risk   Fall risk Follow up Falls evaluation completed  Falls evaluation completed         Data saved with a previous flowsheet row definition   Functional Status Survey:    Vitals:   09/18/23  0857 09/18/23 1508  BP: (!) 137/53 (!) 130/58  Pulse: 75   Resp: 18   Temp: 97.7 F (36.5 C)   SpO2: 96%   Weight: 121 lb 12.8 oz (55.2 kg)   Height: 5' 4 (1.626 m)    Body mass index is 20.91 kg/m. Physical Exam Vitals and nursing note reviewed.  Constitutional:      Appearance: Normal appearance.  HENT:     Head: Normocephalic and atraumatic.     Right Ear: Tympanic membrane, ear canal and external ear normal. There is no impacted cerumen.     Left Ear: External ear normal. There is impacted cerumen.     Nose: Nose normal.     Mouth/Throat:     Mouth: Mucous membranes are moist.  Eyes:     Extraocular Movements: Extraocular movements intact.     Conjunctiva/sclera: Conjunctivae normal.     Pupils: Pupils are equal, round, and reactive to light.  Cardiovascular:     Rate and Rhythm: Normal rate and regular rhythm.     Heart sounds: No murmur heard. Pulmonary:     Effort: Pulmonary effort is normal.     Breath sounds: Rales present. No wheezing or rhonchi.     Comments: Bibasilar rales.  Abdominal:     Palpations: Abdomen is soft.     Tenderness: There is no abdominal tenderness.  Genitourinary:    Comments: External hemorrhoid 7pm, no bleeding  upon my examination.  Musculoskeletal:        General: No tenderness.     Cervical back: Normal range of motion and neck supple.     Right lower leg: No edema.     Left lower leg: No edema.     Comments: R+L shoulder limited overhead ROM, mild R shoulder pain. S/p R+L shoulder replacement.   Skin:    General: Skin is warm and dry.     Findings: Erythema present.     Comments: Sacral coccyx redness, itching, blanchable.   Neurological:     General: No focal deficit present.     Mental Status: She is alert and oriented to person, place, and time. Mental status is at baseline.     Gait: Gait abnormal.     Comments: Ambulates with walker.   Psychiatric:        Mood and Affect: Mood normal.        Behavior: Behavior normal.        Thought Content: Thought content normal.     Labs reviewed: Recent Labs    01/06/23 0926 01/07/23 0425 01/08/23 0413 01/16/23 0000 05/24/23 0701 05/25/23 0603 05/26/23 0540 06/02/23 0000 06/18/23 0000 08/25/23 0000  NA 134* 134* 135   < > 133* 132* 133* 137 131* 137  K 3.6 3.6 3.1*   < > 3.7 3.5 3.3* 3.0* 3.4* 3.9  CL 106 106 106   < > 103 104 106 107 105 109*  CO2 22 19* 22   < > 21* 22 20* 24* 23* 23*  GLUCOSE 115* 89 95   < > 96 104* 93  --   --   --   BUN 14 13 13    < > 12 10 10 14 12 20   CREATININE 0.62 0.65 0.61   < > 0.80 0.79 0.67 0.6 0.9 0.8  CALCIUM  9.9 9.2 9.1   < > 9.7 9.2 9.3 9.8 9.9  --   MG 1.9 2.2 2.4   < > 1.4* 2.1 1.7  --   --   --  PHOS 2.0* 2.6 2.2*  --   --   --   --   --   --   --    < > = values in this interval not displayed.   Recent Labs    12/28/22 1256 01/01/23 0416 01/08/23 0413 01/16/23 0000 05/22/23 1116 06/18/23 0000 08/25/23 0000  AST 17  --  17   < > 16 9* 8*  ALT 14  --  11   < > 7 5* 5*  ALKPHOS 85  --  64   < > 77 89 85  BILITOT 0.8  --  0.6  --  0.8  --   --   PROT 6.5  --  5.6*  --  6.1*  --   --   ALBUMIN  3.0*   < > 2.7*   < > 2.8* 3.3* 3.1*   < > = values in this interval not displayed.    Recent Labs    05/24/23 0701 05/25/23 0603 05/26/23 0540 06/02/23 0000 06/18/23 0000 08/07/23 0000 08/25/23 0000  WBC 7.6 6.7 6.8 4.8 4.9 5.5 4.4  NEUTROABS  --   --   --  2,568.00 2,891.00 3,757.00  --   HGB 10.7* 9.6* 9.2* 9.6* 10.8* 10.7* 9.7*  HCT 33.2* 29.4* 28.8* 30* 33* 34* 31*  MCV 84.3 83.1 84.7  --   --   --   --   PLT 206 183 181 211 198 197 174   Lab Results  Component Value Date   TSH 1.438 05/22/2023   Lab Results  Component Value Date   HGBA1C 6.0 (H) 10/13/2022   Lab Results  Component Value Date   CHOL 121 10/13/2022   HDL 44 10/13/2022   LDLCALC 62 10/13/2022   TRIG 74 10/13/2022   CHOLHDL 2.8 10/13/2022    Significant Diagnostic Results in last 30 days:  No results found.  Assessment/Plan Hypokalemia on  Kcl supplement, K 3.9 08/25/23  Gout Stable, off allopurinol   Reflux esophagitis Pantoprazole , Famotidine , Sucralfate . Followed by GI  Atrial fibrillation (HCC) Heart rate limits controlled, continue Xarelto , followed by cardiologist  History of stroke  CVA resolved difficulty word finding, resumed Statin, off ASA, f/u stroke clinic, MRI small punctate infarct in the posteerior limb of R internal capsule, no focal weakness  Hyperlipidemia  the patient desires to resume Atorvastatin  and denied any allergic reaction, LDL 69 04/02/23  Essential hypertension Blood pressure is controlled,  on Hydralazine ,  prn Clonidine   History of urinary retention Stable, Hx of urinary retention, taking Tamsulosin .   Candidiasis Pressure and moist are contributory, Sacral coccyx redness, itching, blanchable. Apply 0.1% triamcinolone  cream/Nystatin cream daily to affected area until healed.      Family/ staff Communication: Plan of care reviewed with the patient.  Charge nurse  Labs/tests ordered: None

## 2023-09-18 NOTE — Assessment & Plan Note (Signed)
 the patient desires to resume Atorvastatin  and denied any allergic reaction, LDL 69 04/02/23

## 2023-09-18 NOTE — Assessment & Plan Note (Signed)
 Heart rate limits controlled, continue Xarelto , followed by cardiologist

## 2023-09-18 NOTE — Assessment & Plan Note (Signed)
 on  Kcl supplement, K 3.9 08/25/23

## 2023-09-18 NOTE — Assessment & Plan Note (Signed)
 Pantoprazole, Famotidine, Sucralfate. Followed by GI

## 2023-09-18 NOTE — Assessment & Plan Note (Signed)
 Blood pressure is controlled, on Hydralazine,  prn Clonidine

## 2023-09-18 NOTE — Assessment & Plan Note (Signed)
 Stable, Hx of urinary retention, taking Tamsulosin .

## 2023-09-18 NOTE — Assessment & Plan Note (Signed)
 Stable, off allopurinol 

## 2023-09-18 NOTE — Assessment & Plan Note (Signed)
 CVA resolved difficulty word finding, resumed Statin, off ASA, f/u stroke clinic, MRI small punctate infarct in the posteerior limb of R internal capsule, no focal weakness

## 2023-09-18 NOTE — Assessment & Plan Note (Signed)
 Pressure and moist are contributory, Sacral coccyx redness, itching, blanchable. Apply 0.1% triamcinolone  cream/Nystatin cream daily to affected area until healed.

## 2023-09-24 DIAGNOSIS — N3941 Urge incontinence: Secondary | ICD-10-CM | POA: Diagnosis not present

## 2023-09-24 DIAGNOSIS — R350 Frequency of micturition: Secondary | ICD-10-CM | POA: Diagnosis not present

## 2023-09-24 DIAGNOSIS — R351 Nocturia: Secondary | ICD-10-CM | POA: Diagnosis not present

## 2023-10-01 DIAGNOSIS — N3000 Acute cystitis without hematuria: Secondary | ICD-10-CM | POA: Diagnosis not present

## 2023-10-01 DIAGNOSIS — E785 Hyperlipidemia, unspecified: Secondary | ICD-10-CM | POA: Diagnosis not present

## 2023-10-01 DIAGNOSIS — N3941 Urge incontinence: Secondary | ICD-10-CM | POA: Diagnosis not present

## 2023-10-01 DIAGNOSIS — R350 Frequency of micturition: Secondary | ICD-10-CM | POA: Diagnosis not present

## 2023-10-01 DIAGNOSIS — N952 Postmenopausal atrophic vaginitis: Secondary | ICD-10-CM | POA: Diagnosis not present

## 2023-10-12 DIAGNOSIS — R29898 Other symptoms and signs involving the musculoskeletal system: Secondary | ICD-10-CM | POA: Diagnosis not present

## 2023-10-12 DIAGNOSIS — R2681 Unsteadiness on feet: Secondary | ICD-10-CM | POA: Diagnosis not present

## 2023-10-12 DIAGNOSIS — M6281 Muscle weakness (generalized): Secondary | ICD-10-CM | POA: Diagnosis not present

## 2023-10-12 DIAGNOSIS — M25562 Pain in left knee: Secondary | ICD-10-CM | POA: Diagnosis not present

## 2023-10-15 DIAGNOSIS — R2681 Unsteadiness on feet: Secondary | ICD-10-CM | POA: Diagnosis not present

## 2023-10-15 DIAGNOSIS — M6281 Muscle weakness (generalized): Secondary | ICD-10-CM | POA: Diagnosis not present

## 2023-10-15 DIAGNOSIS — M25562 Pain in left knee: Secondary | ICD-10-CM | POA: Diagnosis not present

## 2023-10-15 DIAGNOSIS — R29898 Other symptoms and signs involving the musculoskeletal system: Secondary | ICD-10-CM | POA: Diagnosis not present

## 2023-10-20 DIAGNOSIS — R29898 Other symptoms and signs involving the musculoskeletal system: Secondary | ICD-10-CM | POA: Diagnosis not present

## 2023-10-20 DIAGNOSIS — R2681 Unsteadiness on feet: Secondary | ICD-10-CM | POA: Diagnosis not present

## 2023-10-20 DIAGNOSIS — M6281 Muscle weakness (generalized): Secondary | ICD-10-CM | POA: Diagnosis not present

## 2023-10-20 DIAGNOSIS — M25562 Pain in left knee: Secondary | ICD-10-CM | POA: Diagnosis not present

## 2023-10-22 DIAGNOSIS — M25562 Pain in left knee: Secondary | ICD-10-CM | POA: Diagnosis not present

## 2023-10-22 DIAGNOSIS — R29898 Other symptoms and signs involving the musculoskeletal system: Secondary | ICD-10-CM | POA: Diagnosis not present

## 2023-10-22 DIAGNOSIS — M6281 Muscle weakness (generalized): Secondary | ICD-10-CM | POA: Diagnosis not present

## 2023-10-22 DIAGNOSIS — R2681 Unsteadiness on feet: Secondary | ICD-10-CM | POA: Diagnosis not present

## 2023-10-27 DIAGNOSIS — M25562 Pain in left knee: Secondary | ICD-10-CM | POA: Diagnosis not present

## 2023-10-27 DIAGNOSIS — R29898 Other symptoms and signs involving the musculoskeletal system: Secondary | ICD-10-CM | POA: Diagnosis not present

## 2023-10-27 DIAGNOSIS — R2681 Unsteadiness on feet: Secondary | ICD-10-CM | POA: Diagnosis not present

## 2023-10-27 DIAGNOSIS — M6281 Muscle weakness (generalized): Secondary | ICD-10-CM | POA: Diagnosis not present

## 2023-10-29 DIAGNOSIS — M6281 Muscle weakness (generalized): Secondary | ICD-10-CM | POA: Diagnosis not present

## 2023-10-29 DIAGNOSIS — R2681 Unsteadiness on feet: Secondary | ICD-10-CM | POA: Diagnosis not present

## 2023-10-29 DIAGNOSIS — M25562 Pain in left knee: Secondary | ICD-10-CM | POA: Diagnosis not present

## 2023-10-29 DIAGNOSIS — R29898 Other symptoms and signs involving the musculoskeletal system: Secondary | ICD-10-CM | POA: Diagnosis not present

## 2023-11-03 DIAGNOSIS — M25562 Pain in left knee: Secondary | ICD-10-CM | POA: Diagnosis not present

## 2023-11-03 DIAGNOSIS — R29898 Other symptoms and signs involving the musculoskeletal system: Secondary | ICD-10-CM | POA: Diagnosis not present

## 2023-11-03 DIAGNOSIS — R2681 Unsteadiness on feet: Secondary | ICD-10-CM | POA: Diagnosis not present

## 2023-11-03 DIAGNOSIS — M6281 Muscle weakness (generalized): Secondary | ICD-10-CM | POA: Diagnosis not present

## 2023-11-05 DIAGNOSIS — R351 Nocturia: Secondary | ICD-10-CM | POA: Diagnosis not present

## 2023-11-05 DIAGNOSIS — N3941 Urge incontinence: Secondary | ICD-10-CM | POA: Diagnosis not present

## 2023-11-05 DIAGNOSIS — M6281 Muscle weakness (generalized): Secondary | ICD-10-CM | POA: Diagnosis not present

## 2023-11-05 DIAGNOSIS — R2681 Unsteadiness on feet: Secondary | ICD-10-CM | POA: Diagnosis not present

## 2023-11-05 DIAGNOSIS — N952 Postmenopausal atrophic vaginitis: Secondary | ICD-10-CM | POA: Diagnosis not present

## 2023-11-05 DIAGNOSIS — M25562 Pain in left knee: Secondary | ICD-10-CM | POA: Diagnosis not present

## 2023-11-05 DIAGNOSIS — R29898 Other symptoms and signs involving the musculoskeletal system: Secondary | ICD-10-CM | POA: Diagnosis not present

## 2023-11-10 DIAGNOSIS — R2681 Unsteadiness on feet: Secondary | ICD-10-CM | POA: Diagnosis not present

## 2023-11-10 DIAGNOSIS — M6281 Muscle weakness (generalized): Secondary | ICD-10-CM | POA: Diagnosis not present

## 2023-11-10 DIAGNOSIS — R29898 Other symptoms and signs involving the musculoskeletal system: Secondary | ICD-10-CM | POA: Diagnosis not present

## 2023-11-10 DIAGNOSIS — M25562 Pain in left knee: Secondary | ICD-10-CM | POA: Diagnosis not present

## 2023-11-17 DIAGNOSIS — R2681 Unsteadiness on feet: Secondary | ICD-10-CM | POA: Diagnosis not present

## 2023-11-17 DIAGNOSIS — R29898 Other symptoms and signs involving the musculoskeletal system: Secondary | ICD-10-CM | POA: Diagnosis not present

## 2023-11-17 DIAGNOSIS — M25562 Pain in left knee: Secondary | ICD-10-CM | POA: Diagnosis not present

## 2023-11-19 DIAGNOSIS — R29898 Other symptoms and signs involving the musculoskeletal system: Secondary | ICD-10-CM | POA: Diagnosis not present

## 2023-11-19 DIAGNOSIS — M6281 Muscle weakness (generalized): Secondary | ICD-10-CM | POA: Diagnosis not present

## 2023-11-19 DIAGNOSIS — M25562 Pain in left knee: Secondary | ICD-10-CM | POA: Diagnosis not present

## 2023-11-19 DIAGNOSIS — R2681 Unsteadiness on feet: Secondary | ICD-10-CM | POA: Diagnosis not present

## 2023-11-22 DIAGNOSIS — G8929 Other chronic pain: Secondary | ICD-10-CM | POA: Diagnosis not present

## 2023-11-22 DIAGNOSIS — R799 Abnormal finding of blood chemistry, unspecified: Secondary | ICD-10-CM | POA: Diagnosis not present

## 2023-11-23 ENCOUNTER — Encounter: Payer: Self-pay | Admitting: Sports Medicine

## 2023-11-23 ENCOUNTER — Non-Acute Institutional Stay: Payer: Self-pay | Admitting: Sports Medicine

## 2023-11-23 DIAGNOSIS — I10 Essential (primary) hypertension: Secondary | ICD-10-CM

## 2023-11-23 DIAGNOSIS — N390 Urinary tract infection, site not specified: Secondary | ICD-10-CM | POA: Diagnosis not present

## 2023-11-23 DIAGNOSIS — K219 Gastro-esophageal reflux disease without esophagitis: Secondary | ICD-10-CM | POA: Diagnosis not present

## 2023-11-23 NOTE — Progress Notes (Signed)
 Location:  [facility]ing Home Room Number: AL [id number] Place of Service:  ALF [phone]) Provider:  Sherlynn Albert, MD   Cleotilde Planas, MD  Patient Care Team: Cleotilde Planas, MD as PCP - General (Family Medicine) Michele Richardson, DO as PCP - Cardiology (Cardiology) Porter Andrez SAUNDERS, PA-C (Inactive) as Physician Assistant (Dermatology)  Extended Emergency Contact Information Primary Emergency Contact: Alexandrina, [place], [place] [address] [place] Home Phone: [phone] Work Phone: [phone] Mobile Phone: [phone] Relation: Son Secondary Emergency Contact: Ostrosky,[name]  [place] Phone: [phone] Relation: Son  Code Status:  DNR  Goals of care: Advanced Directive information    09/18/2023    8:58 AM  Advanced Directives  Does Patient Have a Medical Advance Directive? Yes  Type of Estate agent of [place];Living will;Out of facility DNR (pink MOST or yellow form)  Does patient want to make changes to medical advance directive? No - Patient declined  Copy of Healthcare Power of Attorney in Chart? Yes - validated most recent copy scanned in chart (See row information)     Chief Complaint  Patient presents with   Acute Visit    UTI     HPI:  Pt is a 83 y.o. female with PMH of recurrent UTI, Afib, CVA is seen today for an acute visit for acid reflux. Pt seen and examined in her room, she is sitting in recliner chair watching TV.  Pt states she is having acid reflux and feels nauseous, she did not eat much of her breakfast.  Pt denies chest pain, palpitations, SOB, dizzy or lightheadedness. C/o mild lower abdominal pain but denies dysuria.  Labs , UA sent this morning Pt is able to speak in full sentences, does not appear to be in distress.     Past Medical History:  Diagnosis Date   CKD (chronic kidney disease), stage II    nephrologist-- dr prescilla  [facility])    Diabetes mellitus type 2, diet-controlled (HCC)    followed by pcp---   (09-20-2019  per pt does not check blood sugar at home)   Fracture of humeral shaft, right, closed 04/25/2022   GERD (gastroesophageal reflux disease)    History of primary hyperparathyroidism    s/p  parathyroidectomy right inferior on 12-02-2012   Humerus fracture 04/25/2022   Hyperlipidemia    Hypertension    followd by nephrologist----  (09-20-2019  per pt had stress test done some time ago , unsure when/ where, thinks told ok)   OA (osteoarthritis)    Osteoporosis    Prosthetic joint infection (HCC)    followed by dr j. hatcher (ID)   left total shoulder arthroplasty 12/ 2016  ; 12/ 2020  revision with explant joint replacement and hemiarthroplasty;  completed 6 month antibiotic 05/ 2021   Vertigo    Past Surgical History:  Procedure Laterality Date   COLONOSCOPY     DILATATION & CURETTAGE/HYSTEROSCOPY WITH MYOSURE N/A 09/28/2019   Procedure: DILATATION & CURETTAGE/HYSTEROSCOPY WITH MYOSURE;  Surgeon: Gorge Ade, MD;  Location: [facility];  Service: Gynecology;  Laterality: N/A;   EYE SURGERY  yrs ago   laser surgery for retinal tear.  (unilateral , pt unsure which side)   ORIF HUMERUS FRACTURE Right 04/27/2022   Procedure: OPEN REDUCTION INTERNAL FIXATION (ORIF) PROXIMAL HUMERUS FRACTURE;  Surgeon: Cristy Bonner DASEN, MD;  Location: [facility];  Service: Orthopedics;  Laterality: Right;  PARATHYROIDECTOMY N/A 12/02/2012   Procedure: PARATHYROIDECTOMY with frozen section ;  Surgeon: Krystal CHRISTELLA Spinner, MD;  Location: [facility];  Service: General;  Laterality: N/A;   REVERSE SHOULDER ARTHROPLASTY Right 04/27/2022   Procedure: REVERSE SHOULDER ARTHROPLASTY;  Surgeon: Cristy Bonner DASEN, MD;  Location: [facility];  Service: Orthopedics;  Laterality: Right;   SHOULDER INJECTION Left 07/27/2013   Procedure: SHOULDER INJECTION;  Surgeon: Toribio JULIANNA Chancy, MD;  Location: [facility];  Service: Orthopedics;  Laterality: Left;    STERIOD INJECTION Right 02/14/2015   Procedure: RIGHT SHOULDER CORTISONE INJECTION;  Surgeon: Toribio JULIANNA Chancy, MD;  Location: [facility] OR;  Service: Orthopedics;  Laterality: Right;   TOTAL HIP ARTHROPLASTY Left 07/27/2013   Procedure: TOTAL HIP ARTHROPLASTY ANTERIOR APPROACH;  Surgeon: Toribio JULIANNA Chancy, MD;  Location: [facility] OR;  Service: Orthopedics;  Laterality: Left;   TOTAL SHOULDER ARTHROPLASTY Left 02/14/2015   Procedure: LEFT TOTAL SHOULDER ARTHROPLASTY;  Surgeon: Toribio JULIANNA Chancy, MD;  Location: [facility];  Service: Orthopedics;  Laterality: Left;   TOTAL SHOULDER REVISION Left 02/09/2019   Procedure: TOTAL SHOULDER REVISION;  Surgeon: Cristy Bonner DASEN, MD;  Location: [facility];  Service: Orthopedics;  Laterality: Left;    Allergies  Allergen Reactions   Abaloparatide Other (See Comments)    Burred vision, lightheaded (patient does not recall this in 2024)   Mobic  [Meloxicam ] Hypertension   Crestor [Rosuvastatin] Other (See Comments)    Muscle aches   Norvasc [Amlodipine Besylate] Swelling and Other (See Comments)    Ankle swelling   Simvastatin Other (See Comments)    Muscle aches, high LFTs   Sulfa Antibiotics Rash   Zetia [Ezetimibe] Other (See Comments)    Muscle aches    Allergies as of 11/23/2023       Reactions   Abaloparatide Other (See Comments)   Burred vision, lightheaded (patient does not recall this in 2024)   Mobic  [meloxicam ] Hypertension   Crestor [rosuvastatin] Other (See Comments)   Muscle aches   Norvasc [amlodipine Besylate] Swelling, Other (See Comments)   Ankle swelling   Simvastatin Other (See Comments)   Muscle aches, high LFTs   Sulfa Antibiotics Rash   Zetia [ezetimibe] Other (See Comments)   Muscle aches        Medication List        Accurate as of November 23, 2023 10:26 AM. If you have any questions, ask your nurse or doctor.          acetaminophen  325 MG tablet Commonly known as: TYLENOL  Take 650 mg by mouth 2 (two) times daily.    atorvastatin  10 MG tablet Commonly known as: LIPITOR Take 10 mg by mouth daily.   cloNIDine  0.1 MG tablet Commonly known as: CATAPRES  Take 0.1 mg by mouth daily as needed (for hypertension- if systolic b/p is over 160).   cyanocobalamin  1000 MCG tablet Take 1,000 mcg by mouth daily.   diclofenac Sodium 1 % Gel Commonly known as: VOLTAREN Apply 2-4 g topically 4 (four) times daily.   docusate sodium  100 MG capsule Commonly known as: COLACE Take 100 mg by mouth 2 (two) times daily.   famotidine  20 MG tablet Commonly known as: PEPCID  Take 1 tablet (20 mg total) by mouth 2 (two) times daily.   ferrous sulfate  220 (44 Fe) MG/5ML solution Take 7.5 mLs by mouth 3 (three) times a week. Monday, Wednesday, Friday   hydrALAZINE  100 MG tablet Commonly known as: APRESOLINE  Take 1 tablet (100 mg total) by mouth 3 (three) times daily.  hydrocortisone 2.5 % rectal cream Commonly known as: ANUSOL-HC Place 1 Application rectally 2 (two) times daily.   MiraLax  17 GM/SCOOP powder Generic drug: polyethylene glycol powder Take 17 g by mouth daily. mix in 8 oz water .   pantoprazole  40 MG tablet Commonly known as: PROTONIX  Take 1 tablet (40 mg total) by mouth 2 (two) times daily.   potassium chloride  10 MEQ tablet Commonly known as: KLOR-CON  M Take 1 tablet (10 mEq total) by mouth daily.   Rivaroxaban  15 MG Tabs tablet Commonly known as: XARELTO  Take 1 tablet (15 mg total) by mouth daily with supper.   senna-docusate 8.6-50 MG tablet Commonly known as: Senokot-S Take 1 tablet by mouth 2 (two) times daily.   sucralfate  1 GM/10ML suspension Commonly known as: Carafate  Take 10 mLs (1 g total) by mouth 4 (four) times daily -  with meals and at bedtime. Give  1 hour before meals and at bedtime on an empt stomach   tamsulosin  0.4 MG Caps capsule Commonly known as: FLOMAX  Take 1 capsule (0.4 mg total) by mouth daily.   Zinc Oxide 10 % Oint Apply 1 Application topically See admin  instructions. 1 application to buttocks every shift for redness        Review of Systems  Constitutional:  Negative for chills and fever.  Respiratory:  Negative for cough, shortness of breath and wheezing.   Cardiovascular:  Negative for chest pain and leg swelling.  Gastrointestinal:  Positive for nausea. Negative for abdominal pain, blood in stool, constipation, diarrhea and vomiting.  Genitourinary:  Negative for dysuria, frequency and urgency.  Neurological:  Negative for dizziness.  Psychiatric/Behavioral:  Negative for confusion.     Immunization History  Administered Date(s) Administered   INFLUENZA, HIGH DOSE SEASONAL PF 03/16/2014, 01/14/2016, 01/21/2017, 02/24/2022   Influenza Split 11/24/2008   Influenza,inj,Quad PF,6+ Mos 11/20/2010, 02/01/2013   Influenza-Unspecified 11/20/2010, 02/01/2013, 02/19/2018, 02/24/2022   Moderna Sars-Covid-2 Vaccination 04/23/2019, 05/21/2019, 02/20/2020   Pneumococcal Conjugate-13 03/16/2014   Pneumococcal Polysaccharide-23 04/28/2005   Td 04/28/2005   Td (Adult) 04/28/2005   Tdap 06/08/2017   Zoster Recombinant(Shingrix) 01/14/2019   Zoster, Live 07/21/2007   Pertinent  Health Maintenance Due  Topic Date Due   OPHTHALMOLOGY EXAM  Never done   HEMOGLOBIN A1C  04/15/2023   FOOT EXAM  02/20/2024   DEXA SCAN  Completed   Influenza Vaccine  Discontinued      04/12/2019    2:21 PM 07/19/2019    2:20 PM 09/28/2019    8:51 AM 06/21/2020    5:59 PM 09/23/2021    1:10 PM  Fall Risk  Falls in the past year? 0  0      (RETIRED) Patient Fall Risk Level Low fall risk  Low fall risk  Moderate fall risk  Low fall risk  Low fall risk   Fall risk Follow up Falls evaluation completed  Falls evaluation completed         Data saved with a previous flowsheet row definition   Functional Status Survey:    Vitals:   11/23/23 1025  BP: 138/70  Pulse: 72  Resp: 18  Temp: (!) 97.5 F (36.4 C)  SpO2: 96%  Weight: 119 lb 9.6 oz (54.3 kg)   Height: 5' 4 (1.626 m)   Body mass index is 20.53 kg/m. Physical Exam Constitutional:      Appearance: Normal appearance.  HENT:     Head: Normocephalic and atraumatic.  Cardiovascular:     Rate and Rhythm: Normal  rate and regular rhythm.  Pulmonary:     Effort: Pulmonary effort is normal. No respiratory distress.     Breath sounds: Normal breath sounds. No wheezing.  Abdominal:     General: Bowel sounds are normal. There is no distension.     Tenderness: There is no abdominal tenderness. There is no guarding or rebound.     Comments:    Musculoskeletal:        General: No swelling.  Neurological:     Mental Status: She is alert. Mental status is at baseline.     Motor: No weakness.     Labs reviewed: Recent Labs    01/06/23 0926 01/07/23 0425 01/08/23 0413 01/16/23 0000 05/24/23 0701 05/25/23 0603 05/26/23 0540 06/02/23 0000 06/18/23 0000 08/25/23 0000  NA 134* 134* 135   < > 133* 132* 133* 137 131* 137  K 3.6 3.6 3.1*   < > 3.7 3.5 3.3* 3.0* 3.4* 3.9  CL 106 106 106   < > 103 104 106 107 105 109*  CO2 22 19* 22   < > 21* 22 20* 24* 23* 23*  GLUCOSE 115* 89 95   < > 96 104* 93  --   --   --   BUN 14 13 13    < > 12 10 10 14 12 20   CREATININE 0.62 0.65 0.61   < > 0.80 0.79 0.67 0.6 0.9 0.8  CALCIUM  9.9 9.2 9.1   < > 9.7 9.2 9.3 9.8 9.9  --   MG 1.9 2.2 2.4   < > 1.4* 2.1 1.7  --   --   --   PHOS 2.0* 2.6 2.2*  --   --   --   --   --   --   --    < > = values in this interval not displayed.   Recent Labs    12/28/22 1256 01/01/23 0416 01/08/23 0413 01/16/23 0000 05/22/23 1116 06/18/23 0000 08/25/23 0000  AST 17  --  17   < > 16 9* 8*  ALT 14  --  11   < > 7 5* 5*  ALKPHOS 85  --  64   < > 77 89 85  BILITOT 0.8  --  0.6  --  0.8  --   --   PROT 6.5  --  5.6*  --  6.1*  --   --   ALBUMIN  3.0*   < > 2.7*   < > 2.8* 3.3* 3.1*   < > = values in this interval not displayed.   Recent Labs    05/24/23 0701 05/25/23 0603 05/26/23 0540 06/02/23 0000  06/18/23 0000 08/07/23 0000 08/25/23 0000  WBC 7.6 6.7 6.8 4.8 4.9 5.5 4.4  NEUTROABS  --   --   --  2,568.00 2,891.00 3,757.00  --   HGB 10.7* 9.6* 9.2* 9.6* 10.8* 10.7* 9.7*  HCT 33.2* 29.4* 28.8* 30* 33* 34* 31*  MCV 84.3 83.1 84.7  --   --   --   --   PLT 206 183 181 211 198 197 174   Lab Results  Component Value Date   TSH 1.438 05/22/2023   Lab Results  Component Value Date   HGBA1C 6.0 (H) 10/13/2022   Lab Results  Component Value Date   CHOL 121 10/13/2022   HDL 44 10/13/2022   LDLCALC 62 10/13/2022   TRIG 74 10/13/2022   CHOLHDL 2.8 10/13/2022    Significant Diagnostic Results  in last 30 days:  No results found.  Assessment/Plan   GERD  Pt c/o acid reflux Will add protonix   Cont with carafate  and famotidine  Avoid spicy foods Labs reviewed, improving  Recurrent UTI  Denies dysuria,hematuria Afebrile C/o mild lower abdominal discomfort  UA pending   HTN  Cont with hydralazine , losartan  Avoid salty foods

## 2023-11-24 DIAGNOSIS — B351 Tinea unguium: Secondary | ICD-10-CM | POA: Diagnosis not present

## 2023-11-24 DIAGNOSIS — E1159 Type 2 diabetes mellitus with other circulatory complications: Secondary | ICD-10-CM | POA: Diagnosis not present

## 2023-11-24 DIAGNOSIS — M79672 Pain in left foot: Secondary | ICD-10-CM | POA: Diagnosis not present

## 2023-11-24 DIAGNOSIS — M79671 Pain in right foot: Secondary | ICD-10-CM | POA: Diagnosis not present

## 2023-11-24 DIAGNOSIS — L84 Corns and callosities: Secondary | ICD-10-CM | POA: Diagnosis not present

## 2023-11-26 DIAGNOSIS — M6281 Muscle weakness (generalized): Secondary | ICD-10-CM | POA: Diagnosis not present

## 2023-11-26 DIAGNOSIS — R2681 Unsteadiness on feet: Secondary | ICD-10-CM | POA: Diagnosis not present

## 2023-11-26 DIAGNOSIS — R29898 Other symptoms and signs involving the musculoskeletal system: Secondary | ICD-10-CM | POA: Diagnosis not present

## 2023-12-01 DIAGNOSIS — M6281 Muscle weakness (generalized): Secondary | ICD-10-CM | POA: Diagnosis not present

## 2023-12-03 DIAGNOSIS — M25562 Pain in left knee: Secondary | ICD-10-CM | POA: Diagnosis not present

## 2023-12-03 DIAGNOSIS — M6281 Muscle weakness (generalized): Secondary | ICD-10-CM | POA: Diagnosis not present

## 2023-12-03 DIAGNOSIS — R2681 Unsteadiness on feet: Secondary | ICD-10-CM | POA: Diagnosis not present

## 2023-12-03 DIAGNOSIS — R29898 Other symptoms and signs involving the musculoskeletal system: Secondary | ICD-10-CM | POA: Diagnosis not present

## 2023-12-04 DIAGNOSIS — R351 Nocturia: Secondary | ICD-10-CM | POA: Diagnosis not present

## 2023-12-04 DIAGNOSIS — N3941 Urge incontinence: Secondary | ICD-10-CM | POA: Diagnosis not present

## 2023-12-04 DIAGNOSIS — N952 Postmenopausal atrophic vaginitis: Secondary | ICD-10-CM | POA: Diagnosis not present

## 2023-12-07 ENCOUNTER — Encounter: Payer: Self-pay | Admitting: Sports Medicine

## 2023-12-07 ENCOUNTER — Non-Acute Institutional Stay: Payer: Self-pay | Admitting: Sports Medicine

## 2023-12-07 DIAGNOSIS — I10 Essential (primary) hypertension: Secondary | ICD-10-CM

## 2023-12-07 DIAGNOSIS — I4891 Unspecified atrial fibrillation: Secondary | ICD-10-CM

## 2023-12-07 DIAGNOSIS — N390 Urinary tract infection, site not specified: Secondary | ICD-10-CM

## 2023-12-07 DIAGNOSIS — L89312 Pressure ulcer of right buttock, stage 2: Secondary | ICD-10-CM | POA: Diagnosis not present

## 2023-12-07 NOTE — Progress Notes (Signed)
 Provider:  Dr. Jackalyn Blazing Location:  [facility] Place of Service:   Assisted living  PCP: Cleotilde Planas, MD Patient Care Team: Cleotilde Planas, MD as PCP - General (Family Medicine) Michele Richardson, DO as PCP - Cardiology (Cardiology) Porter Andrez SAUNDERS, PA-C (Inactive) as Physician Assistant (Dermatology)  Extended Emergency Contact Information Primary Emergency Contact: [place], [place], [place] [address] [place] Home Phone: [phone] Work Phone: [phone] Mobile Phone: [phone] Relation: Son Secondary Emergency Contact: Watts,[name]  [place] Mobile Phone: [phone] Relation: Son  Goals of Care: Advanced Directive information    11/23/2023   10:28 AM  Advanced Directives  Does Patient Have a Medical Advance Directive? Yes  Type of Estate agent of [place];Out of facility DNR (pink MOST or yellow form)  Does patient want to make changes to medical advance directive? No - Patient declined  Copy of Healthcare Power of Attorney in Chart? Yes - validated most recent copy scanned in chart (See row information)        History of Present Illness  83 year old female with a past medical history of recurrent UTI, A-fib, CVA seen today for an acute visit for open wound on her buttock. Patient seen and examined in her room.  She seems pleasant and comfortable and does not appear to be in distress. She has tiny superficial pressure ulcer on her right buttock.  Patient complains of pain when sitting. Patient denies fevers, chills, cough, shortness of breath, abdominal pain, nausea, vomiting, dysuria, hematuria, bloody or dark-colored stools. Patient has a history of recurrent UTIs and recently follow-up with urology.  She is currently on Keflex .     Past Medical History:  Diagnosis Date   CKD (chronic kidney disease), stage II    nephrologist-- dr prescilla  [facility])    Diabetes mellitus type 2, diet-controlled (HCC)    followed by pcp---   (09-20-2019  per pt does not check blood sugar at home)   Fracture of humeral shaft, right, closed 04/25/2022   GERD (gastroesophageal reflux disease)    History of primary hyperparathyroidism    s/p  parathyroidectomy right inferior on 12-02-2012   Humerus fracture 04/25/2022   Hyperlipidemia    Hypertension    followd by nephrologist----  (09-20-2019  per pt had stress test done some time ago , unsure when/ where, thinks told ok)   OA (osteoarthritis)    Osteoporosis    Prosthetic joint infection    followed by dr j. hatcher (ID)   left total shoulder arthroplasty 12/ 2016  ; 12/ 2020  revision with explant joint replacement and hemiarthroplasty;  completed 6 month antibiotic 05/ 2021   Vertigo    Past Surgical History:  Procedure Laterality Date   COLONOSCOPY     DILATATION & CURETTAGE/HYSTEROSCOPY WITH MYOSURE N/A 09/28/2019   Procedure: DILATATION & CURETTAGE/HYSTEROSCOPY WITH MYOSURE;  Surgeon: Gorge Ade, MD;  Location: [facility] [place];  Service: Gynecology;  Laterality: N/A;   EYE SURGERY  yrs ago   laser surgery for retinal tear.  (unilateral , pt unsure which side)   ORIF HUMERUS FRACTURE Right 04/27/2022   Procedure: OPEN REDUCTION INTERNAL FIXATION (ORIF) PROXIMAL HUMERUS FRACTURE;  Surgeon: Cristy Bonner DASEN, MD;  Location: [facility];  Service: Orthopedics;  Laterality: Right;   PARATHYROIDECTOMY N/A 12/02/2012   Procedure: PARATHYROIDECTOMY with frozen section ;  Surgeon: Krystal CHRISTELLA Spinner, MD;  Location: [facility];  Service: General;  Laterality:  N/A;   REVERSE SHOULDER ARTHROPLASTY Right 04/27/2022   Procedure: REVERSE SHOULDER ARTHROPLASTY;  Surgeon: Cristy Bonner DASEN, MD;  Location: [facility];  Service: Orthopedics;  Laterality: Right;   SHOULDER INJECTION Left 07/27/2013   Procedure: SHOULDER INJECTION;  Surgeon: Toribio JULIANNA Chancy, MD;  Location[facility];  Service: Orthopedics;  Laterality: Left;   STERIOD  INJECTION Right 02/14/2015   Procedure: RIGHT SHOULDER CORTISONE INJECTION;  Surgeon: Toribio JULIANNA Chancy, MD;  Location: [facility] OR;  Service: Orthopedics;  Laterality: Right;   TOTAL HIP ARTHROPLASTY Left 07/27/2013   Procedure: TOTAL HIP ARTHROPLASTY ANTERIOR APPROACH;  Surgeon: Toribio JULIANNA Chancy, MD;  Location: [facility] OR;  Service: Orthopedics;  Laterality: Left;   TOTAL SHOULDER ARTHROPLASTY Left 02/14/2015   Procedure: LEFT TOTAL SHOULDER ARTHROPLASTY;  Surgeon: Toribio JULIANNA Chancy, MD;  Location: [facility];  Service: Orthopedics;  Laterality: Left;   TOTAL SHOULDER REVISION Left 02/09/2019   Procedure: TOTAL SHOULDER REVISION;  Surgeon: Cristy Bonner DASEN, MD;  Location: [facility];  Service: Orthopedics;  Laterality: Left;    reports that she quit smoking about 36 years ago. Her smoking use included cigarettes. She started smoking about 56 years ago. She has a 20 pack-year smoking history. She has never used smokeless tobacco. She reports that she does not drink alcohol and does not use drugs. Social History   Socioeconomic History   Marital status: Widowed    Spouse name: Not on file   Number of children: Not on file   Years of education: Not on file   Highest education level: Not on file  Occupational History   Not on file  Tobacco Use   Smoking status: Former    Current packs/day: 0.00    Average packs/day: 1 pack/day for 20.0 years (20.0 ttl pk-yrs)    Types: Cigarettes    Start date: 10/28/1967    Quit date: 10/28/1987    Years since quitting: 36.1   Smokeless tobacco: Never  Vaping Use   Vaping status: Never Used  Substance and Sexual Activity   Alcohol use: No   Drug use: Never   Sexual activity: Yes    Birth control/protection: Post-menopausal  Other Topics Concern   Not on file  Social History Narrative   Not on file   Social Drivers of Health   Financial Resource Strain: Not on file  Food Insecurity: No Food Insecurity (05/22/2023)   Hunger Vital Sign    Worried About Running Out of Food in  the Last Year: Never true    Ran Out of Food in the Last Year: Never true  Transportation Needs: No Transportation Needs (05/22/2023)   PRAPARE - Administrator, Civil Service (Medical): No    Lack of Transportation (Non-Medical): No  Physical Activity: Not on file  Stress: Not on file  Social Connections: Unknown (05/22/2023)   Social Connection and Isolation Panel    Frequency of Communication with Friends and Family: Patient unable to answer    Frequency of Social Gatherings with Friends and Family: Patient unable to answer    Attends Religious Services: Patient unable to answer    Active Member of Clubs or Organizations: Patient unable to answer    Attends Banker Meetings: Patient unable to answer    Marital Status: Widowed  Intimate Partner Violence: Not At Risk (05/22/2023)   Humiliation, Afraid, Rape, and Kick questionnaire    Fear of Current or Ex-Partner: No    Emotionally Abused: No    Physically Abused: No  Sexually Abused: No    Functional Status Survey:    Family History  Problem Relation Age of Onset   Heart failure Mother    Cancer Mother    Breast cancer Mother        in her 62s   Cancer Father     Health Maintenance  Topic Date Due   OPHTHALMOLOGY EXAM  Never done   HEMOGLOBIN A1C  04/15/2023   FOOT EXAM  02/20/2024   Medicare Annual Wellness (AWV)  02/20/2024   Diabetic kidney evaluation - Urine ACR  02/26/2024   Diabetic kidney evaluation - eGFR measurement  08/24/2024   DTaP/Tdap/Td (4 - Td or Tdap) 06/09/2027   Pneumococcal Vaccine: 50+ Years  Completed   DEXA SCAN  Completed   HPV VACCINES  Aged Out   Meningococcal B Vaccine  Aged Out   Influenza Vaccine  Discontinued   COVID-19 Vaccine  Discontinued   Zoster Vaccines- Shingrix  Discontinued    Allergies  Allergen Reactions   Abaloparatide Other (See Comments)    Burred vision, lightheaded (patient does not recall this in 2024)   Mobic  [Meloxicam ] Hypertension    Crestor [Rosuvastatin] Other (See Comments)    Muscle aches   Norvasc [Amlodipine Besylate] Swelling and Other (See Comments)    Ankle swelling   Simvastatin Other (See Comments)    Muscle aches, high LFTs   Sulfa Antibiotics Rash   Zetia [Ezetimibe] Other (See Comments)    Muscle aches    Outpatient Encounter Medications as of 12/07/2023  Medication Sig   acetaminophen  (TYLENOL ) 325 MG tablet Take 650 mg by mouth 2 (two) times daily.   atorvastatin  (LIPITOR) 10 MG tablet Take 10 mg by mouth daily.   cloNIDine  (CATAPRES ) 0.1 MG tablet Take 0.1 mg by mouth daily as needed (for hypertension- if systolic b/p is over 160).   cyanocobalamin  1000 MCG tablet Take 1,000 mcg by mouth daily.   diclofenac Sodium (VOLTAREN) 1 % GEL Apply 2-4 g topically 4 (four) times daily.   docusate sodium  (COLACE) 100 MG capsule Take 100 mg by mouth 2 (two) times daily.   estradiol (ESTRACE) 0.1 MG/GM vaginal cream Place 1 Applicatorful vaginally. Every Tuesday and Thursday   famotidine  (PEPCID ) 20 MG tablet Take 1 tablet (20 mg total) by mouth 2 (two) times daily.   ferrous sulfate  220 (44 Fe) MG/5ML solution Take 7.5 mLs by mouth 3 (three) times a week. Monday, Wednesday, Friday   hydrALAZINE  (APRESOLINE ) 100 MG tablet Take 1 tablet (100 mg total) by mouth 3 (three) times daily.   nystatin-triamcinolone  (MYCOLOG II) cream Apply 1 Application topically.   pantoprazole  (PROTONIX ) 40 MG tablet Take 1 tablet (40 mg total) by mouth 2 (two) times daily.   polyethylene glycol powder (MIRALAX ) 17 GM/SCOOP powder Take 17 g by mouth daily. mix in 8 oz water .   potassium chloride  (KLOR-CON  M) 10 MEQ tablet Take 1 tablet (10 mEq total) by mouth daily.   Rivaroxaban  (XARELTO ) 15 MG TABS tablet Take 1 tablet (15 mg total) by mouth daily with supper.   senna-docusate (SENOKOT-S) 8.6-50 MG tablet Take 1 tablet by mouth 2 (two) times daily.   sucralfate  (CARAFATE ) 1 GM/10ML suspension Take 10 mLs (1 g total) by mouth 4  (four) times daily -  with meals and at bedtime. Give  1 hour before meals and at bedtime on an empt stomach   Zinc Oxide 10 % OINT Apply 1 Application topically See admin instructions. 1 application to buttocks every shift  for redness   No facility-administered encounter medications on file as of 12/07/2023.    Review of Systems  Constitutional:  Negative for chills and fever.  Respiratory:  Negative for cough, shortness of breath and wheezing.   Cardiovascular:  Negative for chest pain, palpitations and leg swelling.  Gastrointestinal:  Negative for abdominal pain, blood in stool, constipation, diarrhea, nausea and vomiting.  Genitourinary:  Negative for dysuria.  Skin:  Positive for wound.  Neurological:  Negative for dizziness.   Negative unless indicated in HPI.  There were no vitals filed for this visit. There is no height or weight on file to calculate BMI. BP Readings from Last 3 Encounters:  11/23/23 138/70  09/18/23 (!) 130/58  08/24/23 123/61   Wt Readings from Last 3 Encounters:  11/23/23 119 lb 9.6 oz (54.3 kg)  09/18/23 121 lb 12.8 oz (55.2 kg)  08/24/23 120 lb 3.2 oz (54.5 kg)   Physical Exam Constitutional:      Appearance: Normal appearance.  HENT:     Head: Normocephalic and atraumatic.  Cardiovascular:     Rate and Rhythm: Normal rate and regular rhythm.  Pulmonary:     Effort: Pulmonary effort is normal. No respiratory distress.     Breath sounds: Normal breath sounds. No wheezing.  Abdominal:     General: Bowel sounds are normal. There is no distension.     Tenderness: There is no abdominal tenderness. There is no guarding or rebound.     Comments:    Musculoskeletal:        General: No swelling or tenderness.  Skin:    Comments: Rt buttock - 2cm superficial skin break down No surrounding erythema or drainage   Neurological:     Mental Status: She is alert. Mental status is at baseline.     Sensory: No sensory deficit.     Motor: No weakness.      Labs reviewed: Basic Metabolic Panel: Recent Labs    01/06/23 0926 01/07/23 0425 01/08/23 0413 01/16/23 0000 05/24/23 0701 05/25/23 0603 05/26/23 0540 06/02/23 0000 06/18/23 0000 08/25/23 0000  NA 134* 134* 135   < > 133* 132* 133* 137 131* 137  K 3.6 3.6 3.1*   < > 3.7 3.5 3.3* 3.0* 3.4* 3.9  CL 106 106 106   < > 103 104 106 107 105 109*  CO2 22 19* 22   < > 21* 22 20* 24* 23* 23*  GLUCOSE 115* 89 95   < > 96 104* 93  --   --   --   BUN 14 13 13    < > 12 10 10 14 12 20   CREATININE 0.62 0.65 0.61   < > 0.80 0.79 0.67 0.6 0.9 0.8  CALCIUM  9.9 9.2 9.1   < > 9.7 9.2 9.3 9.8 9.9  --   MG 1.9 2.2 2.4   < > 1.4* 2.1 1.7  --   --   --   PHOS 2.0* 2.6 2.2*  --   --   --   --   --   --   --    < > = values in this interval not displayed.   Liver Function Tests: Recent Labs    12/28/22 1256 01/01/23 0416 01/08/23 0413 01/16/23 0000 05/22/23 1116 06/18/23 0000 08/25/23 0000  AST 17  --  17   < > 16 9* 8*  ALT 14  --  11   < > 7 5* 5*  ALKPHOS 85  --  64   < > 77 89 85  BILITOT 0.8  --  0.6  --  0.8  --   --   PROT 6.5  --  5.6*  --  6.1*  --   --   ALBUMIN  3.0*   < > 2.7*   < > 2.8* 3.3* 3.1*   < > = values in this interval not displayed.   Recent Labs    05/22/23 1450  LIPASE 28   Recent Labs    12/28/22 1256 05/22/23 1450  AMMONIA <10 13   CBC: Recent Labs    05/24/23 0701 05/25/23 0603 05/26/23 0540 06/02/23 0000 06/18/23 0000 08/07/23 0000 08/25/23 0000  WBC 7.6 6.7 6.8 4.8 4.9 5.5 4.4  NEUTROABS  --   --   --  2,568.00 2,891.00 3,757.00  --   HGB 10.7* 9.6* 9.2* 9.6* 10.8* 10.7* 9.7*  HCT 33.2* 29.4* 28.8* 30* 33* 34* 31*  MCV 84.3 83.1 84.7  --   --   --   --   PLT 206 183 181 211 198 197 174   Cardiac Enzymes: No results for input(s): CKTOTAL, CKMB, CKMBINDEX, TROPONINI in the last 8760 hours. BNP: Invalid input(s): POCBNP Lab Results  Component Value Date   HGBA1C 6.0 (H) 10/13/2022   Lab Results  Component Value Date    TSH 1.438 05/22/2023   Lab Results  Component Value Date   VITAMINB12 1,459 (H) 05/22/2023   Lab Results  Component Value Date   FOLATE 11.7 01/03/2023   Lab Results  Component Value Date   IRON 16 01/19/2023   TIBC 155 01/19/2023   FERRITIN 88 01/19/2023    Imaging and Procedures obtained prior to SNF admission: MR BRAIN WO CONTRAST Result Date: 05/22/2023 CLINICAL DATA:  Mental status change EXAM: MRI HEAD WITHOUT CONTRAST TECHNIQUE: Multiplanar, multiecho pulse sequences of the brain and surrounding structures were obtained without intravenous contrast. COMPARISON:  Same-day head CT. FINDINGS: Brain: No acute infarct. No evidence of intracranial hemorrhage. Scattered and confluent FLAIR hyperintensity in the periventricular and subcortical white matter suggestive of moderate chronic microvascular ischemic changes. Additional FLAIR signal abnormality in the pons likely related to the same. Remote lacunar infarcts in the bilateral basal ganglia and right corona radiata. No mass lesion or midline shift. Cerebellum is unremarkable. Normal appearance of midline structures. The basilar cisterns are patent. No extra-axial fluid collections. Ventricles: Prominence of the lateral ventricles suggestive of underlying parenchymal volume loss. Vascular: Skull base flow voids are visualized. Skull and upper cervical spine: No focal abnormality. Sinuses/Orbits: Orbits are symmetric. Paranasal sinuses are clear. Other: Mastoid air cells are clear. IMPRESSION: No acute intracranial abnormality. Moderate chronic microvascular ischemic changes. Remote lacunar infarcts in the right corona radiata and bilateral basal ganglia. Mild generalized parenchymal volume loss. Electronically Signed   By: Donnice Mania M.D.   On: 05/22/2023 19:20   DG Chest Port 1 View Result Date: 05/22/2023 CLINICAL DATA:  83 year old female with questionable sepsis EXAM: PORTABLE CHEST 1 VIEW COMPARISON:  12/22/2022 FINDINGS:  Cardiomediastinal silhouette unchanged in size and contour. No evidence of central vascular congestion. No interlobular septal thickening. Low lung volumes. Opacity at the left lung base obscuring the left hemidiaphragm and the left heart border. No pneumothorax No acute displaced fracture. Surgical changes of the shoulders incompletely imaged IMPRESSION: Low lung volumes with opacity at the left lung base, potentially pneumonia/consolidation or atelectasis. Electronically Signed   By: Ami Bellman D.O.   On: 05/22/2023 13:55   CT  Head Wo Contrast Result Date: 05/22/2023 CLINICAL DATA:  Mental status change, altered mental status. EXAM: CT HEAD WITHOUT CONTRAST TECHNIQUE: Contiguous axial images were obtained from the base of the skull through the vertex without intravenous contrast. RADIATION DOSE REDUCTION: This exam was performed according to the departmental dose-optimization program which includes automated exposure control, adjustment of the mA and/or kV according to patient size and/or use of iterative reconstruction technique. COMPARISON:  MRI head 12/28/2022, CT head 10/12/2022 FINDINGS: Brain: No acute intracranial hemorrhage. No CT evidence of acute infarct. Nonspecific hypoattenuation in the periventricular and subcortical white matter favored to reflect chronic microvascular ischemic changes. Remote lacunar infarct in the right corona radiata. No edema, mass effect, or midline shift. The basilar cisterns are patent. Ventricles: The ventricles are normal. Vascular: Atherosclerotic calcifications of the carotid siphons and intracranial vertebral arteries. No hyperdense vessel. Skull: No acute or aggressive finding. Chronic appearing bilateral nasal bone deformities. Orbits: Orbits are symmetric. Sinuses: Mild mucosal thickening in the ethmoid sinuses. Other: Mastoid air cells are clear. IMPRESSION: No CT evidence of acute intracranial abnormality. Moderate chronic microvascular ischemic changes. Small  remote infarct in the right corona radiata. Electronically Signed   By: Donnice Mania M.D.   On: 05/22/2023 12:48    Assessment and Plan Assessment & Plan  1. Pressure injury of right buttock, stage 2 (HCC) (Primary) 2 cm superficial skin breakdown on the right buttock No signs of infection Will instruct nursing staff to put Medihoney and foam dressing daily Instructed patient to reposition every 2 hours  2. Primary hypertension At goal continue with hydralazine   3. Recurrent UTI Patient denies dysuria, lower abdominal pain Follow-up with urology recently Currently on Keflex   4. Atrial fibrillation, unspecified type (HCC) Signs of bleeding Continue with rivaroxaban 

## 2023-12-08 DIAGNOSIS — M25562 Pain in left knee: Secondary | ICD-10-CM | POA: Diagnosis not present

## 2023-12-08 DIAGNOSIS — R29898 Other symptoms and signs involving the musculoskeletal system: Secondary | ICD-10-CM | POA: Diagnosis not present

## 2023-12-08 DIAGNOSIS — R2681 Unsteadiness on feet: Secondary | ICD-10-CM | POA: Diagnosis not present

## 2023-12-10 DIAGNOSIS — M2550 Pain in unspecified joint: Secondary | ICD-10-CM | POA: Diagnosis not present

## 2023-12-10 DIAGNOSIS — R2681 Unsteadiness on feet: Secondary | ICD-10-CM | POA: Diagnosis not present

## 2023-12-10 DIAGNOSIS — M25562 Pain in left knee: Secondary | ICD-10-CM | POA: Diagnosis not present

## 2023-12-10 DIAGNOSIS — M6281 Muscle weakness (generalized): Secondary | ICD-10-CM | POA: Diagnosis not present

## 2023-12-10 DIAGNOSIS — M1990 Unspecified osteoarthritis, unspecified site: Secondary | ICD-10-CM | POA: Diagnosis not present

## 2023-12-10 DIAGNOSIS — R29898 Other symptoms and signs involving the musculoskeletal system: Secondary | ICD-10-CM | POA: Diagnosis not present

## 2023-12-15 DIAGNOSIS — R2681 Unsteadiness on feet: Secondary | ICD-10-CM | POA: Diagnosis not present

## 2023-12-15 DIAGNOSIS — M25562 Pain in left knee: Secondary | ICD-10-CM | POA: Diagnosis not present

## 2023-12-15 DIAGNOSIS — R29898 Other symptoms and signs involving the musculoskeletal system: Secondary | ICD-10-CM | POA: Diagnosis not present

## 2023-12-15 DIAGNOSIS — M1990 Unspecified osteoarthritis, unspecified site: Secondary | ICD-10-CM | POA: Diagnosis not present

## 2023-12-15 DIAGNOSIS — M2550 Pain in unspecified joint: Secondary | ICD-10-CM | POA: Diagnosis not present

## 2023-12-15 DIAGNOSIS — M6281 Muscle weakness (generalized): Secondary | ICD-10-CM | POA: Diagnosis not present

## 2023-12-21 ENCOUNTER — Non-Acute Institutional Stay: Payer: Self-pay | Admitting: Sports Medicine

## 2023-12-21 ENCOUNTER — Encounter: Payer: Self-pay | Admitting: Sports Medicine

## 2023-12-21 DIAGNOSIS — E782 Mixed hyperlipidemia: Secondary | ICD-10-CM | POA: Diagnosis not present

## 2023-12-21 DIAGNOSIS — I4891 Unspecified atrial fibrillation: Secondary | ICD-10-CM | POA: Diagnosis not present

## 2023-12-21 DIAGNOSIS — Z87440 Personal history of urinary (tract) infections: Secondary | ICD-10-CM | POA: Diagnosis not present

## 2023-12-21 DIAGNOSIS — K5900 Constipation, unspecified: Secondary | ICD-10-CM | POA: Diagnosis not present

## 2023-12-21 DIAGNOSIS — D509 Iron deficiency anemia, unspecified: Secondary | ICD-10-CM

## 2023-12-21 DIAGNOSIS — K2100 Gastro-esophageal reflux disease with esophagitis, without bleeding: Secondary | ICD-10-CM

## 2023-12-21 NOTE — Progress Notes (Signed)
 Location:  [facility] Room Number: [facility] ALF [id number] Place of Service:  ALF (13) Provider:  Dr. Sherlynn  PCP: Cleotilde Planas, MD  Patient Care Team: Cleotilde Planas, MD as PCP - General (Family Medicine) Michele Richardson, DO as PCP - Cardiology (Cardiology) Porter Andrez SAUNDERS, PA-C (Inactive) as Physician Assistant (Dermatology)  Extended Emergency Contact Information Primary Emergency Contact: Kanyia, [place], [place] [address] [place] Home Phone: [phone] Work Phone: [phone] Mobile Phone: [phone] Relation: Son Secondary Emergency Contact: Peaden,[name]  [place] Phone: [phone] Relation: Son  Code Status:  DNR Goals of care: Advanced Directive information    11/23/2023   10:28 AM  Advanced Directives  Does Patient Have a Medical Advance Directive? Yes  Type of Estate agent of [place];Out of facility DNR (pink MOST or yellow form)  Does patient want to make changes to medical advance directive? No - Patient declined  Copy of Healthcare Power of Attorney in Chart? Yes - validated most recent copy scanned in chart (See row information)     Chief Complaint  Patient presents with   Medical Management of Chronic Issues    Medical Management of Chronic Issues.     HPI:  Pt is a 83 y.o. female with PMH of Recurrent UTI, A fib, CVA, HTN, IDA, HLD is seen today for medical management of chronic diseases.   Pt seen and examined in her room  She seems pleasant and comfortable and does not appear to be in distress She ambulates with a walker Pt says that she wants to come off some of the medications Reviewed medication list with the patient .  Recurrent UTI  Pt follows with urology  She is currently on estrace cream, hipprex, D mannose Pt denies dysuria, lower abdominal pain, nausea, vomiting  GERD Pt says she still has some acid reflux Currently on carafate ,  famotidine , protonix  Denies bloody or dark stools.    Past Medical History:  Diagnosis Date   CKD (chronic kidney disease), stage II    nephrologist-- dr prescilla  [facility])   Diabetes mellitus type 2, diet-controlled (HCC)    followed by pcp---   (09-20-2019  per pt does not check blood sugar at home)   Fracture of humeral shaft, right, closed 04/25/2022   GERD (gastroesophageal reflux disease)    History of primary hyperparathyroidism    s/p  parathyroidectomy right inferior on 12-02-2012   Humerus fracture 04/25/2022   Hyperlipidemia    Hypertension    followd by nephrologist----  (09-20-2019  per pt had stress test done some time ago , unsure when/ where, thinks told ok)   OA (osteoarthritis)    Osteoporosis    Prosthetic joint infection    followed by dr j. hatcher (ID)   left total shoulder arthroplasty 12/ 2016  ; 12/ 2020  revision with explant joint replacement and hemiarthroplasty;  completed 6 month antibiotic 05/ 2021   Vertigo    Past Surgical History:  Procedure Laterality Date   COLONOSCOPY     DILATATION & CURETTAGE/HYSTEROSCOPY WITH MYOSURE N/A 09/28/2019   Procedure: DILATATION & CURETTAGE/HYSTEROSCOPY WITH MYOSURE;  Surgeon: Gorge Ade, MD;  Location: [facility] [place];  Service: Gynecology;  Laterality: N/A;   EYE SURGERY  yrs ago   laser surgery for retinal tear.  (unilateral , pt unsure which side)   ORIF HUMERUS FRACTURE Right 04/27/2022   Procedure: OPEN REDUCTION INTERNAL  FIXATION (ORIF) PROXIMAL HUMERUS FRACTURE;  Surgeon: Cristy Bonner DASEN, MD;  Location: [facility];  Service: Orthopedics;  Laterality: Right;   PARATHYROIDECTOMY N/A 12/02/2012   Procedure: PARATHYROIDECTOMY with frozen section ;  Surgeon: Krystal CHRISTELLA Spinner, MD;  Location: [facility];  Service: General;  Laterality: N/A;   REVERSE SHOULDER ARTHROPLASTY Right 04/27/2022   Procedure: REVERSE SHOULDER ARTHROPLASTY;  Surgeon: Cristy Bonner DASEN, MD;  Location: [facility];  Service: Orthopedics;   Laterality: Right;   SHOULDER INJECTION Left 07/27/2013   Procedure: SHOULDER INJECTION;  Surgeon: Toribio JULIANNA Chancy, MD;  Location: [facility] OR;  Service: Orthopedics;  Laterality: Left;   STERIOD INJECTION Right 02/14/2015   Procedure: RIGHT SHOULDER CORTISONE INJECTION;  Surgeon: Toribio JULIANNA Chancy, MD;  Location: [facility] OR;  Service: Orthopedics;  Laterality: Right;   TOTAL HIP ARTHROPLASTY Left 07/27/2013   Procedure: TOTAL HIP ARTHROPLASTY ANTERIOR APPROACH;  Surgeon: Toribio JULIANNA Chancy, MD;  Location: [facility];  Service: Orthopedics;  Laterality: Left;   TOTAL SHOULDER ARTHROPLASTY Left 02/14/2015   Procedure: LEFT TOTAL SHOULDER ARTHROPLASTY;  Surgeon: Toribio JULIANNA Chancy, MD;  Location: [facility] OR;  Service: Orthopedics;  Laterality: Left;   TOTAL SHOULDER REVISION Left 02/09/2019   Procedure: TOTAL SHOULDER REVISION;  Surgeon: Cristy Bonner DASEN, MD;  Location: [facility];  Service: Orthopedics;  Laterality: Left;    Allergies  Allergen Reactions   Abaloparatide Other (See Comments)    Burred vision, lightheaded (patient does not recall this in 2024)   Mobic  [Meloxicam ] Hypertension   Crestor [Rosuvastatin] Other (See Comments)    Muscle aches   Norvasc [Amlodipine Besylate] Swelling and Other (See Comments)    Ankle swelling   Simvastatin Other (See Comments)    Muscle aches, high LFTs   Sulfa Antibiotics Rash   Zetia [Ezetimibe] Other (See Comments)    Muscle aches    Outpatient Encounter Medications as of 12/21/2023  Medication Sig   acetaminophen  (TYLENOL ) 325 MG tablet Take 650 mg by mouth 2 (two) times daily. Take Two tablet by mouth twice daily as needed.   atorvastatin  (LIPITOR) 10 MG tablet Take 10 mg by mouth daily.   cloNIDine  (CATAPRES ) 0.1 MG tablet Take 0.1 mg by mouth daily as needed (for hypertension- if systolic b/p is over 160).   CRANBERRY-D MANNOSE PO Give 2 tablets by mouth once daily   cyanocobalamin  1000 MCG tablet Take 1,000 mcg by mouth daily.   diclofenac Sodium (VOLTAREN) 1 % GEL  Apply 2-4 g topically 4 (four) times daily.   docusate sodium  (COLACE) 100 MG capsule Take 100 mg by mouth 2 (two) times daily.   estradiol (ESTRACE) 0.1 MG/GM vaginal cream Place 1 Applicatorful vaginally. Every Tuesday and Thursday   famotidine  (PEPCID ) 20 MG tablet Take 1 tablet (20 mg total) by mouth 2 (two) times daily.   ferrous sulfate  220 (44 Fe) MG/5ML solution Take 7.5 mLs by mouth 3 (three) times a week. Monday, Wednesday, Friday   hydrALAZINE  (APRESOLINE ) 100 MG tablet Take 1 tablet (100 mg total) by mouth 3 (three) times daily.   hydrocortisone (ANUSOL-HC) 2.5 % rectal cream Place 1 Application rectally 2 (two) times daily.   leptospermum manuka honey (MEDIHONEY) gel Apply 1 Application topically daily.   methenamine (HIPREX) 1 g tablet Take 1 g by mouth 2 (two) times daily with a meal.   nystatin-triamcinolone  (MYCOLOG II) cream Apply 1 Application topically.   ondansetron  (ZOFRAN ) 4 MG tablet Take 4 mg by mouth every 8 (eight) hours as needed for nausea or vomiting.  pantoprazole  (PROTONIX ) 40 MG tablet Take 1 tablet (40 mg total) by mouth 2 (two) times daily.   polyethylene glycol powder (MIRALAX ) 17 GM/SCOOP powder Take 17 g by mouth daily. mix in 8 oz water .   potassium chloride  (KLOR-CON  M) 10 MEQ tablet Take 1 tablet (10 mEq total) by mouth daily.   Probiotic Product (PROBIOTIC PEARLS MAX POTENCY) CAPS Take 1 capsule by mouth daily.   Rivaroxaban  (XARELTO ) 15 MG TABS tablet Take 1 tablet (15 mg total) by mouth daily with supper.   senna-docusate (SENOKOT-S) 8.6-50 MG tablet Take 1 tablet by mouth 2 (two) times daily.   sucralfate  (CARAFATE ) 1 GM/10ML suspension Take 10 mLs (1 g total) by mouth 4 (four) times daily -  with meals and at bedtime. Give  1 hour before meals and at bedtime on an empt stomach   vitamin C (ASCORBIC ACID) 250 MG tablet Take 250 mg by mouth 2 (two) times daily.   Zinc Oxide 10 % OINT Apply 1 Application topically See admin instructions. 1 application to  buttocks every shift for redness   No facility-administered encounter medications on file as of 12/21/2023.    Review of Systems  Constitutional:  Negative for fever.  Respiratory:  Negative for cough, shortness of breath and wheezing.   Cardiovascular:  Negative for chest pain and leg swelling.  Gastrointestinal:  Negative for abdominal pain, blood in stool, constipation, diarrhea, nausea and vomiting.  Genitourinary:  Negative for dysuria.  Neurological:  Negative for dizziness.    Immunization History  Administered Date(s) Administered   INFLUENZA, HIGH DOSE SEASONAL PF 03/16/2014, 01/14/2016, 01/21/2017, 02/24/2022   Influenza Split 11/24/2008   Influenza,inj,Quad PF,6+ Mos 11/20/2010, 02/01/2013   Influenza-Unspecified 11/20/2010, 02/01/2013, 02/19/2018, 02/24/2022   Moderna Sars-Covid-2 Vaccination 04/23/2019, 05/21/2019, 02/20/2020   Pneumococcal Conjugate-13 03/16/2014   Pneumococcal Polysaccharide-23 04/28/2005   Td 04/28/2005   Td (Adult) 04/28/2005   Tdap 06/08/2017   Zoster Recombinant(Shingrix) 01/14/2019   Zoster, Live 07/21/2007   Pertinent  Health Maintenance Due  Topic Date Due   HEMOGLOBIN A1C  04/15/2023   FOOT EXAM  02/20/2024   OPHTHALMOLOGY EXAM  04/06/2024   DEXA SCAN  Completed   Influenza Vaccine  Discontinued      04/12/2019    2:21 PM 07/19/2019    2:20 PM 09/28/2019    8:51 AM 06/21/2020    5:59 PM 09/23/2021    1:10 PM  Fall Risk  Falls in the past year? 0  0      (RETIRED) Patient Fall Risk Level Low fall risk  Low fall risk  Moderate fall risk  Low fall risk  Low fall risk   Fall risk Follow up Falls evaluation completed  Falls evaluation completed         Data saved with a previous flowsheet row definition   Functional Status Survey:    Vitals:   12/21/23 1158  BP: (!) 122/58  Pulse: 72  Resp: 18  Temp: (!) 97.5 F (36.4 C)  SpO2: 96%  Weight: 122 lb (55.3 kg)  Height: 5' 4 (1.626 m)   Body mass index is 20.94  kg/m. Physical Exam Constitutional:      Appearance: Normal appearance.  HENT:     Head: Normocephalic and atraumatic.  Cardiovascular:     Rate and Rhythm: Normal rate and regular rhythm.  Pulmonary:     Effort: Pulmonary effort is normal. No respiratory distress.     Breath sounds: Normal breath sounds. No wheezing.  Abdominal:  General: Bowel sounds are normal. There is no distension.     Tenderness: There is no abdominal tenderness. There is no guarding or rebound.     Comments:    Musculoskeletal:        General: No swelling.  Neurological:     Mental Status: She is alert. Mental status is at baseline.     Motor: No weakness.     Labs reviewed: Recent Labs    01/06/23 0926 01/07/23 0425 01/08/23 0413 01/16/23 0000 05/24/23 0701 05/25/23 0603 05/26/23 0540 06/02/23 0000 06/18/23 0000 08/25/23 0000  NA 134* 134* 135   < > 133* 132* 133* 137 131* 137  K 3.6 3.6 3.1*   < > 3.7 3.5 3.3* 3.0* 3.4* 3.9  CL 106 106 106   < > 103 104 106 107 105 109*  CO2 22 19* 22   < > 21* 22 20* 24* 23* 23*  GLUCOSE 115* 89 95   < > 96 104* 93  --   --   --   BUN 14 13 13    < > 12 10 10 14 12 20   CREATININE 0.62 0.65 0.61   < > 0.80 0.79 0.67 0.6 0.9 0.8  CALCIUM  9.9 9.2 9.1   < > 9.7 9.2 9.3 9.8 9.9  --   MG 1.9 2.2 2.4   < > 1.4* 2.1 1.7  --   --   --   PHOS 2.0* 2.6 2.2*  --   --   --   --   --   --   --    < > = values in this interval not displayed.   Recent Labs    12/28/22 1256 01/01/23 0416 01/08/23 0413 01/16/23 0000 05/22/23 1116 06/18/23 0000 08/25/23 0000  AST 17  --  17   < > 16 9* 8*  ALT 14  --  11   < > 7 5* 5*  ALKPHOS 85  --  64   < > 77 89 85  BILITOT 0.8  --  0.6  --  0.8  --   --   PROT 6.5  --  5.6*  --  6.1*  --   --   ALBUMIN  3.0*   < > 2.7*   < > 2.8* 3.3* 3.1*   < > = values in this interval not displayed.   Recent Labs    05/24/23 0701 05/25/23 0603 05/26/23 0540 06/02/23 0000 06/18/23 0000 08/07/23 0000 08/25/23 0000  WBC 7.6 6.7  6.8 4.8 4.9 5.5 4.4  NEUTROABS  --   --   --  2,568.00 2,891.00 3,757.00  --   HGB 10.7* 9.6* 9.2* 9.6* 10.8* 10.7* 9.7*  HCT 33.2* 29.4* 28.8* 30* 33* 34* 31*  MCV 84.3 83.1 84.7  --   --   --   --   PLT 206 183 181 211 198 197 174   Lab Results  Component Value Date   TSH 1.438 05/22/2023   Lab Results  Component Value Date   HGBA1C 6.0 (H) 10/13/2022   Lab Results  Component Value Date   CHOL 121 10/13/2022   HDL 44 10/13/2022   LDLCALC 62 10/13/2022   TRIG 74 10/13/2022   CHOLHDL 2.8 10/13/2022    Significant Diagnostic Results in last 30 days:  No results found.  Assessment/Plan   Recurrent UTI  Denies dysuria Cont with hipprex, D mannose, estrace cream   GERD  Avoid spicy foods Cont with carafate  ,pepcid ,  protonix   IDA Will check cbc Cont with iron supplements  Constipation  Cont with colace, senna  Afib  Cont with xarelto   Rate controlled  HTN  Cont with hydralazine 

## 2023-12-22 DIAGNOSIS — R799 Abnormal finding of blood chemistry, unspecified: Secondary | ICD-10-CM | POA: Diagnosis not present

## 2023-12-22 DIAGNOSIS — M1990 Unspecified osteoarthritis, unspecified site: Secondary | ICD-10-CM | POA: Diagnosis not present

## 2023-12-22 DIAGNOSIS — D518 Other vitamin B12 deficiency anemias: Secondary | ICD-10-CM | POA: Diagnosis not present

## 2023-12-22 DIAGNOSIS — R2681 Unsteadiness on feet: Secondary | ICD-10-CM | POA: Diagnosis not present

## 2023-12-22 DIAGNOSIS — M6281 Muscle weakness (generalized): Secondary | ICD-10-CM | POA: Diagnosis not present

## 2023-12-22 DIAGNOSIS — R29898 Other symptoms and signs involving the musculoskeletal system: Secondary | ICD-10-CM | POA: Diagnosis not present

## 2023-12-22 DIAGNOSIS — N179 Acute kidney failure, unspecified: Secondary | ICD-10-CM | POA: Diagnosis not present

## 2023-12-22 DIAGNOSIS — R296 Repeated falls: Secondary | ICD-10-CM | POA: Diagnosis not present

## 2023-12-22 DIAGNOSIS — E785 Hyperlipidemia, unspecified: Secondary | ICD-10-CM | POA: Diagnosis not present

## 2023-12-22 DIAGNOSIS — M25562 Pain in left knee: Secondary | ICD-10-CM | POA: Diagnosis not present

## 2023-12-22 DIAGNOSIS — M2550 Pain in unspecified joint: Secondary | ICD-10-CM | POA: Diagnosis not present

## 2023-12-22 DIAGNOSIS — I10 Essential (primary) hypertension: Secondary | ICD-10-CM | POA: Diagnosis not present

## 2023-12-24 DIAGNOSIS — M25562 Pain in left knee: Secondary | ICD-10-CM | POA: Diagnosis not present

## 2023-12-24 DIAGNOSIS — M6281 Muscle weakness (generalized): Secondary | ICD-10-CM | POA: Diagnosis not present

## 2023-12-24 DIAGNOSIS — M2550 Pain in unspecified joint: Secondary | ICD-10-CM | POA: Diagnosis not present

## 2023-12-24 DIAGNOSIS — R29898 Other symptoms and signs involving the musculoskeletal system: Secondary | ICD-10-CM | POA: Diagnosis not present

## 2023-12-24 DIAGNOSIS — R2681 Unsteadiness on feet: Secondary | ICD-10-CM | POA: Diagnosis not present

## 2023-12-24 DIAGNOSIS — M1990 Unspecified osteoarthritis, unspecified site: Secondary | ICD-10-CM | POA: Diagnosis not present

## 2024-02-02 ENCOUNTER — Ambulatory Visit: Attending: Cardiology | Admitting: Cardiology

## 2024-02-02 ENCOUNTER — Encounter: Payer: Self-pay | Admitting: Cardiology

## 2024-02-02 VITALS — BP 122/72 | HR 68 | Ht 66.0 in | Wt 117.6 lb

## 2024-02-02 DIAGNOSIS — I480 Paroxysmal atrial fibrillation: Secondary | ICD-10-CM | POA: Diagnosis not present

## 2024-02-02 DIAGNOSIS — D6869 Other thrombophilia: Secondary | ICD-10-CM | POA: Diagnosis not present

## 2024-02-02 DIAGNOSIS — Z7901 Long term (current) use of anticoagulants: Secondary | ICD-10-CM

## 2024-02-02 DIAGNOSIS — E782 Mixed hyperlipidemia: Secondary | ICD-10-CM | POA: Diagnosis not present

## 2024-02-02 DIAGNOSIS — I10 Essential (primary) hypertension: Secondary | ICD-10-CM

## 2024-02-02 NOTE — Patient Instructions (Addendum)
 Medication Instructions:  Your physician recommends that you continue on your current medications as directed. Please refer to the Current Medication list given to you today.  *If you need a refill on your cardiac medications before your next appointment, please call your pharmacy*  Lab Work:  Hemoglobin and Hematocrit and BMET, every 6 months drawn at [facility]  If you have labs (blood work) drawn today and your tests are completely normal, you will receive your results only by: MyChart Message (if you have MyChart) OR A paper copy in the mail If you have any lab test that is abnormal or we need to change your treatment, we will call you to review the results.  Testing/Procedures: None ordered  Follow-Up: At [facility], you and your health needs are our priority.  As part of our continuing mission to provide you with exceptional heart care, our providers are all part of one team.  This team includes your primary Cardiologist (physician) and Advanced Practice Providers or APPs (Physician Assistants and Nurse Practitioners) who all work together to provide you with the care you need, when you need it.  Your next appointment:   1 year(s)  Provider:   Madonna Large, DO    We recommend signing up for the patient portal called MyChart.  Sign up information is provided on this After Visit Summary.  MyChart is used to connect with patients for Virtual Visits (Telemedicine).  Patients are able to view lab/test results, encounter notes, upcoming appointments, etc.  Non-urgent messages can be sent to your provider as well.   To learn more about what you can do with MyChart, go to [email or web address].   Other Instructions Please go to the Hospital for any falls or injuries

## 2024-02-02 NOTE — Progress Notes (Signed)
 Cardiology Office Note:  .   Date:  02/02/2024  ID:  Tina Frank, DOB [birth year], MRN [id number] PCP:  Tina Planas, MD  Former Cardiology Providers: None Nephrologist: Dr. [facility] Providers Cardiologist:  Madonna Large, DO , [facility] (established care 05/24/2022) Electrophysiologist:  None  Click to update primary MD,subspecialty MD or APP then REFRESH:1}    Chief Complaint  Patient presents with   Follow-up    Atrial fibrillation    History of Present Illness: Tina   [name] is a 83 y.o. Caucasian female whose past medical history and cardiovascular risk factors includes: Paroxysmal atrial fibrillation (October 2024), prediabetes, MGUS (monoclonal gammopathy of unknown significance) IgM kappa MGUS, renal mass versus cyst with history of hydronephrosis, hyperlipidemia, hypertension, gout, GERD history of renal cyst/adrenal mass, history of primary hyperparathyroidism, former smoker.   Referred to cardiology for slow heart rate and irregular pulse initially noted by home health care staff.  Clinically she was asymptomatic and at the last office visit recommended to undergo echocardiogram and a Zio patch to evaluate for dysrhythmias.  In October 2024 she was hospitalized for acute encephalopathy, urinary tract infection, AKI, and new onset of atrial fibrillation.  Patient was started on Xarelto  for thromboembolic prophylaxis.  Her aspirin  was discontinued in the interim due to gum bleeding and since then this has resolved.  She remains on Xarelto  for thromboembolic prophylaxis.  At the last office visit patient did not endorse evidence of bleeding and fall precautions were strongly advised.  Patient presents today from [facility] home. Since last office visit she denies anginal chest pain or heart failure symptoms. She has had 1 mechanical fall a week ago where she tripped.  No loss of consciousness. Had superficial bruises according to her and did not go to the  ER. Otherwise she does not endorse evidence of bleeding.  Review of Systems: .   Review of Systems  Cardiovascular:  Negative for chest pain, claudication, irregular heartbeat, leg swelling, near-syncope, orthopnea, palpitations, paroxysmal nocturnal dyspnea and syncope.  Respiratory:  Negative for shortness of breath.   Hematologic/Lymphatic: Negative for bleeding problem.    Studies Reviewed:    Echocardiogram: October 2024: LVEF 60 to 65%. Right ventricular size and function normal. No significant valvular heart disease. Estimated RAP 3 mmHg  RADIOLOGY: NA  Risk Assessment/Calculations:   Click Here to Calculate/Change CHADS2VASc Score The patient's CHADS2-VASc score is 4, indicating a 4.8% annual risk of stroke.    Labs:       Latest Ref Rng & Units 08/25/2023   12:00 AM 08/07/2023   12:00 AM 06/18/2023   12:00 AM  CBC  WBC  4.4     5.5     4.9      Hemoglobin 12.0 - 16.0 9.7     10.7     10.8      Hematocrit 36 - 46 31     34     33      Platelets 150 - 400 K/uL 174     197     198         This result is from an external source.       Latest Ref Rng & Units 08/25/2023   12:00 AM 06/18/2023   12:00 AM 06/02/2023   12:00 AM  BMP  BUN 4 - 21 20     12     14       Creatinine 0.5 - 1.1 0.8  0.9     0.6      Sodium 137 - 147 137     131     137      Potassium 3.5 - 5.1 mEq/L 3.9     3.4     3.0      Chloride 99 - 108 109     105     107      CO2 13 - 22 23     23     24       Calcium  8.7 - 10.7  9.9     9.8         This result is from an external source.      Latest Ref Rng & Units 08/25/2023   12:00 AM 06/18/2023   12:00 AM 06/02/2023   12:00 AM  CMP  BUN 4 - 21 20     12     14       Creatinine 0.5 - 1.1 0.8     0.9     0.6      Sodium 137 - 147 137     131     137      Potassium 3.5 - 5.1 mEq/L 3.9     3.4     3.0      Chloride 99 - 108 109     105     107      CO2 13 - 22 23     23     24       Calcium  8.7 - 10.7  9.9     9.8      Alkaline Phos 25 -  125 85     89       AST 13 - 35 8     9       ALT 7 - 35 U/L 5     5          This result is from an external source.    Lab Results  Component Value Date   CHOL 121 10/13/2022   HDL 44 10/13/2022   LDLCALC 62 10/13/2022   TRIG 74 10/13/2022   CHOLHDL 2.8 10/13/2022   No results for input(s): LIPOA in the last 8760 hours. No components found for: NTPROBNP No results for input(s): PROBNP in the last 8760 hours. Recent Labs    05/22/23 1450  TSH 1.438   External Labs: Collected: 12/22/2023 labs provided by friends at home Total cholesterol 120, HDL 54, triglycerides 81, LDL 50 BUN 21, creatinine 9.29 Sodium 138, potassium 3.5, chloride 106, bicarb 26 AST, ALT, alkaline phosphatase within normal limits. Hemoglobin 9.6, hematocrit 29.6 Creatinine clearance <50 mL/min by CG equation  Physical Exam:    Today's Vitals   02/02/24 1300  BP: 122/72  Pulse: 68  SpO2: 97%  Weight: 117 lb 9.6 oz (53.3 kg)  Height: 5' 6 (1.676 m)   Body mass index is 18.98 kg/m. Wt Readings from Last 3 Encounters:  02/02/24 117 lb 9.6 oz (53.3 kg)  12/21/23 122 lb (55.3 kg)  12/07/23 119 lb 9.6 oz (54.3 kg)    Physical Exam  Constitutional: No distress. She appears chronically ill.  hemodynamically stable, presents in a wheelchair.  Neck: No JVD present.  Cardiovascular: Normal rate, regular rhythm, S1 normal and S2 normal. Exam reveals no gallop, no S3 and no S4.  No murmur heard. Pulmonary/Chest: Effort normal and breath sounds normal. No stridor. She has no wheezes. She  has no rales.  Abdominal: Soft. Bowel sounds are normal. She exhibits no distension. There is no abdominal tenderness.  Musculoskeletal:        General: No edema.     Cervical back: Neck supple.  Neurological: She is alert and oriented to person, place, and time. She has intact cranial nerves (2-12).  Skin: Skin is warm.     Impression & Recommendation(s):  Impression:   ICD-10-CM   1. Paroxysmal atrial  fibrillation (HCC)  I48.0     2. Long term (current) use of anticoagulants  Z79.01     3. Hypercoagulable state due to paroxysmal atrial fibrillation (HCC)  D68.69    I48.0     4. Benign hypertension  I10     5. Mixed hyperlipidemia  E78.2         Recommendation(s):  Paroxysmal atrial fibrillation (HCC) Long term (current) use of anticoagulants Hypercoagulable state due to paroxysmal atrial fibrillation (HCC) Rate control: N/A. Rhythm control: N/A Thromboembolic prophylaxis: Xarelto  Diagnosed in October 2024. Does not endorse evidence of bleeding. Based on the hemoglobin trend she is usually between 9-11 g/dL. Hemoglobin between June and October remains relatively stable Patient had 1 mechanical fall about a week ago with superficial bruises according to her.  For reasons unknown did not go to the ER for further evaluation and management. Risks, benefits, and alternatives to anticoagulation discussed and patient would like to continue with anticoagulation/blood thinners to reduce her risk for stroke. Because she is on a blood thinner patient is advised to go to the ER to rule out internal bleeding if she sustains a fall for her safety.  She verbalizes understanding.   She gets regular blood work at [facility], recommend checking H&H and basic metabolic profile every 6 months to monitor hemoglobin and renal function respectively  Benign hypertension Office blood pressures acceptable. Currently on hydralazine  100 mg p.o. 3 times daily. Takes clonidine  on a as needed basis. Currently managed by her provider at [facility]  Mixed hyperlipidemia Currently on atorvastatin  10 mg p.o. daily.  Most recent labs from October 2025 independently reviewed, provided by [facility] mentioned above for reference Currently managed by primary care provider.  Orders Placed:  No orders of the defined types were placed in this encounter.  Final Medication List:   No orders of the  defined types were placed in this encounter.   There are no discontinued medications.    Current Outpatient Medications:    acetaminophen  (TYLENOL ) 325 MG tablet, Take 650 mg by mouth 2 (two) times daily. Take Two tablet by mouth twice daily as needed., Disp: , Rfl:    atorvastatin  (LIPITOR) 10 MG tablet, Take 10 mg by mouth daily., Disp: , Rfl:    cloNIDine  (CATAPRES ) 0.1 MG tablet, Take 0.1 mg by mouth daily as needed (for hypertension- if systolic b/p is over 160)., Disp: , Rfl:    CRANBERRY-D MANNOSE PO, Give 2 tablets by mouth once daily, Disp: , Rfl:    cyanocobalamin  1000 MCG tablet, Take 1,000 mcg by mouth daily., Disp: , Rfl:    diclofenac Sodium (VOLTAREN) 1 % GEL, Apply 2-4 g topically 4 (four) times daily., Disp: , Rfl:    docusate sodium  (COLACE) 100 MG capsule, Take 100 mg by mouth 2 (two) times daily., Disp: , Rfl:    estradiol (ESTRACE) 0.1 MG/GM vaginal cream, Place 1 Applicatorful vaginally. Every Tuesday and Thursday, Disp: , Rfl:    famotidine  (PEPCID ) 20 MG tablet, Take 1 tablet (20 mg  total) by mouth 2 (two) times daily., Disp: 60 tablet, Rfl: 3   ferrous sulfate  220 (44 Fe) MG/5ML solution, Take 7.5 mLs by mouth 3 (three) times a week. Monday, Wednesday, Friday, Disp: , Rfl:    hydrALAZINE  (APRESOLINE ) 100 MG tablet, Take 1 tablet (100 mg total) by mouth 3 (three) times daily., Disp: , Rfl:    hydrocortisone (ANUSOL-HC) 2.5 % rectal cream, Place 1 Application rectally 2 (two) times daily., Disp: , Rfl:    leptospermum manuka honey (MEDIHONEY) gel, Apply 1 Application topically daily., Disp: , Rfl:    methenamine (HIPREX) 1 g tablet, Take 1 g by mouth 2 (two) times daily with a meal., Disp: , Rfl:    nystatin-triamcinolone  (MYCOLOG II) cream, Apply 1 Application topically., Disp: , Rfl:    ondansetron  (ZOFRAN ) 4 MG tablet, Take 4 mg by mouth every 8 (eight) hours as needed for nausea or vomiting., Disp: , Rfl:    pantoprazole  (PROTONIX ) 40 MG tablet, Take 1 tablet (40 mg  total) by mouth 2 (two) times daily., Disp: 60 tablet, Rfl: 3   polyethylene glycol powder (MIRALAX ) 17 GM/SCOOP powder, Take 17 g by mouth daily. mix in 8 oz water ., Disp: , Rfl:    potassium chloride  (KLOR-CON  M) 10 MEQ tablet, Take 1 tablet (10 mEq total) by mouth daily., Disp: , Rfl:    Probiotic Product (PROBIOTIC PEARLS MAX POTENCY) CAPS, Take 1 capsule by mouth daily., Disp: , Rfl:    Rivaroxaban  (XARELTO ) 15 MG TABS tablet, Take 1 tablet (15 mg total) by mouth daily with supper., Disp: 30 tablet, Rfl: 0   senna-docusate (SENOKOT-S) 8.6-50 MG tablet, Take 1 tablet by mouth 2 (two) times daily., Disp: , Rfl:    sucralfate  (CARAFATE ) 1 GM/10ML suspension, Take 10 mLs (1 g total) by mouth 4 (four) times daily -  with meals and at bedtime. Give  1 hour before meals and at bedtime on an empt stomach, Disp: 473 mL, Rfl: 3   vitamin C (ASCORBIC ACID) 250 MG tablet, Take 250 mg by mouth 2 (two) times daily., Disp: , Rfl:    Zinc Oxide 10 % OINT, Apply 1 Application topically See admin instructions. 1 application to buttocks every shift for redness, Disp: , Rfl:   Consent:   NA  Disposition:   12 months sooner if needed. Patient may be asked to follow-up sooner based on the results of the above-mentioned testing.  Her questions and concerns were addressed to her satisfaction. She voices understanding of the recommendations provided during this encounter.    Signed, Madonna Michele HAS, [facility]  A Division of [facility] [address], [place], [place] [address]   02/02/2024 1:36 PM

## 2024-02-22 ENCOUNTER — Encounter: Payer: Self-pay | Admitting: Nurse Practitioner

## 2024-02-22 ENCOUNTER — Non-Acute Institutional Stay: Payer: Self-pay | Admitting: Nurse Practitioner

## 2024-02-22 DIAGNOSIS — K5901 Slow transit constipation: Secondary | ICD-10-CM

## 2024-02-22 DIAGNOSIS — E785 Hyperlipidemia, unspecified: Secondary | ICD-10-CM

## 2024-02-22 DIAGNOSIS — N390 Urinary tract infection, site not specified: Secondary | ICD-10-CM

## 2024-02-22 DIAGNOSIS — Z8673 Personal history of transient ischemic attack (TIA), and cerebral infarction without residual deficits: Secondary | ICD-10-CM

## 2024-02-22 DIAGNOSIS — N182 Chronic kidney disease, stage 2 (mild): Secondary | ICD-10-CM | POA: Diagnosis not present

## 2024-02-22 DIAGNOSIS — M150 Primary generalized (osteo)arthritis: Secondary | ICD-10-CM

## 2024-02-22 DIAGNOSIS — I4891 Unspecified atrial fibrillation: Secondary | ICD-10-CM | POA: Diagnosis not present

## 2024-02-22 DIAGNOSIS — I10 Essential (primary) hypertension: Secondary | ICD-10-CM

## 2024-02-22 DIAGNOSIS — M109 Gout, unspecified: Secondary | ICD-10-CM

## 2024-02-22 DIAGNOSIS — K219 Gastro-esophageal reflux disease without esophagitis: Secondary | ICD-10-CM

## 2024-02-22 DIAGNOSIS — E1122 Type 2 diabetes mellitus with diabetic chronic kidney disease: Secondary | ICD-10-CM | POA: Diagnosis not present

## 2024-02-22 NOTE — Assessment & Plan Note (Signed)
 resolved difficulty word finding, resumed Statin, off ASA, f/u stroke clinic, MRI small punctate infarct in the posteerior limb of R internal capsule, no focal weakness, also taking Xarelto 

## 2024-02-22 NOTE — Assessment & Plan Note (Signed)
 stable, off Allopurinol 

## 2024-02-22 NOTE — Assessment & Plan Note (Signed)
 Heart rate is controlled, continued Xarelto 

## 2024-02-22 NOTE — Assessment & Plan Note (Signed)
 Hx of UTI oFF Keflex , on methenamine for suppression 11/26/23 on Estrace vaginal cream.

## 2024-02-22 NOTE — Assessment & Plan Note (Addendum)
 Prediabetes Hgb A1c 6.0 10/13/22, TSH 1.293 10/12/22, ACR 242(<30)02/26/23 Diet controlled Will update HgbA1c, CBC differential CMP /EGFR, TSH, lipid panel, vitamin B12, vitamin D 

## 2024-02-22 NOTE — Progress Notes (Signed)
 Location:   [facility] Room Number: 801 Place of Service:  ALF (13) Provider: Larwance Brittaney Beaulieu NP  Cleotilde Planas, MD  Patient Care Team: Cleotilde Planas, MD as PCP - General (Family Medicine) Michele Richardson, DO as PCP - Cardiology (Cardiology) Porter Andrez SAUNDERS, PA-C (Inactive) as Physician Assistant (Dermatology)  Extended Emergency Contact Information Primary Emergency Contact: Julionna, [place], [place] [address] [place] Home Phone: [phone] Work Phone: [phone] Mobile Phone: [phone] Relation: Son Secondary Emergency Contact: Sc[place],[name]  [place] Phone: [phone] Relation: Son  Code Status:  DNR Goals of care: Advanced Directive information    11/23/2023   10:28 AM  Advanced Directives  Does Patient Have a Medical Advance Directive? Yes  Type of Estate Agent of [place];Out of facility DNR (pink MOST or yellow form)  Does patient want to make changes to medical advance directive? No - Patient declined  Copy of Healthcare Power of Attorney in Chart? Yes - validated most recent copy scanned in chart (See row information)     Chief Complaint  Patient presents with   Medical Management of Chronic Issues    HPI:  Pt is a 83 y.o. female seen today for medical management of chronic diseases.     Hemorrhoids, using Anusol              Hospitalized 05/22/23-05/26/23 for AMS, f/u CBC/BMP. Negative CT head/MRI brain and metabolic workup and UA, treated with Rocephin  x3 days anyway. Unclear etiology.                            05/22/23 the patient was found in recliner in near fetus position, repetitively saying put my legs down, but yelling out when assistance attempted. The patient stated she cannot open her eyes when asked to. The patient is oriented to person, place. No noted focal weakness. She is afebrile, no O2 desaturation.                           Hx of UTI, oFF Keflex , on  methenamine for suppression             Hypokalemia, on  Kcl supplement, K 3.9 08/25/23  Hyponatremia, Na 137 08/25/23             GERD, on  Voquezna per GI 03/27/23  03/27/23 GI dc'd Pantoprazole , Famotidine , Carafage 04/01/23 f/u GI, dc'd Voquenza, restarted Pantoprazole , Famotidine , Sucralfate . Followed by GI, MRI and barium swallow study.               Anemia, added Vit B12(Vit B12 273 12/10/22) and Fe 01/15/23. 01/30/23 Cardiology dc'd ASA, Hgb 9.7 08/25/23             New onset Atrial Fibrillation -2D echo from 10/13/2022 with a EF of 60 to 65%, grade 1 DD and mildly dilated left atrium. -Patient with history of bradycardia arrhythmia for which she was sent to cardiology in May 2024. -Patient felt not a great candidate for long-term anticoagulation but fall risk now is lower due to debility per son. -Patient placed on Xarelto  which is preferred medication per family. -Patient has been placed on full dose Lovenox  in anticipation of bone marrow biopsy which was done 01/06/2023.   -Resuming Rivaroxaban  01/07/2023. 03/11/22 cardiology H&H, BMP q 6months  TSH 1.438 05/22/23              Saw oncology for MGUS, negative cone marrow biopsy, moderate risk of progression to IgM myeloma or [place]denstrom macroglobulinemia(19-37% in next 20 years), f/u.              Hydronephrosis: f/u urology for renal mass left 1.9x1.6cm             Hospitalized 12/22/22 for metabolic encephalopathy 2/2 to UTI, AKI, and hypercalcemia.              Dysphagia, f/u ST             Hospitalized 10/12/22-10/17/22 UTI, encephalopathy, acute CVA             OA R+L knee pain, f/u Ortho for Gel inj, ambulates with walker.              CVA resolved difficulty word finding, resumed Statin, off ASA, f/u stroke clinic, MRI small punctate infarct in the posteerior limb of R internal capsule, no focal weakness, also taking Xarelto              Gout, stable, off Allopurinol              HLD, the patient desires to resume Atorvastatin  and  denied any allergic reaction, LDL 69 04/02/23             HTN, on Hydralazine ,  prn Clonidine              CXR 10/14/22 pulm edema/volume overload, not apparent, off prn Furosemide , BNP 950.8 10/14/22, Echo 60-65% EF 10/13/22             Constipation, stable, on Senna, MiraLax , Anusol for hemorrhoids.              Prediabetes Hgb A1c 6.0 10/13/22, TSH 1.293 10/12/22, ACR 242(<30)02/26/23             Positive anti CCP test, f/u Rheumatology              Bradycardia, Hx of, off Carvedilol               Hx of urinary retention, off Tamsulosin .    Past Medical History:  Diagnosis Date   CKD (chronic kidney disease), stage II    nephrologist-- dr prescilla  [facility])   Diabetes mellitus type 2, diet-controlled (HCC)    followed by pcp---   (09-20-2019  per pt does not check blood sugar at home)   Fracture of humeral shaft, right, closed 04/25/2022   GERD (gastroesophageal reflux disease)    History of primary hyperparathyroidism    s/p  parathyroidectomy right inferior on 12-02-2012   Humerus fracture 04/25/2022   Hyperlipidemia    Hypertension    followd by nephrologist----  (09-20-2019  per pt had stress test done some time ago , unsure when/ where, thinks told ok)   OA (osteoarthritis)    Osteoporosis    Prosthetic joint infection    followed by dr j. hatcher (ID)   left total shoulder arthroplasty 12/ 2016  ; 12/ 2020  revision with explant joint replacement and hemiarthroplasty;  completed 6 month antibiotic 05/ 2021   Vertigo    Past Surgical History:  Procedure Laterality Date   COLONOSCOPY     DILATATION & CURETTAGE/HYSTEROSCOPY WITH MYOSURE N/A 09/28/2019   Procedure: DILATATION & CURETTAGE/HYSTEROSCOPY WITH MYOSURE;  Surgeon: Gorge Ade, MD;  Location: [facility] [place];  Service: Gynecology;  Laterality: N/A;   EYE  SURGERY  yrs ago   laser surgery for retinal tear.  (unilateral , pt unsure which side)   ORIF HUMERUS FRACTURE Right 04/27/2022   Procedure: OPEN  REDUCTION INTERNAL FIXATION (ORIF) PROXIMAL HUMERUS FRACTURE;  Surgeon: Cristy Bonner DASEN, MD;  Location: [facility];  Service: Orthopedics;  Laterality: Right;   PARATHYROIDECTOMY N/A 12/02/2012   Procedure: PARATHYROIDECTOMY with frozen section ;  Surgeon: Krystal CHRISTELLA Spinner, MD;  Location: [facility];  Service: General;  Laterality: N/A;   REVERSE SHOULDER ARTHROPLASTY Right 04/27/2022   Procedure: REVERSE SHOULDER ARTHROPLASTY;  Surgeon: Cristy Bonner DASEN, MD;  Location: [facility];  Service: Orthopedics;  Laterality: Right;   SHOULDER INJECTION Left 07/27/2013   Procedure: SHOULDER INJECTION;  Surgeon: Toribio JULIANNA Chancy, MD;  Location: [facility] OR;  Service: Orthopedics;  Laterality: Left;   STERIOD INJECTION Right 02/14/2015   Procedure: RIGHT SHOULDER CORTISONE INJECTION;  Surgeon: Toribio JULIANNA Chancy, MD;  Location: [facility] OR;  Service: Orthopedics;  Laterality: Right;   TOTAL HIP ARTHROPLASTY Left 07/27/2013   Procedure: TOTAL HIP ARTHROPLASTY ANTERIOR APPROACH;  Surgeon: Toribio JULIANNA Chancy, MD;  Location: [facility] OR;  Service: Orthopedics;  Laterality: Left;   TOTAL SHOULDER ARTHROPLASTY Left 02/14/2015   Procedure: LEFT TOTAL SHOULDER ARTHROPLASTY;  Surgeon: Toribio JULIANNA Chancy, MD;  Location: [facility] OR;  Service: Orthopedics;  Laterality: Left;   TOTAL SHOULDER REVISION Left 02/09/2019   Procedure: TOTAL SHOULDER REVISION;  Surgeon: Cristy Bonner DASEN, MD;  Location: [facility];  Service: Orthopedics;  Laterality: Left;    Allergies[1]  Allergies as of 02/22/2024       Reactions   Abaloparatide Other (See Comments)   Burred vision, lightheaded (patient does not recall this in 2024)   Mobic  [meloxicam ] Hypertension   Crestor [rosuvastatin] Other (See Comments)   Muscle aches   Norvasc [amlodipine Besylate] Swelling, Other (See Comments)   Ankle swelling   Simvastatin Other (See Comments)   Muscle aches, high LFTs   Sulfa Antibiotics Rash   Zetia [ezetimibe] Other (See Comments)   Muscle aches        Medication List        Accurate as  of February 22, 2024  4:22 PM. If you have any questions, ask your nurse or doctor.          acetaminophen  325 MG tablet Commonly known as: TYLENOL  Take 650 mg by mouth 2 (two) times daily. Take Two tablet by mouth twice daily as needed.   atorvastatin  10 MG tablet Commonly known as: LIPITOR Take 10 mg by mouth daily.   cloNIDine  0.1 MG tablet Commonly known as: CATAPRES  Take 0.1 mg by mouth daily as needed (for hypertension- if systolic b/p is over 160).   CRANBERRY-D MANNOSE PO Give 2 tablets by mouth once daily   cyanocobalamin  1000 MCG tablet Take 1,000 mcg by mouth daily.   diclofenac Sodium 1 % Gel Commonly known as: VOLTAREN Apply 2-4 g topically 4 (four) times daily.   docusate sodium  100 MG capsule Commonly known as: COLACE Take 100 mg by mouth 2 (two) times daily.   estradiol 0.1 MG/GM vaginal cream Commonly known as: ESTRACE Place 1 Applicatorful vaginally. Every Tuesday and Thursday   famotidine  20 MG tablet Commonly known as: PEPCID  Take 1 tablet (20 mg total) by mouth 2 (two) times daily.   ferrous sulfate  220 (44 Fe) MG/5ML solution Take 7.5 mLs by mouth 3 (three) times a week. Monday, Wednesday, Friday   hydrALAZINE  100 MG tablet Commonly known as: APRESOLINE  Take 1 tablet (  100 mg total) by mouth 3 (three) times daily.   hydrocortisone 2.5 % rectal cream Commonly known as: ANUSOL-HC Place 1 Application rectally 2 (two) times daily.   leptospermum manuka honey gel Apply 1 Application topically daily.   methenamine 1 g tablet Commonly known as: HIPREX Take 1 g by mouth 2 (two) times daily with a meal.   MiraLax  17 GM/SCOOP powder Generic drug: polyethylene glycol powder Take 17 g by mouth daily. mix in 8 oz water .   nystatin-triamcinolone  cream Commonly known as: MYCOLOG II Apply 1 Application topically.   ondansetron  4 MG tablet Commonly known as: ZOFRAN  Take 4 mg by mouth every 8 (eight) hours as needed for nausea or vomiting.    pantoprazole  40 MG tablet Commonly known as: PROTONIX  Take 1 tablet (40 mg total) by mouth 2 (two) times daily.   potassium chloride  10 MEQ tablet Commonly known as: KLOR-CON  M Take 1 tablet (10 mEq total) by mouth daily.   Probiotic Pearls Max Potency Caps Take 1 capsule by mouth daily.   Rivaroxaban  15 MG Tabs tablet Commonly known as: XARELTO  Take 1 tablet (15 mg total) by mouth daily with supper.   senna-docusate 8.6-50 MG tablet Commonly known as: Senokot-S Take 1 tablet by mouth 2 (two) times daily.   sucralfate  1 GM/10ML suspension Commonly known as: Carafate  Take 10 mLs (1 g total) by mouth 4 (four) times daily -  with meals and at bedtime. Give  1 hour before meals and at bedtime on an empt stomach   vitamin C 250 MG tablet Commonly known as: ASCORBIC ACID Take 250 mg by mouth 2 (two) times daily.   Zinc Oxide 10 % Oint Apply 1 Application topically See admin instructions. 1 application to buttocks every shift for redness        Review of Systems  Constitutional:  Negative for appetite change, fatigue and fever.  HENT:  Positive for hearing loss. Negative for congestion and trouble swallowing.   Eyes:  Negative for visual disturbance.  Respiratory:  Negative for cough and shortness of breath.   Cardiovascular:  Negative for leg swelling.  Gastrointestinal:  Negative for abdominal pain and constipation.       GERD symptoms intermittently  Genitourinary:  Positive for frequency. Negative for difficulty urinating, dysuria and urgency.       Nocturnal urination 2x average.  Incontinent of urine occasionally   Musculoskeletal:  Positive for arthralgias and gait problem.       R+L knee pain, R+L shoulder limited overhead ROM, c/o pain in the R shoulder, mild.   Skin:  Negative for color change.  Neurological:  Negative for speech difficulty, weakness and headaches.  Psychiatric/Behavioral:  Positive for sleep disturbance. Negative for confusion. The patient is  not nervous/anxious.        Sometimes not sleeping, declined sleeping aid.     Immunization History  Administered Date(s) Administered   INFLUENZA, HIGH DOSE SEASONAL PF 03/16/2014, 01/14/2016, 01/21/2017, 02/24/2022   Influenza Split 11/24/2008   Influenza,inj,Quad PF,6+ Mos 11/20/2010, 02/01/2013   Influenza-Unspecified 11/20/2010, 02/01/2013, 02/19/2018, 02/24/2022   Moderna Sars-Covid-2 Vaccination 04/23/2019, 05/21/2019, 02/20/2020   Pneumococcal Conjugate-13 03/16/2014   Pneumococcal Polysaccharide-23 04/28/2005   Td 04/28/2005   Td (Adult) 04/28/2005   Tdap 06/08/2017   Zoster Recombinant(Shingrix) 01/14/2019   Zoster, Live 07/21/2007   Pertinent  Health Maintenance Due  Topic Date Due   HEMOGLOBIN A1C  04/15/2023   FOOT EXAM  02/20/2024   OPHTHALMOLOGY EXAM  04/06/2024   Bone  Density Scan  Completed   Influenza Vaccine  Discontinued      04/12/2019    2:21 PM 07/19/2019    2:20 PM 09/28/2019    8:51 AM 06/21/2020    5:59 PM 09/23/2021    1:10 PM  Fall Risk  Falls in the past year? 0  0      (RETIRED) Patient Fall Risk Level Low fall risk  Low fall risk  Moderate fall risk  Low fall risk  Low fall risk   Fall risk Follow up Falls evaluation completed  Falls evaluation completed         Data saved with a previous flowsheet row definition   Functional Status Survey:    Vitals:   02/22/24 1533  BP: (!) 120/58  Pulse: 72  Resp: 18  Temp: 98.4 F (36.9 C)  SpO2: 96%  Weight: 118 lb 6.4 oz (53.7 kg)   Body mass index is 19.11 kg/m. Physical Exam Vitals and nursing note reviewed.  Constitutional:      Appearance: Normal appearance.  HENT:     Head: Normocephalic and atraumatic.     Right Ear: Tympanic membrane, ear canal and external ear normal. There is no impacted cerumen.     Left Ear: External ear normal. There is impacted cerumen.     Nose: Nose normal.     Mouth/Throat:     Mouth: Mucous membranes are moist.  Eyes:     Extraocular Movements:  Extraocular movements intact.     Conjunctiva/sclera: Conjunctivae normal.     Pupils: Pupils are equal, round, and reactive to light.  Cardiovascular:     Rate and Rhythm: Normal rate and regular rhythm.     Heart sounds: No murmur heard. Pulmonary:     Effort: Pulmonary effort is normal.     Breath sounds: Rales present. No wheezing or rhonchi.     Comments: Bibasilar rales.  Abdominal:     Palpations: Abdomen is soft.     Tenderness: There is no abdominal tenderness.  Genitourinary:    Comments: External hemorrhoid 7pm, no bleeding upon my examination.  Musculoskeletal:        General: No tenderness.     Cervical back: Normal range of motion and neck supple.     Right lower leg: No edema.     Left lower leg: No edema.     Comments: R+L shoulder limited overhead ROM, mild R shoulder pain. S/p R+L shoulder replacement.   Skin:    General: Skin is warm and dry.     Findings: Erythema present.     Comments: Sacral coccyx redness, itching, blanchable.   Neurological:     General: No focal deficit present.     Mental Status: She is alert and oriented to person, place, and time. Mental status is at baseline.     Gait: Gait abnormal.     Comments: Ambulates with walker.   Psychiatric:        Mood and Affect: Mood normal.        Behavior: Behavior normal.        Thought Content: Thought content normal.     Labs reviewed: Recent Labs    05/24/23 0701 05/25/23 0603 05/26/23 0540 06/02/23 0000 06/18/23 0000 08/25/23 0000  NA 133* 132* 133* 137 131* 137  K 3.7 3.5 3.3* 3.0* 3.4* 3.9  CL 103 104 106 107 105 109*  CO2 21* 22 20* 24* 23* 23*  GLUCOSE 96 104* 93  --   --   --  BUN 12 10 10 14 12 20   CREATININE 0.80 0.79 0.67 0.6 0.9 0.8  CALCIUM  9.7 9.2 9.3 9.8 9.9  --   MG 1.4* 2.1 1.7  --   --   --    Recent Labs    05/22/23 1116 06/18/23 0000 08/25/23 0000  AST 16 9* 8*  ALT 7 5* 5*  ALKPHOS 77 89 85  BILITOT 0.8  --   --   PROT 6.1*  --   --   ALBUMIN  2.8*  3.3* 3.1*   Recent Labs    05/24/23 0701 05/25/23 0603 05/26/23 0540 06/02/23 0000 06/18/23 0000 08/07/23 0000 08/25/23 0000  WBC 7.6 6.7 6.8 4.8 4.9 5.5 4.4  NEUTROABS  --   --   --  2,568.00 2,891.00 3,757.00  --   HGB 10.7* 9.6* 9.2* 9.6* 10.8* 10.7* 9.7*  HCT 33.2* 29.4* 28.8* 30* 33* 34* 31*  MCV 84.3 83.1 84.7  --   --   --   --   PLT 206 183 181 211 198 197 174   Lab Results  Component Value Date   TSH 1.438 05/22/2023   Lab Results  Component Value Date   HGBA1C 6.0 (H) 10/13/2022   Lab Results  Component Value Date   CHOL 121 10/13/2022   HDL 44 10/13/2022   LDLCALC 62 10/13/2022   TRIG 74 10/13/2022   CHOLHDL 2.8 10/13/2022    Significant Diagnostic Results in last 30 days:  No results found.  Assessment/Plan  Osteoarthritis, multiple sites Mostly in shoulders and the left knee Wants to continue therapy Ambulates with walker  History of stroke  resolved difficulty word finding, resumed Statin, off ASA, f/u stroke clinic, MRI small punctate infarct in the posteerior limb of R internal capsule, no focal weakness, also taking Xarelto   Gout  stable, off Allopurinol   Hyperlipidemia  the patient desires to resume Atorvastatin  and denied any allergic reaction, LDL 69 04/02/23  Essential hypertension Blood pressure is controlled  on Hydralazine ,  prn Clonidine   Slow transit constipation  stable, on Senna, MiraLax , Anusol for hemorrhoids.   DM (diabetes mellitus) (HCC) Prediabetes Hgb A1c 6.0 10/13/22, TSH 1.293 10/12/22, ACR 242(<30)02/26/23 Diet controlled  Atrial fibrillation (HCC) Heart rate is controlled, continued Xarelto   Gastroesophageal reflux disease Stabilized Continue Pantoprazole , Famotidine , Sucralfate   UTI (urinary tract infection) Hx of UTI oFF Keflex , on methenamine for suppression 11/26/23 on Estrace vaginal cream.    Family/ staff Communication: Plan of care reviewed with the patient and charge nurse  Labs/tests ordered:  HgbA1c, CBC differential CMP /EGFR, TSH, lipid panel, vitamin B12, vitamin D        [1]  Allergies Allergen Reactions   Abaloparatide Other (See Comments)    Burred vision, lightheaded (patient does not recall this in 2024)   Mobic  [Meloxicam ] Hypertension   Crestor [Rosuvastatin] Other (See Comments)    Muscle aches   Norvasc [Amlodipine Besylate] Swelling and Other (See Comments)    Ankle swelling   Simvastatin Other (See Comments)    Muscle aches, high LFTs   Sulfa Antibiotics Rash   Zetia [Ezetimibe] Other (See Comments)    Muscle aches

## 2024-02-22 NOTE — Assessment & Plan Note (Signed)
 the patient desires to resume Atorvastatin  and denied any allergic reaction, LDL 69 04/02/23

## 2024-02-22 NOTE — Assessment & Plan Note (Signed)
 Stabilized Continue Pantoprazole , Famotidine , Sucralfate 

## 2024-02-22 NOTE — Assessment & Plan Note (Signed)
 Mostly in shoulders and the left knee Wants to continue therapy Ambulates with walker

## 2024-02-22 NOTE — Assessment & Plan Note (Signed)
 Blood pressure is controlled  on Hydralazine ,  prn Clonidine 

## 2024-02-22 NOTE — Assessment & Plan Note (Signed)
 stable, on Senna, MiraLax , Anusol for hemorrhoids.
